# Patient Record
Sex: Female | Born: 1935
Health system: Southern US, Community
[De-identification: ages and names within clinical notes are randomized; demographics above are authoritative.]

## PROBLEM LIST (undated history)

## (undated) DIAGNOSIS — M5136 Other intervertebral disc degeneration, lumbar region: Secondary | ICD-10-CM

## (undated) DIAGNOSIS — M5410 Radiculopathy, site unspecified: Secondary | ICD-10-CM

## (undated) DIAGNOSIS — Z950 Presence of cardiac pacemaker: Secondary | ICD-10-CM

## (undated) DIAGNOSIS — M5116 Intervertebral disc disorders with radiculopathy, lumbar region: Secondary | ICD-10-CM

## (undated) DIAGNOSIS — M810 Age-related osteoporosis without current pathological fracture: Secondary | ICD-10-CM

## (undated) DIAGNOSIS — H25019 Cortical age-related cataract, unspecified eye: Secondary | ICD-10-CM

## (undated) DIAGNOSIS — D72819 Decreased white blood cell count, unspecified: Secondary | ICD-10-CM

## (undated) DIAGNOSIS — K219 Gastro-esophageal reflux disease without esophagitis: Secondary | ICD-10-CM

## (undated) DIAGNOSIS — C160 Malignant neoplasm of cardia: Secondary | ICD-10-CM

## (undated) DIAGNOSIS — R079 Chest pain, unspecified: Secondary | ICD-10-CM

## (undated) DIAGNOSIS — M5430 Sciatica, unspecified side: Secondary | ICD-10-CM

## (undated) DIAGNOSIS — K259 Gastric ulcer, unspecified as acute or chronic, without hemorrhage or perforation: Secondary | ICD-10-CM

## (undated) DIAGNOSIS — F419 Anxiety disorder, unspecified: Secondary | ICD-10-CM

## (undated) DIAGNOSIS — N183 Chronic kidney disease, stage 3 unspecified: Secondary | ICD-10-CM

## (undated) DIAGNOSIS — I10 Essential (primary) hypertension: Secondary | ICD-10-CM

## (undated) DIAGNOSIS — I4891 Unspecified atrial fibrillation: Secondary | ICD-10-CM

## (undated) DIAGNOSIS — R112 Nausea with vomiting, unspecified: Secondary | ICD-10-CM

## (undated) DIAGNOSIS — N200 Calculus of kidney: Secondary | ICD-10-CM

## (undated) DIAGNOSIS — I5030 Unspecified diastolic (congestive) heart failure: Secondary | ICD-10-CM

## (undated) DIAGNOSIS — I2510 Atherosclerotic heart disease of native coronary artery without angina pectoris: Secondary | ICD-10-CM

## (undated) DIAGNOSIS — M1990 Unspecified osteoarthritis, unspecified site: Secondary | ICD-10-CM

## (undated) DIAGNOSIS — I6529 Occlusion and stenosis of unspecified carotid artery: Secondary | ICD-10-CM

## (undated) DIAGNOSIS — Z9889 Other specified postprocedural states: Secondary | ICD-10-CM

## (undated) DIAGNOSIS — J45909 Unspecified asthma, uncomplicated: Secondary | ICD-10-CM

## (undated) DIAGNOSIS — E119 Type 2 diabetes mellitus without complications: Secondary | ICD-10-CM

## (undated) DIAGNOSIS — I739 Peripheral vascular disease, unspecified: Secondary | ICD-10-CM

## (undated) DIAGNOSIS — C4490 Unspecified malignant neoplasm of skin, unspecified: Secondary | ICD-10-CM

## (undated) DIAGNOSIS — D509 Iron deficiency anemia, unspecified: Secondary | ICD-10-CM

## (undated) DIAGNOSIS — K635 Polyp of colon: Secondary | ICD-10-CM

## (undated) DIAGNOSIS — I350 Nonrheumatic aortic (valve) stenosis: Secondary | ICD-10-CM

## (undated) DIAGNOSIS — R011 Cardiac murmur, unspecified: Secondary | ICD-10-CM

## (undated) DIAGNOSIS — Z7901 Long term (current) use of anticoagulants: Secondary | ICD-10-CM

## (undated) DIAGNOSIS — C169 Malignant neoplasm of stomach, unspecified: Secondary | ICD-10-CM

## (undated) DIAGNOSIS — D649 Anemia, unspecified: Secondary | ICD-10-CM

## (undated) DIAGNOSIS — I272 Pulmonary hypertension, unspecified: Secondary | ICD-10-CM

## (undated) DIAGNOSIS — C3491 Malignant neoplasm of unspecified part of right bronchus or lung: Secondary | ICD-10-CM

## (undated) DIAGNOSIS — D002 Carcinoma in situ of stomach: Secondary | ICD-10-CM

## (undated) DIAGNOSIS — Z952 Presence of prosthetic heart valve: Secondary | ICD-10-CM

## (undated) DIAGNOSIS — D519 Vitamin B12 deficiency anemia, unspecified: Secondary | ICD-10-CM

## (undated) DIAGNOSIS — Z889 Allergy status to unspecified drugs, medicaments and biological substances status: Secondary | ICD-10-CM

## (undated) DIAGNOSIS — R109 Unspecified abdominal pain: Secondary | ICD-10-CM

## (undated) DIAGNOSIS — Z8719 Personal history of other diseases of the digestive system: Secondary | ICD-10-CM

## (undated) DIAGNOSIS — D3610 Benign neoplasm of peripheral nerves and autonomic nervous system, unspecified: Secondary | ICD-10-CM

## (undated) DIAGNOSIS — Z8673 Personal history of transient ischemic attack (TIA), and cerebral infarction without residual deficits: Secondary | ICD-10-CM

## (undated) DIAGNOSIS — Z87442 Personal history of urinary calculi: Secondary | ICD-10-CM

## (undated) DIAGNOSIS — K2960 Other gastritis without bleeding: Secondary | ICD-10-CM

## (undated) DIAGNOSIS — D3A8 Other benign neuroendocrine tumors: Secondary | ICD-10-CM

## (undated) DIAGNOSIS — N289 Disorder of kidney and ureter, unspecified: Secondary | ICD-10-CM

## (undated) DIAGNOSIS — E785 Hyperlipidemia, unspecified: Secondary | ICD-10-CM

## (undated) DIAGNOSIS — C3431 Malignant neoplasm of lower lobe, right bronchus or lung: Secondary | ICD-10-CM

## (undated) DIAGNOSIS — D696 Thrombocytopenia, unspecified: Secondary | ICD-10-CM

## (undated) DIAGNOSIS — D508 Other iron deficiency anemias: Secondary | ICD-10-CM

## (undated) HISTORY — DX: Other iron deficiency anemias: D50.8

## (undated) HISTORY — DX: Malignant neoplasm of cardia: C16.0

## (undated) HISTORY — DX: Thrombocytopenia, unspecified: D69.6

## (undated) HISTORY — PX: VAGINAL HYSTERECTOMY: SUR661

## (undated) HISTORY — DX: Malignant neoplasm of lower lobe, right bronchus or lung: C34.31

## (undated) HISTORY — DX: Radiculopathy, site unspecified: M54.10

## (undated) HISTORY — DX: Decreased white blood cell count, unspecified: D72.819

## (undated) HISTORY — DX: Gastro-esophageal reflux disease without esophagitis: K21.9

## (undated) HISTORY — DX: Nonrheumatic aortic (valve) stenosis: I35.0

## (undated) HISTORY — PX: SHOULDER ARTHROSCOPY W/ CAPSULAR REPAIR: SHX2398

## (undated) HISTORY — PX: FRACTURE SURGERY: SHX138

## (undated) HISTORY — DX: Intervertebral disc disorders with radiculopathy, lumbar region: M51.16

## (undated) HISTORY — PX: SKIN CANCER EXCISION: SHX779

## (undated) HISTORY — PX: TUBAL LIGATION: SHX77

## (undated) HISTORY — DX: Unspecified malignant neoplasm of skin, unspecified: C44.90

## (undated) HISTORY — DX: Other intervertebral disc degeneration, lumbar region: M51.36

## (undated) HISTORY — PX: CARDIAC VALVE REPLACEMENT: SHX585

## (undated) HISTORY — DX: Atherosclerotic heart disease of native coronary artery without angina pectoris: I25.10

## (undated) HISTORY — PX: CARDIAC CATHETERIZATION: SHX172

## (undated) HISTORY — DX: Other benign neuroendocrine tumors: D3A.8

## (undated) HISTORY — DX: Hyperlipidemia, unspecified: E78.5

## (undated) HISTORY — DX: Occlusion and stenosis of unspecified carotid artery: I65.29

## (undated) HISTORY — DX: Carcinoma in situ of stomach: D00.2

## (undated) HISTORY — DX: Anemia, unspecified: D64.9

## (undated) HISTORY — PX: APPENDECTOMY: SHX54

## (undated) HISTORY — PX: COLONOSCOPY: SHX174

## (undated) HISTORY — PX: WRIST FRACTURE SURGERY: SHX121

## (undated) HISTORY — PX: ESOPHAGOGASTRODUODENOSCOPY: SHX1529

---
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
## 2004-04-18 ENCOUNTER — Emergency Department: Payer: Self-pay | Admitting: Emergency Medicine

## 2004-04-19 ENCOUNTER — Ambulatory Visit: Payer: Self-pay | Admitting: Emergency Medicine

## 2004-06-27 ENCOUNTER — Ambulatory Visit: Payer: Self-pay | Admitting: Internal Medicine

## 2005-02-16 ENCOUNTER — Ambulatory Visit: Payer: Self-pay | Admitting: Internal Medicine

## 2005-03-09 ENCOUNTER — Ambulatory Visit: Payer: Self-pay | Admitting: Internal Medicine

## 2005-04-10 ENCOUNTER — Ambulatory Visit: Payer: Self-pay | Admitting: Internal Medicine

## 2005-05-10 ENCOUNTER — Ambulatory Visit: Payer: Self-pay | Admitting: Internal Medicine

## 2005-07-19 ENCOUNTER — Ambulatory Visit: Payer: Self-pay | Admitting: Internal Medicine

## 2006-08-29 ENCOUNTER — Ambulatory Visit: Payer: Self-pay | Admitting: Internal Medicine

## 2007-09-10 ENCOUNTER — Ambulatory Visit: Payer: Self-pay | Admitting: Internal Medicine

## 2008-01-13 ENCOUNTER — Ambulatory Visit: Payer: Self-pay | Admitting: Gastroenterology

## 2008-05-13 ENCOUNTER — Emergency Department: Payer: Self-pay

## 2008-09-17 ENCOUNTER — Ambulatory Visit: Payer: Self-pay | Admitting: General Practice

## 2008-10-01 ENCOUNTER — Ambulatory Visit: Payer: Self-pay | Admitting: General Practice

## 2009-03-07 ENCOUNTER — Ambulatory Visit: Payer: Self-pay | Admitting: Internal Medicine

## 2009-03-09 ENCOUNTER — Ambulatory Visit: Payer: Self-pay | Admitting: Gynecologic Oncology

## 2009-03-10 ENCOUNTER — Ambulatory Visit: Payer: Self-pay | Admitting: Internal Medicine

## 2009-03-14 ENCOUNTER — Ambulatory Visit: Payer: Self-pay | Admitting: Gynecologic Oncology

## 2009-05-03 ENCOUNTER — Ambulatory Visit: Payer: Self-pay | Admitting: Gynecologic Oncology

## 2009-05-11 ENCOUNTER — Ambulatory Visit: Payer: Self-pay | Admitting: Unknown Physician Specialty

## 2009-05-17 ENCOUNTER — Ambulatory Visit: Payer: Self-pay | Admitting: Unknown Physician Specialty

## 2010-01-25 ENCOUNTER — Ambulatory Visit: Payer: Self-pay | Admitting: Internal Medicine

## 2011-01-30 ENCOUNTER — Ambulatory Visit: Payer: Self-pay | Admitting: Internal Medicine

## 2011-04-30 ENCOUNTER — Other Ambulatory Visit: Payer: Self-pay

## 2011-04-30 LAB — COMPREHENSIVE METABOLIC PANEL
Albumin: 4 g/dL (ref 3.4–5.0)
Alkaline Phosphatase: 72 U/L (ref 50–136)
Anion Gap: 8 (ref 7–16)
BUN: 13 mg/dL (ref 7–18)
Bilirubin,Total: 0.6 mg/dL (ref 0.2–1.0)
Calcium, Total: 9.4 mg/dL (ref 8.5–10.1)
Creatinine: 0.82 mg/dL (ref 0.60–1.30)
EGFR (African American): >60
Glucose: 114 mg/dL — ABNORMAL HIGH (ref 65–99)
Osmolality: 284 (ref 275–301)
Potassium: 4.5 mmol/L (ref 3.5–5.1)
Sodium: 142 mmol/L (ref 136–145)
Total Protein: 7.7 g/dL (ref 6.4–8.2)

## 2011-04-30 LAB — LIPASE, BLOOD: Lipase: 73 U/L (ref 73–393)

## 2011-05-04 ENCOUNTER — Ambulatory Visit: Payer: Self-pay

## 2011-05-04 ENCOUNTER — Emergency Department: Payer: Self-pay | Admitting: Emergency Medicine

## 2011-05-07 ENCOUNTER — Ambulatory Visit: Payer: Self-pay | Admitting: Gastroenterology

## 2011-05-08 ENCOUNTER — Other Ambulatory Visit: Payer: Self-pay | Admitting: Gastroenterology

## 2011-05-11 LAB — CLOSTRIDIUM DIFFICILE BY PCR

## 2011-05-22 ENCOUNTER — Ambulatory Visit: Payer: Self-pay | Admitting: Gastroenterology

## 2011-05-24 LAB — PATHOLOGY REPORT

## 2011-06-15 ENCOUNTER — Ambulatory Visit: Payer: Self-pay | Admitting: Gastroenterology

## 2011-06-19 ENCOUNTER — Ambulatory Visit: Payer: Self-pay | Admitting: Gastroenterology

## 2011-08-28 ENCOUNTER — Ambulatory Visit: Payer: Self-pay | Admitting: Gastroenterology

## 2011-08-29 LAB — PATHOLOGY REPORT

## 2011-10-04 ENCOUNTER — Ambulatory Visit: Payer: Self-pay | Admitting: Internal Medicine

## 2011-10-15 ENCOUNTER — Emergency Department: Payer: Self-pay | Admitting: Emergency Medicine

## 2011-10-15 LAB — URINALYSIS, COMPLETE
Bacteria: NONE SEEN
Blood: NEGATIVE
Ph: 8 (ref 4.5–8.0)
Protein: NEGATIVE
RBC,UR: <1 /HPF (ref 0–5)
Squamous Epithelial: <1

## 2011-10-15 LAB — COMPREHENSIVE METABOLIC PANEL
Alkaline Phosphatase: 103 U/L (ref 50–136)
Anion Gap: 8 (ref 7–16)
Bilirubin,Total: 0.8 mg/dL (ref 0.2–1.0)
Chloride: 104 mmol/L (ref 98–107)
Co2: 27 mmol/L (ref 21–32)
Creatinine: 0.71 mg/dL (ref 0.60–1.30)
EGFR (Non-African Amer.): >60
Potassium: 4.1 mmol/L (ref 3.5–5.1)
SGPT (ALT): 25 U/L

## 2011-10-15 LAB — CBC
MCH: 29.9 pg (ref 26.0–34.0)
MCHC: 34.6 g/dL (ref 32.0–36.0)
Platelet: 139 10*3/uL — ABNORMAL LOW (ref 150–440)
RBC: 4.49 10*6/uL (ref 3.80–5.20)
WBC: 7.4 10*3/uL (ref 3.6–11.0)

## 2011-10-15 LAB — LIPASE, BLOOD: Lipase: 98 U/L (ref 73–393)

## 2011-10-23 ENCOUNTER — Ambulatory Visit: Payer: Self-pay | Admitting: Gastroenterology

## 2011-10-26 ENCOUNTER — Ambulatory Visit: Payer: Self-pay | Admitting: Internal Medicine

## 2011-11-08 ENCOUNTER — Ambulatory Visit: Payer: Self-pay | Admitting: Internal Medicine

## 2011-12-21 ENCOUNTER — Ambulatory Visit: Payer: Self-pay | Admitting: Physical Medicine and Rehabilitation

## 2012-02-25 ENCOUNTER — Ambulatory Visit: Payer: Self-pay | Admitting: Internal Medicine

## 2012-04-09 ENCOUNTER — Ambulatory Visit: Payer: Self-pay | Admitting: Internal Medicine

## 2012-04-23 LAB — CBC CANCER CENTER
Basophil #: 0.1 x10 3/mm (ref 0.0–0.1)
Basophil %: 1 %
Eosinophil %: 2.1 %
HCT: 34.9 % — ABNORMAL LOW (ref 35.0–47.0)
Lymphocyte %: 31.7 %
MCH: 30.6 pg (ref 26.0–34.0)
Monocyte #: 0.3 x10 3/mm (ref 0.2–0.9)
Monocyte %: 6.3 %
Neutrophil %: 58.9 %
Platelet: 108 x10 3/mm — ABNORMAL LOW (ref 150–440)
RBC: 4 10*6/uL (ref 3.80–5.20)
RDW: 12.7 % (ref 11.5–14.5)
WBC: 5.5 x10 3/mm (ref 3.6–11.0)

## 2012-05-10 ENCOUNTER — Ambulatory Visit: Payer: Self-pay | Admitting: Internal Medicine

## 2012-05-29 LAB — CBC CANCER CENTER
Basophil %: 0.6 %
Eosinophil #: 0.1 x10 3/mm (ref 0.0–0.7)
Eosinophil %: 1.2 %
HCT: 32.6 % — ABNORMAL LOW (ref 35.0–47.0)
Lymphocyte %: 25.2 %
Monocyte %: 5.7 %
Neutrophil %: 67.3 %
Platelet: 101 x10 3/mm — ABNORMAL LOW (ref 150–440)
WBC: 5.1 x10 3/mm (ref 3.6–11.0)

## 2012-05-29 LAB — IRON AND TIBC
Iron Bind.Cap.(Total): 351 ug/dL (ref 250–450)
Iron Saturation: 25 %
Unbound Iron-Bind.Cap.: 265 ug/dL

## 2012-05-29 LAB — FERRITIN: Ferritin (ARMC): 28 ng/mL (ref 8–388)

## 2012-06-07 ENCOUNTER — Ambulatory Visit: Payer: Self-pay | Admitting: Internal Medicine

## 2012-06-20 LAB — CBC CANCER CENTER
Basophil #: 0 x10 3/mm (ref 0.0–0.1)
Basophil %: 0.7 %
Eosinophil #: 0.1 x10 3/mm (ref 0.0–0.7)
Eosinophil %: 1.5 %
MCH: 30.3 pg (ref 26.0–34.0)
MCV: 87 fL (ref 80–100)
Monocyte #: 0.2 x10 3/mm (ref 0.2–0.9)
Neutrophil %: 64.8 %
RBC: 3.68 10*6/uL — ABNORMAL LOW (ref 3.80–5.20)

## 2012-06-20 LAB — CREATININE, SERUM
Creatinine: 1.37 mg/dL — ABNORMAL HIGH (ref 0.60–1.30)
EGFR (Non-African Amer.): 38 — ABNORMAL LOW

## 2012-06-20 LAB — RETICULOCYTES: Absolute Retic Count: 0.029 10*6/uL

## 2012-06-23 LAB — PROT IMMUNOELECTROPHORES(ARMC)

## 2012-06-23 LAB — KAPPA/LAMBDA FREE LIGHT CHAINS (ARMC)

## 2012-07-08 ENCOUNTER — Ambulatory Visit: Payer: Self-pay | Admitting: Internal Medicine

## 2012-07-21 LAB — COMPREHENSIVE METABOLIC PANEL
Albumin: 3.9 g/dL (ref 3.4–5.0)
BUN: 16 mg/dL (ref 7–18)
Bilirubin,Total: 0.3 mg/dL (ref 0.2–1.0)
Chloride: 109 mmol/L — ABNORMAL HIGH (ref 98–107)
Creatinine: 0.95 mg/dL (ref 0.60–1.30)
EGFR (Non-African Amer.): 59 — ABNORMAL LOW
Osmolality: 283 (ref 275–301)
Potassium: 3.9 mmol/L (ref 3.5–5.1)
SGOT(AST): 26 U/L (ref 15–37)

## 2012-07-21 LAB — CBC CANCER CENTER
Basophil %: 0.7 %
Eosinophil #: 0.1 x10 3/mm (ref 0.0–0.7)
Eosinophil %: 1.6 %
HGB: 10.7 g/dL — ABNORMAL LOW (ref 12.0–16.0)
Lymphocyte #: 1.4 x10 3/mm (ref 1.0–3.6)
MCHC: 34.6 g/dL (ref 32.0–36.0)
MCV: 87 fL (ref 80–100)
Monocyte #: 0.3 x10 3/mm (ref 0.2–0.9)
Neutrophil #: 3.3 x10 3/mm (ref 1.4–6.5)
Platelet: 106 x10 3/mm — ABNORMAL LOW (ref 150–440)
RBC: 3.56 10*6/uL — ABNORMAL LOW (ref 3.80–5.20)

## 2012-08-07 ENCOUNTER — Ambulatory Visit: Payer: Self-pay | Admitting: Internal Medicine

## 2012-09-03 LAB — CBC CANCER CENTER
Basophil #: 0 x10 3/mm (ref 0.0–0.1)
Basophil %: 0.8 %
Eosinophil #: 0.1 x10 3/mm (ref 0.0–0.7)
Eosinophil %: 1.9 %
HGB: 11 g/dL — ABNORMAL LOW (ref 12.0–16.0)
Lymphocyte #: 1.4 x10 3/mm (ref 1.0–3.6)
Lymphocyte %: 28 %
MCH: 30.2 pg (ref 26.0–34.0)
MCHC: 34.9 g/dL (ref 32.0–36.0)
MCV: 86 fL (ref 80–100)
Monocyte %: 6.8 %
Neutrophil %: 62.5 %
RBC: 3.65 10*6/uL — ABNORMAL LOW (ref 3.80–5.20)
RDW: 13 % (ref 11.5–14.5)
WBC: 4.9 x10 3/mm (ref 3.6–11.0)

## 2012-09-03 LAB — CREATININE, SERUM
EGFR (African American): 57 — ABNORMAL LOW
EGFR (Non-African Amer.): 49 — ABNORMAL LOW

## 2012-09-07 ENCOUNTER — Ambulatory Visit: Payer: Self-pay | Admitting: Internal Medicine

## 2012-10-13 ENCOUNTER — Ambulatory Visit: Payer: Self-pay | Admitting: Gastroenterology

## 2012-10-15 LAB — PATHOLOGY REPORT

## 2012-10-27 ENCOUNTER — Ambulatory Visit: Payer: Self-pay | Admitting: Internal Medicine

## 2012-10-29 LAB — RETICULOCYTES: Absolute Retic Count: 0.073 10*6/uL

## 2012-10-29 LAB — CBC CANCER CENTER
Basophil %: 0.9 %
Eosinophil %: 1.8 %
Lymphocyte #: 1.6 x10 3/mm (ref 1.0–3.6)
Lymphocyte %: 32.4 %
MCHC: 35.9 g/dL (ref 32.0–36.0)
Monocyte #: 0.3 x10 3/mm (ref 0.2–0.9)
Neutrophil %: 58.9 %
RBC: 3.61 10*6/uL — ABNORMAL LOW (ref 3.80–5.20)
RDW: 12.7 % (ref 11.5–14.5)
WBC: 5 x10 3/mm (ref 3.6–11.0)

## 2012-11-07 ENCOUNTER — Ambulatory Visit: Payer: Self-pay | Admitting: Internal Medicine

## 2013-03-18 ENCOUNTER — Ambulatory Visit: Payer: Self-pay | Admitting: Internal Medicine

## 2013-05-29 ENCOUNTER — Ambulatory Visit: Payer: Self-pay | Admitting: Internal Medicine

## 2013-08-24 DIAGNOSIS — E785 Hyperlipidemia, unspecified: Secondary | ICD-10-CM | POA: Insufficient documentation

## 2013-08-24 HISTORY — DX: Hyperlipidemia, unspecified: E78.5

## 2013-08-28 ENCOUNTER — Ambulatory Visit: Payer: Self-pay | Admitting: Gastroenterology

## 2013-09-01 LAB — PATHOLOGY REPORT

## 2013-09-04 DIAGNOSIS — Z8679 Personal history of other diseases of the circulatory system: Secondary | ICD-10-CM

## 2013-09-04 DIAGNOSIS — I350 Nonrheumatic aortic (valve) stenosis: Secondary | ICD-10-CM

## 2013-09-04 HISTORY — DX: Personal history of other diseases of the circulatory system: Z86.79

## 2014-03-11 DIAGNOSIS — M5136 Other intervertebral disc degeneration, lumbar region: Secondary | ICD-10-CM | POA: Insufficient documentation

## 2014-03-11 DIAGNOSIS — M51369 Other intervertebral disc degeneration, lumbar region without mention of lumbar back pain or lower extremity pain: Secondary | ICD-10-CM

## 2014-03-11 DIAGNOSIS — M5410 Radiculopathy, site unspecified: Secondary | ICD-10-CM | POA: Insufficient documentation

## 2014-03-11 HISTORY — DX: Other intervertebral disc degeneration, lumbar region: M51.36

## 2014-03-11 HISTORY — DX: Other intervertebral disc degeneration, lumbar region without mention of lumbar back pain or lower extremity pain: M51.369

## 2014-03-19 ENCOUNTER — Ambulatory Visit: Payer: Self-pay | Admitting: Internal Medicine

## 2014-09-10 DIAGNOSIS — M5116 Intervertebral disc disorders with radiculopathy, lumbar region: Secondary | ICD-10-CM

## 2014-09-10 HISTORY — DX: Intervertebral disc disorders with radiculopathy, lumbar region: M51.16

## 2014-11-10 DIAGNOSIS — D696 Thrombocytopenia, unspecified: Secondary | ICD-10-CM | POA: Insufficient documentation

## 2015-02-14 ENCOUNTER — Other Ambulatory Visit: Payer: Self-pay | Admitting: Physician Assistant

## 2015-02-14 DIAGNOSIS — R911 Solitary pulmonary nodule: Secondary | ICD-10-CM

## 2015-02-16 ENCOUNTER — Encounter: Admission: RE | Payer: Self-pay | Source: Ambulatory Visit

## 2015-02-16 ENCOUNTER — Ambulatory Visit: Admission: RE | Admit: 2015-02-16 | Payer: Self-pay | Source: Ambulatory Visit | Admitting: Ophthalmology

## 2015-02-16 SURGERY — PHACOEMULSIFICATION, CATARACT, WITH IOL INSERTION
Anesthesia: Topical | Laterality: Right

## 2015-02-18 ENCOUNTER — Ambulatory Visit
Admission: RE | Admit: 2015-02-18 | Discharge: 2015-02-18 | Disposition: A | Discharge status: Home / Self Care | Payer: Medicare Other | Source: Ambulatory Visit | Attending: Physician Assistant | Admitting: Physician Assistant

## 2015-02-18 DIAGNOSIS — Z09 Encounter for follow-up examination after completed treatment for conditions other than malignant neoplasm: Secondary | ICD-10-CM | POA: Diagnosis not present

## 2015-02-18 DIAGNOSIS — I700 Atherosclerosis of aorta: Secondary | ICD-10-CM | POA: Insufficient documentation

## 2015-02-18 DIAGNOSIS — I2510 Atherosclerotic heart disease of native coronary artery without angina pectoris: Secondary | ICD-10-CM | POA: Insufficient documentation

## 2015-02-18 DIAGNOSIS — R911 Solitary pulmonary nodule: Secondary | ICD-10-CM | POA: Insufficient documentation

## 2015-02-18 HISTORY — DX: Essential (primary) hypertension: I10

## 2015-02-18 LAB — POCT I-STAT CREATININE: Creatinine, Ser: 1 mg/dL (ref 0.44–1.00)

## 2015-02-18 MED ORDER — IOHEXOL 300 MG/ML  SOLN
75.0000 mL | Freq: Once | INTRAMUSCULAR | Status: AC | PRN
  Administered 2015-02-18: 75 mL via INTRAVENOUS

## 2015-02-21 ENCOUNTER — Other Ambulatory Visit: Payer: Self-pay | Admitting: Internal Medicine

## 2015-02-21 DIAGNOSIS — R911 Solitary pulmonary nodule: Secondary | ICD-10-CM

## 2015-02-25 ENCOUNTER — Ambulatory Visit
Admission: RE | Admit: 2015-02-25 | Discharge: 2015-02-25 | Disposition: A | Discharge status: Home / Self Care | Payer: Medicare Other | Source: Ambulatory Visit | Attending: Internal Medicine | Admitting: Internal Medicine

## 2015-02-25 DIAGNOSIS — Z0189 Encounter for other specified special examinations: Secondary | ICD-10-CM | POA: Diagnosis present

## 2015-02-25 DIAGNOSIS — R911 Solitary pulmonary nodule: Secondary | ICD-10-CM | POA: Insufficient documentation

## 2015-02-25 LAB — GLUCOSE, CAPILLARY: GLUCOSE-CAPILLARY: 103 mg/dL — AB (ref 65–99)

## 2015-02-25 MED ORDER — FLUDEOXYGLUCOSE F - 18 (FDG) INJECTION
11.6500 | Freq: Once | INTRAVENOUS | Status: DC | PRN
  Administered 2015-02-25: 11.65 via INTRAVENOUS
  Filled 2015-02-25: qty 11.65

## 2015-03-24 ENCOUNTER — Inpatient Hospital Stay: Payer: Medicare Other | Attending: Cardiothoracic Surgery | Admitting: Cardiothoracic Surgery

## 2015-03-24 ENCOUNTER — Encounter: Payer: Self-pay | Admitting: Cardiothoracic Surgery

## 2015-03-24 VITALS — BP 174/76 | HR 78 | Temp 97.7°F | Wt 128.3 lb

## 2015-03-24 DIAGNOSIS — K219 Gastro-esophageal reflux disease without esophagitis: Secondary | ICD-10-CM | POA: Insufficient documentation

## 2015-03-24 DIAGNOSIS — E119 Type 2 diabetes mellitus without complications: Secondary | ICD-10-CM | POA: Insufficient documentation

## 2015-03-24 DIAGNOSIS — D649 Anemia, unspecified: Secondary | ICD-10-CM | POA: Insufficient documentation

## 2015-03-24 DIAGNOSIS — I10 Essential (primary) hypertension: Secondary | ICD-10-CM | POA: Diagnosis not present

## 2015-03-24 DIAGNOSIS — Z85828 Personal history of other malignant neoplasm of skin: Secondary | ICD-10-CM | POA: Diagnosis not present

## 2015-03-24 DIAGNOSIS — S2239XS Fracture of one rib, unspecified side, sequela: Secondary | ICD-10-CM | POA: Diagnosis not present

## 2015-03-24 DIAGNOSIS — Z9861 Coronary angioplasty status: Secondary | ICD-10-CM | POA: Insufficient documentation

## 2015-03-24 DIAGNOSIS — I2510 Atherosclerotic heart disease of native coronary artery without angina pectoris: Secondary | ICD-10-CM | POA: Insufficient documentation

## 2015-03-24 DIAGNOSIS — R011 Cardiac murmur, unspecified: Secondary | ICD-10-CM | POA: Insufficient documentation

## 2015-03-24 DIAGNOSIS — E538 Deficiency of other specified B group vitamins: Secondary | ICD-10-CM | POA: Insufficient documentation

## 2015-03-24 DIAGNOSIS — Z79899 Other long term (current) drug therapy: Secondary | ICD-10-CM | POA: Insufficient documentation

## 2015-03-24 DIAGNOSIS — Z7984 Long term (current) use of oral hypoglycemic drugs: Secondary | ICD-10-CM | POA: Insufficient documentation

## 2015-03-24 DIAGNOSIS — F17210 Nicotine dependence, cigarettes, uncomplicated: Secondary | ICD-10-CM | POA: Insufficient documentation

## 2015-03-24 DIAGNOSIS — R55 Syncope and collapse: Secondary | ICD-10-CM | POA: Insufficient documentation

## 2015-03-24 DIAGNOSIS — R918 Other nonspecific abnormal finding of lung field: Secondary | ICD-10-CM | POA: Diagnosis not present

## 2015-03-24 DIAGNOSIS — E1122 Type 2 diabetes mellitus with diabetic chronic kidney disease: Secondary | ICD-10-CM | POA: Insufficient documentation

## 2015-03-24 DIAGNOSIS — D3A8 Other benign neuroendocrine tumors: Secondary | ICD-10-CM

## 2015-03-24 DIAGNOSIS — D72819 Decreased white blood cell count, unspecified: Secondary | ICD-10-CM | POA: Insufficient documentation

## 2015-03-24 HISTORY — DX: Deficiency of other specified B group vitamins: E53.8

## 2015-03-24 HISTORY — DX: Atherosclerotic heart disease of native coronary artery without angina pectoris: I25.10

## 2015-03-24 HISTORY — DX: Other benign neuroendocrine tumors: D3A.8

## 2015-03-24 HISTORY — DX: Gastro-esophageal reflux disease without esophagitis: K21.9

## 2015-03-24 NOTE — Progress Notes (Signed)
Patient ID: Stacy Cook, female   DOB: 10/23/1935, 79 y.o.   MRN: [id number]  No chief complaint on file.   Referred By Kerrin Mo Reason for Referral right lower lobe mass  HPI Location, Quality, Duration, Severity, Timing, Context, Modifying Factors, Associated Signs and Symptoms.  Stacy Cook is a 79 y.o. female.  This delightful 79 year old female was in her usual state of health until she experienced an episode of fainting. She states that she fell and injured the right side of her chest wall. This was several weeks ago and then initial chest x-ray showed a right lower lobe mass. This was confirmed with a CT scan and a subsequent PET scan. All these revealed a 2 cm superior segment spiculated right lower lobe mass that was most concerning for malignancy. Of note is that the patient is a lifelong nonsmoker. She has no pulmonary symptoms whatsoever. She is able to do anything she would like to do. She does have a history of a heart murmur and she's been followed by Dr. Towanda Malkin for that. She does not remember when she had her last echocardiogram or pulmonary function tests. She's had no fevers chills or weight loss. She's had no cough. Her biggest complaint is that she has some musculoskeletal pain secondary to her rib fracture. Of note is that she is also seen an ophthalmologist for cataract surgery but this is been put on hold while she works upper lung mass.   Past Medical History  Diagnosis Date  . Diabetes mellitus without complication ([place])   . Hypertension   . Skin cancer   . Cataract     No past surgical history on file.  No family history on file.  Social History Social History  Substance Use Topics  . Smoking status: Not on file  . Smokeless tobacco: Not on file  . Alcohol Use: Not on file    Allergies  Allergen Reactions  . Statins     Joint pain  . Sulfa Antibiotics Nausea And Vomiting  . Pneumococcal Vaccine Itching    Hives and fever    Current  Outpatient Prescriptions  Medication Sig Dispense Refill  . cholecalciferol (VITAMIN D) 1000 UNITS tablet Take 1,000 Units by mouth daily.    . cyanocobalamin (V-R VITAMIN B-12) 500 MCG tablet Take by mouth.    Derrill Memo ON 06/01/2015] HYDROcodone-acetaminophen (NORCO) 7.5-325 MG tablet 1 po qid prn Earliest Fill Date: 06/01/15    . losartan (COZAAR) 100 MG tablet Take 100 mg by mouth.    . metFORMIN (GLUCOPHAGE) 500 MG tablet Take 500 mg by mouth.    . Omega-3 Fatty Acids (FISH OIL) 1000 MG CAPS Take by mouth.     No current facility-administered medications for this visit.      Review of Systems A complete review of systems was asked and was negative except for the following positive findings sinus drainage, heart murmur, arthritis,  Blood pressure 174/76, pulse 78, temperature 97.7 F (36.5 C), weight 128 lb 4.9 oz (58.2 kg), SpO2 98 %.  Physical Exam CONSTITUTIONAL:  Pleasant, well-developed, well-nourished, and in no acute distress. EYES: Pupils equal and reactive to light, Sclera non-icteric EARS, NOSE, MOUTH AND THROAT:  The oropharynx was clear.  Dentition is good repair.  Oral mucosa pink and moist. LYMPH NODES:  Lymph nodes in the neck and axillae were normal RESPIRATORY:  Lungs were clear.  Normal respiratory effort without pathologic use of accessory muscles of respiration CARDIOVASCULAR: Heart was regular with loud  systolic murmurs.  There were no carotid bruits but transmitted murmur GI: The abdomen was soft, nontender, and nondistended. There were no palpable masses. There was no hepatosplenomegaly. There were normal bowel sounds in all quadrants. GU:  Rectal deferred.   MUSCULOSKELETAL:  Normal muscle strength and tone.  No clubbing or cyanosis.   SKIN:  There were no pathologic skin lesions.  There were no nodules on palpation. NEUROLOGIC:  Sensation is normal.  Cranial nerves are grossly intact. PSYCH:  Oriented to person, place and time.  Mood and affect are  normal.  Data Reviewed CT scan and PET scan  I have personally reviewed the patient's imaging, laboratory findings and medical records.    Assessment    I have independently reviewed the CT scan and PET scan. The lesion in the lung is concerning for malignancy although this certainly could represent a more benign process. However given the results of do believe that surgical resection is indicated. I also discussed with her the option of CT-guided needle biopsy. I do believe that she would be a reasonable candidate for surgery and of recommended that to her.    Plan    She will have a complete set of pulmonary function studies made as well as a follow-up visit with Dr. Towanda Malkin. She'll come back to see me once that's complete.       Nestor Lewandowsky, MD 03/24/2015, 12:05 PM

## 2015-03-31 ENCOUNTER — Ambulatory Visit: Payer: Medicare Other | Attending: Cardiothoracic Surgery

## 2015-03-31 DIAGNOSIS — R0600 Dyspnea, unspecified: Secondary | ICD-10-CM | POA: Diagnosis not present

## 2015-03-31 DIAGNOSIS — R918 Other nonspecific abnormal finding of lung field: Secondary | ICD-10-CM

## 2015-03-31 MED ORDER — ALBUTEROL SULFATE (2.5 MG/3ML) 0.083% IN NEBU
2.5000 mg | INHALATION_SOLUTION | Freq: Once | RESPIRATORY_TRACT | Status: AC
  Administered 2015-03-31: 2.5 mg via RESPIRATORY_TRACT
  Filled 2015-03-31: qty 3

## 2015-04-07 ENCOUNTER — Inpatient Hospital Stay: Payer: Medicare Other | Admitting: Cardiothoracic Surgery

## 2015-04-14 ENCOUNTER — Ambulatory Visit: Payer: Medicare Other | Admitting: Cardiothoracic Surgery

## 2015-04-14 ENCOUNTER — Inpatient Hospital Stay: Payer: Medicare Other | Admitting: Cardiothoracic Surgery

## 2015-04-15 ENCOUNTER — Other Ambulatory Visit: Payer: Self-pay | Admitting: Vascular Surgery

## 2015-04-15 DIAGNOSIS — I6523 Occlusion and stenosis of bilateral carotid arteries: Secondary | ICD-10-CM

## 2015-04-21 ENCOUNTER — Inpatient Hospital Stay: Payer: Medicare Other | Admitting: Cardiothoracic Surgery

## 2015-04-26 ENCOUNTER — Ambulatory Visit
Admission: RE | Admit: 2015-04-26 | Discharge: 2015-04-26 | Disposition: A | Discharge status: Home / Self Care | Payer: Medicare Other | Source: Ambulatory Visit | Attending: Vascular Surgery | Admitting: Vascular Surgery

## 2015-04-26 DIAGNOSIS — I700 Atherosclerosis of aorta: Secondary | ICD-10-CM | POA: Insufficient documentation

## 2015-04-26 DIAGNOSIS — I6523 Occlusion and stenosis of bilateral carotid arteries: Secondary | ICD-10-CM | POA: Diagnosis present

## 2015-04-26 DIAGNOSIS — R911 Solitary pulmonary nodule: Secondary | ICD-10-CM | POA: Diagnosis not present

## 2015-04-26 DIAGNOSIS — I6522 Occlusion and stenosis of left carotid artery: Secondary | ICD-10-CM | POA: Insufficient documentation

## 2015-04-26 MED ORDER — IOHEXOL 350 MG/ML SOLN
80.0000 mL | Freq: Once | INTRAVENOUS | Status: AC | PRN
Start: 2015-04-26 — End: 2015-04-26
  Administered 2015-04-26: 80 mL via INTRAVENOUS

## 2015-04-28 ENCOUNTER — Inpatient Hospital Stay: Payer: Medicare Other | Attending: Cardiothoracic Surgery | Admitting: Cardiothoracic Surgery

## 2015-04-28 ENCOUNTER — Encounter: Payer: Self-pay | Admitting: Cardiothoracic Surgery

## 2015-04-28 ENCOUNTER — Telehealth: Payer: Self-pay | Admitting: *Deleted

## 2015-04-28 VITALS — BP 151/66 | HR 71 | Temp 98.0°F | Ht 61.0 in | Wt 128.9 lb

## 2015-04-28 DIAGNOSIS — R918 Other nonspecific abnormal finding of lung field: Secondary | ICD-10-CM

## 2015-04-28 DIAGNOSIS — I2510 Atherosclerotic heart disease of native coronary artery without angina pectoris: Secondary | ICD-10-CM | POA: Insufficient documentation

## 2015-04-28 NOTE — Telephone Encounter (Signed)
Called patient to notify her of lab appointment tomorrow morning.

## 2015-04-28 NOTE — Progress Notes (Signed)
Stacy Cook Inpatient Post-Op Note  Patient ID: Stacy Cook, female   DOB: 1935/12/09, 80 y.o.   MRN: [id number]  HISTORY: This patient returns today in follow-up. She was seen by Dr. Towanda Malkin and an extensive preoperative evaluation has been undertaken. We will work with our coordinator to obtain those records. Essentially the patient has a history of coronary artery disease as well as aortic stenosis. However she denies any syncope or near syncope. She denied any chest pain. In addition she was found to have significant bilateral carotid bruits right louder than left and is undergone a neck CTA. She is scheduled to see Dr. dew next week. She's not had any stroke or TIA symptoms. Of note is that on the neck CTA the lung mass was identified and was stable in size. The patient does not complain of any significant shortness of breath at this time. She denies any fevers or chills.   Filed Vitals:   04/28/15 0910  BP: 151/66  Pulse: 71  Temp: 98 F (36.7 C)     EXAM: Resp: Lungs are clear bilaterally.  No respiratory distress, normal effort. Heart:  Regular with loud murmur.  There are bilateral carotid bruits. The right is louder than the left. In addition there is a bruit heard throughout the abdomen and particularly posteriorly in the left upper quadrant Abd:  Abdomen is soft, non distended and non tender. No masses are palpable.  There is no rebound and no guarding.  Neurological: Alert and oriented to person, place, and time. Coordination normal.  Skin: Skin is warm and dry. No rash noted. No diaphoretic. No erythema. No pallor.  Psychiatric: Normal mood and affect. Normal behavior. Judgment and thought content normal.    ASSESSMENT: I reviewed the patient's pulmonary function studies. Her FEV1 is 110% of the affected. Her DLCO is 90% predicted. I have reviewed Dr. Telford Nab note and believe that he does not see any contraindications to surgery. In addition the patient scheduled to see  Dr. dew next week.   PLAN:   I have reviewed with the patient the indications and risks of surgical resection. She does not wish to pursue radiation therapy or chemotherapy. She does not wish to pursue a biopsy. Because of this we've discussed the indications and risks of surgery. Risks of bleeding, infection, air leak and death were all reviewed. A 5% operative mortality was quoted. We will obtain some routine blood work today. Preoperative orders were written.    Nestor Lewandowsky, MD

## 2015-04-29 ENCOUNTER — Telehealth: Payer: Self-pay | Admitting: Cardiothoracic Surgery

## 2015-04-29 ENCOUNTER — Inpatient Hospital Stay: Payer: Medicare Other

## 2015-04-29 DIAGNOSIS — R918 Other nonspecific abnormal finding of lung field: Secondary | ICD-10-CM | POA: Diagnosis not present

## 2015-04-29 LAB — CBC WITH DIFFERENTIAL/PLATELET
BASOS PCT: 1 %
Basophils Absolute: 0 10*3/uL (ref 0–0.1)
Eosinophils Absolute: 0.1 10*3/uL (ref 0–0.7)
Eosinophils Relative: 2 %
HEMATOCRIT: 35.7 % (ref 35.0–47.0)
HEMOGLOBIN: 12.1 g/dL (ref 12.0–16.0)
LYMPHS ABS: 1.5 10*3/uL (ref 1.0–3.6)
Lymphocytes Relative: 26 %
MCH: 28.8 pg (ref 26.0–34.0)
MCHC: 33.8 g/dL (ref 32.0–36.0)
MCV: 85.3 fL (ref 80.0–100.0)
MONO ABS: 0.4 10*3/uL (ref 0.2–0.9)
MONOS PCT: 6 %
NEUTROS ABS: 3.8 10*3/uL (ref 1.4–6.5)
NEUTROS PCT: 65 %
Platelets: 114 10*3/uL — ABNORMAL LOW (ref 150–440)
RBC: 4.18 MIL/uL (ref 3.80–5.20)
RDW: 13 % (ref 11.5–14.5)
WBC: 5.8 10*3/uL (ref 3.6–11.0)

## 2015-04-29 LAB — COMPREHENSIVE METABOLIC PANEL
ALBUMIN: 4.1 g/dL (ref 3.5–5.0)
ALK PHOS: 75 U/L (ref 38–126)
ALT: 17 U/L (ref 14–54)
ANION GAP: 6 (ref 5–15)
AST: 21 U/L (ref 15–41)
BUN: 17 mg/dL (ref 6–20)
CALCIUM: 9 mg/dL (ref 8.9–10.3)
CHLORIDE: 106 mmol/L (ref 101–111)
CO2: 27 mmol/L (ref 22–32)
CREATININE: 1.01 mg/dL — AB (ref 0.44–1.00)
GFR calc Af Amer: >60 mL/min (ref >60)
GFR calc non Af Amer: 52 mL/min — ABNORMAL LOW (ref >60)
GLUCOSE: 107 mg/dL — AB (ref 65–99)
Potassium: 4.5 mmol/L (ref 3.5–5.1)
SODIUM: 139 mmol/L (ref 135–145)
Total Bilirubin: 0.7 mg/dL (ref 0.3–1.2)
Total Protein: 7.3 g/dL (ref 6.5–8.1)

## 2015-04-29 LAB — PROTIME-INR
INR: 1
PROTHROMBIN TIME: 13.4 s (ref 11.4–15.0)

## 2015-04-29 LAB — APTT: aPTT: 30 seconds (ref 24–36)

## 2015-04-29 NOTE — Telephone Encounter (Signed)
Pt advised of pre op date/time and sx date. Sx: 05/09/15 with Dr Tedra Senegal op bronchoscopy with right thoracotomy and lung resection.  Pre op: 05/09/15 at 8:15am--office.

## 2015-05-02 ENCOUNTER — Other Ambulatory Visit: Payer: Medicare Other

## 2015-05-03 ENCOUNTER — Encounter
Admission: RE | Admit: 2015-05-03 | Discharge: 2015-05-03 | Disposition: A | Discharge status: Home / Self Care | Payer: Medicare Other | Source: Ambulatory Visit | Attending: Cardiothoracic Surgery | Admitting: Cardiothoracic Surgery

## 2015-05-03 ENCOUNTER — Ambulatory Visit
Admission: RE | Admit: 2015-05-03 | Discharge: 2015-05-03 | Disposition: A | Discharge status: Home / Self Care | Payer: Medicare Other | Source: Ambulatory Visit | Attending: Cardiothoracic Surgery | Admitting: Cardiothoracic Surgery

## 2015-05-03 DIAGNOSIS — R011 Cardiac murmur, unspecified: Secondary | ICD-10-CM | POA: Diagnosis not present

## 2015-05-03 DIAGNOSIS — I10 Essential (primary) hypertension: Secondary | ICD-10-CM | POA: Diagnosis not present

## 2015-05-03 DIAGNOSIS — R918 Other nonspecific abnormal finding of lung field: Secondary | ICD-10-CM | POA: Diagnosis present

## 2015-05-03 HISTORY — DX: Anemia, unspecified: D64.9

## 2015-05-03 HISTORY — DX: Age-related osteoporosis without current pathological fracture: M81.0

## 2015-05-03 HISTORY — DX: Unspecified osteoarthritis, unspecified site: M19.90

## 2015-05-03 LAB — COMPREHENSIVE METABOLIC PANEL
ALBUMIN: 4.3 g/dL (ref 3.5–5.0)
ALT: 14 U/L (ref 14–54)
AST: 21 U/L (ref 15–41)
Alkaline Phosphatase: 71 U/L (ref 38–126)
Anion gap: 7 (ref 5–15)
BUN: 19 mg/dL (ref 6–20)
CHLORIDE: 108 mmol/L (ref 101–111)
CO2: 25 mmol/L (ref 22–32)
CREATININE: 0.85 mg/dL (ref 0.44–1.00)
Calcium: 9.6 mg/dL (ref 8.9–10.3)
GFR calc Af Amer: >60 mL/min (ref >60)
GLUCOSE: 92 mg/dL (ref 65–99)
Potassium: 4.2 mmol/L (ref 3.5–5.1)
Sodium: 140 mmol/L (ref 135–145)
Total Bilirubin: 0.8 mg/dL (ref 0.3–1.2)
Total Protein: 7.5 g/dL (ref 6.5–8.1)

## 2015-05-03 LAB — BLOOD GAS, ARTERIAL
Acid-base deficit: 0.2 mmol/L (ref 0.0–2.0)
Allens test (pass/fail): POSITIVE — AB
Bicarbonate: 24 mEq/L (ref 21.0–28.0)
FIO2: 0.21
O2 SAT: 96.7 %
PCO2 ART: 37 mmHg (ref 32.0–48.0)
PH ART: 7.42 (ref 7.350–7.450)
PO2 ART: 86 mmHg (ref 83.0–108.0)
Patient temperature: 37

## 2015-05-03 LAB — PREPARE RBC (CROSSMATCH)

## 2015-05-03 LAB — CBC
HCT: 36.3 % (ref 35.0–47.0)
Hemoglobin: 12 g/dL (ref 12.0–16.0)
MCH: 28.1 pg (ref 26.0–34.0)
MCHC: 33.1 g/dL (ref 32.0–36.0)
MCV: 85 fL (ref 80.0–100.0)
PLATELETS: 108 10*3/uL — AB (ref 150–440)
RBC: 4.27 MIL/uL (ref 3.80–5.20)
RDW: 12.9 % (ref 11.5–14.5)
WBC: 6.3 10*3/uL (ref 3.6–11.0)

## 2015-05-03 LAB — APTT: aPTT: <24 seconds — ABNORMAL LOW (ref 24–36)

## 2015-05-03 LAB — SURGICAL PCR SCREEN
MRSA, PCR: NEGATIVE
Staphylococcus aureus: NEGATIVE

## 2015-05-03 LAB — ABO/RH: ABO/RH(D): O POS

## 2015-05-03 LAB — PROTIME-INR
INR: 0.99
Prothrombin Time: 13.3 seconds (ref 11.4–15.0)

## 2015-05-03 NOTE — Pre-Procedure Instructions (Signed)
04/15/2015  [place]  Result Impression    Normal myocardial perfusion scan no evidence of  stress-induced myocardial ischemia ejection fraction of 69% conclusion  negative scan   Result Narrative  Procedure: Pharmacologic Myocardial Perfusion Imaging   ONE day procedure  Indication: Pre-op examination - Plan: NM myocardial perfusion SPECT  multiple (stress and rest), ECG stress test only Ordering Physician:   Dr. Lujean Amel   Clinical History: 80 y.o. year old female recent anginal symptoms Vitals: Height: 62 in Weight: 129 lb Cardiac risk factors include:    AS, Hyperlipidemia, Diabetes, HTN and CAD    Procedure:  Pharmacologic stress testing was performed with Regadenoson using a single  use 0.'4mg'$ /63m (0.08 mg/ml) prefilled syringe intravenously infused as a  bolus dose. The stress test was stopped due to Infusion completion.  Blood  pressure response was normal.    Rest HR: 65bpm Rest BP: 148/623mg Max HR: 111bpm Min BP: 150/7019m  Stress Test Administered by: DINOswald HillockMA  ECG Interpretation: Rest ECG:  normal sinus rhythm, Nonspecific ST T wave changes Stress ECG:  normal sinus rhythm, diffuse ST segments depression Recovery ECG:  normal sinus rhythm ECG Interpretation:  positive.   Administrations This Visit    regadenoson (LEXISCAN) 0.4 mg/5 mL inj syringe 0.4 mg    Admin Date Action Dose Route Administered By      04/15/2015 Given 0.4 mg Intravenous BryRennie NatterNMT            technetium Tc99m56mtamibi (CARDIOLITE) injection 13.973.4licurie    Admin Date Action Dose Route Administered By      01/028/76/8115en 13.972.6licurie Intravenous BryaRennie NatterMT            technetium Tc99m 55mamibi (CARDIOLITE) injection 33.6620.355icurie    Admin Date Action Dose Route Administered By      04/15/2015 Given 33.6697.416icurie Intravenous Joshua B Hyler, CNMT              Gated post-stress perfusion  imaging was performed 30 minutes after stress.  Rest images were performed 30 minutes after injection.  Gated LV Analysis:  TID Ratio: 1.29  LVEF= 69 %  FINDINGS: Regional wall motion:  reveals normal myocardial thickening and wall  motion. The overall quality of the study is good.   Artifacts noted: no Left ventricular cavity: normal.  Perfusion Analysis:  SPECT images demonstrate homogeneous tracer  distribution throughout the myocardium.     Status Results Details   Encounter Summary  ECG stress test only1/09/2015  [facility]

## 2015-05-03 NOTE — Patient Instructions (Signed)
  Your procedure is scheduled on: 05/09/15 Mon  Report to Day Surgery. To find out your arrival time please call [phone] between 1PM - 3PM on 05/06/15 Fri Remember: Instructions that are not followed completely may result in serious medical risk, up to and including death, or upon the discretion of your surgeon and anesthesiologist your surgery may need to be rescheduled.    _x_ 1. Do not eat food or drink liquids after midnight. No gum chewing or hard candies.     ____ 2. No Alcohol for 24 hours before or after surgery.   ____ 3. Bring all medications with you on the day of surgery if instructed.    __x__ 4. Notify your doctor if there is any change in your medical condition     (cold, fever, infections).     Do not wear jewelry, make-up, hairpins, clips or nail polish.  Do not wear lotions, powders, or perfumes. You may wear deodorant.  Do not shave 48 hours prior to surgery. Men may shave face and neck.  Do not bring valuables to the hospital.    [facility] is not responsible for any belongings or valuables.               Contacts, dentures or bridgework may not be worn into surgery.  Leave your suitcase in the car. After surgery it may be brought to your room.  For patients admitted to the hospital, discharge time is determined by your                treatment team.   Patients discharged the day of surgery will not be allowed to drive home.   Please read over the following fact sheets that you were given:   MRSA Information   _x___ Take these medicines the morning of surgery with A SIP OF WATER:    1. losartan (COZAAR) 100 MG tablet  2.   3.   4.  5.  6.  ____ Fleet Enema (as directed)   _x___ Use CHG Soap as directed  ____ Use inhalers on the day of surgery  _x__ Stop metformin 2 days prior to surgery    ____ Take 1/2 of usual insulin dose the night before surgery and none on the morning of surgery.   _x___ Stop Coumadin/Plavix/aspirin on Stop Aspirin  today  ____ Stop Anti-inflammatories on    ____ Stop supplements until after surgery.    ____ Bring C-Pap to the hospital.

## 2015-05-03 NOTE — Pre-Procedure Instructions (Signed)
Copy of EKG sent to Dr Clayborn Bigness

## 2015-05-03 NOTE — Pre-Procedure Instructions (Signed)
CLEARED  BY DR CALLWOOD AND NOTE UNDER PAT NOTES

## 2015-05-03 NOTE — Pre-Procedure Instructions (Signed)
Health System  Component Name Value Range  LV Ejection Fraction (%) 55   Aortic Valve Regurgitation Grade mild   Aortic Valve Stenosis Grade moderate   Aortic Valve Max Velocity (m/s) 3.4 m/sec   Aortic Valve Stenosis Mean Gradient (mmHg) 24.7 mmHg   Mitral Valve Regurgitation Grade mild   Mitral Valve Stenosis Grade none   Tricuspid Valve Regurgitation Grade mild   Tricuspid Valve Regurgitation Max Velocity (m/s) 1.8 m/sec   Right Ventricle Systolic Pressure (mmHg) 53.9 mmHg   LV End Diastolic Diameter (cm) 3.6 cm  LV End Systolic Diameter (cm) 2.6 cm  LV Septum Wall Thickness (cm) 1.5 cm  LV Posterior Wall Thickness (cm) 1.5 cm  Left Atrium Diameter (cm) 3.5 cm   Result Narrative                    CARDIOLOGY DEPARTMENT                      [facility]                         [id number]                  [facility]                    Acct #: [id number]        [address], [place], [place] [address]          Date: 04/15/2015 09:15 AM                                                              Adult Female Age: 80 yrs                   ECHOCARDIOGRAM REPORT                      Outpatient         STUDY:CHEST WALL         TAPE:0000:00: 0:00:00       KC::KCWC          ECHO:Yes   DOPPLER:Yes  FILE:[id number]           MD1:  CALLWOOD, DWAYNE DENNIS         COLOR:Yes  CONTRAST:No       MACHINE:[place]     RV BIOPSY:No         3D:No    SOUND QLTY:Moderate        MEDIUM:None ___________________________________________________________________________________________               HISTORY:Murmur, Valvular disease               REASON:Assess, LV function           INDICATION:R01.1 Cardiac murmur, unspecified ___________________________________________________________________________________________  ECHOCARDIOGRAPHIC MEASUREMENTS 2D DIMENSIONS AORTA             Values      Normal Range      MAIN PA          Values  Normal  Range           Annulus:  nm*       [2.1 - 2.5]                PA Main:  nm*       [1.5 - 2.1]         Aorta Sin:  nm*       [2.7 - 3.3]       RIGHT VENTRICLE       ST Junction:  nm*       [2.3 - 2.9]                RV Base:  3.2 cm    [ < 4.2]         Asc.Aorta:  nm*       [2.3 - 3.1]                 RV Mid:  nm*       [ < 3.5]  LEFT VENTRICLE                                        RV Length:  nm*       [ < 8.6]             LVIDd:  3.6 cm    [3.9 - 5.3]       INFERIOR VENA CAVA             LVIDs:  2.6 cm                              Max. IVC:  nm*       [ <= 2.1]                FS:  26.3 %    [> 25]                    Min. IVC:  nm*               SWT:  1.5 cm    [0.5 - 0.9]                   ------------------               PWT:  1.5 cm    [0.5 - 0.9]                   nm* - not measured  LEFT ATRIUM           LA Diam:  3.5 cm    [2.7 - 3.8]       LA A4C Area:  nm*       [ < 20]         LA Volume:  nm*       [22 - 52] ___________________________________________________________________________________________  ECHOCARDIOGRAPHIC DESCRIPTIONS  AORTIC ROOT         Size:Normal   Dissection:INDETERM FOR DISSECTION  AORTIC VALVE     Leaflets:Tricuspid         Morphology:MODERATELY THICKENED     Mobility:PARTIALLY MOBILE  LEFT VENTRICLE         Size:Normal              Anterior:Normal  Contraction:Normal  Lateral:Normal   Closest EF:>55% (Estimated)      Septal:Normal    LV Masses:No Masses             Apical:Normal          KGU:RKYHCWCB LVH        Inferior:Normal                                 Posterior:Normal Dias.FxClass:(Grade 2) relaxation abnormal, pseudonormal  MITRAL VALVE     Leaflets:Normal              Mobility:Fully mobile   Morphology:ANNULAR CALC  LEFT ATRIUM         Size:Normal             LA Masses:No masses    IA Septum:Normal IAS  MAIN PA         Size:Normal  PULMONIC VALVE   Morphology:Normal              Mobility:Fully mobile  RIGHT  VENTRICLE    RV Masses:No Masses               Size:Normal    Free Wall:Normal           Contraction:Normal  TRICUSPID VALVE     Leaflets:Normal              Mobility:Fully mobile   Morphology:Normal  RIGHT ATRIUM         Size:Normal              RA Other:None      RA Mass:No masses  PERICARDIUM        Fluid:No effusion  INFERIOR VENACAVA         Size:Normal Normal respiratory collapse  ____________________________________________________________________  DOPPLER ECHO and OTHER SPECIAL PROCEDURES    Aortic:MILD AR                       MODERATE AS           339.0 cm/sec peak vel         46.0 mmHg peak grad           24.7 mmHg mean grad           0.82 cm^2 by DOPPLER     Mitral:MILD MR                       No MS                                         3.0 cm^2 by DOPPLER           MV Inflow E Vel=87.9 cm/sec   MV Annulus E'Vel=3.9 cm/sec           E/E'Ratio=22.5  Tricuspid:MILD TR                       No TS           179.0 cm/sec peak TR vel      17.8 mmHg peak RV pressure  Pulmonary:TRIVIAL PR                    No PS    ___________________________________________________________________________________________ INTERPRETATION NORMAL LEFT VENTRICULAR SYSTOLIC FUNCTION WITH AN ESTIMATED EF = >55 %  NORMAL RIGHT VENTRICULAR SYSTOLIC FUNCTION MILD TRICUSPID, MITRAL AND AORTIC VALVE INSUFFICIENCY MODERATE AORTIC VALVE STENOSIS WITH A CALCULATED AVA = 0.82 cm^2 BY CONTINUITY EQUATION MODERATE LVH   ___________________________________________________________________________________________ Electronically signed by: Lujean Amel, MD on 04/19/2015 12:18 PM             Performed By: Johnathan Hausen, Bishop, RVT       Ordering Physician: Lujean Amel  ___________________________________________________________________________________________

## 2015-05-06 NOTE — Pre-Procedure Instructions (Signed)
EKG reviewed by Dr Ronelle Nigh, requesting Dr Clayborn Bigness review and advise patient still clear for surgery. Spoke to Shelby at Dr [facility] office and she will show MD the EKG and fax back on Monday as he is out of office at this time.

## 2015-05-08 LAB — PREPARE RBC (CROSSMATCH)

## 2015-05-09 ENCOUNTER — Inpatient Hospital Stay: Payer: Medicare Other

## 2015-05-09 ENCOUNTER — Inpatient Hospital Stay: Payer: Medicare Other | Admitting: Certified Registered Nurse Anesthetist

## 2015-05-09 ENCOUNTER — Encounter: Payer: Self-pay | Admitting: *Deleted

## 2015-05-09 ENCOUNTER — Encounter
Admission: RE | Disposition: A | Discharge status: Home / Self Care | Payer: Self-pay | Source: Ambulatory Visit | Attending: Cardiothoracic Surgery

## 2015-05-09 ENCOUNTER — Inpatient Hospital Stay
Admission: RE | Admit: 2015-05-09 | Discharge: 2015-05-18 | DRG: 164 | Disposition: A | Discharge status: Home / Self Care | Payer: Medicare Other | Source: Ambulatory Visit | Attending: Cardiothoracic Surgery | Admitting: Cardiothoracic Surgery

## 2015-05-09 DIAGNOSIS — K219 Gastro-esophageal reflux disease without esophagitis: Secondary | ICD-10-CM | POA: Diagnosis present

## 2015-05-09 DIAGNOSIS — C3491 Malignant neoplasm of unspecified part of right bronchus or lung: Secondary | ICD-10-CM

## 2015-05-09 DIAGNOSIS — Z9889 Other specified postprocedural states: Secondary | ICD-10-CM

## 2015-05-09 DIAGNOSIS — M810 Age-related osteoporosis without current pathological fracture: Secondary | ICD-10-CM | POA: Diagnosis present

## 2015-05-09 DIAGNOSIS — I2510 Atherosclerotic heart disease of native coronary artery without angina pectoris: Secondary | ICD-10-CM | POA: Diagnosis present

## 2015-05-09 DIAGNOSIS — D62 Acute posthemorrhagic anemia: Secondary | ICD-10-CM | POA: Diagnosis not present

## 2015-05-09 DIAGNOSIS — R918 Other nonspecific abnormal finding of lung field: Secondary | ICD-10-CM | POA: Diagnosis present

## 2015-05-09 DIAGNOSIS — E119 Type 2 diabetes mellitus without complications: Secondary | ICD-10-CM | POA: Diagnosis present

## 2015-05-09 DIAGNOSIS — C3431 Malignant neoplasm of lower lobe, right bronchus or lung: Secondary | ICD-10-CM

## 2015-05-09 DIAGNOSIS — J9382 Other air leak: Secondary | ICD-10-CM | POA: Diagnosis not present

## 2015-05-09 DIAGNOSIS — I10 Essential (primary) hypertension: Secondary | ICD-10-CM | POA: Diagnosis present

## 2015-05-09 DIAGNOSIS — D696 Thrombocytopenia, unspecified: Secondary | ICD-10-CM | POA: Diagnosis not present

## 2015-05-09 DIAGNOSIS — Z09 Encounter for follow-up examination after completed treatment for conditions other than malignant neoplasm: Secondary | ICD-10-CM

## 2015-05-09 DIAGNOSIS — R52 Pain, unspecified: Secondary | ICD-10-CM

## 2015-05-09 DIAGNOSIS — Z938 Other artificial opening status: Secondary | ICD-10-CM

## 2015-05-09 HISTORY — PX: THORACOTOMY: SHX5074

## 2015-05-09 HISTORY — DX: Malignant neoplasm of unspecified part of right bronchus or lung: C34.91

## 2015-05-09 LAB — CBC
HEMATOCRIT: 31.5 % — AB (ref 35.0–47.0)
HEMOGLOBIN: 10.7 g/dL — AB (ref 12.0–16.0)
MCH: 29.3 pg (ref 26.0–34.0)
MCHC: 34 g/dL (ref 32.0–36.0)
MCV: 86.1 fL (ref 80.0–100.0)
Platelets: 90 10*3/uL — ABNORMAL LOW (ref 150–440)
RBC: 3.65 MIL/uL — ABNORMAL LOW (ref 3.80–5.20)
RDW: 13 % (ref 11.5–14.5)
WBC: 9 10*3/uL (ref 3.6–11.0)

## 2015-05-09 LAB — GLUCOSE, CAPILLARY
GLUCOSE-CAPILLARY: 160 mg/dL — AB (ref 65–99)
Glucose-Capillary: 88 mg/dL (ref 65–99)

## 2015-05-09 LAB — BASIC METABOLIC PANEL
ANION GAP: 6 (ref 5–15)
BUN: 14 mg/dL (ref 6–20)
CHLORIDE: 108 mmol/L (ref 101–111)
CO2: 25 mmol/L (ref 22–32)
Calcium: 8.2 mg/dL — ABNORMAL LOW (ref 8.9–10.3)
Creatinine, Ser: 0.84 mg/dL (ref 0.44–1.00)
GFR calc non Af Amer: >60 mL/min (ref >60)
GLUCOSE: 194 mg/dL — AB (ref 65–99)
Potassium: 3.9 mmol/L (ref 3.5–5.1)
Sodium: 139 mmol/L (ref 135–145)

## 2015-05-09 LAB — MRSA PCR SCREENING: MRSA by PCR: NEGATIVE

## 2015-05-09 SURGERY — THORACOTOMY, MAJOR
Anesthesia: General | Laterality: Right | Wound class: Clean Contaminated

## 2015-05-09 MED ORDER — METFORMIN HCL 500 MG PO TABS
500.0000 mg | ORAL_TABLET | Freq: Two times a day (BID) | ORAL | Status: DC
  Administered 2015-05-10 – 2015-05-14 (×10): 500 mg via ORAL
  Filled 2015-05-09 (×12): qty 1

## 2015-05-09 MED ORDER — MORPHINE SULFATE (PF) 2 MG/ML IV SOLN
1.0000 mg | INTRAVENOUS | Status: DC | PRN
  Administered 2015-05-09 – 2015-05-10 (×5): 2 mg via INTRAVENOUS
  Filled 2015-05-09 (×5): qty 1

## 2015-05-09 MED ORDER — ONDANSETRON HCL 4 MG/2ML IJ SOLN
INTRAMUSCULAR | Status: DC | PRN
  Administered 2015-05-09: 4 mg via INTRAVENOUS

## 2015-05-09 MED ORDER — ONDANSETRON HCL 4 MG/2ML IJ SOLN
4.0000 mg | Freq: Four times a day (QID) | INTRAMUSCULAR | Status: DC | PRN
  Administered 2015-05-12 – 2015-05-14 (×3): 4 mg via INTRAVENOUS
  Filled 2015-05-09 (×4): qty 2

## 2015-05-09 MED ORDER — FENTANYL CITRATE (PF) 100 MCG/2ML IJ SOLN
INTRAMUSCULAR | Status: AC
  Filled 2015-05-09: qty 2

## 2015-05-09 MED ORDER — DEXTROSE 5 % IV SOLN
1.5000 g | Freq: Two times a day (BID) | INTRAVENOUS | Status: AC
  Administered 2015-05-09 – 2015-05-10 (×2): 1.5 g via INTRAVENOUS
  Filled 2015-05-09 (×2): qty 1.5

## 2015-05-09 MED ORDER — VITAMIN D 1000 UNITS PO TABS
1000.0000 [IU] | ORAL_TABLET | Freq: Every day | ORAL | Status: DC
  Administered 2015-05-10 – 2015-05-18 (×9): 1000 [IU] via ORAL
  Filled 2015-05-09 (×9): qty 1

## 2015-05-09 MED ORDER — BISACODYL 5 MG PO TBEC
10.0000 mg | DELAYED_RELEASE_TABLET | Freq: Every day | ORAL | Status: DC
  Administered 2015-05-10 – 2015-05-12 (×3): 10 mg via ORAL
  Filled 2015-05-09 (×4): qty 2

## 2015-05-09 MED ORDER — PHENYLEPHRINE HCL 10 MG/ML IJ SOLN: INTRAMUSCULAR | Status: DC | PRN

## 2015-05-09 MED ORDER — PHENYLEPHRINE HCL 10 MG/ML IJ SOLN
INTRAMUSCULAR | Status: DC | PRN
  Administered 2015-05-09 (×2): 50 ug via INTRAVENOUS
  Administered 2015-05-09: 100 ug via INTRAVENOUS
  Administered 2015-05-09: 50 ug via INTRAVENOUS
  Administered 2015-05-09 (×2): 100 ug via INTRAVENOUS

## 2015-05-09 MED ORDER — FAMOTIDINE 20 MG PO TABS: 20.0000 mg | ORAL_TABLET | Freq: Once | ORAL | Status: DC

## 2015-05-09 MED ORDER — HYDROMORPHONE HCL 1 MG/ML IJ SOLN
INTRAMUSCULAR | Status: AC
  Filled 2015-05-09: qty 1

## 2015-05-09 MED ORDER — ONDANSETRON HCL 4 MG/2ML IJ SOLN: 4.0000 mg | Freq: Once | INTRAMUSCULAR | Status: DC | PRN

## 2015-05-09 MED ORDER — HYDROMORPHONE HCL 1 MG/ML IJ SOLN
0.2500 mg | INTRAMUSCULAR | Status: DC | PRN
  Administered 2015-05-09 (×6): 0.25 mg via INTRAVENOUS

## 2015-05-09 MED ORDER — SODIUM CHLORIDE 0.9 % IJ SOLN
INTRAMUSCULAR | Status: AC
  Filled 2015-05-09: qty 50

## 2015-05-09 MED ORDER — DEXTROSE-NACL 5-0.45 % IV SOLN
INTRAVENOUS | Status: DC
  Administered 2015-05-09: 19:00:00 via INTRAVENOUS

## 2015-05-09 MED ORDER — SODIUM CHLORIDE 0.9 % IV SOLN
INTRAVENOUS | Status: DC | PRN
  Administered 2015-05-09: 50 mL

## 2015-05-09 MED ORDER — PROPOFOL 10 MG/ML IV BOLUS
INTRAVENOUS | Status: DC | PRN
  Administered 2015-05-09: 150 mg via INTRAVENOUS
  Administered 2015-05-09: 50 mg via INTRAVENOUS

## 2015-05-09 MED ORDER — FENTANYL CITRATE (PF) 100 MCG/2ML IJ SOLN
25.0000 ug | INTRAMUSCULAR | Status: DC | PRN
  Administered 2015-05-09 (×4): 25 ug via INTRAVENOUS

## 2015-05-09 MED ORDER — MINERAL OIL LIGHT 100 % EX OIL
TOPICAL_OIL | CUTANEOUS | Status: AC
  Filled 2015-05-09: qty 25

## 2015-05-09 MED ORDER — ACETAMINOPHEN 10 MG/ML IV SOLN
INTRAVENOUS | Status: DC | PRN
  Administered 2015-05-09: 1000 mg via INTRAVENOUS

## 2015-05-09 MED ORDER — BUPIVACAINE LIPOSOME 1.3 % IJ SUSP
INTRAMUSCULAR | Status: AC
  Filled 2015-05-09: qty 20

## 2015-05-09 MED ORDER — SODIUM CHLORIDE 0.9 % IV SOLN
INTRAVENOUS | Status: DC
  Administered 2015-05-09: 10:00:00 via INTRAVENOUS

## 2015-05-09 MED ORDER — ROCURONIUM BROMIDE 100 MG/10ML IV SOLN
INTRAVENOUS | Status: DC | PRN
  Administered 2015-05-09 (×3): 10 mg via INTRAVENOUS
  Administered 2015-05-09: 5 mg via INTRAVENOUS
  Administered 2015-05-09: 30 mg via INTRAVENOUS

## 2015-05-09 MED ORDER — SUCCINYLCHOLINE CHLORIDE 20 MG/ML IJ SOLN
INTRAMUSCULAR | Status: DC | PRN
  Administered 2015-05-09: 140 mg via INTRAVENOUS
  Administered 2015-05-09: 60 mg via INTRAVENOUS

## 2015-05-09 MED ORDER — FENTANYL CITRATE (PF) 100 MCG/2ML IJ SOLN
INTRAMUSCULAR | Status: DC | PRN
  Administered 2015-05-09 (×4): 50 ug via INTRAVENOUS
  Administered 2015-05-09: 100 ug via INTRAVENOUS
  Administered 2015-05-09 (×3): 50 ug via INTRAVENOUS

## 2015-05-09 MED ORDER — ALBUTEROL SULFATE (2.5 MG/3ML) 0.083% IN NEBU
2.5000 mg | INHALATION_SOLUTION | RESPIRATORY_TRACT | Status: DC
  Administered 2015-05-09 – 2015-05-10 (×3): 2.5 mg via RESPIRATORY_TRACT
  Filled 2015-05-09 (×5): qty 3

## 2015-05-09 MED ORDER — SUGAMMADEX SODIUM 200 MG/2ML IV SOLN
INTRAVENOUS | Status: DC | PRN
  Administered 2015-05-09: 120 mg via INTRAVENOUS

## 2015-05-09 MED ORDER — SODIUM CHLORIDE 0.9 % IJ SOLN
INTRAMUSCULAR | Status: AC
  Filled 2015-05-09: qty 3

## 2015-05-09 MED ORDER — DEXTROSE 5 % IV SOLN
1.5000 g | INTRAVENOUS | Status: AC
  Administered 2015-05-09: 1.5 g via INTRAVENOUS
  Filled 2015-05-09: qty 1.5

## 2015-05-09 MED ORDER — LIDOCAINE HCL (CARDIAC) 20 MG/ML IV SOLN
INTRAVENOUS | Status: DC | PRN
  Administered 2015-05-09: 60 mg via INTRAVENOUS

## 2015-05-09 MED ORDER — MIDAZOLAM HCL 2 MG/2ML IJ SOLN
INTRAMUSCULAR | Status: DC | PRN
  Administered 2015-05-09: 2 mg via INTRAVENOUS

## 2015-05-09 MED ORDER — LOSARTAN POTASSIUM 50 MG PO TABS
100.0000 mg | ORAL_TABLET | Freq: Every day | ORAL | Status: DC
  Administered 2015-05-10 – 2015-05-18 (×7): 100 mg via ORAL
  Filled 2015-05-09 (×9): qty 2

## 2015-05-09 SURGICAL SUPPLY — 75 items
APPLIER CLIP 13 LRG OPEN (CLIP) ×3
BENZOIN TINCTURE PRP APPL 2/3 (GAUZE/BANDAGES/DRESSINGS) ×3 IMPLANT
BNDG COHESIVE 4X5 TAN STRL (GAUZE/BANDAGES/DRESSINGS) ×3 IMPLANT
BRONCHOSCOPE PED SLIM DISP (MISCELLANEOUS) ×3 IMPLANT
BULB RESERV EVAC DRAIN JP 100C (MISCELLANEOUS) ×3 IMPLANT
CANISTER SUCT 1200ML W/VALVE (MISCELLANEOUS) ×9 IMPLANT
CANISTER SUCT 3000ML (MISCELLANEOUS) ×6 IMPLANT
CATH FOL LEG HOLDER (MISCELLANEOUS) ×3 IMPLANT
CATH THOR RT ANG 28F 9128 SOFT (CATHETERS) ×3 IMPLANT
CATH THOR STR 28F  SOFT WA (CATHETERS) ×1
CATH THOR STR 28F SOFT WA (CATHETERS) ×2 IMPLANT
CATH TRAY 16F METER LATEX (MISCELLANEOUS) ×3 IMPLANT
CATH URET ROBINSON 16FR STRL (CATHETERS) ×3 IMPLANT
CHLORAPREP W/TINT 26ML (MISCELLANEOUS) ×6 IMPLANT
CLIP APPLIE 13 LRG OPEN (CLIP) ×2 IMPLANT
CNTNR SPEC 2.5X3XGRAD LEK (MISCELLANEOUS)
CONT SPEC 4OZ STER OR WHT (MISCELLANEOUS)
CONTAINER SPEC 2.5X3XGRAD LEK (MISCELLANEOUS) IMPLANT
CUTTER ECHEON FLEX ENDO 45 340 (ENDOMECHANICALS) IMPLANT
DEFOGGER SCOPE WARMER CLEARIFY (MISCELLANEOUS) ×3 IMPLANT
DRAIN CHANNEL JP 19F (MISCELLANEOUS) ×3 IMPLANT
DRAIN CHEST DRY SUCT SGL (MISCELLANEOUS) ×3 IMPLANT
DRAIN CONNECTOR BLAKE 3:1 (MISCELLANEOUS) ×3 IMPLANT
DRAPE C-SECTION (MISCELLANEOUS) ×3 IMPLANT
DRAPE MAG INST 16X20 L/F (DRAPES) ×3 IMPLANT
DRSG OPSITE POSTOP 4X6 (GAUZE/BANDAGES/DRESSINGS) IMPLANT
DRSG OPSITE POSTOP 4X8 (GAUZE/BANDAGES/DRESSINGS) ×3 IMPLANT
DRSG TELFA 3X8 NADH (GAUZE/BANDAGES/DRESSINGS) ×3 IMPLANT
ELECT BLADE 6.5 EXT (BLADE) ×3 IMPLANT
ELECT CAUTERY BLADE TIP 2.5 (TIP) ×3
ELECT REM PT RETURN 9FT ADLT (ELECTROSURGICAL) ×3
ELECTRODE CAUTERY BLDE TIP 2.5 (TIP) ×2 IMPLANT
ELECTRODE REM PT RTRN 9FT ADLT (ELECTROSURGICAL) ×2 IMPLANT
GAUZE SPONGE 4X4 12PLY STRL (GAUZE/BANDAGES/DRESSINGS) ×3 IMPLANT
GLOVE EXAM LX STRL 7.5 (GLOVE) ×18 IMPLANT
GOWN STRL REUS W/ TWL LRG LVL3 (GOWN DISPOSABLE) ×10 IMPLANT
GOWN STRL REUS W/TWL LRG LVL3 (GOWN DISPOSABLE) ×5
KIT RM TURNOVER STRD PROC AR (KITS) ×3 IMPLANT
LABEL OR SOLS (LABEL) ×3 IMPLANT
LOOP RED MAXI  1X406MM (MISCELLANEOUS) ×1
LOOP VESSEL MAXI 1X406 RED (MISCELLANEOUS) ×2 IMPLANT
MARKER SKIN DUAL TIP RULER LAB (MISCELLANEOUS) ×3 IMPLANT
PACK BASIN MAJOR ARMC (MISCELLANEOUS) ×3 IMPLANT
REDUCER CONN 3/8X1/4X1/4IN (MISCELLANEOUS) ×3 IMPLANT
RELOAD GOLD ECHELON 45 (STAPLE) ×6 IMPLANT
RELOAD STAPLER LINE PROX 30 GR (STAPLE) ×4 IMPLANT
SEALANT PROGEL (MISCELLANEOUS) ×3 IMPLANT
SPONGE KITTNER 5P (MISCELLANEOUS) ×6 IMPLANT
STAPLE RELOAD 2.5MM WHITE (STAPLE) ×9 IMPLANT
STAPLER RELOAD LINE PROX 30 GR (STAPLE) ×6
STAPLER SKIN PROX 35W (STAPLE) ×3 IMPLANT
STAPLER VASCULAR ECHELON 35 (CUTTER) ×3 IMPLANT
STRIP CLOSURE SKIN 1/2X4 (GAUZE/BANDAGES/DRESSINGS) ×3 IMPLANT
SUT ETHILON 4-0 (SUTURE) ×1
SUT ETHILON 4-0 FS2 18XMFL BLK (SUTURE) ×2
SUT MNCRL 3-0 (SUTURE) ×6 IMPLANT
SUT PROLENE 4 0 SH DA (SUTURE) ×6 IMPLANT
SUT SILK 0 (SUTURE) ×1
SUT SILK 0 30XBRD TIE 6 (SUTURE) ×2 IMPLANT
SUT SILK 1 SH (SUTURE) ×21 IMPLANT
SUT VIC AB 0 CT1 36 (SUTURE) ×3 IMPLANT
SUT VIC AB 2-0 CT1 (SUTURE) ×3 IMPLANT
SUT VIC AB 2-0 CT1 27 (SUTURE) ×2
SUT VIC AB 2-0 CT1 TAPERPNT 27 (SUTURE) ×4 IMPLANT
SUT VICRYL 2 TP 1 (SUTURE) ×12 IMPLANT
SUTURE ETHLN 4-0 FS2 18XMF BLK (SUTURE) ×2 IMPLANT
SYR 10ML SLIP (SYRINGE) ×3 IMPLANT
SYR BULB IRRIG 60ML STRL (SYRINGE) ×3 IMPLANT
TAPE ADH 3 LX (MISCELLANEOUS) ×3 IMPLANT
TAPE TRANSPORE STRL 2 31045 (GAUZE/BANDAGES/DRESSINGS) ×3 IMPLANT
TROCAR FLEXIPATH 20X80 (ENDOMECHANICALS) IMPLANT
TROCAR FLEXIPATH THORACIC 15MM (ENDOMECHANICALS) IMPLANT
TUBING CONNECTING 10 (TUBING) ×3 IMPLANT
WATER STERILE IRR 1000ML POUR (IV SOLUTION) ×18 IMPLANT
YANKAUER SUCT BULB TIP FLEX NO (MISCELLANEOUS) ×3 IMPLANT

## 2015-05-09 NOTE — Anesthesia Preprocedure Evaluation (Addendum)
Anesthesia Evaluation  Patient identified by MRN, date of birth, ID band Patient awake    Reviewed: Allergy & Precautions, H&P , NPO status , Patient's Chart, lab work & pertinent test results, reviewed documented beta blocker date and time   Airway Mallampati: II  TM Distance: >3 FB Neck ROM: full    Dental  (+) Teeth Intact, Missing, Dental Advisory Given, Poor Dentition   Pulmonary neg pulmonary ROS,    Pulmonary exam normal        Cardiovascular Exercise Tolerance: Good hypertension, + CAD  negative cardio ROS Normal cardiovascular exam+ Valvular Problems/Murmurs AI and AS  Rhythm:regular Rate:Normal  Recent EST without ischemia   EF > 50%    Neuro/Psych  Neuromuscular disease negative neurological ROS  negative psych ROS   GI/Hepatic negative GI ROS, Neg liver ROS, GERD  ,  Endo/Other  negative endocrine ROSdiabetes, Well Controlled, Type 2  Renal/GU negative Renal ROS  negative genitourinary   Musculoskeletal   Abdominal   Peds  Hematology negative hematology ROS (+) anemia ,   Anesthesia Other Findings Past Medical History:   Diabetes mellitus without complication ([place])                 Hypertension                                                 Cataract                                                     Basal cell carcinoma of skin                    2016         Anemia                                                       Stomach tumor (benign)                                       Murmur                                                       Arthritis                                                      Comment:back   Osteoporosis  Past Surgical History:   APPENDECTOMY                                                  SHOULDER ARTHROSCOPY W/ CAPSULAR REPAIR         Right              ABDOMINAL HYSTERECTOMY                                         Reproductive/Obstetrics negative OB ROS                           Anesthesia Physical Anesthesia Plan  ASA: III  Anesthesia Plan: General ETT   Post-op Pain Management:    Induction: Intravenous  Airway Management Planned: Double Lumen EBT  Additional Equipment: Arterial line  Intra-op Plan:   Post-operative Plan:   Informed Consent: I have reviewed the patients History and Physical, chart, labs and discussed the procedure including the risks, benefits and alternatives for the proposed anesthesia with the patient or authorized representative who has indicated his/her understanding and acceptance.   Dental Advisory Given and Dental advisory given  Plan Discussed with: CRNA  Anesthesia Plan Comments:         Anesthesia Quick Evaluation

## 2015-05-09 NOTE — OR Nursing (Signed)
On patient's arrival to pacu, she had been given 50 mcg of fentanyl prior to arrival.  Patient obstructed and sats dropped rapidly to 50, jaw thrust corrected the problem.  Afterward the patient's respiratory status remained stable.

## 2015-05-09 NOTE — Anesthesia Procedure Notes (Signed)
Procedure Name: Intubation Date/Time: 05/09/2015 10:56 AM Performed by: Johnna Acosta Pre-anesthesia Checklist: Patient identified, Emergency Drugs available, Suction available, Patient being monitored and Timeout performed Patient Re-evaluated:Patient Re-evaluated prior to inductionOxygen Delivery Method: Circle system utilized Preoxygenation: Pre-oxygenation with 100% oxygen Intubation Type: IV induction Ventilation: Mask ventilation without difficulty Laryngoscope Size: Mac, 4, Miller and 2 Grade View: Grade II Tube type: Oral Endobronchial tube: Left, Double lumen EBT, EBT position confirmed by auscultation and EBT position confirmed by fiberoptic bronchoscope and 35 Fr Number of attempts: 3 Airway Equipment and Method: Fiberoptic brochoscope and Stylet Placement Confirmation: ETT inserted through vocal cords under direct vision,  positive ETCO2 and breath sounds checked- equal and bilateral Secured at: 28 cm Tube secured with: Tape Dental Injury: Teeth and Oropharynx as per pre-operative assessment

## 2015-05-09 NOTE — Brief Op Note (Signed)
  05/09/2015  5:11 PM  PATIENT:  Stacy Cook  80 y.o. female  PRE-OPERATIVE DIAGNOSIS:  Right Lower Lobe Mass `   POST-OPERATIVE DIAGNOSIS:  Same   PROCEDURE:  Bronchoscopy with right thoracotomy and right lower lobectomy  SURGEON:  Surgeon(s) and Role:    * Nestor Lewandowsky, MD - Primary    * Hubbard Robinson, MD - Assisting  ASSISTANTS: Jarred Fisher PAS  ANESTHESIA: General  INDICATIONS FOR PROCEDURE Right Lower Lobe Mass  DICTATION: Dragon   Nestor Lewandowsky, MD

## 2015-05-09 NOTE — Pre-Procedure Instructions (Signed)
Received from Dr Jerrye Beavers note on EKG performed at PAT visit "No Change" from stress test performed on 04/15/15 faxed copy placed in patients chart

## 2015-05-09 NOTE — Op Note (Signed)
05/09/2015  5:52 PM  PATIENT:  Stacy Cook  80 y.o. female  PRE-OPERATIVE DIAGNOSIS:  Right lower lobe mass  POST-OPERATIVE DIAGNOSIS:  Right lower lobe mass  PROCEDURE:  Preoperative bronchoscopy to assess endobronchial anatomy; muscle-sparing right thoracotomy with right lower lobectomy and mediastinal lymphadenectomy  SURGEON:  Surgeon(s) and Role:    * Nestor Lewandowsky, MD - Primary    * Hubbard Robinson, MD - Assisting  ASSISTANTS: Beverlyn Roux physician's assistant student  ANESTHESIA: Gen.  INDICATIONS FOR PROCEDURE right lower lobe mass  DICTATION: This patient is a 80 year old white female with a long-standing smoking history who was recently found to have a suspicious right lower lobe mass present on chest x-ray. CT scanning and PET scanning confirmed the presence of a 2.5 cm right lower lobe mass. After extensive evaluation she was found be a suitable candidate for surgery. Indications and risks were explained patient gave her informed consent.  The patient is brought to the operating suite and placed in supine position. General endotracheal anesthesia was given through a double-lumen tube. Preoperative bronchoscopy was carried out. This was normal to the subsegmental levels bilaterally. Patient was then turned for right thoracotomy. All pressure points were carefully padded. The patient's prepped and draped in usual sterile fashion.  A incision was made extending from the tip of the scapula anteriorly. Subcutaneous flaps were created and the latissimus and serratus muscles were separated. They were retracted medially and laterally and the chest was entered through the fifth interspace. Upon entering the chest we freed up the inferior pulmonary ligament. A good look at the lungs and revealed a mass present within the superior segment of the right lower lobe. This appeared to be an obvious malignancy. There is no intrapleural disease. We elected to perform a right lower  lobectomy as it was not possible to perform a wedge resection on this. The pulmonary artery is identified at the depths of the fissure. The fissure between the upper lobes and lower lobes were completed with a stapler. The pulmonary artery branches were 2 in number. The superior segmental branch and the basilar branches. These were taken with a vascular stapler. The inferior pulmonary vein was identified and encircled and was also taken with the vascular stapler. The fissure between the upper lobe and the lower lobe was incomplete and after dissection we completed the fissure with the endoscopic stapler being certain to get a good margin on the superior segment right lower lobe mass. Once this was complete the only 2 remaining structures were the upper thigh. We elected to take the superior segmental bronchus and the basilar bronchi separately. A TX stapler was used to secure these 2 bronchi. The middle lobe bronchus was easily seen and kept out of harm's way. The middle lobe and the upper lobe were joined for a considerable distance with a lung parenchyma and there are not stapled together. The middle lobe and upper lobes ventilated nicely. We then copiously irrigated the chest. We used pro-gel and the cut surfaces of the lung. There was some bleeding points from the bronchial margin. These were oversewn with a 4-0 Prolene. We then leak checked the bronchus at 30 cm water pressure. There is no air leak from the bronchus.    2 chest tubes were inserted. An angled and a straightener standard positions. They were secured to the skin with silk. The upper and lower lobes again ventilated quite nicely. We took some lymph nodes from multiple stations present. We opened up the  pretracheal space and there are no nodes identified. There is nothing suspicious in any of the lymph node stations. Once ensuring complete hemostasis the chest was then closed. We used some liposomal bupivacaine is paravertebral blocks. The chest  was then closed with #2 Vicryl pericostal sutures along the thoracotomy wound. The muscles of the chest wall were allowed returned to their normal anatomic position. They were secured together with a running 2-0 Vicryl. A 19 Blake drain was positioned in the subcutaneous tissues. Scarpa's fascia was closed over the drain. The skin was closed with staples. Her access incision for our camera and stapler was secured with nylon on the skin and Vicryl and the deeper tissues.  The patient was then rolled in the supine position where she was extubated and taken to the recovery room in stable condition.   Nestor Lewandowsky, MD

## 2015-05-09 NOTE — OR Nursing (Signed)
Patient continues to c/o pain in her right shoulder, history of fracture/dislocation in 2014

## 2015-05-09 NOTE — Anesthesia Postprocedure Evaluation (Signed)
Anesthesia Post Note  Patient: Stacy Cook  Procedure(s) Performed: Procedure(s) (LRB): THORACOTOMY, RIGHT LOWER LOBECTOMY, BRONCHOSCOPY (Right)  Patient location during evaluation: PACU Anesthesia Type: General Level of consciousness: awake and alert Pain management: pain level controlled Vital Signs Assessment: post-procedure vital signs reviewed and stable Respiratory status: spontaneous breathing, nonlabored ventilation, respiratory function stable and patient connected to nasal cannula oxygen Cardiovascular status: blood pressure returned to baseline and stable Postop Assessment: no signs of nausea or vomiting Anesthetic complications: no    Last Vitals:  Filed Vitals:   05/09/15 1721 05/09/15 1736  BP: 156/62 147/74  Pulse: 81 74  Temp:    Resp:      Last Pain:  Filed Vitals:   05/09/15 1738  PainSc: 4                  [name]

## 2015-05-09 NOTE — Progress Notes (Signed)
I have reviewed the history and physical and there are no changes.  The patient understands the proposed procedure and all questions answered.  Nestor Lewandowsky, MD

## 2015-05-09 NOTE — Transfer of Care (Signed)
Immediate Anesthesia Transfer of Care Note  Patient: Stacy Cook  Procedure(s) Performed: Procedure(s): THORACOTOMY, RIGHT LOWER LOBECTOMY, BRONCHOSCOPY (Right)  Patient Location: PACU  Anesthesia Type:General  Level of Consciousness: awake  Airway & Oxygen Therapy: Patient Spontanous Breathing and Patient connected to face mask oxygen  Post-op Assessment: Report given to RN and Post -op Vital signs reviewed and stable  Post vital signs: Reviewed and stable  Last Vitals:  Filed Vitals:   05/09/15 1007 05/09/15 1521  BP: 199/66 138/72  Pulse: 78 66  Temp: 36.7 C 36.4 C  Resp: 16 14    Complications: No apparent anesthesia complications

## 2015-05-10 ENCOUNTER — Encounter: Payer: Self-pay | Admitting: Cardiothoracic Surgery

## 2015-05-10 ENCOUNTER — Inpatient Hospital Stay: Payer: Medicare Other

## 2015-05-10 LAB — TYPE AND SCREEN
ABO/RH(D): O POS
ANTIBODY SCREEN: NEGATIVE
UNIT DIVISION: 0
Unit division: 0

## 2015-05-10 LAB — GLUCOSE, CAPILLARY
Glucose-Capillary: 119 mg/dL — ABNORMAL HIGH (ref 65–99)
Glucose-Capillary: 147 mg/dL — ABNORMAL HIGH (ref 65–99)

## 2015-05-10 MED ORDER — ALBUTEROL SULFATE (2.5 MG/3ML) 0.083% IN NEBU
2.5000 mg | INHALATION_SOLUTION | RESPIRATORY_TRACT | Status: DC
  Administered 2015-05-11 (×3): 2.5 mg via RESPIRATORY_TRACT
  Filled 2015-05-10 (×5): qty 3

## 2015-05-10 MED ORDER — NALOXONE HCL 0.4 MG/ML IJ SOLN: 0.4000 mg | INTRAMUSCULAR | Status: DC | PRN

## 2015-05-10 MED ORDER — DIPHENHYDRAMINE HCL 50 MG/ML IJ SOLN: 12.5000 mg | Freq: Four times a day (QID) | INTRAMUSCULAR | Status: DC | PRN

## 2015-05-10 MED ORDER — HYDROCODONE-ACETAMINOPHEN 5-325 MG PO TABS: 1.0000 | ORAL_TABLET | ORAL | Status: DC | PRN

## 2015-05-10 MED ORDER — OXYCODONE HCL ER 15 MG PO T12A
15.0000 mg | EXTENDED_RELEASE_TABLET | Freq: Two times a day (BID) | ORAL | Status: DC
  Administered 2015-05-10: 15 mg via ORAL
  Filled 2015-05-10: qty 1

## 2015-05-10 MED ORDER — MORPHINE SULFATE 2 MG/ML IV SOLN
INTRAVENOUS | Status: DC
  Administered 2015-05-10 – 2015-05-11 (×2): 2 mg via INTRAVENOUS
  Administered 2015-05-11: 3 mg via INTRAVENOUS
  Administered 2015-05-11: 1 mg via INTRAVENOUS
  Filled 2015-05-10: qty 25

## 2015-05-10 MED ORDER — SODIUM CHLORIDE 0.9% FLUSH: 9.0000 mL | INTRAVENOUS | Status: DC | PRN

## 2015-05-10 MED ORDER — DIPHENHYDRAMINE HCL 12.5 MG/5ML PO ELIX
12.5000 mg | ORAL_SOLUTION | Freq: Four times a day (QID) | ORAL | Status: DC | PRN
  Filled 2015-05-10: qty 5

## 2015-05-10 MED ORDER — INSULIN ASPART 100 UNIT/ML ~~LOC~~ SOLN
0.0000 [IU] | Freq: Three times a day (TID) | SUBCUTANEOUS | Status: DC
  Administered 2015-05-10 – 2015-05-11 (×2): 1 [IU] via SUBCUTANEOUS
  Filled 2015-05-10 (×3): qty 1

## 2015-05-10 MED ORDER — ONDANSETRON HCL 4 MG/2ML IJ SOLN
4.0000 mg | Freq: Four times a day (QID) | INTRAMUSCULAR | Status: DC | PRN
  Administered 2015-05-11: 4 mg via INTRAVENOUS

## 2015-05-10 NOTE — Consult Note (Signed)
[facility] NAME: Stacy Cook    MR#:  [id number]  DATE OF BIRTH:  Apr 10, 1935  DATE OF ADMISSION:  05/09/2015  PRIMARY CARE PHYSICIAN: Adin Hector, MD   REQUESTING/REFERRING PHYSICIAN: oaks  CHIEF COMPLAINT: Medical management.  HISTORY OF PRESENT ILLNESS: Stacy Cook  is a 80 y.o. female with a known history of diabetes, hypertension, recently found right lung lower lobe mass and was taken for lobectomy by thoracic surgery which was done yesterday. Medical consult was called in today for medical management.  Patient has chest tube and Pleurx catheter on the right side chest status post surgery. She is complaining of pain in right side chest and shoulder. Denies any other complaints and being started on oral diet today by surgical team.  PAST MEDICAL HISTORY:   Past Medical History  Diagnosis Date  . Diabetes mellitus without complication ([place])   . Hypertension   . Cataract   . Basal cell carcinoma of skin 2016  . Anemia   . Stomach tumor (benign)   . Murmur   . Arthritis     back  . Osteoporosis     PAST SURGICAL HISTORY:  Past Surgical History  Procedure Laterality Date  . Appendectomy    . Shoulder arthroscopy w/ capsular repair Right   . Abdominal hysterectomy    . Thoracotomy Right 05/09/2015    Procedure: THORACOTOMY, RIGHT LOWER LOBECTOMY, BRONCHOSCOPY;  Surgeon: Nestor Lewandowsky, MD;  Location: [facility] ORS;  Service: Thoracic;  Laterality: Right;    SOCIAL HISTORY:  Social History  Substance Use Topics  . Smoking status: Never Smoker   . Smokeless tobacco: Not on file  . Alcohol Use: No    FAMILY HISTORY:  Family History  Problem Relation Age of Onset  . Diabetes Other     DRUG ALLERGIES:  Allergies  Allergen Reactions  . Statins     Joint pain  . Sulfa Antibiotics Nausea And Vomiting  . Pneumococcal Vaccine Itching    Hives and fever    REVIEW OF SYSTEMS:   CONSTITUTIONAL: No fever,  fatigue or weakness.  EYES: No blurred or double vision.  EARS, NOSE, AND THROAT: No tinnitus or ear pain.  RESPIRATORY: No cough, shortness of breath, wheezing or hemoptysis.  CARDIOVASCULAR: Positive for right side chest pain, orthopnea, edema.  GASTROINTESTINAL: No nausea, vomiting, diarrhea or abdominal pain.  GENITOURINARY: No dysuria, hematuria.  ENDOCRINE: No polyuria, nocturia,  HEMATOLOGY: No anemia, easy bruising or bleeding SKIN: No rash or lesion. MUSCULOSKELETAL: Right shoulder joint pain or arthritis.   NEUROLOGIC: No tingling, numbness, weakness.  PSYCHIATRY: No anxiety or depression.   MEDICATIONS AT HOME:  Prior to Admission medications   Medication Sig Start Date End Date Taking? Authorizing Provider  cholecalciferol (VITAMIN D) 1000 UNITS tablet Take 1,000 Units by mouth daily.   Yes Historical Provider, MD  cyanocobalamin (V-R VITAMIN B-12) 500 MCG tablet Take by mouth.   Yes Historical Provider, MD  HYDROcodone-acetaminophen (NORCO) 7.5-325 MG tablet 1 po qid prn Earliest Fill Date: 06/01/15 06/01/15  Yes Historical Provider, MD  losartan (COZAAR) 100 MG tablet Take 100 mg by mouth. 02/02/14  Yes Historical Provider, MD  Omega-3 Fatty Acids (FISH OIL) 1000 MG CAPS Take by mouth.   Yes Historical Provider, MD  aspirin 81 MG tablet Take 81 mg by mouth daily.    Historical Provider, MD  metFORMIN (GLUCOPHAGE) 500 MG tablet Take 500 mg by mouth. 05/05/14 05/05/15  Historical Provider, MD      PHYSICAL EXAMINATION:   VITAL SIGNS: Blood pressure 126/51, pulse 77, temperature 98.3 F (36.8 C), temperature source Oral, resp. rate 20, SpO2 100 %.  GENERAL:  80 y.o.-year-old patient lying in the bed with no acute distress.  EYES: Pupils equal, round, reactive to light and accommodation. No scleral icterus. Extraocular muscles intact.  HEENT: Head atraumatic, normocephalic. Oropharynx and nasopharynx clear.  NECK:  Supple, no jugular venous distention. No thyroid enlargement,  no tenderness.  LUNGS: Normal breath sounds bilaterally, no wheezing, rales,rhonchi or crepitation. No use of accessory muscles of respiration. Right side chest tube and catheter present. CARDIOVASCULAR: S1, S2 normal. No murmurs, rubs, or gallops.  ABDOMEN: Soft, nontender, nondistended. Bowel sounds present. No organomegaly or mass.  EXTREMITIES: No pedal edema, cyanosis, or clubbing.  NEUROLOGIC: Cranial nerves II through XII are intact. Muscle strength 5/5 in all extremities. Sensation intact. Gait not checked.  PSYCHIATRIC: The patient is alert and oriented x 3.  SKIN: No obvious rash, lesion, or ulcer.   LABORATORY PANEL:   CBC  Recent Labs Lab 05/09/15 1921  WBC 9.0  HGB 10.7*  HCT 31.5*  PLT 90*  MCV 86.1  MCH 29.3  MCHC 34.0  RDW 13.0   ------------------------------------------------------------------------------------------------------------------  Chemistries   Recent Labs Lab 05/09/15 1921  NA 139  K 3.9  CL 108  CO2 25  GLUCOSE 194*  BUN 14  CREATININE 0.84  CALCIUM 8.2*   ------------------------------------------------------------------------------------------------------------------ estimated creatinine clearance is 46.3 mL/min (by C-G formula based on Cr of 0.84). ------------------------------------------------------------------------------------------------------------------ No results for input(s): TSH, T4TOTAL, T3FREE, THYROIDAB in the last 72 hours.  Invalid input(s): FREET3   Coagulation profile No results for input(s): INR, PROTIME in the last 168 hours. ------------------------------------------------------------------------------------------------------------------- No results for input(s): DDIMER in the last 72 hours. -------------------------------------------------------------------------------------------------------------------  Cardiac Enzymes No results for input(s): CKMB, TROPONINI, MYOGLOBIN in the last 168 hours.  Invalid  input(s): CK ------------------------------------------------------------------------------------------------------------------ Invalid input(s): POCBNP  ---------------------------------------------------------------------------------------------------------------  Urinalysis    Component Value Date/Time   COLORURINE Straw 10/15/2011 1143   APPEARANCEUR Clear 10/15/2011 1143   LABSPEC 1.004 10/15/2011 1143   PHURINE 8.0 10/15/2011 1143   GLUCOSEU Negative 10/15/2011 1143   HGBUR Negative 10/15/2011 1143   BILIRUBINUR Negative 10/15/2011 1143   KETONESUR Trace 10/15/2011 1143   PROTEINUR Negative 10/15/2011 1143   NITRITE Negative 10/15/2011 1143   LEUKOCYTESUR Trace 10/15/2011 1143     RADIOLOGY: Dg Chest 1 View  05/09/2015  CLINICAL DATA:  Hypoxia, status post chest tube placement. EXAM: CHEST 1 VIEW COMPARISON:  May 03, 2015. FINDINGS: Interval placement of 2 right-sided chest tubes with no definite pneumothorax seen. Mild subcutaneous emphysema is seen over the right lateral chest wall. No significant pleural effusion is noted. Left lung is clear. Cardiomediastinal silhouette appears normal. IMPRESSION: Interval placement of 2 right-sided chest tubes. No definite pneumothorax is noted. Mild subcutaneous emphysema seen over right lateral chest wall. Electronically Signed   By: Marijo Conception, M.D.   On: 05/09/2015 15:58   Dg Shoulder 1v Right  05/10/2015  CLINICAL DATA:  Right shoulder pain, history of previous surgery EXAM: RIGHT SHOULDER - 1 VIEW COMPARISON:  05/13/2008 FINDINGS: Degenerative changes of the acromioclavicular joint are seen. No acute fracture or dislocation is noted. Right chest tubes are noted. No pneumothorax is seen. IMPRESSION: No acute bony abnormality is noted. Two right chest tubes are noted without pneumothorax. Electronically Signed   By: [name] Catalina M.D.   On: 05/10/2015 12:00  Dg Chest Port 1 View  05/10/2015  CLINICAL DATA:  Right lung surgery.   Chest tubes EXAM: PORTABLE CHEST 1 VIEW COMPARISON:  05/09/2015 FINDINGS: Two right chest tubes and right soft tissue chest wall drain again noted in place, unchanged. No visible pneumothorax. Right base atelectasis. Left lung is clear. Heart is normal size. No visible effusions. IMPRESSION: Postoperative changes on the right with right base atelectasis. No visible pneumothorax. Electronically Signed   By: Rolm Baptise M.D.   On: 05/10/2015 07:31    EKG: Orders placed or performed during the hospital encounter of 05/03/15  . EKG 12-Lead  . EKG 12-Lead    IMPRESSION AND PLAN:  * Right lung mass status post lobectomy by surgical team.  Status post chest tube.  Management per primary team.  She may need follow-up with oncology after having report from the pathology.  * Right side chest and shoulder pain  This could be due to surgery.  She had some old right shoulder surgery in the past.  But today the pain might be radiated pain from her diaphragm.\  I will do a right shoulder x-ray to rule out any pathology.   she is already on IV pain medication, I will add oral as patient is now starting with her diet.  * Some sore throat with this small streak of blood with coughing.  This may be secondary to post intubation and bronchoscopy procedures yesterday during surgery.  Continue nebulizer treatment. Currently does not seem to require any antibiotics.  * Diabetes  Continue metformin and keep on insulin sliding scale coverage.  * Thrombocytopenia  Possibly due to cancer, monitor tomorrow.  AlI will do a chest x-rayl the records are reviewed and case discussed with ED provider. Management plans discussed with the patient, family and they are in agreement.  CODE STATUS:    Code Status Orders        Start     Ordered   05/09/15 1833  Full code   Continuous     05/09/15 1832    Code Status History    Date Active Date Inactive Code Status Order ID Comments User Context   This  patient has a current code status but no historical code status.       TOTAL TIME TAKING CARE OF THIS PATIENT: 50 minutes.    Vaughan Basta M.D on 05/10/2015   Between 7am to 6pm - Pager - [phone]  After 6pm go to [email or web address] - password [email or web address] [facility]  Office  [phone]  CC: Primary care physician; Adin Hector, MD   Note: This dictation was prepared with Dragon dictation along with smaller phrase technology. Any transcriptional errors that result from this process are unintentional.

## 2015-05-10 NOTE — Progress Notes (Signed)
Stacy Cook Inpatient Post-Op Note  Patient ID: Stacy Cook, female   DOB: 12-15-1935, 80 y.o.   MRN: [id number]  HISTORY: Biggest concern is pain in her right shoulder.  She has undergone prior surgery to that area and she states that her chest wound is OK but her shoulder hurts.  Not hungry this morning.   Filed Vitals:   05/10/15 0400 05/10/15 0500  BP: 115/50 124/54  Pulse: 68 67  Temp:    Resp: 13 13     EXAM: Resp: Lungs are clear on the left and diminished on the right with mechanical sounds from chest tube.  No respiratory distress, normal effort. Heart:  Regular with murmur. Skin: Skin is warm and dry. No rash noted. No diaphoretic. No erythema. No pallor.  Psychiatric: Normal mood and affect. Normal behavior. Judgment and thought content normal.   Some bloody drainage on dressings and all were changed this morning.  Wounds are clean dry and intact.  Small amount of old blood at JP site.  CT drainage is serous with more sanguinous JP drainage.  Recharged bulb.  Scant blood in JP.   ASSESSMENT AND PLAN Will add oral pain meds  Repeat CXRay today Wean oxygen Transfer to floor         Nestor Lewandowsky, MD

## 2015-05-10 NOTE — Evaluation (Signed)
Physical Therapy Evaluation Patient Details Name: Stacy Cook MRN: [id number] DOB: December 13, 1935 Today's Date: 05/10/2015   History of Present Illness  admitted for acute hospitalization status post thoracotomy with R lower lobectomy and mediastinal lymphadenectomy (05/09/15)  Clinical Impression  Upon evaluation, patient alert and oriented; follows all commands and demonstrates good insight into health condition.  Bilat UE/LE strength and ROM grossly symmetrical and WFL for basic transfers and mobility; R UE generally limited by shoulder pain and surgical/incision pain (6/10).  Patient able to complete supine LE therex without difficulty, demonstrating good isolated strength of LEs (4 to 4+/5).  OOB attempts and functional mobility not tested secondary to nursing request to remain bed-level for pain control; will continue to assess/progress as medically appropriate and patient able to tolerate. Would benefit from skilled PT to address above deficits and promote optimal return to PLOF; anticipate patient okay to discharge home with HHPT, but will continue to assess/update as able to complete additional activity in subsequent sessions.    Follow Up Recommendations Home health PT (anticipate home with HHPT, will continue to assess and update as patient tolerates more OOB activities)    Equipment Recommendations       Recommendations for Other Services       Precautions / Restrictions Precautions Precautions: Fall Precaution Comments: R chest tube, R chest JP drain, L radial arterial line Restrictions Weight Bearing Restrictions: No      Mobility  Bed Mobility                  Transfers                 General transfer comment: not tested secondary to nursing request to remain bed-level for pain control  Ambulation/Gait             General Gait Details: not tested secondary to nursing request to remain bed-level for pain control  Stairs             Wheelchair Mobility    Modified Rankin (Stroke Patients Only)       Balance                                             Pertinent Vitals/Pain Pain Assessment: 0-10 Pain Score: 6  Pain Location: R shoulder, chest wall Pain Descriptors / Indicators: Aching Pain Intervention(s): Limited activity within patient's tolerance;Monitored during session;Repositioned;Patient requesting pain meds-RN notified (RN preparing for initiation of PCA pump during session)    Home Living Family/patient expects to be discharged to:: Private residence Living Arrangements: Alone Available Help at Discharge: Family Type of Home: House Home Access: Stairs to enter Entrance Stairs-Rails: None Entrance Stairs-Number of Steps: 4 Home Layout: One level Home Equipment: None      Prior Function Level of Independence: Independent         Comments: Indep with all ADLs, household and community activities; denies fall history.  + driving, very active     Hand Dominance        Extremity/Trunk Assessment   Upper Extremity Assessment: Overall WFL for tasks assessed           Lower Extremity Assessment: Overall WFL for tasks assessed (grossly 4/5 throughout; denies sensory deficit)         Communication   Communication: No difficulties  Cognition Arousal/Alertness: Awake/alert Behavior During Therapy: WFL for tasks assessed/performed Overall  Cognitive Status: Within Functional Limits for tasks assessed                      General Comments      Exercises Other Exercises Other Exercises: Supine LE therex, 1x15, AROM for muscular strength/endurance with functional activities: ankle pumps, quad sets, SAQs, hip abduct/adduct, heel sides with resisted extension, SLR.  Good functional LE strength evident      Assessment/Plan    PT Assessment Patient needs continued PT services  PT Diagnosis Difficulty walking;Generalized weakness;Acute pain   PT Problem  List Decreased strength;Decreased range of motion;Decreased activity tolerance;Decreased balance;Decreased mobility;Decreased coordination;Decreased skin integrity;Cardiopulmonary status limiting activity;Pain  PT Treatment Interventions DME instruction;Gait training;Stair training;Functional mobility training;Patient/family education;Therapeutic activities;Therapeutic exercise;Balance training   PT Goals (Current goals can be found in the Care Plan section) Acute Rehab PT Goals Patient Stated Goal: "to get home" PT Goal Formulation: With patient/family Time For Goal Achievement: 05/24/15 Potential to Achieve Goals: Good Additional Goals Additional Goal #1: Assess and establish goals for OOB activities and gait as appropriate    Frequency Min 2X/week   Barriers to discharge Decreased caregiver support      Co-evaluation               End of Session   Activity Tolerance: Patient limited by pain Patient left: in bed;with call bell/phone within reach;with nursing/sitter in room;with family/visitor present           Time: 9794-8016 PT Time Calculation (min) (ACUTE ONLY): 22 min   Charges:   PT Evaluation $PT Eval Moderate Complexity: 1 Procedure PT Treatments $Therapeutic Exercise: 8-22 mins   PT G Codes:        Mayling Aber H. Owens Shark, PT, DPT, NCS 05/10/2015, 4:57 PM [phone]

## 2015-05-10 NOTE — Consult Note (Signed)
Pt seen in room.  Ordered some pain meds, and xray for Right shoulder.  Will continue to follow along with surgery.  Full consult note to follow.

## 2015-05-11 LAB — BASIC METABOLIC PANEL
ANION GAP: 4 — AB (ref 5–15)
BUN: 15 mg/dL (ref 6–20)
CO2: 27 mmol/L (ref 22–32)
Calcium: 7.7 mg/dL — ABNORMAL LOW (ref 8.9–10.3)
Chloride: 109 mmol/L (ref 101–111)
Creatinine, Ser: 0.83 mg/dL (ref 0.44–1.00)
Glucose, Bld: 117 mg/dL — ABNORMAL HIGH (ref 65–99)
POTASSIUM: 3.7 mmol/L (ref 3.5–5.1)
SODIUM: 140 mmol/L (ref 135–145)

## 2015-05-11 LAB — CBC
HCT: 23.7 % — ABNORMAL LOW (ref 35.0–47.0)
HEMOGLOBIN: 8 g/dL — AB (ref 12.0–16.0)
MCH: 29.3 pg (ref 26.0–34.0)
MCHC: 33.8 g/dL (ref 32.0–36.0)
MCV: 86.5 fL (ref 80.0–100.0)
PLATELETS: 85 10*3/uL — AB (ref 150–440)
RBC: 2.74 MIL/uL — AB (ref 3.80–5.20)
RDW: 13.3 % (ref 11.5–14.5)
WBC: 5.7 10*3/uL (ref 3.6–11.0)

## 2015-05-11 LAB — GLUCOSE, CAPILLARY
GLUCOSE-CAPILLARY: 117 mg/dL — AB (ref 65–99)
GLUCOSE-CAPILLARY: 124 mg/dL — AB (ref 65–99)
GLUCOSE-CAPILLARY: 130 mg/dL — AB (ref 65–99)
GLUCOSE-CAPILLARY: 132 mg/dL — AB (ref 65–99)
Glucose-Capillary: 102 mg/dL — ABNORMAL HIGH (ref 65–99)

## 2015-05-11 MED ORDER — HYDROCODONE-ACETAMINOPHEN 5-325 MG PO TABS
1.0000 | ORAL_TABLET | ORAL | Status: DC | PRN
Start: 2015-05-11 — End: 2015-05-12
  Administered 2015-05-11 (×2): 2 via ORAL
  Administered 2015-05-12: 1 via ORAL
  Filled 2015-05-11 (×2): qty 2
  Filled 2015-05-11: qty 1

## 2015-05-11 MED ORDER — TRAMADOL HCL 50 MG PO TABS
50.0000 mg | ORAL_TABLET | Freq: Four times a day (QID) | ORAL | Status: DC
  Administered 2015-05-11 – 2015-05-17 (×15): 50 mg via ORAL
  Filled 2015-05-11 (×17): qty 1

## 2015-05-11 MED ORDER — MORPHINE SULFATE (PF) 2 MG/ML IV SOLN
1.0000 mg | INTRAVENOUS | Status: DC | PRN
  Administered 2015-05-11 – 2015-05-16 (×12): 2 mg via INTRAVENOUS
  Filled 2015-05-11 (×6): qty 1
  Filled 2015-05-11: qty 2
  Filled 2015-05-11 (×3): qty 1
  Filled 2015-05-11: qty 2
  Filled 2015-05-11: qty 1

## 2015-05-11 MED ORDER — ALBUTEROL SULFATE (2.5 MG/3ML) 0.083% IN NEBU
2.5000 mg | INHALATION_SOLUTION | Freq: Three times a day (TID) | RESPIRATORY_TRACT | Status: DC
  Administered 2015-05-11 – 2015-05-14 (×8): 2.5 mg via RESPIRATORY_TRACT
  Filled 2015-05-11 (×8): qty 3

## 2015-05-11 MED ORDER — FERROUS SULFATE 325 (65 FE) MG PO TABS
325.0000 mg | ORAL_TABLET | Freq: Every day | ORAL | Status: DC
  Administered 2015-05-11 – 2015-05-18 (×8): 325 mg via ORAL
  Filled 2015-05-11 (×8): qty 1

## 2015-05-11 NOTE — Progress Notes (Signed)
[name] Post-Op Note  Patient ID: Stacy Cook, female   DOB: 1936-03-26, 80 y.o.   MRN: [id number]  HISTORY: Pain is biggest issue.  Not short of breath.  Walked in halls today with her and she did well.  Removed Foley.    Filed Vitals:   05/11/15 0543 05/11/15 0824  BP: 144/45   Pulse: 73   Temp: 98.4 F (36.9 C)   Resp: 16 16     EXAM: Resp: Lungs are decreased bilaterally but more so on the right.  No respiratory distress, normal effort. Heart:  Regular with murmur unchanged  Wounds redressed.  All clean and dry.  JP drained 60 cc of sanguinous fluid.  Chest tube with small air leak.  Serous drainage.   ASSESSMENT: Doing well pod #2.  Walked in halls.     PLAN:   Will encourage physical therapy.  Will encourage ambulation. Will check on pathology. Will Discontinue Foley.     Nestor Lewandowsky, MD

## 2015-05-11 NOTE — Progress Notes (Signed)
Pt. Scored an 8 on the Rt protocol assessment scale. Her BS are clear on the left and diminished on the right. Nebulizer treatments are changed to TID.

## 2015-05-11 NOTE — Progress Notes (Signed)
Physical Therapy Treatment Patient Details Name: Stacy Cook MRN: [id number] DOB: 1935/11/07 Today's Date: 05/11/2015    History of Present Illness admitted for acute hospitalization status post thoracotomy with R lower lobectomy and mediastinal lymphadenectomy (05/09/15)    PT Comments    Patient with good mobility progression noted this date--able to complete OOB, transfers and gait (100') without assist device, cga/min assist from therapist.  Remains painful and guarded at R chest, but eager for participation as able.  Vitals stable and WFL throughout session. RN present to manage chest tube suction before/after session as needed to allow for patient mobility.   Follow Up Recommendations  Home health PT     Equipment Recommendations       Recommendations for Other Services       Precautions / Restrictions Precautions Precautions: Fall Precaution Comments: R chest tube, R chest JP drain Restrictions Weight Bearing Restrictions: No    Mobility  Bed Mobility Overal bed mobility: Needs Assistance Bed Mobility: Supine to Sit     Supine to sit: Min assist;Mod assist     General bed mobility comments: assist for truncal elevation  Transfers Overall transfer level: Needs assistance   Transfers: Sit to/from Stand Sit to Stand: Min guard            Ambulation/Gait Ambulation/Gait assistance: Min guard;Min assist Ambulation Distance (Feet): 100 Feet Assistive device: 1 person hand held assist       General Gait Details: reciprocal stepping pattern with fair cadence/gait speed; guarded trunk posturing with limited truncal rotation/arm swing.  Additional distance limited by fatigue; vitals remain stable and WFL.   Stairs            Wheelchair Mobility    Modified Rankin (Stroke Patients Only)       Balance Overall balance assessment: Needs assistance Sitting-balance support: No upper extremity supported;Feet supported Sitting balance-Leahy Scale:  Good     Standing balance support: No upper extremity supported Standing balance-Leahy Scale: Fair                      Cognition Arousal/Alertness: Awake/alert Behavior During Therapy: WFL for tasks assessed/performed Overall Cognitive Status: Within Functional Limits for tasks assessed                      Exercises Other Exercises Other Exercises: Standing LE therex, 1x10, AROM for muscular strength/endurance--heel raises, mini squats, forward/backward/lateral stepping.  All performed with R HHA, min assist from therapist.  Intermittent standing/seated rest periods as needed.    General Comments        Pertinent Vitals/Pain Pain Score: 5  Pain Location: R chest Pain Descriptors / Indicators: Aching;Spasm Pain Intervention(s): Limited activity within patient's tolerance;Monitored during session;Repositioned;PCA encouraged    Home Living                      Prior Function            PT Goals (current goals can now be found in the care plan section) Acute Rehab PT Goals Patient Stated Goal: "to get home" PT Goal Formulation: With patient/family Time For Goal Achievement: 05/24/15 Potential to Achieve Goals: Good Progress towards PT goals: Progressing toward goals    Frequency  Min 2X/week    PT Plan Current plan remains appropriate    Co-evaluation             End of Session   Activity Tolerance: Patient limited by pain  Patient left: in bed;with call bell/phone within reach;with family/visitor present;with bed alarm set     Time: 8182-9937 PT Time Calculation (min) (ACUTE ONLY): 27 min  Charges:  $Gait Training: 8-22 mins $Therapeutic Exercise: 8-22 mins                    G Codes:      Obera Stauch H. Owens Shark, PT, DPT, NCS 05/11/2015, 4:44 PM [phone]

## 2015-05-11 NOTE — Progress Notes (Addendum)
[facility] at [place] NAME: Stacy Cook    MR#:  [id number]  DATE OF BIRTH:  [birth year]  SUBJECTIVE:  CHIEF COMPLAINT:  No chief complaint on file.  Pain in chest main concern  REVIEW OF SYSTEMS:    Review of Systems  Constitutional: Negative for fever and chills.  HENT: Negative for sore throat.   Eyes: Negative for blurred vision, double vision and pain.  Respiratory: Positive for cough. Negative for hemoptysis, shortness of breath and wheezing.   Cardiovascular: Positive for chest pain. Negative for palpitations, orthopnea and leg swelling.  Gastrointestinal: Negative for heartburn, nausea, vomiting, abdominal pain, diarrhea and constipation.  Genitourinary: Negative for dysuria and hematuria.  Musculoskeletal: Negative for back pain and joint pain.  Skin: Negative for rash.  Neurological: Negative for sensory change, speech change, focal weakness and headaches.  Endo/Heme/Allergies: Does not bruise/bleed easily.  Psychiatric/Behavioral: Negative for depression. The patient is not nervous/anxious.       DRUG ALLERGIES:   Allergies  Allergen Reactions  . Statins     Joint pain  . Sulfa Antibiotics Nausea And Vomiting  . Pneumococcal Vaccine Itching    Hives and fever    VITALS:  Blood pressure 145/43, pulse 80, temperature 98.4 F (36.9 C), temperature source Oral, resp. rate 16, height '5\' 1"'$  (1.549 m), weight 59.512 kg (131 lb 3.2 oz), SpO2 100 %.  PHYSICAL EXAMINATION:   Physical Exam  GENERAL:  80 y.o.-year-old patient lying in the bed with no acute distress.  EYES: Pupils equal, round, reactive to light and accommodation. No scleral icterus. Extraocular muscles intact.  HEENT: Head atraumatic, normocephalic. Oropharynx and nasopharynx clear.  NECK:  Supple, no jugular venous distention. No thyroid enlargement, no tenderness.  LUNGS: Normal breath sounds bilaterally, no wheezing, rales, rhonchi. No use of  accessory muscles of respiration. Right chest tube CARDIOVASCULAR: S1, S2 normal. No murmurs, rubs, or gallops.  ABDOMEN: Soft, nontender, nondistended. Bowel sounds present. No organomegaly or mass.  EXTREMITIES: No cyanosis, clubbing or edema b/l.    NEUROLOGIC: Cranial nerves II through XII are intact. No focal Motor or sensory deficits b/l.   PSYCHIATRIC: The patient is alert and oriented x 3.  SKIN: No obvious rash, lesion, or ulcer.    LABORATORY PANEL:   CBC  Recent Labs Lab 05/11/15 0542  WBC 5.7  HGB 8.0*  HCT 23.7*  PLT 85*   ------------------------------------------------------------------------------------------------------------------  Chemistries   Recent Labs Lab 05/11/15 0542  NA 140  K 3.7  CL 109  CO2 27  GLUCOSE 117*  BUN 15  CREATININE 0.83  CALCIUM 7.7*   ------------------------------------------------------------------------------------------------------------------  Cardiac Enzymes No results for input(s): TROPONINI in the last 168 hours. ------------------------------------------------------------------------------------------------------------------  RADIOLOGY:  Dg Chest 1 View  05/09/2015  CLINICAL DATA:  Hypoxia, status post chest tube placement. EXAM: CHEST 1 VIEW COMPARISON:  May 03, 2015. FINDINGS: Interval placement of 2 right-sided chest tubes with no definite pneumothorax seen. Mild subcutaneous emphysema is seen over the right lateral chest wall. No significant pleural effusion is noted. Left lung is clear. Cardiomediastinal silhouette appears normal. IMPRESSION: Interval placement of 2 right-sided chest tubes. No definite pneumothorax is noted. Mild subcutaneous emphysema seen over right lateral chest wall. Electronically Signed   By: Marijo Conception, M.D.   On: 05/09/2015 15:58   Dg Shoulder 1v Right  05/10/2015  CLINICAL DATA:  Right shoulder pain, history of previous surgery EXAM: RIGHT SHOULDER - 1 VIEW COMPARISON:   05/13/2008  FINDINGS: Degenerative changes of the acromioclavicular joint are seen. No acute fracture or dislocation is noted. Right chest tubes are noted. No pneumothorax is seen. IMPRESSION: No acute bony abnormality is noted. Two right chest tubes are noted without pneumothorax. Electronically Signed   By: [name] Catalina M.D.   On: 05/10/2015 12:00   Dg Chest Port 1 View  05/10/2015  CLINICAL DATA:  Right lung surgery.  Chest tubes EXAM: PORTABLE CHEST 1 VIEW COMPARISON:  05/09/2015 FINDINGS: Two right chest tubes and right soft tissue chest wall drain again noted in place, unchanged. No visible pneumothorax. Right base atelectasis. Left lung is clear. Heart is normal size. No visible effusions. IMPRESSION: Postoperative changes on the right with right base atelectasis. No visible pneumothorax. Electronically Signed   By: Rolm Baptise M.D.   On: 05/10/2015 07:31     ASSESSMENT AND PLAN:   * Right lung mass status post lobectomy by surgical team. Status post chest tube. Management per primary team. She may need follow-up with oncology after having report from the pathology.  * Right side chest and shoulder pain Due to chest tube She had some old right shoulder surgery in the past. continue pain meds.  * Acute blood loss anemia Monitor Repeat labs in AM  * HTN Well controlled  * Diabetes Continue metformin and keep on insulin sliding scale coverage.  * Mild Thrombocytopenia Possibly due to cancer, monitor tomorrow.   All the records are reviewed and case discussed with Care Management/Social Workerr. Management plans discussed with the patient, family and they are in agreement.  CODE STATUS: FULL  DVT Prophylaxis: SCDs  TOTAL TIME TAKING CARE OF THIS PATIENT: 30 minutes.   POSSIBLE D/C IN 2-3 DAYS, DEPENDING ON CLINICAL CONDITION.   Hillary Bow R M.D on 05/11/2015 at 2:38 PM  Between 7am to 6pm - Pager - [phone]  After 6pm go to [email or web address] - password  [email or web address] [facility]  Office  [phone]  CC: Primary care physician; Adin Hector, MD    Note: This dictation was prepared with Dragon dictation along with smaller phrase technology. Any transcriptional errors that result from this process are unintentional.

## 2015-05-11 NOTE — Care Management Important Message (Signed)
Important Message  Patient Details  Name: Stacy Cook MRN: [id number] Date of Birth: 19-Aug-1935   Medicare Important Message Given:  Yes    Juliann Pulse A Shawndale Kilpatrick 05/11/2015, 10:43 AM

## 2015-05-11 NOTE — Care Management (Signed)
Patient move to [facility] from [facility].  Patient had right thoracotomy with lobectomy right lobe.  She lives alone and was independent in all her alds.  Denies issues accessing medical care or obtaining medications.  She is not on 02 at home.  She does have chest tube.  Physical therapy consult pending

## 2015-05-12 ENCOUNTER — Inpatient Hospital Stay: Payer: Medicare Other

## 2015-05-12 LAB — GLUCOSE, CAPILLARY
GLUCOSE-CAPILLARY: 107 mg/dL — AB (ref 65–99)
Glucose-Capillary: 105 mg/dL — ABNORMAL HIGH (ref 65–99)
Glucose-Capillary: 121 mg/dL — ABNORMAL HIGH (ref 65–99)
Glucose-Capillary: 111 mg/dL — ABNORMAL HIGH (ref 65–99)

## 2015-05-12 LAB — CBC
HEMATOCRIT: 25.3 % — AB (ref 35.0–47.0)
HEMOGLOBIN: 8.6 g/dL — AB (ref 12.0–16.0)
MCH: 29 pg (ref 26.0–34.0)
MCHC: 33.9 g/dL (ref 32.0–36.0)
MCV: 85.5 fL (ref 80.0–100.0)
Platelets: 107 10*3/uL — ABNORMAL LOW (ref 150–440)
RBC: 2.96 MIL/uL — AB (ref 3.80–5.20)
RDW: 13.3 % (ref 11.5–14.5)
WBC: 6.1 10*3/uL (ref 3.6–11.0)

## 2015-05-12 MED ORDER — PROMETHAZINE HCL 25 MG/ML IJ SOLN
12.5000 mg | Freq: Once | INTRAMUSCULAR | Status: AC
  Administered 2015-05-12: 12.5 mg via INTRAVENOUS
  Filled 2015-05-12: qty 1

## 2015-05-12 MED ORDER — POLYETHYLENE GLYCOL 3350 17 G PO PACK
17.0000 g | PACK | Freq: Every day | ORAL | Status: DC | PRN
  Filled 2015-05-12: qty 1

## 2015-05-12 MED ORDER — HYDROCODONE-ACETAMINOPHEN 7.5-325 MG PO TABS
1.0000 | ORAL_TABLET | ORAL | Status: DC | PRN
  Administered 2015-05-12 – 2015-05-14 (×3): 1 via ORAL
  Administered 2015-05-15 – 2015-05-17 (×6): 2 via ORAL
  Administered 2015-05-18 (×2): 1 via ORAL
  Filled 2015-05-12: qty 1
  Filled 2015-05-12: qty 2
  Filled 2015-05-12: qty 1
  Filled 2015-05-12 (×3): qty 2
  Filled 2015-05-12: qty 1
  Filled 2015-05-12 (×3): qty 2
  Filled 2015-05-12: qty 1

## 2015-05-12 NOTE — Progress Notes (Signed)
[place] at [place] NAME: Stacy Cook    MR#:  [id number]  DATE OF BIRTH:  1935-09-11  SUBJECTIVE:  CHIEF COMPLAINT:  No chief complaint on file.  Pain in chest improved. Chest tube in place  REVIEW OF SYSTEMS:    Review of Systems  Constitutional: Negative for fever and chills.  HENT: Negative for sore throat.   Eyes: Negative for blurred vision, double vision and pain.  Respiratory: Positive for cough. Negative for hemoptysis, shortness of breath and wheezing.   Cardiovascular: Positive for chest pain. Negative for palpitations, orthopnea and leg swelling.  Gastrointestinal: Negative for heartburn, nausea, vomiting, abdominal pain, diarrhea and constipation.  Genitourinary: Negative for dysuria and hematuria.  Musculoskeletal: Negative for back pain and joint pain.  Skin: Negative for rash.  Neurological: Negative for sensory change, speech change, focal weakness and headaches.  Endo/Heme/Allergies: Does not bruise/bleed easily.  Psychiatric/Behavioral: Negative for depression. The patient is not nervous/anxious.       DRUG ALLERGIES:   Allergies  Allergen Reactions  . Statins     Joint pain  . Sulfa Antibiotics Nausea And Vomiting  . Pneumococcal Vaccine Itching    Hives and fever    VITALS:  Blood pressure 143/58, pulse 77, temperature 97.5 F (36.4 C), temperature source Oral, resp. rate 18, height '5\' 1"'$  (1.549 m), weight 59.512 kg (131 lb 3.2 oz), SpO2 100 %.  PHYSICAL EXAMINATION:   Physical Exam  GENERAL:  80 y.o.-year-old patient lying in the bed with no acute distress.  EYES: Pupils equal, round, reactive to light and accommodation. No scleral icterus. Extraocular muscles intact.  HEENT: Head atraumatic, normocephalic. Oropharynx and nasopharynx clear.  NECK:  Supple, no jugular venous distention. No thyroid enlargement, no tenderness.  LUNGS: Normal breath sounds bilaterally, no wheezing, rales,  rhonchi. No use of accessory muscles of respiration. Right chest tube CARDIOVASCULAR: S1, S2 normal. No murmurs, rubs, or gallops.  ABDOMEN: Soft, nontender, nondistended. Bowel sounds present. No organomegaly or mass.  EXTREMITIES: No cyanosis, clubbing or edema b/l.    NEUROLOGIC: Cranial nerves II through XII are intact. No focal Motor or sensory deficits b/l.   PSYCHIATRIC: The patient is alert and oriented x 3.  SKIN: No obvious rash, lesion, or ulcer.    LABORATORY PANEL:   CBC  Recent Labs Lab 05/12/15 0635  WBC 6.1  HGB 8.6*  HCT 25.3*  PLT 107*   ------------------------------------------------------------------------------------------------------------------  Chemistries   Recent Labs Lab 05/11/15 0542  NA 140  K 3.7  CL 109  CO2 27  GLUCOSE 117*  BUN 15  CREATININE 0.83  CALCIUM 7.7*   ------------------------------------------------------------------------------------------------------------------  Cardiac Enzymes No results for input(s): TROPONINI in the last 168 hours. ------------------------------------------------------------------------------------------------------------------  RADIOLOGY:  Dg Chest 2 View  05/12/2015  CLINICAL DATA:  Status post lobectomy for right lung mass EXAM: CHEST  2 VIEW COMPARISON:  Portable chest x-ray of May 10, 2015. FINDINGS: The lungs are mildly hypoinflated. On the right there are 2 chest tubes demonstrated. There is no pneumothorax nor significant pleural effusion. There is a small caliber subcutaneous tubular structure on the right conchae compatible with a surgical drain. On the left there is no pleural effusion. The cardiac silhouette is mildly enlarged. The pulmonary vascularity is normal. The mediastinum is normal in width. IMPRESSION: Slightly decreased inflation of both lungs today. On the right the chest tubes are in reasonable position. There is no pneumothorax or pleural effusion. Electronically Signed   By:  David  Martinique M.D.   On: 05/12/2015 07:57     ASSESSMENT AND PLAN:   * Right lung mass status post lobectomy by surgical team. Status post chest tube. Management per primary team.  * Right side chest and shoulder pain Due to chest tube She had some old right shoulder surgery in the past. continue pain meds.  * Acute blood loss anemia Monitor Repeat labs in AM Transfuse if < 7  * HTN Well controlled  * Diabetes Continue metformin and keep on insulin sliding scale coverage.  * Mild Thrombocytopenia Possibly due to cancer, monitor tomorrow. Stable   All the records are reviewed and case discussed with Care Management/Social Workerr. Management plans discussed with the patient, family and they are in agreement.  CODE STATUS: FULL  DVT Prophylaxis: SCDs  TOTAL TIME TAKING CARE OF THIS PATIENT: 30 minutes.   POSSIBLE D/C IN 2-3 DAYS, DEPENDING ON CLINICAL CONDITION.   Hillary Bow R M.D on 05/12/2015 at 1:07 PM  Between 7am to 6pm - Pager - [phone]  After 6pm go to [email or web address] - password [email or web address] [facility]  Office  [phone]  CC: Primary care physician; Adin Hector, MD    Note: This dictation was prepared with Dragon dictation along with smaller phrase technology. Any transcriptional errors that result from this process are unintentional.

## 2015-05-12 NOTE — Progress Notes (Signed)
Pt JP drain became disengaged.  Output leaked onto bed--unable to measure.

## 2015-05-12 NOTE — Progress Notes (Signed)
Stacy Cook Inpatient Post-Op Note  Patient ID: Stacy Cook, female   DOB: [birth year], 80 y.o.   MRN: [id number]  HISTORY: Pain remains the biggest issue.  Ate reasonably well yesterday.  Walked several times.  JP and chest tube drainage is serous   Filed Vitals:   05/11/15 2109 05/12/15 0506  BP: 151/57 141/50  Pulse: 82 76  Temp: 97.7 F (36.5 C) 97.6 F (36.4 C)  Resp:       EXAM: Resp: Lungs are clear on left and decreased on right.  No respiratory distress, normal effort. Heart:  Regular with murmur Abd:  Abdomen is soft, non distended and non tender. No masses are palpable.  There is no rebound and no guarding.  Neurological: Alert and oriented to person, place, and time. Coordination normal.  Skin: Skin is warm and dry. No rash noted. No diaphoretic. No erythema. No pallor.  Psychiatric: Normal mood and affect. Normal behavior. Judgment and thought content normal.    ASSESSMENT: Chest xray looks good.  H and H is better today   PLAN:   Ambulation.  Reviewed path with patient.  Follow H and H    Nestor Lewandowsky, MD

## 2015-05-13 LAB — CBC WITH DIFFERENTIAL/PLATELET
BASOS PCT: 0 %
Basophils Absolute: 0 10*3/uL (ref 0–0.1)
EOS ABS: 0.1 10*3/uL (ref 0–0.7)
EOS PCT: 2 %
HCT: 26.7 % — ABNORMAL LOW (ref 35.0–47.0)
Hemoglobin: 9.2 g/dL — ABNORMAL LOW (ref 12.0–16.0)
Lymphocytes Relative: 18 %
Lymphs Abs: 1 10*3/uL (ref 1.0–3.6)
MCH: 30.1 pg (ref 26.0–34.0)
MCHC: 34.4 g/dL (ref 32.0–36.0)
MCV: 87.4 fL (ref 80.0–100.0)
MONO ABS: 0.4 10*3/uL (ref 0.2–0.9)
MONOS PCT: 7 %
Neutro Abs: 4.2 10*3/uL (ref 1.4–6.5)
Neutrophils Relative %: 73 %
Platelets: 121 10*3/uL — ABNORMAL LOW (ref 150–440)
RBC: 3.06 MIL/uL — ABNORMAL LOW (ref 3.80–5.20)
RDW: 13.7 % (ref 11.5–14.5)
WBC: 5.8 10*3/uL (ref 3.6–11.0)

## 2015-05-13 LAB — GLUCOSE, CAPILLARY
GLUCOSE-CAPILLARY: 95 mg/dL (ref 65–99)
Glucose-Capillary: 95 mg/dL (ref 65–99)
Glucose-Capillary: 106 mg/dL — ABNORMAL HIGH (ref 65–99)
Glucose-Capillary: 110 mg/dL — ABNORMAL HIGH (ref 65–99)

## 2015-05-13 MED ORDER — PROMETHAZINE HCL 25 MG/ML IJ SOLN
12.5000 mg | INTRAMUSCULAR | Status: DC | PRN
  Administered 2015-05-13: 12.5 mg via INTRAVENOUS
  Filled 2015-05-13: qty 1

## 2015-05-13 MED ORDER — BISACODYL 10 MG RE SUPP
10.0000 mg | Freq: Every day | RECTAL | Status: DC | PRN
  Administered 2015-05-13: 10 mg via RECTAL
  Filled 2015-05-13: qty 1

## 2015-05-13 NOTE — Progress Notes (Signed)
[facility] at [place] NAME: Stacy Cook    MR#:  [id number]  DATE OF BIRTH:  06-07-1935  SUBJECTIVE:  CHIEF COMPLAINT:  No chief complaint on file.  Pain in chest improved. Chest tube in place Some SOB/cough.  Patient got her diagnosis of cancer  From Dr. Genevive Bi  REVIEW OF SYSTEMS:    Review of Systems  Constitutional: Negative for fever and chills.  HENT: Negative for sore throat.   Eyes: Negative for blurred vision, double vision and pain.  Respiratory: Positive for cough. Negative for hemoptysis, shortness of breath and wheezing.   Cardiovascular: Positive for chest pain. Negative for palpitations, orthopnea and leg swelling.  Gastrointestinal: Negative for heartburn, nausea, vomiting, abdominal pain, diarrhea and constipation.  Genitourinary: Negative for dysuria and hematuria.  Musculoskeletal: Negative for back pain and joint pain.  Skin: Negative for rash.  Neurological: Negative for sensory change, speech change, focal weakness and headaches.  Endo/Heme/Allergies: Does not bruise/bleed easily.  Psychiatric/Behavioral: Negative for depression. The patient is not nervous/anxious.       DRUG ALLERGIES:   Allergies  Allergen Reactions  . Statins     Joint pain  . Sulfa Antibiotics Nausea And Vomiting  . Pneumococcal Vaccine Itching    Hives and fever    VITALS:  Blood pressure 145/53, pulse 83, temperature 98.3 F (36.8 C), temperature source Oral, resp. rate 18, height '5\' 1"'$  (1.549 m), weight 59.512 kg (131 lb 3.2 oz), SpO2 92 %.  PHYSICAL EXAMINATION:   Physical Exam  GENERAL:  80 y.o.-year-old patient lying in the bed with no acute distress.  EYES: Pupils equal, round, reactive to light and accommodation. No scleral icterus. Extraocular muscles intact.  HEENT: Head atraumatic, normocephalic. Oropharynx and nasopharynx clear.  NECK:  Supple, no jugular venous distention. No thyroid enlargement, no  tenderness.  LUNGS: Normal breath sounds bilaterally, no wheezing, rales, rhonchi. No use of accessory muscles of respiration. Right chest tube CARDIOVASCULAR: S1, S2 normal. No murmurs, rubs, or gallops.  ABDOMEN: Soft, nontender, nondistended. Bowel sounds present. No organomegaly or mass.  EXTREMITIES: No cyanosis, clubbing or edema b/l.    NEUROLOGIC: Cranial nerves II through XII are intact. No focal Motor or sensory deficits b/l.   PSYCHIATRIC: The patient is alert and oriented x 3.  SKIN: No obvious rash, lesion, or ulcer.    LABORATORY PANEL:   CBC  Recent Labs Lab 05/13/15 0459  WBC 5.8  HGB 9.2*  HCT 26.7*  PLT 121*   ------------------------------------------------------------------------------------------------------------------  Chemistries   Recent Labs Lab 05/11/15 0542  NA 140  K 3.7  CL 109  CO2 27  GLUCOSE 117*  BUN 15  CREATININE 0.83  CALCIUM 7.7*   ------------------------------------------------------------------------------------------------------------------  Cardiac Enzymes No results for input(s): TROPONINI in the last 168 hours. ------------------------------------------------------------------------------------------------------------------  RADIOLOGY:  Dg Chest 2 View  05/12/2015  CLINICAL DATA:  Status post lobectomy for right lung mass EXAM: CHEST  2 VIEW COMPARISON:  Portable chest x-ray of May 10, 2015. FINDINGS: The lungs are mildly hypoinflated. On the right there are 2 chest tubes demonstrated. There is no pneumothorax nor significant pleural effusion. There is a small caliber subcutaneous tubular structure on the right conchae compatible with a surgical drain. On the left there is no pleural effusion. The cardiac silhouette is mildly enlarged. The pulmonary vascularity is normal. The mediastinum is normal in width. IMPRESSION: Slightly decreased inflation of both lungs today. On the right the chest tubes are in reasonable  position.  There is no pneumothorax or pleural effusion. Electronically Signed   By: David  Martinique M.D.   On: 05/12/2015 07:57     ASSESSMENT AND PLAN:   * Right lung mass status post lobectomy by surgical team. Status post chest tube. Management per primary team.  * Right side chest and shoulder pain Due to chest tube She had some old right shoulder surgery in the past. continue pain meds.  * Acute blood loss anemia - stable Monitor Transfuse if < 7  * HTN Well controlled  * Diabetes Continue metformin and keep on insulin sliding scale coverage.  * Mild Thrombocytopenia Possibly due to cancer Stable   All the records are reviewed and case discussed with Care Management/Social Workerr. Management plans discussed with the patient, family and they are in agreement.  CODE STATUS: FULL  DVT Prophylaxis: SCDs  TOTAL TIME TAKING CARE OF THIS PATIENT: 30 minutes.    DM and HTN well controlled. No change in meds Willl sign off. Please call if any questions on On- Call pager at [phone]   Neita Carp M.D on 05/13/2015 at 11:08 AM  Between 7am to 6pm - Pager - [phone]  After 6pm go to [email or web address] - password [email or web address] [facility]  Office  [phone]  CC: Primary care physician; Adin Hector, MD    Note: This dictation was prepared with Dragon dictation along with smaller phrase technology. Any transcriptional errors that result from this process are unintentional.

## 2015-05-13 NOTE — Care Management (Signed)
PT has recommended home health PT.  Agency list left with patient. Will follow up with patient to see who she would like to use at time of discharge.

## 2015-05-13 NOTE — Addendum Note (Signed)
Addendum  created 05/13/15 1520 by Johnna Acosta, CRNA   Modules edited: Anesthesia Medication Administration

## 2015-05-13 NOTE — Progress Notes (Signed)
Patient ambulated around nurse's desk twice today. I have encouraged her to ambulate a third time but has refused due to wanting to spend time with family in the room. She states she will be agreeable to ambulating before bed.

## 2015-05-13 NOTE — Care Management Important Message (Signed)
Important Message  Patient Details  Name: Stacy Cook MRN: [id number] Date of Birth: September 25, 1935   Medicare Important Message Given:  Yes    Juliann Pulse A [name] 05/13/2015, 1:38 PM

## 2015-05-13 NOTE — Care Management (Signed)
Followup with with patient she would like to talk to additional family members to determine if she would like to discharge with home health

## 2015-05-14 ENCOUNTER — Inpatient Hospital Stay: Payer: Medicare Other

## 2015-05-14 LAB — GLUCOSE, CAPILLARY
GLUCOSE-CAPILLARY: 107 mg/dL — AB (ref 65–99)
GLUCOSE-CAPILLARY: 98 mg/dL (ref 65–99)
Glucose-Capillary: 81 mg/dL (ref 65–99)
Glucose-Capillary: 89 mg/dL (ref 65–99)

## 2015-05-14 MED ORDER — ALBUTEROL SULFATE (2.5 MG/3ML) 0.083% IN NEBU: 2.5000 mg | INHALATION_SOLUTION | Freq: Four times a day (QID) | RESPIRATORY_TRACT | Status: DC | PRN

## 2015-05-14 NOTE — Progress Notes (Signed)
80 yr old female POD#5 from Right lower lobectomy for adenocarcinoma.  Patient doing well, states pain improved now.  She was up and walking around yesterday.  She is eating well.  She states occasional nausea as she walking around but transient, denies any dizziness or lightheadedness.   Filed Vitals:   05/13/15 2210 05/14/15 0620  BP: 121/44 128/46  Pulse: 82 79  Temp: 98.2 F (36.8 C) 98.3 F (36.8 C)  Resp: 16 12   I/O last 3 completed shifts: In: 370 [P.O.:360; Other:10] Out: 15 [Urine:850; Drains:30; Chest Tube:140] Total I/O In: 240 [P.O.:240] Out: 35 [Drains:5; Chest Tube:30]  PE:  Gen: NAD Res: CTAB/L, Right thoracotomy site, incision c/d/i, CT in place with 30 cc serous drainage, no airleak; JP with sanginous fluid on 5cc out but some clots in tubing Ext: 2+ pulses  CBC Latest Ref Rng 05/13/2015 05/12/2015 05/11/2015  WBC 3.6 - 11.0 K/uL 5.8 6.1 5.7  Hemoglobin 12.0 - 16.0 g/dL 9.2(L) 8.6(L) 8.0(L)  Hematocrit 35.0 - 47.0 % 26.7(L) 25.3(L) 23.7(L)  Platelets 150 - 440 K/uL 121(L) 107(L) 85(L)   CMP Latest Ref Rng 05/11/2015 05/09/2015 05/03/2015  Glucose 65 - 99 mg/dL 117(H) 194(H) 92  BUN 6 - 20 mg/dL '15 14 19  '$ Creatinine 0.44 - 1.00 mg/dL 0.83 0.84 0.85  Sodium 135 - 145 mmol/L 140 139 140  Potassium 3.5 - 5.1 mmol/L 3.7 3.9 4.2  Chloride 101 - 111 mmol/L 109 108 108  CO2 22 - 32 mmol/L '27 25 25  '$ Calcium 8.9 - 10.3 mg/dL 7.7(L) 8.2(L) 9.6  Total Protein 6.5 - 8.1 g/dL - - 7.5  Total Bilirubin 0.3 - 1.2 mg/dL - - 0.8  Alkaline Phos 38 - 126 U/L - - 71  AST 15 - 41 U/L - - 21  ALT 14 - 54 U/L - - 14    CXR: small apical ptx today  A/P:  Patient doing well today, continue pain regimen Will have her continue pulm toilet with IS, will check Xray tomorrow AM, if lung fully inflated can d/c chest tube tomorrow, continue JP drain Encourage amubulation

## 2015-05-15 ENCOUNTER — Inpatient Hospital Stay: Payer: Medicare Other

## 2015-05-15 LAB — GLUCOSE, CAPILLARY
GLUCOSE-CAPILLARY: 75 mg/dL (ref 65–99)
GLUCOSE-CAPILLARY: 87 mg/dL (ref 65–99)
GLUCOSE-CAPILLARY: 104 mg/dL — AB (ref 65–99)
Glucose-Capillary: 91 mg/dL (ref 65–99)

## 2015-05-15 LAB — CBC
HCT: 26 % — ABNORMAL LOW (ref 35.0–47.0)
Hemoglobin: 8.8 g/dL — ABNORMAL LOW (ref 12.0–16.0)
MCH: 29.7 pg (ref 26.0–34.0)
MCHC: 33.9 g/dL (ref 32.0–36.0)
MCV: 87.6 fL (ref 80.0–100.0)
PLATELETS: 132 10*3/uL — AB (ref 150–440)
RBC: 2.97 MIL/uL — AB (ref 3.80–5.20)
RDW: 13.2 % (ref 11.5–14.5)
WBC: 4.9 10*3/uL (ref 3.6–11.0)

## 2015-05-15 MED ORDER — BISACODYL 10 MG RE SUPP
10.0000 mg | Freq: Once | RECTAL | Status: DC
  Filled 2015-05-15: qty 1

## 2015-05-15 NOTE — Progress Notes (Signed)
80 yr old female POD#6 from Right lower lobectomy for adenocarcinoma.  Patient doing well, states passing gas well now.  She is still getting nauseated as she get up and moves around.    Filed Vitals:   05/14/15 2007 05/15/15 0527  BP: 151/44 123/45  Pulse: 82 76  Temp: 98.3 F (36.8 C) 98.2 F (36.8 C)  Resp: 16 18   I/O last 3 completed shifts: In: 43 [P.O.:580] Out: 1985 [Urine:1750; Drains:35; Chest Tube:200] Total I/O In: 120 [P.O.:120] Out: 600 [Urine:600]  PE:  Gen: NAD Res: CTAB/L, Right thoracotomy site, incision c/d/i, CT in place with 200 cc serous drainage, airleak with coughing; JP with sanginous fluids and clots 35cc Ext: 2+ pulses  CBC Latest Ref Rng 05/15/2015 05/13/2015 05/12/2015  WBC 3.6 - 11.0 K/uL 4.9 5.8 6.1  Hemoglobin 12.0 - 16.0 g/dL 8.8(L) 9.2(L) 8.6(L)  Hematocrit 35.0 - 47.0 % 26.0(L) 26.7(L) 25.3(L)  Platelets 150 - 440 K/uL 132(L) 121(L) 107(L)   CMP Latest Ref Rng 05/11/2015 05/09/2015 05/03/2015  Glucose 65 - 99 mg/dL 117(H) 194(H) 92  BUN 6 - 20 mg/dL '15 14 19  '$ Creatinine 0.44 - 1.00 mg/dL 0.83 0.84 0.85  Sodium 135 - 145 mmol/L 140 139 140  Potassium 3.5 - 5.1 mmol/L 3.7 3.9 4.2  Chloride 101 - 111 mmol/L 109 108 108  CO2 22 - 32 mmol/L '27 25 25  '$ Calcium 8.9 - 10.3 mg/dL 7.7(L) 8.2(L) 9.6  Total Protein 6.5 - 8.1 g/dL - - 7.5  Total Bilirubin 0.3 - 1.2 mg/dL - - 0.8  Alkaline Phos 38 - 126 U/L - - 71  AST 15 - 41 U/L - - 21  ALT 14 - 54 U/L - - 14    CXR: apical ptx from 2.5 to 57m today  A/P:  Patient doing well today, continue pain regimen Placed chest tube to suction today, can take off for her to walk around, will get another CXR in AM

## 2015-05-15 NOTE — Progress Notes (Addendum)
Small amount of serosanguinous drainage from Chest Tube site.  Reinforced with gauze and tape.  Will continue to monitor.

## 2015-05-16 ENCOUNTER — Inpatient Hospital Stay: Payer: Medicare Other

## 2015-05-16 LAB — CBC
HCT: 27.8 % — ABNORMAL LOW (ref 35.0–47.0)
Hemoglobin: 9.4 g/dL — ABNORMAL LOW (ref 12.0–16.0)
MCH: 29.4 pg (ref 26.0–34.0)
MCHC: 33.7 g/dL (ref 32.0–36.0)
MCV: 87.2 fL (ref 80.0–100.0)
PLATELETS: 156 10*3/uL (ref 150–440)
RBC: 3.19 MIL/uL — ABNORMAL LOW (ref 3.80–5.20)
RDW: 13.8 % (ref 11.5–14.5)
WBC: 5.4 10*3/uL (ref 3.6–11.0)

## 2015-05-16 LAB — GLUCOSE, CAPILLARY
GLUCOSE-CAPILLARY: 91 mg/dL (ref 65–99)
GLUCOSE-CAPILLARY: 124 mg/dL — AB (ref 65–99)
Glucose-Capillary: 88 mg/dL (ref 65–99)
Glucose-Capillary: 112 mg/dL — ABNORMAL HIGH (ref 65–99)

## 2015-05-16 NOTE — Care Management (Signed)
Follow up with patient to see if she determined which home health agency she would like to use at time of discharge.  Patient would like to wait until day of discharge to decide for certain if she is going to move forward with initiating services

## 2015-05-16 NOTE — Progress Notes (Signed)
Did well yesterday.  Pain much better.  Eating better.  JP drained 30 cc last 24 hours CT drained about 150 cc last 24 hours No air leak seen  CXRay from today - no change from yesterday  CBC OK  Wounds all clean and dry.  Redressed.  Will check CXRay early AM and plan to clamp chest tube tomorrow and repeat CXRay later.    [employer].

## 2015-05-16 NOTE — Progress Notes (Signed)
Initial Nutrition Assessment     INTERVENTION:  Meals and snacks: cater to pt preferences Medical Nutrition Supplement Therapy: if unable to meet nutritional needs will add supplement   NUTRITION DIAGNOSIS:   Inadequate oral intake related to altered GI function as evidenced by  (nausea).    GOAL:   Patient will meet greater than or equal to 90% of their needs    MONITOR:    (Energy intake, Digestive system)  REASON FOR ASSESSMENT:   LOS    ASSESSMENT:      Pt s/p right lower lobectomy for adenocarcinoma. Chest tube in place.  Past Medical History  Diagnosis Date  . Diabetes mellitus without complication ([place])   . Hypertension   . Cataract   . Basal cell carcinoma of skin 2016  . Anemia   . Stomach tumor (benign)   . Murmur   . Arthritis     back  . Osteoporosis     Current Nutrition: ate 100% of lunch today, tolerating hamburgers well per pt.    Food/Nutrition-Related History: 6 days during admission decreased intake secondary to nausea, otherwise normal intake   Scheduled Medications:  . bisacodyl  10 mg Rectal Once  . cholecalciferol  1,000 Units Oral Daily  . ferrous sulfate  325 mg Oral Q breakfast  . insulin aspart  0-9 Units Subcutaneous TID WC  . losartan  100 mg Oral Daily  . metFORMIN  500 mg Oral BID WC  . traMADol  50 mg Oral 4 times per day       Electrolyte/Renal Profile and Glucose Profile:   Recent Labs Lab 05/09/15 1921 05/11/15 0542  NA 139 140  K 3.9 3.7  CL 108 109  CO2 25 27  BUN 14 15  CREATININE 0.84 0.83  CALCIUM 8.2* 7.7*  GLUCOSE 194* 117*    Gastrointestinal Profile: Last BM:2/5    Weight Change: stable wt    Diet Order:  Diet Carb Modified Fluid consistency:: Thin; Room service appropriate?: Yes  Skin:   reviewed   Height:   Ht Readings from Last 1 Encounters:  05/11/15 '5\' 1"'$  (1.549 m)    Weight:   Wt Readings from Last 1 Encounters:  05/11/15 131 lb 3.2 oz (59.512 kg)    Ideal  Body Weight:     BMI:  Body mass index is 24.8 kg/(m^2).   EDUCATION NEEDS:   No education needs identified at this time  LOW Care Level  Afton Mikelson B. Zenia Resides, [place], [place] (pager) Weekend/On-Call pager [phone])

## 2015-05-16 NOTE — Progress Notes (Signed)
PT Cancellation Note  Patient Details Name: Stacy Cook MRN: [id number] DOB: 03/25/1936   Cancelled Treatment:    Reason Eval/Treat Not Completed: Pain limiting ability to participate (Treatment attempted; per discussion with primary RN, recommends hold at this time due to high levels of pain.  Does report patient regularly ambulating with nursing.  Will continue efforts at later time/date)   Reyes Ivan. Owens Shark, PT, DPT, NCS 05/16/2015, 4:14 PM [phone]

## 2015-05-16 NOTE — Care Management Important Message (Signed)
Important Message  Patient Details  Name: Stacy Cook MRN: [id number] Date of Birth: 08/20/1935   Medicare Important Message Given:  Yes    Juliann Pulse A Manon Banbury 05/16/2015, 2:29 PM

## 2015-05-16 NOTE — Progress Notes (Signed)
Pt states she feels as though her blood sugars have been well controlled during her hospital stay.  She has been refusing her Metformin and would like to discuss with her PCP the possibility of no longer taking this medicine.  I spoke with the Diabetes Education Coordinator regarding the consequences of pt continuing to refuse the Metformin and based on the review/history of the patients blood glucose monitoring, the diabetes Education Coordinator feels as though the pt has a valid reason to not take at this time.  Will continue to monitor.

## 2015-05-17 ENCOUNTER — Inpatient Hospital Stay: Payer: Medicare Other

## 2015-05-17 LAB — GLUCOSE, CAPILLARY
GLUCOSE-CAPILLARY: 93 mg/dL (ref 65–99)
GLUCOSE-CAPILLARY: 93 mg/dL (ref 65–99)
Glucose-Capillary: 102 mg/dL — ABNORMAL HIGH (ref 65–99)
Glucose-Capillary: 101 mg/dL — ABNORMAL HIGH (ref 65–99)

## 2015-05-17 NOTE — Progress Notes (Signed)
Physical Therapy Treatment Patient Details Name: Stacy Cook MRN: [id number] DOB: 1936-03-13 Today's Date: 05/17/2015    History of Present Illness admitted for acute hospitalization status post thoracotomy with R lower lobectomy and mediastinal lymphadenectomy (05/09/15)    PT Comments    Patient with marked improvement in pain control, functional performance and overall activity tolerance.  Maintains sats >96% on RA (with chest tube clamped) throughout session.  Able to increase gait distance (440') and initiate stair training without difficulty. Per patient, anticipate possible chest tube removal next date.   Follow Up Recommendations  Home health PT     Equipment Recommendations       Recommendations for Other Services       Precautions / Restrictions Precautions Precautions: Fall Precaution Comments: R chest tube (clamped per Dr Genevive Bi 2/7), R chest JP drain Restrictions Weight Bearing Restrictions: No    Mobility  Bed Mobility Overal bed mobility: Modified Independent Bed Mobility: Supine to Sit;Sit to Supine     Supine to sit: Modified independent (Device/Increase time) Sit to supine: Modified independent (Device/Increase time)   General bed mobility comments: use of bedrails to assist with movement transition  Transfers Overall transfer level: Needs assistance   Transfers: Sit to/from Stand Sit to Stand: Supervision            Ambulation/Gait Ambulation/Gait assistance: Supervision Ambulation Distance (Feet): 440 Feet Assistive device: None       General Gait Details: reciprocal stepping pattern with slow, but steady, cadence and overall gait speed.  Less guarded, more fluid than noted during previous sessions.  Tolerating distance without LOB, safety concerns; maintaining vitals >96% on RA throughout.   Stairs Stairs: Yes Stairs assistance: Min guard Stair Management: One rail Right;No rails Number of Stairs: 6 General stair comments: patient  self-selecting step to gait pattern without buckling or LOB; good LE strength and control noted.  Educated regarding hand placement and guarding/assist technique for negotiation over stairs at home (no rails, son will assist); patient voiced understanding.  Wheelchair Mobility    Modified Rankin (Stroke Patients Only)       Balance                                    Cognition Arousal/Alertness: Awake/alert Behavior During Therapy: WFL for tasks assessed/performed Overall Cognitive Status: Within Functional Limits for tasks assessed                      Exercises      General Comments        Pertinent Vitals/Pain Pain Assessment: Faces Faces Pain Scale: Hurts a little bit Pain Descriptors / Indicators: Aching Pain Intervention(s): Limited activity within patient's tolerance;Monitored during session;Repositioned;Premedicated before session    Home Living                      Prior Function            PT Goals (current goals can now be found in the care plan section) Acute Rehab PT Goals Patient Stated Goal: "to get home" PT Goal Formulation: With patient/family Time For Goal Achievement: 05/24/15 Potential to Achieve Goals: Good Progress towards PT goals: Progressing toward goals    Frequency  Min 2X/week    PT Plan Current plan remains appropriate (may progress to no PT needs)    Co-evaluation  End of Session   Activity Tolerance: Patient tolerated treatment well Patient left: in bed;with call bell/phone within reach;with family/visitor present;with bed alarm set     Time: 1740-8144 PT Time Calculation (min) (ACUTE ONLY): 18 min  Charges:  $Gait Training: 8-22 mins                    G Codes:      Smokey Melott H. Owens Shark, PT, DPT, NCS 05/17/2015, 4:50 PM [phone]

## 2015-05-18 ENCOUNTER — Inpatient Hospital Stay: Payer: Medicare Other

## 2015-05-18 LAB — GLUCOSE, CAPILLARY: GLUCOSE-CAPILLARY: 107 mg/dL — AB (ref 65–99)

## 2015-05-18 MED ORDER — HYDROCODONE-ACETAMINOPHEN 7.5-325 MG PO TABS: 1.0000 | ORAL_TABLET | ORAL | Status: DC | PRN

## 2015-05-18 MED ORDER — FERROUS SULFATE 325 (65 FE) MG PO TABS: 325.0000 mg | ORAL_TABLET | Freq: Every day | ORAL | Status: DC

## 2015-05-18 MED ORDER — ALBUTEROL SULFATE (2.5 MG/3ML) 0.083% IN NEBU: 2.5000 mg | INHALATION_SOLUTION | Freq: Four times a day (QID) | RESPIRATORY_TRACT | Status: DC | PRN

## 2015-05-18 NOTE — Discharge Summary (Signed)
Physician Discharge Summary  Patient ID: Stacy Cook MRN: [id number] DOB/AGE: 78/31/1937 78 y.o.  Admit date: 05/09/2015 Discharge date: 05/18/2015   Discharge Diagnoses:  Active Problems:   Lung mass   Procedures:right thoracotomy and right lower lobectomy  Hospital Course: Did well postop.  Chest tubes removed sequentially.  JP drain removed as well.  No postop complications.  Disposition: home  Discharge Instructions    Diet - low sodium heart healthy    Complete by:  As directed      Discharge wound care:    Complete by:  As directed   Leave dressing on for 48 hours then remove.  Wash over wounds with soap and water.     Increase activity slowly    Complete by:  As directed             Medication List    STOP taking these medications        metFORMIN 500 MG tablet  Commonly known as:  GLUCOPHAGE      TAKE these medications        albuterol (2.5 MG/3ML) 0.083% nebulizer solution  Commonly known as:  PROVENTIL  Take 3 mLs (2.5 mg total) by nebulization every 6 (six) hours as needed for wheezing or shortness of breath.     aspirin 81 MG tablet  Take 81 mg by mouth daily.     cholecalciferol 1000 units tablet  Commonly known as:  VITAMIN D  Take 1,000 Units by mouth daily.     ferrous sulfate 325 (65 FE) MG tablet  Take 1 tablet (325 mg total) by mouth daily with breakfast.     Fish Oil 1000 MG Caps  Take by mouth.     HYDROcodone-acetaminophen 7.5-325 MG tablet  Commonly known as:  NORCO  Take 1-2 tablets by mouth every 4 (four) hours as needed for moderate pain or severe pain.     losartan 100 MG tablet  Commonly known as:  COZAAR  Take 100 mg by mouth.     V-R VITAMIN B-12 500 MCG tablet  Generic drug:  cyanocobalamin  Take by mouth.         Nestor Lewandowsky, MD

## 2015-05-18 NOTE — Plan of Care (Signed)
Problem: Bowel/Gastric: Goal: Will not experience complications related to bowel motility Outcome: Not Progressing Pt denies passing flatulence or having a BM.

## 2015-05-18 NOTE — Care Management (Signed)
Patient to discharge today.  Order for home health PT.  Verified with patient that she does not have a preference on agency.  Referral made with [facility].  St[facility] Cook with [facility] notified of pending discharge.

## 2015-05-18 NOTE — Care Management Important Message (Signed)
Important Message  Patient Details  Name: Stacy Cook MRN: [id number] Date of Birth: 11-25-1935   Medicare Important Message Given:  Yes    Juliann Pulse A Quintasha Gren 05/18/2015, 9:45 AM

## 2015-05-18 NOTE — Progress Notes (Signed)
[facility] Post-[facility] Note  Patient ID: BEVAN VU, female   DOB: [birth year], 80 y.o.   MRN: [id number]  HISTORY: Did well overnight.  No fever.  Pain under good control.   Filed Vitals:   05/17/15 2310 05/18/15 0649  BP: 122/48 148/50  Pulse:  80  Temp:  97.9 F (36.6 C)  Resp:       EXAM: Resp: Lungs are clear bilaterally.  No respiratory distress, normal effort. Heart:  Regular with murmurs Abd:  Abdomen is soft, non distended and non tender. No masses are palpable.  There is no rebound and no guarding.  Neurological: Alert and oriented to person, place, and time. Coordination normal.  Skin: Skin is warm and dry. No rash noted. No diaphoretic. No erythema. No pallor.  Psychiatric: Normal mood and affect. Normal behavior. Judgment and thought content normal.   JP drained less than 15 cc over last 24 hours  Independent review of CXRay from this morning shows no change with the chest tube clamped for almost 24 hours.  ASSESSMENT: Stable CXRay with tube clamped.`   PLAN:   Chest tube removed.  JP removed.  Discharge instructions given.      Nestor Lewandowsky, MD

## 2015-05-18 NOTE — Progress Notes (Signed)
05/18/2015 11:47 AM  BP 148/50 mmHg  Pulse 80  Temp(Src) 97.9 F (36.6 C) (Oral)  Resp 17  Ht '5\' 1"'$  (1.549 m)  Wt 59.512 kg (131 lb 3.2 oz)  BMI 24.80 kg/m2  SpO2 99%. Patient discharged per MD orders. Dressing dry and intact. Discharge instructions reviewed with patient and patient verbalized understanding. IV's removed per policy. Prescriptions discussed and given to patient. Discharged via wheelchair escorted by volunteers.  Almedia Balls, RN

## 2015-05-19 ENCOUNTER — Inpatient Hospital Stay: Payer: Medicare Other | Attending: Cardiothoracic Surgery | Admitting: Cardiothoracic Surgery

## 2015-05-19 DIAGNOSIS — Z7982 Long term (current) use of aspirin: Secondary | ICD-10-CM | POA: Insufficient documentation

## 2015-05-19 DIAGNOSIS — I10 Essential (primary) hypertension: Secondary | ICD-10-CM | POA: Insufficient documentation

## 2015-05-19 DIAGNOSIS — C3431 Malignant neoplasm of lower lobe, right bronchus or lung: Secondary | ICD-10-CM | POA: Insufficient documentation

## 2015-05-19 DIAGNOSIS — Z7984 Long term (current) use of oral hypoglycemic drugs: Secondary | ICD-10-CM | POA: Insufficient documentation

## 2015-05-19 DIAGNOSIS — Z862 Personal history of diseases of the blood and blood-forming organs and certain disorders involving the immune mechanism: Secondary | ICD-10-CM | POA: Insufficient documentation

## 2015-05-19 DIAGNOSIS — Z85828 Personal history of other malignant neoplasm of skin: Secondary | ICD-10-CM | POA: Insufficient documentation

## 2015-05-19 DIAGNOSIS — Z8719 Personal history of other diseases of the digestive system: Secondary | ICD-10-CM | POA: Insufficient documentation

## 2015-05-19 DIAGNOSIS — M818 Other osteoporosis without current pathological fracture: Secondary | ICD-10-CM | POA: Insufficient documentation

## 2015-05-19 DIAGNOSIS — M25511 Pain in right shoulder: Secondary | ICD-10-CM | POA: Insufficient documentation

## 2015-05-19 DIAGNOSIS — M129 Arthropathy, unspecified: Secondary | ICD-10-CM | POA: Insufficient documentation

## 2015-05-19 DIAGNOSIS — R011 Cardiac murmur, unspecified: Secondary | ICD-10-CM | POA: Insufficient documentation

## 2015-05-19 DIAGNOSIS — Z79899 Other long term (current) drug therapy: Secondary | ICD-10-CM | POA: Insufficient documentation

## 2015-05-19 DIAGNOSIS — R0902 Hypoxemia: Secondary | ICD-10-CM | POA: Insufficient documentation

## 2015-05-19 DIAGNOSIS — J90 Pleural effusion, not elsewhere classified: Secondary | ICD-10-CM | POA: Insufficient documentation

## 2015-05-19 DIAGNOSIS — E119 Type 2 diabetes mellitus without complications: Secondary | ICD-10-CM | POA: Insufficient documentation

## 2015-05-26 ENCOUNTER — Other Ambulatory Visit: Payer: Self-pay | Admitting: *Deleted

## 2015-05-26 ENCOUNTER — Ambulatory Visit
Admission: RE | Admit: 2015-05-26 | Discharge: 2015-05-26 | Disposition: A | Discharge status: Home / Self Care | Payer: Medicare Other | Source: Ambulatory Visit | Attending: Cardiothoracic Surgery | Admitting: Cardiothoracic Surgery

## 2015-05-26 ENCOUNTER — Inpatient Hospital Stay: Payer: Medicare Other

## 2015-05-26 ENCOUNTER — Inpatient Hospital Stay: Payer: Medicare Other | Attending: Cardiothoracic Surgery | Admitting: Cardiothoracic Surgery

## 2015-05-26 VITALS — BP 126/54 | HR 76 | Temp 97.0°F | Wt 120.2 lb

## 2015-05-26 DIAGNOSIS — Z7982 Long term (current) use of aspirin: Secondary | ICD-10-CM | POA: Diagnosis not present

## 2015-05-26 DIAGNOSIS — Z9889 Other specified postprocedural states: Secondary | ICD-10-CM | POA: Insufficient documentation

## 2015-05-26 DIAGNOSIS — Z8719 Personal history of other diseases of the digestive system: Secondary | ICD-10-CM | POA: Diagnosis not present

## 2015-05-26 DIAGNOSIS — J90 Pleural effusion, not elsewhere classified: Secondary | ICD-10-CM | POA: Insufficient documentation

## 2015-05-26 DIAGNOSIS — M818 Other osteoporosis without current pathological fracture: Secondary | ICD-10-CM | POA: Diagnosis not present

## 2015-05-26 DIAGNOSIS — Z862 Personal history of diseases of the blood and blood-forming organs and certain disorders involving the immune mechanism: Secondary | ICD-10-CM | POA: Diagnosis not present

## 2015-05-26 DIAGNOSIS — C3431 Malignant neoplasm of lower lobe, right bronchus or lung: Secondary | ICD-10-CM | POA: Diagnosis present

## 2015-05-26 DIAGNOSIS — I10 Essential (primary) hypertension: Secondary | ICD-10-CM | POA: Diagnosis not present

## 2015-05-26 DIAGNOSIS — M129 Arthropathy, unspecified: Secondary | ICD-10-CM | POA: Diagnosis not present

## 2015-05-26 DIAGNOSIS — M25511 Pain in right shoulder: Secondary | ICD-10-CM | POA: Diagnosis not present

## 2015-05-26 DIAGNOSIS — R0902 Hypoxemia: Secondary | ICD-10-CM | POA: Diagnosis not present

## 2015-05-26 DIAGNOSIS — E119 Type 2 diabetes mellitus without complications: Secondary | ICD-10-CM | POA: Diagnosis not present

## 2015-05-26 DIAGNOSIS — R918 Other nonspecific abnormal finding of lung field: Secondary | ICD-10-CM | POA: Diagnosis present

## 2015-05-26 DIAGNOSIS — Z7984 Long term (current) use of oral hypoglycemic drugs: Secondary | ICD-10-CM | POA: Diagnosis not present

## 2015-05-26 DIAGNOSIS — R011 Cardiac murmur, unspecified: Secondary | ICD-10-CM | POA: Diagnosis not present

## 2015-05-26 DIAGNOSIS — Z85828 Personal history of other malignant neoplasm of skin: Secondary | ICD-10-CM | POA: Diagnosis not present

## 2015-05-26 DIAGNOSIS — Z79899 Other long term (current) drug therapy: Secondary | ICD-10-CM | POA: Diagnosis not present

## 2015-05-26 LAB — CBC WITH DIFFERENTIAL/PLATELET
BASOS PCT: 1 %
Basophils Absolute: 0.1 10*3/uL (ref 0–0.1)
EOS ABS: 0.1 10*3/uL (ref 0–0.7)
Eosinophils Relative: 2 %
HCT: 33.4 % — ABNORMAL LOW (ref 35.0–47.0)
HEMOGLOBIN: 11.4 g/dL — AB (ref 12.0–16.0)
LYMPHS ABS: 1.1 10*3/uL (ref 1.0–3.6)
Lymphocytes Relative: 16 %
MCH: 29.7 pg (ref 26.0–34.0)
MCHC: 34.2 g/dL (ref 32.0–36.0)
MCV: 86.8 fL (ref 80.0–100.0)
Monocytes Absolute: 0.5 10*3/uL (ref 0.2–0.9)
Monocytes Relative: 6 %
NEUTROS PCT: 75 %
Neutro Abs: 5.5 10*3/uL (ref 1.4–6.5)
PLATELETS: 188 10*3/uL (ref 150–440)
RBC: 3.85 MIL/uL (ref 3.80–5.20)
RDW: 14.7 % — AB (ref 11.5–14.5)
WBC: 7.3 10*3/uL (ref 3.6–11.0)

## 2015-05-26 NOTE — Progress Notes (Signed)
RN removed sutures and placed steri strips. Patient instructed to leave strips in place until they fall off.

## 2015-05-27 ENCOUNTER — Telehealth: Payer: Self-pay | Admitting: *Deleted

## 2015-05-27 NOTE — Progress Notes (Signed)
[facility] Post-Op Note  Patient ID: Stacy Cook, female   DOB: 10-15-1935, 80 y.o.   MRN: [id number]  HISTORY: She's done well since hospital discharge. She has had no fevers or chills. She has noticed some swelling underneath her incision. Her pain is under good control she has a pain management specialist that she sees provides her with her narcotics. She would like to continue her follow-up with him.   Filed Vitals:   05/26/15 1516  BP: 126/54  Pulse: 76  Temp: 97 F (36.1 C)     EXAM: Resp: Lungs are clear bilaterally but decreased on the right..  No respiratory distress, normal effort. Heart:  Regular with murmur of AS Abd:  Abdomen is soft, non distended and non tender. No masses are palpable.  There is no rebound and no guarding.  Neurological: Alert and oriented to person, place, and time. Coordination normal.  Skin: Skin is warm and dry. No rash noted. No diaphoretic. No erythema. No pallor.  sutures were removed today. There is a firm area under her incision. I suspect this is likely old blood. It is not fluctuant. There is no erythema.  Psychiatric: Normal mood and affect. Normal behavior. Judgment and thought content normal.    ASSESSMENT: She did have a chest x-ray which have independently reviewed. That looks okay. She'll come back next week to see our oncologist as well as myself for further wound check.   PLAN:   Return to clinic in one week    Nestor Lewandowsky, MD

## 2015-05-27 NOTE — Telephone Encounter (Signed)
Asking for a return call regarding her results yesterday

## 2015-06-03 ENCOUNTER — Inpatient Hospital Stay (HOSPITAL_BASED_OUTPATIENT_CLINIC_OR_DEPARTMENT_OTHER): Payer: Medicare Other | Admitting: Internal Medicine

## 2015-06-03 ENCOUNTER — Inpatient Hospital Stay (HOSPITAL_BASED_OUTPATIENT_CLINIC_OR_DEPARTMENT_OTHER): Payer: Medicare Other | Admitting: Cardiothoracic Surgery

## 2015-06-03 VITALS — BP 132/71 | HR 97 | Resp 16 | Ht 62.21 in | Wt 120.8 lb

## 2015-06-03 DIAGNOSIS — M129 Arthropathy, unspecified: Secondary | ICD-10-CM

## 2015-06-03 DIAGNOSIS — Z862 Personal history of diseases of the blood and blood-forming organs and certain disorders involving the immune mechanism: Secondary | ICD-10-CM

## 2015-06-03 DIAGNOSIS — C3431 Malignant neoplasm of lower lobe, right bronchus or lung: Secondary | ICD-10-CM

## 2015-06-03 DIAGNOSIS — Z79899 Other long term (current) drug therapy: Secondary | ICD-10-CM

## 2015-06-03 DIAGNOSIS — M25511 Pain in right shoulder: Secondary | ICD-10-CM

## 2015-06-03 DIAGNOSIS — Z85828 Personal history of other malignant neoplasm of skin: Secondary | ICD-10-CM

## 2015-06-03 DIAGNOSIS — E119 Type 2 diabetes mellitus without complications: Secondary | ICD-10-CM

## 2015-06-03 DIAGNOSIS — I10 Essential (primary) hypertension: Secondary | ICD-10-CM

## 2015-06-03 DIAGNOSIS — M818 Other osteoporosis without current pathological fracture: Secondary | ICD-10-CM

## 2015-06-03 DIAGNOSIS — R011 Cardiac murmur, unspecified: Secondary | ICD-10-CM

## 2015-06-03 DIAGNOSIS — Z7984 Long term (current) use of oral hypoglycemic drugs: Secondary | ICD-10-CM

## 2015-06-03 DIAGNOSIS — Z7982 Long term (current) use of aspirin: Secondary | ICD-10-CM

## 2015-06-03 DIAGNOSIS — C3411 Malignant neoplasm of upper lobe, right bronchus or lung: Secondary | ICD-10-CM

## 2015-06-03 DIAGNOSIS — R0902 Hypoxemia: Secondary | ICD-10-CM

## 2015-06-03 DIAGNOSIS — Z8719 Personal history of other diseases of the digestive system: Secondary | ICD-10-CM

## 2015-06-03 DIAGNOSIS — J90 Pleural effusion, not elsewhere classified: Secondary | ICD-10-CM

## 2015-06-03 NOTE — Progress Notes (Signed)
[facility] Post-[facility]  Patient ID: DAY GREB, female   DOB: 1935-09-09, 80 y.o.   MRN: [id number]  HISTORY: She returns today in follow-up. She states that she had some Valium tablets left over from a prior procedure and thought that that significantly improved the spasm she was having on her right chest. She asked for refill on those and I gave that to her. She's currently taking 5 mg every 8 hours of the Valium.   Filed Vitals:   06/03/15 0917  BP: 132/71  Pulse: 97  Resp: 16     EXAM:  Her wounds are clean dry and intact. The subcutaneous fluid collection is much improved. There is no erythema or drainage.  ASSESSMENT: Overall I think she is doing very well.   PLAN:   She met with our oncologist who did not recommend any additional therapy. I will see her back again in 2 months. At that point I will turn her care over to our oncologist.

## 2015-06-03 NOTE — Progress Notes (Signed)
Patient had right lower lobectomy by Dr. Genevive Bi on 05/09/15.  Her initial diagnosis was an incidental finding during a work up after a fall.  Since the surgery she has noticed a tickle in throat that causes a cough.

## 2015-06-03 NOTE — Progress Notes (Signed)
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  female patient without any history of smoking- incidentally noted to have right lower lobe lung nodule in November 2016 after she fell and hurt her ribs. This led to further workup including a PET scan- and finally ended up having a right lower lobectomy in January 2017. Patient is here to discuss further treatment options.  Patient is recuperating well from surgery. She denies any fevers or chills. No shortness of breath or cough. Appetite is improving. She is up and about by herself. She is not in pain.  ROS: A complete 10 point review of system is done which is negative except mentioned above in history of present illness  MEDICAL HISTORY:  Past Medical History  Diagnosis Date  . Diabetes mellitus without complication ([name])   . Hypertension   . Cataract   . Basal cell carcinoma of skin 2016  . Anemia   . Stomach tumor (benign)   . Murmur   . Arthritis     back  . Osteoporosis     SURGICAL HISTORY: Past Surgical History  Procedure Laterality Date  . Appendectomy    . Shoulder arthroscopy w/ capsular repair Right   . Abdominal hysterectomy    . Thoracotomy Right 05/09/2015    Procedure: THORACOTOMY, RIGHT LOWER LOBECTOMY, BRONCHOSCOPY;  Surgeon: Nestor Lewandowsky, MD;  Location: [facility] ORS;  Service: Thoracic;  Laterality: Right;    SOCIAL HISTORY: Social History   Social History  . Marital Status: Widowed    Spouse Name: N/A  . Number of Children: N/A  . Years of Education: N/A   Occupational History  . Not on file.   Social History Main Topics  . Smoking status: Never Smoker   . Smokeless tobacco: Not on file  . Alcohol  Use: No  . Drug Use: No  . Sexual Activity: Not on file   Other Topics Concern  . Not on file   Social History Narrative    FAMILY HISTORY: Family History  Problem Relation Age of Onset  . Diabetes Other     ALLERGIES:  is allergic to statins; sulfa antibiotics; and pneumococcal vaccine.  MEDICATIONS:  Current Outpatient Prescriptions  Medication Sig Dispense Refill  . albuterol (PROVENTIL) (2.5 MG/3ML) 0.083% nebulizer solution Take 3 mLs (2.5 mg total) by nebulization every 6 (six) hours as needed for wheezing or shortness of breath. 75 mL 12  . aspirin 81 MG tablet Take 81 mg by mouth daily. Reported on 06/03/2015    . cholecalciferol (VITAMIN D) 1000 UNITS tablet Take 1,000 Units by mouth daily.    . cyanocobalamin (V-R VITAMIN B-12) 500 MCG tablet Take by mouth.    . ferrous sulfate 325 (65 FE) MG tablet Take 1 tablet (325 mg total) by mouth daily with breakfast. 60 tablet 3  . HYDROcodone-acetaminophen (NORCO) 7.5-325 MG tablet Take 1-2 tablets by mouth every 4 (four) hours as needed for moderate pain or severe pain. 60 tablet 0  . losartan (COZAAR) 100 MG tablet Take 100 mg by mouth.    . metFORMIN (GLUCOPHAGE) 500 MG tablet Take 500 mg by mouth 2 (two) times daily with a meal.    . Omega-3 Fatty Acids (FISH OIL)  1000 MG CAPS Take by mouth.     No current facility-administered medications for this visit.      Marland Kitchen  PHYSICAL EXAMINATION: ECOG PERFORMANCE STATUS: 0 - Asymptomatic  There were no vitals filed for this visit. There were no vitals filed for this visit.  GENERAL: Well-nourished well-developed; Alert, no distress and comfortable.   Alone EYES: no pallor or icterus OROPHARYNX: no thrush or ulceration; good dentition  NECK: supple, no masses felt LYMPH:  no palpable lymphadenopathy in the cervical, axillary or inguinal regions LUNGS: clear to auscultation and  No wheeze or crackles HEART/CVS: regular rate & rhythm and no murmurs; No lower extremity  edema ABDOMEN: abdomen soft, non-tender and normal bowel sounds Musculoskeletal:no cyanosis of digits and no clubbing  PSYCH: alert & oriented x 3 with fluent speech NEURO: no focal motor/sensory deficits SKIN:  no rashes or significant lesions  LABORATORY DATA:  I have reviewed the data as listed Lab Results  Component Value Date   WBC 7.3 05/26/2015   HGB 11.4* 05/26/2015   HCT 33.4* 05/26/2015   MCV 86.8 05/26/2015   PLT 188 05/26/2015    Recent Labs  04/29/15 0910 05/03/15 0851 05/09/15 1921 05/11/15 0542  NA 139 140 139 140  K 4.5 4.2 3.9 3.7  CL 106 108 108 109  CO2 _0 GLUCOSE 107* 92 194* 117*  BUN _1 CREATININE 1.01* 0.85 0.84 0.83  CALCIUM 9.0 9.6 8.2* 7.7*  GFRNONAA 52* >60 >60 >60  GFRAA >60 >60 >60 >60  PROT 7.3 7.5  --   --   ALBUMIN 4.1 4.3  --   --   AST 21 21  --   --   ALT 17 14  --   --   ALKPHOS 75 71  --   --   BILITOT 0.7 0.8  --   --     RADIOGRAPHIC STUDIES: I have personally reviewed the radiological images as listed and agreed with the findings in the report. Dg Chest 1 View  05/09/2015  CLINICAL DATA:  Hypoxia, status post chest tube placement. EXAM: CHEST 1 VIEW COMPARISON:  May 03, 2015. FINDINGS: Interval placement of 2 right-sided chest tubes with no definite pneumothorax seen. Mild subcutaneous emphysema is seen over the right lateral chest wall. No significant pleural effusion is noted. Left lung is clear. Cardiomediastinal silhouette appears normal. IMPRESSION: Interval placement of 2 right-sided chest tubes. No definite pneumothorax is noted. Mild subcutaneous emphysema seen over right lateral chest wall. Electronically Signed   By: Marijo Conception, M.D.   On: 05/09/2015 15:58   Dg Chest 2 View  05/26/2015  CLINICAL DATA:  Followup for lung mass post biopsy, RIGHT lower lung and cancer mass removed 05/09/2015 EXAM: CHEST  2 VIEW COMPARISON:  05/18/2015 FINDINGS: Interval removal of surgical drain lateral RIGHT  chest wall. Upper normal heart size. Atherosclerotic calcification aorta. Mediastinal contours and pulmonary vascularity normal. Residual pleural effusion atelectasis at RIGHT lung base. Remaining lungs clear. No pneumothorax. Diffuse osseous demineralization. IMPRESSION: Postsurgical changes at RIGHT lung base with persistent atelectasis and small pleural effusion. No new abnormalities. Electronically Signed   By: Lavonia Dana M.D.   On: 05/26/2015 15:18   Dg Chest 2 View  05/18/2015  CLINICAL DATA:  80 year old female status post right lower lobectomy. Followup study to assess for chest tube placement. EXAM: CHEST  2 VIEW COMPARISON:  Chest x-ray 05/17/2015. FINDINGS: Lung volumes are slightly low. Linear opacities throughout  the lung bases bilaterally, compatible with some mild postoperative subsegmental atelectasis. Postoperative changes of right lower lobectomy are noted, with compensatory hyperexpansion of the right upper and middle lobes. Right-sided chest tube remains in position with tip in the superior aspect of the right hemithorax and side port within the right hemithorax. There continues to be a small right apical pneumothorax, approximately 5% of the volume of the right hemithorax, unchanged compared to the prior examination. Small to moderate right-sided pleural effusion, also unchanged. No evidence of pulmonary edema. Heart size is normal. Upper mediastinal contours are within normal limits. Atherosclerosis in the thoracic aorta. Skin staples and soft tissue drain in the right lateral chest wall, with small amount of adjacent soft tissue gas. IMPRESSION: 1. Postoperative changes and support apparatus redemonstrated, as above. No significant change in the position of the right-sided chest tube. Right-sided hydropneumothorax (small pneumothorax component and small to moderate effusion) is unchanged. Electronically Signed   By: Vinnie Langton M.D.   On: 05/18/2015 08:19   Dg Chest 2  View  05/17/2015  CLINICAL DATA:  Postop right lobectomy followup pneumothorax EXAM: CHEST  2 VIEW COMPARISON:  05/16/2015 FINDINGS: Stable right diaphragm elevation. Postsurgical drain lateral right thorax stable. Right chest tube again identified with tiny right apical pneumothorax stable. Subsegmental right base atelectasis stable. Mild discoid atelectasis left base unchanged. IMPRESSION: Stable tiny right apical pneumothorax Electronically Signed   By: Skipper Cliche M.D.   On: 05/17/2015 08:27   Dg Chest 2 View  05/16/2015  CLINICAL DATA:  Postop thoracotomy EXAM: CHEST  2 VIEW COMPARISON:  05/15/2015 FINDINGS: Right chest tube remains in place. Right apical small pneumothorax unchanged. Hydro pneumothorax in the right lung base unchanged. Mild right lower lobe atelectasis unchanged Mild left lower lobe atelectasis shows interval improvement. No heart failure. IMPRESSION: Small right apical pneumothorax unchanged. Hydro pneumothorax in the right lung base unchanged. Right chest tube remains in place. Electronically Signed   By: Franchot Gallo M.D.   On: 05/16/2015 08:13   Dg Chest 2 View  05/15/2015  CLINICAL DATA:  RIGHT-sided thoracotomy EXAM: CHEST  2 VIEW COMPARISON:  05/14/2015 FINDINGS: Normal cardiac silhouette. RIGHT-sided chest tube in place with small RIGHT apical pneumothorax remaining. The apical thorax measures 5 mm from the chest wall compared to 2.5 mm on prior. Mild RIGHT basilar atelectasis. LEFT lung is clear. IMPRESSION: Persistent small RIGHT apical pneumothorax with chest tube in place. Electronically Signed   By: Suzy Bouchard M.D.   On: 05/15/2015 08:48   Dg Chest 2 View  05/14/2015  CLINICAL DATA:  Right thoracotomy X 5 days. Chest tube on right. EXAM: CHEST  2 VIEW COMPARISON:  05/12/2015 FINDINGS: Normal cardiac silhouette. A chest tube remains at the RIGHT lung apex. Small apical pneumothorax persists with the pleural edge 3 mm from the apical lateral chest wall. Inferior  RIGHT chest tube removed. LEFT lung clear. IMPRESSION: Small RIGHT apical pneumothorax with chest tube in place. Electronically Signed   By: Suzy Bouchard M.D.   On: 05/14/2015 10:19   Dg Chest 2 View  05/12/2015  CLINICAL DATA:  Status post lobectomy for right lung mass EXAM: CHEST  2 VIEW COMPARISON:  Portable chest x-ray of May 10, 2015. FINDINGS: The lungs are mildly hypoinflated. On the right there are 2 chest tubes demonstrated. There is no pneumothorax nor significant pleural effusion. There is a small caliber subcutaneous tubular structure on the right conchae compatible with a surgical drain. On the left there is no  pleural effusion. The cardiac silhouette is mildly enlarged. The pulmonary vascularity is normal. The mediastinum is normal in width. IMPRESSION: Slightly decreased inflation of both lungs today. On the right the chest tubes are in reasonable position. There is no pneumothorax or pleural effusion. Electronically Signed   By: David  Martinique M.D.   On: 05/12/2015 07:57   Dg Shoulder 1v Right  05/10/2015  CLINICAL DATA:  Right shoulder pain, history of previous surgery EXAM: RIGHT SHOULDER - 1 VIEW COMPARISON:  05/13/2008 FINDINGS: Degenerative changes of the acromioclavicular joint are seen. No acute fracture or dislocation is noted. Right chest tubes are noted. No pneumothorax is seen. IMPRESSION: No acute bony abnormality is noted. Two right chest tubes are noted without pneumothorax. Electronically Signed   By: [name] Catalina M.D.   On: 05/10/2015 12:00   Dg Chest Port 1 View  05/10/2015  CLINICAL DATA:  Right lung surgery.  Chest tubes EXAM: PORTABLE CHEST 1 VIEW COMPARISON:  05/09/2015 FINDINGS: Two right chest tubes and right soft tissue chest wall drain again noted in place, unchanged. No visible pneumothorax. Right base atelectasis. Left lung is clear. Heart is normal size. No visible effusions. IMPRESSION: Postoperative changes on the right with right base atelectasis. No  visible pneumothorax. Electronically Signed   By: Rolm Baptise M.D.   On: 05/10/2015 07:31    ASSESSMENT & PLAN:  #  Right lung adenocarcinoma stage IB [ positive for  Visceral pleural invasion/ size 2.3 cm]  Status post lobectomy.  Reviewed the staging and pathology with the patient in detail.   #  Discussed that the risk of recurrence for stage I lung cancer is around 20%;  Visceral pleural invasion is a high-risk  Feature;  But overall the size of the tumor is still less than 4 cm;  Given her age-  I will recommend  Against any adjuvant chemotherapy.    # I recommend  Checking EGFR ALK/ Ros  Mutation;  As patient is a nonsmoker.   This information would be of benefit-  If patient does have  Any metastatic disease in the future.  For now I recommend checking  A chest CT in 6 months.   #  The above plan of care was discussed with the patient in detail.  I also spoke to Dr. Faith Rogue.  Thank you Dr. Faith Rogue for allowing me to participate in the care of your pleasant patient. Please do not hesitate to contact me with questions or concerns in the interim.     Cammie Sickle, MD 06/03/2015 12:47 PM

## 2015-06-06 DIAGNOSIS — Z85118 Personal history of other malignant neoplasm of bronchus and lung: Secondary | ICD-10-CM | POA: Insufficient documentation

## 2015-06-06 DIAGNOSIS — Z8529 Personal history of malignant neoplasm of other respiratory and intrathoracic organs: Secondary | ICD-10-CM | POA: Insufficient documentation

## 2015-06-06 HISTORY — DX: Personal history of other malignant neoplasm of bronchus and lung: Z85.118

## 2015-06-22 LAB — SURGICAL PATHOLOGY

## 2015-06-23 ENCOUNTER — Encounter: Payer: Self-pay | Admitting: Anatomic Pathology & Clinical Pathology

## 2015-06-30 ENCOUNTER — Telehealth: Payer: Self-pay | Admitting: *Deleted

## 2015-06-30 DIAGNOSIS — C3491 Malignant neoplasm of unspecified part of right bronchus or lung: Secondary | ICD-10-CM

## 2015-06-30 NOTE — Telephone Encounter (Signed)
Pt called and states she got nebulizer medicine from Dr. Genevive Bi and never got nebulizer machine.  She needs it to give herself the medicine.  She has called around and [facility] in [place] has nebulizer.  I spoke to Dr. Genevive Bi and he was agreeable for a machine and it was printed signed and entered dx for pt. Patient was contacted and she will come by and pick up the form today.

## 2015-06-30 NOTE — Telephone Encounter (Signed)
Patient has question about a prescription.  Please return call.

## 2015-06-30 NOTE — Addendum Note (Signed)
Addended by: Luella Cook on: 06/30/2015 02:27 PM   Modules accepted: Orders

## 2015-08-04 ENCOUNTER — Inpatient Hospital Stay: Payer: Medicare Other | Attending: Cardiothoracic Surgery | Admitting: Cardiothoracic Surgery

## 2015-08-04 ENCOUNTER — Ambulatory Visit
Admission: RE | Admit: 2015-08-04 | Discharge: 2015-08-04 | Disposition: A | Discharge status: Home / Self Care | Payer: Medicare Other | Source: Ambulatory Visit | Attending: Cardiothoracic Surgery | Admitting: Cardiothoracic Surgery

## 2015-08-04 ENCOUNTER — Ambulatory Visit
Admission: RE | Admit: 2015-08-04 | Discharge: 2015-08-04 | Disposition: A | Discharge status: Home / Self Care | Payer: Medicare Other | Source: Ambulatory Visit | Attending: Internal Medicine | Admitting: Internal Medicine

## 2015-08-04 VITALS — BP 162/71 | HR 72 | Temp 97.0°F | Wt 124.3 lb

## 2015-08-04 DIAGNOSIS — Z9889 Other specified postprocedural states: Secondary | ICD-10-CM | POA: Diagnosis not present

## 2015-08-04 DIAGNOSIS — R918 Other nonspecific abnormal finding of lung field: Secondary | ICD-10-CM | POA: Diagnosis not present

## 2015-08-04 DIAGNOSIS — C3411 Malignant neoplasm of upper lobe, right bronchus or lung: Secondary | ICD-10-CM | POA: Insufficient documentation

## 2015-08-04 DIAGNOSIS — C3490 Malignant neoplasm of unspecified part of unspecified bronchus or lung: Secondary | ICD-10-CM

## 2015-08-04 NOTE — Progress Notes (Signed)
[facility] Cook Inpatient Post-Op Note  Patient ID: Stacy Cook, female   DOB: 07/12/1935, 80 y.o.   MRN: [id number]  HISTORY: This patient returns today in follow-up. She has done well after right thoracotomy and lung resection. Only problem is that she complains of an occasional dry cough. She's used her inhalers but this doesn't seem to help her cough all that much. She scheduled to follow-up with her primary care doctor and I did discuss with her the possibility that her losartan may be causing her cough. Although not an ACE inhibitor it can occasionally have that side effect. She is otherwise getting along well. She has occasional postoperative pain but this has been mild. She is scheduled to follow-up with Dr. Ephraim Hamburger in 3 months.   Filed Vitals:   08/04/15 1052  BP: 162/71  Pulse: 72  Temp: 97 F (36.1 C)     EXAM: Resp: Lungs are clear bilaterally.  No respiratory distress, normal effort. Heart:  Regular with very loud systolic murmurs Abd:  Abdomen is soft, non distended and non tender. No masses are palpable.  There is no rebound and no guarding.  Neurological: Alert and oriented to person, place, and time. Coordination normal.  Skin: Skin is warm and dry. No rash noted. No diaphoretic. No erythema. No pallor.  Psychiatric: Normal mood and affect. Normal behavior. Judgment and thought content normal.    ASSESSMENT: She did have a chest x-ray made which shows some chronic postoperative changes but no acute findings overall she seems be progressing quite well.   PLAN:   She will continue her follow-up with our oncologist in 3 months.    Nestor Lewandowsky, MD

## 2015-08-04 NOTE — Progress Notes (Signed)
Patient here for follow up has persistent cough and still experiencing tenderness under breast and rt side.

## 2015-09-27 DIAGNOSIS — E1151 Type 2 diabetes mellitus with diabetic peripheral angiopathy without gangrene: Secondary | ICD-10-CM | POA: Insufficient documentation

## 2015-09-27 DIAGNOSIS — I739 Peripheral vascular disease, unspecified: Secondary | ICD-10-CM | POA: Insufficient documentation

## 2015-11-30 ENCOUNTER — Ambulatory Visit
Admission: RE | Admit: 2015-11-30 | Discharge: 2015-11-30 | Disposition: A | Discharge status: Home / Self Care | Payer: Medicare Other | Source: Ambulatory Visit | Attending: Internal Medicine | Admitting: Internal Medicine

## 2015-11-30 DIAGNOSIS — C3411 Malignant neoplasm of upper lobe, right bronchus or lung: Secondary | ICD-10-CM | POA: Diagnosis not present

## 2015-11-30 DIAGNOSIS — Z01812 Encounter for preprocedural laboratory examination: Secondary | ICD-10-CM | POA: Insufficient documentation

## 2015-11-30 DIAGNOSIS — I700 Atherosclerosis of aorta: Secondary | ICD-10-CM | POA: Diagnosis not present

## 2015-11-30 HISTORY — DX: Malignant neoplasm of unspecified part of right bronchus or lung: C34.91

## 2015-11-30 LAB — POCT I-STAT CREATININE: Creatinine, Ser: 1.1 mg/dL — ABNORMAL HIGH (ref 0.44–1.00)

## 2015-11-30 MED ORDER — IOPAMIDOL (ISOVUE-300) INJECTION 61%
75.0000 mL | Freq: Once | INTRAVENOUS | Status: AC | PRN
  Administered 2015-11-30: 75 mL via INTRAVENOUS

## 2015-12-02 ENCOUNTER — Encounter (INDEPENDENT_AMBULATORY_CARE_PROVIDER_SITE_OTHER): Payer: Self-pay

## 2015-12-02 ENCOUNTER — Inpatient Hospital Stay (HOSPITAL_BASED_OUTPATIENT_CLINIC_OR_DEPARTMENT_OTHER): Payer: Medicare Other | Admitting: Internal Medicine

## 2015-12-02 ENCOUNTER — Inpatient Hospital Stay: Payer: Medicare Other | Attending: Internal Medicine

## 2015-12-02 ENCOUNTER — Encounter: Payer: Self-pay | Admitting: Internal Medicine

## 2015-12-02 DIAGNOSIS — R011 Cardiac murmur, unspecified: Secondary | ICD-10-CM | POA: Insufficient documentation

## 2015-12-02 DIAGNOSIS — Z85118 Personal history of other malignant neoplasm of bronchus and lung: Secondary | ICD-10-CM | POA: Diagnosis not present

## 2015-12-02 DIAGNOSIS — D131 Benign neoplasm of stomach: Secondary | ICD-10-CM

## 2015-12-02 DIAGNOSIS — Z902 Acquired absence of lung [part of]: Secondary | ICD-10-CM

## 2015-12-02 DIAGNOSIS — E119 Type 2 diabetes mellitus without complications: Secondary | ICD-10-CM | POA: Insufficient documentation

## 2015-12-02 DIAGNOSIS — M129 Arthropathy, unspecified: Secondary | ICD-10-CM | POA: Insufficient documentation

## 2015-12-02 DIAGNOSIS — D649 Anemia, unspecified: Secondary | ICD-10-CM

## 2015-12-02 DIAGNOSIS — Z7984 Long term (current) use of oral hypoglycemic drugs: Secondary | ICD-10-CM | POA: Insufficient documentation

## 2015-12-02 DIAGNOSIS — Z79899 Other long term (current) drug therapy: Secondary | ICD-10-CM | POA: Diagnosis not present

## 2015-12-02 DIAGNOSIS — C3431 Malignant neoplasm of lower lobe, right bronchus or lung: Secondary | ICD-10-CM

## 2015-12-02 DIAGNOSIS — E042 Nontoxic multinodular goiter: Secondary | ICD-10-CM | POA: Diagnosis not present

## 2015-12-02 DIAGNOSIS — I10 Essential (primary) hypertension: Secondary | ICD-10-CM

## 2015-12-02 DIAGNOSIS — Z85828 Personal history of other malignant neoplasm of skin: Secondary | ICD-10-CM | POA: Insufficient documentation

## 2015-12-02 DIAGNOSIS — Z7982 Long term (current) use of aspirin: Secondary | ICD-10-CM | POA: Diagnosis not present

## 2015-12-02 DIAGNOSIS — M818 Other osteoporosis without current pathological fracture: Secondary | ICD-10-CM

## 2015-12-02 DIAGNOSIS — D696 Thrombocytopenia, unspecified: Secondary | ICD-10-CM

## 2015-12-02 DIAGNOSIS — C3411 Malignant neoplasm of upper lobe, right bronchus or lung: Secondary | ICD-10-CM

## 2015-12-02 DIAGNOSIS — I700 Atherosclerosis of aorta: Secondary | ICD-10-CM | POA: Diagnosis not present

## 2015-12-02 HISTORY — DX: Malignant neoplasm of lower lobe, right bronchus or lung: C34.31

## 2015-12-02 LAB — CBC WITH DIFFERENTIAL/PLATELET
Basophils Absolute: 0 10*3/uL (ref 0–0.1)
EOS ABS: 0.1 10*3/uL (ref 0–0.7)
HEMATOCRIT: 32.3 % — AB (ref 35.0–47.0)
Hemoglobin: 11.5 g/dL — ABNORMAL LOW (ref 12.0–16.0)
Lymphocytes Relative: 32 %
Lymphs Abs: 1.6 10*3/uL (ref 1.0–3.6)
MCH: 30.3 pg (ref 26.0–34.0)
MCHC: 35.5 g/dL (ref 32.0–36.0)
MCV: 85.4 fL (ref 80.0–100.0)
MONO ABS: 0.3 10*3/uL (ref 0.2–0.9)
Neutro Abs: 2.8 10*3/uL (ref 1.4–6.5)
Neutrophils Relative %: 57 %
PLATELETS: 95 10*3/uL — AB (ref 150–440)
RBC: 3.78 MIL/uL — ABNORMAL LOW (ref 3.80–5.20)
RDW: 13.8 % (ref 11.5–14.5)
WBC: 4.9 10*3/uL (ref 3.6–11.0)

## 2015-12-02 LAB — COMPREHENSIVE METABOLIC PANEL
ALBUMIN: 4.2 g/dL (ref 3.5–5.0)
ALT: 21 U/L (ref 14–54)
AST: 24 U/L (ref 15–41)
Alkaline Phosphatase: 64 U/L (ref 38–126)
Anion gap: 5 (ref 5–15)
BUN: 24 mg/dL — AB (ref 6–20)
CHLORIDE: 106 mmol/L (ref 101–111)
CO2: 28 mmol/L (ref 22–32)
CREATININE: 0.99 mg/dL (ref 0.44–1.00)
Calcium: 8.8 mg/dL — ABNORMAL LOW (ref 8.9–10.3)
GFR calc Af Amer: >60 mL/min (ref >60)
GFR, EST NON AFRICAN AMERICAN: 54 mL/min — AB (ref >60)
GLUCOSE: 101 mg/dL — AB (ref 65–99)
POTASSIUM: 4.8 mmol/L (ref 3.5–5.1)
Sodium: 139 mmol/L (ref 135–145)
Total Bilirubin: 0.6 mg/dL (ref 0.3–1.2)
Total Protein: 7 g/dL (ref 6.5–8.1)

## 2015-12-02 NOTE — Progress Notes (Signed)
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  female patient Stage I lung cancer adenocarcinoma is here for follow-up to review the results of her restaging CAT scan.   She denies any fevers or chills. Appetite is improving. No cough or shortness of breath or chest pain.  ROS: A complete 10 point review of system is done which is negative except mentioned above in history of present illness  MEDICAL HISTORY:  Past Medical History:  Diagnosis Date  . Anemia   . Arthritis    back  . Basal cell carcinoma of skin 2016  . Cancer of right lung ([place]) 05/09/2015   Dr. Genevive Bi performed Right lower lobe lobectomy.   . Cataract   . Diabetes mellitus without complication ([place])   . Hypertension   . Murmur   . Osteoporosis   . Stomach tumor (benign)     SURGICAL HISTORY: Past Surgical History:  Procedure Laterality Date  . ABDOMINAL HYSTERECTOMY    . APPENDECTOMY    . SHOULDER ARTHROSCOPY W/ CAPSULAR REPAIR Right   . THORACOTOMY Right 05/09/2015   Procedure: THORACOTOMY, RIGHT LOWER LOBECTOMY, BRONCHOSCOPY;  Surgeon: Nestor Lewandowsky, MD;  Location: [facility] ORS;  Service: Thoracic;  Laterality: Right;    SOCIAL HISTORY: Social History   Social History  . Marital status: Widowed    Spouse name: N/A  . Number of children: N/A  . Years of education: N/A   Occupational History  . Not on file.   Social History Main Topics  . Smoking status: Never Smoker  . Smokeless tobacco: Never Used   . Alcohol use No  . Drug use: No  . Sexual activity: Not on file   Other Topics Concern  . Not on file   Social History Narrative  . No narrative on file    FAMILY HISTORY: Family History  Problem Relation Age of Onset  . Diabetes Other     ALLERGIES:  is allergic to statins; sulfa antibiotics; and pneumococcal vaccine.  MEDICATIONS:  Current Outpatient Prescriptions  Medication Sig Dispense Refill  . albuterol (PROVENTIL) (2.5 MG/3ML) 0.083% nebulizer solution Take 3 mLs (2.5 mg total) by nebulization every 6 (six) hours as needed for wheezing or shortness of breath. 75 mL 12  . aspirin 81 MG tablet Take 81 mg by mouth daily. Reported on 06/03/2015    . cholecalciferol (VITAMIN D) 1000 UNITS tablet Take 1,000 Units by mouth daily.    . cyanocobalamin (V-R VITAMIN B-12) 500 MCG tablet Take by mouth.    Marland Kitchen HYDROcodone-acetaminophen (NORCO) 7.5-325 MG tablet Take 1-2 tablets by mouth every 4 (four) hours as needed for moderate pain or severe pain. 60 tablet 0  . losartan (COZAAR) 100 MG tablet Take 100 mg by mouth.    . metFORMIN (GLUCOPHAGE) 500 MG tablet Take 500 mg by mouth 2 (two) times daily with a meal.    . metoprolol succinate (TOPROL-XL) 25 MG 24 hr tablet Take by mouth.    Marland Kitchen  Omega-3 Fatty Acids (FISH OIL) 1000 MG CAPS Take by mouth.     No current facility-administered medications for this visit.       Marland Kitchen  PHYSICAL EXAMINATION: ECOG PERFORMANCE STATUS: 0 - Asymptomatic  Vitals:   12/02/15 1118  BP: (!) 148/70  Pulse: (!) 56  Resp: 17  Temp: 97.1 F (36.2 C)   Filed Weights   12/02/15 1118  Weight: 126 lb 3.2 oz (57.2 kg)    GENERAL: Well-nourished well-developed; Alert, no distress and comfortable.   Alone EYES: no pallor or icterus OROPHARYNX: no thrush or ulceration; good dentition  NECK: supple, no masses felt LYMPH:  no palpable lymphadenopathy in the cervical, axillary or inguinal regions LUNGS: clear to auscultation and  No wheeze or  crackles HEART/CVS: regular rate & rhythm and no murmurs; No lower extremity edema ABDOMEN: abdomen soft, non-tender and normal bowel sounds Musculoskeletal:no cyanosis of digits and no clubbing  PSYCH: alert & oriented x 3 with fluent speech NEURO: no focal motor/sensory deficits SKIN:  no rashes or significant lesions  LABORATORY DATA:  I have reviewed the data as listed Lab Results  Component Value Date   WBC 4.9 12/02/2015   HGB 11.5 (L) 12/02/2015   HCT 32.3 (L) 12/02/2015   MCV 85.4 12/02/2015   PLT 95 (L) 12/02/2015    Recent Labs  04/29/15 0910 05/03/15 0851 05/09/15 1921 05/11/15 0542 11/30/15 0955 12/02/15 1001  NA 139 140 139 140  --  139  K 4.5 4.2 3.9 3.7  --  4.8  CL 106 108 108 109  --  106  CO2 '27 25 25 27  '$ --  28  GLUCOSE 107* 92 194* 117*  --  101*  BUN '17 19 14 15  '$ --  24*  CREATININE 1.01* 0.85 0.84 0.83 1.10* 0.99  CALCIUM 9.0 9.6 8.2* 7.7*  --  8.8*  GFRNONAA 52* >60 >60 >60  --  54*  GFRAA >60 >60 >60 >60  --  >60  PROT 7.3 7.5  --   --   --  7.0  ALBUMIN 4.1 4.3  --   --   --  4.2  AST 21 21  --   --   --  24  ALT 17 14  --   --   --  21  ALKPHOS 75 71  --   --   --  64  BILITOT 0.7 0.8  --   --   --  0.6    RADIOGRAPHIC STUDIES: I have personally reviewed the radiological images as listed and agreed with the findings in the report. Ct Chest W Contrast  Result Date: 11/30/2015 CLINICAL DATA:  Restaging lung cancer. History of right thoracotomy and right lower lobe lobectomy. EXAM: CT CHEST WITH CONTRAST TECHNIQUE: Multidetector CT imaging of the chest was performed during intravenous contrast administration. CONTRAST:  92m ISOVUE-300 IOPAMIDOL (ISOVUE-300) INJECTION 61% COMPARISON:  Chest CT 02/18/2015 FINDINGS: Chest wall: No breast masses, supraclavicular or axillary lymphadenopathy. Multinodular thyroid goiter is again demonstrated. Cardiovascular: The heart is normal in size. No pericardial effusion. Advanced atherosclerotic calcifications  involving the aorta and branch vessels including the coronary arteries. No aneurysm or dissection. Mediastinum/Nodes: No mediastinal or hilar mass or lymphadenopathy. Small scattered lymph nodes are noted bilaterally. These are stable. New line the esophagus is grossly normal. Lungs/Pleura: Surgical changes from a right lower lobe lobectomy. No findings suspicious for residual or recurrent tumor. No findings suggest pulmonary metastatic disease. Right middle lobe scarring changes are  noted along with calcification. No acute overlying pulmonary process. No pleural effusion. Minimal loculated pleural fluid is noted in the right middle lobe and right lower lobe area. Upper Abdomen: No significant upper abdominal findings. No evidence of hepatic metastatic disease or adrenal gland metastatic disease. Advanced atherosclerotic calcifications involving the aorta and branch vessels. Musculoskeletal: No significant bony findings. IMPRESSION: 1. Status post right lower lobe lobectomy. No findings for residual/recurrent tumor or metastatic disease. 2. No mediastinal or hilar mass or adenopathy. 3. Stable advanced atherosclerotic calcifications involving the aorta and branch vessels. 4. No findings for upper abdominal metastatic disease. Electronically Signed   By: Marijo Sanes M.D.   On: 11/30/2015 11:50    ASSESSMENT & PLAN:  Primary malignant neoplasm of right lower lobe of lung ([place]) #  Right lung adenocarcinoma stage IB [ positive for  Visceral pleural invasion/ size 2.3 cm]  Status post lobectomy.  CT- NED.   # thrombocytopenia- intermittent-130s asymptomatic follow up.   # f/ in 6 months; cT prior/labs    Cammie Sickle, MD 12/02/2015 5:43 PM

## 2015-12-02 NOTE — Assessment & Plan Note (Addendum)
#    Right lung adenocarcinoma stage IB [ positive for  Visceral pleural invasion/ size 2.3 cm]  Status post lobectomy.  CT- NED.   # thrombocytopenia- intermittent-130s asymptomatic follow up.   # f/ in 6 months; cT prior/labs

## 2015-12-02 NOTE — Progress Notes (Signed)
CT last Wednesday

## 2016-02-06 ENCOUNTER — Other Ambulatory Visit (INDEPENDENT_AMBULATORY_CARE_PROVIDER_SITE_OTHER): Payer: Self-pay | Admitting: Vascular Surgery

## 2016-02-06 DIAGNOSIS — I6523 Occlusion and stenosis of bilateral carotid arteries: Secondary | ICD-10-CM

## 2016-02-07 ENCOUNTER — Encounter (INDEPENDENT_AMBULATORY_CARE_PROVIDER_SITE_OTHER): Payer: Self-pay | Admitting: Vascular Surgery

## 2016-02-07 ENCOUNTER — Ambulatory Visit (INDEPENDENT_AMBULATORY_CARE_PROVIDER_SITE_OTHER): Payer: Medicare Other

## 2016-02-07 ENCOUNTER — Encounter (INDEPENDENT_AMBULATORY_CARE_PROVIDER_SITE_OTHER): Payer: Self-pay

## 2016-02-07 ENCOUNTER — Ambulatory Visit (INDEPENDENT_AMBULATORY_CARE_PROVIDER_SITE_OTHER): Payer: Medicare Other | Admitting: Vascular Surgery

## 2016-02-07 VITALS — BP 171/60 | HR 65 | Resp 17 | Ht 61.5 in | Wt 128.0 lb

## 2016-02-07 DIAGNOSIS — I6523 Occlusion and stenosis of bilateral carotid arteries: Secondary | ICD-10-CM

## 2016-02-07 DIAGNOSIS — I10 Essential (primary) hypertension: Secondary | ICD-10-CM

## 2016-02-07 DIAGNOSIS — I6529 Occlusion and stenosis of unspecified carotid artery: Secondary | ICD-10-CM | POA: Insufficient documentation

## 2016-02-07 DIAGNOSIS — E119 Type 2 diabetes mellitus without complications: Secondary | ICD-10-CM | POA: Diagnosis not present

## 2016-02-07 DIAGNOSIS — E785 Hyperlipidemia, unspecified: Secondary | ICD-10-CM | POA: Diagnosis not present

## 2016-02-07 DIAGNOSIS — R918 Other nonspecific abnormal finding of lung field: Secondary | ICD-10-CM | POA: Diagnosis not present

## 2016-02-07 DIAGNOSIS — C3431 Malignant neoplasm of lower lobe, right bronchus or lung: Secondary | ICD-10-CM | POA: Diagnosis not present

## 2016-02-07 HISTORY — DX: Occlusion and stenosis of unspecified carotid artery: I65.29

## 2016-02-07 NOTE — Assessment & Plan Note (Signed)
lipid control important in reducing the progression of atherosclerotic disease. Continue statin therapy  

## 2016-02-07 NOTE — Progress Notes (Signed)
MRN : [id number]  Stacy Cook is a 80 y.o. (Nov 16, 1935) female who presents with chief complaint of  Chief Complaint  Patient presents with  . Follow-up  .  History of Present Illness: Patient follows up and return today for her carotid disease. She is doing well without specific complaints. She denies focal neurologic symptoms. Specifically, the patient denies amaurosis fugax, speech or swallowing difficulties, or arm or leg weakness or numbness Her carotid duplex today reveals stable 1-39% right ICA stenosis and stable 40-59% left ICA stenosis. This has not changed from her previous duplex study or her previous CT angiogram.  Current Outpatient Prescriptions  Medication Sig Dispense Refill  . albuterol (PROVENTIL) (2.5 MG/3ML) 0.083% nebulizer solution Take 3 mLs (2.5 mg total) by nebulization every 6 (six) hours as needed for wheezing or shortness of breath. 75 mL 12  . aspirin 81 MG tablet Take 81 mg by mouth daily. Reported on 06/03/2015    . cholecalciferol (VITAMIN D) 1000 UNITS tablet Take 1,000 Units by mouth daily.    . cyanocobalamin (V-R VITAMIN B-12) 500 MCG tablet Take by mouth.    Marland Kitchen HYDROcodone-acetaminophen (NORCO) 7.5-325 MG tablet Take 1-2 tablets by mouth every 4 (four) hours as needed for moderate pain or severe pain. 60 tablet 0  . losartan (COZAAR) 100 MG tablet Take 100 mg by mouth.    . metFORMIN (GLUCOPHAGE) 500 MG tablet Take 500 mg by mouth 2 (two) times daily with a meal.    . metoprolol succinate (TOPROL-XL) 25 MG 24 hr tablet Take by mouth.    . Omega-3 Fatty Acids (FISH OIL) 1000 MG CAPS Take by mouth.     No current facility-administered medications for this visit.     Past Medical History:  Diagnosis Date  . Anemia   . Arthritis    back  . Basal cell carcinoma of skin 2016  . Cancer of right lung ([place]) 05/09/2015   Dr. Genevive Bi performed Right lower lobe lobectomy.   . Cataract   . Diabetes mellitus without complication ([place])   . Hypertension   .  Murmur   . Osteoporosis   . Stomach tumor (benign)     Past Surgical History:  Procedure Laterality Date  . ABDOMINAL HYSTERECTOMY    . APPENDECTOMY    . SHOULDER ARTHROSCOPY W/ CAPSULAR REPAIR Right   . THORACOTOMY Right 05/09/2015   Procedure: THORACOTOMY, RIGHT LOWER LOBECTOMY, BRONCHOSCOPY;  Surgeon: Nestor Lewandowsky, MD;  Location: [facility] ORS;  Service: Thoracic;  Laterality: Right;    Social History Previous tobacco use No ETOH No IVDU   Family History Family History  Problem Relation Age of Onset  . Diabetes Other      Allergies  Allergen Reactions  . Statins     Joint pain  . Sulfa Antibiotics Nausea And Vomiting  . Pneumococcal Vaccine Itching    Hives and fever     REVIEW OF SYSTEMS (Negative unless checked)  Constitutional: '[]'$ Weight loss  '[]'$ Fever  '[]'$ Chills Cardiac: '[]'$ Chest pain   '[]'$ Chest pressure   '[]'$ Palpitations   '[]'$ Shortness of breath when laying flat   '[]'$ Shortness of breath at rest   '[]'$ Shortness of breath with exertion. Vascular:  '[]'$ Pain in legs with walking   '[]'$ Pain in legs at rest   '[]'$ Pain in legs when laying flat   '[]'$ Claudication   '[]'$ Pain in feet when walking  '[]'$ Pain in feet at rest  '[]'$ Pain in feet when laying flat   '[]'$ History of DVT   '[]'$ Phlebitis   '[]'$   Swelling in legs   '[]'$ Varicose veins   '[]'$ Non-healing ulcers Pulmonary:   '[]'$ Uses home oxygen   '[x]'$ Productive cough   '[]'$ Hemoptysis   '[]'$ Wheeze  '[]'$ COPD   '[]'$ Asthma Neurologic:  '[]'$ Dizziness  '[]'$ Blackouts   '[]'$ Seizures   '[]'$ History of stroke   '[]'$ History of TIA  '[]'$ Aphasia   '[]'$ Temporary blindness   '[]'$ Dysphagia   '[]'$ Weakness or numbness in arms   '[]'$ Weakness or numbness in legs Musculoskeletal:  '[]'$ Arthritis   '[]'$ Joint swelling   '[]'$ Joint pain   '[]'$ Low back pain Hematologic:  '[]'$ Easy bruising  '[]'$ Easy bleeding   '[]'$ Hypercoagulable state   '[]'$ Anemic  '[]'$ Hepatitis Gastrointestinal:  '[]'$ Blood in stool   '[]'$ Vomiting blood  '[]'$ Gastroesophageal reflux/heartburn   '[]'$ Difficulty swallowing. Genitourinary:  '[]'$ Chronic kidney disease   '[]'$ Difficult  urination  '[]'$ Frequent urination  '[]'$ Burning with urination   '[]'$ Blood in urine Skin:  '[]'$ Rashes   '[]'$ Ulcers   '[]'$ Wounds Psychological:  '[]'$ History of anxiety   '[]'$  History of major depression.  Physical Examination  Vitals:   02/07/16 1142 02/07/16 1143  BP: (!) 158/65 (!) 171/60  Pulse: 65   Resp: 17   Weight: 128 lb (58.1 kg)   Height: 5' 1.5" (1.562 m)    Body mass index is 23.79 kg/m. Gen:  WD/WN, NAD Head: [place]/AT, No temporalis wasting. Ear/Nose/Throat: Hearing grossly intact, nares w/o erythema or drainage, trachea midline Eyes: Conjunctiva clear. Sclera non-icteric Neck: Supple.  No bruit or JVD.  Pulmonary:  Good air movement, equal and clear to auscultation bilaterally.  Cardiac: RRR, normal S1, S2, loud systolic murmur. Vascular:  Vessel Right Left  Radial Palpable Palpable  Ulnar Palpable Palpable  Brachial Palpable Palpable  Carotid Palpable, with bruit Palpable, with bruit  Aorta Not palpable N/A  Femoral Palpable Palpable  Popliteal Palpable Palpable  PT Palpable Palpable  DP Palpable Palpable   Gastrointestinal: soft, non-tender/non-distended. No guarding/reflex.  Musculoskeletal: M/S 5/5 throughout.  No deformity or atrophy. Trace LE edema. Neurologic: CN 2-12 intact. Sensation grossly intact in extremities.  Symmetrical.  Speech is fluent. Motor exam as listed above. Psychiatric: Judgment intact, Mood & affect appropriate for pt's clinical situation. Dermatologic: No rashes or ulcers noted.  No cellulitis or open wounds. Lymph : No Cervical, Axillary, or Inguinal lymphadenopathy.     CBC Lab Results  Component Value Date   WBC 4.9 12/02/2015   HGB 11.5 (L) 12/02/2015   HCT 32.3 (L) 12/02/2015   MCV 85.4 12/02/2015   PLT 95 (L) 12/02/2015    BMET    Component Value Date/Time   NA 139 12/02/2015 1001   NA 141 07/21/2012 1529   K 4.8 12/02/2015 1001   K 3.9 07/21/2012 1529   CL 106 12/02/2015 1001   CL 109 (H) 07/21/2012 1529   CO2 28 12/02/2015  1001   CO2 27 07/21/2012 1529   GLUCOSE 101 (H) 12/02/2015 1001   GLUCOSE 104 (H) 07/21/2012 1529   BUN 24 (H) 12/02/2015 1001   BUN 16 07/21/2012 1529   CREATININE 0.99 12/02/2015 1001   CREATININE 1.11 09/03/2012 1535   CALCIUM 8.8 (L) 12/02/2015 1001   CALCIUM 8.9 07/21/2012 1529   GFRNONAA 54 (L) 12/02/2015 1001   GFRNONAA 49 (L) 09/03/2012 1535   GFRAA >60 12/02/2015 1001   GFRAA 57 (L) 09/03/2012 1535   CrCl cannot be calculated (Patient's most recent lab result is older than the maximum 21 days allowed.).  COAG Lab Results  Component Value Date   INR 0.99 05/03/2015   INR 1.00 04/29/2015    Radiology  No results found.    Assessment/Plan Lung mass Follows with thoracic surgery  Type 2 diabetes mellitus (HCC) blood glucose control important in reducing the progression of atherosclerotic disease. Also, involved in wound healing. On appropriate medications.   HLD (hyperlipidemia) lipid control important in reducing the progression of atherosclerotic disease. Continue statin therapy   Lung cancer ([place]) Follows with thoracic surgery  BP (high blood pressure) blood pressure control important in reducing the progression of atherosclerotic disease. On appropriate oral medications.   Carotid stenosis Her carotid duplex today reveals stable 1-39% right ICA stenosis and stable 40-59% left ICA stenosis. This has not changed from her previous duplex study or her previous CT angiogram. She has no focal symptoms. She is on aspirin therapy. We will plan to follow her on an annual basis with duplex.    Leotis Pain, MD  02/07/2016 2:48 PM    This note was created with Dragon medical transcription system.  Any errors from dictation are purely unintentional

## 2016-02-07 NOTE — Assessment & Plan Note (Signed)
blood pressure control important in reducing the progression of atherosclerotic disease. On appropriate oral medications.  

## 2016-02-07 NOTE — Assessment & Plan Note (Signed)
Her carotid duplex today reveals stable 1-39% right ICA stenosis and stable 40-59% left ICA stenosis. This has not changed from her previous duplex study or her previous CT angiogram. She has no focal symptoms. She is on aspirin therapy. We will plan to follow her on an annual basis with duplex.

## 2016-02-07 NOTE — Assessment & Plan Note (Signed)
blood glucose control important in reducing the progression of atherosclerotic disease. Also, involved in wound healing. On appropriate medications.  

## 2016-02-07 NOTE — Assessment & Plan Note (Signed)
Follows with thoracic surgery

## 2016-03-27 ENCOUNTER — Encounter
Admission: RE | Disposition: A | Discharge status: Home / Self Care | Payer: Self-pay | Source: Ambulatory Visit | Attending: Gastroenterology

## 2016-03-27 ENCOUNTER — Ambulatory Visit
Admission: RE | Admit: 2016-03-27 | Discharge: 2016-03-27 | Disposition: A | Discharge status: Home / Self Care | Payer: Medicare Other | Source: Ambulatory Visit | Attending: Gastroenterology | Admitting: Gastroenterology

## 2016-03-27 ENCOUNTER — Ambulatory Visit: Payer: Medicare Other | Admitting: Anesthesiology

## 2016-03-27 ENCOUNTER — Encounter: Payer: Self-pay | Admitting: *Deleted

## 2016-03-27 DIAGNOSIS — K259 Gastric ulcer, unspecified as acute or chronic, without hemorrhage or perforation: Secondary | ICD-10-CM | POA: Insufficient documentation

## 2016-03-27 DIAGNOSIS — I2510 Atherosclerotic heart disease of native coronary artery without angina pectoris: Secondary | ICD-10-CM | POA: Insufficient documentation

## 2016-03-27 DIAGNOSIS — M1990 Unspecified osteoarthritis, unspecified site: Secondary | ICD-10-CM | POA: Diagnosis not present

## 2016-03-27 DIAGNOSIS — K219 Gastro-esophageal reflux disease without esophagitis: Secondary | ICD-10-CM | POA: Insufficient documentation

## 2016-03-27 DIAGNOSIS — Z85118 Personal history of other malignant neoplasm of bronchus and lung: Secondary | ICD-10-CM | POA: Insufficient documentation

## 2016-03-27 DIAGNOSIS — I10 Essential (primary) hypertension: Secondary | ICD-10-CM | POA: Diagnosis not present

## 2016-03-27 DIAGNOSIS — Z79899 Other long term (current) drug therapy: Secondary | ICD-10-CM | POA: Diagnosis not present

## 2016-03-27 DIAGNOSIS — E119 Type 2 diabetes mellitus without complications: Secondary | ICD-10-CM | POA: Diagnosis not present

## 2016-03-27 DIAGNOSIS — K228 Other specified diseases of esophagus: Secondary | ICD-10-CM | POA: Insufficient documentation

## 2016-03-27 DIAGNOSIS — Z7982 Long term (current) use of aspirin: Secondary | ICD-10-CM | POA: Insufficient documentation

## 2016-03-27 DIAGNOSIS — K2960 Other gastritis without bleeding: Secondary | ICD-10-CM | POA: Diagnosis not present

## 2016-03-27 DIAGNOSIS — Z7984 Long term (current) use of oral hypoglycemic drugs: Secondary | ICD-10-CM | POA: Diagnosis not present

## 2016-03-27 DIAGNOSIS — Z85828 Personal history of other malignant neoplasm of skin: Secondary | ICD-10-CM | POA: Diagnosis not present

## 2016-03-27 HISTORY — PX: ESOPHAGOGASTRODUODENOSCOPY (EGD) WITH PROPOFOL: SHX5813

## 2016-03-27 LAB — GLUCOSE, CAPILLARY: Glucose-Capillary: 78 mg/dL (ref 65–99)

## 2016-03-27 SURGERY — ESOPHAGOGASTRODUODENOSCOPY (EGD) WITH PROPOFOL
Anesthesia: General

## 2016-03-27 MED ORDER — SODIUM CHLORIDE 0.9 % IV SOLN
INTRAVENOUS | Status: DC
  Administered 2016-03-27 (×2): via INTRAVENOUS

## 2016-03-27 MED ORDER — MIDAZOLAM HCL 5 MG/5ML IJ SOLN
INTRAMUSCULAR | Status: DC | PRN
  Administered 2016-03-27: 1 mg via INTRAVENOUS

## 2016-03-27 MED ORDER — SODIUM CHLORIDE 0.9 % IV SOLN: INTRAVENOUS | Status: DC

## 2016-03-27 MED ORDER — LIDOCAINE 2% (20 MG/ML) 5 ML SYRINGE
INTRAMUSCULAR | Status: DC | PRN
Start: 2016-03-27 — End: 2016-03-27
  Administered 2016-03-27: 40 mg via INTRAVENOUS

## 2016-03-27 MED ORDER — PROPOFOL 10 MG/ML IV BOLUS
INTRAVENOUS | Status: DC | PRN
  Administered 2016-03-27: 100 mg via INTRAVENOUS

## 2016-03-27 MED ORDER — PROPOFOL 500 MG/50ML IV EMUL
INTRAVENOUS | Status: DC | PRN
  Administered 2016-03-27: 140 ug/kg/min via INTRAVENOUS

## 2016-03-27 MED ORDER — FENTANYL CITRATE (PF) 100 MCG/2ML IJ SOLN
INTRAMUSCULAR | Status: DC | PRN
  Administered 2016-03-27: 50 ug via INTRAVENOUS

## 2016-03-27 NOTE — Transfer of Care (Signed)
Immediate Anesthesia Transfer of Care Note  Patient: Stacy Cook  Procedure(s) Performed: Procedure(s): ESOPHAGOGASTRODUODENOSCOPY (EGD) WITH PROPOFOL (N/A)  Patient Location: PACU and Endoscopy Unit  Anesthesia Type:General  Level of Consciousness: sedated  Airway & Oxygen Therapy: Patient Spontanous Breathing and Patient connected to nasal cannula oxygen  Post-op Assessment: Report given to RN and Post -op Vital signs reviewed and stable  Post vital signs: Reviewed and stable  Last Vitals:  Vitals:   03/27/16 0811  BP: (!) 172/51  Pulse: 63  Resp: 17  Temp: (!) 36 C    Last Pain:  Vitals:   03/27/16 0811  TempSrc: Tympanic         Complications: No apparent anesthesia complications

## 2016-03-27 NOTE — Anesthesia Postprocedure Evaluation (Signed)
Anesthesia Post Note  Patient: Stacy Cook  Procedure(s) Performed: Procedure(s) (LRB): ESOPHAGOGASTRODUODENOSCOPY (EGD) WITH PROPOFOL (N/A)  Patient location during evaluation: Endoscopy Anesthesia Type: General Level of consciousness: awake and alert Pain management: pain level controlled Vital Signs Assessment: post-procedure vital signs reviewed and stable Respiratory status: spontaneous breathing and respiratory function stable Cardiovascular status: stable Anesthetic complications: no     Last Vitals:  Vitals:   03/27/16 0940 03/27/16 0950  BP: (!) 138/54 (!) 147/59  Pulse: (!) 59 62  Resp: 12 13  Temp:      Last Pain:  Vitals:   03/27/16 0936  TempSrc: Tympanic                 KEPHART,WILLIAM K

## 2016-03-27 NOTE — Op Note (Signed)
[facility] Patient Name: Stacy Cook Procedure Date: 03/27/2016 9:00 AM MRN: [id number] Account #: [id number] Date of Birth: 07/12/1935 Admit Type: Outpatient Age: 80 Room: [facility] ENDO ROOM 3 Gender: Female Note Status: Finalized Procedure:            Upper GI endoscopy Indications:          Surveillance procedure, history of gastric mucosal                        carcinoid/NET Providers:            Lollie Sails, MD Referring MD:         Ramonita Lab, MD (Referring MD) Medicines:            Monitored Anesthesia Care Complications:        No immediate complications. Procedure:            Pre-Anesthesia Assessment:                       - ASA Grade Assessment: III - A patient with severe                        systemic disease.                       After obtaining informed consent, the endoscope was                        passed under direct vision. Throughout the procedure,                        the patient's blood pressure, pulse, and oxygen                        saturations were monitored continuously. The Endoscope                        was introduced through the mouth, and advanced to the                        third part of duodenum. The upper GI endoscopy was                        accomplished without difficulty. The patient tolerated                        the procedure well. Findings:      The Z-line was regular.      A single 2 mm mucosal nodule was found at the gastroesophageal junction.       Biopsies were taken with a cold forceps for histology.      The exam of the esophagus was otherwise normal.      Diffuse mild inflammation characterized by congestion (edema) and       erythema was found in the entire examined stomach. Biopsies were taken       with a cold forceps for histology.      One non-bleeding superficial gastric ulcer was found at the incisura.       The lesion was 2 mm in largest dimension. Biopsies were taken  with a       cold forceps for histology.  The cardia and gastric fundus were normal on retroflexion.      Diffuse mild mucosal variance characterized by altered texture was found       in the entire duodenum. Biopsies were taken with a cold forceps for       histology.      two tiny, about 1 mm mucosal nodules are noted, these removed and placed       in a separate jar      There is no evidence of submucosal lesions Impression:           - Z-line regular.                       - Mucosal nodule found in the esophagus. Biopsied.                       - Bile gastritis. Biopsied.                       - Non-bleeding gastric ulcer. Biopsied.                       - Mucosal variant in the duodenum. Biopsied. Recommendation:       - Discharge patient to home.                       - Use Aciphex (rabeprazole) 20 mg PO BID for 4 weeks.                       - Use Aciphex (rabeprazole) 20 mg PO daily daily.                       - Return to GI clinic in 4 weeks. Procedure Code(s):    --- Professional ---                       [phone], Esophagogastroduodenoscopy, flexible, transoral;                        with biopsy, single or multiple Diagnosis Code(s):    --- Professional ---                       K22.8, Other specified diseases of esophagus                       K29.60, Other gastritis without bleeding                       K25.9, Gastric ulcer, unspecified as acute or chronic,                        without hemorrhage or perforation                       K31.89, Other diseases of stomach and duodenum CPT copyright 2016 American Medical Association. All rights reserved. The codes documented in this report are preliminary and upon coder review may  be revised to meet current compliance requirements. Lollie Sails, MD 03/27/2016 9:34:58 AM This report has been signed electronically. Number of Addenda: 0 Note Initiated On: 03/27/2016 9:00 AM      [facility]

## 2016-03-27 NOTE — Anesthesia Preprocedure Evaluation (Signed)
Anesthesia Evaluation  Patient identified by MRN, date of birth, ID band Patient awake    Reviewed: Allergy & Precautions, NPO status , Patient's Chart, lab work & pertinent test results  History of Anesthesia Complications Negative for: history of anesthetic complications  Airway Mallampati: III       Dental   Pulmonary  S/p RLL resection          Cardiovascular hypertension, Pt. on medications and Pt. on home beta blockers + CAD  + Valvular Problems/Murmurs (murmur, no tx)      Neuro/Psych negative neurological ROS     GI/Hepatic Neg liver ROS, GERD  ,  Endo/Other  diabetes, Type 2, Oral Hypoglycemic Agents  Renal/GU negative Renal ROS     Musculoskeletal  (+) Arthritis , Osteoarthritis,    Abdominal   Peds  Hematology  (+) anemia ,   Anesthesia Other Findings   Reproductive/Obstetrics                             Anesthesia Physical Anesthesia Plan  ASA: III  Anesthesia Plan: General   Post-op Pain Management:    Induction: Intravenous  Airway Management Planned: Nasal Cannula  Additional Equipment:   Intra-op Plan:   Post-operative Plan:   Informed Consent: I have reviewed the patients History and Physical, chart, labs and discussed the procedure including the risks, benefits and alternatives for the proposed anesthesia with the patient or authorized representative who has indicated his/her understanding and acceptance.     Plan Discussed with:   Anesthesia Plan Comments:         Anesthesia Quick Evaluation

## 2016-03-27 NOTE — H&P (Signed)
Outpatient short stay form Pre-procedure 03/27/2016 9:00 AM Lollie Sails MD  Primary Physician: Dr. Ramonita Lab  Reason for visit:  EGD  History of present illness:  Patient is a 80 year old female presenting today as above. She has a personal history of gastric neuroendocrine lesion. Multiple of these mucosal level lesions had been removed previously on endoscopy and on her last procedures multiple biopsies were negative. She is presenting today for surveillance procedure. She does take 81 mg aspirin but has held that for about a week. She takes no other aspirin products or blood thinning agents.    Current Facility-Administered Medications:  .  0.9 %  sodium chloride infusion, , Intravenous, Continuous, Lollie Sails, MD, Last Rate: 20 mL/hr at 03/27/16 0827 .  0.9 %  sodium chloride infusion, , Intravenous, Continuous, Lollie Sails, MD  Prescriptions Prior to Admission  Medication Sig Dispense Refill Last Dose  . aspirin 81 MG tablet Take 81 mg by mouth daily. Reported on 06/03/2015   Past Week at Unknown time  . cholecalciferol (VITAMIN D) 1000 UNITS tablet Take 1,000 Units by mouth daily.   Past Week at Unknown time  . cyanocobalamin (V-R VITAMIN B-12) 500 MCG tablet Take by mouth.   Past Week at Unknown time  . HYDROcodone-acetaminophen (NORCO) 7.5-325 MG tablet Take 1-2 tablets by mouth every 4 (four) hours as needed for moderate pain or severe pain. 60 tablet 0 Past Month at Unknown time  . losartan (COZAAR) 100 MG tablet Take 100 mg by mouth.   03/26/2016 at 0800  . metFORMIN (GLUCOPHAGE) 500 MG tablet Take 500 mg by mouth 2 (two) times daily with a meal.   03/26/2016 at 0800  . metoprolol succinate (TOPROL-XL) 25 MG 24 hr tablet Take by mouth.   03/26/2016 at 1800  . Omega-3 Fatty Acids (FISH OIL) 1000 MG CAPS Take by mouth.   Past Week at Unknown time  . albuterol (PROVENTIL) (2.5 MG/3ML) 0.083% nebulizer solution Take 3 mLs (2.5 mg total) by nebulization every 6  (six) hours as needed for wheezing or shortness of breath. 75 mL 12 Taking     Allergies  Allergen Reactions  . Statins     Joint pain  . Sulfa Antibiotics Nausea And Vomiting  . Pneumococcal Vaccine Itching    Hives and fever     Past Medical History:  Diagnosis Date  . Anemia   . Arthritis    back  . Basal cell carcinoma of skin 2016  . Cancer of right lung ([place]) 05/09/2015   Dr. Genevive Bi performed Right lower lobe lobectomy.   . Cataract   . Diabetes mellitus without complication ([place])   . Hypertension   . Murmur   . Osteoporosis   . Stomach tumor (benign)     Review of systems:      Physical Exam    Heart and lungs: Regular rate and rhythm without rub or gallop lungs are bilaterally clear.    HEENT: Normocephalic atraumatic eyes are anicteric    Other:     Pertinant exam for procedure: Soft nontender nondistended bowel sounds positive normoactive    Planned proceedures: EGD and indicated procedures. I have discussed the risks benefits and complications of procedures to include not limited to bleeding, infection, perforation and the risk of sedation and the patient wishes to proceed.  She also stated that she has been having some abdominal discomfort in the lower half of the abdomen. Been no bowel habit change no black stools no  diarrhea no blood in the stool. It does not seem to be relieved with a bowel movement. I suggested to her after discussion that she trial about a month's worth of [facility] colon health. She is then to follow up with [place] as well in the outpatient clinic.      Lollie Sails, MD Gastroenterology 03/27/2016  9:00 AM

## 2016-03-28 ENCOUNTER — Encounter: Payer: Self-pay | Admitting: Gastroenterology

## 2016-03-28 ENCOUNTER — Other Ambulatory Visit: Payer: Self-pay | Admitting: Pathology

## 2016-03-30 ENCOUNTER — Telehealth: Payer: Self-pay

## 2016-03-30 NOTE — Telephone Encounter (Signed)
  Oncology Nurse Navigator Documentation Received referral from Dr. Gustavo Lah for EUS for gastric adenocarcinoma. No EUS availability at [facility] until end of January. After receiving permission for Stacy Cook, I have sent information to [facility] for scheduling. She will follow up with Dr. Rogue Bussing following. Navigator Location: [facility] (03/30/16 1200)   )Navigator Encounter Type: Telephone (03/30/16 1200) Telephone: Outgoing Call (03/30/16 1200)                       Barriers/Navigation Needs: Coordination of Care (03/30/16 1200)   Interventions: Coordination of Care (03/30/16 1200)   Coordination of Care: EUS (03/30/16 1200)        Acuity: Level 2 (03/30/16 1200)         Time Spent with Patient: 30 (03/30/16 1200)

## 2016-04-03 ENCOUNTER — Telehealth: Payer: Self-pay | Admitting: *Deleted

## 2016-04-03 NOTE — Telephone Encounter (Signed)
Received notice from sch. Team that the pt has a new onset of gastric cancer. The pt has a appt scheduled for 2\26\18, but needs to be seen sooner.  I asked scheduling team to have pt see Dr. Jacinto Reap next Tuesday in [place] at 1:30 pm.

## 2016-04-03 NOTE — Telephone Encounter (Signed)
rcvd msg from Dr. Marton Redwood nurse -Suanne Marker. Dr. Gwenlyn Perking would like to Dr. Rogue Bussing to contact him regarding this patient, when Dr. Rogue Bussing comes back from his vacation.

## 2016-04-05 NOTE — Progress Notes (Signed)
  Oncology Nurse Navigator Documentation EUS has been scheduled at [place] with Dr. Mont Dutton 12/29. Follow up for results and treatment plan with Dr. Rogue Bussing 04/10/16 as already scheduled. Navigator Location: [facility] (04/05/16 1100)   )Navigator Encounter Type: Other (04/05/16 1100) Telephone: Appt Confirmation/Clarification (04/05/16 1100)                                                  Time Spent with Patient: 15 (04/05/16 1100)

## 2016-04-10 ENCOUNTER — Inpatient Hospital Stay: Payer: Medicare Other | Attending: Internal Medicine | Admitting: Internal Medicine

## 2016-04-10 ENCOUNTER — Other Ambulatory Visit: Payer: Self-pay

## 2016-04-10 ENCOUNTER — Inpatient Hospital Stay: Payer: Medicare Other

## 2016-04-10 VITALS — BP 188/68 | HR 54 | Temp 97.7°F | Wt 127.1 lb

## 2016-04-10 DIAGNOSIS — Z902 Acquired absence of lung [part of]: Secondary | ICD-10-CM | POA: Insufficient documentation

## 2016-04-10 DIAGNOSIS — Z1231 Encounter for screening mammogram for malignant neoplasm of breast: Secondary | ICD-10-CM

## 2016-04-10 DIAGNOSIS — I119 Hypertensive heart disease without heart failure: Secondary | ICD-10-CM | POA: Insufficient documentation

## 2016-04-10 DIAGNOSIS — Z7982 Long term (current) use of aspirin: Secondary | ICD-10-CM

## 2016-04-10 DIAGNOSIS — E119 Type 2 diabetes mellitus without complications: Secondary | ICD-10-CM | POA: Insufficient documentation

## 2016-04-10 DIAGNOSIS — Z85828 Personal history of other malignant neoplasm of skin: Secondary | ICD-10-CM | POA: Diagnosis not present

## 2016-04-10 DIAGNOSIS — C160 Malignant neoplasm of cardia: Secondary | ICD-10-CM | POA: Insufficient documentation

## 2016-04-10 DIAGNOSIS — D649 Anemia, unspecified: Secondary | ICD-10-CM | POA: Insufficient documentation

## 2016-04-10 DIAGNOSIS — R011 Cardiac murmur, unspecified: Secondary | ICD-10-CM | POA: Diagnosis not present

## 2016-04-10 DIAGNOSIS — C3431 Malignant neoplasm of lower lobe, right bronchus or lung: Secondary | ICD-10-CM | POA: Insufficient documentation

## 2016-04-10 DIAGNOSIS — D696 Thrombocytopenia, unspecified: Secondary | ICD-10-CM

## 2016-04-10 DIAGNOSIS — N200 Calculus of kidney: Secondary | ICD-10-CM | POA: Diagnosis not present

## 2016-04-10 DIAGNOSIS — I517 Cardiomegaly: Secondary | ICD-10-CM | POA: Insufficient documentation

## 2016-04-10 DIAGNOSIS — M818 Other osteoporosis without current pathological fracture: Secondary | ICD-10-CM | POA: Diagnosis not present

## 2016-04-10 DIAGNOSIS — M129 Arthropathy, unspecified: Secondary | ICD-10-CM

## 2016-04-10 DIAGNOSIS — I10 Essential (primary) hypertension: Secondary | ICD-10-CM | POA: Diagnosis not present

## 2016-04-10 DIAGNOSIS — E042 Nontoxic multinodular goiter: Secondary | ICD-10-CM | POA: Insufficient documentation

## 2016-04-10 DIAGNOSIS — Z7984 Long term (current) use of oral hypoglycemic drugs: Secondary | ICD-10-CM | POA: Diagnosis not present

## 2016-04-10 DIAGNOSIS — Z79899 Other long term (current) drug therapy: Secondary | ICD-10-CM | POA: Diagnosis not present

## 2016-04-10 DIAGNOSIS — J90 Pleural effusion, not elsewhere classified: Secondary | ICD-10-CM | POA: Diagnosis not present

## 2016-04-10 HISTORY — DX: Malignant neoplasm of cardia: C16.0

## 2016-04-10 LAB — COMPREHENSIVE METABOLIC PANEL
ALT: 11 U/L — AB (ref 14–54)
ANION GAP: 8 (ref 5–15)
AST: 21 U/L (ref 15–41)
Albumin: 4.3 g/dL (ref 3.5–5.0)
Alkaline Phosphatase: 68 U/L (ref 38–126)
BUN: 16 mg/dL (ref 6–20)
CALCIUM: 9.2 mg/dL (ref 8.9–10.3)
CO2: 29 mmol/L (ref 22–32)
Chloride: 104 mmol/L (ref 101–111)
Creatinine, Ser: 1.06 mg/dL — ABNORMAL HIGH (ref 0.44–1.00)
GFR, EST AFRICAN AMERICAN: 57 mL/min — AB (ref >60)
GFR, EST NON AFRICAN AMERICAN: 49 mL/min — AB (ref >60)
Glucose, Bld: 88 mg/dL (ref 65–99)
Potassium: 4.3 mmol/L (ref 3.5–5.1)
Sodium: 141 mmol/L (ref 135–145)
TOTAL PROTEIN: 7.2 g/dL (ref 6.5–8.1)
Total Bilirubin: 0.8 mg/dL (ref 0.3–1.2)

## 2016-04-10 LAB — CBC WITH DIFFERENTIAL/PLATELET
BASOS ABS: 0 10*3/uL (ref 0–0.1)
BASOS PCT: 1 %
Eosinophils Absolute: 0.1 10*3/uL (ref 0–0.7)
Eosinophils Relative: 2 %
HEMATOCRIT: 33.7 % — AB (ref 35.0–47.0)
HEMOGLOBIN: 11.3 g/dL — AB (ref 12.0–16.0)
Lymphocytes Relative: 28 %
Lymphs Abs: 1.5 10*3/uL (ref 1.0–3.6)
MCH: 29.4 pg (ref 26.0–34.0)
MCHC: 33.6 g/dL (ref 32.0–36.0)
MCV: 87.5 fL (ref 80.0–100.0)
MONO ABS: 0.3 10*3/uL (ref 0.2–0.9)
Monocytes Relative: 6 %
NEUTROS PCT: 63 %
Neutro Abs: 3.4 10*3/uL (ref 1.4–6.5)
Platelets: 106 10*3/uL — ABNORMAL LOW (ref 150–440)
RBC: 3.85 MIL/uL (ref 3.80–5.20)
RDW: 12.6 % (ref 11.5–14.5)
WBC: 5.3 10*3/uL (ref 3.6–11.0)

## 2016-04-10 NOTE — Progress Notes (Signed)
  Oncology Nurse Navigator Documentation Spoke with Dr. Rogue Bussing regarding EUS pathology. Biopsy results from 12/29 gastric antrum and gastric antrum nodule are still in process at [facility]. Ms. Dermody was added to tumor board for 1/4 per Dr. Rogue Bussing. Navigator Location: [facility] (04/10/16 1300)   )Navigator Encounter Type: Diagnostic Results (04/10/16 1300)                                                    Time Spent with Patient: 15 (04/10/16 1300)

## 2016-04-10 NOTE — Assessment & Plan Note (Addendum)
#   Signet ring adenocarcinoma- gastric incisura; IHC stains pending. Interestingly EUS- does not show any significant abnormality. Awaiting on the pathology. Recommend mammogram; also recommend a PET scan for further staging. Discussed with Dr.Skulskie; and Dr.Burnbridge.   #  Right lung adenocarcinoma stage IB [ positive for  Visceral pleural invasion/ size 2.3 cm]  Status post lobectomy.   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 90-130s. Asymptomatic follow up. Check CBC today.   # Check CBC CMP CEA today. Follow-up with me few Days after the PET scan/mammogram. We'll also discuss at the tumor conference this week.

## 2016-04-10 NOTE — Progress Notes (Signed)
Patient here today for follow up.  Patient states no new concerns today  

## 2016-04-10 NOTE — Progress Notes (Signed)
[place] NOTE  Patient Care Team: Adin Hector, MD as PCP - General (Internal Medicine)  CHIEF COMPLAINTS/PURPOSE OF CONSULTATION:   Oncology History   # FEB 2017- ADENOCARCINOMA with Lepidic 80%-20% acinar pattern; pT2a [Stage IB;T-2.3cm; visceral pleural invasion present; pN=0 ]; AUG 2017- CT NED  # DEC 2017- [place] ca; signet ring [1.5 x103mgastric incisura; Dr.Skulskie]; EUS [Dr.Burnbridge]; no significant abnormality noted;  path pending.      Primary malignant neoplasm of right lower lobe of lung ([place]   12/02/2015 Initial Diagnosis    Primary malignant neoplasm of right lower lobe of lung (HCC)      Adenocarcinoma of gastric cardia ([place]   04/10/2016 Initial Diagnosis    Adenocarcinoma of gastric cardia ([facility]       HISTORY OF PRESENTING ILLNESS:  Stacy Cook 81y.o.  female patient Stage I lung cancer adenocarcinoma; Prior history of stomach carcinoid- is here to follow-up regarding a new diagnosis of "gastric cancer".   Patient underwent routine surveillance endoscopy that showed 1.5-3 mm ulcer at the gastric incisura which was biopsied- "adenocarcinoma signet ring type". Interestingly EUS did not reveal any significant morphologic abnormalities; biopsies are pending.   She denies any fevers or chills. Appetite is improving. No cough or shortness of breath or chest pain.  ROS: A complete 10 point review of system is done which is negative except mentioned above in history of present illness  MEDICAL HISTORY:  Past Medical History:  Diagnosis Date  . Anemia   . Arthritis    back  . Basal cell carcinoma of skin 2016  . Cancer of right lung ([place] 05/09/2015   Dr. OGenevive Biperformed Right lower lobe lobectomy.   . Cataract   . Diabetes mellitus without complication ([place]   . Hypertension   . Murmur   . Osteoporosis   . Stomach tumor (benign)     SURGICAL HISTORY: Past Surgical History:  Procedure Laterality Date  . ABDOMINAL  HYSTERECTOMY    . APPENDECTOMY    . ESOPHAGOGASTRODUODENOSCOPY (EGD) WITH PROPOFOL N/A 03/27/2016   Procedure: ESOPHAGOGASTRODUODENOSCOPY (EGD) WITH PROPOFOL;  Surgeon: MLollie Sails MD;  [facility];  Service: Endoscopy;  Laterality: N/A;  . SHOULDER ARTHROSCOPY W/ CAPSULAR REPAIR Right   . THORACOTOMY Right 05/09/2015   Procedure: THORACOTOMY, RIGHT LOWER LOBECTOMY, BRONCHOSCOPY;  Surgeon: TNestor Lewandowsky MD;  Location: [facility] ORS;  Service: Thoracic;  Laterality: Right;    SOCIAL HISTORY: Social History   Social History  . Marital status: Widowed    Spouse name: N/A  . Number of children: N/A  . Years of education: N/A   Occupational History  . Not on file.   Social History Main Topics  . Smoking status: Never Smoker  . Smokeless tobacco: Never Used  . Alcohol use No  . Drug use: No  . Sexual activity: Not on file   Other Topics Concern  . Not on file   Social History Narrative  . No narrative on file    FAMILY HISTORY: Family History  Problem Relation Age of Onset  . Diabetes Other     ALLERGIES:  is allergic to statins; sulfa antibiotics; and pneumococcal vaccine.  MEDICATIONS:  Current Outpatient Prescriptions  Medication Sig Dispense Refill  . albuterol (PROVENTIL) (2.5 MG/3ML) 0.083% nebulizer solution Take 3 mLs (2.5 mg total) by nebulization every 6 (six) hours as needed for wheezing or shortness of breath. 75 mL 12  . aspirin 81 MG tablet Take 81 mg  by mouth daily. Reported on 06/03/2015    . cholecalciferol (VITAMIN D) 1000 UNITS tablet Take 1,000 Units by mouth daily.    . cyanocobalamin (V-R VITAMIN B-12) 500 MCG tablet Take by mouth.    Marland Kitchen HYDROcodone-acetaminophen (NORCO) 7.5-325 MG tablet Take 1-2 tablets by mouth every 4 (four) hours as needed for moderate pain or severe pain. 60 tablet 0  . losartan (COZAAR) 100 MG tablet Take 100 mg by mouth.    . metFORMIN (GLUCOPHAGE) 500 MG tablet Take 500 mg by mouth 2 (two) times daily with a  meal.    . metoprolol succinate (TOPROL-XL) 25 MG 24 hr tablet Take 25 mg by mouth daily.     . Omega-3 Fatty Acids (FISH OIL) 1000 MG CAPS Take by mouth.    . Probiotic Product (PROBIOTIC DAILY PO) Take by mouth.    . RABEprazole (ACIPHEX) 20 MG tablet      No current facility-administered medications for this visit.       Marland Kitchen  PHYSICAL EXAMINATION: ECOG PERFORMANCE STATUS: 0 - Asymptomatic  Vitals:   04/10/16 1327  BP: (!) 188/68  Pulse: (!) 54  Temp: 97.7 F (36.5 C)   Filed Weights   04/10/16 1327  Weight: 127 lb 1.5 oz (57.6 kg)    GENERAL: Well-nourished well-developed; Alert, no distress and comfortable.   Alone EYES: no pallor or icterus OROPHARYNX: no thrush or ulceration; good dentition  NECK: supple, no masses felt LYMPH:  no palpable lymphadenopathy in the cervical, axillary or inguinal regions LUNGS: clear to auscultation and  No wheeze or crackles HEART/CVS: regular rate & rhythm and no murmurs; No lower extremity edema ABDOMEN: abdomen soft, non-tender and normal bowel sounds Musculoskeletal:no cyanosis of digits and no clubbing  PSYCH: alert & oriented x 3 with fluent speech NEURO: no focal motor/sensory deficits SKIN:  no rashes or significant lesions; Right and left BREAST exam [in the presence of nurse]- no unusual skin changes or dominant masses felt.    LABORATORY DATA:  I have reviewed the data as listed Lab Results  Component Value Date   WBC 5.3 04/10/2016   HGB 11.3 (L) 04/10/2016   HCT 33.7 (L) 04/10/2016   MCV 87.5 04/10/2016   PLT 106 (L) 04/10/2016    Recent Labs  05/03/15 0851  05/11/15 0542 11/30/15 0955 12/02/15 1001 04/10/16 1411  NA 140  < > 140  --  139 141  K 4.2  < > 3.7  --  4.8 4.3  CL 108  < > 109  --  106 104  CO2 25  < > 27  --  28 29  GLUCOSE 92  < > 117*  --  101* 88  BUN 19  < > 15  --  24* 16  CREATININE 0.85  < > 0.83 1.10* 0.99 1.06*  CALCIUM 9.6  < > 7.7*  --  8.8* 9.2  GFRNONAA >60  < > >60  --  54* 49*   GFRAA >60  < > >60  --  >60 57*  PROT 7.5  --   --   --  7.0 7.2  ALBUMIN 4.3  --   --   --  4.2 4.3  AST 21  --   --   --  24 21  ALT 14  --   --   --  21 11*  ALKPHOS 71  --   --   --  64 68  BILITOT 0.8  --   --   --  0.6 0.8  < > = values in this interval not displayed.  RADIOGRAPHIC STUDIES: I have personally reviewed the radiological images as listed and agreed with the findings in the report. No results found.  ASSESSMENT & PLAN:  Adenocarcinoma of gastric cardia ([place]) # Signet ring adenocarcinoma- gastric incisura; IHC stains pending. Interestingly EUS- does not show any significant abnormality. Awaiting on the pathology. Recommend mammogram; also recommend a PET scan for further staging. Discussed with Dr.Skulskie; and Dr.Burnbridge.   #  Right lung adenocarcinoma stage IB [ positive for  Visceral pleural invasion/ size 2.3 cm]  Status post lobectomy.   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 90-130s. Asymptomatic follow up. Check CBC today.   # Check CBC CMP CEA today. Follow-up with me few Days after the PET scan/mammogram. We'll also discuss at the tumor conference this week.     Cammie Sickle, MD 04/10/2016 5:21 PM

## 2016-04-11 DIAGNOSIS — Z85028 Personal history of other malignant neoplasm of stomach: Secondary | ICD-10-CM | POA: Insufficient documentation

## 2016-04-11 HISTORY — DX: Personal history of other malignant neoplasm of stomach: Z85.028

## 2016-04-11 LAB — CEA: CEA: 2.2 ng/mL (ref 0.0–4.7)

## 2016-04-12 ENCOUNTER — Inpatient Hospital Stay: Payer: Medicare Other

## 2016-04-13 LAB — SURGICAL PATHOLOGY

## 2016-04-17 ENCOUNTER — Ambulatory Visit
Admission: RE | Admit: 2016-04-17 | Discharge: 2016-04-17 | Disposition: A | Discharge status: Home / Self Care | Payer: Medicare Other | Source: Ambulatory Visit | Attending: Internal Medicine | Admitting: Internal Medicine

## 2016-04-17 DIAGNOSIS — N200 Calculus of kidney: Secondary | ICD-10-CM | POA: Diagnosis not present

## 2016-04-17 DIAGNOSIS — J90 Pleural effusion, not elsewhere classified: Secondary | ICD-10-CM | POA: Diagnosis not present

## 2016-04-17 DIAGNOSIS — I517 Cardiomegaly: Secondary | ICD-10-CM | POA: Insufficient documentation

## 2016-04-17 DIAGNOSIS — Z902 Acquired absence of lung [part of]: Secondary | ICD-10-CM | POA: Diagnosis not present

## 2016-04-17 DIAGNOSIS — I7090 Unspecified atherosclerosis: Secondary | ICD-10-CM | POA: Insufficient documentation

## 2016-04-17 DIAGNOSIS — C3431 Malignant neoplasm of lower lobe, right bronchus or lung: Secondary | ICD-10-CM | POA: Insufficient documentation

## 2016-04-17 DIAGNOSIS — C160 Malignant neoplasm of cardia: Secondary | ICD-10-CM | POA: Insufficient documentation

## 2016-04-17 DIAGNOSIS — Z1231 Encounter for screening mammogram for malignant neoplasm of breast: Secondary | ICD-10-CM

## 2016-04-17 LAB — GLUCOSE, CAPILLARY: Glucose-Capillary: 105 mg/dL — ABNORMAL HIGH (ref 65–99)

## 2016-04-17 MED ORDER — FLUDEOXYGLUCOSE F - 18 (FDG) INJECTION
12.0000 | Freq: Once | INTRAVENOUS | Status: AC | PRN
  Administered 2016-04-17: 13.08 via INTRAVENOUS

## 2016-04-18 ENCOUNTER — Ambulatory Visit
Admission: RE | Admit: 2016-04-18 | Discharge: 2016-04-18 | Disposition: A | Discharge status: Home / Self Care | Payer: Medicare Other | Source: Ambulatory Visit | Attending: Internal Medicine | Admitting: Internal Medicine

## 2016-04-18 ENCOUNTER — Other Ambulatory Visit: Payer: Self-pay | Admitting: Internal Medicine

## 2016-04-18 DIAGNOSIS — C160 Malignant neoplasm of cardia: Secondary | ICD-10-CM

## 2016-04-18 DIAGNOSIS — Z1231 Encounter for screening mammogram for malignant neoplasm of breast: Secondary | ICD-10-CM | POA: Insufficient documentation

## 2016-04-18 DIAGNOSIS — C3431 Malignant neoplasm of lower lobe, right bronchus or lung: Secondary | ICD-10-CM

## 2016-04-19 ENCOUNTER — Other Ambulatory Visit: Payer: Self-pay | Admitting: Internal Medicine

## 2016-04-19 DIAGNOSIS — R928 Other abnormal and inconclusive findings on diagnostic imaging of breast: Secondary | ICD-10-CM

## 2016-04-20 ENCOUNTER — Inpatient Hospital Stay (HOSPITAL_BASED_OUTPATIENT_CLINIC_OR_DEPARTMENT_OTHER): Payer: Medicare Other | Admitting: Internal Medicine

## 2016-04-20 ENCOUNTER — Encounter: Payer: Self-pay | Admitting: Internal Medicine

## 2016-04-20 VITALS — BP 167/61 | HR 56 | Temp 96.6°F | Wt 125.0 lb

## 2016-04-20 DIAGNOSIS — J90 Pleural effusion, not elsewhere classified: Secondary | ICD-10-CM

## 2016-04-20 DIAGNOSIS — I119 Hypertensive heart disease without heart failure: Secondary | ICD-10-CM

## 2016-04-20 DIAGNOSIS — E042 Nontoxic multinodular goiter: Secondary | ICD-10-CM | POA: Diagnosis not present

## 2016-04-20 DIAGNOSIS — Z7982 Long term (current) use of aspirin: Secondary | ICD-10-CM

## 2016-04-20 DIAGNOSIS — I10 Essential (primary) hypertension: Secondary | ICD-10-CM

## 2016-04-20 DIAGNOSIS — D696 Thrombocytopenia, unspecified: Secondary | ICD-10-CM | POA: Diagnosis not present

## 2016-04-20 DIAGNOSIS — I517 Cardiomegaly: Secondary | ICD-10-CM

## 2016-04-20 DIAGNOSIS — C160 Malignant neoplasm of cardia: Secondary | ICD-10-CM

## 2016-04-20 DIAGNOSIS — M129 Arthropathy, unspecified: Secondary | ICD-10-CM

## 2016-04-20 DIAGNOSIS — E119 Type 2 diabetes mellitus without complications: Secondary | ICD-10-CM

## 2016-04-20 DIAGNOSIS — N200 Calculus of kidney: Secondary | ICD-10-CM

## 2016-04-20 DIAGNOSIS — Z85828 Personal history of other malignant neoplasm of skin: Secondary | ICD-10-CM

## 2016-04-20 DIAGNOSIS — D649 Anemia, unspecified: Secondary | ICD-10-CM

## 2016-04-20 DIAGNOSIS — M818 Other osteoporosis without current pathological fracture: Secondary | ICD-10-CM

## 2016-04-20 DIAGNOSIS — C3431 Malignant neoplasm of lower lobe, right bronchus or lung: Secondary | ICD-10-CM

## 2016-04-20 DIAGNOSIS — Z79899 Other long term (current) drug therapy: Secondary | ICD-10-CM

## 2016-04-20 DIAGNOSIS — Z7984 Long term (current) use of oral hypoglycemic drugs: Secondary | ICD-10-CM

## 2016-04-20 DIAGNOSIS — R011 Cardiac murmur, unspecified: Secondary | ICD-10-CM

## 2016-04-20 DIAGNOSIS — Z902 Acquired absence of lung [part of]: Secondary | ICD-10-CM

## 2016-04-20 NOTE — Progress Notes (Signed)
[place] NOTE  Patient Care Team: Adin Hector, MD as PCP - General (Internal Medicine)  CHIEF COMPLAINTS/PURPOSE OF CONSULTATION:   Oncology History   # FEB 2017- ADENOCARCINOMA with Lepidic 80%-20% acinar pattern; pT2a [Stage IB;T-2.3cm; visceral pleural invasion present; pN=0 ]; AUG 2017- CT NED  # DEC 2017- [place] ca; signet ring [1.5 x3 mm gastric incisura; Dr.Skulskie]; EUS [Dr.Burnbridge]; no significant abnormality noted;  Reviewed at [facility] also. JAN 2018- PET NED.      Primary malignant neoplasm of right lower lobe of lung ([place])   12/02/2015 Initial Diagnosis    Primary malignant neoplasm of right lower lobe of lung (HCC)      Adenocarcinoma of gastric cardia ([place])   04/10/2016 Initial Diagnosis    Adenocarcinoma of gastric cardia (HCC)       HISTORY OF PRESENTING ILLNESS:  Stacy Cook 81 y.o.  female patient Stage I lung cancer adenocarcinoma; Prior history of stomach carcinoid- is here to follow-up regarding a new diagnosis of "gastric cancer"; and also to review the results of the PET scan.  Denies any difficulty swallowing. Denies any nausea vomiting. No weight loss. She denies any fevers or chills. Appetite is improving. No cough or shortness of breath or chest pain.  ROS: A complete 10 point review of system is done which is negative except mentioned above in history of present illness  MEDICAL HISTORY:  Past Medical History:  Diagnosis Date  . Anemia   . Arthritis    back  . Basal cell carcinoma of skin 2016  . Cancer of right lung ([place]) 05/09/2015   Dr. Genevive Bi performed Right lower lobe lobectomy.   . Cataract   . Diabetes mellitus without complication ([place])   . Hypertension   . Murmur   . Osteoporosis   . Stomach tumor (benign)     SURGICAL HISTORY: Past Surgical History:  Procedure Laterality Date  . ABDOMINAL HYSTERECTOMY    . APPENDECTOMY    . ESOPHAGOGASTRODUODENOSCOPY (EGD) WITH PROPOFOL N/A 03/27/2016   Procedure:  ESOPHAGOGASTRODUODENOSCOPY (EGD) WITH PROPOFOL;  Surgeon: Lollie Sails, MD;  Location: [facility] ENDOSCOPY;  Service: Endoscopy;  Laterality: N/A;  . SHOULDER ARTHROSCOPY W/ CAPSULAR REPAIR Right   . THORACOTOMY Right 05/09/2015   Procedure: THORACOTOMY, RIGHT LOWER LOBECTOMY, BRONCHOSCOPY;  Surgeon: Nestor Lewandowsky, MD;  Location: [facility] ORS;  Service: Thoracic;  Laterality: Right;    SOCIAL HISTORY: Social History   Social History  . Marital status: Widowed    Spouse name: N/A  . Number of children: N/A  . Years of education: N/A   Occupational History  . Not on file.   Social History Main Topics  . Smoking status: Never Smoker  . Smokeless tobacco: Never Used  . Alcohol use No  . Drug use: No  . Sexual activity: Not on file   Other Topics Concern  . Not on file   Social History Narrative  . No narrative on file    FAMILY HISTORY: Family History  Problem Relation Age of Onset  . Diabetes Other   . Breast cancer Neg Hx     ALLERGIES:  is allergic to statins; sulfa antibiotics; and pneumococcal vaccine.  MEDICATIONS:  Current Outpatient Prescriptions  Medication Sig Dispense Refill  . aspirin 81 MG tablet Take 81 mg by mouth daily. Reported on 06/03/2015    . cholecalciferol (VITAMIN D) 1000 UNITS tablet Take 1,000 Units by mouth daily.    . cyanocobalamin (V-R VITAMIN B-12) 500 MCG  tablet Take by mouth.    Marland Kitchen HYDROcodone-acetaminophen (NORCO) 7.5-325 MG tablet Take 1-2 tablets by mouth every 4 (four) hours as needed for moderate pain or severe pain. 60 tablet 0  . losartan (COZAAR) 100 MG tablet Take 100 mg by mouth.    . metFORMIN (GLUCOPHAGE) 500 MG tablet Take 500 mg by mouth 2 (two) times daily with a meal.    . metoprolol succinate (TOPROL-XL) 25 MG 24 hr tablet Take 25 mg by mouth daily.     . Omega-3 Fatty Acids (FISH OIL) 1000 MG CAPS Take by mouth.    . Probiotic Product (PROBIOTIC DAILY PO) Take by mouth.    . RABEprazole (ACIPHEX) 20 MG tablet Take 20 mg by  mouth 2 (two) times daily.     Marland Kitchen albuterol (PROVENTIL) (2.5 MG/3ML) 0.083% nebulizer solution Take 3 mLs (2.5 mg total) by nebulization every 6 (six) hours as needed for wheezing or shortness of breath. (Patient not taking: Reported on 04/20/2016) 75 mL 12   No current facility-administered medications for this visit.       Marland Kitchen  PHYSICAL EXAMINATION: ECOG PERFORMANCE STATUS: 0 - Asymptomatic  Vitals:   04/20/16 0830  BP: (!) 167/61  Pulse: (!) 56  Temp: (!) 96.6 F (35.9 C)   Filed Weights   04/20/16 0830  Weight: 125 lb (56.7 kg)    GENERAL: Well-nourished well-developed; Alert, no distress and comfortable.   Alone EYES: no pallor or icterus OROPHARYNX: no thrush or ulceration; good dentition  NECK: supple, no masses felt LYMPH:  no palpable lymphadenopathy in the cervical, axillary or inguinal regions LUNGS: clear to auscultation and  No wheeze or crackles HEART/CVS: regular rate & rhythm and no murmurs; No lower extremity edema ABDOMEN: abdomen soft, non-tender and normal bowel sounds Musculoskeletal:no cyanosis of digits and no clubbing  PSYCH: alert & oriented x 3 with fluent speech NEURO: no focal motor/sensory deficits     LABORATORY DATA:  I have reviewed the data as listed Lab Results  Component Value Date   WBC 5.3 04/10/2016   HGB 11.3 (L) 04/10/2016   HCT 33.7 (L) 04/10/2016   MCV 87.5 04/10/2016   PLT 106 (L) 04/10/2016    Recent Labs  05/03/15 0851  05/11/15 0542 11/30/15 0955 12/02/15 1001 04/10/16 1411  NA 140  < > 140  --  139 141  K 4.2  < > 3.7  --  4.8 4.3  CL 108  < > 109  --  106 104  CO2 25  < > 27  --  28 29  GLUCOSE 92  < > 117*  --  101* 88  BUN 19  < > 15  --  24* 16  CREATININE 0.85  < > 0.83 1.10* 0.99 1.06*  CALCIUM 9.6  < > 7.7*  --  8.8* 9.2  GFRNONAA >60  < > >60  --  54* 49*  GFRAA >60  < > >60  --  >60 57*  PROT 7.5  --   --   --  7.0 7.2  ALBUMIN 4.3  --   --   --  4.2 4.3  AST 21  --   --   --  24 21  ALT 14  --    --   --  21 11*  ALKPHOS 71  --   --   --  64 68  BILITOT 0.8  --   --   --  0.6 0.8  < > =  values in this interval not displayed.  RADIOGRAPHIC STUDIES: I have personally reviewed the radiological images as listed and agreed with the findings in the report. Nm Pet Image Initial (pi) Skull Base To Thigh  Result Date: 04/17/2016 CLINICAL DATA:  Initial treatment strategy for adenocarcinoma of the gastric cardia. Primary malignant neoplasm of the right lower lobe. EXAM: NUCLEAR MEDICINE PET SKULL BASE TO THIGH TECHNIQUE: 13.0 mCi F-18 FDG was injected intravenously. Full-ring PET imaging was performed from the skull base to thigh after the radiotracer. CT data was obtained and used for attenuation correction and anatomic localization. FASTING BLOOD GLUCOSE:  Value: 105 mg/dl COMPARISON:  11/30/2015 CT scan and 11/25/2014 PET-CT FINDINGS: NECK No hypermetabolic lymph nodes in the neck. Carotid atherosclerotic calcification noted. Several small hypodense thyroid nodules are not hypermetabolic. CHEST No hypermetabolic mediastinal or hilar nodes. No suspicious pulmonary nodules on the CT scan. Coronary, aortic arch, and branch vessel atherosclerotic vascular disease. Aortic and mitral valve calcification with mild cardiomegaly. Small right pleural effusion. Prior right lower lobectomy. ABDOMEN/PELVIS No significant abnormal hypermetabolic activity in the gastric cardia or elsewhere in the stomach, although there is some low-grade diffuse physiologic gastric activity. No abnormal activity in the liver, spleen, pancreas, adrenal glands, or kidneys. Left kidney lower pole nephrolithiasis. Aortoiliac atherosclerotic vascular disease. Physiologic activity in the bowel. SKELETON No focal hypermetabolic activity to suggest skeletal metastasis. Sclerotic lesion in the left inferior pubic ramus is unchanged from 2013 and likely a bone island. Similar sclerotic lesion in the left iliac bone above the acetabulum. Similar  sclerotic lesion in the left sacrum. All are stable from 2013. IMPRESSION: 1. No specific hypermetabolic activity to correlate with the known gastric cardia mass. 2. Prior right lower lobectomy, with no findings of recurrent malignancy in the chest. Small right pleural effusion. 3. Extensive atherosclerosis.  Mild cardiomegaly. 4. Left kidney lower pole nephrolithiasis. 5. Bone islands in the pelvis. Electronically Signed   By: Van Clines M.D.   On: 04/17/2016 10:26   Mm Screening Breast Tomo Bilateral  Result Date: 04/18/2016 CLINICAL DATA:  Screening. EXAM: 2D DIGITAL SCREENING BILATERAL MAMMOGRAM WITH CAD AND ADJUNCT TOMO COMPARISON:  Previous exam(s). ACR Breast Density Category b: There are scattered areas of fibroglandular density. FINDINGS: In the left breast, possible distortion warrants further evaluation. In the right breast, no findings suspicious for malignancy. Images were processed with CAD. IMPRESSION: Further evaluation is suggested for possible distortion in the left breast. RECOMMENDATION: Diagnostic mammogram and possibly ultrasound of the left breast. (Code:[id number]) The patient will be contacted regarding the findings, and additional imaging will be scheduled. BI-RADS CATEGORY  0: Incomplete. Need additional imaging evaluation and/or prior mammograms for comparison. Electronically Signed   By: Ammie Ferrier M.D.   On: 04/18/2016 16:02    ASSESSMENT & PLAN:  Adenocarcinoma of gastric cardia ([place]) # Signet ring adenocarcinoma- gastric incisura; STAGE I;PET-NED.  Discussed with Dr.Burnbridge- not seen on EUS. Recommend surveillance endoscopy every 3-6 months/ at discretion of Dr.Skulskie.  #  Right lung adenocarcinoma stage IB [ positive for  Visceral pleural invasion/ size 2.3 cm]  Status post lobectomy. No adjuvant chemo. NED.    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 90-130s. Asymptomatic follow up.   # follow up in 6 months/labs; scan prior.   # # I reviewed  the blood work- with the patient in detail; also reviewed the imaging independently [as summarized above]; and with the patient in detail.    Cammie Sickle, MD 04/20/2016 3:02 PM

## 2016-04-20 NOTE — Assessment & Plan Note (Addendum)
#   Signet ring adenocarcinoma- gastric incisura; STAGE I;PET-NED.  Discussed with Dr.Burnbridge- not seen on EUS. Recommend surveillance endoscopy every 3-6 months/ at discretion of Dr.Skulskie.  #  Right lung adenocarcinoma stage IB [ positive for  Visceral pleural invasion/ size 2.3 cm]  Status post lobectomy. No adjuvant chemo. NED.    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 90-130s. Asymptomatic follow up.   # follow up in 6 months/labs; scan prior.   # # I reviewed the blood work- with the patient in detail; also reviewed the imaging independently [as summarized above]; and with the patient in detail.

## 2016-04-20 NOTE — Progress Notes (Signed)
Patient here today for follow up.   

## 2016-04-30 ENCOUNTER — Ambulatory Visit
Admission: RE | Admit: 2016-04-30 | Discharge: 2016-04-30 | Disposition: A | Discharge status: Home / Self Care | Payer: Medicare Other | Source: Ambulatory Visit | Attending: Internal Medicine | Admitting: Internal Medicine

## 2016-04-30 ENCOUNTER — Other Ambulatory Visit: Payer: Self-pay | Admitting: Internal Medicine

## 2016-04-30 DIAGNOSIS — R928 Other abnormal and inconclusive findings on diagnostic imaging of breast: Secondary | ICD-10-CM

## 2016-05-09 ENCOUNTER — Ambulatory Visit
Admission: RE | Admit: 2016-05-09 | Discharge: 2016-05-09 | Disposition: A | Discharge status: Home / Self Care | Payer: Medicare Other | Source: Ambulatory Visit | Attending: Internal Medicine | Admitting: Internal Medicine

## 2016-05-09 DIAGNOSIS — R928 Other abnormal and inconclusive findings on diagnostic imaging of breast: Secondary | ICD-10-CM

## 2016-05-25 ENCOUNTER — Encounter: Payer: Self-pay | Admitting: *Deleted

## 2016-05-28 ENCOUNTER — Ambulatory Visit
Admission: RE | Admit: 2016-05-28 | Discharge: 2016-05-28 | Disposition: A | Discharge status: Home / Self Care | Payer: Medicare Other | Source: Ambulatory Visit | Attending: Gastroenterology | Admitting: Gastroenterology

## 2016-05-28 ENCOUNTER — Encounter
Admission: RE | Disposition: A | Discharge status: Home / Self Care | Payer: Self-pay | Source: Ambulatory Visit | Attending: Gastroenterology

## 2016-05-28 ENCOUNTER — Ambulatory Visit: Payer: Medicare Other | Admitting: Anesthesiology

## 2016-05-28 ENCOUNTER — Encounter: Payer: Self-pay | Admitting: *Deleted

## 2016-05-28 DIAGNOSIS — K449 Diaphragmatic hernia without obstruction or gangrene: Secondary | ICD-10-CM | POA: Insufficient documentation

## 2016-05-28 DIAGNOSIS — Z881 Allergy status to other antibiotic agents status: Secondary | ICD-10-CM | POA: Insufficient documentation

## 2016-05-28 DIAGNOSIS — K2970 Gastritis, unspecified, without bleeding: Secondary | ICD-10-CM | POA: Diagnosis present

## 2016-05-28 DIAGNOSIS — Z7982 Long term (current) use of aspirin: Secondary | ICD-10-CM | POA: Insufficient documentation

## 2016-05-28 DIAGNOSIS — M810 Age-related osteoporosis without current pathological fracture: Secondary | ICD-10-CM | POA: Diagnosis not present

## 2016-05-28 DIAGNOSIS — E119 Type 2 diabetes mellitus without complications: Secondary | ICD-10-CM | POA: Diagnosis not present

## 2016-05-28 DIAGNOSIS — Z887 Allergy status to serum and vaccine status: Secondary | ICD-10-CM | POA: Diagnosis not present

## 2016-05-28 DIAGNOSIS — Z85828 Personal history of other malignant neoplasm of skin: Secondary | ICD-10-CM | POA: Insufficient documentation

## 2016-05-28 DIAGNOSIS — Z7951 Long term (current) use of inhaled steroids: Secondary | ICD-10-CM | POA: Diagnosis not present

## 2016-05-28 DIAGNOSIS — K219 Gastro-esophageal reflux disease without esophagitis: Secondary | ICD-10-CM | POA: Insufficient documentation

## 2016-05-28 DIAGNOSIS — I10 Essential (primary) hypertension: Secondary | ICD-10-CM | POA: Insufficient documentation

## 2016-05-28 DIAGNOSIS — K2960 Other gastritis without bleeding: Secondary | ICD-10-CM | POA: Insufficient documentation

## 2016-05-28 DIAGNOSIS — Z79899 Other long term (current) drug therapy: Secondary | ICD-10-CM | POA: Insufficient documentation

## 2016-05-28 DIAGNOSIS — Z85118 Personal history of other malignant neoplasm of bronchus and lung: Secondary | ICD-10-CM | POA: Diagnosis not present

## 2016-05-28 DIAGNOSIS — Z7984 Long term (current) use of oral hypoglycemic drugs: Secondary | ICD-10-CM | POA: Diagnosis not present

## 2016-05-28 HISTORY — PX: ESOPHAGOGASTRODUODENOSCOPY (EGD) WITH PROPOFOL: SHX5813

## 2016-05-28 LAB — GLUCOSE, CAPILLARY: Glucose-Capillary: 89 mg/dL (ref 65–99)

## 2016-05-28 SURGERY — ESOPHAGOGASTRODUODENOSCOPY (EGD) WITH PROPOFOL
Anesthesia: General

## 2016-05-28 MED ORDER — SODIUM CHLORIDE 0.9 % IV SOLN: INTRAVENOUS | Status: DC

## 2016-05-28 MED ORDER — SODIUM CHLORIDE 0.9 % IV SOLN
INTRAVENOUS | Status: DC
  Administered 2016-05-28: 1000 mL via INTRAVENOUS

## 2016-05-28 MED ORDER — LIDOCAINE HCL (PF) 2 % IJ SOLN
INTRAMUSCULAR | Status: DC | PRN
  Administered 2016-05-28: 50 mg via INTRADERMAL

## 2016-05-28 MED ORDER — SPOT INK MARKER SYRINGE KIT
PACK | SUBMUCOSAL | Status: DC | PRN
  Administered 2016-05-28: 1 mL via SUBMUCOSAL

## 2016-05-28 MED ORDER — PROPOFOL 500 MG/50ML IV EMUL
INTRAVENOUS | Status: DC | PRN
  Administered 2016-05-28: 150 ug/kg/min via INTRAVENOUS

## 2016-05-28 MED ORDER — PROPOFOL 10 MG/ML IV BOLUS
INTRAVENOUS | Status: DC | PRN
  Administered 2016-05-28 (×2): 20 mg via INTRAVENOUS
  Administered 2016-05-28: 60 mg via INTRAVENOUS

## 2016-05-28 MED ORDER — SODIUM CHLORIDE 0.9 % IV SOLN
INTRAVENOUS | Status: DC | PRN
  Administered 2016-05-28: 11:00:00 via INTRAVENOUS

## 2016-05-28 NOTE — Op Note (Signed)
[facility] Gastroenterology Patient Name: Stacy Cook Procedure Date: 05/28/2016 11:29 AM MRN: [id number] Account #: [id number] Date of Birth: 05-26-1935 Admit Type: Outpatient Age: 81 Room: [facility] ENDO ROOM 3 Gender: Female Note Status: Finalized Procedure:            Upper GI endoscopy Indications:          Gastric tumor of uncertain behavior, Follow-up of                        gastric tumor of uncertain behavior Providers:            Lollie Sails, MD Referring MD:         Ramonita Lab, MD (Referring MD) Medicines:            Monitored Anesthesia Care Complications:        No immediate complications. Procedure:            Pre-Anesthesia Assessment:                       - ASA Grade Assessment: III - A patient with severe                        systemic disease.                       After obtaining informed consent, the endoscope was                        passed under direct vision. Throughout the procedure,                        the patient's blood pressure, pulse, and oxygen                        saturations were monitored continuously. The Endoscope                        was introduced through the mouth, and advanced to the                        third part of duodenum. The patient tolerated the                        procedure well. Findings:      The examined esophagus was normal.      A small hiatal hernia was present.      Patchy minimal inflammation characterized by erythema was found in the       gastric body and in the gastric antrum. The antrum has the patchy       appearance of possible intestinal metaplasia or atrophic mucosa. On the       anterior end of the incisura, a small mucosal irregularity is noted,       adjacent to a small clot. This site was biopsied/ removed with several       passes of a biopsy forcep. Hemostasis was good. A small amount,       approximately a cc of tatoo was placed about 8-10 mm more anterior from       the  lesion for marking purposes.      Biopsies were taken with a cold forceps on the  anterior wall of the       gastric body, on the greater curvature of the gastric body and on the       lesser curvature of the gastric antrum for histology.      There were no other lesions noted.      The cardia and gastric fundus were normal on retroflexion.      The examined duodenum was normal. Impression:           - Normal esophagus.                       - Small hiatal hernia.                       - Gastritis.                       - Normal examined duodenum.                       - Biopsies were taken with a cold forceps for histology                        on the anterior wall of the gastric body, on the                        greater curvature of the gastric body and on the lesser                        curvature of the gastric antrum. Recommendation:       - Discharge patient to home.                       - Continue present medications.                       - Await pathology results. Procedure Code(s):    --- Professional ---                       [phone], Esophagogastroduodenoscopy, flexible, transoral;                        with biopsy, single or multiple Diagnosis Code(s):    --- Professional ---                       K44.9, Diaphragmatic hernia without obstruction or                        gangrene                       K29.70, Gastritis, unspecified, without bleeding                       D37.1, Neoplasm of uncertain behavior of stomach CPT copyright 2016 American Medical Association. All rights reserved. The codes documented in this report are preliminary and upon coder review may  be revised to meet current compliance requirements. Lollie Sails, MD 05/28/2016 12:19:50 PM This report has been signed electronically. Number of Addenda: 0 Note Initiated On: 05/28/2016 11:29 AM      [facility]

## 2016-05-28 NOTE — Anesthesia Postprocedure Evaluation (Signed)
Anesthesia Post Note  Patient: Stacy Cook  Procedure(s) Performed: Procedure(s) (LRB): ESOPHAGOGASTRODUODENOSCOPY (EGD) WITH PROPOFOL (N/A)  Patient location during evaluation: Endoscopy Anesthesia Type: General Level of consciousness: awake and alert and oriented Pain management: pain level controlled Vital Signs Assessment: post-procedure vital signs reviewed and stable Respiratory status: spontaneous breathing, nonlabored ventilation and respiratory function stable Cardiovascular status: blood pressure returned to baseline and stable Postop Assessment: no signs of nausea or vomiting Anesthetic complications: no     Last Vitals:  Vitals:   05/28/16 1210 05/28/16 1220  BP: (!) 112/40 (!) 133/45  Pulse: 63 (!) 56  Resp: 16 14  Temp: (!) 35.6 C     Last Pain:  Vitals:   05/28/16 1210  TempSrc: Tympanic                 Seraphim Trow

## 2016-05-28 NOTE — Anesthesia Preprocedure Evaluation (Signed)
Anesthesia Evaluation  Patient identified by MRN, date of birth, ID band Patient awake    Reviewed: Allergy & Precautions, NPO status , Patient's Chart, lab work & pertinent test results  History of Anesthesia Complications Negative for: history of anesthetic complications  Airway Mallampati: III  TM Distance: >3 FB Neck ROM: Full    Dental no notable dental hx.    Pulmonary neg sleep apnea, neg COPD,    breath sounds clear to auscultation- rhonchi (-) wheezing      Cardiovascular hypertension, Pt. on medications + CAD (nonocclusive)  (-) Past MI and (-) Cardiac Stents  Rhythm:Regular Rate:Normal - Systolic murmurs and - Diastolic murmurs NM stress test 04/15/15: No evidence of ischemia  Echo 04/15/15: NORMAL LEFT VENTRICULAR SYSTOLIC FUNCTION WITH AN ESTIMATED EF = >55 % NORMAL RIGHT VENTRICULAR SYSTOLIC FUNCTION MILD TRICUSPID, MITRAL AND AORTIC VALVE INSUFFICIENCY MODERATE AORTIC VALVE STENOSIS WITH A CALCULATED AVA = 0.82 cm^2 BY CONTINUITY EQUATION MODERATE LVH   Neuro/Psych negative neurological ROS  negative psych ROS   GI/Hepatic Neg liver ROS, GERD  ,  Endo/Other  diabetes, Oral Hypoglycemic Agents  Renal/GU negative Renal ROS     Musculoskeletal  (+) Arthritis ,   Abdominal (+) - obese,   Peds  Hematology  (+) anemia ,   Anesthesia Other Findings Past Medical History: No date: Anemia No date: Arthritis     Comment: back 2016: Basal cell carcinoma of skin 05/09/2015: Cancer of right lung (HCC)     Comment: Dr. Genevive Bi performed Right lower lobe lobectomy. No date: Cataract No date: Diabetes mellitus without complication (HCC) No date: Hypertension No date: Murmur No date: Osteoporosis No date: Stomach tumor (benign)   Reproductive/Obstetrics                            Anesthesia Physical Anesthesia Plan  ASA: III  Anesthesia Plan: General   Post-op Pain Management:     Induction: Intravenous  Airway Management Planned: Natural Airway  Additional Equipment:   Intra-op Plan:   Post-operative Plan:   Informed Consent: I have reviewed the patients History and Physical, chart, labs and discussed the procedure including the risks, benefits and alternatives for the proposed anesthesia with the patient or authorized representative who has indicated his/her understanding and acceptance.   Dental advisory given  Plan Discussed with: CRNA and Anesthesiologist  Anesthesia Plan Comments:         Anesthesia Quick Evaluation

## 2016-05-28 NOTE — Transfer of Care (Signed)
Immediate Anesthesia Transfer of Care Note  Patient: Stacy Cook  Procedure(s) Performed: Procedure(s): ESOPHAGOGASTRODUODENOSCOPY (EGD) WITH PROPOFOL (N/A)  Patient Location: Endoscopy Unit  Anesthesia Type:General  Level of Consciousness: sedated  Airway & Oxygen Therapy: Patient Spontanous Breathing and Patient connected to nasal cannula oxygen  Post-op Assessment: Report given to RN and Post -op Vital signs reviewed and stable  Post vital signs: Reviewed and stable  Last Vitals:  Vitals:   05/28/16 1025  BP: (!) 185/59  Pulse: (!) 58  Resp: 19  Temp: (!) 35.9 C    Last Pain:  Vitals:   05/28/16 1025  TempSrc: Tympanic         Complications: No apparent anesthesia complications

## 2016-05-28 NOTE — H&P (Signed)
Outpatient short stay form Pre-procedure 05/28/2016 11:30 AM Stacy Cook  Primary Physician: Dr. Ramonita Lab  Reason for visit:  EGD  History of present illness:  Patient is a 81 year old female presenting today as above. She has a history of gastric mucosal carcinoid and was undergoing a EGD on 03/27/2016. 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 Multiple evaluations in terms of endoscopic as well as CT scanning have not shown any other lesion in this regard. Also biopsies on 03/27/2016 showed no further evidence of gastric carcinoid. She is returning today for a repeat/surveillance EGD. She takes no blood thinning agents or aspirin products.    Current Facility-Administered Medications:  .  0.9 %  sodium chloride infusion, , Intravenous, Continuous, Stacy Sails, Cook, Last Rate: 20 mL/hr at 05/28/16 1101, 1,000 mL at 05/28/16 1101 .  0.9 %  sodium chloride infusion, , Intravenous, Continuous, Stacy Sails, Cook  Facility-Administered Medications Ordered in Other Encounters:  .  0.9 %  sodium chloride infusion, , Intravenous, Continuous PRN, Amy [place]ward[place], Cook  Prescriptions Prior to Admission  Medication Sig Dispense Refill Last Dose  . aspirin 81 MG tablet Take 81 mg by mouth daily. Reported on 06/03/2015   Past Week at Unknown time  . cholecalciferol (VITAMIN D) 1000 UNITS tablet Take 1,000 Units by mouth daily.   Past Week at Unknown time  . cyanocobalamin (V-R VITAMIN B-12) 500 MCG tablet Take by mouth.   Past Week at Unknown time  . HYDROcodone-acetaminophen (NORCO) 7.5-325 MG tablet Take 1-2 tablets by mouth every 4 (four) hours as needed for moderate pain or severe pain. 60 tablet 0 Past Week at Unknown time  . losartan (COZAAR) 100 MG tablet Take 100 mg by mouth.   05/27/2016 at Unknown time  . metFORMIN (GLUCOPHAGE) 500 MG tablet Take 500 mg by  mouth 2 (two) times daily with a meal.   Past Week at Unknown time  . metoprolol succinate (TOPROL-XL) 25 MG 24 hr tablet Take 25 mg by mouth daily.    05/27/2016 at 1830  . Omega-3 Fatty Acids (FISH OIL) 1000 MG CAPS Take by mouth.   Past Week at Unknown time  . albuterol (PROVENTIL) (2.5 MG/3ML) 0.083% nebulizer solution Take 3 mLs (2.5 mg total) by nebulization every 6 (six) hours as needed for wheezing or shortness of breath. (Patient not taking: Reported on 04/20/2016) 75 mL 12 Not Taking at Unknown time  . Probiotic Product (PROBIOTIC DAILY PO) Take by mouth.   Not Taking at Unknown time  . RABEprazole (ACIPHEX) 20 MG tablet Take 20 mg by mouth 2 (two) times daily.    Not Taking at Unknown time     Allergies  Allergen Reactions  . Statins     Joint pain  . Sulfa Antibiotics Nausea And Vomiting  . Pneumococcal Vaccine Itching    Hives and fever     Past Medical History:  Diagnosis Date  . Anemia   . Arthritis    back  . Basal cell carcinoma of skin 2016  . Cancer of right lung ([place]) 05/09/2015   Dr. Genevive Bi performed Right lower lobe lobectomy.   . Cataract   . Diabetes mellitus without complication ([place])   . Hypertension   . Murmur   . Osteoporosis   . Stomach tumor (benign)     Review of systems:  Physical Exam    Heart and lungs: Regular rate and rhythm without rub or gallop, lungs are bilaterally clear.    HEENT: Normocephalic atraumatic eyes are anicteric    Other:     Pertinant exam for procedure: Soft nontender nondistended bowel sounds positive normoactive.    Planned proceedures: EGD and indicated procedures. I have discussed the risks benefits and complications of procedures to include not limited to bleeding, infection, perforation and the risk of sedation and the patient wishes to proceed.    Stacy Sails, [facility] 05/28/2016  11:30 AM

## 2016-05-28 NOTE — Anesthesia Post-op Follow-up Note (Signed)
Anesthesia QCDR form completed.        

## 2016-05-29 ENCOUNTER — Encounter: Payer: Self-pay | Admitting: Gastroenterology

## 2016-05-30 LAB — SURGICAL PATHOLOGY

## 2016-06-01 ENCOUNTER — Ambulatory Visit
Admission: RE | Admit: 2016-06-01 | Discharge: 2016-06-01 | Disposition: A | Discharge status: Home / Self Care | Payer: Medicare Other | Source: Ambulatory Visit | Attending: Internal Medicine | Admitting: Internal Medicine

## 2016-06-01 DIAGNOSIS — R918 Other nonspecific abnormal finding of lung field: Secondary | ICD-10-CM | POA: Insufficient documentation

## 2016-06-01 DIAGNOSIS — Z902 Acquired absence of lung [part of]: Secondary | ICD-10-CM | POA: Diagnosis not present

## 2016-06-01 DIAGNOSIS — C3431 Malignant neoplasm of lower lobe, right bronchus or lung: Secondary | ICD-10-CM | POA: Insufficient documentation

## 2016-06-01 MED ORDER — IOPAMIDOL (ISOVUE-300) INJECTION 61%
75.0000 mL | Freq: Once | INTRAVENOUS | Status: AC | PRN
  Administered 2016-06-01: 75 mL via INTRAVENOUS

## 2016-06-04 ENCOUNTER — Ambulatory Visit: Payer: Medicare Other | Admitting: Internal Medicine

## 2016-06-04 ENCOUNTER — Other Ambulatory Visit: Payer: Medicare Other

## 2016-06-08 ENCOUNTER — Inpatient Hospital Stay (HOSPITAL_BASED_OUTPATIENT_CLINIC_OR_DEPARTMENT_OTHER): Payer: Medicare Other | Admitting: Internal Medicine

## 2016-06-08 ENCOUNTER — Inpatient Hospital Stay: Payer: Medicare Other | Attending: Internal Medicine

## 2016-06-08 VITALS — BP 189/70 | HR 64 | Temp 97.8°F | Resp 18 | Ht 62.0 in | Wt 127.2 lb

## 2016-06-08 DIAGNOSIS — D649 Anemia, unspecified: Secondary | ICD-10-CM | POA: Diagnosis not present

## 2016-06-08 DIAGNOSIS — Z79899 Other long term (current) drug therapy: Secondary | ICD-10-CM | POA: Insufficient documentation

## 2016-06-08 DIAGNOSIS — Z7982 Long term (current) use of aspirin: Secondary | ICD-10-CM | POA: Diagnosis not present

## 2016-06-08 DIAGNOSIS — Z85828 Personal history of other malignant neoplasm of skin: Secondary | ICD-10-CM | POA: Diagnosis not present

## 2016-06-08 DIAGNOSIS — M818 Other osteoporosis without current pathological fracture: Secondary | ICD-10-CM | POA: Insufficient documentation

## 2016-06-08 DIAGNOSIS — I700 Atherosclerosis of aorta: Secondary | ICD-10-CM | POA: Insufficient documentation

## 2016-06-08 DIAGNOSIS — E042 Nontoxic multinodular goiter: Secondary | ICD-10-CM

## 2016-06-08 DIAGNOSIS — D696 Thrombocytopenia, unspecified: Secondary | ICD-10-CM | POA: Insufficient documentation

## 2016-06-08 DIAGNOSIS — Z85118 Personal history of other malignant neoplasm of bronchus and lung: Secondary | ICD-10-CM | POA: Diagnosis not present

## 2016-06-08 DIAGNOSIS — C160 Malignant neoplasm of cardia: Secondary | ICD-10-CM

## 2016-06-08 DIAGNOSIS — I517 Cardiomegaly: Secondary | ICD-10-CM | POA: Diagnosis not present

## 2016-06-08 DIAGNOSIS — M129 Arthropathy, unspecified: Secondary | ICD-10-CM | POA: Diagnosis not present

## 2016-06-08 DIAGNOSIS — Z7984 Long term (current) use of oral hypoglycemic drugs: Secondary | ICD-10-CM | POA: Insufficient documentation

## 2016-06-08 DIAGNOSIS — Z85028 Personal history of other malignant neoplasm of stomach: Secondary | ICD-10-CM | POA: Diagnosis not present

## 2016-06-08 DIAGNOSIS — Z9221 Personal history of antineoplastic chemotherapy: Secondary | ICD-10-CM

## 2016-06-08 DIAGNOSIS — E119 Type 2 diabetes mellitus without complications: Secondary | ICD-10-CM | POA: Diagnosis not present

## 2016-06-08 DIAGNOSIS — N200 Calculus of kidney: Secondary | ICD-10-CM | POA: Insufficient documentation

## 2016-06-08 DIAGNOSIS — I10 Essential (primary) hypertension: Secondary | ICD-10-CM

## 2016-06-08 DIAGNOSIS — R011 Cardiac murmur, unspecified: Secondary | ICD-10-CM | POA: Diagnosis not present

## 2016-06-08 DIAGNOSIS — C169 Malignant neoplasm of stomach, unspecified: Secondary | ICD-10-CM | POA: Diagnosis not present

## 2016-06-08 LAB — CBC WITH DIFFERENTIAL/PLATELET
BASOS ABS: 0 10*3/uL (ref 0–0.1)
Basophils Relative: 1 %
EOS ABS: 0.1 10*3/uL (ref 0–0.7)
EOS PCT: 1 %
HCT: 33.8 % — ABNORMAL LOW (ref 35.0–47.0)
Hemoglobin: 11.8 g/dL — ABNORMAL LOW (ref 12.0–16.0)
LYMPHS PCT: 20 %
Lymphs Abs: 1.1 10*3/uL (ref 1.0–3.6)
MCH: 30 pg (ref 26.0–34.0)
MCHC: 35 g/dL (ref 32.0–36.0)
MCV: 85.7 fL (ref 80.0–100.0)
MONO ABS: 0.3 10*3/uL (ref 0.2–0.9)
Monocytes Relative: 6 %
Neutro Abs: 4.1 10*3/uL (ref 1.4–6.5)
Neutrophils Relative %: 72 %
PLATELETS: 107 10*3/uL — AB (ref 150–440)
RBC: 3.95 MIL/uL (ref 3.80–5.20)
RDW: 13.3 % (ref 11.5–14.5)
WBC: 5.7 10*3/uL (ref 3.6–11.0)

## 2016-06-08 LAB — BASIC METABOLIC PANEL
ANION GAP: 8 (ref 5–15)
BUN: 14 mg/dL (ref 6–20)
CALCIUM: 9.1 mg/dL (ref 8.9–10.3)
CO2: 27 mmol/L (ref 22–32)
Chloride: 104 mmol/L (ref 101–111)
Creatinine, Ser: 0.9 mg/dL (ref 0.44–1.00)
GFR calc Af Amer: >60 mL/min (ref >60)
GFR, EST NON AFRICAN AMERICAN: 60 mL/min — AB (ref >60)
GLUCOSE: 108 mg/dL — AB (ref 65–99)
Potassium: 3.9 mmol/L (ref 3.5–5.1)
SODIUM: 139 mmol/L (ref 135–145)

## 2016-06-08 NOTE — Progress Notes (Signed)
[place] NOTE  Patient Care Team: Adin Hector, MD as PCP - General (Internal Medicine)  CHIEF COMPLAINTS/PURPOSE OF CONSULTATION:   Oncology History   # FEB 2017- ADENOCARCINOMA with Lepidic 80%-20% acinar pattern; pT2a [Stage IB;T-2.3cm; visceral pleural invasion present; pN=0 ]; AUG 2017- CT NED  # DEC 2017- [place] ca [GATA; her 2 Neu-NEG]; signet ring [1.5 x3 mm gastric incisura; Dr.Skulskie]; EUS [Dr.Burnbridge]; no significant abnormality noted;  Reviewed at [facility] also. JAN 2018- PET NED.      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  female patient Stage I lung cancer adenocarcinoma; Prior history of stomach carcinoid- is here to follow-up regarding a new diagnosis of "gastric cancer"; and also to review the results of the CT chest scan.  In the interim patient is underwent repeat surveillance endoscopy for her stage I stomach cancer and a repeat biopsy of the stomach incisura showed showed signet cell carcinoma. Denies any bleeding.  Denies any difficulty swallowing. Denies any nausea vomiting. No weight loss. She denies any fevers or chills. Appetite is improving. No cough or shortness of breath or chest pain.  ROS: A complete 10 point review of system is done which is negative except mentioned above in history of present illness  MEDICAL HISTORY:  Past Medical History:  Diagnosis Date  . Anemia   . Arthritis    back  . Basal cell carcinoma of skin 2016  . Cancer of right lung ([place]) 05/09/2015   Dr. Genevive Bi performed Right lower lobe lobectomy.   . Cataract   . Diabetes mellitus without complication ([place])   . Hypertension   . Murmur   . Osteoporosis    . Stomach tumor (benign)     SURGICAL HISTORY: Past Surgical History:  Procedure Laterality Date  . ABDOMINAL HYSTERECTOMY    . APPENDECTOMY    . BREAST BIOPSY Left 05/09/2016   path pending  . ESOPHAGOGASTRODUODENOSCOPY (EGD) WITH PROPOFOL N/A 03/27/2016   Procedure: ESOPHAGOGASTRODUODENOSCOPY (EGD) WITH PROPOFOL;  Surgeon: Lollie Sails, MD;  Location: [facility] ENDOSCOPY;  Service: Endoscopy;  Laterality: N/A;  . ESOPHAGOGASTRODUODENOSCOPY (EGD) WITH PROPOFOL N/A 05/28/2016   Procedure: ESOPHAGOGASTRODUODENOSCOPY (EGD) WITH PROPOFOL;  Surgeon: Lollie Sails, MD;  Location: [facility] ENDOSCOPY;  Service: Endoscopy;  Laterality: N/A;  . SHOULDER ARTHROSCOPY W/ CAPSULAR REPAIR Right   . THORACOTOMY Right 05/09/2015   Procedure: THORACOTOMY, RIGHT LOWER LOBECTOMY, BRONCHOSCOPY;  Surgeon: Nestor Lewandowsky, MD;  Location: [facility] ORS;  Service: Thoracic;  Laterality: Right;    SOCIAL HISTORY: Social History   Social History  . Marital status: Widowed    Spouse name: N/A  . Number of children: N/A  . Years of education: N/A   Occupational History  . Not on file.   Social History Main Topics  . Smoking status: Never Smoker  . Smokeless tobacco: Never Used  . Alcohol use No  . Drug use: No  . Sexual activity: Not on file   Other Topics Concern  . Not on file   Social History Narrative  . No narrative on file    FAMILY HISTORY: Family History  Problem Relation Age of Onset  .  Diabetes Other   . Breast cancer Neg Hx     ALLERGIES:  is allergic to statins; sulfa antibiotics; and pneumococcal vaccine.  MEDICATIONS:  Current Outpatient Prescriptions  Medication Sig Dispense Refill  . aspirin 81 MG tablet Take 81 mg by mouth daily. Reported on 06/03/2015    . cholecalciferol (VITAMIN D) 1000 UNITS tablet Take 1,000 Units by mouth daily.    . cyanocobalamin (V-R VITAMIN B-12) 500 MCG tablet Take by mouth.    Marland Kitchen HYDROcodone-acetaminophen (NORCO) 7.5-325 MG tablet Take 1-2 tablets  by mouth every 4 (four) hours as needed for moderate pain or severe pain. 60 tablet 0  . losartan (COZAAR) 100 MG tablet Take 100 mg by mouth.    . metFORMIN (GLUCOPHAGE) 500 MG tablet Take 500 mg by mouth 2 (two) times daily with a meal.    . metoprolol succinate (TOPROL-XL) 25 MG 24 hr tablet Take 25 mg by mouth daily.     . Omega-3 Fatty Acids (FISH OIL) 1000 MG CAPS Take by mouth.    . Probiotic Product (PROBIOTIC DAILY PO) Take by mouth.    . RABEprazole (ACIPHEX) 20 MG tablet Take 20 mg by mouth 2 (two) times daily.      No current facility-administered medications for this visit.       Marland Kitchen  PHYSICAL EXAMINATION: ECOG PERFORMANCE STATUS: 0 - Asymptomatic  Vitals:   06/08/16 1003  BP: (!) 189/70  Pulse: 64  Resp: 18  Temp: 97.8 F (36.6 C)   Filed Weights   06/08/16 1003  Weight: 127 lb 3.2 oz (57.7 kg)    GENERAL: Well-nourished well-developed; Alert, no distress and comfortable.   Alone EYES: no pallor or icterus OROPHARYNX: no thrush or ulceration; good dentition  NECK: supple, no masses felt LYMPH:  no palpable lymphadenopathy in the cervical, axillary or inguinal regions LUNGS: clear to auscultation and  No wheeze or crackles HEART/CVS: regular rate & rhythm; positive for murmur. No lower extremity edema ABDOMEN: abdomen soft, non-tender and normal bowel sounds Musculoskeletal:no cyanosis of digits and no clubbing  PSYCH: alert & oriented x 3 with fluent speech NEURO: no focal motor/sensory deficits     LABORATORY DATA:  I have reviewed the data as listed Lab Results  Component Value Date   WBC 5.7 06/08/2016   HGB 11.8 (L) 06/08/2016   HCT 33.8 (L) 06/08/2016   MCV 85.7 06/08/2016   PLT 107 (L) 06/08/2016    Recent Labs  12/02/15 1001 04/10/16 1411 06/08/16 0920  NA 139 141 139  K 4.8 4.3 3.9  CL 106 104 104  CO2 '28 29 27  '$ GLUCOSE 101* 88 108*  BUN 24* 16 14  CREATININE 0.99 1.06* 0.90  CALCIUM 8.8* 9.2 9.1  GFRNONAA 54* 49* 60*  GFRAA >60  57* >60  PROT 7.0 7.2  --   ALBUMIN 4.2 4.3  --   AST 24 21  --   ALT 21 11*  --   ALKPHOS 64 68  --   BILITOT 0.6 0.8  --     RADIOGRAPHIC STUDIES: I have personally reviewed the radiological images as listed and agreed with the findings in the report. Ct Chest W Contrast  Result Date: 06/01/2016 CLINICAL DATA:  Follow-up lung cancer status post right lower lobectomy in January 2017. EXAM: CT CHEST WITH CONTRAST TECHNIQUE: Multidetector CT imaging of the chest was performed during intravenous contrast administration. CONTRAST:  15m ISOVUE-300 IOPAMIDOL (ISOVUE-300) INJECTION 61% COMPARISON:  PET-CT dated 04/17/2016 FINDINGS: Cardiovascular: Cardiomegaly.  No pericardial effusion. Three vessel coronary atherosclerosis. Atherosclerotic calcifications of the aortic arch. No evidence of thoracic aortic aneurysm. Mediastinum/Nodes: Small mediastinal lymph nodes which do not meet pathologic CT size criteria. Bilateral thyroid nodules measuring up to 8 mm on the right (series 2/ image 12). Lungs/Pleura: Status post right lower lobectomy. No suspicious pulmonary nodules. Faint tree-in-bud nodularity medially in the right middle lobe (series 3/image 98), new, favored to be infectious/inflammatory. Mild biapical pleural-parenchymal scarring. No focal consolidation. Trace right pleural fluid.  No pneumothorax. Upper Abdomen: Visualized upper abdomen is notable for a tiny hiatal hernia. Musculoskeletal: Mild degenerative changes of the visualized thoracolumbar spine. Superior endplate Schmorl's node at T12. IMPRESSION: Status post right lower lobectomy. No findings specific for recurrent or metastatic disease. Faint tree-in-bud nodularity medially in the right middle lobe, new, favored to be infectious/inflammatory. Attention on follow-up is suggested. Electronically Signed   By: Julian Hy M.D.   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 Unfortunately, repeat surveillance endoscopy/ Bx- recurrent signet ring adenocarcinoma. Discussed with Dr.skulskie/ Dr. Howie Ill.   #  Right lung adenocarcinoma stage IB [ positive for  Visceral pleural invasion/ size 2.3 cm] Status post lobectomy. No adjuvant chemo. CT Chest NED.    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  Discussed re: steroids vs IVIG; Will plan based on surgical plans.   # follow up in 1 month/ cbc. I also discussed with Dr. Jamal Collin; will refer pt to Dr.Sankar.   # # I reviewed the blood work- with the patient in detail; also reviewed the imaging independently [as summarized above]; and with the patient in detail.    Cammie Sickle, MD 06/08/2016 1:08 PM

## 2016-06-08 NOTE — Assessment & Plan Note (Addendum)
#   Signet ring adenocarcinoma- gastric incisura; STAGE I;PET-NED. Unfortunately, repeat surveillance endoscopy/ Bx- recurrent signet ring adenocarcinoma. Discussed with Dr.skulskie/ Dr. Howie Ill.   #  Right lung adenocarcinoma stage IB [ positive for  Visceral pleural invasion/ size 2.3 cm] Status post lobectomy. No adjuvant chemo. CT Chest NED.    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  Discussed re: steroids vs IVIG; Will plan based on surgical plans.   # follow up in 1 month/ cbc. I also discussed with Dr. Jamal Collin; will refer pt to Dr.Sankar.   # # I reviewed the blood work- with the patient in detail; also reviewed the imaging independently [as summarized above]; and with the patient in detail.

## 2016-06-11 ENCOUNTER — Encounter: Payer: Self-pay | Admitting: General Surgery

## 2016-06-11 ENCOUNTER — Ambulatory Visit (INDEPENDENT_AMBULATORY_CARE_PROVIDER_SITE_OTHER): Payer: Medicare Other | Admitting: General Surgery

## 2016-06-11 VITALS — BP 152/78 | HR 74 | Resp 12 | Ht 64.0 in | Wt 133.0 lb

## 2016-06-11 DIAGNOSIS — C160 Malignant neoplasm of cardia: Secondary | ICD-10-CM | POA: Diagnosis not present

## 2016-06-11 DIAGNOSIS — C169 Malignant neoplasm of stomach, unspecified: Secondary | ICD-10-CM

## 2016-06-11 NOTE — Progress Notes (Signed)
Patient ID: Stacy Cook, female   DOB: Jul 13, 1935, 81 y.o.   MRN: [id number]  Chief Complaint  Patient presents with  . Other    HPI Stacy Cook is a 81 y.o. female here for evaluation for gastric cancer. She had a CT of the chest completed on 06/01/16, and an Upper endoscopy done on 05/28/16. She has a history of right, lower lobe adenocarcinoma that was treated with a right lower lobe lobectomy on 05/09/2015 by Dr. Genevive Bi. Her recent chest CT shows no malignancy. She has a history of superficial gastric neuroendocrine tumors. On a surveillance endoscopy on December, 2017 she was found to have a tiny ulcer. Biopsy showed adenocarcinoma. Subsequent EUS showed no mass/lymph nodes. Repeat biopsy done recently in the same spot and again showed adenocarcinoma. Given the small primary she was referred for consideration of surgical resection. I have reviewed the history of present illness with the patient.  HPI  Past Medical History:  Diagnosis Date  . Anemia   . Arthritis    back  . Basal cell carcinoma of skin 2016  . Cancer of right lung ([place]) 05/09/2015   Dr. Genevive Bi performed Right lower lobe lobectomy.   . Cataract   . Diabetes mellitus without complication ([place])   . Hypertension   . Murmur   . Osteoporosis   . Stomach tumor (benign)     Past Surgical History:  Procedure Laterality Date  . ABDOMINAL HYSTERECTOMY    . APPENDECTOMY    . BREAST BIOPSY Left 05/09/2016   path pending  . ESOPHAGOGASTRODUODENOSCOPY (EGD) WITH PROPOFOL N/A 03/27/2016   Procedure: ESOPHAGOGASTRODUODENOSCOPY (EGD) WITH PROPOFOL;  Surgeon: Lollie Sails, MD;  Location: [facility] ENDOSCOPY;  Service: Endoscopy;  Laterality: N/A;  . ESOPHAGOGASTRODUODENOSCOPY (EGD) WITH PROPOFOL N/A 05/28/2016   Procedure: ESOPHAGOGASTRODUODENOSCOPY (EGD) WITH PROPOFOL;  Surgeon: Lollie Sails, MD;  Location: [facility] ENDOSCOPY;  Service: Endoscopy;  Laterality: N/A;  . SHOULDER ARTHROSCOPY W/ CAPSULAR REPAIR Right   . THORACOTOMY  Right 05/09/2015   Procedure: THORACOTOMY, RIGHT LOWER LOBECTOMY, BRONCHOSCOPY;  Surgeon: Nestor Lewandowsky, MD;  Location: [facility] ORS;  Service: Thoracic;  Laterality: Right;    Family History  Problem Relation Age of Onset  . Diabetes Other   . Breast cancer Neg Hx     Social History Social History  Substance Use Topics  . Smoking status: Never Smoker  . Smokeless tobacco: Never Used  . Alcohol use No    Allergies  Allergen Reactions  . Statins     Joint pain  . Sulfa Antibiotics Nausea And Vomiting  . Pneumococcal Vaccine Itching    Hives and fever    Current Outpatient Prescriptions  Medication Sig Dispense Refill  . aspirin 81 MG tablet Take 81 mg by mouth daily. Reported on 06/03/2015    . cholecalciferol (VITAMIN D) 1000 UNITS tablet Take 1,000 Units by mouth daily.    . cyanocobalamin (V-R VITAMIN B-12) 500 MCG tablet Take by mouth.    Marland Kitchen HYDROcodone-acetaminophen (NORCO) 7.5-325 MG tablet Take 1-2 tablets by mouth every 4 (four) hours as needed for moderate pain or severe pain. 60 tablet 0  . losartan (COZAAR) 100 MG tablet Take 100 mg by mouth.    . metFORMIN (GLUCOPHAGE) 500 MG tablet Take 500 mg by mouth 2 (two) times daily with a meal.    . metoprolol succinate (TOPROL-XL) 25 MG 24 hr tablet Take 25 mg by mouth daily.     . Omega-3 Fatty Acids (FISH OIL) 1000 MG CAPS  Take by mouth.    . Probiotic Product (PROBIOTIC DAILY PO) Take by mouth.    . RABEprazole (ACIPHEX) 20 MG tablet Take 20 mg by mouth 2 (two) times daily.      No current facility-administered medications for this visit.     Review of Systems Review of Systems  Constitutional: Negative.   Respiratory: Negative.   Cardiovascular: Negative.     Blood pressure (!) 152/78, pulse 74, resp. rate 12, height _0  (1.626 m), weight 133 lb (60.3 kg).  Physical Exam Physical Exam  Constitutional: She is oriented to person, place, and time. She appears well-developed and well-nourished.  Eyes: Conjunctivae  are normal. No scleral icterus.  Neck: Neck supple.  Cardiovascular: Normal rate and regular rhythm.   Murmur heard.  Systolic murmur is present with a grade of 4/6  Pulmonary/Chest: Effort normal and breath sounds normal.  Abdominal: Soft. Normal appearance and bowel sounds are normal. She exhibits no distension and no mass. No hernia.  Lymphadenopathy:    She has no cervical adenopathy.    She has no axillary adenopathy.  Neurological: She is alert and oriented to person, place, and time.  Skin: Skin is warm and dry.    Data Reviewed  Notes, labs, path reports, endoscopies, CTs  Assessment    Gastric carcinoma, extremely small and localized. Located near the incisura. This would be amenable to a partial gastrectomy. The area apparently was inked by Dr. Gustavo Lah during the last endoscopy.  Heart murmur which has been evaluated in the past by cardiology. No workup required.  Plan    Will plan for a CT abdomen with contrast. If there is no evidence of lymph node involvement we will plan for a partial gastrectomy. Procedure, risks, benefits were all explained to the patient.   Patient has been scheduled for a CT abdomen with contrast at [place] for 06-19-16 at 9 am (arrive 8:45 am). Prep: no solids 4 hours prior but patient may have clear liquids up until exam time and pick up prep kit. Patient verbalizes understanding.      This has been scribed by Lesly Rubenstein LPN    Christene Lye 06/11/2016, 2:34 PM

## 2016-06-11 NOTE — Patient Instructions (Signed)
Partial Gastrectomy Partial gastrectomy is surgery to remove part of the stomach. This procedure may be done through one incision, or through several tiny incisions using a thin tube with a light and camera on the end (laparoscope). There are three types of partial gastrectomy. You may have a procedure to remove:  The lower third of the stomach (antrectomy).  About half of the stomach (partial gastrectomy).  Most of the stomach (subtotal gastrectomy). You may need this surgery if you have:  Stomach cancer.  A stomach tumor.  A condition that causes stomach bleeding or a hole in the stomach (stomach ulcer).  Scar tissue in the stomach that blocks the digestive tract. Tell a health care provider about:  Any allergies you have.  All medicines you are taking, including vitamins, herbs, eye drops, creams, and over-the-counter medicines.  Any problems you or family members have had with anesthetic medicines.  Any blood disorders you have.  Any surgeries you have had.  Any medical conditions you have.  Whether you are pregnant or may be pregnant. What are the risks? Generally, this is a safe procedure. However, problems may occur, including:  Infection.  Bleeding.  Allergic reactions to medicines.  Damage to other structures or organs.  Leaking of stomach juices.  Scar tissue that causes an obstruction.  A condition that causes abdominal cramps, nausea, diarrhea, heart palpitations, and dizziness after eating (dumping syndrome).  An area of weakness in the stomach muscle that may allow organs or tissues to bulge through (hernia).  A blood clot that forms in a leg and travels to the lungs (pulmonary embolism). What happens before the procedure?  Ask your health care provider about:  Changing or stopping your regular medicines. This is especially important if you are taking diabetes medicines or blood thinners.  Taking medicines such as aspirin and ibuprofen. These  medicines can thin your blood. Do not take these medicines before your procedure if your health care provider instructs you not to.  Follow instructions from your health care provider about eating or drinking restrictions.  Ask your health care provider how your surgical site will be marked or identified.  You may be given antibiotic medicine to help prevent infection.  Plan to have someone take you home after the procedure.  If you will be going home right after the procedure, plan to have someone with you for 24 hours. What happens during the procedure?  To reduce your risk of infection:  Your health care team will wash or sanitize their hands.  Your skin will be washed with soap.  An IV tube will be inserted into one of your veins.  You will be given one or more of the following:  A medicine to help you relax (sedative).  A medicine to make you fall asleep (general anesthetic).  A tube may be placed into your bladder to drain urine (catheter).  A tube will be inserted through your nose and down into your stomach to drain stomach fluids (nasogastric tube).  If your procedure will be done through one incision:  A long incision will be made from the top of your abdomen to your belly button area.  The muscles of your abdomen and the lining that contains your abdominal organs will be opened.  Your stomach attachment and some blood vessels may be divided and tied off.  Part of your stomach will be removed.  The remaining part of your stomach will be connected to your small intestine using stitches (sutures) or  staples.  If your procedure is being done through several smaller incisions:  Several small incisions will be made in different parts of your abdomen.  Carbon dioxide gas will be pumped into your abdomen through one of the incisions. The gas will gently inflate your abdomen, making it easier for your surgeon to see and work inside your abdomen.  The laparoscope  will be inserted through an incision. The laparoscope camera will send images to a TV monitor in the operating room. Your surgeon will use these images as a guide during the procedure.  Surgical instruments will be placed though another incision. These instruments will be used to remove part of your stomach and connect the remaining part of your stomach to your small intestine.  Your incision or incisions will be closed with sutures or staples. The procedure may vary among health care providers and hospitals. What happens after the procedure?  Your blood pressure, heart rate, breathing rate, and blood oxygen level will be monitored often until the medicines you were given have worn off.  You will be given pain medicine as needed. You may receive pain medicine through your IV tube until you can take it by mouth.  The nasogastric tube will be removed after you start to pass gas. After it has been removed, you will be given clear fluids.  You will receive fluids and nutrition through your IV tube until you can eat and drink on your own.  After you can drink fluids well, your IV tube will be removed and you will be able to start eating soft foods.  Your catheter will be removed when you are able to pass urine.  Do not drive for 24 hours if you received a sedative. This information is not intended to replace advice given to you by your health care provider. Make sure you discuss any questions you have with your health care provider. Document Released: 06/20/2009 Document Revised: 09/01/2015 Document Reviewed: 12/28/2014 Elsevier Interactive Patient Education  2017 [employer].

## 2016-06-19 ENCOUNTER — Ambulatory Visit
Admission: RE | Admit: 2016-06-19 | Discharge: 2016-06-19 | Disposition: A | Discharge status: Home / Self Care | Payer: Medicare Other | Source: Ambulatory Visit | Attending: General Surgery | Admitting: General Surgery

## 2016-06-19 DIAGNOSIS — C160 Malignant neoplasm of cardia: Secondary | ICD-10-CM

## 2016-06-19 DIAGNOSIS — I700 Atherosclerosis of aorta: Secondary | ICD-10-CM | POA: Insufficient documentation

## 2016-06-19 MED ORDER — IOPAMIDOL (ISOVUE-300) INJECTION 61%
100.0000 mL | Freq: Once | INTRAVENOUS | Status: AC | PRN
  Administered 2016-06-19: 100 mL via INTRAVENOUS

## 2016-06-22 ENCOUNTER — Telehealth: Payer: Self-pay | Admitting: General Surgery

## 2016-06-22 NOTE — Telephone Encounter (Signed)
Pt's CT of abdomen was reviewed. No lymphadenopathy or evidence of gastric mass. Pt was advised on this.  She is asked tome to office week after next for surgery scheduling.

## 2016-07-05 ENCOUNTER — Encounter: Payer: Self-pay | Admitting: General Surgery

## 2016-07-05 ENCOUNTER — Ambulatory Visit (INDEPENDENT_AMBULATORY_CARE_PROVIDER_SITE_OTHER): Payer: Medicare Other | Admitting: General Surgery

## 2016-07-05 VITALS — BP 140/72 | HR 66 | Resp 13 | Ht 64.0 in | Wt 125.0 lb

## 2016-07-05 DIAGNOSIS — C160 Malignant neoplasm of cardia: Secondary | ICD-10-CM

## 2016-07-05 NOTE — Progress Notes (Signed)
Patient ID: Stacy Cook, female   DOB: 1935-09-17, 81 y.o.   MRN: [id number]  Chief Complaint  Patient presents with  . Other    Discuss surgery    HPI Stacy Cook is a 81 y.o. female here today to discuss surgery options.Patient states she is doing well. Moves her bowels daily. She was diagnosed with a very small adenocarcinoma in stomach. HPI  Past Medical History:  Diagnosis Date  . Anemia   . Arthritis    back  . Basal cell carcinoma of skin 2016  . Cancer of right lung ([place]) 05/09/2015   Dr. Genevive Bi performed Right lower lobe lobectomy.   . Cataract   . Diabetes mellitus without complication ([place])   . Hypertension   . Murmur   . Osteoporosis   . Stomach tumor (benign)     Past Surgical History:  Procedure Laterality Date  . ABDOMINAL HYSTERECTOMY    . APPENDECTOMY    . BREAST BIOPSY Left 05/09/2016   path pending  . ESOPHAGOGASTRODUODENOSCOPY (EGD) WITH PROPOFOL N/A 03/27/2016   Procedure: ESOPHAGOGASTRODUODENOSCOPY (EGD) WITH PROPOFOL;  Surgeon: Lollie Sails, MD;  Location: [facility] ENDOSCOPY;  Service: Endoscopy;  Laterality: N/A;  . ESOPHAGOGASTRODUODENOSCOPY (EGD) WITH PROPOFOL N/A 05/28/2016   Procedure: ESOPHAGOGASTRODUODENOSCOPY (EGD) WITH PROPOFOL;  Surgeon: Lollie Sails, MD;  Location: [facility] ENDOSCOPY;  Service: Endoscopy;  Laterality: N/A;  . SHOULDER ARTHROSCOPY W/ CAPSULAR REPAIR Right   . THORACOTOMY Right 05/09/2015   Procedure: THORACOTOMY, RIGHT LOWER LOBECTOMY, BRONCHOSCOPY;  Surgeon: Nestor Lewandowsky, MD;  Location: [facility] ORS;  Service: Thoracic;  Laterality: Right;    Family History  Problem Relation Age of Onset  . Diabetes Other   . Breast cancer Neg Hx     Social History Social History  Substance Use Topics  . Smoking status: Never Smoker  . Smokeless tobacco: Never Used  . Alcohol use No    Allergies  Allergen Reactions  . Statins     Joint pain  . Sulfa Antibiotics Nausea And Vomiting  . Pneumococcal Vaccine Itching    Hives and  fever    Current Outpatient Prescriptions  Medication Sig Dispense Refill  . aspirin 81 MG tablet Take 81 mg by mouth daily. Reported on 06/03/2015    . cholecalciferol (VITAMIN D) 1000 UNITS tablet Take 1,000 Units by mouth daily.    . cyanocobalamin (V-R VITAMIN B-12) 500 MCG tablet Take by mouth.    Marland Kitchen HYDROcodone-acetaminophen (NORCO) 7.5-325 MG tablet Take 1-2 tablets by mouth every 4 (four) hours as needed for moderate pain or severe pain. 60 tablet 0  . losartan (COZAAR) 100 MG tablet Take 100 mg by mouth.    . metFORMIN (GLUCOPHAGE) 500 MG tablet Take 500 mg by mouth 2 (two) times daily with a meal.    . metoprolol succinate (TOPROL-XL) 25 MG 24 hr tablet Take 25 mg by mouth daily.     . Omega-3 Fatty Acids (FISH OIL) 1000 MG CAPS Take by mouth.    . Probiotic Product (PROBIOTIC DAILY PO) Take by mouth.    . RABEprazole (ACIPHEX) 20 MG tablet Take 20 mg by mouth 2 (two) times daily.     . predniSONE (DELTASONE) 20 MG tablet Take 2 pills once a day with food x AM; do not stop until directed. 60 tablet 0   No current facility-administered medications for this visit.     Review of Systems Review of Systems  Constitutional: Negative.   Respiratory: Negative.   Cardiovascular: Negative.  Gastrointestinal: Negative.     Blood pressure 140/72, pulse 66, resp. rate 13, height '5\' 4"'$  (1.626 m), weight 125 lb (56.7 kg).  Physical Exam Physical Exam  Constitutional: She is oriented to person, place, and time. She appears well-developed and well-nourished.  Eyes: Conjunctivae are normal. No scleral icterus.  Neck: Neck supple.  Cardiovascular: Normal rate and regular rhythm.   Murmur heard.  Systolic murmur is present with a grade of 4/6  Pulmonary/Chest: Effort normal and breath sounds normal.  Abdominal: Soft. Bowel sounds are normal. There is no hepatomegaly. There is no tenderness. No hernia.  Lymphadenopathy:    She has no cervical adenopathy.    She has no axillary  adenopathy.  Neurological: She is alert and oriented to person, place, and time.  Skin: Skin is warm and dry.    Data Reviewed Prior notes and labs reviewed   Assessment    Carcinoma stomach- focal at incisura. Path showed signet ring feature. Imaging with no apparent mass/nodes.  She is a candidate for partial gastrectomy. Procedure, risks and benefits explained to pt and she is agreeable.  Plan    Patient to be scheduled for a partial gastrectomy with upper endoscopy (to be completed in the O.R at time of surgery).    Patient's surgery has been scheduled for 08-03-16 at [facility]. It is okay for patient to continue 81 mg aspirin once daily.   This information has been scribed by Gaspar Cola CMA.   Klayton Monie G 07/06/2016, 2:43 PM

## 2016-07-06 ENCOUNTER — Inpatient Hospital Stay: Payer: Medicare Other

## 2016-07-06 ENCOUNTER — Inpatient Hospital Stay: Payer: Medicare Other | Attending: Internal Medicine

## 2016-07-06 ENCOUNTER — Inpatient Hospital Stay (HOSPITAL_BASED_OUTPATIENT_CLINIC_OR_DEPARTMENT_OTHER): Payer: Medicare Other | Admitting: Internal Medicine

## 2016-07-06 VITALS — BP 142/55 | HR 58 | Temp 96.6°F | Resp 18 | Wt 125.1 lb

## 2016-07-06 DIAGNOSIS — C169 Malignant neoplasm of stomach, unspecified: Secondary | ICD-10-CM

## 2016-07-06 DIAGNOSIS — I10 Essential (primary) hypertension: Secondary | ICD-10-CM

## 2016-07-06 DIAGNOSIS — Z85118 Personal history of other malignant neoplasm of bronchus and lung: Secondary | ICD-10-CM

## 2016-07-06 DIAGNOSIS — Z85828 Personal history of other malignant neoplasm of skin: Secondary | ICD-10-CM

## 2016-07-06 DIAGNOSIS — M818 Other osteoporosis without current pathological fracture: Secondary | ICD-10-CM

## 2016-07-06 DIAGNOSIS — R011 Cardiac murmur, unspecified: Secondary | ICD-10-CM

## 2016-07-06 DIAGNOSIS — I700 Atherosclerosis of aorta: Secondary | ICD-10-CM

## 2016-07-06 DIAGNOSIS — N200 Calculus of kidney: Secondary | ICD-10-CM

## 2016-07-06 DIAGNOSIS — Z9221 Personal history of antineoplastic chemotherapy: Secondary | ICD-10-CM | POA: Diagnosis not present

## 2016-07-06 DIAGNOSIS — E042 Nontoxic multinodular goiter: Secondary | ICD-10-CM | POA: Diagnosis not present

## 2016-07-06 DIAGNOSIS — C160 Malignant neoplasm of cardia: Secondary | ICD-10-CM

## 2016-07-06 DIAGNOSIS — M129 Arthropathy, unspecified: Secondary | ICD-10-CM

## 2016-07-06 DIAGNOSIS — D696 Thrombocytopenia, unspecified: Secondary | ICD-10-CM

## 2016-07-06 DIAGNOSIS — Z7984 Long term (current) use of oral hypoglycemic drugs: Secondary | ICD-10-CM

## 2016-07-06 DIAGNOSIS — Z79899 Other long term (current) drug therapy: Secondary | ICD-10-CM

## 2016-07-06 DIAGNOSIS — E119 Type 2 diabetes mellitus without complications: Secondary | ICD-10-CM

## 2016-07-06 DIAGNOSIS — I517 Cardiomegaly: Secondary | ICD-10-CM

## 2016-07-06 DIAGNOSIS — Z7982 Long term (current) use of aspirin: Secondary | ICD-10-CM

## 2016-07-06 DIAGNOSIS — D649 Anemia, unspecified: Secondary | ICD-10-CM

## 2016-07-06 LAB — COMPREHENSIVE METABOLIC PANEL
ALT: 12 U/L — ABNORMAL LOW (ref 14–54)
ANION GAP: 5 (ref 5–15)
AST: 20 U/L (ref 15–41)
Albumin: 4 g/dL (ref 3.5–5.0)
Alkaline Phosphatase: 70 U/L (ref 38–126)
BILIRUBIN TOTAL: 0.7 mg/dL (ref 0.3–1.2)
BUN: 21 mg/dL — ABNORMAL HIGH (ref 6–20)
CO2: 29 mmol/L (ref 22–32)
Calcium: 9.4 mg/dL (ref 8.9–10.3)
Chloride: 105 mmol/L (ref 101–111)
Creatinine, Ser: 1.11 mg/dL — ABNORMAL HIGH (ref 0.44–1.00)
GFR, EST AFRICAN AMERICAN: 54 mL/min — AB (ref >60)
GFR, EST NON AFRICAN AMERICAN: 46 mL/min — AB (ref >60)
Glucose, Bld: 101 mg/dL — ABNORMAL HIGH (ref 65–99)
POTASSIUM: 4.2 mmol/L (ref 3.5–5.1)
Sodium: 139 mmol/L (ref 135–145)
TOTAL PROTEIN: 7.1 g/dL (ref 6.5–8.1)

## 2016-07-06 LAB — CBC WITH DIFFERENTIAL/PLATELET
BASOS ABS: 0 10*3/uL (ref 0–0.1)
BASOS PCT: 1 %
EOS PCT: 2 %
Eosinophils Absolute: 0.1 10*3/uL (ref 0–0.7)
HEMATOCRIT: 34.4 % — AB (ref 35.0–47.0)
Hemoglobin: 11.8 g/dL — ABNORMAL LOW (ref 12.0–16.0)
LYMPHS PCT: 32 %
Lymphs Abs: 1.5 10*3/uL (ref 1.0–3.6)
MCH: 29.6 pg (ref 26.0–34.0)
MCHC: 34.3 g/dL (ref 32.0–36.0)
MCV: 86.4 fL (ref 80.0–100.0)
MONO ABS: 0.3 10*3/uL (ref 0.2–0.9)
MONOS PCT: 6 %
NEUTROS ABS: 2.7 10*3/uL (ref 1.4–6.5)
Neutrophils Relative %: 59 %
Platelets: 101 10*3/uL — ABNORMAL LOW (ref 150–440)
RBC: 3.99 MIL/uL (ref 3.80–5.20)
RDW: 13.5 % (ref 11.5–14.5)
WBC: 4.5 10*3/uL (ref 3.6–11.0)

## 2016-07-06 MED ORDER — PREDNISONE 20 MG PO TABS: ORAL_TABLET | ORAL | 0 refills | Status: DC

## 2016-07-06 NOTE — Assessment & Plan Note (Addendum)
#   Signet ring adenocarcinoma- gastric incisura; STAGE I;PET-NED. Unfortunately, repeat surveillance endoscopy/ Bx- recurrent signet ring adenocarcinoma. Awaiting partial gastrectomy with Dr. Jamal Collin on April 27.    #  Right lung adenocarcinoma stage IB [ positive for  Visceral pleural invasion/ size 2.3 cm] Status post lobectomy. No adjuvant chemo. CT Chest NED.    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 Plan steroids prednsione 40 mg/day- in anticipation of gastrectomy. Discussed the potential side effects- including elevated blood sugars/insomnia.   # DM- recommend taking metformin 500 BID; check blood sugars closely.   # weekly cbc/ follow up 3 weeks/labs/MD.

## 2016-07-06 NOTE — Progress Notes (Signed)
[name] NOTE  Patient Care Team: Adin Hector, MD as PCP - General (Internal Medicine) Cammie Sickle, MD as Consulting Physician (Internal Medicine) Christene Lye, MD (General Surgery)  CHIEF COMPLAINTS/PURPOSE OF CONSULTATION:   Oncology History   # FEB 2017- ADENOCARCINOMA with Lepidic 80%-20% acinar pattern; pT2a [Stage IB;T-2.3cm; visceral pleural invasion present; pN=0 ]; AUG 2017- CT NED  # DEC 2017- [place] ca [GATA; her 2 Neu-NEG]; signet ring [1.5 x3 mm gastric incisura; Dr.Skulskie]; EUS [Dr.Burnbridge]; no significant abnormality noted;  Reviewed at [facility] also. JAN 2018- PET NED.      Primary malignant neoplasm of right lower lobe of lung ([place])   12/02/2015 Initial Diagnosis    Primary malignant neoplasm of right lower lobe of lung ([facility])      Adenocarcinoma of gastric cardia ([place])   04/10/2016 Initial Diagnosis    Adenocarcinoma of gastric cardia ([place])       HISTORY OF PRESENTING ILLNESS:  Stacy Cook 81 y.o.  female patient Stage I lung cancer adenocarcinoma; Prior history of stomach carcinoid- is here to follow-up regarding a new diagnosis of "gastric cancer".   Patient has been evaluated by Dr. Jamal Collin from surgery; and given the "recurrent" gastric cancer stage I; surgery planned on April 27.  Denies any easy bruising. Denies any difficulty swallowing. Denies any nausea vomiting. No weight loss. She denies any fevers or chills. Appetite is improving. No cough or shortness of breath or chest pain. No bleeding. States her blood sugars are under good control.  ROS: A complete 10 point review of system is done which is negative except mentioned above in history of present illness  MEDICAL HISTORY:  Past Medical History:  Diagnosis Date  . Anemia   . Arthritis    back  . Basal cell carcinoma of skin 2016  . Cancer of right lung ([place]) 05/09/2015   Dr. Genevive Bi performed Right lower lobe lobectomy.   . Cataract   . Diabetes  mellitus without complication ([place])   . Hypertension   . Murmur   . Osteoporosis   . Stomach tumor (benign)     SURGICAL HISTORY: Past Surgical History:  Procedure Laterality Date  . ABDOMINAL HYSTERECTOMY    . APPENDECTOMY    . BREAST BIOPSY Left 05/09/2016   path pending  . ESOPHAGOGASTRODUODENOSCOPY (EGD) WITH PROPOFOL N/A 03/27/2016   Procedure: ESOPHAGOGASTRODUODENOSCOPY (EGD) WITH PROPOFOL;  Surgeon: Lollie Sails, MD;  Location: [facility] ENDOSCOPY;  Service: Endoscopy;  Laterality: N/A;  . ESOPHAGOGASTRODUODENOSCOPY (EGD) WITH PROPOFOL N/A 05/28/2016   Procedure: ESOPHAGOGASTRODUODENOSCOPY (EGD) WITH PROPOFOL;  Surgeon: Lollie Sails, MD;  Location: [facility] ENDOSCOPY;  Service: Endoscopy;  Laterality: N/A;  . SHOULDER ARTHROSCOPY W/ CAPSULAR REPAIR Right   . THORACOTOMY Right 05/09/2015   Procedure: THORACOTOMY, RIGHT LOWER LOBECTOMY, BRONCHOSCOPY;  Surgeon: Nestor Lewandowsky, MD;  Location: [facility] ORS;  Service: Thoracic;  Laterality: Right;    SOCIAL HISTORY: Social History   Social History  . Marital status: Widowed    Spouse name: N/A  . Number of children: N/A  . Years of education: N/A   Occupational History  . Not on file.   Social History Main Topics  . Smoking status: Never Smoker  . Smokeless tobacco: Never Used  . Alcohol use No  . Drug use: No  . Sexual activity: Not on file   Other Topics Concern  . Not on file   Social History Narrative  . No narrative on file  FAMILY HISTORY: Family History  Problem Relation Age of Onset  . Diabetes Other   . Breast cancer Neg Hx     ALLERGIES:  is allergic to statins; sulfa antibiotics; and pneumococcal vaccine.  MEDICATIONS:  Current Outpatient Prescriptions  Medication Sig Dispense Refill  . aspirin 81 MG tablet Take 81 mg by mouth daily. Reported on 06/03/2015    . cholecalciferol (VITAMIN D) 1000 UNITS tablet Take 1,000 Units by mouth daily.    . cyanocobalamin (V-R VITAMIN B-12) 500 MCG tablet Take  by mouth.    Marland Kitchen HYDROcodone-acetaminophen (NORCO) 7.5-325 MG tablet Take 1-2 tablets by mouth every 4 (four) hours as needed for moderate pain or severe pain. 60 tablet 0  . losartan (COZAAR) 100 MG tablet Take 100 mg by mouth.    . metFORMIN (GLUCOPHAGE) 500 MG tablet Take 500 mg by mouth 2 (two) times daily with a meal.    . metoprolol succinate (TOPROL-XL) 25 MG 24 hr tablet Take 25 mg by mouth daily.     . Omega-3 Fatty Acids (FISH OIL) 1000 MG CAPS Take by mouth.    . Probiotic Product (PROBIOTIC DAILY PO) Take by mouth.    . RABEprazole (ACIPHEX) 20 MG tablet Take 20 mg by mouth 2 (two) times daily.     . predniSONE (DELTASONE) 20 MG tablet Take 2 pills once a day with food x AM; do not stop until directed. 60 tablet 0   No current facility-administered medications for this visit.       Marland Kitchen  PHYSICAL EXAMINATION: ECOG PERFORMANCE STATUS: 0 - Asymptomatic  Vitals:   07/06/16 1115  BP: (!) 142/55  Pulse: (!) 58  Resp: 18  Temp: (!) 96.6 F (35.9 C)   Filed Weights   07/06/16 1115  Weight: 125 lb 2 oz (56.8 kg)    GENERAL: Well-nourished well-developed; Alert, no distress and comfortable.   Alone EYES: no pallor or icterus OROPHARYNX: no thrush or ulceration; good dentition  NECK: supple, no masses felt LYMPH:  no palpable lymphadenopathy in the cervical, axillary or inguinal regions LUNGS: clear to auscultation and  No wheeze or crackles HEART/CVS: regular rate & rhythm; positive for murmur. No lower extremity edema ABDOMEN: abdomen soft, non-tender and normal bowel sounds Musculoskeletal:no cyanosis of digits and no clubbing  PSYCH: alert & oriented x 3 with fluent speech NEURO: no focal motor/sensory deficits     LABORATORY DATA:  I have reviewed the data as listed Lab Results  Component Value Date   WBC 4.5 07/06/2016   HGB 11.8 (L) 07/06/2016   HCT 34.4 (L) 07/06/2016   MCV 86.4 07/06/2016   PLT 101 (L) 07/06/2016    Recent Labs  12/02/15 1001  04/10/16 1411 06/08/16 0920 07/06/16 1015  NA 139 141 139 139  K 4.8 4.3 3.9 4.2  CL 106 104 104 105  CO2 '28 29 27 29  '$ GLUCOSE 101* 88 108* 101*  BUN 24* 16 14 21*  CREATININE 0.99 1.06* 0.90 1.11*  CALCIUM 8.8* 9.2 9.1 9.4  GFRNONAA 54* 49* 60* 46*  GFRAA >60 57* >60 54*  PROT 7.0 7.2  --  7.1  ALBUMIN 4.2 4.3  --  4.0  AST 24 21  --  20  ALT 21 11*  --  12*  ALKPHOS 64 68  --  70  BILITOT 0.6 0.8  --  0.7    RADIOGRAPHIC STUDIES: I have personally reviewed the radiological images as listed and agreed with the findings in the  report. Ct Abdomen W Contrast  Result Date: 06/19/2016 CLINICAL DATA:  Followup gastric cancer EXAM: CT ABDOMEN WITH CONTRAST TECHNIQUE: Multidetector CT imaging of the abdomen was performed using the standard protocol following bolus administration of intravenous contrast. CONTRAST:  165m ISOVUE-300 IOPAMIDOL (ISOVUE-300) INJECTION 61% COMPARISON:  PET-CT from 04/17/2014 FINDINGS: Lower chest: Postsurgical changes involving the right lower lung. Small amount of right pleural fluid is again noted, unchanged the image number 10 of series 2. Hepatobiliary: No focal liver abnormality is seen. No gallstones, gallbladder wall thickening, or biliary dilatation. Pancreas: Unremarkable. No pancreatic ductal dilatation or surrounding inflammatory changes. Spleen: Normal in size without focal abnormality. Adrenals/Urinary Tract: Normal appearance of the adrenal glands. The right kidney appears normal. Large stone in the inferior pole the left kidney is again noted. No mass or hydronephrosis. Stomach/Bowel: No gastric mass identified. The visualized upper abdominal bowel loops are unremarkable. Vascular/Lymphatic: Aortic atherosclerosis. No aneurysm. No upper abdominal adenopathy identified. Other: There is no ascites or focal fluid collections within the abdomen or pelvis. Musculoskeletal: Degenerative disc disease noted within the lumbar spine. No aggressive lytic or  sclerotic bone lesions. IMPRESSION: 1. No acute findings and no evidence for residual/recurrent tumor within the abdomen. 2. Similar appearance of postsurgical changes in the right lung base with small pleural. 3. Aortic atherosclerosis. Electronically Signed   By: TKerby MoorsM.D.   On: 06/19/2016 09:42    ASSESSMENT & PLAN:  Adenocarcinoma of gastric cardia ([place] # Signet ring adenocarcinoma- gastric incisura; STAGE I;PET-NED. Unfortunately, repeat surveillance endoscopy/ Bx- recurrent signet ring adenocarcinoma. Awaiting partial gastrectomy with Dr. SJamal Collinon April 27.    #  Right lung adenocarcinoma stage IB [ positive for  Visceral pleural invasion/ size 2.3 cm] Status post lobectomy. No adjuvant chemo. CT Chest NED.    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 Plan steroids prednsione 40 mg/day- in anticipation of gastrectomy. Discussed the potential side effects- including elevated blood sugars/insomnia.   # DM- recommend taking metformin 500 BID; check blood sugars closely.   # weekly cbc/ follow up 3 weeks/labs/MD.     GCammie Sickle MD 07/06/2016 12:36 PM

## 2016-07-06 NOTE — Progress Notes (Signed)
Patient offers no complaints today.  Patient saw her surgeon yesterday and is scheduled for surgery on 08-03-16.

## 2016-07-08 DIAGNOSIS — C169 Malignant neoplasm of stomach, unspecified: Secondary | ICD-10-CM

## 2016-07-08 HISTORY — DX: Malignant neoplasm of stomach, unspecified: C16.9

## 2016-07-13 ENCOUNTER — Inpatient Hospital Stay: Payer: Medicare Other | Attending: Internal Medicine

## 2016-07-13 DIAGNOSIS — R011 Cardiac murmur, unspecified: Secondary | ICD-10-CM | POA: Insufficient documentation

## 2016-07-13 DIAGNOSIS — D696 Thrombocytopenia, unspecified: Secondary | ICD-10-CM | POA: Insufficient documentation

## 2016-07-13 DIAGNOSIS — C160 Malignant neoplasm of cardia: Secondary | ICD-10-CM | POA: Diagnosis present

## 2016-07-13 DIAGNOSIS — Z7982 Long term (current) use of aspirin: Secondary | ICD-10-CM | POA: Diagnosis not present

## 2016-07-13 DIAGNOSIS — Z85118 Personal history of other malignant neoplasm of bronchus and lung: Secondary | ICD-10-CM | POA: Diagnosis not present

## 2016-07-13 DIAGNOSIS — M818 Other osteoporosis without current pathological fracture: Secondary | ICD-10-CM | POA: Diagnosis not present

## 2016-07-13 DIAGNOSIS — K219 Gastro-esophageal reflux disease without esophagitis: Secondary | ICD-10-CM | POA: Diagnosis not present

## 2016-07-13 DIAGNOSIS — I10 Essential (primary) hypertension: Secondary | ICD-10-CM | POA: Diagnosis not present

## 2016-07-13 DIAGNOSIS — R0602 Shortness of breath: Secondary | ICD-10-CM | POA: Insufficient documentation

## 2016-07-13 DIAGNOSIS — E119 Type 2 diabetes mellitus without complications: Secondary | ICD-10-CM | POA: Insufficient documentation

## 2016-07-13 DIAGNOSIS — Z7984 Long term (current) use of oral hypoglycemic drugs: Secondary | ICD-10-CM | POA: Diagnosis not present

## 2016-07-13 DIAGNOSIS — Z85828 Personal history of other malignant neoplasm of skin: Secondary | ICD-10-CM | POA: Diagnosis not present

## 2016-07-13 DIAGNOSIS — Z79899 Other long term (current) drug therapy: Secondary | ICD-10-CM | POA: Insufficient documentation

## 2016-07-13 LAB — CBC WITH DIFFERENTIAL/PLATELET
Basophils Absolute: 0 10*3/uL (ref 0–0.1)
Basophils Relative: 0 %
EOS PCT: 0 %
Eosinophils Absolute: 0 10*3/uL (ref 0–0.7)
HCT: 36.1 % (ref 35.0–47.0)
HEMOGLOBIN: 12.4 g/dL (ref 12.0–16.0)
LYMPHS ABS: 2.2 10*3/uL (ref 1.0–3.6)
Lymphocytes Relative: 27 %
MCH: 30 pg (ref 26.0–34.0)
MCHC: 34.4 g/dL (ref 32.0–36.0)
MCV: 87.2 fL (ref 80.0–100.0)
MONOS PCT: 7 %
Monocytes Absolute: 0.6 10*3/uL (ref 0.2–0.9)
Neutro Abs: 5.3 10*3/uL (ref 1.4–6.5)
Neutrophils Relative %: 66 %
PLATELETS: 112 10*3/uL — AB (ref 150–440)
RBC: 4.14 MIL/uL (ref 3.80–5.20)
RDW: 14 % (ref 11.5–14.5)
WBC: 8.1 10*3/uL (ref 3.6–11.0)

## 2016-07-16 ENCOUNTER — Telehealth: Payer: Self-pay | Admitting: *Deleted

## 2016-07-16 NOTE — Telephone Encounter (Signed)
Attempted to leave a vm. My chart msg sent to patient.

## 2016-07-16 NOTE — Telephone Encounter (Signed)
-----   Message from Cammie Sickle, MD sent at 07/13/2016  6:13 PM EDT ----- Please inform patient-platelets improved;and to continue the current dose of steroids 40 mg a day until next Friday; 4/13; we will continue directions regarding steroids.

## 2016-07-20 ENCOUNTER — Inpatient Hospital Stay: Payer: Medicare Other

## 2016-07-20 DIAGNOSIS — C160 Malignant neoplasm of cardia: Secondary | ICD-10-CM

## 2016-07-20 LAB — CBC WITH DIFFERENTIAL/PLATELET
BASOS PCT: 1 %
Basophils Absolute: 0.1 10*3/uL (ref 0–0.1)
EOS ABS: 0 10*3/uL (ref 0–0.7)
Eosinophils Relative: 1 %
HCT: 36.2 % (ref 35.0–47.0)
HEMOGLOBIN: 12.4 g/dL (ref 12.0–16.0)
Lymphocytes Relative: 26 %
Lymphs Abs: 2.3 10*3/uL (ref 1.0–3.6)
MCH: 30.1 pg (ref 26.0–34.0)
MCHC: 34.3 g/dL (ref 32.0–36.0)
MCV: 87.8 fL (ref 80.0–100.0)
MONO ABS: 0.5 10*3/uL (ref 0.2–0.9)
MONOS PCT: 6 %
NEUTROS PCT: 66 %
Neutro Abs: 6 10*3/uL (ref 1.4–6.5)
Platelets: 120 10*3/uL — ABNORMAL LOW (ref 150–440)
RBC: 4.13 MIL/uL (ref 3.80–5.20)
RDW: 13.6 % (ref 11.5–14.5)
WBC: 9 10*3/uL (ref 3.6–11.0)

## 2016-07-23 ENCOUNTER — Telehealth: Payer: Self-pay | Admitting: *Deleted

## 2016-07-23 NOTE — Telephone Encounter (Signed)
Pt informed to take Prednisone 20 mg daily. Keep f/u apts as planned. Teach back process performed with patient.

## 2016-07-23 NOTE — Telephone Encounter (Signed)
-----   Message from Cammie Sickle, MD sent at 07/23/2016  2:16 PM EDT ----- Please inform pt- Taper prednisone to '20mg'$  once a day; follow up/labs as planned. Dr.B

## 2016-07-26 ENCOUNTER — Inpatient Hospital Stay (HOSPITAL_BASED_OUTPATIENT_CLINIC_OR_DEPARTMENT_OTHER): Payer: Medicare Other | Admitting: Internal Medicine

## 2016-07-26 ENCOUNTER — Inpatient Hospital Stay: Payer: Medicare Other

## 2016-07-26 VITALS — BP 139/56 | HR 66 | Temp 97.6°F | Resp 18 | Ht 64.0 in | Wt 132.0 lb

## 2016-07-26 DIAGNOSIS — Z7982 Long term (current) use of aspirin: Secondary | ICD-10-CM

## 2016-07-26 DIAGNOSIS — D696 Thrombocytopenia, unspecified: Secondary | ICD-10-CM | POA: Diagnosis not present

## 2016-07-26 DIAGNOSIS — R0602 Shortness of breath: Secondary | ICD-10-CM

## 2016-07-26 DIAGNOSIS — Z85828 Personal history of other malignant neoplasm of skin: Secondary | ICD-10-CM | POA: Diagnosis not present

## 2016-07-26 DIAGNOSIS — Z79899 Other long term (current) drug therapy: Secondary | ICD-10-CM

## 2016-07-26 DIAGNOSIS — K219 Gastro-esophageal reflux disease without esophagitis: Secondary | ICD-10-CM

## 2016-07-26 DIAGNOSIS — I10 Essential (primary) hypertension: Secondary | ICD-10-CM | POA: Diagnosis not present

## 2016-07-26 DIAGNOSIS — E119 Type 2 diabetes mellitus without complications: Secondary | ICD-10-CM

## 2016-07-26 DIAGNOSIS — R011 Cardiac murmur, unspecified: Secondary | ICD-10-CM | POA: Diagnosis not present

## 2016-07-26 DIAGNOSIS — C160 Malignant neoplasm of cardia: Secondary | ICD-10-CM

## 2016-07-26 DIAGNOSIS — Z7984 Long term (current) use of oral hypoglycemic drugs: Secondary | ICD-10-CM | POA: Diagnosis not present

## 2016-07-26 DIAGNOSIS — Z85118 Personal history of other malignant neoplasm of bronchus and lung: Secondary | ICD-10-CM

## 2016-07-26 DIAGNOSIS — M818 Other osteoporosis without current pathological fracture: Secondary | ICD-10-CM

## 2016-07-26 LAB — CBC WITH DIFFERENTIAL/PLATELET
BASOS PCT: 1 %
Basophils Absolute: 0 10*3/uL (ref 0–0.1)
Eosinophils Absolute: 0.1 10*3/uL (ref 0–0.7)
Eosinophils Relative: 1 %
HCT: 32.7 % — ABNORMAL LOW (ref 35.0–47.0)
Hemoglobin: 11.2 g/dL — ABNORMAL LOW (ref 12.0–16.0)
LYMPHS ABS: 2.1 10*3/uL (ref 1.0–3.6)
Lymphocytes Relative: 24 %
MCH: 30.3 pg (ref 26.0–34.0)
MCHC: 34.1 g/dL (ref 32.0–36.0)
MCV: 88.6 fL (ref 80.0–100.0)
MONO ABS: 0.5 10*3/uL (ref 0.2–0.9)
MONOS PCT: 6 %
NEUTROS ABS: 6 10*3/uL (ref 1.4–6.5)
Neutrophils Relative %: 68 %
Platelets: 100 10*3/uL — ABNORMAL LOW (ref 150–440)
RBC: 3.69 MIL/uL — ABNORMAL LOW (ref 3.80–5.20)
RDW: 13.8 % (ref 11.5–14.5)
WBC: 8.7 10*3/uL (ref 3.6–11.0)

## 2016-07-26 NOTE — Progress Notes (Signed)
Patient here for gastric cancer. She has no medical complaints today.

## 2016-07-26 NOTE — Assessment & Plan Note (Addendum)
#   Signet ring adenocarcinoma- gastric incisura; STAGE I;PET-NED. Unfortunately, repeat surveillance endoscopy/ Bx- recurrent signet ring adenocarcinoma. Awaiting partial gastrectomy with Dr. Jamal Collin on April 27th. Discussed with Dr.Sankar.   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 Status post prednsione 40 mg/day- in anticipation of gastrectomy. Platelets still at 100.  Pt intolerant; no significant improvement noted; discussed re: IVIG if Dr.Sankar needs platelets > 100. Stop prednisone 20 mgday x3 more days; and then STOP.   #  Right lung adenocarcinoma stage IB [ positive for  Visceral pleural invasion/ size 2.3 cm] Status post lobectomy. No adjuvant chemo. CT Chest NED.    # follow up 4-6 weeks post surgery/cbc.   Addendum discussed with Dr. Jamal Collin; he plans to repeat a CBC on 4/23- if platelets less than 100 recommend IVIG. He will inform us.

## 2016-07-26 NOTE — Progress Notes (Signed)
[place] NOTE  Patient Care Team: Adin Hector, MD as PCP - General (Internal Medicine) Cammie Sickle, MD as Consulting Physician (Internal Medicine) Christene Lye, MD (General Surgery)  CHIEF COMPLAINTS/PURPOSE OF CONSULTATION:   Oncology History   # FEB 2017- ADENOCARCINOMA with Lepidic 80%-20% acinar pattern; pT2a [Stage IB;T-2.3cm; visceral pleural invasion present; pN=0 ]; AUG 2017- CT NED  # DEC 2017- [place] ca [GATA; her 2 Neu-NEG]; signet ring [1.5 x3 mm gastric incisura; Dr.Skulskie]; EUS [Dr.Burnbridge]; no significant abnormality noted;  Reviewed at [facility] also. JAN 2018- PET NED.      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  female patient Stage I lung cancer adenocarcinoma; Prior history of stomach carcinoid- is here to follow-up regarding a new diagnosis of "gastric cancer".  In the interim patient was started on prednisone approximately 3 weeks ago- 4 platelets of around 100 for possible ITP.  Patient denies any bleeding. Patient denies any rash. However she complains of palpitations/shortness of breath/abdominal discomfort since starting taking the steroids. She wants to come off the prednisone.  Patient has been evaluated by Dr. Jamal Collin from surgery; and given the "recurrent" gastric cancer stage I; surgery planned on April 27.  ROS: A complete 10 point review of system is done which is negative except mentioned above in history of present illness  MEDICAL HISTORY:  Past Medical History:  Diagnosis Date  . Anemia   . Arthritis    back  . Basal cell carcinoma of skin 2016  . Cancer of right lung ([place]) 05/09/2015   Dr. Genevive Bi  performed Right lower lobe lobectomy.   . Cataract   . Diabetes mellitus without complication ([place])   . GERD (gastroesophageal reflux disease)   . Hypertension   . Murmur   . Osteoporosis   . Stomach tumor (benign)     SURGICAL HISTORY: Past Surgical History:  Procedure Laterality Date  . ABDOMINAL HYSTERECTOMY    . APPENDECTOMY    . ESOPHAGOGASTRODUODENOSCOPY (EGD) WITH PROPOFOL N/A 03/27/2016   Procedure: ESOPHAGOGASTRODUODENOSCOPY (EGD) WITH PROPOFOL;  Surgeon: Lollie Sails, MD;  Location: [facility] ENDOSCOPY;  Service: Endoscopy;  Laterality: N/A;  . ESOPHAGOGASTRODUODENOSCOPY (EGD) WITH PROPOFOL N/A 05/28/2016   Procedure: ESOPHAGOGASTRODUODENOSCOPY (EGD) WITH PROPOFOL;  Surgeon: Lollie Sails, MD;  Location: [facility] ENDOSCOPY;  Service: Endoscopy;  Laterality: N/A;  . SHOULDER ARTHROSCOPY W/ CAPSULAR REPAIR Right   . THORACOTOMY Right 05/09/2015   Procedure: THORACOTOMY, RIGHT LOWER LOBECTOMY, BRONCHOSCOPY;  Surgeon: Nestor Lewandowsky, MD;  Location: [facility] ORS;  Service: Thoracic;  Laterality: Right;    SOCIAL HISTORY: Social History   Social History  . Marital status: Widowed    Spouse name: N/A  . Number of children: N/A  . Years of education: N/A   Occupational History  . Not on file.   Social History Main Topics  . Smoking status: Never Smoker  . Smokeless tobacco: Never Used  . Alcohol use No  . Drug use: No  . Sexual activity: Not on file   Other Topics Concern  . Not on file   Social History Narrative  . No narrative on file  FAMILY HISTORY: Family History  Problem Relation Age of Onset  . Diabetes Other   . Breast cancer Neg Hx     ALLERGIES:  is allergic to statins; sulfa antibiotics; and pneumococcal vaccine.  MEDICATIONS:  Current Outpatient Prescriptions  Medication Sig Dispense Refill  . aspirin 81 MG tablet Take 81 mg by mouth daily. Reported on 06/03/2015    . calcium carbonate (TUMS - DOSED IN MG ELEMENTAL CALCIUM) 500 MG chewable tablet  Chew 2 tablets by mouth 2 (two) times daily as needed for indigestion or heartburn.    . cholecalciferol (VITAMIN D) 1000 UNITS tablet Take 1,000 Units by mouth daily.    . cyanocobalamin (V-R VITAMIN B-12) 500 MCG tablet Take 500 mcg by mouth daily.     Marland Kitchen losartan (COZAAR) 100 MG tablet Take 100 mg by mouth daily.     . metFORMIN (GLUCOPHAGE) 500 MG tablet Take 500 mg by mouth daily with breakfast.     . metoprolol succinate (TOPROL-XL) 25 MG 24 hr tablet Take 25 mg by mouth every evening.     . Omega-3 Fatty Acids (FISH OIL) 1000 MG CAPS Take 1,000 mg by mouth 2 (two) times daily.     . predniSONE (DELTASONE) 20 MG tablet Take 2 pills once a day with food x AM; do not stop until directed. (Patient taking differently: Take 20 mg by mouth daily with breakfast. ) 60 tablet 0  . RABEprazole (ACIPHEX) 20 MG tablet Take 20 mg by mouth 2 (two) times daily. Has stopped prior to procedure    . tizanidine (ZANAFLEX) 2 MG capsule Take 2 mg by mouth 2 (two) times daily.    . Probiotic Product (PROBIOTIC DAILY PO) Take 1 tablet by mouth daily.      No current facility-administered medications for this visit.       Marland Kitchen  PHYSICAL EXAMINATION: ECOG PERFORMANCE STATUS: 0 - Asymptomatic  Vitals:   07/26/16 1025  BP: (!) 139/56  Pulse: 66  Resp: 18  Temp: 97.6 F (36.4 C)   Filed Weights   07/26/16 1023  Weight: 132 lb (59.9 kg)    GENERAL: Well-nourished well-developed; Alert, no distress and comfortable.   Alone EYES: no pallor or icterus OROPHARYNX: no thrush or ulceration; good dentition  NECK: supple, no masses felt LYMPH:  no palpable lymphadenopathy in the cervical, axillary or inguinal regions LUNGS: clear to auscultation and  No wheeze or crackles HEART/CVS: regular rate & rhythm; positive for murmur. No lower extremity edema ABDOMEN: abdomen soft, non-tender and normal bowel sounds Musculoskeletal:no cyanosis of digits and no clubbing  PSYCH: alert & oriented x 3 with fluent  speech NEURO: no focal motor/sensory deficits     LABORATORY DATA:  I have reviewed the data as listed Lab Results  Component Value Date   WBC 8.7 07/26/2016   HGB 11.2 (L) 07/26/2016   HCT 32.7 (L) 07/26/2016   MCV 88.6 07/26/2016   PLT 100 (L) 07/26/2016    Recent Labs  12/02/15 1001 04/10/16 1411 06/08/16 0920 07/06/16 1015  NA 139 141 139 139  K 4.8 4.3 3.9 4.2  CL 106 104 104 105  CO2 '28 29 27 29  '$ GLUCOSE 101* 88 108* 101*  BUN 24* 16 14 21*  CREATININE 0.99 1.06* 0.90 1.11*  CALCIUM 8.8* 9.2 9.1 9.4  GFRNONAA 54* 49* 60* 46*  GFRAA >60 57* >60 54*  PROT 7.0 7.2  --  7.1  ALBUMIN 4.2 4.3  --  4.0  AST 24  21  --  20  ALT 21 11*  --  12*  ALKPHOS 64 68  --  70  BILITOT 0.6 0.8  --  0.7    RADIOGRAPHIC STUDIES: I have personally reviewed the radiological images as listed and agreed with the findings in the report. No results found.  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 Unfortunately, repeat surveillance endoscopy/ Bx- recurrent signet ring adenocarcinoma. Awaiting partial gastrectomy with Dr. Jamal Collin on April 27th. Discussed with Dr.Sankar.   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 Status post prednsione 40 mg/day- in anticipation of gastrectomy. Platelets still at 100.  Pt intolerant; no significant improvement noted; discussed re: IVIG if Dr.Sankar needs platelets > 100. Stop prednisone 20 mgday x3 more days; and then STOP.   #  Right lung adenocarcinoma stage IB [ positive for  Visceral pleural invasion/ size 2.3 cm] Status post lobectomy. No adjuvant chemo. CT Chest NED.    # follow up 4-6 weeks post surgery/cbc.   Addendum discussed with Dr. Jamal Collin; he plans to repeat a CBC on 4/23- if platelets less than 100 recommend IVIG. He will inform us.     Cammie Sickle, MD 07/29/2016 7:37 PM

## 2016-07-27 ENCOUNTER — Other Ambulatory Visit: Payer: Self-pay

## 2016-07-27 ENCOUNTER — Telehealth: Payer: Self-pay

## 2016-07-27 ENCOUNTER — Encounter
Admission: RE | Admit: 2016-07-27 | Discharge: 2016-07-27 | Disposition: A | Discharge status: Home / Self Care | Payer: Medicare Other | Source: Ambulatory Visit | Attending: General Surgery | Admitting: General Surgery

## 2016-07-27 DIAGNOSIS — C169 Malignant neoplasm of stomach, unspecified: Secondary | ICD-10-CM | POA: Diagnosis not present

## 2016-07-27 DIAGNOSIS — Z01812 Encounter for preprocedural laboratory examination: Secondary | ICD-10-CM | POA: Insufficient documentation

## 2016-07-27 DIAGNOSIS — D693 Immune thrombocytopenic purpura: Secondary | ICD-10-CM

## 2016-07-27 HISTORY — DX: Gastro-esophageal reflux disease without esophagitis: K21.9

## 2016-07-27 LAB — SURGICAL PCR SCREEN
MRSA, PCR: NEGATIVE
Staphylococcus aureus: NEGATIVE

## 2016-07-27 NOTE — Patient Instructions (Signed)
Your procedure is scheduled on: Friday 08/03/16 Report to [facility]. 2ND FLOOR MEDICAL MALL ENTRANCE. To find out your arrival time please call [phone] between 1PM - 3PM on Thursday 08/02/16.  Remember: Instructions that are not followed completely may result in serious medical risk, up to and including death, or upon the discretion of your surgeon and anesthesiologist your surgery may need to be rescheduled.    __X__ 1. Do not eat food or drink liquids after midnight. No gum chewing or hard candies.     __X__ 2. No Alcohol for 24 hours before or after surgery.   ____ 3. Bring all medications with you on the day of surgery if instructed.    __X__ 4. Notify your doctor if there is any change in your medical condition     (cold, fever, infections).             ___X__5. No smoking within 24 hours of your surgery.     Do not wear jewelry, make-up, hairpins, clips or nail polish.  Do not wear lotions, powders, or perfumes.   Do not shave 48 hours prior to surgery. Men may shave face and neck.  Do not bring valuables to the hospital.    [facility] is not responsible for any belongings or valuables.               Contacts, dentures or bridgework may not be worn into surgery.  Leave your suitcase in the car. After surgery it may be brought to your room.  For patients admitted to the hospital, discharge time is determined by your                treatment team.   Patients discharged the day of surgery will not be allowed to drive home.   Please read over the following fact sheets that you were given:   Pain Booklet and MRSA Information   __X__ Take these medicines the morning of surgery with A SIP OF WATER:    1. LOSARTAN  2. TIZANIDINE  3.   4.  5.  6.  ____ Fleet Enema (as directed)   __X__ Use CHG Soap as directed  ____ Use inhalers on the day of surgery  __X__ Stop metformin 2 days prior to surgery NONE WED OR THURS   ____ Take 1/2 of usual insulin dose the night  before surgery and none on the morning of surgery.   __X__ Stop Coumadin/Plavix/aspirin on TODAY  __X__ Stop Anti-inflammatories such as Advil, Aleve, Ibuprofen, Motrin, Naproxen, Naprosyn, Goodies,powder, or aspirin products.  OK to take Tylenol.   __X__ Stop supplements until after surgery.  B12 AND FISH OIL  ____ Bring C-Pap to the hospital.

## 2016-07-27 NOTE — Telephone Encounter (Signed)
Patient notified to have labs drawn at [facility] on 07/30/16.

## 2016-07-30 ENCOUNTER — Other Ambulatory Visit
Admission: RE | Admit: 2016-07-30 | Discharge: 2016-07-30 | Disposition: A | Discharge status: Home / Self Care | Payer: Medicare Other | Source: Ambulatory Visit | Attending: Internal Medicine | Admitting: Internal Medicine

## 2016-07-30 DIAGNOSIS — C160 Malignant neoplasm of cardia: Secondary | ICD-10-CM | POA: Diagnosis present

## 2016-07-30 DIAGNOSIS — D696 Thrombocytopenia, unspecified: Secondary | ICD-10-CM | POA: Insufficient documentation

## 2016-07-30 LAB — CBC WITH DIFFERENTIAL/PLATELET
BASOS ABS: 0.1 10*3/uL (ref 0–0.1)
BASOS PCT: 1 %
EOS PCT: 1 %
Eosinophils Absolute: 0 10*3/uL (ref 0–0.7)
HEMATOCRIT: 35.5 % (ref 35.0–47.0)
Hemoglobin: 11.9 g/dL — ABNORMAL LOW (ref 12.0–16.0)
Lymphocytes Relative: 21 %
Lymphs Abs: 1.9 10*3/uL (ref 1.0–3.6)
MCH: 29.6 pg (ref 26.0–34.0)
MCHC: 33.5 g/dL (ref 32.0–36.0)
MCV: 88.2 fL (ref 80.0–100.0)
MONO ABS: 0.5 10*3/uL (ref 0.2–0.9)
MONOS PCT: 6 %
NEUTROS ABS: 6.5 10*3/uL (ref 1.4–6.5)
Neutrophils Relative %: 71 %
PLATELETS: 106 10*3/uL — AB (ref 150–440)
RBC: 4.02 MIL/uL (ref 3.80–5.20)
RDW: 13.9 % (ref 11.5–14.5)
WBC: 9 10*3/uL (ref 3.6–11.0)

## 2016-08-01 ENCOUNTER — Telehealth: Payer: Self-pay | Admitting: General Surgery

## 2016-08-01 NOTE — Telephone Encounter (Signed)
PATIENT CALLED TO LET DR Jamal Collin KNOW SHE HAS BEEN EXPOSED TO SHINGLES. SHE DOES NOT HAVE ANY SYMPTOMS & A COUPLE OF YEARS AGE SHE A SHOT FOR THE SHINGLES.SHE IS CURRENTLY SCHEDULED FOR SURGERY ON Friday 08-01-16.PLEASE ADVISE.

## 2016-08-01 NOTE — Telephone Encounter (Signed)
Patient called back and was notified that being exposed to the shingles should not affect her surgery scheduled for the end of the week per Dr. Jamal Collin.   This patient verbalizes understanding.

## 2016-08-02 MED ORDER — CEFAZOLIN SODIUM-DEXTROSE 2-4 GM/100ML-% IV SOLN
2.0000 g | INTRAVENOUS | Status: AC
  Administered 2016-08-03: 2 g via INTRAVENOUS

## 2016-08-03 ENCOUNTER — Encounter: Payer: Self-pay | Admitting: Anesthesiology

## 2016-08-03 ENCOUNTER — Inpatient Hospital Stay
Admission: RE | Admit: 2016-08-03 | Discharge: 2016-08-08 | DRG: 328 | Disposition: A | Discharge status: Home / Self Care | Payer: Medicare Other | Source: Ambulatory Visit | Attending: General Surgery | Admitting: General Surgery

## 2016-08-03 ENCOUNTER — Inpatient Hospital Stay: Payer: Medicare Other | Admitting: Anesthesiology

## 2016-08-03 ENCOUNTER — Encounter
Admission: RE | Disposition: A | Discharge status: Home / Self Care | Payer: Self-pay | Source: Ambulatory Visit | Attending: General Surgery

## 2016-08-03 DIAGNOSIS — C162 Malignant neoplasm of body of stomach: Principal | ICD-10-CM | POA: Diagnosis present

## 2016-08-03 DIAGNOSIS — C160 Malignant neoplasm of cardia: Secondary | ICD-10-CM

## 2016-08-03 DIAGNOSIS — E1151 Type 2 diabetes mellitus with diabetic peripheral angiopathy without gangrene: Secondary | ICD-10-CM | POA: Diagnosis present

## 2016-08-03 DIAGNOSIS — K219 Gastro-esophageal reflux disease without esophagitis: Secondary | ICD-10-CM | POA: Diagnosis present

## 2016-08-03 DIAGNOSIS — I2510 Atherosclerotic heart disease of native coronary artery without angina pectoris: Secondary | ICD-10-CM | POA: Diagnosis present

## 2016-08-03 DIAGNOSIS — Z7984 Long term (current) use of oral hypoglycemic drugs: Secondary | ICD-10-CM | POA: Diagnosis not present

## 2016-08-03 DIAGNOSIS — D002 Carcinoma in situ of stomach: Secondary | ICD-10-CM | POA: Diagnosis present

## 2016-08-03 DIAGNOSIS — Z79899 Other long term (current) drug therapy: Secondary | ICD-10-CM

## 2016-08-03 DIAGNOSIS — D696 Thrombocytopenia, unspecified: Secondary | ICD-10-CM | POA: Diagnosis present

## 2016-08-03 DIAGNOSIS — Z85828 Personal history of other malignant neoplasm of skin: Secondary | ICD-10-CM

## 2016-08-03 DIAGNOSIS — I10 Essential (primary) hypertension: Secondary | ICD-10-CM | POA: Diagnosis present

## 2016-08-03 DIAGNOSIS — Z7982 Long term (current) use of aspirin: Secondary | ICD-10-CM

## 2016-08-03 DIAGNOSIS — Z85118 Personal history of other malignant neoplasm of bronchus and lung: Secondary | ICD-10-CM

## 2016-08-03 DIAGNOSIS — M810 Age-related osteoporosis without current pathological fracture: Secondary | ICD-10-CM | POA: Diagnosis present

## 2016-08-03 HISTORY — PX: PARTIAL GASTRECTOMY: SHX6003

## 2016-08-03 HISTORY — DX: Carcinoma in situ of stomach: D00.2

## 2016-08-03 HISTORY — PX: UPPER GI ENDOSCOPY: SHX6162

## 2016-08-03 LAB — CBC
HEMATOCRIT: 31.5 % — AB (ref 35.0–47.0)
HEMOGLOBIN: 11.1 g/dL — AB (ref 12.0–16.0)
MCH: 31 pg (ref 26.0–34.0)
MCHC: 35.3 g/dL (ref 32.0–36.0)
MCV: 87.7 fL (ref 80.0–100.0)
Platelets: 84 10*3/uL — ABNORMAL LOW (ref 150–440)
RBC: 3.59 MIL/uL — ABNORMAL LOW (ref 3.80–5.20)
RDW: 13.9 % (ref 11.5–14.5)
WBC: 8.3 10*3/uL (ref 3.6–11.0)

## 2016-08-03 LAB — GLUCOSE, CAPILLARY
GLUCOSE-CAPILLARY: 103 mg/dL — AB (ref 65–99)
Glucose-Capillary: 161 mg/dL — ABNORMAL HIGH (ref 65–99)

## 2016-08-03 LAB — CREATININE, SERUM
CREATININE: 1.3 mg/dL — AB (ref 0.44–1.00)
GFR calc Af Amer: 44 mL/min — ABNORMAL LOW (ref >60)
GFR calc non Af Amer: 38 mL/min — ABNORMAL LOW (ref >60)

## 2016-08-03 SURGERY — GASTRECTOMY, PARTIAL
Anesthesia: General | Wound class: Clean Contaminated

## 2016-08-03 MED ORDER — ACETAMINOPHEN 10 MG/ML IV SOLN
1000.0000 mg | Freq: Four times a day (QID) | INTRAVENOUS | Status: AC
Start: 2016-08-03 — End: 2016-08-04
  Administered 2016-08-03 – 2016-08-04 (×4): 1000 mg via INTRAVENOUS
  Filled 2016-08-03 (×4): qty 100

## 2016-08-03 MED ORDER — SUGAMMADEX SODIUM 200 MG/2ML IV SOLN
INTRAVENOUS | Status: DC | PRN
  Administered 2016-08-03: 120 mg via INTRAVENOUS

## 2016-08-03 MED ORDER — ROCURONIUM BROMIDE 100 MG/10ML IV SOLN
INTRAVENOUS | Status: DC | PRN
  Administered 2016-08-03 (×2): 10 mg via INTRAVENOUS
  Administered 2016-08-03: 5 mg via INTRAVENOUS
  Administered 2016-08-03: 45 mg via INTRAVENOUS
  Administered 2016-08-03: 10 mg via INTRAVENOUS

## 2016-08-03 MED ORDER — ACETAMINOPHEN 10 MG/ML IV SOLN
INTRAVENOUS | Status: AC
  Filled 2016-08-03: qty 100

## 2016-08-03 MED ORDER — ACETAMINOPHEN 10 MG/ML IV SOLN
INTRAVENOUS | Status: DC | PRN
Start: 2016-08-03 — End: 2016-08-03
  Administered 2016-08-03: 1000 mg via INTRAVENOUS

## 2016-08-03 MED ORDER — SUCCINYLCHOLINE CHLORIDE 20 MG/ML IJ SOLN
INTRAMUSCULAR | Status: AC
  Filled 2016-08-03: qty 1

## 2016-08-03 MED ORDER — HYDROMORPHONE HCL 1 MG/ML IJ SOLN
0.2500 mg | INTRAMUSCULAR | Status: DC | PRN
  Administered 2016-08-03: 0.25 mg via INTRAVENOUS
  Administered 2016-08-03 (×2): 0.5 mg via INTRAVENOUS
  Administered 2016-08-03: 0.25 mg via INTRAVENOUS
  Administered 2016-08-03: 0.5 mg via INTRAVENOUS

## 2016-08-03 MED ORDER — SODIUM CHLORIDE 0.45 % IV SOLN
INTRAVENOUS | Status: DC
  Administered 2016-08-03 – 2016-08-06 (×8): via INTRAVENOUS

## 2016-08-03 MED ORDER — CEFAZOLIN SODIUM-DEXTROSE 2-4 GM/100ML-% IV SOLN
INTRAVENOUS | Status: AC
  Filled 2016-08-03: qty 100

## 2016-08-03 MED ORDER — PROPOFOL 10 MG/ML IV BOLUS
INTRAVENOUS | Status: DC | PRN
  Administered 2016-08-03: 110 mg via INTRAVENOUS

## 2016-08-03 MED ORDER — CHLORHEXIDINE GLUCONATE CLOTH 2 % EX PADS: 6.0000 | MEDICATED_PAD | Freq: Once | CUTANEOUS | Status: DC

## 2016-08-03 MED ORDER — PANTOPRAZOLE SODIUM 40 MG IV SOLR
40.0000 mg | Freq: Every day | INTRAVENOUS | Status: DC
  Administered 2016-08-03 – 2016-08-07 (×5): 40 mg via INTRAVENOUS
  Filled 2016-08-03 (×5): qty 40

## 2016-08-03 MED ORDER — ONDANSETRON HCL 4 MG/2ML IJ SOLN: 4.0000 mg | Freq: Once | INTRAMUSCULAR | Status: DC | PRN

## 2016-08-03 MED ORDER — ROCURONIUM BROMIDE 100 MG/10ML IV SOLN
INTRAVENOUS | Status: AC
  Filled 2016-08-03: qty 1

## 2016-08-03 MED ORDER — SODIUM CHLORIDE 0.9 % IJ SOLN
INTRAMUSCULAR | Status: AC
  Filled 2016-08-03: qty 50

## 2016-08-03 MED ORDER — HYDROMORPHONE HCL 1 MG/ML IJ SOLN
INTRAMUSCULAR | Status: AC
  Administered 2016-08-03: 0.5 mg via INTRAVENOUS
  Filled 2016-08-03: qty 1

## 2016-08-03 MED ORDER — FENTANYL CITRATE (PF) 100 MCG/2ML IJ SOLN
INTRAMUSCULAR | Status: AC
  Filled 2016-08-03: qty 2

## 2016-08-03 MED ORDER — ENOXAPARIN SODIUM 30 MG/0.3ML ~~LOC~~ SOLN
30.0000 mg | SUBCUTANEOUS | Status: DC
  Administered 2016-08-04 – 2016-08-07 (×4): 30 mg via SUBCUTANEOUS
  Filled 2016-08-03 (×4): qty 0.3

## 2016-08-03 MED ORDER — FENTANYL CITRATE (PF) 250 MCG/5ML IJ SOLN
INTRAMUSCULAR | Status: AC
  Filled 2016-08-03: qty 5

## 2016-08-03 MED ORDER — PROPOFOL 10 MG/ML IV BOLUS
INTRAVENOUS | Status: AC
  Filled 2016-08-03: qty 20

## 2016-08-03 MED ORDER — FENTANYL CITRATE (PF) 100 MCG/2ML IJ SOLN
INTRAMUSCULAR | Status: DC | PRN
  Administered 2016-08-03: 50 ug via INTRAVENOUS
  Administered 2016-08-03: 150 ug via INTRAVENOUS
  Administered 2016-08-03 (×2): 50 ug via INTRAVENOUS

## 2016-08-03 MED ORDER — ALVIMOPAN 12 MG PO CAPS
ORAL_CAPSULE | ORAL | Status: AC
  Filled 2016-08-03: qty 1

## 2016-08-03 MED ORDER — SODIUM CHLORIDE 0.9 % IV SOLN
INTRAVENOUS | Status: DC
  Administered 2016-08-03: 08:00:00 via INTRAVENOUS

## 2016-08-03 MED ORDER — FENTANYL CITRATE (PF) 100 MCG/2ML IJ SOLN
25.0000 ug | INTRAMUSCULAR | Status: AC | PRN
  Administered 2016-08-03 (×6): 25 ug via INTRAVENOUS

## 2016-08-03 MED ORDER — HYDROMORPHONE HCL 1 MG/ML IJ SOLN
INTRAMUSCULAR | Status: AC
  Administered 2016-08-03: 0.25 mg via INTRAVENOUS
  Filled 2016-08-03: qty 1

## 2016-08-03 MED ORDER — PHENYLEPHRINE HCL 10 MG/ML IJ SOLN
INTRAMUSCULAR | Status: AC
  Filled 2016-08-03: qty 1

## 2016-08-03 MED ORDER — ONDANSETRON HCL 4 MG/2ML IJ SOLN
INTRAMUSCULAR | Status: DC | PRN
Start: 2016-08-03 — End: 2016-08-03
  Administered 2016-08-03: 4 mg via INTRAVENOUS

## 2016-08-03 MED ORDER — ONDANSETRON 4 MG PO TBDP: 4.0000 mg | ORAL_TABLET | Freq: Four times a day (QID) | ORAL | Status: DC | PRN

## 2016-08-03 MED ORDER — SUCCINYLCHOLINE CHLORIDE 20 MG/ML IJ SOLN
INTRAMUSCULAR | Status: DC | PRN
  Administered 2016-08-03: 80 mg via INTRAVENOUS

## 2016-08-03 MED ORDER — ALVIMOPAN 12 MG PO CAPS
12.0000 mg | ORAL_CAPSULE | Freq: Once | ORAL | Status: AC
  Administered 2016-08-03: 12 mg via ORAL

## 2016-08-03 MED ORDER — HYDRALAZINE HCL 20 MG/ML IJ SOLN: 10.0000 mg | INTRAMUSCULAR | Status: DC | PRN

## 2016-08-03 MED ORDER — ONDANSETRON HCL 4 MG/2ML IJ SOLN
INTRAMUSCULAR | Status: AC
  Filled 2016-08-03: qty 2

## 2016-08-03 MED ORDER — FENTANYL CITRATE (PF) 100 MCG/2ML IJ SOLN
INTRAMUSCULAR | Status: AC
  Administered 2016-08-03: 25 ug via INTRAVENOUS
  Filled 2016-08-03: qty 2

## 2016-08-03 MED ORDER — MORPHINE SULFATE (PF) 2 MG/ML IV SOLN
2.0000 mg | INTRAVENOUS | Status: DC | PRN
  Administered 2016-08-03 – 2016-08-06 (×10): 2 mg via INTRAVENOUS
  Filled 2016-08-03 (×11): qty 1

## 2016-08-03 MED ORDER — ONDANSETRON HCL 4 MG/2ML IJ SOLN
4.0000 mg | Freq: Four times a day (QID) | INTRAMUSCULAR | Status: DC | PRN
  Administered 2016-08-04: 4 mg via INTRAVENOUS
  Filled 2016-08-03: qty 2

## 2016-08-03 MED ORDER — SUGAMMADEX SODIUM 200 MG/2ML IV SOLN
INTRAVENOUS | Status: AC
  Filled 2016-08-03: qty 2

## 2016-08-03 MED ORDER — METHYLENE BLUE 0.5 % INJ SOLN
INTRAVENOUS | Status: AC
  Filled 2016-08-03: qty 10

## 2016-08-03 SURGICAL SUPPLY — 32 items
BLADE SURG 10 STRL SS SAFETY (BLADE) ×3 IMPLANT
CANISTER SUCT 1200ML W/VALVE (MISCELLANEOUS) ×3 IMPLANT
CATH TRAY 16F METER LATEX (MISCELLANEOUS) ×3 IMPLANT
CHLORAPREP W/TINT 26ML (MISCELLANEOUS) ×3 IMPLANT
DRAPE LAPAROTOMY 100X77 ABD (DRAPES) ×3 IMPLANT
DRSG OPSITE POSTOP 4X8 (GAUZE/BANDAGES/DRESSINGS) ×3 IMPLANT
ELECT REM PT RETURN 9FT ADLT (ELECTROSURGICAL) ×3
ELECTRODE REM PT RTRN 9FT ADLT (ELECTROSURGICAL) ×1 IMPLANT
GLOVE BIO SURGEON STRL SZ7 (GLOVE) ×24 IMPLANT
GOWN STRL REUS W/ TWL LRG LVL3 (GOWN DISPOSABLE) ×4 IMPLANT
GOWN STRL REUS W/TWL LRG LVL3 (GOWN DISPOSABLE) ×8
KIT RM TURNOVER STRD PROC AR (KITS) ×3 IMPLANT
LABEL OR SOLS (LABEL) ×3 IMPLANT
NS IRRIG 1000ML POUR BTL (IV SOLUTION) ×3 IMPLANT
PACK BASIN MAJOR ARMC (MISCELLANEOUS) ×3 IMPLANT
RELOAD STAPLER LINE PROX 60 GR (STAPLE) ×1 IMPLANT
SHEARS HARMONIC 23CM COAG (MISCELLANEOUS) ×3 IMPLANT
STAPLER PROX 25M (MISCELLANEOUS) ×3 IMPLANT
STAPLER PROXIMATE (STAPLE) ×3 IMPLANT
STAPLER RELOAD LINE PROX 60 GR (STAPLE) ×3
STAPLER SKIN PROX 35W (STAPLE) ×3 IMPLANT
STAPLER SYS INTERNAL RELOAD SS (MISCELLANEOUS) ×3 IMPLANT
SUT PROLENE 0 CT 1 30 (SUTURE) ×9 IMPLANT
SUT SILK 3-0 (SUTURE) ×12 IMPLANT
SUT VIC AB 2-0 BRD 54 (SUTURE) IMPLANT
SUT VIC AB 2-0 CT1 27 (SUTURE) ×2
SUT VIC AB 2-0 CT1 TAPERPNT 27 (SUTURE) ×1 IMPLANT
SUT VIC AB 3-0 54X BRD REEL (SUTURE) ×1 IMPLANT
SUT VIC AB 3-0 BRD 54 (SUTURE) ×2
SUT VIC AB 3-0 SH 27 (SUTURE)
SUT VIC AB 3-0 SH 27X BRD (SUTURE) IMPLANT
SYR BULB IRRIG 60ML STRL (SYRINGE) ×3 IMPLANT

## 2016-08-03 NOTE — Anesthesia Post-op Follow-up Note (Cosign Needed)
Anesthesia QCDR form completed.        

## 2016-08-03 NOTE — Interval H&P Note (Signed)
History and Physical Interval Note:  08/03/2016 7:46 AM  Stacy Cook  has presented today for surgery, with the diagnosis of cancer stomach  The various methods of treatment have been discussed with the patient and family. After consideration of risks, benefits and other options for treatment, the patient has consented to  Procedure(s): PARTIAL GASTRECTOMY (N/A) UPPER  ENDOSCOPY (N/A) as a surgical intervention .  The patient's history has been reviewed, patient examined, no change in status, stable for surgery.  I have reviewed the patient's chart and labs.  Questions were answered to the patient's satisfaction.     [name]

## 2016-08-03 NOTE — Transfer of Care (Signed)
Immediate Anesthesia Transfer of Care Note  Patient: Stacy Cook  Procedure(s) Performed: Procedure(s): PARTIAL GASTRECTOMY (N/A) UPPER  ENDOSCOPY (N/A)  Patient Location: PACU  Anesthesia Type:General  Level of Consciousness: sedated and responds to stimulation  Airway & Oxygen Therapy: Patient Spontanous Breathing and Patient connected to face mask oxygen  Post-op Assessment: Report given to RN and Post -op Vital signs reviewed and stable  Post vital signs: Reviewed and stable  Last Vitals:  Vitals:   08/03/16 0641 08/03/16 1019  BP: 137/73 (!) 188/49  Pulse: 78 78  Resp: 16 (!) 9  Temp: 36.6 C 36.8 C    Last Pain:  Vitals:   08/03/16 0641  TempSrc: Oral         Complications: No apparent anesthesia complications

## 2016-08-03 NOTE — H&P (View-Only) (Signed)
Patient ID: Stacy Cook, female   DOB: 1935-09-25, 81 y.o.   MRN: [id number]  Chief Complaint  Patient presents with  . Other    Discuss surgery    HPI Stacy Cook is a 81 y.o. female here today to discuss surgery options.Patient states she is doing well. Moves her bowels daily. She was diagnosed with a very small adenocarcinoma in stomach. HPI  Past Medical History:  Diagnosis Date  . Anemia   . Arthritis    back  . Basal cell carcinoma of skin 2016  . Cancer of right lung ([place]) 05/09/2015   Dr. Genevive Bi performed Right lower lobe lobectomy.   . Cataract   . Diabetes mellitus without complication ([name])   . Hypertension   . Murmur   . Osteoporosis   . Stomach tumor (benign)     Past Surgical History:  Procedure Laterality Date  . ABDOMINAL HYSTERECTOMY    . APPENDECTOMY    . BREAST BIOPSY Left 05/09/2016   path pending  . ESOPHAGOGASTRODUODENOSCOPY (EGD) WITH PROPOFOL N/A 03/27/2016   Procedure: ESOPHAGOGASTRODUODENOSCOPY (EGD) WITH PROPOFOL;  Surgeon: Lollie Sails, MD;  Location[facility] ENDOSCOPY;  Service: Endoscopy;  Laterality: N/A;  . ESOPHAGOGASTRODUODENOSCOPY (EGD) WITH PROPOFOL N/A 05/28/2016   Procedure: ESOPHAGOGASTRODUODENOSCOPY (EGD) WITH PROPOFOL;  Surgeon: Lollie Sails, MD;  Location: [facility] ENDOSCOPY;  Service: Endoscopy;  Laterality: N/A;  . SHOULDER ARTHROSCOPY W/ CAPSULAR REPAIR Right   . THORACOTOMY Right 05/09/2015   Procedure: THORACOTOMY, RIGHT LOWER LOBECTOMY, BRONCHOSCOPY;  Surgeon: Nestor Lewandowsky, MD;  Location: [facility] ORS;  Service: Thoracic;  Laterality: Right;    Family History  Problem Relation Age of Onset  . Diabetes Other   . Breast cancer Neg Hx     Social History Social History  Substance Use Topics  . Smoking status: Never Smoker  . Smokeless tobacco: Never Used  . Alcohol use No    Allergies  Allergen Reactions  . Statins     Joint pain  . Sulfa Antibiotics Nausea And Vomiting  . Pneumococcal Vaccine Itching    Hives and  fever    Current Outpatient Prescriptions  Medication Sig Dispense Refill  . aspirin 81 MG tablet Take 81 mg by mouth daily. Reported on 06/03/2015    . cholecalciferol (VITAMIN D) 1000 UNITS tablet Take 1,000 Units by mouth daily.    . cyanocobalamin (V-R VITAMIN B-12) 500 MCG tablet Take by mouth.    Marland Kitchen HYDROcodone-acetaminophen (NORCO) 7.5-325 MG tablet Take 1-2 tablets by mouth every 4 (four) hours as needed for moderate pain or severe pain. 60 tablet 0  . losartan (COZAAR) 100 MG tablet Take 100 mg by mouth.    . metFORMIN (GLUCOPHAGE) 500 MG tablet Take 500 mg by mouth 2 (two) times daily with a meal.    . metoprolol succinate (TOPROL-XL) 25 MG 24 hr tablet Take 25 mg by mouth daily.     . Omega-3 Fatty Acids (FISH OIL) 1000 MG CAPS Take by mouth.    . Probiotic Product (PROBIOTIC DAILY PO) Take by mouth.    . RABEprazole (ACIPHEX) 20 MG tablet Take 20 mg by mouth 2 (two) times daily.     . predniSONE (DELTASONE) 20 MG tablet Take 2 pills once a day with food x AM; do not stop until directed. 60 tablet 0   No current facility-administered medications for this visit.     Review of Systems Review of Systems  Constitutional: Negative.   Respiratory: Negative.   Cardiovascular: Negative.  Gastrointestinal: Negative.     Blood pressure 140/72, pulse 66, resp. rate 13, height '5\' 4"'$  (1.626 m), weight 125 lb (56.7 kg).  Physical Exam Physical Exam  Constitutional: She is oriented to person, place, and time. She appears well-developed and well-nourished.  Eyes: Conjunctivae are normal. No scleral icterus.  Neck: Neck supple.  Cardiovascular: Normal rate and regular rhythm.   Murmur heard.  Systolic murmur is present with a grade of 4/6  Pulmonary/Chest: Effort normal and breath sounds normal.  Abdominal: Soft. Bowel sounds are normal. There is no hepatomegaly. There is no tenderness. No hernia.  Lymphadenopathy:    She has no cervical adenopathy.    She has no axillary  adenopathy.  Neurological: She is alert and oriented to person, place, and time.  Skin: Skin is warm and dry.    Data Reviewed Prior notes and labs reviewed   Assessment    Carcinoma stomach- focal at incisura. Path showed signet ring feature. Imaging with no apparent mass/nodes.  She is a candidate for partial gastrectomy. Procedure, risks and benefits explained to pt and she is agreeable.  Plan    Patient to be scheduled for a partial gastrectomy with upper endoscopy (to be completed in the O.R at time of surgery).    Patient's surgery has been scheduled for 08-03-16 at [facility]. It is okay for patient to continue 81 mg aspirin once daily.   This information has been scribed by Gaspar Cola CMA.   SANKAR,SEEPLAPUTHUR G 07/06/2016, 2:43 PM

## 2016-08-03 NOTE — Anesthesia Preprocedure Evaluation (Addendum)
Anesthesia Evaluation  Patient identified by MRN, date of birth, ID band Patient awake    Reviewed: Allergy & Precautions, NPO status , Patient's Chart, lab work & pertinent test results, reviewed documented beta blocker date and time   Airway Mallampati: II  TM Distance: >3 FB     Dental  (+) Chipped, Missing   Pulmonary           Cardiovascular hypertension, Pt. on medications and Pt. on home beta blockers + CAD and + Peripheral Vascular Disease  + Valvular Problems/Murmurs      Neuro/Psych  Neuromuscular disease    GI/Hepatic GERD  Controlled,  Endo/Other  diabetes, Type 2  Renal/GU      Musculoskeletal  (+) Arthritis ,   Abdominal   Peds  Hematology  (+) anemia ,   Anesthesia Other Findings Hx lung ca. No change in EKG, no cardiac symptoms.  Reproductive/Obstetrics                           Anesthesia Physical Anesthesia Plan  ASA: III  Anesthesia Plan: General   Post-op Pain Management:    Induction: Intravenous  Airway Management Planned: Oral ETT  Additional Equipment:   Intra-op Plan:   Post-operative Plan:   Informed Consent: I have reviewed the patients History and Physical, chart, labs and discussed the procedure including the risks, benefits and alternatives for the proposed anesthesia with the patient or authorized representative who has indicated his/her understanding and acceptance.     Plan Discussed with: CRNA  Anesthesia Plan Comments:         Anesthesia Quick Evaluation

## 2016-08-03 NOTE — Op Note (Signed)
Preop diagnosis: Carcinoma stomach  Post op diagnosis: Same  Operation: Partial gastrectomy with Billroth I anastomosis, upper endoscopy with instillation of methylene blue  Surgeon: Mckinley Jewel  Assistant: Hervey Ard   Anesthesia: Gen.  Complications: None  EBL: Less than 50 mL  Drains: None  Description: This patient was diagnosed how with the segment draining type adenocarcinoma fairly focal and very small located near the incisura of the stomach there was no evidence of any metastatic disease and after the initial biopsy she was observed for 2-3 months. Repeat endoscopy revealed a same minimal finding and given this was felt that a reasonable to consider a surgical resection. A CT done preoperative rate did not reveal any evidence of metastatic disease and such enlarged lymph nodes or distant metastases. Patient was first put to sleep with an endotracheal tube. Upper endoscopy was then completed and a tiny focus where the Inc. was placed was noted along the anterior surface of the stomach near the incisura with what appeared to be a very tiny mucosal abnormality just a little distal to this. To allow for possibly identifying a sentinel node 8 mL of diluted methylene blue was injected in the submucosal region close to the area of previous inking. Endoscopy was then completed. Foley catheter was inserted and the abdomen was prepped and draped as sterile field. Timeout was. Vertical upper midline incision from the xiphoid the down towards the umbilicus was made and deepened through the layers the abdominal wall. Bleeding was controlled cautery. The peritoneal cavity was entered into and retractors were placed to expose the stomach satisfactorily. First the greater curvature was freed with the use of the harmonic device up to the level well past the inked area and the recent methylene blue injected site in the distal stomach. Following this a portion of the lesser omentum was included in  the lateral in the tissue and the stomach to be resected. And this was taken down also with the harmonic device the proximal 3 branches supplying the stomach were left intact. Following this the proximal stomach was then transected after application of a TA 90 stapling device. With the stomach distal stomach mouth and retracted the towards the right side the posterior aspect of the duodenal region was dissected free. A pursestring device was then applied in the duodenum and this was then amputated just past the pylorus. An end to end anastomosis was planned. The staple line at the lower end of the proximal stomach was reopened after sizing the 2 lm a 25 EEA was selected. The end of this was then placed in the duodenum and tied down the pursestring. The proximal stapling device was placed through the opening in the stomach through the posterior wall of the stomach and then approximated in anastomosis completed. 2 complete rings were obtained. The opening the stomach was then closed with a TA 60 stapler. Along the staple line of the stomach multiple sutures of 3-0 silk were used to reinforce the staple line. 3 3-0 silk stitches were placed at the anastomosis of the stomach to the duodenum to reinforce the area also. Area was irrigated with saline and suctioned out. No overtly identified lymph nodes or metastatic disease was noted in the abdomen. The peritoneum was closed with running 2-0 Vicryl. Fascia was closed with interrupted figure-of-eight stitches of 0 proline. Skin was approximated with staples. A honeycomb dressing was placed. Patient subsequently extubated and returned recovery room stable condition

## 2016-08-03 NOTE — Anesthesia Procedure Notes (Signed)
Procedure Name: Intubation Performed by: Lance Muss Pre-anesthesia Checklist: Patient identified, Patient being monitored, Timeout performed, Emergency Drugs available and Suction available Patient Re-evaluated:Patient Re-evaluated prior to inductionOxygen Delivery Method: Circle system utilized Preoxygenation: Pre-oxygenation with 100% oxygen Intubation Type: IV induction Ventilation: Mask ventilation without difficulty Laryngoscope Size: McGraph and 4 Grade View: Grade II Tube type: Oral Tube size: 7.0 mm Number of attempts: 1 Airway Equipment and Method: Stylet and Video-laryngoscopy Placement Confirmation: ETT inserted through vocal cords under direct vision,  positive ETCO2 and breath sounds checked- equal and bilateral Secured at: 21 cm Tube secured with: Tape Dental Injury: Teeth and Oropharynx as per pre-operative assessment  Difficulty Due To: Difficult Airway- due to anterior larynx Future Recommendations: Recommend- induction with short-acting agent, and alternative techniques readily available

## 2016-08-03 NOTE — OR Nursing (Signed)
Will start abx in OR per Beryle Quant, RN

## 2016-08-03 NOTE — Anesthesia Postprocedure Evaluation (Signed)
Anesthesia Post Note  Patient: Stacy Cook  Procedure(s) Performed: Procedure(s) (LRB): PARTIAL GASTRECTOMY (N/A) UPPER  ENDOSCOPY (N/A)  Patient location during evaluation: PACU Anesthesia Type: General Level of consciousness: awake and alert Pain management: pain level controlled Vital Signs Assessment: post-procedure vital signs reviewed and stable Respiratory status: spontaneous breathing, nonlabored ventilation, respiratory function stable and patient connected to nasal cannula oxygen Cardiovascular status: blood pressure returned to baseline and stable Postop Assessment: no signs of nausea or vomiting Anesthetic complications: no     Last Vitals:  Vitals:   08/03/16 1201 08/03/16 1444  BP: (!) 150/61 (!) 148/44  Pulse: 80 72  Resp: 20 20  Temp: 36.5 C 36.6 C    Last Pain:  Vitals:   08/03/16 1444  TempSrc: Oral  PainSc:                  Elenna Spratling S

## 2016-08-04 LAB — CBC
HEMATOCRIT: 30.1 % — AB (ref 35.0–47.0)
HEMOGLOBIN: 10.4 g/dL — AB (ref 12.0–16.0)
MCH: 30.4 pg (ref 26.0–34.0)
MCHC: 34.5 g/dL (ref 32.0–36.0)
MCV: 87.9 fL (ref 80.0–100.0)
Platelets: 79 10*3/uL — ABNORMAL LOW (ref 150–440)
RBC: 3.43 MIL/uL — ABNORMAL LOW (ref 3.80–5.20)
RDW: 13.9 % (ref 11.5–14.5)
WBC: 6.1 10*3/uL (ref 3.6–11.0)

## 2016-08-04 LAB — BASIC METABOLIC PANEL
ANION GAP: 4 — AB (ref 5–15)
BUN: 20 mg/dL (ref 6–20)
CALCIUM: 7.9 mg/dL — AB (ref 8.9–10.3)
CO2: 26 mmol/L (ref 22–32)
Chloride: 103 mmol/L (ref 101–111)
Creatinine, Ser: 1.09 mg/dL — ABNORMAL HIGH (ref 0.44–1.00)
GFR calc Af Amer: 55 mL/min — ABNORMAL LOW (ref >60)
GFR calc non Af Amer: 47 mL/min — ABNORMAL LOW (ref >60)
GLUCOSE: 103 mg/dL — AB (ref 65–99)
POTASSIUM: 4.3 mmol/L (ref 3.5–5.1)
Sodium: 133 mmol/L — ABNORMAL LOW (ref 135–145)

## 2016-08-04 NOTE — Progress Notes (Signed)
Patient ID: Stacy Cook, female   DOB: 1935/08/09, 81 y.o.   MRN: [id number] No complaints on for some incisional discomfort. NG tube intact and drained approximately 200 mL since surgery. Urine output is adequate Labs-platelets are down to 77,000, rest are stable Abdomen is soft with good bowel sounds and incision is clean. Lungs are clear Heart in sinus rhythm. BP and heart rate are stable Overall doing well. Does patient to sit up and ambulating more today. We'll monitor platelets daily.

## 2016-08-04 NOTE — Plan of Care (Signed)
Problem: Activity: Goal: Risk for activity intolerance will decrease Outcome: Progressing Pt ambulated 160 ft. In the hall today and expressed desire to do so again before bed.  Dola Argyle, RN

## 2016-08-05 LAB — CBC WITH DIFFERENTIAL/PLATELET
BASOS PCT: 1 %
Basophils Absolute: 0 10*3/uL (ref 0–0.1)
EOS ABS: 0.1 10*3/uL (ref 0–0.7)
Eosinophils Relative: 2 %
HCT: 29.9 % — ABNORMAL LOW (ref 35.0–47.0)
HEMOGLOBIN: 10.5 g/dL — AB (ref 12.0–16.0)
Lymphocytes Relative: 14 %
Lymphs Abs: 0.8 10*3/uL — ABNORMAL LOW (ref 1.0–3.6)
MCH: 30.7 pg (ref 26.0–34.0)
MCHC: 35 g/dL (ref 32.0–36.0)
MCV: 87.7 fL (ref 80.0–100.0)
MONO ABS: 0.3 10*3/uL (ref 0.2–0.9)
MONOS PCT: 6 %
Neutro Abs: 4.5 10*3/uL (ref 1.4–6.5)
Neutrophils Relative %: 77 %
PLATELETS: 80 10*3/uL — AB (ref 150–440)
RBC: 3.41 MIL/uL — AB (ref 3.80–5.20)
RDW: 13.6 % (ref 11.5–14.5)
WBC: 5.8 10*3/uL (ref 3.6–11.0)

## 2016-08-05 LAB — BASIC METABOLIC PANEL
ANION GAP: 6 (ref 5–15)
BUN: 17 mg/dL (ref 6–20)
CALCIUM: 7.9 mg/dL — AB (ref 8.9–10.3)
CO2: 24 mmol/L (ref 22–32)
Chloride: 100 mmol/L — ABNORMAL LOW (ref 101–111)
Creatinine, Ser: 0.93 mg/dL (ref 0.44–1.00)
GFR, EST NON AFRICAN AMERICAN: 57 mL/min — AB (ref >60)
Glucose, Bld: 87 mg/dL (ref 65–99)
Potassium: 4.2 mmol/L (ref 3.5–5.1)
SODIUM: 130 mmol/L — AB (ref 135–145)

## 2016-08-05 NOTE — Progress Notes (Signed)
Patient ID: Stacy Cook, female   DOB: 1936-02-20, 81 y.o.   MRN: [id number] No complaints. She is afebrile and vital signs are stable NG tube with about 200 mL last 24 hours Good urine output Platelets stable at 80,000 Abdomen is soft, incisions clean,bowel sounds are active Lungs are clear We'll start with clear liquids in small quantities today and it well-tolerated. Remove NG tube tomorrow. DC Foley today

## 2016-08-06 MED ORDER — TRAMADOL HCL 50 MG PO TABS
50.0000 mg | ORAL_TABLET | Freq: Four times a day (QID) | ORAL | Status: DC
  Administered 2016-08-06 – 2016-08-07 (×5): 50 mg via ORAL
  Filled 2016-08-06 (×5): qty 1

## 2016-08-06 MED ORDER — CYANOCOBALAMIN 500 MCG PO TABS
500.0000 ug | ORAL_TABLET | Freq: Every day | ORAL | Status: DC
  Administered 2016-08-06 – 2016-08-08 (×3): 500 ug via ORAL
  Filled 2016-08-06 (×3): qty 1

## 2016-08-06 MED ORDER — METOPROLOL SUCCINATE ER 25 MG PO TB24
25.0000 mg | ORAL_TABLET | Freq: Every day | ORAL | Status: DC
  Administered 2016-08-06 – 2016-08-08 (×3): 25 mg via ORAL
  Filled 2016-08-06 (×3): qty 1

## 2016-08-06 NOTE — Progress Notes (Signed)
Patient ID: Stacy Cook, female   DOB: [birth year], 81 y.o.   MRN: [id number] Patient is doing well with clear liquids with no nausea or vomiting or any abdominal discomfort. Abdomen is soft with good bowel sounds in incision is clean Vital signs are stable and she is afebrile. We'll remove her NG tube today and leave on clear liquid diet  Oral pain management and resume her blood pressure medicine

## 2016-08-07 LAB — CBC WITH DIFFERENTIAL/PLATELET
Basophils Absolute: 0 10*3/uL (ref 0–0.1)
Basophils Relative: 0 %
EOS ABS: 0.1 10*3/uL (ref 0–0.7)
Eosinophils Relative: 4 %
HCT: 31.5 % — ABNORMAL LOW (ref 35.0–47.0)
HEMOGLOBIN: 10.6 g/dL — AB (ref 12.0–16.0)
LYMPHS ABS: 0.9 10*3/uL — AB (ref 1.0–3.6)
Lymphocytes Relative: 24 %
MCH: 29.4 pg (ref 26.0–34.0)
MCHC: 33.6 g/dL (ref 32.0–36.0)
MCV: 87.4 fL (ref 80.0–100.0)
Monocytes Absolute: 0.4 10*3/uL (ref 0.2–0.9)
Monocytes Relative: 11 %
NEUTROS PCT: 61 %
Neutro Abs: 2.4 10*3/uL (ref 1.4–6.5)
Platelets: 103 10*3/uL — ABNORMAL LOW (ref 150–440)
RBC: 3.6 MIL/uL — ABNORMAL LOW (ref 3.80–5.20)
RDW: 13.9 % (ref 11.5–14.5)
WBC: 3.9 10*3/uL (ref 3.6–11.0)

## 2016-08-07 MED ORDER — ENOXAPARIN SODIUM 40 MG/0.4ML ~~LOC~~ SOLN: 40.0000 mg | SUBCUTANEOUS | Status: DC

## 2016-08-07 MED ORDER — BISACODYL 10 MG RE SUPP
10.0000 mg | Freq: Once | RECTAL | Status: AC
  Administered 2016-08-07: 10 mg via RECTAL
  Filled 2016-08-07: qty 1

## 2016-08-07 MED ORDER — ACETAMINOPHEN 325 MG PO TABS: 650.0000 mg | ORAL_TABLET | Freq: Four times a day (QID) | ORAL | Status: DC | PRN

## 2016-08-07 MED ORDER — OXYCODONE HCL 5 MG PO TABS: 5.0000 mg | ORAL_TABLET | ORAL | Status: DC | PRN

## 2016-08-07 NOTE — Plan of Care (Signed)
Problem: Altered GI Function ([place]-1.4) Goal: Nutrition education Formal process to instruct or train a patient/client in a skill or to impart knowledge to help patients/clients voluntarily manage or modify food choices and eating behavior to maintain or improve health.  Outcome: Completed/Met Date Met: 08/07/16 Nutrition Education Note  RD consulted for nutrition education regarding a diet status partial gastrectomy.  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  Spoke with patient at bedside. She reports she has some occasional nausea and abdominal pain, but overall is feeling well. She reports she is tolerating her full liquid diet well. Patient reports prior to admission she was eating well, 100% of 2-3 meals daily. Denies any weight loss PTA or even since her diagnosis of lung cancer over one year ago.   RD provided "Gastric Surgery Nutrition Therapy" handout from the [employer]. Encouraged patient to eat 4-6 small, frequent meals and snacks throughout the day. Discouraged eating foods or beverages that have a lot of sugar or sugar alcohols in them. Encouraged patient to limit fluid with meals to 4 oz or  cup and wait 30 to 60 minutes after eating to have beverages. Encouraged intake of a protein food at each meal and snack. Encouraged soft, well-cooked foods that are low in fiber.  RD discussed why it is important to adhere to list of recommended foods, and foods to avoid. Encouraged slow progression of diet after surgery by trying one or two foods per meal.    Expect good compliance  Body mass index is 23.96 kg/m. Pt meets criteria for normal weight based on current BMI.   Current diet order is full liquids, patient is consuming approximately 100% of meals at this time. Labs and  medications reviewed. No further nutrition interventions warranted at this time. RD contact information provided. If additional nutrition issues arise, please re-consult RD.  Willey Blade, MS, RD, LDN Pager: [phone] After Hours Pager: [phone]

## 2016-08-07 NOTE — Progress Notes (Signed)
Patient ID: Stacy Cook, female   DOB: Jul 08, 1935, 81 y.o.   MRN: [id number] Only c/o feeling a little bloated. No n/v. No BM yet, very little flatus. AVSS Abdomen is soft, mildly distended, good bowel sounds. Trial of dulcolax suppository. D/C IV. Full liq diet- smaller meals, dietary to consult Pathology pending

## 2016-08-07 NOTE — Progress Notes (Signed)
Anticoagulation monitoring(Lovenox):  80yo  F ordered Lovenox 30 mg Q24h  Filed Weights   08/03/16 0641  Weight: 131 lb (59.4 kg)   BMI 24   Lab Results  Component Value Date   CREATININE 0.93 08/05/2016   CREATININE 1.09 (H) 08/04/2016   CREATININE 1.30 (H) 08/03/2016   Estimated Creatinine Clearance: 39.4 mL/min (by C-G formula based on SCr of 0.93 mg/dL). Hemoglobin & Hematocrit     Component Value Date/Time   HGB 10.6 (L) 08/07/2016 0549   HGB 11.2 (L) 10/29/2012 1536   HCT 31.5 (L) 08/07/2016 0549   HCT 31.2 (L) 10/29/2012 1536   Renal fxn improved:  Per Protocol for Patient with estCrcl > 30 ml/min and BMI < 40, will transition to Lovenox 40 mg Q24h      Chinita Greenland PharmD Clinical Pharmacist 08/07/2016 11:24 AM

## 2016-08-07 NOTE — Progress Notes (Addendum)
Per Dr. Jamal Collin he wants dietician to come see pt will place order. Also spoke with MD about the fact that pt is a diabetic and we have not been doing fingerstick. Per he does not want to place an order for fingerstick at this time.

## 2016-08-08 ENCOUNTER — Ambulatory Visit: Payer: Medicare Other | Admitting: Internal Medicine

## 2016-08-08 ENCOUNTER — Other Ambulatory Visit: Payer: Self-pay | Admitting: Pathology

## 2016-08-08 ENCOUNTER — Other Ambulatory Visit: Payer: Medicare Other

## 2016-08-08 LAB — SURGICAL PATHOLOGY

## 2016-08-08 MED ORDER — OXYCODONE HCL 5 MG PO TABS: 5.0000 mg | ORAL_TABLET | ORAL | 0 refills | Status: DC | PRN

## 2016-08-08 NOTE — Discharge Summary (Signed)
Physician Discharge Summary  Patient ID: Stacy Cook MRN: [id number] DOB/AGE: 07/30/1935 81 y.o.  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 Subsequent evaluation including PET scan revealed no evidence of metastatic disease or any lymphadenopathy in the region. Repeat endoscopy to 3 months later again revealed the same extremely small mucosal abnormality which on biopsy again revealed same adenocarcinoma. She has already had endoscopic ultrasound which failed to reveal any finding in the mucosa or in the perigastric region. The half ago the patient underwent the right lung lobectomy for an adenocarcinoma stage I-done well from that. Given the isolated finding an extremely small nature and location in the distal stomach a partial gastrectomy was recommended. Patient was agreeable and she was brought in for the same. On 427 she underwent partial gastrectomy with gastroduodenostomy. Her postoperative course was essentially essentially uncomplicated. NG tube was removed on the third postoperative day and she was started on a liquid diet and currently on a full liquid diet. She is tolerating this well and her bowels are started to function. She is now being discharged to be followed as an outpatient. Final pathology report is still pending but preliminary report says that she had a very small mucosal adenocarcinoma, T1 N0. Patient is also known to have ITP with a platelet count covering the area of the 100,000 mark. Post surgery the platelets have dropped to about 80,000 but came back up to 100,000 day prior to discharge. Consults: none  Significant Diagnostic  Studies: None  Treatments: surgery: Partial distal gastrectomy with Billroth I anastomosis  Discharge Exam: Blood pressure (!) 157/43, pulse 69, temperature 98 F (36.7 C), temperature source Oral, resp. rate 17, height '5\' 2"'$  (1.575 m), weight 131 lb (59.4 kg), SpO2 100 %. GI: soft, non-tender; bowel sounds normal; no masses,  no organomegaly Incision is clean and intact. Lungs are clear Disposition: 01-Home or Self Care  Discharge Instructions    Call MD for:  persistant nausea and vomiting    Complete by:  As directed    Call MD for:  redness, tenderness, or signs of infection (pain, swelling, redness, odor or green/yellow discharge around incision site)    Complete by:  As directed    Call MD for:  severe uncontrolled pain    Complete by:  As directed    Call MD for:  temperature >100.4    Complete by:  As directed    Diet - low sodium heart healthy    Complete by:  As directed    Discharge instructions    Complete by:  As directed    No exertional activity May shower     Allergies as of 08/08/2016      Reactions   Statins    Joint pain   Sulfa Antibiotics Nausea And Vomiting   Pneumococcal Vaccine Itching   Hives and fever      Medication List    STOP taking these medications   predniSONE 20 MG tablet Commonly known as:  DELTASONE     TAKE these medications   aspirin 81 MG tablet Take 81 mg by mouth daily. Reported on 06/03/2015   calcium carbonate 500 MG chewable tablet Commonly known as:  TUMS - dosed in  mg elemental calcium Chew 2 tablets by mouth 2 (two) times daily as needed for indigestion or heartburn.   cholecalciferol 1000 units tablet Commonly known as:  VITAMIN D Take 1,000 Units by mouth daily.   Fish Oil 1000 MG Caps Take 1,000 mg by mouth 2 (two) times daily.   losartan 100 MG tablet Commonly known as:  COZAAR Take 100 mg by mouth daily.   metFORMIN 500 MG tablet Commonly known as:  GLUCOPHAGE Take 500 mg by mouth daily with breakfast.    metoprolol succinate 25 MG 24 hr tablet Commonly known as:  TOPROL-XL Take 25 mg by mouth every evening.   oxyCODONE 5 MG immediate release tablet Commonly known as:  Oxy IR/ROXICODONE Take 1 tablet (5 mg total) by mouth every 4 (four) hours as needed for moderate pain.   PROBIOTIC DAILY PO Take 1 tablet by mouth daily.   RABEprazole 20 MG tablet Commonly known as:  ACIPHEX Take 20 mg by mouth 2 (two) times daily. Has stopped prior to procedure   tizanidine 2 MG capsule Commonly known as:  ZANAFLEX Take 2 mg by mouth 2 (two) times daily.   V-R VITAMIN B-12 500 MCG tablet Generic drug:  cyanocobalamin Take 500 mcg by mouth daily.      Follow-up Information    Christene Lye, MD Follow up in 1 week(s).   Specialties:  General Surgery, Radiology Why:  Also come to office this Friday am, 5/4, for staple removal Contact information: [address] [place] [address] [phone]           Signed: Christene Lye 08/08/2016, 8:13 AM

## 2016-08-08 NOTE — Progress Notes (Signed)
Pt to be discharged per Md order. IV removed. Scripts given to pt and all follow ups made. Instructions reviewed with pt and all questions answered. Will discharge in wheelchair when pt ride is available.

## 2016-08-08 NOTE — Care Management Important Message (Signed)
Important Message  Patient Details  Name: Stacy Cook MRN: [id number] Date of Birth: Dec 08, 1935   Medicare Important Message Given:  Yes    Beverly Sessions, RN 08/08/2016, 10:27 AM

## 2016-08-10 ENCOUNTER — Ambulatory Visit (INDEPENDENT_AMBULATORY_CARE_PROVIDER_SITE_OTHER): Payer: Medicare Other | Admitting: *Deleted

## 2016-08-10 DIAGNOSIS — C160 Malignant neoplasm of cardia: Secondary | ICD-10-CM

## 2016-08-10 NOTE — Patient Instructions (Signed)
Return as scheduled 

## 2016-08-10 NOTE — Progress Notes (Signed)
Patient came in today for a wound check.  Staples was removed.The wound is clean, with no signs of infection noted. Follow up as scheduled.

## 2016-08-14 ENCOUNTER — Ambulatory Visit (INDEPENDENT_AMBULATORY_CARE_PROVIDER_SITE_OTHER): Payer: Medicare Other | Admitting: General Surgery

## 2016-08-14 ENCOUNTER — Encounter: Payer: Self-pay | Admitting: General Surgery

## 2016-08-14 VITALS — BP 130/54 | HR 60 | Resp 12 | Ht 62.0 in | Wt 121.0 lb

## 2016-08-14 DIAGNOSIS — C169 Malignant neoplasm of stomach, unspecified: Secondary | ICD-10-CM

## 2016-08-14 NOTE — Patient Instructions (Addendum)
May drive, but no exertional activity. Don't lift more than 10-15 lbs. Continue diet of small frequent meals.   Follow up in 1 month. Patient instructed to call if experiencing pain or bloating.

## 2016-08-14 NOTE — Progress Notes (Signed)
Patient ID: Stacy Cook, female   DOB: 1935-09-07, 81 y.o.   MRN: [id number]  Chief Complaint  Patient presents with  . Routine Post Op    HPI Stacy Cook is a 81 y.o. female.  Here today for postoperative visit, partial gastrectomy 08-03-16,  she states she is doing well. Denies any gastrointestinal issues, bowels are moving regular. Mild nausea this morning, no vomiting. She is eating smaller meals.  HPI  Past Medical History:  Diagnosis Date  . Anemia   . Arthritis    back  . Basal cell carcinoma of skin 2016  . Cancer of right lung ([place]) 05/09/2015   Dr. Genevive Bi performed Right lower lobe lobectomy.   . Cataract   . Diabetes mellitus without complication ([place])   . GERD (gastroesophageal reflux disease)   . Hypertension   . Murmur   . Osteoporosis   . Stomach tumor (benign)     Past Surgical History:  Procedure Laterality Date  . ABDOMINAL HYSTERECTOMY    . APPENDECTOMY    . ESOPHAGOGASTRODUODENOSCOPY (EGD) WITH PROPOFOL N/A 03/27/2016   Procedure: ESOPHAGOGASTRODUODENOSCOPY (EGD) WITH PROPOFOL;  Surgeon: Lollie Sails, MD;  Location: [facility] ENDOSCOPY;  Service: Endoscopy;  Laterality: N/A;  . ESOPHAGOGASTRODUODENOSCOPY (EGD) WITH PROPOFOL N/A 05/28/2016   Procedure: ESOPHAGOGASTRODUODENOSCOPY (EGD) WITH PROPOFOL;  Surgeon: Lollie Sails, MD;  Location[facility] ENDOSCOPY;  Service: Endoscopy;  Laterality: N/A;  . PARTIAL GASTRECTOMY N/A 08/03/2016   Procedure: PARTIAL GASTRECTOMY;  Surgeon: Christene Lye, MD;  Location: [facility] ORS;  Service: General;  Laterality: N/A;  . SHOULDER ARTHROSCOPY W/ CAPSULAR REPAIR Right   . THORACOTOMY Right 05/09/2015   Procedure: THORACOTOMY, RIGHT LOWER LOBECTOMY, BRONCHOSCOPY;  Surgeon: Nestor Lewandowsky, MD;  Location: [facility] ORS;  Service: Thoracic;  Laterality: Right;  . UPPER GI ENDOSCOPY N/A 08/03/2016   Procedure: UPPER  ENDOSCOPY;  Surgeon: Christene Lye, MD;  Location: [facility] ORS;  Service: General;  Laterality: N/A;     Family History  Problem Relation Age of Onset  . Diabetes Other   . Breast cancer Neg Hx     Social History Social History  Substance Use Topics  . Smoking status: Never Smoker  . Smokeless tobacco: Never Used  . Alcohol use No    Allergies  Allergen Reactions  . Statins     Joint pain  . Sulfa Antibiotics Nausea And Vomiting  . Pneumococcal Vaccine Itching    Hives and fever    Current Outpatient Prescriptions  Medication Sig Dispense Refill  . aspirin 81 MG tablet Take 81 mg by mouth daily. Reported on 06/03/2015    . calcium carbonate (TUMS - DOSED IN MG ELEMENTAL CALCIUM) 500 MG chewable tablet Chew 2 tablets by mouth 2 (two) times daily as needed for indigestion or heartburn.    . cholecalciferol (VITAMIN D) 1000 UNITS tablet Take 1,000 Units by mouth daily.    . cyanocobalamin (V-R VITAMIN B-12) 500 MCG tablet Take 500 mcg by mouth daily.     Marland Kitchen losartan (COZAAR) 100 MG tablet Take 100 mg by mouth daily.     . metFORMIN (GLUCOPHAGE) 500 MG tablet Take 500 mg by mouth daily with breakfast.     . metoprolol succinate (TOPROL-XL) 25 MG 24 hr tablet Take 25 mg by mouth every evening.     . Omega-3 Fatty Acids (FISH OIL) 1000 MG CAPS Take 1,000 mg by mouth 2 (two) times daily.     Marland Kitchen oxyCODONE (OXY IR/ROXICODONE) 5 MG immediate release tablet  Take 1 tablet (5 mg total) by mouth every 4 (four) hours as needed for moderate pain. 30 tablet 0  . Probiotic Product (PROBIOTIC DAILY PO) Take 1 tablet by mouth daily.     . RABEprazole (ACIPHEX) 20 MG tablet Take 20 mg by mouth 2 (two) times daily. Has stopped prior to procedure    . tizanidine (ZANAFLEX) 2 MG capsule Take 2 mg by mouth 2 (two) times daily.     No current facility-administered medications for this visit.     Review of Systems Review of Systems  Constitutional: Negative.   Respiratory: Negative.   Cardiovascular: Negative.   Gastrointestinal: Positive for nausea. Negative for constipation and diarrhea.     Blood pressure (!) 130/54, pulse 60, resp. rate 12, height '5\' 2"'$  (1.575 m), weight 121 lb (54.9 kg).  Physical Exam Physical Exam  Constitutional: She is oriented to person, place, and time. She appears well-developed and well-nourished.  Cardiovascular: Normal rate and regular rhythm.   Murmur heard. Pulmonary/Chest: Effort normal and breath sounds normal.  Abdominal: Soft. Bowel sounds are normal.    Neurological: She is alert and oriented to person, place, and time.  Skin: Skin is warm and dry.  Psychiatric: Her behavior is normal.    Data Reviewed Prior notes reviewed  Pathology reviewed  Assessment    Signet ring adenocarcinoma of distal stomach, s/p partial gastrectomy. Doing well.     Plan    May drive, but no exertional activity. Don't lift more than 10-15 lbs. Continue diet of small frequent meals.   Follow up in 1 month      HPI, Physical Exam, Assessment and Plan have been scribed under the direction and in the presence of Mckinley Jewel, MD  Karie Fetch, RN  I have completed the exam and reviewed the above documentation for accuracy and completeness.  I agree with the above.  Haematologist has been used and any errors in dictation or transcription are unintentional.  Amedeo Detweiler G. Jamal Collin, M.D., F.A.C.S.  Junie Panning G 08/15/2016, 8:45 AM

## 2016-08-24 ENCOUNTER — Inpatient Hospital Stay: Payer: Medicare Other | Attending: Internal Medicine

## 2016-08-24 ENCOUNTER — Inpatient Hospital Stay (HOSPITAL_BASED_OUTPATIENT_CLINIC_OR_DEPARTMENT_OTHER): Payer: Medicare Other | Admitting: Internal Medicine

## 2016-08-24 ENCOUNTER — Other Ambulatory Visit: Payer: Self-pay

## 2016-08-24 VITALS — BP 137/51 | HR 61 | Temp 97.5°F | Wt 121.1 lb

## 2016-08-24 DIAGNOSIS — E119 Type 2 diabetes mellitus without complications: Secondary | ICD-10-CM | POA: Insufficient documentation

## 2016-08-24 DIAGNOSIS — Z8509 Personal history of malignant neoplasm of other digestive organs: Secondary | ICD-10-CM | POA: Insufficient documentation

## 2016-08-24 DIAGNOSIS — K219 Gastro-esophageal reflux disease without esophagitis: Secondary | ICD-10-CM

## 2016-08-24 DIAGNOSIS — M810 Age-related osteoporosis without current pathological fracture: Secondary | ICD-10-CM | POA: Diagnosis not present

## 2016-08-24 DIAGNOSIS — I10 Essential (primary) hypertension: Secondary | ICD-10-CM | POA: Insufficient documentation

## 2016-08-24 DIAGNOSIS — K2940 Chronic atrophic gastritis without bleeding: Secondary | ICD-10-CM

## 2016-08-24 DIAGNOSIS — Z7982 Long term (current) use of aspirin: Secondary | ICD-10-CM | POA: Diagnosis not present

## 2016-08-24 DIAGNOSIS — M129 Arthropathy, unspecified: Secondary | ICD-10-CM | POA: Diagnosis not present

## 2016-08-24 DIAGNOSIS — Z85118 Personal history of other malignant neoplasm of bronchus and lung: Secondary | ICD-10-CM

## 2016-08-24 DIAGNOSIS — Z85828 Personal history of other malignant neoplasm of skin: Secondary | ICD-10-CM

## 2016-08-24 DIAGNOSIS — Z9221 Personal history of antineoplastic chemotherapy: Secondary | ICD-10-CM | POA: Insufficient documentation

## 2016-08-24 DIAGNOSIS — D649 Anemia, unspecified: Secondary | ICD-10-CM

## 2016-08-24 DIAGNOSIS — C160 Malignant neoplasm of cardia: Secondary | ICD-10-CM

## 2016-08-24 DIAGNOSIS — C3431 Malignant neoplasm of lower lobe, right bronchus or lung: Secondary | ICD-10-CM

## 2016-08-24 DIAGNOSIS — Z7984 Long term (current) use of oral hypoglycemic drugs: Secondary | ICD-10-CM | POA: Diagnosis not present

## 2016-08-24 DIAGNOSIS — R011 Cardiac murmur, unspecified: Secondary | ICD-10-CM

## 2016-08-24 DIAGNOSIS — Z79899 Other long term (current) drug therapy: Secondary | ICD-10-CM

## 2016-08-24 LAB — CBC WITH DIFFERENTIAL/PLATELET
BASOS ABS: 0.1 10*3/uL (ref 0–0.1)
Basophils Relative: 1 %
EOS PCT: 3 %
Eosinophils Absolute: 0.1 10*3/uL (ref 0–0.7)
HCT: 33.3 % — ABNORMAL LOW (ref 35.0–47.0)
Hemoglobin: 11.7 g/dL — ABNORMAL LOW (ref 12.0–16.0)
LYMPHS PCT: 32 %
Lymphs Abs: 1.6 10*3/uL (ref 1.0–3.6)
MCH: 30.6 pg (ref 26.0–34.0)
MCHC: 35.1 g/dL (ref 32.0–36.0)
MCV: 87 fL (ref 80.0–100.0)
Monocytes Absolute: 0.3 10*3/uL (ref 0.2–0.9)
Monocytes Relative: 7 %
NEUTROS ABS: 2.9 10*3/uL (ref 1.4–6.5)
NEUTROS PCT: 57 %
PLATELETS: 131 10*3/uL — AB (ref 150–440)
RBC: 3.82 MIL/uL (ref 3.80–5.20)
RDW: 13.5 % (ref 11.5–14.5)
WBC: 5 10*3/uL (ref 3.6–11.0)

## 2016-08-24 MED ORDER — AMOXICILL-CLARITHRO-LANSOPRAZ PO MISC: Freq: Two times a day (BID) | ORAL | 0 refills | Status: DC

## 2016-08-24 MED ORDER — ONDANSETRON HCL 8 MG PO TABS: ORAL_TABLET | ORAL | 1 refills | Status: DC

## 2016-08-24 NOTE — Assessment & Plan Note (Deleted)
#   Signet ring adenocarcinoma- gastric incisura; STAGE I;PET-NED. Unfortunately, repeat surveillance endoscopy/ Bx- recurrent signet ring adenocarcinoma. Awaiting partial gastrectomy with Dr. Jamal Collin on April 27th. Discussed with Dr.Sankar.   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 Status post prednsione 40 mg/day- in anticipation of gastrectomy. Platelets still at 100.  Pt intolerant; no significant improvement noted; discussed re: IVIG if Dr.Sankar needs platelets > 100. Stop prednisone 20 mgday x3 more days; and then STOP.   #  Right lung adenocarcinoma stage IB [ positive for  Visceral pleural invasion/ size 2.3 cm] Status post lobectomy. No adjuvant chemo. CT Chest NED.    # follow up 4-6 weeks post surgery/cbc.   Addendum discussed with Dr. Jamal Collin; he plans to repeat a CBC on 4/23- if platelets less than 100 recommend IVIG. He will inform us.

## 2016-08-24 NOTE — Progress Notes (Signed)
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 JAN 2018- PET NED. April 2018- S/p partial gastrectomy [Dr.Sankar]- STAGE I ADENO CA; NO adjuvant therapy.  # STAGE I CARCINOID s/p partial gastrectomy   # May 2018- Chronic Atrophic gastritis- Prevpac   # FEB 2017- ADENOCARCINOMA with Lepidic 80%-20% acinar pattern; pT2a [Stage IB;T-2.3cm; visceral pleural invasion present; pN=0 ]; AUG 2017- CT NED       Primary malignant neoplasm of right lower lobe of lung ([place])   12/02/2015 Initial Diagnosis    Primary malignant neoplasm of right lower lobe of lung (HCC)      Adenocarcinoma of gastric cardia (HCC)     HISTORY OF PRESENTING ILLNESS:  Stacy Cook 81 y.o.  female patient Stage I lung cancer adenocarcinoma; Prior history of stomach carcinoid; is here status post surgery for her gastric adenocarcinoma. She had a surgery done approximately 3 weeks ago.  Postoperative course was uneventful. She is currently home; she was recently evaluated by Dr. Jamal Collin outpatient.  She admits to mild abdominal discomfort; lower abdomen. Denies any aggravating or relieving factors. Denies any constipation. No blood in stools black stools. No fevers or chills. Incision is well healing.  ROS: A complete 10 point review of system is done which is negative except mentioned above in history of present illness  MEDICAL HISTORY:  Past Medical History:  Diagnosis Date  . Anemia   . Arthritis    back  . Basal cell carcinoma of skin 2016  . Cancer of right lung ([place]) 05/09/2015    Dr. Genevive Bi performed Right lower lobe lobectomy.   . Cataract   . Diabetes mellitus without complication ([place])   . GERD (gastroesophageal reflux disease)   . Hypertension   . Murmur   . Osteoporosis   . Stomach tumor (benign)     SURGICAL HISTORY: Past Surgical History:  Procedure Laterality Date  . ABDOMINAL HYSTERECTOMY    . APPENDECTOMY    . ESOPHAGOGASTRODUODENOSCOPY (EGD) WITH PROPOFOL N/A 03/27/2016   Procedure: ESOPHAGOGASTRODUODENOSCOPY (EGD) WITH PROPOFOL;  Surgeon: Lollie Sails, MD;  Location: [facility] ENDOSCOPY;  Service: Endoscopy;  Laterality: N/A;  . ESOPHAGOGASTRODUODENOSCOPY (EGD) WITH PROPOFOL N/A 05/28/2016   Procedure: ESOPHAGOGASTRODUODENOSCOPY (EGD) WITH PROPOFOL;  Surgeon: Lollie Sails, MD;  Location: [facility] ENDOSCOPY;  Service: Endoscopy;  Laterality: N/A;  . PARTIAL GASTRECTOMY N/A 08/03/2016   Procedure: PARTIAL GASTRECTOMY;  Surgeon: Christene Lye, MD;  Location: [facility] ORS;  Service: General;  Laterality: N/A;  . SHOULDER ARTHROSCOPY W/ CAPSULAR REPAIR Right   . THORACOTOMY Right 05/09/2015   Procedure: THORACOTOMY, RIGHT LOWER LOBECTOMY, BRONCHOSCOPY;  Surgeon: Nestor Lewandowsky, MD;  Location: [facility] ORS;  Service: Thoracic;  Laterality: Right;  . UPPER GI ENDOSCOPY N/A 08/03/2016   Procedure: UPPER  ENDOSCOPY;  Surgeon: Christene Lye, MD;  Location: [facility] ORS;  Service: General;  Laterality: N/A;    SOCIAL HISTORY: Social History   Social History  . Marital status: Widowed    Spouse name: N/A  . Number of children: N/A  . Years of education: N/A   Occupational History  . Not on file.  Social History Main Topics  . Smoking status: Never Smoker  . Smokeless tobacco: Never Used  . Alcohol use No  . Drug use: No  . Sexual activity: Not on file   Other Topics Concern  . Not on file   Social History Narrative  . No narrative on file    FAMILY HISTORY: Family History  Problem Relation Age of Onset  . Diabetes Other   . Breast  cancer Neg Hx     ALLERGIES:  is allergic to statins; sulfa antibiotics; and pneumococcal vaccine.  MEDICATIONS:  Current Outpatient Prescriptions  Medication Sig Dispense Refill  . aspirin 81 MG tablet Take 81 mg by mouth daily. Reported on 06/03/2015    . calcium carbonate (TUMS - DOSED IN MG ELEMENTAL CALCIUM) 500 MG chewable tablet Chew 2 tablets by mouth 2 (two) times daily as needed for indigestion or heartburn.    . cholecalciferol (VITAMIN D) 1000 UNITS tablet Take 1,000 Units by mouth daily.    . cyanocobalamin (V-R VITAMIN B-12) 500 MCG tablet Take 500 mcg by mouth daily.     Marland Kitchen losartan (COZAAR) 100 MG tablet Take 100 mg by mouth daily.     . metFORMIN (GLUCOPHAGE) 500 MG tablet Take 500 mg by mouth daily with breakfast.     . metoprolol succinate (TOPROL-XL) 25 MG 24 hr tablet Take 25 mg by mouth every evening.     . Omega-3 Fatty Acids (FISH OIL) 1000 MG CAPS Take 1,000 mg by mouth 2 (two) times daily.     Marland Kitchen oxyCODONE (OXY IR/ROXICODONE) 5 MG immediate release tablet Take 1 tablet (5 mg total) by mouth every 4 (four) hours as needed for moderate pain. 30 tablet 0  . Probiotic Product (PROBIOTIC DAILY PO) Take 1 tablet by mouth daily.     . tizanidine (ZANAFLEX) 2 MG capsule Take 2 mg by mouth 2 (two) times daily.    Marland Kitchen amoxicillin-clarithromycin-lansoprazole (PREVPAC) combo pack Take by mouth 2 (two) times daily. Follow package directions. 1 kit 0  . ondansetron (ZOFRAN) 8 MG tablet One every 8 hours as needed for nausea. 40 tablet 1   No current facility-administered medications for this visit.       Marland Kitchen  PHYSICAL EXAMINATION: ECOG PERFORMANCE STATUS: 0 - Asymptomatic  Vitals:   08/24/16 1426  BP: (!) 137/51  Pulse: 61  Temp: 97.5 F (36.4 C)   Filed Weights   08/24/16 1426  Weight: 121 lb 2 oz (54.9 kg)    GENERAL: Well-nourished well-developed; Alert, no distress and comfortable.   Alone EYES: no pallor or icterus OROPHARYNX: no thrush or ulceration; good  dentition  NECK: supple, no masses felt LYMPH:  no palpable lymphadenopathy in the cervical, axillary or inguinal regions LUNGS: clear to auscultation and  No wheeze or crackles HEART/CVS: regular rate & rhythm; positive for murmur. No lower extremity edema ABDOMEN: abdomen soft, non-tender and normal bowel sounds; incision well healing. Musculoskeletal:no cyanosis of digits and no clubbing  PSYCH: alert & oriented x 3 with fluent speech NEURO: no focal motor/sensory deficits     LABORATORY DATA:  I have reviewed the data as listed Lab Results  Component Value Date   WBC 5.0 08/24/2016   HGB 11.7 (L) 08/24/2016   HCT 33.3 (L) 08/24/2016   MCV 87.0 08/24/2016   PLT 131 (L) 08/24/2016    Recent Labs  12/02/15 1001 04/10/16 1411  07/06/16 1015 08/03/16 1354 08/04/16 0350 08/05/16 0259  NA 139 141  < > 139  --  133* 130*  K 4.8 4.3  < > 4.2  --  4.3 4.2  CL 106 104  < > 105  --  103 100*  CO2 28 29  < > 29  --  26 24  GLUCOSE 101* 88  < > 101*  --  103* 87  BUN 24* 16  < > 21*  --  20 17  CREATININE 0.99 1.06*  < > 1.11* 1.30* 1.09* 0.93  CALCIUM 8.8* 9.2  < > 9.4  --  7.9* 7.9*  GFRNONAA 54* 49*  < > 46* 38* 47* 57*  GFRAA >60 57*  < > 54* 44* 55* >60  PROT 7.0 7.2  --  7.1  --   --   --   ALBUMIN 4.2 4.3  --  4.0  --   --   --   AST 24 21  --  20  --   --   --   ALT 21 11*  --  12*  --   --   --   ALKPHOS 64 68  --  70  --   --   --   BILITOT 0.6 0.8  --  0.7  --   --   --   < > = values in this interval not displayed.  RADIOGRAPHIC STUDIES: I have personally reviewed the radiological images as listed and agreed with the findings in the report. No results found.  ASSESSMENT & PLAN:  Adenocarcinoma of gastric cardia ([place]) # Signet ring adenocarcinoma- gastric incisura; STAGE I; status post partial gastrectomy. No role for any adjuvant therapy.  # chronic atrophic gastritis changes- surgery/pathology. Recommend Prevpac. prescribed  # chronic ITP- platelet today  130s. Asymptomatic. Continue surveillance.  #  Right lung adenocarcinoma stage IB [ positive for  Visceral pleural invasion/ size 2.3 cm] Status post lobectomy. No adjuvant chemo.    # follow up  In 4 months/labs; no scan.      Cammie Sickle, MD 08/24/2016 5:27 PM

## 2016-08-24 NOTE — Assessment & Plan Note (Addendum)
#   Signet ring adenocarcinoma- gastric incisura; STAGE I; status post partial gastrectomy. No role for any adjuvant therapy.  # chronic atrophic gastritis changes- surgery/pathology. Recommend Prevpac. prescribed  # chronic ITP- platelet today 130s. Asymptomatic. Continue surveillance.  #  Right lung adenocarcinoma stage IB [ positive for  Visceral pleural invasion/ size 2.3 cm] Status post lobectomy. No adjuvant chemo.    # follow up  In 4 months/labs; no scan.

## 2016-09-12 ENCOUNTER — Encounter: Payer: Self-pay | Admitting: General Surgery

## 2016-09-12 ENCOUNTER — Ambulatory Visit (INDEPENDENT_AMBULATORY_CARE_PROVIDER_SITE_OTHER): Payer: Medicare Other | Admitting: General Surgery

## 2016-09-12 VITALS — BP 146/58 | HR 56 | Resp 12 | Ht 62.0 in | Wt 120.0 lb

## 2016-09-12 DIAGNOSIS — C169 Malignant neoplasm of stomach, unspecified: Secondary | ICD-10-CM

## 2016-09-12 NOTE — Progress Notes (Signed)
Patient ID: Stacy Cook, female   DOB: 01/27/1936, 81 y.o.   MRN: [id number]  Chief Complaint  Patient presents with  . Routine Post Op    HPI Stacy Cook is a 81 y.o. female.  Here today for postoperative visit, she states she is doing well. She was having nausea and the nausea tablet was not helping so she stopped all her medications. Blood sugars are average 100-110. She is not nauseated, only weak. Denies any gastrointestinal issues, bowels are moving once a week.  HPI  Past Medical History:  Diagnosis Date  . Anemia   . Arthritis    back  . Basal cell carcinoma of skin 2016  . Cancer of right lung (HCC) 05/09/2015   Dr. Oaks performed Right lower lobe lobectomy.   . Cataract   . Diabetes mellitus without complication (HCC)   . GERD (gastroesophageal reflux disease)   . Hypertension   . Murmur   . Osteoporosis   . Stomach tumor (benign)     Past Surgical History:  Procedure Laterality Date  . ABDOMINAL HYSTERECTOMY    . APPENDECTOMY    . ESOPHAGOGASTRODUODENOSCOPY (EGD) WITH PROPOFOL N/A 03/27/2016   Procedure: ESOPHAGOGASTRODUODENOSCOPY (EGD) WITH PROPOFOL;  Surgeon: Martin U Skulskie, MD;  Location: [facility] ENDOSCOPY;  Service: Endoscopy;  Laterality: N/A;  . ESOPHAGOGASTRODUODENOSCOPY (EGD) WITH PROPOFOL N/A 05/28/2016   Procedure: ESOPHAGOGASTRODUODENOSCOPY (EGD) WITH PROPOFOL;  Surgeon: Martin U Skulskie, MD;  Location: [facility] ENDOSCOPY;  Service: Endoscopy;  Laterality: N/A;  . PARTIAL GASTRECTOMY N/A 08/03/2016   Procedure: PARTIAL GASTRECTOMY;  Surgeon: Mailani Degroote G Ruweyda Macknight, MD;  Location: [facility] ORS;  Service: General;  Laterality: N/A;  . SHOULDER ARTHROSCOPY W/ CAPSULAR REPAIR Right   . THORACOTOMY Right 05/09/2015   Procedure: THORACOTOMY, RIGHT LOWER LOBECTOMY, BRONCHOSCOPY;  Surgeon: Timothy Oaks, MD;  Location: [facility] ORS;  Service: Thoracic;  Laterality: Right;  . UPPER GI ENDOSCOPY N/A 08/03/2016   Procedure: UPPER  ENDOSCOPY;  Surgeon: Lometa Riggin G  Gladyse Corvin, MD;  Location: [facility] ORS;  Service: General;  Laterality: N/A;    Family History  Problem Relation Age of Onset  . Diabetes Other   . Breast cancer Neg Hx     Social History Social History  Substance Use Topics  . Smoking status: Never Smoker  . Smokeless tobacco: Never Used  . Alcohol use No    Allergies  Allergen Reactions  . Statins     Joint pain  . Sulfa Antibiotics Nausea And Vomiting  . Pneumococcal Vaccine Itching    Hives and fever    Current Outpatient Prescriptions  Medication Sig Dispense Refill  . amoxicillin-clarithromycin-lansoprazole (PREVPAC) combo pack Take by mouth 2 (two) times daily. Follow package directions. (Patient not taking: Reported on 09/12/2016) 1 kit 0  . aspirin 81 MG tablet Take 81 mg by mouth daily. Reported on 06/03/2015    . calcium carbonate (TUMS - DOSED IN MG ELEMENTAL CALCIUM) 500 MG chewable tablet Chew 2 tablets by mouth 2 (two) times daily as needed for indigestion or heartburn.    . cholecalciferol (VITAMIN D) 1000 UNITS tablet Take 1,000 Units by mouth daily.    . cyanocobalamin (V-R VITAMIN B-12) 500 MCG tablet Take 500 mcg by mouth daily.     . losartan (COZAAR) 100 MG tablet Take 100 mg by mouth daily.     . metFORMIN (GLUCOPHAGE) 500 MG tablet Take 500 mg by mouth daily with breakfast.     . metoprolol succinate (TOPROL-XL) 25 MG 24 hr tablet Take 25   mg by mouth every evening.     . Omega-3 Fatty Acids (FISH OIL) 1000 MG CAPS Take 1,000 mg by mouth 2 (two) times daily.     . ondansetron (ZOFRAN) 8 MG tablet One every 8 hours as needed for nausea. (Patient not taking: Reported on 09/12/2016) 40 tablet 1  . oxyCODONE (OXY IR/ROXICODONE) 5 MG immediate release tablet Take 1 tablet (5 mg total) by mouth every 4 (four) hours as needed for moderate pain. (Patient not taking: Reported on 09/12/2016) 30 tablet 0  . Probiotic Product (PROBIOTIC DAILY PO) Take 1 tablet by mouth daily.     . tizanidine (ZANAFLEX) 2 MG capsule Take 2 mg by  mouth 2 (two) times daily.     No current facility-administered medications for this visit.     Review of Systems Review of Systems  Respiratory: Negative.   Cardiovascular: Negative.   Gastrointestinal: Positive for constipation.  Neurological: Positive for weakness.    Blood pressure (!) 146/58, pulse (!) 56, resp. rate 12, height 5' 2" (1.575 m), weight 120 lb (54.4 kg), SpO2 98 %.  Physical Exam Physical Exam  Constitutional: She is oriented to person, place, and time. She appears well-developed and well-nourished.  Eyes: Conjunctivae are normal. No scleral icterus.  Neck: Neck supple.  Cardiovascular: Normal rate and regular rhythm.   Murmur heard.  Systolic murmur is present  Aortic Stenosis  Pulmonary/Chest: Breath sounds normal.  Abdominal: Soft. Bowel sounds are normal. There is no hepatomegaly.    Lymphadenopathy:    She has no cervical adenopathy.  Neurological: She is alert and oriented to person, place, and time.  Skin: Skin is warm and dry.  Psychiatric: She has a normal mood and affect. Her behavior is normal.    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 Endoscopy recommended for follow up at patient's convenience.  Stable exam    Plan    Schedule endoscopy. Follow up in office in 6 weeks.    HPI, Physical Exam, Assessment and Plan have been scribed under the direction and in the presence of S. G. Orean Giarratano, MD  Marsha Hatch, RN  The patient is scheduled for an Upper Endoscopy at [facility] on 09/25/16. They are aware to call the day before to get their arrival time. She will hold her Metformin the day of. The patient is aware of date and instructions.  Documented by Caryl-Lyn M Kennedy LPN  I have completed the exam and reviewed the above documentation for accuracy and completeness.  I agree with the above.  [employer] has been used and any  errors in dictation or transcription are unintentional.  Joss Friedel G. Flara Storti, M.D., F.A.C.S.   Mayu Ronk G 09/12/2016, 3:51 PM   

## 2016-09-12 NOTE — Patient Instructions (Addendum)
Schedule endoscopy.  The patient is scheduled for an Upper Endoscopy at [facility] on 09/25/16. They are aware to call the day before to get their arrival time. She will hold her Metformin the day of. The patient is aware of date and instructions.

## 2016-09-19 ENCOUNTER — Ambulatory Visit: Payer: Medicare Other

## 2016-09-24 ENCOUNTER — Ambulatory Visit: Payer: Medicare Other | Admitting: Internal Medicine

## 2016-09-24 ENCOUNTER — Encounter: Payer: Self-pay | Admitting: *Deleted

## 2016-09-24 ENCOUNTER — Other Ambulatory Visit: Payer: Medicare Other

## 2016-09-25 ENCOUNTER — Ambulatory Visit: Payer: Medicare Other | Admitting: Anesthesiology

## 2016-09-25 ENCOUNTER — Telehealth: Payer: Self-pay | Admitting: *Deleted

## 2016-09-25 ENCOUNTER — Encounter
Admission: RE | Disposition: A | Discharge status: Home / Self Care | Payer: Self-pay | Source: Ambulatory Visit | Attending: General Surgery

## 2016-09-25 ENCOUNTER — Ambulatory Visit
Admission: RE | Admit: 2016-09-25 | Discharge: 2016-09-25 | Disposition: A | Discharge status: Home / Self Care | Payer: Medicare Other | Source: Ambulatory Visit | Attending: General Surgery | Admitting: General Surgery

## 2016-09-25 ENCOUNTER — Encounter: Payer: Self-pay | Admitting: *Deleted

## 2016-09-25 DIAGNOSIS — Z85118 Personal history of other malignant neoplasm of bronchus and lung: Secondary | ICD-10-CM | POA: Diagnosis not present

## 2016-09-25 DIAGNOSIS — E119 Type 2 diabetes mellitus without complications: Secondary | ICD-10-CM | POA: Diagnosis not present

## 2016-09-25 DIAGNOSIS — Z7982 Long term (current) use of aspirin: Secondary | ICD-10-CM | POA: Insufficient documentation

## 2016-09-25 DIAGNOSIS — K219 Gastro-esophageal reflux disease without esophagitis: Secondary | ICD-10-CM | POA: Diagnosis not present

## 2016-09-25 DIAGNOSIS — Z7984 Long term (current) use of oral hypoglycemic drugs: Secondary | ICD-10-CM | POA: Insufficient documentation

## 2016-09-25 DIAGNOSIS — I10 Essential (primary) hypertension: Secondary | ICD-10-CM | POA: Diagnosis not present

## 2016-09-25 DIAGNOSIS — Z85828 Personal history of other malignant neoplasm of skin: Secondary | ICD-10-CM | POA: Insufficient documentation

## 2016-09-25 DIAGNOSIS — Z903 Acquired absence of stomach [part of]: Secondary | ICD-10-CM | POA: Diagnosis not present

## 2016-09-25 DIAGNOSIS — K2970 Gastritis, unspecified, without bleeding: Secondary | ICD-10-CM | POA: Diagnosis not present

## 2016-09-25 DIAGNOSIS — K2940 Chronic atrophic gastritis without bleeding: Secondary | ICD-10-CM | POA: Diagnosis present

## 2016-09-25 HISTORY — PX: ESOPHAGOGASTRODUODENOSCOPY (EGD) WITH PROPOFOL: SHX5813

## 2016-09-25 LAB — GLUCOSE, CAPILLARY: Glucose-Capillary: 82 mg/dL (ref 65–99)

## 2016-09-25 SURGERY — ESOPHAGOGASTRODUODENOSCOPY (EGD) WITH PROPOFOL
Anesthesia: General

## 2016-09-25 MED ORDER — PROPOFOL 500 MG/50ML IV EMUL
INTRAVENOUS | Status: DC | PRN
  Administered 2016-09-25: 200 ug/kg/min via INTRAVENOUS

## 2016-09-25 MED ORDER — PROPOFOL 500 MG/50ML IV EMUL
INTRAVENOUS | Status: AC
  Filled 2016-09-25: qty 50

## 2016-09-25 MED ORDER — PROPOFOL 10 MG/ML IV BOLUS
INTRAVENOUS | Status: DC | PRN
  Administered 2016-09-25: 40 mg via INTRAVENOUS
  Administered 2016-09-25: 60 mg via INTRAVENOUS

## 2016-09-25 MED ORDER — SODIUM CHLORIDE 0.9 % IV SOLN
INTRAVENOUS | Status: DC
  Administered 2016-09-25: 1000 mL via INTRAVENOUS

## 2016-09-25 MED ORDER — GLYCOPYRROLATE 0.2 MG/ML IJ SOLN
INTRAMUSCULAR | Status: DC | PRN
  Administered 2016-09-25: 0.2 mg via INTRAVENOUS

## 2016-09-25 MED ORDER — LIDOCAINE 2% (20 MG/ML) 5 ML SYRINGE
INTRAMUSCULAR | Status: DC | PRN
  Administered 2016-09-25: 100 mg via INTRAVENOUS

## 2016-09-25 NOTE — Anesthesia Postprocedure Evaluation (Signed)
Anesthesia Post Note  Patient: Stacy Cook  Procedure(s) Performed: Procedure(s) (LRB): ESOPHAGOGASTRODUODENOSCOPY (EGD) WITH PROPOFOL (N/A)  Patient location during evaluation: PACU Anesthesia Type: General Level of consciousness: awake and alert and oriented Pain management: pain level controlled Vital Signs Assessment: post-procedure vital signs reviewed and stable Respiratory status: spontaneous breathing Cardiovascular status: blood pressure returned to baseline Anesthetic complications: no     Last Vitals:  Vitals:   09/25/16 1401 09/25/16 1421  BP: (!) 161/58 (!) 177/65  Pulse: 67 66  Resp: 20 12  Temp:      Last Pain:  Vitals:   09/25/16 1341  TempSrc: Tympanic                 Samik Balkcom

## 2016-09-25 NOTE — Anesthesia Post-op Follow-up Note (Cosign Needed)
Anesthesia QCDR form completed.        

## 2016-09-25 NOTE — Interval H&P Note (Signed)
History and Physical Interval Note:  09/25/2016 12:50 PM  Stacy Cook  has presented today for surgery, with the diagnosis of GASTRIC CA POST GASTR  The various methods of treatment have been discussed with the patient and family. After consideration of risks, benefits and other options for treatment, the patient has consented to  Procedure(s): ESOPHAGOGASTRODUODENOSCOPY (EGD) WITH PROPOFOL (N/A) as a surgical intervention .  The patient's history has been reviewed, patient examined, no change in status, stable for surgery.  I have reviewed the patient's chart and labs.  Questions were answered to the patient's satisfaction.     SANKAR,SEEPLAPUTHUR G

## 2016-09-25 NOTE — Op Note (Signed)
[facility] Gastroenterology Patient Name: Stacy Cook Procedure Date: 09/25/2016 1:02 PM MRN: [id number] Account #: [id number] Date of Birth: [birth year] Admit Type: Outpatient Age: 81 Room: [facility] ENDO ROOM 1 Gender: Female Note Status: Finalized Procedure:            Upper GI endoscopy Indications:          Assessment following Billroth I Providers:            Seeplaputhur G. Jamal Collin, MD Referring MD:         Ramonita Lab, MD (Referring MD) Medicines:            General Anesthesia Complications:        No immediate complications. Procedure:            Pre-Anesthesia Assessment:                       - General anesthesia under the supervision of an                        anesthesiologist was determined to be medically                        necessary for this procedure based on review of the                        patient's medical history, medications, and prior                        anesthesia history.                       After obtaining informed consent, the endoscope was                        passed under direct vision. Throughout the procedure,                        the patient's blood pressure, pulse, and oxygen                        saturations were monitored continuously. The Endoscope                        was introduced through the mouth, and advanced to the                        gastric stoma. The upper GI endoscopy was accomplished                        without difficulty. The patient tolerated the procedure                        well. Findings:      The esophagus was normal.      Scattered moderate inflammation characterized by erythema was found in       the stomach. Biopsies were taken with a cold forceps for histology.      Evidence of a Billroth I anastomosis was found in the anastomosis. This       was characterized by friable mucosa.      The exam was otherwise without abnormality. Impression:           -  Normal esophagus.           - Gastritis. Biopsied.                       - A Billroth I anastomosis was found, characterized by                        friable mucosa.                       - The examination was otherwise normal. Recommendation:       - Await pathology results.                       - Recommend a cytoprotection medication. Procedure Code(s):    --- Professional ---                       [phone], 1, Esophagogastroduodenoscopy, flexible,                        transoral; with biopsy, single or multiple Diagnosis Code(s):    --- Professional ---                       K29.70, Gastritis, unspecified, without bleeding                       Z09, Encounter for follow-up examination after                        completed treatment for conditions other than malignant                        neoplasm CPT copyright 2016 American Medical Association. All rights reserved. The codes documented in this report are preliminary and upon coder review may  be revised to meet current compliance requirements. Christene Lye, MD 09/25/2016 1:46:10 PM This report has been signed electronically. Number of Addenda: 0 Note Initiated On: 09/25/2016 1:02 PM      [facility]

## 2016-09-25 NOTE — Anesthesia Preprocedure Evaluation (Signed)
Anesthesia Evaluation  Patient identified by MRN, date of birth, ID band Patient awake    Reviewed: Allergy & Precautions, NPO status , Patient's Chart, lab work & pertinent test results  History of Anesthesia Complications Negative for: history of anesthetic complications  Airway Mallampati: III  TM Distance: >3 FB Neck ROM: Full    Dental   Pulmonary neg shortness of breath, asthma (childhood asthma) , neg sleep apnea, neg COPD, neg recent URI,  S/p lobectomy on the right   breath sounds clear to auscultation- rhonchi (-) wheezing      Cardiovascular Exercise Tolerance: Good hypertension, Pt. on medications (-) angina+ CAD (nonocclusive)  (-) Past MI, (-) Cardiac Stents and (-) CABG (-) dysrhythmias + Valvular Problems/Murmurs  Rhythm:Regular Rate:Normal - Systolic murmurs and - Diastolic murmurs NM stress test 04/15/15: No evidence of ischemia  Echo 04/15/15: NORMAL LEFT VENTRICULAR SYSTOLIC FUNCTION WITH AN ESTIMATED EF = >55 % NORMAL RIGHT VENTRICULAR SYSTOLIC FUNCTION MILD TRICUSPID, MITRAL AND AORTIC VALVE INSUFFICIENCY MODERATE AORTIC VALVE STENOSIS WITH A CALCULATED AVA = 0.82 cm^2 BY CONTINUITY EQUATION MODERATE LVH   Neuro/Psych negative neurological ROS  negative psych ROS   GI/Hepatic Neg liver ROS, GERD  ,  Endo/Other  diabetes, Oral Hypoglycemic Agents  Renal/GU negative Renal ROS     Musculoskeletal  (+) Arthritis ,   Abdominal (+) - obese,   Peds  Hematology  (+) anemia ,   Anesthesia Other Findings Past Medical History: No date: Anemia No date: Arthritis     Comment: back 2016: Basal cell carcinoma of skin 05/09/2015: Cancer of right lung (HCC)     Comment: Dr. Genevive Bi performed Right lower lobe lobectomy. No date: Cataract No date: Diabetes mellitus without complication (HCC) No date: Hypertension No date: Murmur No date: Osteoporosis No date: Stomach tumor (benign)   Reproductive/Obstetrics negative OB ROS                             Anesthesia Physical  Anesthesia Plan  ASA: III  Anesthesia Plan: General   Post-op Pain Management:    Induction: Intravenous  PONV Risk Score and Plan: 3 and Propofol  Airway Management Planned: Natural Airway and Nasal Cannula  Additional Equipment:   Intra-op Plan:   Post-operative Plan:   Informed Consent: I have reviewed the patients History and Physical, chart, labs and discussed the procedure including the risks, benefits and alternatives for the proposed anesthesia with the patient or authorized representative who has indicated his/her understanding and acceptance.   Dental advisory given  Plan Discussed with: CRNA and Anesthesiologist  Anesthesia Plan Comments:         Anesthesia Quick Evaluation

## 2016-09-25 NOTE — H&P (View-Only) (Signed)
Patient ID: Stacy Cook, female   DOB: 07-12-1935, 81 y.o.   MRN: [id number]  Chief Complaint  Patient presents with  . Routine Post Op    HPI Stacy Cook is a 81 y.o. female.  Here today for postoperative visit, she states she is doing well. She was having nausea and the nausea tablet was not helping so she stopped all her medications. Blood sugars are average 100-110. She is not nauseated, only weak. Denies any gastrointestinal issues, bowels are moving once a week.  HPI  Past Medical History:  Diagnosis Date  . Anemia   . Arthritis    back  . Basal cell carcinoma of skin 2016  . Cancer of right lung ([place]) 05/09/2015   Dr. Genevive Bi performed Right lower lobe lobectomy.   . Cataract   . Diabetes mellitus without complication ([place])   . GERD (gastroesophageal reflux disease)   . Hypertension   . Murmur   . Osteoporosis   . Stomach tumor (benign)     Past Surgical History:  Procedure Laterality Date  . ABDOMINAL HYSTERECTOMY    . APPENDECTOMY    . ESOPHAGOGASTRODUODENOSCOPY (EGD) WITH PROPOFOL N/A 03/27/2016   Procedure: ESOPHAGOGASTRODUODENOSCOPY (EGD) WITH PROPOFOL;  Surgeon: Lollie Sails, MD;  Location: [facility] ENDOSCOPY;  Service: Endoscopy;  Laterality: N/A;  . ESOPHAGOGASTRODUODENOSCOPY (EGD) WITH PROPOFOL N/A 05/28/2016   Procedure: ESOPHAGOGASTRODUODENOSCOPY (EGD) WITH PROPOFOL;  Surgeon: Lollie Sails, MD;  Location: [facility] ENDOSCOPY;  Service: Endoscopy;  Laterality: N/A;  . PARTIAL GASTRECTOMY N/A 08/03/2016   Procedure: PARTIAL GASTRECTOMY;  Surgeon: Christene Lye, MD;  Location: [facility] ORS;  Service: General;  Laterality: N/A;  . SHOULDER ARTHROSCOPY W/ CAPSULAR REPAIR Right   . THORACOTOMY Right 05/09/2015   Procedure: THORACOTOMY, RIGHT LOWER LOBECTOMY, BRONCHOSCOPY;  Surgeon: Nestor Lewandowsky, MD;  Location: [facility] ORS;  Service: Thoracic;  Laterality: Right;  . UPPER GI ENDOSCOPY N/A 08/03/2016   Procedure: UPPER  ENDOSCOPY;  Surgeon: Christene Lye, MD;  Location: [facility] ORS;  Service: General;  Laterality: N/A;    Family History  Problem Relation Age of Onset  . Diabetes Other   . Breast cancer Neg Hx     Social History Social History  Substance Use Topics  . Smoking status: Never Smoker  . Smokeless tobacco: Never Used  . Alcohol use No    Allergies  Allergen Reactions  . Statins     Joint pain  . Sulfa Antibiotics Nausea And Vomiting  . Pneumococcal Vaccine Itching    Hives and fever    Current Outpatient Prescriptions  Medication Sig Dispense Refill  . amoxicillin-clarithromycin-lansoprazole (PREVPAC) combo pack Take by mouth 2 (two) times daily. Follow package directions. (Patient not taking: Reported on 09/12/2016) 1 kit 0  . aspirin 81 MG tablet Take 81 mg by mouth daily. Reported on 06/03/2015    . calcium carbonate (TUMS - DOSED IN MG ELEMENTAL CALCIUM) 500 MG chewable tablet Chew 2 tablets by mouth 2 (two) times daily as needed for indigestion or heartburn.    . cholecalciferol (VITAMIN D) 1000 UNITS tablet Take 1,000 Units by mouth daily.    . cyanocobalamin (V-R VITAMIN B-12) 500 MCG tablet Take 500 mcg by mouth daily.     Marland Kitchen losartan (COZAAR) 100 MG tablet Take 100 mg by mouth daily.     . metFORMIN (GLUCOPHAGE) 500 MG tablet Take 500 mg by mouth daily with breakfast.     . metoprolol succinate (TOPROL-XL) 25 MG 24 hr tablet Take 25  mg by mouth every evening.     . Omega-3 Fatty Acids (FISH OIL) 1000 MG CAPS Take 1,000 mg by mouth 2 (two) times daily.     . ondansetron (ZOFRAN) 8 MG tablet One every 8 hours as needed for nausea. (Patient not taking: Reported on 09/12/2016) 40 tablet 1  . oxyCODONE (OXY IR/ROXICODONE) 5 MG immediate release tablet Take 1 tablet (5 mg total) by mouth every 4 (four) hours as needed for moderate pain. (Patient not taking: Reported on 09/12/2016) 30 tablet 0  . Probiotic Product (PROBIOTIC DAILY PO) Take 1 tablet by mouth daily.     . tizanidine (ZANAFLEX) 2 MG capsule Take 2 mg by  mouth 2 (two) times daily.     No current facility-administered medications for this visit.     Review of Systems Review of Systems  Respiratory: Negative.   Cardiovascular: Negative.   Gastrointestinal: Positive for constipation.  Neurological: Positive for weakness.    Blood pressure (!) 146/58, pulse (!) 56, resp. rate 12, height _0  (1.575 m), weight 120 lb (54.4 kg), SpO2 98 %.  Physical Exam Physical Exam  Constitutional: She is oriented to person, place, and time. She appears well-developed and well-nourished.  Eyes: Conjunctivae are normal. No scleral icterus.  Neck: Neck supple.  Cardiovascular: Normal rate and regular rhythm.   Murmur heard.  Systolic murmur is present  Aortic Stenosis  Pulmonary/Chest: Breath sounds normal.  Abdominal: Soft. Bowel sounds are normal. There is no hepatomegaly.    Lymphadenopathy:    She has no cervical adenopathy.  Neurological: She is alert and oriented to person, place, and time.  Skin: Skin is warm and dry.  Psychiatric: She has a normal mood and affect. Her behavior is normal.    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 Endoscopy recommended for follow up at patient's convenience.  Stable exam    Plan    Schedule endoscopy. Follow up in office in 6 weeks.    HPI, Physical Exam, Assessment and Plan have been scribed under the direction and in the presence of Mckinley Jewel, MD  Karie Fetch, RN  The patient is scheduled for an Upper Endoscopy at [facility] on 09/25/16. They are aware to call the day before to get their arrival time. She will hold her Metformin the day of. The patient is aware of date and instructions.  Documented by Lesly Rubenstein LPN  I have completed the exam and reviewed the above documentation for accuracy and completeness.  I agree with the above.  Haematologist has been used and any  errors in dictation or transcription are unintentional.  Allessandra Bernardi G. Jamal Collin, M.D., F.A.C.S.   Junie Panning G 09/12/2016, 3:51 PM

## 2016-09-25 NOTE — Transfer of Care (Signed)
Immediate Anesthesia Transfer of Care Note  Patient: Stacy Cook  Procedure(s) Performed: Procedure(s): ESOPHAGOGASTRODUODENOSCOPY (EGD) WITH PROPOFOL (N/A)  Patient Location: Endoscopy Unit  Anesthesia Type:General  Level of Consciousness: sedated  Airway & Oxygen Therapy: Patient connected to nasal cannula oxygen  Post-op Assessment: Post -op Vital signs reviewed and stable  Post vital signs: stable  Last Vitals:  Vitals:   09/25/16 1231 09/25/16 1341  BP: (!) 167/45 (!) 120/52  Pulse: 62 71  Resp: 18 16  Temp: 36.8 C (!) 35.9 C    Last Pain:  Vitals:   09/25/16 1341  TempSrc: Tympanic         Complications: No apparent anesthesia complications

## 2016-09-25 NOTE — Telephone Encounter (Signed)
Nurse called from [facility] regarding a medication that was supposed to be prescribed to patient. She states it was a cytoprotection medication. Please advise. Thank you

## 2016-09-26 ENCOUNTER — Encounter: Payer: Self-pay | Admitting: General Surgery

## 2016-09-27 LAB — SURGICAL PATHOLOGY

## 2016-10-01 ENCOUNTER — Encounter: Payer: Self-pay | Admitting: General Surgery

## 2016-10-01 ENCOUNTER — Ambulatory Visit (INDEPENDENT_AMBULATORY_CARE_PROVIDER_SITE_OTHER): Payer: Medicare Other | Admitting: General Surgery

## 2016-10-01 VITALS — BP 116/68 | HR 72 | Resp 12 | Ht 62.0 in | Wt 119.0 lb

## 2016-10-01 DIAGNOSIS — K2990 Gastroduodenitis, unspecified, without bleeding: Secondary | ICD-10-CM

## 2016-10-01 DIAGNOSIS — C160 Malignant neoplasm of cardia: Secondary | ICD-10-CM

## 2016-10-01 DIAGNOSIS — K2970 Gastritis, unspecified, without bleeding: Secondary | ICD-10-CM

## 2016-10-01 MED ORDER — SUCRALFATE 1 G PO TABS: 1.0000 g | ORAL_TABLET | Freq: Three times a day (TID) | ORAL | 3 refills | Status: DC

## 2016-10-01 NOTE — Progress Notes (Addendum)
Patient ID: Stacy Cook, female   DOB: 1935/08/04, 81 y.o.   MRN: [id number]  Chief Complaint  Patient presents with  . Other    HPI Stacy Cook is a 81 y.o. female here today for her post op endoscopy done on 09/25/2016. Patient states she is doing well.   HPI  Past Medical History:  Diagnosis Date  . Anemia   . Arthritis    back  . Basal cell carcinoma of skin 2016  . Cancer of right lung ([place]) 05/09/2015   Dr. Genevive Bi performed Right lower lobe lobectomy.   . Cataract   . Diabetes mellitus without complication ([place])   . GERD (gastroesophageal reflux disease)   . Hypertension   . Murmur   . Osteoporosis   . Stomach tumor (benign)     Past Surgical History:  Procedure Laterality Date  . ABDOMINAL HYSTERECTOMY    . APPENDECTOMY    . ESOPHAGOGASTRODUODENOSCOPY (EGD) WITH PROPOFOL N/A 03/27/2016   Procedure: ESOPHAGOGASTRODUODENOSCOPY (EGD) WITH PROPOFOL;  Surgeon: Lollie Sails, MD;  Location: [facility] ENDOSCOPY;  Service: Endoscopy;  Laterality: N/A;  . ESOPHAGOGASTRODUODENOSCOPY (EGD) WITH PROPOFOL N/A 05/28/2016   Procedure: ESOPHAGOGASTRODUODENOSCOPY (EGD) WITH PROPOFOL;  Surgeon: Lollie Sails, MD;  Location: [facility] ENDOSCOPY;  Service: Endoscopy;  Laterality: N/A;  . ESOPHAGOGASTRODUODENOSCOPY (EGD) WITH PROPOFOL N/A 09/25/2016   Procedure: ESOPHAGOGASTRODUODENOSCOPY (EGD) WITH PROPOFOL;  Surgeon: Christene Lye, MD;  Location: [facility] ENDOSCOPY;  Service: Endoscopy;  Laterality: N/A;  . PARTIAL GASTRECTOMY N/A 08/03/2016   Procedure: PARTIAL GASTRECTOMY;  Surgeon: Christene Lye, MD;  Location: [facility] ORS;  Service: General;  Laterality: N/A;  . SHOULDER ARTHROSCOPY W/ CAPSULAR REPAIR Right   . THORACOTOMY Right 05/09/2015   Procedure: THORACOTOMY, RIGHT LOWER LOBECTOMY, BRONCHOSCOPY;  Surgeon: Nestor Lewandowsky, MD;  Location: [facility] ORS;  Service: Thoracic;  Laterality: Right;  . UPPER GI ENDOSCOPY N/A 08/03/2016   Procedure: UPPER  ENDOSCOPY;  Surgeon:  Christene Lye, MD;  Location: [facility] ORS;  Service: General;  Laterality: N/A;    Family History  Problem Relation Age of Onset  . Diabetes Other   . Breast cancer Neg Hx     Social History Social History  Substance Use Topics  . Smoking status: Never Smoker  . Smokeless tobacco: Never Used  . Alcohol use No    Allergies  Allergen Reactions  . Statins     Joint pain  . Sulfa Antibiotics Nausea And Vomiting  . Pneumococcal Vaccine Itching    Hives and fever    Current Outpatient Prescriptions  Medication Sig Dispense Refill  . amoxicillin-clarithromycin-lansoprazole (PREVPAC) combo pack Take by mouth 2 (two) times daily. Follow package directions. (Patient not taking: Reported on 09/12/2016) 1 kit 0  . aspirin 81 MG tablet Take 81 mg by mouth daily. Reported on 06/03/2015    . calcium carbonate (TUMS - DOSED IN MG ELEMENTAL CALCIUM) 500 MG chewable tablet Chew 2 tablets by mouth 2 (two) times daily as needed for indigestion or heartburn.    . cholecalciferol (VITAMIN D) 1000 UNITS tablet Take 1,000 Units by mouth daily.    . cyanocobalamin (V-R VITAMIN B-12) 500 MCG tablet Take 500 mcg by mouth daily.     Marland Kitchen losartan (COZAAR) 100 MG tablet Take 100 mg by mouth daily.     . metFORMIN (GLUCOPHAGE) 500 MG tablet Take 500 mg by mouth daily with breakfast.     . metoprolol succinate (TOPROL-XL) 25 MG 24 hr tablet Take 25 mg by mouth every  evening.     . Omega-3 Fatty Acids (FISH OIL) 1000 MG CAPS Take 1,000 mg by mouth 2 (two) times daily.     . ondansetron (ZOFRAN) 8 MG tablet One every 8 hours as needed for nausea. (Patient not taking: Reported on 09/12/2016) 40 tablet 1  . oxyCODONE (OXY IR/ROXICODONE) 5 MG immediate release tablet Take 1 tablet (5 mg total) by mouth every 4 (four) hours as needed for moderate pain. (Patient not taking: Reported on 09/12/2016) 30 tablet 0  . Probiotic Product (PROBIOTIC DAILY PO) Take 1 tablet by mouth daily.     . sucralfate (CARAFATE) 1 g tablet  Take 1 tablet (1 g total) by mouth 4 (four) times daily -  with meals and at bedtime. 90 tablet 3  . tizanidine (ZANAFLEX) 2 MG capsule Take 2 mg by mouth 2 (two) times daily.     No current facility-administered medications for this visit.     Review of Systems Review of Systems  Constitutional: Negative.   Respiratory: Negative.   Cardiovascular: Negative.     Blood pressure 116/68, pulse 72, resp. rate 12, height 5' 2"  (1.575 m), weight 119 lb (54 kg).  Physical Exam Physical Exam  Constitutional: She is oriented to person, place, and time. She appears well-developed and well-nourished.  Pulmonary/Chest: Effort normal.  Neurological: She is alert and oriented to person, place, and time.  Skin: Skin is warm and dry.  Psychiatric: She has a normal mood and affect. Her behavior is normal.    Data Reviewed Prior notes and endoscopy/pathology results reviewed. Endoscopy showed areas of inflammation suggesting unspecified gastritis.  Assessment    History of signet ring adenocarcinoma of distal stomach- T1N0, pt is s/p partial gastrectomy. Doing well overall Gastritis likely due to bile reflux.  Gastric outlet is also narrowed but pt does not show signs of obstruction. Outlet friable, would prefer to wait till repeat endoscopy to determine if pt needs dilation.  All of this discussed fully with pt.    Plan   Treat with Carafate 1 mg TID, return in 2-3 mos for repeat endoscopy. Patient to stick with soft diet till follow up. Advised to call office with questions or concerns.     HPI, Physical Exam, Assessment and Plan have been scribed under the direction and in the presence of Mckinley Jewel, MD  Gaspar Cola, CMA  I have completed the exam and reviewed the above documentation for accuracy and completeness.  I agree with the above.  Haematologist has been used and any errors in dictation or transcription are unintentional.  Shelita Steptoe G. Jamal Collin, M.D.,  F.A.C.S.   Junie Panning Darnell Level 10/01/2016, 1:22 PM

## 2016-10-01 NOTE — Patient Instructions (Signed)
Treat with Carafate 1 mg TID, return in 2-3 mos for repeat endoscopy. Advised to call office with questions or concerns.

## 2016-10-24 ENCOUNTER — Ambulatory Visit: Payer: Medicare Other | Admitting: General Surgery

## 2016-10-31 ENCOUNTER — Other Ambulatory Visit
Admission: RE | Admit: 2016-10-31 | Discharge: 2016-10-31 | Disposition: A | Discharge status: Home / Self Care | Payer: Medicare Other | Source: Ambulatory Visit | Attending: Internal Medicine | Admitting: Internal Medicine

## 2016-10-31 DIAGNOSIS — C3431 Malignant neoplasm of lower lobe, right bronchus or lung: Secondary | ICD-10-CM | POA: Diagnosis present

## 2016-10-31 LAB — CBC WITH DIFFERENTIAL/PLATELET
BASOS ABS: 0 10*3/uL (ref 0–0.1)
Basophils Relative: 1 %
Eosinophils Absolute: 0.4 10*3/uL (ref 0–0.7)
Eosinophils Relative: 8 %
HEMATOCRIT: 31.1 % — AB (ref 35.0–47.0)
HEMOGLOBIN: 10.8 g/dL — AB (ref 12.0–16.0)
LYMPHS ABS: 1.1 10*3/uL (ref 1.0–3.6)
LYMPHS PCT: 24 %
MCH: 30.5 pg (ref 26.0–34.0)
MCHC: 34.9 g/dL (ref 32.0–36.0)
MCV: 87.3 fL (ref 80.0–100.0)
Monocytes Absolute: 0.2 10*3/uL (ref 0.2–0.9)
Monocytes Relative: 5 %
NEUTROS ABS: 2.9 10*3/uL (ref 1.4–6.5)
NEUTROS PCT: 62 %
PLATELETS: 101 10*3/uL — AB (ref 150–440)
RBC: 3.56 MIL/uL — AB (ref 3.80–5.20)
RDW: 12.2 % (ref 11.5–14.5)
WBC: 4.7 10*3/uL (ref 3.6–11.0)

## 2016-10-31 LAB — FIBRIN DERIVATIVES D-DIMER (ARMC ONLY): Fibrin derivatives D-dimer (ARMC): 495.98 (ref 0.00–499.00)

## 2016-10-31 LAB — BASIC METABOLIC PANEL
ANION GAP: 8 (ref 5–15)
BUN: 19 mg/dL (ref 6–20)
CHLORIDE: 107 mmol/L (ref 101–111)
CO2: 28 mmol/L (ref 22–32)
Calcium: 9.5 mg/dL (ref 8.9–10.3)
Creatinine, Ser: 1.22 mg/dL — ABNORMAL HIGH (ref 0.44–1.00)
GFR calc Af Amer: 48 mL/min — ABNORMAL LOW (ref >60)
GFR, EST NON AFRICAN AMERICAN: 41 mL/min — AB (ref >60)
Glucose, Bld: 91 mg/dL (ref 65–99)
POTASSIUM: 4.2 mmol/L (ref 3.5–5.1)
SODIUM: 143 mmol/L (ref 135–145)

## 2016-11-01 ENCOUNTER — Other Ambulatory Visit: Payer: Self-pay | Admitting: *Deleted

## 2016-11-01 ENCOUNTER — Encounter: Payer: Self-pay | Admitting: *Deleted

## 2016-11-01 DIAGNOSIS — D002 Carcinoma in situ of stomach: Secondary | ICD-10-CM

## 2016-12-04 ENCOUNTER — Ambulatory Visit (INDEPENDENT_AMBULATORY_CARE_PROVIDER_SITE_OTHER): Payer: Medicare Other | Admitting: General Surgery

## 2016-12-04 ENCOUNTER — Encounter: Payer: Self-pay | Admitting: General Surgery

## 2016-12-04 VITALS — BP 158/66 | HR 54 | Resp 12 | Ht 61.0 in | Wt 113.0 lb

## 2016-12-04 DIAGNOSIS — C3431 Malignant neoplasm of lower lobe, right bronchus or lung: Secondary | ICD-10-CM

## 2016-12-04 DIAGNOSIS — C160 Malignant neoplasm of cardia: Secondary | ICD-10-CM

## 2016-12-04 NOTE — Patient Instructions (Addendum)
The patient is aware to call back for any questions or concerns.  Upper Endoscopy Upper endoscopy is a procedure to look inside the upper GI (gastrointestinal) tract. The upper GI tract is made up of:  The tube that carries food and liquid from your throat to your stomach (esophagus).  The stomach.  The first part of your small intestine (duodenum).  This procedure is also called esophagogastroduodenoscopy (EGD) or gastroscopy. In this procedure, your health care provider passes a thin, flexible tube (endoscope) through your mouth and down your esophagus into your stomach. A small camera is attached to the end of the tube. Images from the camera appear on a monitor in the exam room. During this procedure, your health care provider may also remove a small piece of tissue to be sent to a lab and examined under a microscope (biopsy). Your health care provider may do an upper endoscopy to diagnose cancers of the upper GI tract. You may also have this procedure to find the cause of other conditions, such as:  Stomach pain.  Heartburn.  Pain or problems when swallowing.  Nausea and vomiting.  Stomach bleeding.  Stomach ulcers.  Tell a health care provider about:  Any allergies you have.  All medicines you are taking, including vitamins, herbs, eye drops, creams, and over-the-counter medicines.  Any problems you or family members have had with anesthetic medicines.  Any blood disorders you have.  Any surgeries you have had.  Any medical conditions you have.  Whether you are pregnant or may be pregnant. What are the risks? Generally, this is a safe procedure. However, problems may occur, including:  Infection.  Bleeding.  Allergic reactions to medicines.  A tear or hole (perforation) in the esophagus, stomach, or duodenum.  What happens before the procedure?  Follow instructions from your health care provider about eating or drinking restrictions.  Ask your health care  provider about changing or stopping your regular medicines. This is especially important if you are taking diabetes medicines or blood thinners.  Plan to have someone take you home after the procedure.  If you go home right after the procedure, plan to have someone with you for 24 hours. What happens during the procedure?  An IV tube will be inserted into one of your veins.  Your throat may be sprayed with medicine that numbs the area (local anesthetic).  You may be given a medicine to help you relax (sedative).  You will lie on your left side.  Your health care provider will pass the endoscope through your mouth and down your esophagus.  Your provider will use the scope to check the inside of your esophagus, stomach, and duodenum. Biopsies may be taken. The procedure may vary among health care providers and hospitals. What happens after the procedure?  Do not drive for 24 hours if you received a sedative.  Your blood pressure, heart rate, breathing rate, and blood oxygen level will be monitored often until the medicines you were given have worn off.  When your throat is no longer numb, you may be given some fluids to drink.  It is your responsibility to get the results of your procedure. Ask your health care provider or the department performing the procedure when your results will be ready. This information is not intended to replace advice given to you by your health care provider. Make sure you discuss any questions you have with your health care provider. Document Released: 03/23/2000 Document Revised: 09/06/2015 Document Reviewed: 01/06/2015 Elsevier  Interactive Patient Education  2017 [employer].  The patient is scheduled for an Upper Endoscopy at [facility] on 12/18/16. They are aware to call the day before to get their arrival time. She will only take her Losartan the morning of by 8:00 am. The patient is aware of date and instructions.

## 2016-12-04 NOTE — Progress Notes (Signed)
Patient ID: Stacy Cook, female   DOB: 1935-06-17, 81 y.o.   MRN: [id number]  Chief Complaint  Patient presents with  . Follow-up    HPI Stacy Cook is a 81 y.o. female.  Here for follow up gastric cancer. She states that her stomach starts hurting about 30 minutes after she eats and last for about an hour only with lunch and dinner. Bowels move every 3-4 days. Endoscopy was done on 09/25/2016. Weight is down 6 pounds from June.  HPI  Past Medical History:  Diagnosis Date  . Anemia   . Arthritis    back  . Basal cell carcinoma of skin 2016  . Cancer of right lung ([place]) 05/09/2015   Dr. Genevive Bi performed Right lower lobe lobectomy.   . Cataract   . Diabetes mellitus without complication ([place])   . GERD (gastroesophageal reflux disease)   . Hypertension   . Murmur   . Osteoporosis   . Stomach tumor (benign)     Past Surgical History:  Procedure Laterality Date  . ABDOMINAL HYSTERECTOMY    . APPENDECTOMY    . ESOPHAGOGASTRODUODENOSCOPY (EGD) WITH PROPOFOL N/A 03/27/2016   Procedure: ESOPHAGOGASTRODUODENOSCOPY (EGD) WITH PROPOFOL;  Surgeon: Lollie Sails, MD;  Location: [facility] ENDOSCOPY;  Service: Endoscopy;  Laterality: N/A;  . ESOPHAGOGASTRODUODENOSCOPY (EGD) WITH PROPOFOL N/A 05/28/2016   Procedure: ESOPHAGOGASTRODUODENOSCOPY (EGD) WITH PROPOFOL;  Surgeon: Lollie Sails, MD;  Location: [facility] ENDOSCOPY;  Service: Endoscopy;  Laterality: N/A;  . ESOPHAGOGASTRODUODENOSCOPY (EGD) WITH PROPOFOL N/A 09/25/2016   Procedure: ESOPHAGOGASTRODUODENOSCOPY (EGD) WITH PROPOFOL;  Surgeon: Christene Lye, MD;  Location: [facility] ENDOSCOPY;  Service: Endoscopy;  Laterality: N/A;  . PARTIAL GASTRECTOMY N/A 08/03/2016   Procedure: PARTIAL GASTRECTOMY;  Surgeon: Christene Lye, MD;  Location: [facility] ORS;  Service: General;  Laterality: N/A;  . SHOULDER ARTHROSCOPY W/ CAPSULAR REPAIR Right   . THORACOTOMY Right 05/09/2015   Procedure: THORACOTOMY, RIGHT LOWER LOBECTOMY,  BRONCHOSCOPY;  Surgeon: Nestor Lewandowsky, MD;  Location: [facility] ORS;  Service: Thoracic;  Laterality: Right;  . UPPER GI ENDOSCOPY N/A 08/03/2016   Procedure: UPPER  ENDOSCOPY;  Surgeon: Christene Lye, MD;  Location: [facility] ORS;  Service: General;  Laterality: N/A;    Family History  Problem Relation Age of Onset  . Diabetes Other   . Breast cancer Neg Hx     Social History Social History  Substance Use Topics  . Smoking status: Never Smoker  . Smokeless tobacco: Never Used  . Alcohol use No    Allergies  Allergen Reactions  . Statins     Joint pain  . Sulfa Antibiotics Nausea And Vomiting  . Pneumococcal Vaccine Itching    Hives and fever    Current Outpatient Prescriptions  Medication Sig Dispense Refill  . aspirin 81 MG tablet Take 81 mg by mouth daily. Reported on 06/03/2015    . calcium carbonate (TUMS - DOSED IN MG ELEMENTAL CALCIUM) 500 MG chewable tablet Chew 2 tablets by mouth 2 (two) times daily as needed for indigestion or heartburn.    . cholecalciferol (VITAMIN D) 1000 UNITS tablet Take 1,000 Units by mouth daily.    . cyanocobalamin (V-R VITAMIN B-12) 500 MCG tablet Take 500 mcg by mouth daily.     Marland Kitchen losartan (COZAAR) 100 MG tablet Take 100 mg by mouth daily.     . Omega-3 Fatty Acids (FISH OIL) 1000 MG CAPS Take 1,000 mg by mouth 2 (two) times daily.     . Probiotic Product (PROBIOTIC DAILY  PO) Take 1 tablet by mouth daily.     . sucralfate (CARAFATE) 1 g tablet Take 1 tablet (1 g total) by mouth 4 (four) times daily -  with meals and at bedtime. 90 tablet 3  . tizanidine (ZANAFLEX) 2 MG capsule Take 2 mg by mouth 2 (two) times daily.    . metFORMIN (GLUCOPHAGE) 500 MG tablet Take 500 mg by mouth daily with breakfast.     . metoprolol succinate (TOPROL-XL) 25 MG 24 hr tablet Take 25 mg by mouth every evening.      No current facility-administered medications for this visit.     Review of Systems Review of Systems  Constitutional: Negative.   Respiratory:  Negative.   Cardiovascular: Negative.   Gastrointestinal: Negative for constipation and diarrhea.    Blood pressure (!) 158/66, pulse (!) 54, resp. rate 12, height 5\' 1"  (1.549 m), weight 113 lb (51.3 kg).  Physical Exam Physical Exam  Constitutional: She is oriented to person, place, and time. She appears well-developed and well-nourished.  HENT:  Mouth/Throat: Oropharynx is clear and moist.  Eyes: Conjunctivae are normal. No scleral icterus.  Neck: Neck supple.  Cardiovascular: Normal rate and regular rhythm.   Murmur heard.  Systolic murmur is present with a grade of 4/6  Pulmonary/Chest: Effort normal and breath sounds normal.  Abdominal: Soft. Bowel sounds are normal. There is no tenderness.  Lymphadenopathy:    She has no cervical adenopathy.  Neurological: She is alert and oriented to person, place, and time.  Skin: Skin is warm and dry.  Psychiatric: Her behavior is normal.    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  Doing well overall, tolerating carafate. Endoscopy 2 mos ago showed outlet was small and findings of moderate bile gastritis. No retained food . Pt has been on Carafate since then.     Plan    Schedule upper endoscopy to assess both the gastritis and the outlet Discussed with pt and she is agreeable.    HPI, Physical Exam, Assessment and Plan have been scribed under the direction and in the presence of Mckinley Jewel, MD Karie Fetch, RN  The patient is scheduled for an Upper Endoscopy at [facility] on 12/18/16. They are aware to call the day before to get their arrival time. She will only take her Losartan the morning of by 8:00 am. The patient is aware of date and instructions.  Documented by Lesly Rubenstein LPN  I have completed the exam and reviewed the above documentation for accuracy and completeness.  I agree with the above.   Haematologist has been used and any errors in dictation or transcription are unintentional.  Seeplaputhur G. Jamal Collin, M.D., F.A.C.S.   Junie Panning G 12/04/2016, 9:02 AM

## 2016-12-18 ENCOUNTER — Encounter
Admission: RE | Disposition: A | Discharge status: Home / Self Care | Payer: Self-pay | Source: Ambulatory Visit | Attending: General Surgery

## 2016-12-18 ENCOUNTER — Encounter: Payer: Self-pay | Admitting: Anesthesiology

## 2016-12-18 ENCOUNTER — Ambulatory Visit: Payer: Medicare Other | Admitting: Anesthesiology

## 2016-12-18 ENCOUNTER — Ambulatory Visit
Admission: RE | Admit: 2016-12-18 | Discharge: 2016-12-18 | Disposition: A | Discharge status: Home / Self Care | Payer: Medicare Other | Source: Ambulatory Visit | Attending: General Surgery | Admitting: General Surgery

## 2016-12-18 DIAGNOSIS — E119 Type 2 diabetes mellitus without complications: Secondary | ICD-10-CM | POA: Insufficient documentation

## 2016-12-18 DIAGNOSIS — Z85118 Personal history of other malignant neoplasm of bronchus and lung: Secondary | ICD-10-CM | POA: Diagnosis not present

## 2016-12-18 DIAGNOSIS — T182XXA Foreign body in stomach, initial encounter: Secondary | ICD-10-CM | POA: Insufficient documentation

## 2016-12-18 DIAGNOSIS — Z7984 Long term (current) use of oral hypoglycemic drugs: Secondary | ICD-10-CM | POA: Insufficient documentation

## 2016-12-18 DIAGNOSIS — X58XXXA Exposure to other specified factors, initial encounter: Secondary | ICD-10-CM | POA: Insufficient documentation

## 2016-12-18 DIAGNOSIS — K219 Gastro-esophageal reflux disease without esophagitis: Secondary | ICD-10-CM | POA: Diagnosis not present

## 2016-12-18 DIAGNOSIS — Y929 Unspecified place or not applicable: Secondary | ICD-10-CM | POA: Diagnosis not present

## 2016-12-18 DIAGNOSIS — Z79899 Other long term (current) drug therapy: Secondary | ICD-10-CM | POA: Diagnosis not present

## 2016-12-18 DIAGNOSIS — Z7982 Long term (current) use of aspirin: Secondary | ICD-10-CM | POA: Insufficient documentation

## 2016-12-18 DIAGNOSIS — K2970 Gastritis, unspecified, without bleeding: Secondary | ICD-10-CM | POA: Diagnosis not present

## 2016-12-18 DIAGNOSIS — K9189 Other postprocedural complications and disorders of digestive system: Secondary | ICD-10-CM | POA: Diagnosis not present

## 2016-12-18 DIAGNOSIS — I10 Essential (primary) hypertension: Secondary | ICD-10-CM | POA: Diagnosis not present

## 2016-12-18 DIAGNOSIS — K3189 Other diseases of stomach and duodenum: Secondary | ICD-10-CM | POA: Insufficient documentation

## 2016-12-18 DIAGNOSIS — Z85828 Personal history of other malignant neoplasm of skin: Secondary | ICD-10-CM | POA: Diagnosis not present

## 2016-12-18 DIAGNOSIS — C160 Malignant neoplasm of cardia: Secondary | ICD-10-CM

## 2016-12-18 HISTORY — PX: ESOPHAGOGASTRODUODENOSCOPY (EGD) WITH PROPOFOL: SHX5813

## 2016-12-18 SURGERY — ESOPHAGOGASTRODUODENOSCOPY (EGD) WITH PROPOFOL
Anesthesia: General

## 2016-12-18 MED ORDER — LIDOCAINE HCL (PF) 2 % IJ SOLN
INTRAMUSCULAR | Status: AC
  Filled 2016-12-18: qty 2

## 2016-12-18 MED ORDER — GLYCOPYRROLATE 0.2 MG/ML IJ SOLN
INTRAMUSCULAR | Status: AC
  Filled 2016-12-18: qty 1

## 2016-12-18 MED ORDER — PROPOFOL 10 MG/ML IV BOLUS
INTRAVENOUS | Status: AC
  Filled 2016-12-18: qty 20

## 2016-12-18 MED ORDER — SODIUM CHLORIDE 0.9 % IV SOLN
INTRAVENOUS | Status: DC
  Administered 2016-12-18: 13:00:00 via INTRAVENOUS

## 2016-12-18 MED ORDER — PROPOFOL 500 MG/50ML IV EMUL
INTRAVENOUS | Status: DC | PRN
  Administered 2016-12-18: 120 ug/kg/min via INTRAVENOUS

## 2016-12-18 MED ORDER — GLYCOPYRROLATE 0.2 MG/ML IJ SOLN
INTRAMUSCULAR | Status: DC | PRN
  Administered 2016-12-18: 0.2 mg via INTRAVENOUS

## 2016-12-18 MED ORDER — PROPOFOL 10 MG/ML IV BOLUS
INTRAVENOUS | Status: DC | PRN
  Administered 2016-12-18: 50 mg via INTRAVENOUS

## 2016-12-18 MED ORDER — LIDOCAINE HCL (CARDIAC) 20 MG/ML IV SOLN
INTRAVENOUS | Status: DC | PRN
  Administered 2016-12-18: 40 mg via INTRAVENOUS

## 2016-12-18 NOTE — Anesthesia Post-op Follow-up Note (Signed)
Anesthesia QCDR form completed.        

## 2016-12-18 NOTE — Transfer of Care (Signed)
Immediate Anesthesia Transfer of Care Note  Patient: Stacy Cook  Procedure(s) Performed: Procedure(s): ESOPHAGOGASTRODUODENOSCOPY (EGD) WITH PROPOFOL (N/A)  Patient Location: Endoscopy Unit  Anesthesia Type:General  Level of Consciousness: drowsy and patient cooperative  Airway & Oxygen Therapy: Patient Spontanous Breathing and Patient connected to nasal cannula oxygen  Post-op Assessment: Report given to RN and Post -op Vital signs reviewed and stable  Post vital signs: Reviewed and stable  Last Vitals:  Vitals:   12/18/16 1220 12/18/16 1324  BP: (!) 164/80 (!) 130/50  Pulse: 64   Resp: 18   Temp: 37 C (!) 35.6 C  SpO2: 100%     Last Pain:  Vitals:   12/18/16 1324  TempSrc: Tympanic         Complications: No apparent anesthesia complications

## 2016-12-18 NOTE — Anesthesia Preprocedure Evaluation (Signed)
Anesthesia Evaluation  Patient identified by MRN, date of birth, ID band Patient awake    Reviewed: Allergy & Precautions, NPO status , Patient's Chart, lab work & pertinent test results, reviewed documented beta blocker date and time   Airway Mallampati: II  TM Distance: >3 FB     Dental  (+) Chipped   Pulmonary           Cardiovascular hypertension, Pt. on medications and Pt. on home beta blockers + CAD and + Peripheral Vascular Disease       Neuro/Psych  Neuromuscular disease    GI/Hepatic   Endo/Other  diabetes, Type 2  Renal/GU      Musculoskeletal  (+) Arthritis ,   Abdominal   Peds  Hematology  (+) anemia ,   Anesthesia Other Findings Lung ca.  Reproductive/Obstetrics                             Anesthesia Physical Anesthesia Plan  ASA: III  Anesthesia Plan: General   Post-op Pain Management:    Induction: Intravenous  PONV Risk Score and Plan:   Airway Management Planned:   Additional Equipment:   Intra-op Plan:   Post-operative Plan:   Informed Consent: I have reviewed the patients History and Physical, chart, labs and discussed the procedure including the risks, benefits and alternatives for the proposed anesthesia with the patient or authorized representative who has indicated his/her understanding and acceptance.     Plan Discussed with: CRNA  Anesthesia Plan Comments:         Anesthesia Quick Evaluation

## 2016-12-18 NOTE — Op Note (Signed)
[facility] Gastroenterology Patient Name: Stacy Cook Procedure Date: 12/18/2016 12:49 PM MRN: [id number] Account #: [id number] Date of Birth: Jul 23, 1935 Admit Type: Outpatient Age: 81 Room: [facility] ENDO ROOM 1 Gender: Female Note Status: Finalized Procedure:            Upper GI endoscopy Indications:          Post-surgical anastomotic stenosis Providers:            Seeplaputhur G. Jamal Collin, MD Medicines:            Total IV Anesthesia (TIVA) Complications:        No immediate complications. Procedure:            Pre-Anesthesia Assessment:                       - Using IV propofol under the supervision of an                        anesthesiologist was determined to be medically                        necessary for this procedure based on review of the                        patient's medical history, medications, and prior                        anesthesia history.                       After obtaining informed consent, the endoscope was                        passed under direct vision. Throughout the procedure,                        the patient's blood pressure, pulse, and oxygen                        saturations were monitored continuously. The Endoscope                        was introduced through the mouth, and advanced to the                        second part of duodenum. The upper GI endoscopy was                        accomplished without difficulty. The patient tolerated                        the procedure well. Findings:      The esophagus was normal.      The examined duodenum was normal.      A medium amount of a phytobezoar was found in the gastric body.      Scattered moderate inflammation characterized by erythema and friability       was found in the stomach.      A benign-appearing, intrinsic moderate stenosis was found at the       anastomosis. This was traversed. A guide wire was placed, then the scope  was withdrawn. Using the wire as  a guide, dilation with a 14 mm pyloric       balloon dilator was performed. The dilation site was examined and showed       moderate improvement in luminal narrowing.      The exam was otherwise without abnormality. Impression:           - Normal esophagus.                       - Normal examined duodenum.                       - A medium amount of a phytobezoar in the stomach.                       - Gastritis.                       - Gastric stenosis was found at the anastomosis.                        Dilated.                       - The examination was otherwise normal.                       - No specimens collected. Recommendation:       - Discharge patient to home.                       - Resume previous diet.                       - Continue present medications. Procedure Code(s):    --- Professional ---                       [phone], Esophagogastroduodenoscopy, flexible, transoral;                        with dilation of gastric/duodenal stricture(s) (eg,                        balloon, bougie) Diagnosis Code(s):    --- Professional ---                       S06.3KZS, Foreign body in stomach, initial encounter                       K29.70, Gastritis, unspecified, without bleeding                       K31.89, Other diseases of stomach and duodenum                       K91.89, Other postprocedural complications and                        disorders of digestive system CPT copyright 2016 American Medical Association. All rights reserved. The codes documented in this report are preliminary and upon coder review may  be revised to meet current compliance requirements. Christene Lye, MD 12/18/2016 1:27:21 PM This report has been signed electronically.  Number of Addenda: 0 Note Initiated On: 12/18/2016 12:49 PM      [facility]

## 2016-12-18 NOTE — Brief Op Note (Signed)
12-23mm balloon dilator, filled to 14.32mm, used to dilate pyloris. Heme present. Retained food in stomach.

## 2016-12-18 NOTE — H&P (View-Only) (Signed)
Patient ID: Stacy Cook, female   DOB: January 26, 1936, 81 y.o.   MRN: [id number]  Chief Complaint  Patient presents with  . Follow-up    HPI Stacy Cook is a 81 y.o. female.  Here for follow up gastric cancer. She states that her stomach starts hurting about 30 minutes after she eats and last for about an hour only with lunch and dinner. Bowels move every 3-4 days. Endoscopy was done on 09/25/2016. Weight is down 6 pounds from June.  HPI  Past Medical History:  Diagnosis Date  . Anemia   . Arthritis    back  . Basal cell carcinoma of skin 2016  . Cancer of right lung ([place]) 05/09/2015   Dr. Genevive Bi performed Right lower lobe lobectomy.   . Cataract   . Diabetes mellitus without complication ([place])   . GERD (gastroesophageal reflux disease)   . Hypertension   . Murmur   . Osteoporosis   . Stomach tumor (benign)     Past Surgical History:  Procedure Laterality Date  . ABDOMINAL HYSTERECTOMY    . APPENDECTOMY    . ESOPHAGOGASTRODUODENOSCOPY (EGD) WITH PROPOFOL N/A 03/27/2016   Procedure: ESOPHAGOGASTRODUODENOSCOPY (EGD) WITH PROPOFOL;  Surgeon: Lollie Sails, MD;  Location: [facility] ENDOSCOPY;  Service: Endoscopy;  Laterality: N/A;  . ESOPHAGOGASTRODUODENOSCOPY (EGD) WITH PROPOFOL N/A 05/28/2016   Procedure: ESOPHAGOGASTRODUODENOSCOPY (EGD) WITH PROPOFOL;  Surgeon: Lollie Sails, MD;  Location: [facility] ENDOSCOPY;  Service: Endoscopy;  Laterality: N/A;  . ESOPHAGOGASTRODUODENOSCOPY (EGD) WITH PROPOFOL N/A 09/25/2016   Procedure: ESOPHAGOGASTRODUODENOSCOPY (EGD) WITH PROPOFOL;  Surgeon: Christene Lye, MD;  Location: [facility] ENDOSCOPY;  Service: Endoscopy;  Laterality: N/A;  . PARTIAL GASTRECTOMY N/A 08/03/2016   Procedure: PARTIAL GASTRECTOMY;  Surgeon: Christene Lye, MD;  Location: [facility] ORS;  Service: General;  Laterality: N/A;  . SHOULDER ARTHROSCOPY W/ CAPSULAR REPAIR Right   . THORACOTOMY Right 05/09/2015   Procedure: THORACOTOMY, RIGHT LOWER LOBECTOMY,  BRONCHOSCOPY;  Surgeon: Nestor Lewandowsky, MD;  Location: [facility] ORS;  Service: Thoracic;  Laterality: Right;  . UPPER GI ENDOSCOPY N/A 08/03/2016   Procedure: UPPER  ENDOSCOPY;  Surgeon: Christene Lye, MD;  Location: [facility] ORS;  Service: General;  Laterality: N/A;    Family History  Problem Relation Age of Onset  . Diabetes Other   . Breast cancer Neg Hx     Social History Social History  Substance Use Topics  . Smoking status: Never Smoker  . Smokeless tobacco: Never Used  . Alcohol use No    Allergies  Allergen Reactions  . Statins     Joint pain  . Sulfa Antibiotics Nausea And Vomiting  . Pneumococcal Vaccine Itching    Hives and fever    Current Outpatient Prescriptions  Medication Sig Dispense Refill  . aspirin 81 MG tablet Take 81 mg by mouth daily. Reported on 06/03/2015    . calcium carbonate (TUMS - DOSED IN MG ELEMENTAL CALCIUM) 500 MG chewable tablet Chew 2 tablets by mouth 2 (two) times daily as needed for indigestion or heartburn.    . cholecalciferol (VITAMIN D) 1000 UNITS tablet Take 1,000 Units by mouth daily.    . cyanocobalamin (V-R VITAMIN B-12) 500 MCG tablet Take 500 mcg by mouth daily.     Marland Kitchen losartan (COZAAR) 100 MG tablet Take 100 mg by mouth daily.     . Omega-3 Fatty Acids (FISH OIL) 1000 MG CAPS Take 1,000 mg by mouth 2 (two) times daily.     . Probiotic Product (PROBIOTIC DAILY  PO) Take 1 tablet by mouth daily.     . sucralfate (CARAFATE) 1 g tablet Take 1 tablet (1 g total) by mouth 4 (four) times daily -  with meals and at bedtime. 90 tablet 3  . tizanidine (ZANAFLEX) 2 MG capsule Take 2 mg by mouth 2 (two) times daily.    . metFORMIN (GLUCOPHAGE) 500 MG tablet Take 500 mg by mouth daily with breakfast.     . metoprolol succinate (TOPROL-XL) 25 MG 24 hr tablet Take 25 mg by mouth every evening.      No current facility-administered medications for this visit.     Review of Systems Review of Systems  Constitutional: Negative.   Respiratory:  Negative.   Cardiovascular: Negative.   Gastrointestinal: Negative for constipation and diarrhea.    Blood pressure (!) 158/66, pulse (!) 54, resp. rate 12, height 5\' 1"  (1.549 m), weight 113 lb (51.3 kg).  Physical Exam Physical Exam  Constitutional: She is oriented to person, place, and time. She appears well-developed and well-nourished.  HENT:  Mouth/Throat: Oropharynx is clear and moist.  Eyes: Conjunctivae are normal. No scleral icterus.  Neck: Neck supple.  Cardiovascular: Normal rate and regular rhythm.   Murmur heard.  Systolic murmur is present with a grade of 4/6  Pulmonary/Chest: Effort normal and breath sounds normal.  Abdominal: Soft. Bowel sounds are normal. There is no tenderness.  Lymphadenopathy:    She has no cervical adenopathy.  Neurological: She is alert and oriented to person, place, and time.  Skin: Skin is warm and dry.  Psychiatric: Her behavior is normal.    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  Doing well overall, tolerating carafate. Endoscopy 2 mos ago showed outlet was small and findings of moderate bile gastritis. No retained food . Pt has been on Carafate since then.     Plan    Schedule upper endoscopy to assess both the gastritis and the outlet Discussed with pt and she is agreeable.    HPI, Physical Exam, Assessment and Plan have been scribed under the direction and in the presence of Mckinley Jewel, MD Karie Fetch, RN  The patient is scheduled for an Upper Endoscopy at [facility] on 12/18/16. They are aware to call the day before to get their arrival time. She will only take her Losartan the morning of by 8:00 am. The patient is aware of date and instructions.  Documented by Lesly Rubenstein LPN  I have completed the exam and reviewed the above documentation for accuracy and completeness.  I agree with the above.   Haematologist has been used and any errors in dictation or transcription are unintentional.  Seeplaputhur G. Jamal Collin, M.D., F.A.C.S.   Junie Panning G 12/04/2016, 9:02 AM

## 2016-12-18 NOTE — Interval H&P Note (Signed)
History and Physical Interval Note:  12/18/2016 12:48 PM  Stacy Cook  has presented today for surgery, with the diagnosis of CA STOMACH  The various methods of treatment have been discussed with the patient and family. After consideration of risks, benefits and other options for treatment, the patient has consented to  Procedure(s): ESOPHAGOGASTRODUODENOSCOPY (EGD) WITH PROPOFOL (N/A) as a surgical intervention .  The patient's history has been reviewed, patient examined, no change in status, stable for surgery.  I have reviewed the patient's chart and labs.  Questions were answered to the patient's satisfaction.     Freeda Spivey G

## 2016-12-19 ENCOUNTER — Encounter: Payer: Self-pay | Admitting: General Surgery

## 2016-12-19 NOTE — Anesthesia Postprocedure Evaluation (Signed)
Anesthesia Post Note  Patient: Jalayne Ganesh Oborn  Procedure(s) Performed: Procedure(s) (LRB): ESOPHAGOGASTRODUODENOSCOPY (EGD) WITH PROPOFOL (N/A)  Patient location during evaluation: Endoscopy Anesthesia Type: General Level of consciousness: awake and alert Pain management: pain level controlled Vital Signs Assessment: post-procedure vital signs reviewed and stable Respiratory status: spontaneous breathing, nonlabored ventilation, respiratory function stable and patient connected to nasal cannula oxygen Cardiovascular status: blood pressure returned to baseline and stable Postop Assessment: no signs of nausea or vomiting Anesthetic complications: no     Last Vitals:  Vitals:   12/18/16 1324 12/18/16 1357  BP: (!) 130/50 (!) 175/67  Pulse:    Resp:    Temp: (!) 35.6 C   SpO2:      Last Pain:  Vitals:   12/18/16 1324  TempSrc: Tympanic                 Cassara Nida S

## 2016-12-21 ENCOUNTER — Inpatient Hospital Stay (HOSPITAL_BASED_OUTPATIENT_CLINIC_OR_DEPARTMENT_OTHER): Payer: Medicare Other | Admitting: Internal Medicine

## 2016-12-21 ENCOUNTER — Inpatient Hospital Stay: Payer: Medicare Other | Attending: Internal Medicine

## 2016-12-21 VITALS — BP 123/57 | HR 43 | Temp 98.1°F | Resp 16 | Wt 117.2 lb

## 2016-12-21 DIAGNOSIS — D693 Immune thrombocytopenic purpura: Secondary | ICD-10-CM | POA: Insufficient documentation

## 2016-12-21 DIAGNOSIS — D649 Anemia, unspecified: Secondary | ICD-10-CM | POA: Insufficient documentation

## 2016-12-21 DIAGNOSIS — Z79899 Other long term (current) drug therapy: Secondary | ICD-10-CM | POA: Insufficient documentation

## 2016-12-21 DIAGNOSIS — Z85828 Personal history of other malignant neoplasm of skin: Secondary | ICD-10-CM | POA: Diagnosis not present

## 2016-12-21 DIAGNOSIS — E119 Type 2 diabetes mellitus without complications: Secondary | ICD-10-CM | POA: Diagnosis not present

## 2016-12-21 DIAGNOSIS — M810 Age-related osteoporosis without current pathological fracture: Secondary | ICD-10-CM

## 2016-12-21 DIAGNOSIS — Z9049 Acquired absence of other specified parts of digestive tract: Secondary | ICD-10-CM | POA: Diagnosis not present

## 2016-12-21 DIAGNOSIS — M129 Arthropathy, unspecified: Secondary | ICD-10-CM

## 2016-12-21 DIAGNOSIS — I10 Essential (primary) hypertension: Secondary | ICD-10-CM | POA: Insufficient documentation

## 2016-12-21 DIAGNOSIS — Z7982 Long term (current) use of aspirin: Secondary | ICD-10-CM | POA: Insufficient documentation

## 2016-12-21 DIAGNOSIS — Z7984 Long term (current) use of oral hypoglycemic drugs: Secondary | ICD-10-CM

## 2016-12-21 DIAGNOSIS — K219 Gastro-esophageal reflux disease without esophagitis: Secondary | ICD-10-CM | POA: Insufficient documentation

## 2016-12-21 DIAGNOSIS — Z85118 Personal history of other malignant neoplasm of bronchus and lung: Secondary | ICD-10-CM | POA: Diagnosis not present

## 2016-12-21 DIAGNOSIS — R928 Other abnormal and inconclusive findings on diagnostic imaging of breast: Secondary | ICD-10-CM

## 2016-12-21 DIAGNOSIS — R011 Cardiac murmur, unspecified: Secondary | ICD-10-CM

## 2016-12-21 DIAGNOSIS — C3431 Malignant neoplasm of lower lobe, right bronchus or lung: Secondary | ICD-10-CM

## 2016-12-21 DIAGNOSIS — Z85028 Personal history of other malignant neoplasm of stomach: Secondary | ICD-10-CM

## 2016-12-21 DIAGNOSIS — D002 Carcinoma in situ of stomach: Secondary | ICD-10-CM

## 2016-12-21 LAB — CBC WITH DIFFERENTIAL/PLATELET
BASOS PCT: 1 %
Basophils Absolute: 0 10*3/uL (ref 0–0.1)
EOS ABS: 0.1 10*3/uL (ref 0–0.7)
Eosinophils Relative: 3 %
HEMATOCRIT: 27.8 % — AB (ref 35.0–47.0)
HEMOGLOBIN: 9.9 g/dL — AB (ref 12.0–16.0)
Lymphocytes Relative: 38 %
Lymphs Abs: 1.4 10*3/uL (ref 1.0–3.6)
MCH: 29.9 pg (ref 26.0–34.0)
MCHC: 35.5 g/dL (ref 32.0–36.0)
MCV: 84.3 fL (ref 80.0–100.0)
Monocytes Absolute: 0.2 10*3/uL (ref 0.2–0.9)
Monocytes Relative: 6 %
NEUTROS ABS: 1.8 10*3/uL (ref 1.4–6.5)
NEUTROS PCT: 52 %
Platelets: 89 10*3/uL — ABNORMAL LOW (ref 150–440)
RBC: 3.3 MIL/uL — AB (ref 3.80–5.20)
RDW: 12.8 % (ref 11.5–14.5)
WBC: 3.6 10*3/uL (ref 3.6–11.0)

## 2016-12-21 LAB — BASIC METABOLIC PANEL
ANION GAP: 8 (ref 5–15)
BUN: 18 mg/dL (ref 6–20)
CO2: 26 mmol/L (ref 22–32)
Calcium: 8.6 mg/dL — ABNORMAL LOW (ref 8.9–10.3)
Chloride: 105 mmol/L (ref 101–111)
Creatinine, Ser: 1.19 mg/dL — ABNORMAL HIGH (ref 0.44–1.00)
GFR calc non Af Amer: 43 mL/min — ABNORMAL LOW (ref >60)
GFR, EST AFRICAN AMERICAN: 49 mL/min — AB (ref >60)
Glucose, Bld: 97 mg/dL (ref 65–99)
POTASSIUM: 3.2 mmol/L — AB (ref 3.5–5.1)
Sodium: 139 mmol/L (ref 135–145)

## 2016-12-21 LAB — LACTATE DEHYDROGENASE: LDH: 123 U/L (ref 98–192)

## 2016-12-21 LAB — VITAMIN B12: VITAMIN B 12: 1020 pg/mL — AB (ref 180–914)

## 2016-12-21 NOTE — Progress Notes (Signed)
[place] NOTE  Patient Care Team: Adin Hector, MD as PCP - General (Internal Medicine) Cammie Sickle, MD as Consulting Physician (Internal Medicine) Christene Lye, MD (General Surgery)  CHIEF COMPLAINTS/PURPOSE OF CONSULTATION:   Oncology History   # DEC 2017- Adeno ca [GATA; her 2 Neu-NEG]; signet ring [1.5 x3 mm gastric incisura; Dr.Skulskie]; EUS [Dr.Burnbridge]; no significant abnormality noted;  Reviewed at [facility] also. JAN 2018- PET NED. April 2018- S/p partial gastrectomy [Dr.Sankar]- STAGE I ADENO CA; NO adjuvant therapy.  # STAGE I CARCINOID s/p partial gastrectomy   # May 2018- Chronic Atrophic gastritis- Prevpac   # FEB 2017- ADENOCARCINOMA with Lepidic 80%-20% acinar pattern; pT2a [Stage IB;T-2.3cm; visceral pleural invasion present; pN=0 ]; AUG 2017- CT NED       Primary malignant neoplasm of right lower lobe of lung ([place])   12/02/2015 Initial Diagnosis    Primary malignant neoplasm of right lower lobe of lung ([facility])      Adenocarcinoma of gastric cardia ([facility])     HISTORY OF PRESENTING ILLNESS:  Stacy Cook 81 y.o.  female patient Stage I lung cancer adenocarcinoma; And history of gastric adenocarcinoma status post partial gastrectomy is here for follow-up  Patient recently had EGD that did not show any recurrent disease; however showed continued chronic inflammation in the stomach. Continues to complain of mild easy bruising otherwise not any worse.  No blood in stools black stools. No fevers or chills.  ROS: A complete 10 point review of system is done which is negative except mentioned above in history of present illness  MEDICAL HISTORY:  Past Medical History:  Diagnosis Date  . Anemia   . Arthritis    back  . Basal cell carcinoma of skin 2016  . Cancer of right lung ([place]) 05/09/2015   Dr. Genevive Bi performed Right lower lobe lobectomy.   . Cataract   . Diabetes mellitus without complication ([place])   . GERD  (gastroesophageal reflux disease)   . Hypertension   . Murmur   . Osteoporosis   . Stomach tumor (benign)     SURGICAL HISTORY: Past Surgical History:  Procedure Laterality Date  . ABDOMINAL HYSTERECTOMY    . APPENDECTOMY    . ESOPHAGOGASTRODUODENOSCOPY (EGD) WITH PROPOFOL N/A 03/27/2016   Procedure: ESOPHAGOGASTRODUODENOSCOPY (EGD) WITH PROPOFOL;  Surgeon: Lollie Sails, MD;  Location: [facility] ENDOSCOPY;  Service: Endoscopy;  Laterality: N/A;  . ESOPHAGOGASTRODUODENOSCOPY (EGD) WITH PROPOFOL N/A 05/28/2016   Procedure: ESOPHAGOGASTRODUODENOSCOPY (EGD) WITH PROPOFOL;  Surgeon: Lollie Sails, MD;  Location: [facility] ENDOSCOPY;  Service: Endoscopy;  Laterality: N/A;  . ESOPHAGOGASTRODUODENOSCOPY (EGD) WITH PROPOFOL N/A 09/25/2016   Procedure: ESOPHAGOGASTRODUODENOSCOPY (EGD) WITH PROPOFOL;  Surgeon: Christene Lye, MD;  Location: [facility] ENDOSCOPY;  Service: Endoscopy;  Laterality: N/A;  . ESOPHAGOGASTRODUODENOSCOPY (EGD) WITH PROPOFOL N/A 12/18/2016   Procedure: ESOPHAGOGASTRODUODENOSCOPY (EGD) WITH PROPOFOL;  Surgeon: Christene Lye, MD;  Location: [facility] ENDOSCOPY;  Service: Endoscopy;  Laterality: N/A;  . PARTIAL GASTRECTOMY N/A 08/03/2016   Procedure: PARTIAL GASTRECTOMY;  Surgeon: Christene Lye, MD;  Location: [facility] ORS;  Service: General;  Laterality: N/A;  . SHOULDER ARTHROSCOPY W/ CAPSULAR REPAIR Right   . THORACOTOMY Right 05/09/2015   Procedure: THORACOTOMY, RIGHT LOWER LOBECTOMY, BRONCHOSCOPY;  Surgeon: Nestor Lewandowsky, MD;  Location: [facility] ORS;  Service: Thoracic;  Laterality: Right;  . UPPER GI ENDOSCOPY N/A 08/03/2016   Procedure: UPPER  ENDOSCOPY;  Surgeon: Christene Lye, MD;  Location: [facility] ORS;  Service: General;  Laterality:  N/A;    SOCIAL HISTORY: Social History   Social History  . Marital status: Widowed    Spouse name: N/A  . Number of children: N/A  . Years of education: N/A   Occupational History  . Not on file.   Social History Main  Topics  . Smoking status: Never Smoker  . Smokeless tobacco: Never Used  . Alcohol use No  . Drug use: No  . Sexual activity: Not on file   Other Topics Concern  . Not on file   Social History Narrative  . No narrative on file    FAMILY HISTORY: Family History  Problem Relation Age of Onset  . Diabetes Other   . Breast cancer Neg Hx     ALLERGIES:  is allergic to statins; sulfa antibiotics; and pneumococcal vaccine.  MEDICATIONS:  Current Outpatient Prescriptions  Medication Sig Dispense Refill  . cholecalciferol (VITAMIN D) 1000 UNITS tablet Take 1,000 Units by mouth daily.    . cyanocobalamin (V-R VITAMIN B-12) 500 MCG tablet Take 500 mcg by mouth daily.     Marland Kitchen losartan (COZAAR) 100 MG tablet Take 100 mg by mouth daily.     . metoprolol succinate (TOPROL-XL) 25 MG 24 hr tablet Take 25 mg by mouth every evening.     . Probiotic Product (PROBIOTIC DAILY PO) Take 1 tablet by mouth daily.     . sucralfate (CARAFATE) 1 g tablet Take 1 tablet (1 g total) by mouth 4 (four) times daily -  with meals and at bedtime. 90 tablet 3  . tizanidine (ZANAFLEX) 2 MG capsule Take 2 mg by mouth 2 (two) times daily.    Marland Kitchen aspirin 81 MG tablet Take 81 mg by mouth daily. Reported on 06/03/2015    . calcium carbonate (TUMS - DOSED IN MG ELEMENTAL CALCIUM) 500 MG chewable tablet Chew 2 tablets by mouth 2 (two) times daily as needed for indigestion or heartburn.    . metFORMIN (GLUCOPHAGE) 500 MG tablet Take 500 mg by mouth daily with breakfast.     . Omega-3 Fatty Acids (FISH OIL) 1000 MG CAPS Take 1,000 mg by mouth 2 (two) times daily.      No current facility-administered medications for this visit.       Marland Kitchen  PHYSICAL EXAMINATION: ECOG PERFORMANCE STATUS: 0 - Asymptomatic  Vitals:   12/21/16 0914  BP: (!) 123/57  Pulse: (!) 43  Resp: 16  Temp: 98.1 F (36.7 C)   Filed Weights   12/21/16 0914  Weight: 117 lb 3.2 oz (53.2 kg)    GENERAL: Well-nourished well-developed; Alert, no  distress and comfortable.   Alone EYES: no pallor or icterus OROPHARYNX: no thrush or ulceration; good dentition  NECK: supple, no masses felt LYMPH:  no palpable lymphadenopathy in the cervical, axillary or inguinal regions LUNGS: clear to auscultation and  No wheeze or crackles HEART/CVS: regular rate & rhythm; positive for murmur. No lower extremity edema ABDOMEN: abdomen soft, non-tender and normal bowel sounds; incision well healing. Musculoskeletal:no cyanosis of digits and no clubbing  PSYCH: alert & oriented x 3 with fluent speech NEURO: no focal motor/sensory deficits     LABORATORY DATA:  I have reviewed the data as listed Lab Results  Component Value Date   WBC 3.6 12/21/2016   HGB 9.9 (L) 12/21/2016   HCT 27.8 (L) 12/21/2016   MCV 84.3 12/21/2016   PLT 89 (L) 12/21/2016    Recent Labs  04/10/16 1411  07/06/16 1015  08/05/16 0259 10/31/16  1033 12/21/16 0846  NA 141  < > 139  < > 130* 143 139  K 4.3  < > 4.2  < > 4.2 4.2 3.2*  CL 104  < > 105  < > 100* 107 105  CO2 29  < > 29  < > 24 28 26   GLUCOSE 88  < > 101*  < > 87 91 97  BUN 16  < > 21*  < > 17 19 18   CREATININE 1.06*  < > 1.11*  < > 0.93 1.22* 1.19*  CALCIUM 9.2  < > 9.4  < > 7.9* 9.5 8.6*  GFRNONAA 49*  < > 46*  < > 57* 41* 43*  GFRAA 57*  < > 54*  < > >60 48* 49*  PROT 7.2  --  7.1  --   --   --   --   ALBUMIN 4.3  --  4.0  --   --   --   --   AST 21  --  20  --   --   --   --   ALT 11*  --  12*  --   --   --   --   ALKPHOS 68  --  70  --   --   --   --   BILITOT 0.8  --  0.7  --   --   --   --   < > = values in this interval not displayed.  RADIOGRAPHIC STUDIES: I have personally reviewed the radiological images as listed and agreed with the findings in the report. No results found.  ASSESSMENT & PLAN:  Primary malignant neoplasm of right lower lobe of lung ([place]) # Signet ring adenocarcinoma- gastric incisura; STAGE I; status post partial gastrectomy. No adjuvant therapy. Recent  endoscopy-September 2018 negative for any recurrence; positive for inflammation.  # Mild anemia- hemoglobin 9.9- asymptomatic. Monitor for now question malabsorption. We will check iron studies at next visit.  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 Asymptomatic. Continue surveillance.  #  Right lung adenocarcinoma stage IB [ positive for  Visceral pleural invasion/ size 2.3 cm] Status post lobectomy. No adjuvant chemo.    # Breast abnormality is noted on mammogram in January 2018- recommend mammogram. Ordered today.  # follow upin 6 months- CT scan prior/labs.     Cammie Sickle, MD 12/21/2016 8:12 PM

## 2016-12-21 NOTE — Progress Notes (Signed)
Patient is here today for a follow up. Patient states no new concerns today.  

## 2016-12-21 NOTE — Assessment & Plan Note (Addendum)
#   Signet ring adenocarcinoma- gastric incisura; STAGE I; status post partial gastrectomy. No adjuvant therapy. Recent endoscopy-September 2018 negative for any recurrence; positive for inflammation.  # Mild anemia- hemoglobin 9.9- asymptomatic. Monitor for now question malabsorption. We will check iron studies at next visit.  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 Asymptomatic. Continue surveillance.  #  Right lung adenocarcinoma stage IB [ positive for  Visceral pleural invasion/ size 2.3 cm] Status post lobectomy. No adjuvant chemo.    # Breast abnormality is noted on mammogram in January 2018- recommend mammogram. Ordered today.  # follow upin 6 months- CT scan prior/labs.

## 2016-12-25 ENCOUNTER — Other Ambulatory Visit: Payer: Self-pay | Admitting: *Deleted

## 2016-12-25 DIAGNOSIS — R928 Other abnormal and inconclusive findings on diagnostic imaging of breast: Secondary | ICD-10-CM

## 2016-12-27 ENCOUNTER — Ambulatory Visit (INDEPENDENT_AMBULATORY_CARE_PROVIDER_SITE_OTHER): Payer: Medicare Other | Admitting: General Surgery

## 2016-12-27 ENCOUNTER — Encounter: Payer: Self-pay | Admitting: General Surgery

## 2016-12-27 VITALS — BP 118/68 | HR 66 | Resp 12 | Ht 61.0 in | Wt 119.0 lb

## 2016-12-27 DIAGNOSIS — C160 Malignant neoplasm of cardia: Secondary | ICD-10-CM

## 2016-12-27 DIAGNOSIS — K311 Adult hypertrophic pyloric stenosis: Secondary | ICD-10-CM

## 2016-12-27 MED ORDER — PANCRELIPASE (LIP-PROT-AMYL) 12000-38000 UNITS PO CPEP: 12000.0000 [IU] | ORAL_CAPSULE | Freq: Three times a day (TID) | ORAL | 0 refills | Status: DC

## 2016-12-27 NOTE — Patient Instructions (Signed)
Patient will be scheduled for another endoscopy in 6 weeks to undergo further dilation. Pancrease 12,000 units was prescribed, continue carafate.

## 2016-12-27 NOTE — Progress Notes (Signed)
Patient ID: Stacy Cook, female   DOB: 21-Feb-1936, 81 y.o.   MRN: [id number]  Chief Complaint  Patient presents with  . Follow-up    HPI Stacy Cook is a 81 y.o. female here today to discuss her endoscopy done on 12/18/2016. She has a history of gastritis and gastric adenocarcinoma s/p partial gastrectomy.  HPI  Past Medical History:  Diagnosis Date  . Anemia   . Arthritis    back  . Basal cell carcinoma of skin 2016  . Cancer of right lung ([place]) 05/09/2015   Dr. Genevive Bi performed Right lower lobe lobectomy.   . Cataract   . Diabetes mellitus without complication ([place])   . GERD (gastroesophageal reflux disease)   . Hypertension   . Murmur   . Osteoporosis   . Stomach tumor (benign)     Past Surgical History:  Procedure Laterality Date  . ABDOMINAL HYSTERECTOMY    . APPENDECTOMY    . ESOPHAGOGASTRODUODENOSCOPY (EGD) WITH PROPOFOL N/A 03/27/2016   Procedure: ESOPHAGOGASTRODUODENOSCOPY (EGD) WITH PROPOFOL;  Surgeon: Lollie Sails, MD;  Location: [facility] ENDOSCOPY;  Service: Endoscopy;  Laterality: N/A;  . ESOPHAGOGASTRODUODENOSCOPY (EGD) WITH PROPOFOL N/A 05/28/2016   Procedure: ESOPHAGOGASTRODUODENOSCOPY (EGD) WITH PROPOFOL;  Surgeon: Lollie Sails, MD;  Location: [facility] ENDOSCOPY;  Service: Endoscopy;  Laterality: N/A;  . ESOPHAGOGASTRODUODENOSCOPY (EGD) WITH PROPOFOL N/A 09/25/2016   Procedure: ESOPHAGOGASTRODUODENOSCOPY (EGD) WITH PROPOFOL;  Surgeon: Christene Lye, MD;  Location: [facility] ENDOSCOPY;  Service: Endoscopy;  Laterality: N/A;  . ESOPHAGOGASTRODUODENOSCOPY (EGD) WITH PROPOFOL N/A 12/18/2016   Procedure: ESOPHAGOGASTRODUODENOSCOPY (EGD) WITH PROPOFOL;  Surgeon: Christene Lye, MD;  Location: [facility] ENDOSCOPY;  Service: Endoscopy;  Laterality: N/A;  . PARTIAL GASTRECTOMY N/A 08/03/2016   Procedure: PARTIAL GASTRECTOMY;  Surgeon: Christene Lye, MD;  Location: [facility] ORS;  Service: General;  Laterality: N/A;  . SHOULDER ARTHROSCOPY W/ CAPSULAR  REPAIR Right   . THORACOTOMY Right 05/09/2015   Procedure: THORACOTOMY, RIGHT LOWER LOBECTOMY, BRONCHOSCOPY;  Surgeon: Nestor Lewandowsky, MD;  Location: [facility] ORS;  Service: Thoracic;  Laterality: Right;  . UPPER GI ENDOSCOPY N/A 08/03/2016   Procedure: UPPER  ENDOSCOPY;  Surgeon: Christene Lye, MD;  Location: [facility] ORS;  Service: General;  Laterality: N/A;    Family History  Problem Relation Age of Onset  . Diabetes Other   . Breast cancer Neg Hx     Social History Social History  Substance Use Topics  . Smoking status: Never Smoker  . Smokeless tobacco: Never Used  . Alcohol use No    Allergies  Allergen Reactions  . Statins     Joint pain  . Sulfa Antibiotics Nausea And Vomiting  . Pneumococcal Vaccine Itching    Hives and fever    Current Outpatient Prescriptions  Medication Sig Dispense Refill  . aspirin 81 MG tablet Take 81 mg by mouth daily. Reported on 06/03/2015    . calcium carbonate (TUMS - DOSED IN MG ELEMENTAL CALCIUM) 500 MG chewable tablet Chew 2 tablets by mouth 2 (two) times daily as needed for indigestion or heartburn.    . cholecalciferol (VITAMIN D) 1000 UNITS tablet Take 1,000 Units by mouth daily.    . cyanocobalamin (V-R VITAMIN B-12) 500 MCG tablet Take 500 mcg by mouth daily.     Marland Kitchen losartan (COZAAR) 100 MG tablet Take 100 mg by mouth daily.     . metFORMIN (GLUCOPHAGE) 500 MG tablet Take 500 mg by mouth daily with breakfast.     . Omega-3 Fatty Acids (FISH  OIL) 1000 MG CAPS Take 1,000 mg by mouth 2 (two) times daily.     . Probiotic Product (PROBIOTIC DAILY PO) Take 1 tablet by mouth daily.     . sucralfate (CARAFATE) 1 g tablet Take 1 tablet (1 g total) by mouth 4 (four) times daily -  with meals and at bedtime. 90 tablet 3  . tizanidine (ZANAFLEX) 2 MG capsule Take 2 mg by mouth 2 (two) times daily.    . lipase/protease/amylase (CREON) 12000 units CPEP capsule Take 1 capsule (12,000 Units total) by mouth 3 (three) times daily before meals. 270  capsule 0  . metoprolol succinate (TOPROL-XL) 25 MG 24 hr tablet Take 25 mg by mouth every evening.      No current facility-administered medications for this visit.     Review of Systems Review of Systems  Constitutional: Negative.   Respiratory: Negative.   Cardiovascular: Negative.     Blood pressure 118/68, pulse 66, resp. rate 12, height 5\' 1"  (1.549 m), weight 119 lb (54 kg).  Physical Exam Physical Exam  Constitutional: She is oriented to person, place, and time. She appears well-developed and well-nourished.  Neurological: She is alert and oriented to person, place, and time.    Data Reviewed Prior endoscopy reviewed  Assessment     History of gastric adenocarcinoma s/p partial gastrectomy with billroth I anastamosis, Gastritis with gastric outlet stenosis - Recent endoscopy was reviewed with patient and procedure results were discussed. There was some retained food, outlet was about 1cm.  Patient was informed that the pylorus was dilated from 1 cm to 1.5 cm.     Plan    Patient will be scheduled for another endoscopy in 6 weeks to undergo further dilation to 2 cm. Pancrease 12,000 units TID with meals was prescribed, continue carafate.  HPI, Physical Exam, Assessment and Plan have been scribed under the direction and in the presence of Mckinley Jewel, MD  Gaspar Cola, CMA  I have completed the exam and reviewed the above documentation for accuracy and completeness.  I agree with the above.  Haematologist has been used and any errors in dictation or transcription are unintentional.  Tiffannie Sloss G. Jamal Collin, M.D., F.A.C.S.   Junie Panning G 12/27/2016, 11:32 AM  Patient has been scheduled for an upper endoscopy on 01-23-17 at [facility]. Upper endoscopy instructions have been reviewed with the patient. It is okay for patient to continue an 81 mg aspirin once daily. This patient is aware to call the office if they have further questions.   Dominga Ferry,  CMA

## 2017-01-14 ENCOUNTER — Other Ambulatory Visit: Payer: Self-pay | Admitting: General Surgery

## 2017-01-14 DIAGNOSIS — K311 Adult hypertrophic pyloric stenosis: Secondary | ICD-10-CM

## 2017-01-23 ENCOUNTER — Encounter: Payer: Self-pay | Admitting: *Deleted

## 2017-01-23 ENCOUNTER — Ambulatory Visit: Payer: Medicare Other | Admitting: Anesthesiology

## 2017-01-23 ENCOUNTER — Encounter
Admission: RE | Disposition: A | Discharge status: Home / Self Care | Payer: Self-pay | Source: Ambulatory Visit | Attending: General Surgery

## 2017-01-23 ENCOUNTER — Ambulatory Visit
Admission: RE | Admit: 2017-01-23 | Discharge: 2017-01-23 | Disposition: A | Discharge status: Home / Self Care | Payer: Medicare Other | Source: Ambulatory Visit | Attending: General Surgery | Admitting: General Surgery

## 2017-01-23 DIAGNOSIS — I10 Essential (primary) hypertension: Secondary | ICD-10-CM | POA: Diagnosis not present

## 2017-01-23 DIAGNOSIS — K9189 Other postprocedural complications and disorders of digestive system: Secondary | ICD-10-CM | POA: Insufficient documentation

## 2017-01-23 DIAGNOSIS — Z85118 Personal history of other malignant neoplasm of bronchus and lung: Secondary | ICD-10-CM | POA: Insufficient documentation

## 2017-01-23 DIAGNOSIS — Z7984 Long term (current) use of oral hypoglycemic drugs: Secondary | ICD-10-CM | POA: Insufficient documentation

## 2017-01-23 DIAGNOSIS — K219 Gastro-esophageal reflux disease without esophagitis: Secondary | ICD-10-CM | POA: Diagnosis not present

## 2017-01-23 DIAGNOSIS — R011 Cardiac murmur, unspecified: Secondary | ICD-10-CM | POA: Diagnosis not present

## 2017-01-23 DIAGNOSIS — Z903 Acquired absence of stomach [part of]: Secondary | ICD-10-CM | POA: Diagnosis not present

## 2017-01-23 DIAGNOSIS — E1151 Type 2 diabetes mellitus with diabetic peripheral angiopathy without gangrene: Secondary | ICD-10-CM | POA: Diagnosis not present

## 2017-01-23 DIAGNOSIS — I2510 Atherosclerotic heart disease of native coronary artery without angina pectoris: Secondary | ICD-10-CM | POA: Diagnosis not present

## 2017-01-23 DIAGNOSIS — K311 Adult hypertrophic pyloric stenosis: Secondary | ICD-10-CM

## 2017-01-23 DIAGNOSIS — Z980 Intestinal bypass and anastomosis status: Secondary | ICD-10-CM | POA: Diagnosis not present

## 2017-01-23 HISTORY — PX: ESOPHAGOGASTRODUODENOSCOPY (EGD) WITH PROPOFOL: SHX5813

## 2017-01-23 HISTORY — DX: Malignant neoplasm of stomach, unspecified: C16.9

## 2017-01-23 LAB — GLUCOSE, CAPILLARY: Glucose-Capillary: 86 mg/dL (ref 65–99)

## 2017-01-23 SURGERY — ESOPHAGOGASTRODUODENOSCOPY (EGD) WITH PROPOFOL
Anesthesia: General

## 2017-01-23 MED ORDER — PROPOFOL 10 MG/ML IV BOLUS
INTRAVENOUS | Status: AC
  Filled 2017-01-23: qty 20

## 2017-01-23 MED ORDER — LIDOCAINE HCL (PF) 2 % IJ SOLN
INTRAMUSCULAR | Status: AC
  Filled 2017-01-23: qty 10

## 2017-01-23 MED ORDER — FENTANYL CITRATE (PF) 100 MCG/2ML IJ SOLN
INTRAMUSCULAR | Status: DC | PRN
  Administered 2017-01-23 (×2): 50 ug via INTRAVENOUS

## 2017-01-23 MED ORDER — PROPOFOL 10 MG/ML IV BOLUS
INTRAVENOUS | Status: DC | PRN
  Administered 2017-01-23: 30 mg via INTRAVENOUS

## 2017-01-23 MED ORDER — PROPOFOL 500 MG/50ML IV EMUL
INTRAVENOUS | Status: DC | PRN
  Administered 2017-01-23: 120 ug/kg/min via INTRAVENOUS

## 2017-01-23 MED ORDER — FENTANYL CITRATE (PF) 100 MCG/2ML IJ SOLN
INTRAMUSCULAR | Status: AC
  Filled 2017-01-23: qty 2

## 2017-01-23 MED ORDER — SODIUM CHLORIDE 0.9 % IV SOLN
INTRAVENOUS | Status: DC
  Administered 2017-01-23: 08:00:00 via INTRAVENOUS

## 2017-01-23 NOTE — Interval H&P Note (Signed)
History and Physical Interval Note:  01/23/2017 8:12 AM  Stacy Cook  has presented today for surgery, with the diagnosis of GASTRIC OUTLET NARROWING  The various methods of treatment have been discussed with the patient and family. After consideration of risks, benefits and other options for treatment, the patient has consented to  Procedure(s): ESOPHAGOGASTRODUODENOSCOPY (EGD) WITH PROPOFOL (N/A) as a surgical intervention .  The patient's history has been reviewed, patient examined, no change in status, stable for surgery.  I have reviewed the patient's chart and labs.  Questions were answered to the patient's satisfaction.     Ivann Trimarco G

## 2017-01-23 NOTE — Anesthesia Post-op Follow-up Note (Signed)
Anesthesia QCDR form completed.        

## 2017-01-23 NOTE — H&P (Signed)
  Pt ios s/p partial gastrectomy with Billroth 1 . Has outlet narrowing and was dilated to 56mm 6 weeks ago. Here today for repeat assessment dilatation  No obstructive symptoms.  VSS Neck supple.  Lungs clear. Heart, NSR Abdomen soft, non tender   OK to proceed with endoscopy as planned.  See details of meds and allergies on prior OV note

## 2017-01-23 NOTE — Anesthesia Preprocedure Evaluation (Addendum)
Anesthesia Evaluation  Patient identified by MRN, date of birth, ID band Patient awake    Reviewed: Allergy & Precautions, NPO status , Patient's Chart, lab work & pertinent test results  Airway Mallampati: II       Dental  (+) Teeth Intact   Pulmonary  Hx of lung ca   breath sounds clear to auscultation       Cardiovascular Exercise Tolerance: Good hypertension, Pt. on medications and Pt. on home beta blockers + CAD and + Peripheral Vascular Disease  + Valvular Problems/Murmurs AS  Rhythm:Regular + Diastolic murmurs    Neuro/Psych    GI/Hepatic Neg liver ROS, GERD  Medicated,  Endo/Other  diabetes, Type 2, Oral Hypoglycemic Agents  Renal/GU      Musculoskeletal   Abdominal Normal abdominal exam  (+)   Peds negative pediatric ROS (+)  Hematology  (+) anemia ,   Anesthesia Other Findings   Reproductive/Obstetrics                             Anesthesia Physical Anesthesia Plan  ASA: III  Anesthesia Plan: General   Post-op Pain Management:    Induction: Intravenous  PONV Risk Score and Plan: 0  Airway Management Planned: Natural Airway and Nasal Cannula  Additional Equipment:   Intra-op Plan:   Post-operative Plan:   Informed Consent: I have reviewed the patients History and Physical, chart, labs and discussed the procedure including the risks, benefits and alternatives for the proposed anesthesia with the patient or authorized representative who has indicated his/her understanding and acceptance.     Plan Discussed with: Surgeon  Anesthesia Plan Comments:         Anesthesia Quick Evaluation

## 2017-01-23 NOTE — Anesthesia Postprocedure Evaluation (Signed)
Anesthesia Post Note  Patient: Stacy Cook  Procedure(s) Performed: ESOPHAGOGASTRODUODENOSCOPY (EGD) WITH PROPOFOL (N/A )  Patient location during evaluation: PACU Anesthesia Type: General Level of consciousness: awake Pain management: pain level controlled Vital Signs Assessment: post-procedure vital signs reviewed and stable Cardiovascular status: stable Anesthetic complications: no     Last Vitals:  Vitals:   01/23/17 0918 01/23/17 0928  BP: (!) 175/51 (!) 182/55  Pulse: (!) 57 (!) 58  Resp: 11 12  Temp:    SpO2: 100% 100%    Last Pain:  Vitals:   01/23/17 0848  TempSrc: Tympanic  PainSc: Asleep                 VAN STAVEREN,Delorise Hunkele

## 2017-01-23 NOTE — Transfer of Care (Signed)
Immediate Anesthesia Transfer of Care Note  Patient: Stacy Cook  Procedure(s) Performed: ESOPHAGOGASTRODUODENOSCOPY (EGD) WITH PROPOFOL (N/A )  Patient Location: PACU  Anesthesia Type:General  Level of Consciousness: awake  Airway & Oxygen Therapy: Patient Spontanous Breathing and Patient connected to nasal cannula oxygen  Post-op Assessment: Report given to RN and Post -op Vital signs reviewed and stable  Post vital signs: Reviewed  Last Vitals:  Vitals:   01/23/17 0711  BP: (!) 181/51  Pulse: (!) 58  Resp: 16  Temp: (!) 36.2 C  SpO2: 100%    Last Pain:  Vitals:   01/23/17 0711  TempSrc: Oral         Complications: No apparent anesthesia complications

## 2017-01-23 NOTE — Op Note (Signed)
[facility] Gastroenterology Patient Name: Stacy Cook Procedure Date: 01/23/2017 7:33 AM MRN: [id number] Account #: [id number] Date of Birth: [birth year] Admit Type: Outpatient Age: 81 Room: [facility] ENDO ROOM 1 Gender: Female Note Status: Finalized Procedure:            Upper GI endoscopy Indications:          Post-surgical anastomotic stenosis Providers:            Seeplaputhur G. Jamal Collin, MD Referring MD:         Ramonita Lab, MD (Referring MD) Medicines:            General Anesthesia Complications:        No immediate complications. Procedure:            Pre-Anesthesia Assessment:                       - General anesthesia under the supervision of an                        anesthesiologist was determined to be medically                        necessary for this procedure based on review of the                        patient's medical history, medications, and prior                        anesthesia history.                       After obtaining informed consent, the endoscope was                        passed under direct vision. Throughout the procedure,                        the patient's blood pressure, pulse, and oxygen                        saturations were monitored continuously. The Endoscope                        was introduced through the mouth, and advanced to the                        second part of duodenum. The upper GI endoscopy was                        accomplished without difficulty. The patient tolerated                        the procedure well. Findings:      The esophagus was normal.      The examined duodenum was normal.      Evidence of a stenosed Billroth I gastroduodenostomy was found. A       gastric pouch with a large size was found containing a bezoar. This was       very small compared to last tome. The gastroduodenal anastomosis was       characterized by moderate stenosis. This was  traversed after dilation. A       balloon  dilator was passed through the scope. Dilation with a 20 mm       pyloric balloon dilator was performed. The dilation site was examined       and showed complete resolution of luminal narrowing. Estimated blood       loss was minimal.      The exam was otherwise without abnormality. Impression:           - Normal esophagus.                       - Normal examined duodenum.                       - Stenosed Billroth I gastroduodenostomy was found,                        characterized by moderate stenosis. Dilated.                       - The examination was otherwise normal.                       - No specimens collected. Recommendation:       - Discharge patient to home.                       - Resume previous diet.                       - Continue present medications.                       - Return to my office in 3 months. Procedure Code(s):    --- Professional ---                       [phone], Esophagogastroduodenoscopy, flexible, transoral;                        with dilation of gastric/duodenal stricture(s) (eg,                        balloon, bougie) Diagnosis Code(s):    --- Professional ---                       K91.89, Other postprocedural complications and                        disorders of digestive system                       Z98.0, Intestinal bypass and anastomosis status CPT copyright 2016 American Medical Association. All rights reserved. The codes documented in this report are preliminary and upon coder review may  be revised to meet current compliance requirements. Christene Lye, MD 01/23/2017 8:51:16 AM This report has been signed electronically. Number of Addenda: 0 Note Initiated On: 01/23/2017 7:33 AM      [facility]

## 2017-01-24 ENCOUNTER — Encounter: Payer: Self-pay | Admitting: General Surgery

## 2017-01-30 ENCOUNTER — Encounter: Payer: Self-pay | Admitting: Anesthesiology

## 2017-01-30 ENCOUNTER — Encounter: Payer: Self-pay | Admitting: *Deleted

## 2017-02-06 ENCOUNTER — Other Ambulatory Visit (INDEPENDENT_AMBULATORY_CARE_PROVIDER_SITE_OTHER): Payer: Self-pay | Admitting: Vascular Surgery

## 2017-02-06 DIAGNOSIS — I6523 Occlusion and stenosis of bilateral carotid arteries: Secondary | ICD-10-CM

## 2017-02-08 ENCOUNTER — Ambulatory Visit (INDEPENDENT_AMBULATORY_CARE_PROVIDER_SITE_OTHER): Payer: Medicare Other | Admitting: Vascular Surgery

## 2017-02-08 ENCOUNTER — Ambulatory Visit (INDEPENDENT_AMBULATORY_CARE_PROVIDER_SITE_OTHER): Payer: Medicare Other

## 2017-02-08 ENCOUNTER — Encounter (INDEPENDENT_AMBULATORY_CARE_PROVIDER_SITE_OTHER): Payer: Self-pay | Admitting: Vascular Surgery

## 2017-02-08 VITALS — BP 141/50 | HR 48 | Resp 14 | Ht 61.0 in | Wt 118.0 lb

## 2017-02-08 DIAGNOSIS — E119 Type 2 diabetes mellitus without complications: Secondary | ICD-10-CM | POA: Diagnosis not present

## 2017-02-08 DIAGNOSIS — E785 Hyperlipidemia, unspecified: Secondary | ICD-10-CM

## 2017-02-08 DIAGNOSIS — I6523 Occlusion and stenosis of bilateral carotid arteries: Secondary | ICD-10-CM

## 2017-02-08 NOTE — Progress Notes (Signed)
Subjective:    Patient ID: Stacy Cook, female    DOB: May 23, 1935, 81 y.o.   MRN: [id number] Chief Complaint  Patient presents with  . Carotid    1 year carotid follow up   Patient presents for a yearly carotid stenosis follow-up. The patient presents without complaint. The patient denies any symptoms such as amaurosis fugax or neurological symptoms. He patient underwent a bilateral carotid artery duplex exam which was notable for 1-39% stenosis of the bilateral internal carotid arteries, left greater than right without hemodynamic significance. Compared to the previous exam on 04/26/2015 there has been no significant change. The patient denies any fever, nausea or vomiting.   Review of Systems  Constitutional: Negative.   HENT: Negative.   Eyes: Negative.   Respiratory: Negative.   Cardiovascular: Negative.   Gastrointestinal: Negative.   Endocrine: Negative.   Genitourinary: Negative.   Musculoskeletal: Negative.   Skin: Negative.   Allergic/Immunologic: Negative.   Neurological: Negative.   Hematological: Negative.   Psychiatric/Behavioral: Negative.       Objective:   Physical Exam  Constitutional: She is oriented to person, place, and time. She appears well-developed. No distress.  HENT:  Head: Normocephalic and atraumatic.  Eyes: Pupils are equal, round, and reactive to light. Conjunctivae are normal.  Neck: Normal range of motion.  No carotid bruits noted on exam  Cardiovascular: Normal rate, regular rhythm, normal heart sounds and intact distal pulses.   Pulses:      Radial pulses are 2+ on the right side, and 2+ on the left side.  Pulmonary/Chest: Effort normal.  Musculoskeletal: Normal range of motion. She exhibits no edema.  Neurological: She is alert and oriented to person, place, and time.  Skin: Skin is warm and dry. She is not diaphoretic.  Psychiatric: She has a normal mood and affect. Her behavior is normal. Judgment and thought content normal.    Vitals reviewed.  BP (!) 141/50 (BP Location: Right Arm)   Pulse (!) 48   Resp 14   Ht 5\' 1"  (1.549 m)   Wt 118 lb (53.5 kg)   BMI 22.30 kg/m   Past Medical History:  Diagnosis Date  . Anemia   . Arthritis    back  . Basal cell carcinoma of skin 2016  . Cancer of right lung ([place]) 05/09/2015   Dr. Genevive Bi performed Right lower lobe lobectomy.   . Cataract   . Diabetes mellitus without complication ([place])   . GERD (gastroesophageal reflux disease)   . Hypertension   . Malignant tumor of stomach ([place]) 07/2016  . Murmur   . Osteoporosis    Social History   Social History  . Marital status: Widowed    Spouse name: N/A  . Number of children: N/A  . Years of education: N/A   Occupational History  . Not on file.   Social History Main Topics  . Smoking status: Never Smoker  . Smokeless tobacco: Never Used  . Alcohol use No  . Drug use: No  . Sexual activity: Not on file   Other Topics Concern  . Not on file   Social History Narrative  . No narrative on file   Past Surgical History:  Procedure Laterality Date  . ABDOMINAL HYSTERECTOMY    . APPENDECTOMY    . ESOPHAGOGASTRODUODENOSCOPY (EGD) WITH PROPOFOL N/A 03/27/2016   Procedure: ESOPHAGOGASTRODUODENOSCOPY (EGD) WITH PROPOFOL;  Surgeon: Lollie Sails, MD;  Location: [facility] ENDOSCOPY;  Service: Endoscopy;  Laterality: N/A;  . ESOPHAGOGASTRODUODENOSCOPY (EGD)  WITH PROPOFOL N/A 05/28/2016   Procedure: ESOPHAGOGASTRODUODENOSCOPY (EGD) WITH PROPOFOL;  Surgeon: Lollie Sails, MD;  Location: [facility] ENDOSCOPY;  Service: Endoscopy;  Laterality: N/A;  . ESOPHAGOGASTRODUODENOSCOPY (EGD) WITH PROPOFOL N/A 09/25/2016   Procedure: ESOPHAGOGASTRODUODENOSCOPY (EGD) WITH PROPOFOL;  Surgeon: Christene Lye, MD;  Location: [facility] ENDOSCOPY;  Service: Endoscopy;  Laterality: N/A;  . ESOPHAGOGASTRODUODENOSCOPY (EGD) WITH PROPOFOL N/A 12/18/2016   Procedure: ESOPHAGOGASTRODUODENOSCOPY (EGD) WITH PROPOFOL;  Surgeon: Christene Lye, MD;  Location: [facility] ENDOSCOPY;  Service: Endoscopy;  Laterality: N/A;  . ESOPHAGOGASTRODUODENOSCOPY (EGD) WITH PROPOFOL N/A 01/23/2017   Procedure: ESOPHAGOGASTRODUODENOSCOPY (EGD) WITH PROPOFOL;  Surgeon: Christene Lye, MD;  Location: [facility] ENDOSCOPY;  Service: Endoscopy;  Laterality: N/A;  . PARTIAL GASTRECTOMY N/A 08/03/2016   Procedure: PARTIAL GASTRECTOMY;  Surgeon: Christene Lye, MD;  Location: [facility] ORS;  Service: General;  Laterality: N/A;  . SHOULDER ARTHROSCOPY W/ CAPSULAR REPAIR Right   . THORACOTOMY Right 05/09/2015   Procedure: THORACOTOMY, RIGHT LOWER LOBECTOMY, BRONCHOSCOPY;  Surgeon: Nestor Lewandowsky, MD;  Location: [facility] ORS;  Service: Thoracic;  Laterality: Right;  . UPPER GI ENDOSCOPY N/A 08/03/2016   Procedure: UPPER  ENDOSCOPY;  Surgeon: Christene Lye, MD;  Location: [facility] ORS;  Service: General;  Laterality: N/A;   Family History  Problem Relation Age of Onset  . Diabetes Other   . Breast cancer Neg Hx    Allergies  Allergen Reactions  . Statins     Joint pain  . Sulfa Antibiotics Nausea And Vomiting  . Pneumococcal Vaccine Itching    Hives and fever      Assessment & Plan:  Patient presents for a yearly carotid stenosis follow-up. The patient presents without complaint. The patient denies any symptoms such as amaurosis fugax or neurological symptoms. He patient underwent a bilateral carotid artery duplex exam which was notable for 1-39% stenosis of the bilateral internal carotid arteries, left greater than right without hemodynamic significance. Compared to the previous exam on 04/26/2015 there has been no significant change. The patient denies any fever, nausea or vomiting.  1. Bilateral carotid artery stenosis - Stable Patient presents today without complaint Patient's physical exam is stable and unremarkable Patient with stable carotid duplex There is no indication for intervention at this time Patient to follow up in one year  with a surveillance carotid duplex  - VAS US CAROTID; Future  2. Type 2 diabetes mellitus without complication, unspecified whether long term insulin use (HCC) - Stable Encouraged good control as its slows the progression of atherosclerotic disease  3. Hyperlipidemia, unspecified hyperlipidemia type - Stable Encouraged good control as its slows the progression of atherosclerotic disease  Current Outpatient Prescriptions on File Prior to Visit  Medication Sig Dispense Refill  . cholecalciferol (VITAMIN D) 1000 UNITS tablet Take 1,000 Units by mouth daily.    Marland Kitchen guaiFENesin (MUCINEX) 600 MG 12 hr tablet Take by mouth 2 (two) times daily as needed.    Marland Kitchen HYDROcodone-acetaminophen (NORCO) 7.5-325 MG tablet Take 1 tablet by mouth every 6 (six) hours as needed for moderate pain.    Marland Kitchen lipase/protease/amylase (CREON) 12000 units CPEP capsule Take 1 capsule (12,000 Units total) by mouth 3 (three) times daily before meals. 270 capsule 0  . losartan (COZAAR) 100 MG tablet Take 100 mg by mouth daily.     . metFORMIN (GLUCOPHAGE) 500 MG tablet Take 500 mg by mouth daily with breakfast.     . Probiotic Product (PROBIOTIC DAILY PO) Take 1 tablet by mouth daily.     Marland Kitchen  RABEprazole (ACIPHEX) 20 MG tablet Take 20 mg by mouth daily.    . sucralfate (CARAFATE) 1 g tablet Take 1 tablet (1 g total) by mouth 4 (four) times daily -  with meals and at bedtime. 90 tablet 3  . tizanidine (ZANAFLEX) 2 MG capsule Take 2 mg by mouth 2 (two) times daily.    . metoprolol succinate (TOPROL-XL) 25 MG 24 hr tablet Take 25 mg by mouth every evening.      No current facility-administered medications on file prior to visit.    There are no Patient Instructions on file for this visit. No Follow-up on file.  Alejandra Hunt A Aerie Donica, PA-C

## 2017-02-11 ENCOUNTER — Ambulatory Visit: Admit: 2017-02-11 | Payer: Medicare Other | Admitting: Ophthalmology

## 2017-02-11 SURGERY — PHACOEMULSIFICATION, CATARACT, WITH IOL INSERTION
Anesthesia: Topical | Laterality: Left

## 2017-02-13 ENCOUNTER — Encounter: Payer: Self-pay | Admitting: General Surgery

## 2017-02-13 ENCOUNTER — Ambulatory Visit (INDEPENDENT_AMBULATORY_CARE_PROVIDER_SITE_OTHER): Payer: Medicare Other | Admitting: General Surgery

## 2017-02-13 VITALS — BP 148/60 | HR 56 | Resp 12 | Ht 61.0 in | Wt 116.0 lb

## 2017-02-13 DIAGNOSIS — C169 Malignant neoplasm of stomach, unspecified: Secondary | ICD-10-CM | POA: Diagnosis not present

## 2017-02-13 DIAGNOSIS — C3431 Malignant neoplasm of lower lobe, right bronchus or lung: Secondary | ICD-10-CM | POA: Diagnosis not present

## 2017-02-13 DIAGNOSIS — K311 Adult hypertrophic pyloric stenosis: Secondary | ICD-10-CM | POA: Diagnosis not present

## 2017-02-13 NOTE — Progress Notes (Signed)
Patient ID: Stacy Cook, female   DOB: October 25, 1935, 81 y.o.   MRN: [id number]  Chief Complaint  Patient presents with  . Follow-up    HPI Stacy Cook is a 81 y.o. female.  Here today for follow up endoscopy and dilation done 01-23-17. Denies any nausea, vomiting or diarrhea. She states she is ready to eat. Is taking pancrease and rabeprazole. She has a history of gastritis, gastric adenocarcinoma s/p partial gastrectomy on 12-18-16, and gastric outlet narrowing.    HPI  Past Medical History:  Diagnosis Date  . Anemia   . Arthritis    back  . Basal cell carcinoma of skin 2016  . Cancer of right lung ([place]) 05/09/2015   Dr. Genevive Bi performed Right lower lobe lobectomy.   . Cataract   . Diabetes mellitus without complication ([place])   . GERD (gastroesophageal reflux disease)   . Hypertension   . Malignant tumor of stomach ([place]) 07/2016  . Murmur   . Osteoporosis     Past Surgical History:  Procedure Laterality Date  . ABDOMINAL HYSTERECTOMY    . APPENDECTOMY    . SHOULDER ARTHROSCOPY W/ CAPSULAR REPAIR Right    ESOPHAGOGASTRODUODENOSCOPY (EGD) WITH PROPOFOL N/A 03/27/2016    Procedure: ESOPHAGOGASTRODUODENOSCOPY (EGD) WITH PROPOFOL;  Surgeon: Lollie Sails, MD;  Location: [facility] ENDOSCOPY;  Service: Endoscopy;  Laterality: N/A;  . ESOPHAGOGASTRODUODENOSCOPY (EGD) WITH PROPOFOL N/A 05/28/2016   Procedure: ESOPHAGOGASTRODUODENOSCOPY (EGD) WITH PROPOFOL;  Surgeon: Lollie Sails, MD;  Location: [facility] ENDOSCOPY;  Service: Endoscopy;  Laterality: N/A;  . ESOPHAGOGASTRODUODENOSCOPY (EGD) WITH PROPOFOL N/A 09/25/2016   Procedure: ESOPHAGOGASTRODUODENOSCOPY (EGD) WITH PROPOFOL;  Surgeon: Christene Lye, MD;  Location: [facility] ENDOSCOPY;  Service: Endoscopy;  Laterality: N/A;  . ESOPHAGOGASTRODUODENOSCOPY (EGD) WITH PROPOFOL N/A 12/18/2016   Procedure: ESOPHAGOGASTRODUODENOSCOPY (EGD) WITH PROPOFOL;  Surgeon: Christene Lye, MD;  Location: [facility] ENDOSCOPY;  Service:  Endoscopy;  Laterality: N/A;  . PARTIAL GASTRECTOMY N/A 08/03/2016   Procedure: PARTIAL GASTRECTOMY;  Surgeon: Christene Lye, MD;  Location: [facility] ORS;  Service: General;  Laterality: N/A;  . SHOULDER ARTHROSCOPY W/ CAPSULAR REPAIR Right   . THORACOTOMY Right 05/09/2015   Procedure: THORACOTOMY, RIGHT LOWER LOBECTOMY, BRONCHOSCOPY;  Surgeon: Nestor Lewandowsky, MD;  Location: [facility] ORS;  Service: Thoracic;  Laterality: Right;  . UPPER GI ENDOSCOPY N/A 08/03/2016   Procedure: UPPER  ENDOSCOPY;  Surgeon: Christene Lye, MD;  Location: [facility] ORS;  Service: General;  Laterality: N/A;       Family History  Problem Relation Age of Onset  . Diabetes Other   . Breast cancer Neg Hx     Social History Social History   Tobacco Use  . Smoking status: Never Smoker  . Smokeless tobacco: Never Used  Substance Use Topics  . Alcohol use: No  . Drug use: No    Allergies  Allergen Reactions  . Statins     Joint pain  . Sulfa Antibiotics Nausea And Vomiting  . Pneumococcal Vaccine Itching    Hives and fever    Current Outpatient Medications  Medication Sig Dispense Refill  . cholecalciferol (VITAMIN D) 1000 UNITS tablet Take 1,000 Units by mouth daily.    . cyanocobalamin 500 MCG tablet Take by mouth.    Marland Kitchen guaiFENesin (MUCINEX) 600 MG 12 hr tablet Take by mouth 2 (two) times daily as needed.    . lipase/protease/amylase (CREON) 12000 units CPEP capsule Take 1 capsule (12,000 Units total) by mouth 3 (three) times daily before meals. 270 capsule 0  .  losartan (COZAAR) 100 MG tablet Take 100 mg by mouth daily.     . metFORMIN (GLUCOPHAGE) 500 MG tablet Take 500 mg by mouth daily with breakfast.     . Omega-3 Fatty Acids (FISH OIL PO) Take by mouth.    . Probiotic Product (PROBIOTIC DAILY PO) Take 1 tablet by mouth daily.     . RABEprazole (ACIPHEX) 20 MG tablet Take 20 mg by mouth daily.    . tizanidine (ZANAFLEX) 2 MG capsule Take 2 mg by mouth 2 (two) times daily.    .  metoprolol succinate (TOPROL-XL) 25 MG 24 hr tablet Take 25 mg by mouth every evening.      No current facility-administered medications for this visit.     Review of Systems Review of Systems  Constitutional: Negative.   Respiratory: Negative.   Cardiovascular: Negative.   Gastrointestinal: Negative for constipation and diarrhea.    Blood pressure (!) 148/60, pulse (!) 56, resp. rate 12, height 5\' 1"  (1.549 m), weight 116 lb (52.6 kg).  Physical Exam Physical Exam  Constitutional: She is oriented to person, place, and time. She appears well-developed and well-nourished.  HENT:  Mouth/Throat: Oropharynx is clear and moist.  Eyes: Conjunctivae are normal. No scleral icterus.  Neck: Neck supple.  Cardiovascular: Normal rate and regular rhythm.  Murmur heard.  Systolic murmur is present with a grade of 4/6. Systolic ejection murmur, aortic stenosis  Pulmonary/Chest: Effort normal and breath sounds normal.  Abdominal: Soft. Bowel sounds are normal. There is no tenderness.  Lymphadenopathy:    She has no cervical adenopathy.  Neurological: She is alert and oriented to person, place, and time.  Skin: Skin is warm and dry.  Psychiatric: Her behavior is normal.    Data Reviewed Prior notes  Assessment    History of gastric adenocarcinoma s/p partial gastrectomy with billroth I anastamosis and gastritis with gastric outlet stenosis. S/p endoscopy with dilatation to 86mm 01/23/17. Was previously dilated to 62mm 12/18/16. Reviewed endoscopy findings with patient. Pt can slowly resume normal diet. Will re-schedule endoscopy w/ potential dilation to further evaluate.      Plan    Continue Creon/lipase and rabeprazole. Schedule endoscopy with possible gastric outlet dilation.  Slowly resume normal diet.       HPI, Physical Exam, Assessment and Plan have been scribed under the direction and in the presence of Mckinley Jewel, MD Karie Fetch, RN  I have completed the exam and  reviewed the above documentation for accuracy and completeness.  I agree with the above.  Haematologist has been used and any errors in dictation or transcription are unintentional.  Seeplaputhur G. Jamal Collin, M.D., F.A.C.S.  Junie Panning G 02/13/2017, 12:39 PM  Patient has been scheduled for an upper endoscopy on 03-20-17 at [facility]. Upper endoscopy instructions have been reviewed with the patient. She will only take her blood pressure medication the morning of procedure at 6 am with a small sip of water. Patient reports she is diabetic but not taking any medications for diabetes at this time. This patient is aware to call the office if they have further questions.   Dominga Ferry, CMA

## 2017-02-13 NOTE — Patient Instructions (Addendum)
The patient is aware to call back for any questions or concerns.  Continue Creon/lipase and Aciphex  Schedule endoscopy

## 2017-03-19 ENCOUNTER — Other Ambulatory Visit: Payer: Self-pay | Admitting: General Surgery

## 2017-03-19 DIAGNOSIS — K311 Adult hypertrophic pyloric stenosis: Secondary | ICD-10-CM

## 2017-03-20 ENCOUNTER — Ambulatory Visit: Payer: Medicare Other | Admitting: Anesthesiology

## 2017-03-20 ENCOUNTER — Ambulatory Visit
Admission: RE | Admit: 2017-03-20 | Discharge: 2017-03-20 | Disposition: A | Discharge status: Home / Self Care | Payer: Medicare Other | Source: Ambulatory Visit | Attending: General Surgery | Admitting: General Surgery

## 2017-03-20 ENCOUNTER — Encounter
Admission: RE | Disposition: A | Discharge status: Home / Self Care | Payer: Self-pay | Source: Ambulatory Visit | Attending: General Surgery

## 2017-03-20 ENCOUNTER — Encounter: Payer: Self-pay | Admitting: *Deleted

## 2017-03-20 DIAGNOSIS — Z85828 Personal history of other malignant neoplasm of skin: Secondary | ICD-10-CM | POA: Diagnosis not present

## 2017-03-20 DIAGNOSIS — Z79899 Other long term (current) drug therapy: Secondary | ICD-10-CM | POA: Insufficient documentation

## 2017-03-20 DIAGNOSIS — K219 Gastro-esophageal reflux disease without esophagitis: Secondary | ICD-10-CM | POA: Diagnosis present

## 2017-03-20 DIAGNOSIS — Z85118 Personal history of other malignant neoplasm of bronchus and lung: Secondary | ICD-10-CM | POA: Insufficient documentation

## 2017-03-20 DIAGNOSIS — Z85028 Personal history of other malignant neoplasm of stomach: Secondary | ICD-10-CM | POA: Diagnosis not present

## 2017-03-20 DIAGNOSIS — E119 Type 2 diabetes mellitus without complications: Secondary | ICD-10-CM | POA: Diagnosis not present

## 2017-03-20 DIAGNOSIS — Z7984 Long term (current) use of oral hypoglycemic drugs: Secondary | ICD-10-CM | POA: Diagnosis not present

## 2017-03-20 DIAGNOSIS — I10 Essential (primary) hypertension: Secondary | ICD-10-CM | POA: Insufficient documentation

## 2017-03-20 DIAGNOSIS — K311 Adult hypertrophic pyloric stenosis: Secondary | ICD-10-CM

## 2017-03-20 DIAGNOSIS — K9189 Other postprocedural complications and disorders of digestive system: Secondary | ICD-10-CM

## 2017-03-20 HISTORY — PX: ESOPHAGOGASTRODUODENOSCOPY (EGD) WITH PROPOFOL: SHX5813

## 2017-03-20 LAB — GLUCOSE, CAPILLARY: Glucose-Capillary: 78 mg/dL (ref 65–99)

## 2017-03-20 SURGERY — ESOPHAGOGASTRODUODENOSCOPY (EGD) WITH PROPOFOL
Anesthesia: General

## 2017-03-20 MED ORDER — SODIUM CHLORIDE 0.9 % IV SOLN
INTRAVENOUS | Status: DC
  Administered 2017-03-20: 09:00:00 via INTRAVENOUS

## 2017-03-20 MED ORDER — PROPOFOL 10 MG/ML IV BOLUS
INTRAVENOUS | Status: DC | PRN
  Administered 2017-03-20: 40 mg via INTRAVENOUS
  Administered 2017-03-20: 20 mg via INTRAVENOUS

## 2017-03-20 MED ORDER — SODIUM CHLORIDE 0.9 % IV SOLN
INTRAVENOUS | Status: DC | PRN
  Administered 2017-03-20: 10:00:00 via INTRAVENOUS

## 2017-03-20 MED ORDER — PROPOFOL 500 MG/50ML IV EMUL
INTRAVENOUS | Status: AC
  Filled 2017-03-20: qty 50

## 2017-03-20 MED ORDER — LIDOCAINE HCL (CARDIAC) 20 MG/ML IV SOLN
INTRAVENOUS | Status: DC | PRN
  Administered 2017-03-20: 50 mg via INTRAVENOUS

## 2017-03-20 MED ORDER — PROPOFOL 500 MG/50ML IV EMUL
INTRAVENOUS | Status: DC | PRN
  Administered 2017-03-20: 150 ug/kg/min via INTRAVENOUS

## 2017-03-20 NOTE — Interval H&P Note (Signed)
History and Physical Interval Note:  03/20/2017 9:44 AM  Stacy Cook  has presented today for surgery, with the diagnosis of GASTRIN OUTLET STENOSIS  The various methods of treatment have been discussed with the patient and family. After consideration of risks, benefits and other options for treatment, the patient has consented to  Procedure(s): ESOPHAGOGASTRODUODENOSCOPY (EGD) WITH PROPOFOL (N/A) as a surgical intervention .  The patient's history has been reviewed, patient examined, no change in status, stable for surgery.  I have reviewed the patient's chart and labs.  Questions were answered to the patient's satisfaction.     SANKAR,SEEPLAPUTHUR G

## 2017-03-20 NOTE — Op Note (Signed)
[facility] Patient Name: Stacy Cook Procedure Date: 03/20/2017 9:40 AM MRN: [id number] Account #: [id number] Date of Birth: 1935-10-03 Admit Type: Outpatient Age: 81 Room: [facility] ENDO ROOM 1 Gender: Female Note Status: Finalized Procedure:            Upper GI endoscopy Indications:          Surveillance procedure, Post-surgical anastomotic                        stenosis Providers:            Eagan Shifflett G. Jamal Collin, MD Medicines:            Total IV Anesthesia (TIVA) Complications:        No immediate complications. Procedure:            Pre-Anesthesia Assessment:                       - General anesthesia under the supervision of an                        anesthesiologist was determined to be medically                        necessary for this procedure based on review of the                        patient's medical history, medications, and prior                        anesthesia history.                       After obtaining informed consent, the endoscope was                        passed under direct vision. Throughout the procedure,                        the patient's blood pressure, pulse, and oxygen                        saturations were monitored continuously. The Endoscope                        was introduced through the mouth, and advanced to the                        second part of duodenum. The upper GI endoscopy was                        accomplished without difficulty. The patient tolerated                        the procedure well. Findings:      The esophagus was normal.      The examined duodenum was normal.      The anastomosis was normal. No bezoar noted. Anastamosis is now widely       patent and scope advanced into duodenum easiloy Impression:           - Normal esophagus.                       -  Normal examined duodenum.                       - Normal anastomosis.                       - No specimens  collected. Recommendation:       - Discharge patient to home.                       - Resume previous diet.                       - Continue present medications.                       - Return to my office in 3 months. Procedure Code(s):    --- Professional ---                       [phone], Esophagogastroduodenoscopy, flexible, transoral;                        diagnostic, including collection of specimen(s) by                        brushing or washing, when performed (separate procedure) Diagnosis Code(s):    --- Professional ---                       K91.89, Other postprocedural complications and                        disorders of digestive system CPT copyright 2016 American Medical Association. All rights reserved. The codes documented in this report are preliminary and upon coder review may  be revised to meet current compliance requirements. Christene Lye, MD 03/20/2017 10:03:11 AM This report has been signed electronically. Number of Addenda: 0 Note Initiated On: 03/20/2017 9:40 AM      [facility]

## 2017-03-20 NOTE — Anesthesia Post-op Follow-up Note (Signed)
Anesthesia QCDR form completed.        

## 2017-03-20 NOTE — H&P (Signed)
Stacy Cook is an 81 y.o. female.   Chief Complaint:  Here for upper endoscopy.  HPI: 7 yr olod female s/p partial gastrectomy with B1 anastamosis for CA stomach. Has some stenosis of outlet and has been serially dialted-last done 2 mos ago to 40mm. No obstructive symptoms.  Past Medical History:  Diagnosis Date  . Anemia   . Arthritis    back  . Basal cell carcinoma of skin 2016  . Cancer of right lung ([place]) 05/09/2015   Dr. Genevive Bi performed Right lower lobe lobectomy.   . Cataract   . Diabetes mellitus without complication ([place])   . GERD (gastroesophageal reflux disease)   . Hypertension   . Malignant tumor of stomach ([place]) 07/2016  . Murmur   . Osteoporosis     Past Surgical History:  Procedure Laterality Date  . ABDOMINAL HYSTERECTOMY    . APPENDECTOMY    . ESOPHAGOGASTRODUODENOSCOPY (EGD) WITH PROPOFOL N/A 03/27/2016   Procedure: ESOPHAGOGASTRODUODENOSCOPY (EGD) WITH PROPOFOL;  Surgeon: Lollie Sails, MD;  Location: [facility] ENDOSCOPY;  Service: Endoscopy;  Laterality: N/A;  . ESOPHAGOGASTRODUODENOSCOPY (EGD) WITH PROPOFOL N/A 05/28/2016   Procedure: ESOPHAGOGASTRODUODENOSCOPY (EGD) WITH PROPOFOL;  Surgeon: Lollie Sails, MD;  Location: [facility] ENDOSCOPY;  Service: Endoscopy;  Laterality: N/A;  . ESOPHAGOGASTRODUODENOSCOPY (EGD) WITH PROPOFOL N/A 09/25/2016   Procedure: ESOPHAGOGASTRODUODENOSCOPY (EGD) WITH PROPOFOL;  Surgeon: Christene Lye, MD;  Location: [facility] ENDOSCOPY;  Service: Endoscopy;  Laterality: N/A;  . ESOPHAGOGASTRODUODENOSCOPY (EGD) WITH PROPOFOL N/A 12/18/2016   Procedure: ESOPHAGOGASTRODUODENOSCOPY (EGD) WITH PROPOFOL;  Surgeon: Christene Lye, MD;  Location: [facility] ENDOSCOPY;  Service: Endoscopy;  Laterality: N/A;  . ESOPHAGOGASTRODUODENOSCOPY (EGD) WITH PROPOFOL N/A 01/23/2017   Procedure: ESOPHAGOGASTRODUODENOSCOPY (EGD) WITH PROPOFOL;  Surgeon: Christene Lye, MD;  Location: [facility] ENDOSCOPY;  Service: Endoscopy;  Laterality: N/A;   . PARTIAL GASTRECTOMY N/A 08/03/2016   Procedure: PARTIAL GASTRECTOMY;  Surgeon: Christene Lye, MD;  Location: [facility] ORS;  Service: General;  Laterality: N/A;  . SHOULDER ARTHROSCOPY W/ CAPSULAR REPAIR Right   . THORACOTOMY Right 05/09/2015   Procedure: THORACOTOMY, RIGHT LOWER LOBECTOMY, BRONCHOSCOPY;  Surgeon: Nestor Lewandowsky, MD;  Location: [facility] ORS;  Service: Thoracic;  Laterality: Right;  . UPPER GI ENDOSCOPY N/A 08/03/2016   Procedure: UPPER  ENDOSCOPY;  Surgeon: Christene Lye, MD;  Location: [facility] ORS;  Service: General;  Laterality: N/A;    Family History  Problem Relation Age of Onset  . Diabetes Other   . Breast cancer Neg Hx    Social History:  reports that  has never smoked. she has never used smokeless tobacco. She reports that she does not drink alcohol or use drugs.  Allergies:  Allergies  Allergen Reactions  . Statins     Joint pain  . Sulfa Antibiotics Nausea And Vomiting  . Pneumococcal Vaccine Itching    Hives and fever    Medications Prior to Admission  Medication Sig Dispense Refill  . cholecalciferol (VITAMIN D) 1000 UNITS tablet Take 1,000 Units by mouth daily.    . cyanocobalamin 500 MCG tablet Take by mouth.    . lipase/protease/amylase (CREON) 12000 units CPEP capsule Take 1 capsule (12,000 Units total) by mouth 3 (three) times daily before meals. 270 capsule 0  . losartan (COZAAR) 100 MG tablet Take 100 mg by mouth daily.     . metoprolol succinate (TOPROL-XL) 25 MG 24 hr tablet Take 25 mg by mouth every evening.     . RABEprazole (ACIPHEX) 20 MG tablet Take 20 mg by  mouth daily.    Marland Kitchen guaiFENesin (MUCINEX) 600 MG 12 hr tablet Take by mouth 2 (two) times daily as needed.    . metFORMIN (GLUCOPHAGE) 500 MG tablet Take 500 mg by mouth daily with breakfast.     . Omega-3 Fatty Acids (FISH OIL PO) Take by mouth.    . Probiotic Product (PROBIOTIC DAILY PO) Take 1 tablet by mouth daily.     . tizanidine (ZANAFLEX) 2 MG capsule Take 2 mg by mouth 2  (two) times daily.      Results for orders placed or performed during the hospital encounter of 03/20/17 (from the past 48 hour(s))  Glucose, capillary     Status: None   Collection Time: 03/20/17  8:54 AM  Result Value Ref Range   Glucose-Capillary 78 65 - 99 mg/dL   No results found.  Review of Systems  Constitutional: Negative.   Respiratory: Negative.   Cardiovascular: Negative.   Gastrointestinal: Negative.   Genitourinary: Negative.     Blood pressure (!) 168/50, pulse (!) 51, temperature (!) 96.8 F (36 C), temperature source Tympanic, resp. rate 18, height 5\' 1"  (1.549 m), weight 120 lb (54.4 kg), SpO2 100 %. Physical Exam  Constitutional: She is oriented to person, place, and time. She appears well-developed and well-nourished.  Eyes: Conjunctivae are normal. No scleral icterus.  Neck: Neck supple.  Cardiovascular: Normal rate, regular rhythm and normal heart sounds.  Respiratory: Effort normal.  GI: Soft. Bowel sounds are normal. She exhibits no distension and no mass.  Lymphadenopathy:    She has no cervical adenopathy.  Neurological: She is alert and oriented to person, place, and time.  Skin: Skin is warm and dry.     Assessment/Plan Assessment of gastric outlet, possible dilatation.  Christene Lye, MD 03/20/2017, 9:36 AM

## 2017-03-20 NOTE — Transfer of Care (Signed)
Immediate Anesthesia Transfer of Care Note  Patient: Stacy Cook  Procedure(s) Performed: ESOPHAGOGASTRODUODENOSCOPY (EGD) WITH PROPOFOL (N/A )  Patient Location: Endoscopy Unit  Anesthesia Type:General  Level of Consciousness: sedated  Airway & Oxygen Therapy: Patient Spontanous Breathing and Patient connected to nasal cannula oxygen  Post-op Assessment: Report given to RN and Post -op Vital signs reviewed and stable  Post vital signs: Reviewed and stable  Last Vitals:  Vitals:   03/20/17 0849 03/20/17 1000  BP: (!) 168/50 (!) 98/34  Pulse: (!) 51 (!) 53  Resp: 18 18  Temp: (!) 36 C 36.6 C  SpO2: 100% 100%    Last Pain:  Vitals:   03/20/17 1000  TempSrc: Tympanic         Complications: No apparent anesthesia complications

## 2017-03-20 NOTE — Anesthesia Preprocedure Evaluation (Signed)
Anesthesia Evaluation  Patient identified by MRN, date of birth, ID band Patient awake    Reviewed: Allergy & Precautions, NPO status , Patient's Chart, lab work & pertinent test results  History of Anesthesia Complications Negative for: history of anesthetic complications  Airway Mallampati: III  TM Distance: >3 FB Neck ROM: Full    Dental no notable dental hx.    Pulmonary neg sleep apnea, neg COPD,  Hx of lung cancer s/p resection, does not use any inhalers or home O2    breath sounds clear to auscultation- rhonchi (-) wheezing      Cardiovascular hypertension, Pt. on medications + CAD (nonocclusive)  (-) Past MI, (-) Cardiac Stents and (-) CABG + Valvular Problems/Murmurs (severe AS but asymptomatic, followed by Dr. Clayborn Bigness, currently recommending conservative management) AS  Rhythm:Regular Rate:Normal - Systolic murmurs and - Diastolic murmurs NM stress test 12/03/16: no evidence of ischemia  Echo 11/30/16: NORMAL LEFT VENTRICULAR SYSTOLIC FUNCTION WITH MODERATE LVH NORMAL RIGHT VENTRICULAR SYSTOLIC FUNCTION MILD VALVULAR REGURGITATION SEVERE VALVULAR STENOSIS (severe AS) MILD AR, MR, TR and PR EF >55%   Neuro/Psych negative neurological ROS  negative psych ROS   GI/Hepatic Neg liver ROS, GERD  ,  Endo/Other  diabetes (diet controlled)  Renal/GU negative Renal ROS     Musculoskeletal  (+) Arthritis ,   Abdominal (+) - obese,   Peds  Hematology  (+) anemia ,   Anesthesia Other Findings Past Medical History: No date: Anemia No date: Arthritis     Comment:  back 2016: Basal cell carcinoma of skin 05/09/2015: Cancer of right lung ([facility])     Comment:  Dr. Genevive Bi performed Right lower lobe lobectomy.  No date: Cataract No date: Diabetes mellitus without complication ([facility]) No date: GERD (gastroesophageal reflux disease) No date: Hypertension 07/2016: Malignant tumor of stomach ([place]) No date: Murmur No  date: Osteoporosis   Reproductive/Obstetrics                             Anesthesia Physical Anesthesia Plan  ASA: III  Anesthesia Plan: General   Post-op Pain Management:    Induction: Intravenous  PONV Risk Score and Plan: 2 and Propofol infusion  Airway Management Planned: Natural Airway  Additional Equipment:   Intra-op Plan:   Post-operative Plan:   Informed Consent: I have reviewed the patients History and Physical, chart, labs and discussed the procedure including the risks, benefits and alternatives for the proposed anesthesia with the patient or authorized representative who has indicated his/her understanding and acceptance.   Dental advisory given  Plan Discussed with: CRNA and Anesthesiologist  Anesthesia Plan Comments:         Anesthesia Quick Evaluation

## 2017-03-20 NOTE — Anesthesia Postprocedure Evaluation (Signed)
Anesthesia Post Note  Patient: Stacy Cook  Procedure(s) Performed: ESOPHAGOGASTRODUODENOSCOPY (EGD) WITH PROPOFOL (N/A )  Patient location during evaluation: Endoscopy Anesthesia Type: General Level of consciousness: awake and alert and oriented Pain management: pain level controlled Vital Signs Assessment: post-procedure vital signs reviewed and stable Respiratory status: spontaneous breathing, nonlabored ventilation and respiratory function stable Cardiovascular status: blood pressure returned to baseline and stable Postop Assessment: no signs of nausea or vomiting Anesthetic complications: no     Last Vitals:  Vitals:   03/20/17 1020 03/20/17 1030  BP: (!) 125/37 (!) 140/46  Pulse: (!) 51 (!) 51  Resp: 16 14  Temp:    SpO2: 100% 100%    Last Pain:  Vitals:   03/20/17 1000  TempSrc: Tympanic                 Marieta Markov

## 2017-03-25 ENCOUNTER — Encounter: Payer: Self-pay | Admitting: General Surgery

## 2017-03-29 ENCOUNTER — Encounter: Payer: Self-pay | Admitting: *Deleted

## 2017-04-04 ENCOUNTER — Ambulatory Visit: Payer: Medicare Other | Admitting: Certified Registered Nurse Anesthetist

## 2017-04-04 ENCOUNTER — Encounter: Payer: Self-pay | Admitting: *Deleted

## 2017-04-04 ENCOUNTER — Ambulatory Visit
Admission: RE | Admit: 2017-04-04 | Discharge: 2017-04-04 | Disposition: A | Discharge status: Home / Self Care | Payer: Medicare Other | Source: Ambulatory Visit | Attending: Ophthalmology | Admitting: Ophthalmology

## 2017-04-04 ENCOUNTER — Encounter
Admission: RE | Disposition: A | Discharge status: Home / Self Care | Payer: Self-pay | Source: Ambulatory Visit | Attending: Ophthalmology

## 2017-04-04 DIAGNOSIS — I10 Essential (primary) hypertension: Secondary | ICD-10-CM | POA: Diagnosis not present

## 2017-04-04 DIAGNOSIS — M810 Age-related osteoporosis without current pathological fracture: Secondary | ICD-10-CM | POA: Diagnosis not present

## 2017-04-04 DIAGNOSIS — H5703 Miosis: Secondary | ICD-10-CM | POA: Diagnosis not present

## 2017-04-04 DIAGNOSIS — E119 Type 2 diabetes mellitus without complications: Secondary | ICD-10-CM | POA: Diagnosis not present

## 2017-04-04 DIAGNOSIS — K219 Gastro-esophageal reflux disease without esophagitis: Secondary | ICD-10-CM | POA: Diagnosis not present

## 2017-04-04 DIAGNOSIS — H2512 Age-related nuclear cataract, left eye: Secondary | ICD-10-CM | POA: Diagnosis present

## 2017-04-04 DIAGNOSIS — Z85828 Personal history of other malignant neoplasm of skin: Secondary | ICD-10-CM | POA: Diagnosis not present

## 2017-04-04 HISTORY — PX: CATARACT EXTRACTION W/PHACO: SHX586

## 2017-04-04 LAB — GLUCOSE, CAPILLARY: Glucose-Capillary: 84 mg/dL (ref 65–99)

## 2017-04-04 SURGERY — PHACOEMULSIFICATION, CATARACT, WITH IOL INSERTION
Anesthesia: Monitor Anesthesia Care | Site: Eye | Laterality: Left | Wound class: Clean

## 2017-04-04 MED ORDER — FENTANYL CITRATE (PF) 100 MCG/2ML IJ SOLN
INTRAMUSCULAR | Status: AC
  Filled 2017-04-04: qty 2

## 2017-04-04 MED ORDER — EPINEPHRINE PF 1 MG/ML IJ SOLN
INTRAMUSCULAR | Status: DC | PRN
  Administered 2017-04-04: 200 mL via OPHTHALMIC

## 2017-04-04 MED ORDER — BACITRACIN-NEOMYCIN-POLYMYXIN 400-5-5000 EX OINT
TOPICAL_OINTMENT | CUTANEOUS | Status: AC
  Filled 2017-04-04: qty 1

## 2017-04-04 MED ORDER — SODIUM CHLORIDE 0.9 % IV SOLN
INTRAVENOUS | Status: DC
  Administered 2017-04-04: 08:00:00 via INTRAVENOUS

## 2017-04-04 MED ORDER — POVIDONE-IODINE 5 % OP SOLN
OPHTHALMIC | Status: AC
  Filled 2017-04-04: qty 30

## 2017-04-04 MED ORDER — MOXIFLOXACIN HCL 0.5 % OP SOLN
OPHTHALMIC | Status: AC
  Administered 2017-04-04: 08:00:00
  Filled 2017-04-04: qty 3

## 2017-04-04 MED ORDER — MOXIFLOXACIN HCL 0.5 % OP SOLN
1.0000 [drp] | OPHTHALMIC | Status: AC
  Administered 2017-04-04 (×3): 1 [drp] via OPHTHALMIC

## 2017-04-04 MED ORDER — ARMC OPHTHALMIC DILATING DROPS
1.0000 "application " | OPHTHALMIC | Status: AC
  Administered 2017-04-04 (×2): 1 via OPHTHALMIC

## 2017-04-04 MED ORDER — ARMC OPHTHALMIC DILATING DROPS
OPHTHALMIC | Status: AC
Start: 2017-04-04 — End: 2017-04-04
  Administered 2017-04-04: 09:00:00
  Filled 2017-04-04: qty 0.4

## 2017-04-04 MED ORDER — NA HYALUR & NA CHOND-NA HYALUR 0.4-0.35 ML IO KIT
PACK | INTRAOCULAR | Status: DC | PRN
  Administered 2017-04-04: .35 mL via INTRAOCULAR

## 2017-04-04 MED ORDER — LIDOCAINE HCL (PF) 4 % IJ SOLN
INTRAMUSCULAR | Status: AC
  Filled 2017-04-04: qty 5

## 2017-04-04 MED ORDER — NEOMYCIN-POLYMYXIN-DEXAMETH 0.1 % OP OINT
TOPICAL_OINTMENT | OPHTHALMIC | Status: DC | PRN
  Administered 2017-04-04: 1 via OPHTHALMIC

## 2017-04-04 MED ORDER — CARBACHOL 0.01 % IO SOLN
INTRAOCULAR | Status: DC | PRN
  Administered 2017-04-04: 0.5 mL via INTRAOCULAR

## 2017-04-04 MED ORDER — LIDOCAINE HCL (PF) 4 % IJ SOLN
INTRAMUSCULAR | Status: DC | PRN
  Administered 2017-04-04: 1 mL via OPHTHALMIC

## 2017-04-04 MED ORDER — EPINEPHRINE PF 1 MG/ML IJ SOLN
INTRAMUSCULAR | Status: AC
  Filled 2017-04-04: qty 1

## 2017-04-04 MED ORDER — FENTANYL CITRATE (PF) 100 MCG/2ML IJ SOLN
INTRAMUSCULAR | Status: DC | PRN
  Administered 2017-04-04 (×2): 25 ug via INTRAVENOUS
  Administered 2017-04-04: 50 ug via INTRAVENOUS

## 2017-04-04 MED ORDER — POVIDONE-IODINE 5 % OP SOLN
OPHTHALMIC | Status: DC | PRN
  Administered 2017-04-04: 1 via OPHTHALMIC

## 2017-04-04 SURGICAL SUPPLY — 18 items
GLOVE BIO SURGEON STRL SZ8 (GLOVE) ×3 IMPLANT
GLOVE BIOGEL M 6.5 STRL (GLOVE) ×3 IMPLANT
GLOVE SURG LX 7.5 STRW (GLOVE) ×2
GLOVE SURG LX STRL 7.5 STRW (GLOVE) ×1 IMPLANT
GOWN STRL REUS W/ TWL LRG LVL3 (GOWN DISPOSABLE) ×2 IMPLANT
GOWN STRL REUS W/TWL LRG LVL3 (GOWN DISPOSABLE) ×4
LABEL CATARACT MEDS ST (LABEL) ×3 IMPLANT
LENS IOL TECNIS ITEC 24.5 (Intraocular Lens) ×3 IMPLANT
PACK CATARACT (MISCELLANEOUS) ×3 IMPLANT
PACK CATARACT BRASINGTON LX (MISCELLANEOUS) ×3 IMPLANT
PACK EYE AFTER SURG (MISCELLANEOUS) ×3 IMPLANT
RING MALYGIN (MISCELLANEOUS) ×3 IMPLANT
SOL BSS BAG (MISCELLANEOUS) ×3
SOLUTION BSS BAG (MISCELLANEOUS) ×1 IMPLANT
SYR 3ML LL SCALE MARK (SYRINGE) ×3 IMPLANT
SYR 5ML LL (SYRINGE) ×3 IMPLANT
WATER STERILE IRR 250ML POUR (IV SOLUTION) ×3 IMPLANT
WIPE NON LINTING 3.25X3.25 (MISCELLANEOUS) ×3 IMPLANT

## 2017-04-04 NOTE — H&P (Signed)
The History and Physical notes are on paper, have been signed, and are to be scanned. The patient remains stable and unchanged from the H&P.   Previous H&P reviewed, patient examined, and there are no changes.  Stacy Cook 04/04/2017 8:08 AM

## 2017-04-04 NOTE — Anesthesia Post-op Follow-up Note (Signed)
Anesthesia QCDR form completed.        

## 2017-04-04 NOTE — Anesthesia Postprocedure Evaluation (Signed)
Anesthesia Post Note  Patient: Stacy Cook  Procedure(s) Performed: CATARACT EXTRACTION PHACO AND INTRAOCULAR LENS PLACEMENT ([place]) (Left Eye)  Patient location during evaluation: PACU Anesthesia Type: MAC Level of consciousness: awake and alert and oriented Pain management: pain level controlled Vital Signs Assessment: post-procedure vital signs reviewed and stable Respiratory status: respiratory function stable Cardiovascular status: blood pressure returned to baseline Anesthetic complications: no     Last Vitals:  Vitals:   04/04/17 0720  BP: (!) 175/51  Pulse: (!) 57  Resp: 16  Temp: 36.5 C  SpO2: 100%    Last Pain:  Vitals:   04/04/17 0720  TempSrc: Oral                 Blima Singer

## 2017-04-04 NOTE — Discharge Instructions (Signed)
Eye Surgery Discharge Instructions  Expect mild scratchy sensation or mild soreness. DO NOT RUB YOUR EYE!  The day of surgery:  Minimal physical activity, but bed rest is not required  No reading, computer work, or close hand work  No bending, lifting, or straining.  May watch TV  For 24 hours:  No driving, legal decisions, or alcoholic beverages  Safety precautions  Eat anything you prefer: It is better to start with liquids, then soup then solid foods.  _____ Eye patch should be worn until postoperative exam tomorrow.  ____ Solar shield eyeglasses should be worn for comfort in the sunlight/patch while sleeping  Resume all regular medications including aspirin or Coumadin if these were discontinued prior to surgery. You may shower, bathe, shave, or wash your hair. Tylenol may be taken for mild discomfort.  Call your doctor if you experience significant pain, nausea, or vomiting, fever > 101 or other signs of infection. [phone] or [phone] Specific instructions:  Follow-up Information    Leandrew Koyanagi, MD Follow up.   Specialty:  Ophthalmology Why:  December 28 at 8:25am Contact information: [address]   [place] [address] [phone]

## 2017-04-04 NOTE — Op Note (Signed)
OPERATIVE NOTE  Stacy Cook [id number] 04/04/2017  PREOPERATIVE DIAGNOSIS:   Nuclear sclerotic cataract left eye with miotic pupil      H25.12   POSTOPERATIVE DIAGNOSIS:   Nuclear sclerotic cataract left eye with miotic pupil.     PROCEDURE:  Phacoemulsification with posterior chamber intraocular lens implantation of the left eye which required pupil stretching with the Malyugin pupil expansion device   LENS:   Implant Name Type Inv. Item Serial No. Manufacturer Lot No. LRB No. Used  LENS IOL DIOP 24.5 - [id number] Intraocular Lens LENS IOL DIOP 24.5 [id number]  Left 1        ULTRASOUND TIME: 25 % of 1 minutes, 0 seconds.  CDE 8.5   SURGEON:  Wyonia Hough, MD   ANESTHESIA: Topical with tetracaine drops and 2% Xylocaine jelly, augmented with 1% preservative-free intracameral lidocaine.   COMPLICATIONS:  None.   DESCRIPTION OF PROCEDURE:  The patient was identified in the holding room and transported to the operating room and placed in the supine position under the operating microscope.  The left eye was identified as the operative eye and it was prepped and draped in the usual sterile ophthalmic fashion.   A 1 millimeter clear-corneal paracentesis was made at the 1:30 position.  The anterior chamber was filled with Viscoat viscoelastic.  0.5 ml of preservative-free 1% lidocaine was injected into the anterior chamber.  A 2.4 millimeter keratome was used to make a near-clear corneal incision at the 10:30 position.  A Malyugin pupil expander was then placed through the main incision and into the anterior chamber of the eye.  The edge of the iris was secured on the lip of the pupil expander and it was released, thereby expanding the pupil to approximately 7 millimeters for completion of the cataract surgery.  Additional Viscoat was placed in the anterior chamber.  A cystotome and capsulorrhexis forceps were used to make a curvilinear capsulorrhexis.   Balanced salt solution  was used to hydrodissect and hydrodelineate the lens nucleus.   Phacoemulsification was used in stop and chop fashion to remove the lens, nucleus and epinucleus.  The remaining cortex was aspirated using the irrigation aspiration handpiece.  Additional Provisc was placed into the eye to distend the capsular bag for lens placement.  A lens was then injected into the capsular bag.  The pupil expanding ring was removed using a Kuglen hook and insertion device. The remaining viscoelastic was aspirated from the capsular bag and the anterior chamber.  The anterior chamber was filled with balanced salt solution to inflate to a physiologic pressure.   Wounds were hydrated with balanced salt solution.  The anterior chamber was inflated to a physiologic pressure with balanced salt solution.  No wound leaks were noted. Vigamox 0.2 ml of a 1mg  per ml solution was injected into the anterior chamber for a dose of 0.2 mg of intracameral antibiotic at the completion of the case. Miostat was placed in the eye.  Maxitrol ointment was applied.  The patient was taken to the recovery room in stable condition without complications of anesthesia or surgery.  [name] 04/04/2017, 9:34 AM

## 2017-04-04 NOTE — Anesthesia Procedure Notes (Signed)
Procedure Name: MAC Performed by: Demetrius Charity, CRNA Pre-anesthesia Checklist: Patient identified, Emergency Drugs available, Suction available, Patient being monitored and Timeout performed Oxygen Delivery Method: Nasal cannula

## 2017-04-04 NOTE — Anesthesia Preprocedure Evaluation (Signed)
Anesthesia Evaluation  Patient identified by MRN, date of birth, ID band Patient awake    Reviewed: Allergy & Precautions, H&P , NPO status , Patient's Chart, lab work & pertinent test results, reviewed documented beta blocker date and time   History of Anesthesia Complications Negative for: history of anesthetic complications  Airway Mallampati: III  TM Distance: >3 FB Neck ROM: full    Dental  (+) Dental Advidsory Given, Missing   Pulmonary neg shortness of breath, asthma , neg COPD, neg recent URI,           Cardiovascular Exercise Tolerance: Good hypertension, (-) angina+ CAD and + Peripheral Vascular Disease  (-) Past MI, (-) Cardiac Stents and (-) CABG (-) dysrhythmias + Valvular Problems/Murmurs AS      Neuro/Psych negative neurological ROS  negative psych ROS   GI/Hepatic Neg liver ROS, GERD  ,  Endo/Other  diabetes  Renal/GU negative Renal ROS  negative genitourinary   Musculoskeletal   Abdominal   Peds  Hematology  (+) anemia ,   Anesthesia Other Findings Past Medical History: No date: Anemia No date: Aortic stenosis     Comment:  severe No date: Arthritis     Comment:  back 2016: Basal cell carcinoma of skin 05/09/2015: Cancer of right lung ([place])     Comment:  Dr. Genevive Bi performed Right lower lobe lobectomy.  No date: Cataract No date: Diabetes mellitus without complication (HCC) No date: GERD (gastroesophageal reflux disease)     Comment:  also, history of ulcers No date: Hypertension 07/2016: Malignant tumor of stomach ([place])     Comment:  gastrectomy No date: Murmur No date: Osteoporosis   Reproductive/Obstetrics negative OB ROS                             Anesthesia Physical Anesthesia Plan  ASA: III  Anesthesia Plan: MAC   Post-op Pain Management:    Induction: Intravenous  PONV Risk Score and Plan: Ondansetron  Airway Management Planned: Nasal  Cannula  Additional Equipment:   Intra-op Plan:   Post-operative Plan:   Informed Consent: I have reviewed the patients History and Physical, chart, labs and discussed the procedure including the risks, benefits and alternatives for the proposed anesthesia with the patient or authorized representative who has indicated his/her understanding and acceptance.   Dental Advisory Given  Plan Discussed with: Anesthesiologist, CRNA and Surgeon  Anesthesia Plan Comments:         Anesthesia Quick Evaluation

## 2017-04-04 NOTE — Transfer of Care (Signed)
Immediate Anesthesia Transfer of Care Note  Patient: Stacy Cook  Procedure(s) Performed: CATARACT EXTRACTION PHACO AND INTRAOCULAR LENS PLACEMENT ([place]) (Left Eye)  Patient Location: PACU  Anesthesia Type:MAC  Level of Consciousness: awake, alert  and oriented  Airway & Oxygen Therapy: Patient Spontanous Breathing  Post-op Assessment: Report given to RN and Post -op Vital signs reviewed and stable  Post vital signs: Reviewed and stable  Last Vitals:  Vitals:   04/04/17 0720  BP: (!) 175/51  Pulse: (!) 57  Resp: 16  Temp: 36.5 C  SpO2: 100%    Last Pain:  Vitals:   04/04/17 0720  TempSrc: Oral         Complications: No apparent anesthesia complications

## 2017-05-07 ENCOUNTER — Encounter: Payer: Self-pay | Admitting: *Deleted

## 2017-05-13 ENCOUNTER — Encounter: Payer: Self-pay | Admitting: *Deleted

## 2017-05-15 ENCOUNTER — Ambulatory Visit: Payer: Medicare Other | Admitting: Anesthesiology

## 2017-05-15 ENCOUNTER — Other Ambulatory Visit: Payer: Self-pay

## 2017-05-15 ENCOUNTER — Encounter
Admission: RE | Disposition: A | Discharge status: Home / Self Care | Payer: Self-pay | Source: Ambulatory Visit | Attending: Ophthalmology

## 2017-05-15 ENCOUNTER — Ambulatory Visit
Admission: RE | Admit: 2017-05-15 | Discharge: 2017-05-15 | Disposition: A | Discharge status: Home / Self Care | Payer: Medicare Other | Source: Ambulatory Visit | Attending: Ophthalmology | Admitting: Ophthalmology

## 2017-05-15 DIAGNOSIS — Z888 Allergy status to other drugs, medicaments and biological substances status: Secondary | ICD-10-CM | POA: Diagnosis not present

## 2017-05-15 DIAGNOSIS — Z8614 Personal history of Methicillin resistant Staphylococcus aureus infection: Secondary | ICD-10-CM | POA: Diagnosis not present

## 2017-05-15 DIAGNOSIS — Z903 Acquired absence of stomach [part of]: Secondary | ICD-10-CM | POA: Insufficient documentation

## 2017-05-15 DIAGNOSIS — D649 Anemia, unspecified: Secondary | ICD-10-CM | POA: Insufficient documentation

## 2017-05-15 DIAGNOSIS — Z902 Acquired absence of lung [part of]: Secondary | ICD-10-CM | POA: Insufficient documentation

## 2017-05-15 DIAGNOSIS — I10 Essential (primary) hypertension: Secondary | ICD-10-CM | POA: Insufficient documentation

## 2017-05-15 DIAGNOSIS — I350 Nonrheumatic aortic (valve) stenosis: Secondary | ICD-10-CM | POA: Insufficient documentation

## 2017-05-15 DIAGNOSIS — K219 Gastro-esophageal reflux disease without esophagitis: Secondary | ICD-10-CM | POA: Diagnosis not present

## 2017-05-15 DIAGNOSIS — Z9842 Cataract extraction status, left eye: Secondary | ICD-10-CM | POA: Diagnosis not present

## 2017-05-15 DIAGNOSIS — Z85118 Personal history of other malignant neoplasm of bronchus and lung: Secondary | ICD-10-CM | POA: Diagnosis not present

## 2017-05-15 DIAGNOSIS — R011 Cardiac murmur, unspecified: Secondary | ICD-10-CM | POA: Diagnosis not present

## 2017-05-15 DIAGNOSIS — Z79899 Other long term (current) drug therapy: Secondary | ICD-10-CM | POA: Diagnosis not present

## 2017-05-15 DIAGNOSIS — E1136 Type 2 diabetes mellitus with diabetic cataract: Secondary | ICD-10-CM | POA: Diagnosis not present

## 2017-05-15 DIAGNOSIS — Z8711 Personal history of peptic ulcer disease: Secondary | ICD-10-CM | POA: Diagnosis not present

## 2017-05-15 DIAGNOSIS — Z85028 Personal history of other malignant neoplasm of stomach: Secondary | ICD-10-CM | POA: Insufficient documentation

## 2017-05-15 DIAGNOSIS — Z90710 Acquired absence of both cervix and uterus: Secondary | ICD-10-CM | POA: Diagnosis not present

## 2017-05-15 DIAGNOSIS — Z887 Allergy status to serum and vaccine status: Secondary | ICD-10-CM | POA: Diagnosis not present

## 2017-05-15 DIAGNOSIS — H2511 Age-related nuclear cataract, right eye: Secondary | ICD-10-CM | POA: Diagnosis present

## 2017-05-15 DIAGNOSIS — Z882 Allergy status to sulfonamides status: Secondary | ICD-10-CM | POA: Diagnosis not present

## 2017-05-15 HISTORY — PX: CATARACT EXTRACTION W/PHACO: SHX586

## 2017-05-15 LAB — GLUCOSE, CAPILLARY: Glucose-Capillary: 91 mg/dL (ref 65–99)

## 2017-05-15 SURGERY — PHACOEMULSIFICATION, CATARACT, WITH IOL INSERTION
Anesthesia: Monitor Anesthesia Care | Site: Eye | Laterality: Right | Wound class: Clean

## 2017-05-15 MED ORDER — MOXIFLOXACIN HCL 0.5 % OP SOLN
1.0000 [drp] | OPHTHALMIC | Status: AC
  Administered 2017-05-15 (×3): 1 [drp] via OPHTHALMIC

## 2017-05-15 MED ORDER — EPINEPHRINE PF 1 MG/ML IJ SOLN
INTRAOCULAR | Status: DC | PRN
  Administered 2017-05-15: 1 mL via OPHTHALMIC

## 2017-05-15 MED ORDER — MIDAZOLAM HCL 5 MG/5ML IJ SOLN
INTRAMUSCULAR | Status: DC | PRN
  Administered 2017-05-15: 1 mg via INTRAVENOUS

## 2017-05-15 MED ORDER — CARBACHOL 0.01 % IO SOLN
INTRAOCULAR | Status: DC | PRN
  Administered 2017-05-15: .5 mL via INTRAOCULAR

## 2017-05-15 MED ORDER — NA HYALUR & NA CHOND-NA HYALUR 0.55-0.5 ML IO KIT
PACK | INTRAOCULAR | Status: AC
Start: 2017-05-15 — End: ?
  Filled 2017-05-15: qty 1.05

## 2017-05-15 MED ORDER — ARMC OPHTHALMIC DILATING DROPS
1.0000 "application " | OPHTHALMIC | Status: AC
  Administered 2017-05-15 (×3): 1 via OPHTHALMIC

## 2017-05-15 MED ORDER — LIDOCAINE HCL (PF) 4 % IJ SOLN
INTRAOCULAR | Status: DC | PRN
  Administered 2017-05-15: 2 mL via OPHTHALMIC

## 2017-05-15 MED ORDER — NEOMYCIN-POLYMYXIN-DEXAMETH 3.5-10000-0.1 OP OINT
TOPICAL_OINTMENT | OPHTHALMIC | Status: AC
  Filled 2017-05-15: qty 3.5

## 2017-05-15 MED ORDER — NEOMYCIN-POLYMYXIN-DEXAMETH 0.1 % OP OINT
TOPICAL_OINTMENT | OPHTHALMIC | Status: DC | PRN
  Administered 2017-05-15: 1 via OPHTHALMIC

## 2017-05-15 MED ORDER — SODIUM CHLORIDE 0.9 % IV SOLN
INTRAVENOUS | Status: DC
  Administered 2017-05-15 (×2): via INTRAVENOUS

## 2017-05-15 MED ORDER — EPINEPHRINE PF 1 MG/ML IJ SOLN
INTRAMUSCULAR | Status: AC
  Filled 2017-05-15: qty 1

## 2017-05-15 MED ORDER — ARMC OPHTHALMIC DILATING DROPS
OPHTHALMIC | Status: AC
  Administered 2017-05-15: 1 via OPHTHALMIC
  Filled 2017-05-15: qty 0.4

## 2017-05-15 MED ORDER — POVIDONE-IODINE 5 % OP SOLN
OPHTHALMIC | Status: AC
  Filled 2017-05-15: qty 30

## 2017-05-15 MED ORDER — NA HYALUR & NA CHOND-NA HYALUR 0.4-0.35 ML IO KIT
PACK | INTRAOCULAR | Status: DC | PRN
  Administered 2017-05-15: 1 mL via INTRAOCULAR

## 2017-05-15 MED ORDER — MIDAZOLAM HCL 2 MG/2ML IJ SOLN
INTRAMUSCULAR | Status: AC
  Filled 2017-05-15: qty 2

## 2017-05-15 MED ORDER — FENTANYL CITRATE (PF) 100 MCG/2ML IJ SOLN
INTRAMUSCULAR | Status: AC
  Filled 2017-05-15: qty 2

## 2017-05-15 MED ORDER — LIDOCAINE HCL (PF) 4 % IJ SOLN
INTRAMUSCULAR | Status: AC
  Filled 2017-05-15: qty 5

## 2017-05-15 MED ORDER — MOXIFLOXACIN HCL 0.5 % OP SOLN
OPHTHALMIC | Status: DC | PRN
  Administered 2017-05-15: .2 mL via OPHTHALMIC

## 2017-05-15 MED ORDER — POVIDONE-IODINE 5 % OP SOLN
OPHTHALMIC | Status: DC | PRN
  Administered 2017-05-15: 1 via OPHTHALMIC

## 2017-05-15 MED ORDER — MOXIFLOXACIN HCL 0.5 % OP SOLN
OPHTHALMIC | Status: AC
  Administered 2017-05-15: 1 [drp] via OPHTHALMIC
  Filled 2017-05-15: qty 6

## 2017-05-15 SURGICAL SUPPLY — 16 items
GLOVE BIO SURGEON STRL SZ8 (GLOVE) ×3 IMPLANT
GLOVE BIOGEL M 6.5 STRL (GLOVE) ×3 IMPLANT
GLOVE SURG LX 7.5 STRW (GLOVE) ×2
GLOVE SURG LX STRL 7.5 STRW (GLOVE) ×1 IMPLANT
GOWN STRL REUS W/ TWL LRG LVL3 (GOWN DISPOSABLE) ×2 IMPLANT
GOWN STRL REUS W/TWL LRG LVL3 (GOWN DISPOSABLE) ×4
LABEL CATARACT MEDS ST (LABEL) ×3 IMPLANT
LENS IOL TECNIS ITEC 25.0 (Intraocular Lens) ×3 IMPLANT
PACK CATARACT (MISCELLANEOUS) ×3 IMPLANT
PACK CATARACT BRASINGTON LX (MISCELLANEOUS) ×3 IMPLANT
PACK EYE AFTER SURG (MISCELLANEOUS) ×3 IMPLANT
SOL BSS BAG (MISCELLANEOUS) ×3
SOLUTION BSS BAG (MISCELLANEOUS) ×1 IMPLANT
SYR 5ML LL (SYRINGE) ×3 IMPLANT
WATER STERILE IRR 250ML POUR (IV SOLUTION) ×3 IMPLANT
WIPE NON LINTING 3.25X3.25 (MISCELLANEOUS) ×3 IMPLANT

## 2017-05-15 NOTE — Transfer of Care (Signed)
Immediate Anesthesia Transfer of Care Note  Patient: Stacy Cook  Procedure(s) Performed: CATARACT EXTRACTION PHACO AND INTRAOCULAR LENS PLACEMENT ([facility]) (Right Eye)  Patient Location: PACU  Anesthesia Type:MAC  Level of Consciousness: awake, alert , oriented and patient cooperative  Airway & Oxygen Therapy: Patient Spontanous Breathing  Post-op Assessment: Report given to RN, Post -op Vital signs reviewed and stable and Patient moving all extremities X 4  Post vital signs: Reviewed and stable  Last Vitals:  Vitals:   05/15/17 0905  BP: (!) 157/46  Pulse: 63  Resp: 16  Temp: (!) 35.9 C  SpO2: 100%    Last Pain:  Vitals:   05/15/17 0905  TempSrc: Temporal         Complications: No apparent anesthesia complications

## 2017-05-15 NOTE — Anesthesia Post-op Follow-up Note (Signed)
Anesthesia QCDR form completed.        

## 2017-05-15 NOTE — Op Note (Signed)
LOCATION:  [facility]   PREOPERATIVE DIAGNOSIS:    Nuclear sclerotic cataract right eye. H25.11   POSTOPERATIVE DIAGNOSIS:  Nuclear sclerotic cataract right eye.     PROCEDURE:  Phacoemusification with posterior chamber intraocular lens placement of the right eye   LENS:   Implant Name Type Inv. Item Serial No. Manufacturer Lot No. LRB No. Used  LENS IOL DIOP 25.0 - [id number] Intraocular Lens LENS IOL DIOP 25.0 [id number] AMO  Right 1        ULTRASOUND TIME: 18 % of 1 minutes, 10 seconds.  CDE 12.9   SURGEON:  Wyonia Hough, MD   ANESTHESIA:  Topical with tetracaine drops and 2% Xylocaine jelly, augmented with 1% preservative-free intracameral lidocaine.    COMPLICATIONS:  None.   DESCRIPTION OF PROCEDURE:  The patient was identified in the holding room and transported to the operating room and placed in the supine position under the operating microscope.  The right eye was identified as the operative eye and it was prepped and draped in the usual sterile ophthalmic fashion.   A 1 millimeter clear-corneal paracentesis was made at the 12:00 position.  0.5 ml of preservative-free 1% lidocaine was injected into the anterior chamber. The anterior chamber was filled with Viscoat viscoelastic.  A 2.4 millimeter keratome was used to make a near-clear corneal incision at the 9:00 position.  A curvilinear capsulorrhexis was made with a cystotome and capsulorrhexis forceps.  Balanced salt solution was used to hydrodissect and hydrodelineate the nucleus.   Phacoemulsification was then used in stop and chop fashion to remove the lens nucleus and epinucleus.  The remaining cortex was then removed using the irrigation and aspiration handpiece. Provisc was then placed into the capsular bag to distend it for lens placement.  A lens was then injected into the capsular bag.  The remaining viscoelastic was aspirated.   Wounds were hydrated with balanced salt solution.  The anterior  chamber was inflated to a physiologic pressure with balanced salt solution.  No wound leaks were noted. Vigamox 0.2 ml of a 1mg  per ml solution was injected into the anterior chamber for a dose of 0.2 mg of intracameral antibiotic at the completion of the case.   Timolol and Brimonidine drops were applied to the eye.  The patient was taken to the recovery room in stable condition without complications of anesthesia or surgery.   Bennett Vanscyoc 05/15/2017, 10:54 AM

## 2017-05-15 NOTE — Anesthesia Postprocedure Evaluation (Signed)
Anesthesia Post Note  Patient: Stacy Cook  Procedure(s) Performed: CATARACT EXTRACTION PHACO AND INTRAOCULAR LENS PLACEMENT ([place]) (Right Eye)  Patient location during evaluation: PACU Anesthesia Type: MAC Level of consciousness: awake and alert Pain management: pain level controlled Vital Signs Assessment: post-procedure vital signs reviewed and stable Respiratory status: spontaneous breathing, nonlabored ventilation and respiratory function stable Cardiovascular status: stable and blood pressure returned to baseline Postop Assessment: no apparent nausea or vomiting Anesthetic complications: no     Last Vitals:  Vitals:   05/15/17 0905  BP: (!) 157/46  Pulse: 63  Resp: 16  Temp: (!) 35.9 C  SpO2: 100%    Last Pain:  Vitals:   05/15/17 0905  TempSrc: Temporal                 Silvana Newness A

## 2017-05-15 NOTE — Discharge Instructions (Signed)
FOLLOW DR. Larrie Kass POSTOP EYE DROP SHEET AS REVIEWED.  Eye Surgery Discharge Instructions  Expect mild scratchy sensation or mild soreness. DO NOT RUB YOUR EYE!  The day of surgery:  Minimal physical activity, but bed rest is not required  No reading, computer work, or close hand work  No bending, lifting, or straining.  May watch TV  For 24 hours:  No driving, legal decisions, or alcoholic beverages  Safety precautions  Eat anything you prefer: It is better to start with liquids, then soup then solid foods.  _____ Eye patch should be worn until postoperative exam tomorrow.  ____ Solar shield eyeglasses should be worn for comfort in the sunlight/patch while sleeping  Resume all regular medications including aspirin or Coumadin if these were discontinued prior to surgery. You may shower, bathe, shave, or wash your hair. Tylenol may be taken for mild discomfort.  Call your doctor if you experience significant pain, nausea, or vomiting, fever > 101 or other signs of infection. [phone] or [phone] Specific instructions:  Follow-up Information    Leandrew Koyanagi, MD Follow up.   Specialty:  Ophthalmology Why:  05/16/17 @ 9:00 am  Contact information: [address]   [place] [address] [phone]

## 2017-05-15 NOTE — Anesthesia Preprocedure Evaluation (Signed)
Anesthesia Evaluation  Patient identified by MRN, date of birth, ID band Patient awake    Reviewed: Allergy & Precautions, NPO status , Patient's Chart, lab work & pertinent test results  History of Anesthesia Complications Negative for: history of anesthetic complications  Airway Mallampati: II  TM Distance: >3 FB Neck ROM: Full    Dental no notable dental hx.    Pulmonary neg pulmonary ROS, neg sleep apnea, neg COPD,    breath sounds clear to auscultation- rhonchi (-) wheezing      Cardiovascular hypertension, Pt. on medications + CAD (nonocclusive)  (-) Past MI, (-) Cardiac Stents and (-) CABG + Valvular Problems/Murmurs (severe AS, asymptomatic, followed by cardiology, recommend conservative management) AS  Rhythm:Regular Rate:Normal - Systolic murmurs and - Diastolic murmurs NM stress test 12/03/16: Normal myocardial perfusion scan no evidence of stress-induced  myocardial ischemia ejection fraction of 64% conclusion negative scan  Echo 11/30/16: NORMAL LEFT VENTRICULAR SYSTOLIC FUNCTION WITH MODERATE LVH NORMAL RIGHT VENTRICULAR SYSTOLIC FUNCTION MILD VALVULAR REGURGITATION (mild MR) SEVERE VALVULAR STENOSIS (severe AS, 86.0 mmHg peak grad) MILD AR, MR, TR and PR EF >55%   Neuro/Psych negative neurological ROS  negative psych ROS   GI/Hepatic Neg liver ROS, GERD  ,  Endo/Other  diabetes, Oral Hypoglycemic Agents  Renal/GU negative Renal ROS     Musculoskeletal  (+) Arthritis ,   Abdominal (+) - obese,   Peds  Hematology  (+) anemia ,   Anesthesia Other Findings Past Medical History: No date: Anemia No date: Aortic stenosis     Comment:  severe No date: Arthritis     Comment:  back 2016: Basal cell carcinoma of skin 05/09/2015: Cancer of right lung ([place])     Comment:  Dr. Genevive Bi performed Right lower lobe lobectomy.  No date: Cataract No date: Diabetes mellitus without complication (HCC) No date:  GERD (gastroesophageal reflux disease)     Comment:  also, history of ulcers No date: Hypertension 07/2016: Malignant tumor of stomach ([place])     Comment:  gastrectomy No date: MRSA (methicillin resistant staph aureus) culture positive     Comment:  2008 FROM POSS UTI No date: Murmur No date: Osteoporosis   Reproductive/Obstetrics                             Anesthesia Physical Anesthesia Plan  ASA: III  Anesthesia Plan: MAC   Post-op Pain Management:    Induction: Intravenous  PONV Risk Score and Plan: 2 and Midazolam  Airway Management Planned: Natural Airway  Additional Equipment:   Intra-op Plan:   Post-operative Plan:   Informed Consent: I have reviewed the patients History and Physical, chart, labs and discussed the procedure including the risks, benefits and alternatives for the proposed anesthesia with the patient or authorized representative who has indicated his/her understanding and acceptance.     Plan Discussed with: CRNA and Anesthesiologist  Anesthesia Plan Comments:         Anesthesia Quick Evaluation

## 2017-05-15 NOTE — H&P (Signed)
The History and Physical notes are on paper, have been signed, and are to be scanned. The patient remains stable and unchanged from the H&P.   Previous H&P reviewed, patient examined, and there are no changes.  Stacy Cook 05/15/2017 9:33 AM

## 2017-05-16 ENCOUNTER — Ambulatory Visit
Admission: RE | Admit: 2017-05-16 | Discharge: 2017-05-16 | Disposition: A | Discharge status: Home / Self Care | Payer: Medicare Other | Source: Ambulatory Visit | Attending: Internal Medicine | Admitting: Internal Medicine

## 2017-05-16 DIAGNOSIS — R928 Other abnormal and inconclusive findings on diagnostic imaging of breast: Secondary | ICD-10-CM

## 2017-05-23 ENCOUNTER — Ambulatory Visit
Admission: RE | Admit: 2017-05-23 | Discharge: 2017-05-23 | Disposition: A | Discharge status: Home / Self Care | Payer: Medicare Other | Source: Ambulatory Visit | Attending: Internal Medicine | Admitting: Internal Medicine

## 2017-05-23 ENCOUNTER — Inpatient Hospital Stay: Payer: Medicare Other | Attending: Internal Medicine

## 2017-05-23 ENCOUNTER — Other Ambulatory Visit: Payer: Self-pay | Admitting: *Deleted

## 2017-05-23 DIAGNOSIS — R110 Nausea: Secondary | ICD-10-CM | POA: Diagnosis not present

## 2017-05-23 DIAGNOSIS — N189 Chronic kidney disease, unspecified: Secondary | ICD-10-CM | POA: Insufficient documentation

## 2017-05-23 DIAGNOSIS — K2950 Unspecified chronic gastritis without bleeding: Secondary | ICD-10-CM | POA: Diagnosis not present

## 2017-05-23 DIAGNOSIS — Z8614 Personal history of Methicillin resistant Staphylococcus aureus infection: Secondary | ICD-10-CM | POA: Diagnosis not present

## 2017-05-23 DIAGNOSIS — J470 Bronchiectasis with acute lower respiratory infection: Secondary | ICD-10-CM | POA: Diagnosis not present

## 2017-05-23 DIAGNOSIS — J219 Acute bronchiolitis, unspecified: Secondary | ICD-10-CM | POA: Insufficient documentation

## 2017-05-23 DIAGNOSIS — I129 Hypertensive chronic kidney disease with stage 1 through stage 4 chronic kidney disease, or unspecified chronic kidney disease: Secondary | ICD-10-CM | POA: Insufficient documentation

## 2017-05-23 DIAGNOSIS — C3431 Malignant neoplasm of lower lobe, right bronchus or lung: Secondary | ICD-10-CM | POA: Insufficient documentation

## 2017-05-23 DIAGNOSIS — I700 Atherosclerosis of aorta: Secondary | ICD-10-CM | POA: Insufficient documentation

## 2017-05-23 DIAGNOSIS — Z902 Acquired absence of lung [part of]: Secondary | ICD-10-CM | POA: Diagnosis not present

## 2017-05-23 DIAGNOSIS — J479 Bronchiectasis, uncomplicated: Secondary | ICD-10-CM | POA: Insufficient documentation

## 2017-05-23 DIAGNOSIS — D693 Immune thrombocytopenic purpura: Secondary | ICD-10-CM | POA: Diagnosis not present

## 2017-05-23 DIAGNOSIS — K219 Gastro-esophageal reflux disease without esophagitis: Secondary | ICD-10-CM | POA: Diagnosis not present

## 2017-05-23 DIAGNOSIS — Z8509 Personal history of malignant neoplasm of other digestive organs: Secondary | ICD-10-CM | POA: Diagnosis not present

## 2017-05-23 DIAGNOSIS — D649 Anemia, unspecified: Secondary | ICD-10-CM | POA: Insufficient documentation

## 2017-05-23 DIAGNOSIS — E1122 Type 2 diabetes mellitus with diabetic chronic kidney disease: Secondary | ICD-10-CM | POA: Diagnosis not present

## 2017-05-23 DIAGNOSIS — I350 Nonrheumatic aortic (valve) stenosis: Secondary | ICD-10-CM | POA: Diagnosis not present

## 2017-05-23 DIAGNOSIS — I2510 Atherosclerotic heart disease of native coronary artery without angina pectoris: Secondary | ICD-10-CM | POA: Insufficient documentation

## 2017-05-23 DIAGNOSIS — M810 Age-related osteoporosis without current pathological fracture: Secondary | ICD-10-CM | POA: Insufficient documentation

## 2017-05-23 DIAGNOSIS — Z903 Acquired absence of stomach [part of]: Secondary | ICD-10-CM | POA: Diagnosis not present

## 2017-05-23 DIAGNOSIS — K449 Diaphragmatic hernia without obstruction or gangrene: Secondary | ICD-10-CM | POA: Insufficient documentation

## 2017-05-23 DIAGNOSIS — Z85828 Personal history of other malignant neoplasm of skin: Secondary | ICD-10-CM | POA: Diagnosis not present

## 2017-05-23 LAB — BASIC METABOLIC PANEL
Anion gap: 8 (ref 5–15)
BUN: 33 mg/dL — ABNORMAL HIGH (ref 6–20)
CALCIUM: 9.3 mg/dL (ref 8.9–10.3)
CHLORIDE: 106 mmol/L (ref 101–111)
CO2: 26 mmol/L (ref 22–32)
CREATININE: 1.43 mg/dL — AB (ref 0.44–1.00)
GFR calc non Af Amer: 34 mL/min — ABNORMAL LOW (ref >60)
GFR, EST AFRICAN AMERICAN: 39 mL/min — AB (ref >60)
Glucose, Bld: 101 mg/dL — ABNORMAL HIGH (ref 65–99)
Potassium: 4.7 mmol/L (ref 3.5–5.1)
Sodium: 140 mmol/L (ref 135–145)

## 2017-05-23 LAB — CBC WITH DIFFERENTIAL/PLATELET
BASOS PCT: 1 %
Basophils Absolute: 0 10*3/uL (ref 0–0.1)
EOS ABS: 0.1 10*3/uL (ref 0–0.7)
Eosinophils Relative: 2 %
HEMATOCRIT: 32.5 % — AB (ref 35.0–47.0)
HEMOGLOBIN: 10.8 g/dL — AB (ref 12.0–16.0)
LYMPHS ABS: 1.3 10*3/uL (ref 1.0–3.6)
Lymphocytes Relative: 28 %
MCH: 28.4 pg (ref 26.0–34.0)
MCHC: 33.1 g/dL (ref 32.0–36.0)
MCV: 85.7 fL (ref 80.0–100.0)
MONO ABS: 0.3 10*3/uL (ref 0.2–0.9)
Monocytes Relative: 6 %
NEUTROS PCT: 63 %
Neutro Abs: 3 10*3/uL (ref 1.4–6.5)
Platelets: 107 10*3/uL — ABNORMAL LOW (ref 150–440)
RBC: 3.79 MIL/uL — ABNORMAL LOW (ref 3.80–5.20)
RDW: 14.6 % — AB (ref 11.5–14.5)
WBC: 4.7 10*3/uL (ref 3.6–11.0)

## 2017-05-23 MED ORDER — IOPAMIDOL (ISOVUE-300) INJECTION 61%
60.0000 mL | Freq: Once | INTRAVENOUS | Status: AC | PRN
  Administered 2017-05-23: 60 mL via INTRAVENOUS

## 2017-05-30 ENCOUNTER — Inpatient Hospital Stay (HOSPITAL_BASED_OUTPATIENT_CLINIC_OR_DEPARTMENT_OTHER): Payer: Medicare Other | Admitting: Internal Medicine

## 2017-05-30 ENCOUNTER — Inpatient Hospital Stay: Payer: Medicare Other

## 2017-05-30 VITALS — BP 159/78 | HR 60 | Temp 97.9°F | Resp 16 | Wt 119.0 lb

## 2017-05-30 DIAGNOSIS — K219 Gastro-esophageal reflux disease without esophagitis: Secondary | ICD-10-CM | POA: Diagnosis not present

## 2017-05-30 DIAGNOSIS — C3431 Malignant neoplasm of lower lobe, right bronchus or lung: Secondary | ICD-10-CM

## 2017-05-30 DIAGNOSIS — K449 Diaphragmatic hernia without obstruction or gangrene: Secondary | ICD-10-CM

## 2017-05-30 DIAGNOSIS — J470 Bronchiectasis with acute lower respiratory infection: Secondary | ICD-10-CM

## 2017-05-30 DIAGNOSIS — N189 Chronic kidney disease, unspecified: Secondary | ICD-10-CM | POA: Diagnosis not present

## 2017-05-30 DIAGNOSIS — I2510 Atherosclerotic heart disease of native coronary artery without angina pectoris: Secondary | ICD-10-CM

## 2017-05-30 DIAGNOSIS — D649 Anemia, unspecified: Secondary | ICD-10-CM | POA: Diagnosis not present

## 2017-05-30 DIAGNOSIS — I700 Atherosclerosis of aorta: Secondary | ICD-10-CM | POA: Diagnosis not present

## 2017-05-30 DIAGNOSIS — Z85828 Personal history of other malignant neoplasm of skin: Secondary | ICD-10-CM

## 2017-05-30 DIAGNOSIS — D693 Immune thrombocytopenic purpura: Secondary | ICD-10-CM | POA: Diagnosis not present

## 2017-05-30 DIAGNOSIS — E1122 Type 2 diabetes mellitus with diabetic chronic kidney disease: Secondary | ICD-10-CM

## 2017-05-30 DIAGNOSIS — R110 Nausea: Secondary | ICD-10-CM

## 2017-05-30 DIAGNOSIS — M810 Age-related osteoporosis without current pathological fracture: Secondary | ICD-10-CM

## 2017-05-30 DIAGNOSIS — I129 Hypertensive chronic kidney disease with stage 1 through stage 4 chronic kidney disease, or unspecified chronic kidney disease: Secondary | ICD-10-CM | POA: Diagnosis not present

## 2017-05-30 DIAGNOSIS — I350 Nonrheumatic aortic (valve) stenosis: Secondary | ICD-10-CM

## 2017-05-30 DIAGNOSIS — Z8509 Personal history of malignant neoplasm of other digestive organs: Secondary | ICD-10-CM

## 2017-05-30 DIAGNOSIS — J219 Acute bronchiolitis, unspecified: Secondary | ICD-10-CM

## 2017-05-30 DIAGNOSIS — K2950 Unspecified chronic gastritis without bleeding: Secondary | ICD-10-CM

## 2017-05-30 DIAGNOSIS — Z8614 Personal history of Methicillin resistant Staphylococcus aureus infection: Secondary | ICD-10-CM

## 2017-05-30 LAB — IRON AND TIBC
Iron: 37 ug/dL (ref 28–170)
Saturation Ratios: 9 % — ABNORMAL LOW (ref 10.4–31.8)
TIBC: 413 ug/dL (ref 250–450)
UIBC: 376 ug/dL

## 2017-05-30 LAB — FERRITIN: Ferritin: 19 ng/mL (ref 11–307)

## 2017-05-30 NOTE — Assessment & Plan Note (Addendum)
#    Right lung adenocarcinoma stage IB [ positive for  Visceral pleural invasion/ size 2.3 cm] Status post lobectomy. No adjuvant chemo.  CT scan February 2019-a 5 mm nodule right lower lobe [a year ago approximately 2 mm]-question metastatic disease [lung versus gastric] versus new primary.   # RLL lung nodule- ~38mm Long discussion with the patient that it is difficult to assess given the small size of the nodule.  As recommended would recommend a CT scan in approximately 3 months  # Signet ring adenocarcinoma- gastric incisura; STAGE I; status post partial gastrectomy. No adjuvant therapy. Recent endoscopy-September 2018 negative for any recurrence; positive for inflammation.  # Mild anemia- hemoglobin 10.8 addendum iron studies asymptomatic. Monitor for now question malabsorption. We will check iron studies today.  Recommend p.o. iron.  If significantly low would recommend IV iron.  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 Asymptomatic. Continue surveillance  #CKD creatinine 1.48; recommend increase hydration.  Monitor closely if worse recommend referral to nephrology.  # follow upin 3 months- CT scan prior/labs. I iron studies/ferrtin-today.  Addendum: Iron studies ferritin 17 saturation 9%.  Recommend p.o. iron once a day.  If poor tolerance/no improvement then recommend IV iron

## 2017-05-30 NOTE — Progress Notes (Signed)
[name]  Patient Care Team: Adin Hector, MD as PCP - General (Internal Medicine) Cammie Sickle, MD as Consulting Physician (Internal Medicine) Christene Lye, MD (General Surgery)  CHIEF COMPLAINTS/PURPOSE OF CONSULTATION:   Oncology History   # DEC 2017- Adeno ca [GATA; her 2 Neu-NEG]; signet ring [1.5 x3 mm gastric incisura; Dr.Skulskie]; EUS [Dr.Burnbridge]; no significant abnormality noted;  Reviewed at [facility] also. JAN 2018- PET NED. April 2018- S/p partial gastrectomy [Dr.Sankar]- STAGE I ADENO CA; NO adjuvant therapy.  # STAGE I CARCINOID s/p partial gastrectomy   # May 2018- Chronic Atrophic gastritis- Prevpac   # FEB 2017- ADENOCARCINOMA with Lepidic 80%-20% acinar pattern; pT2a [Stage IB;T-2.3cm; visceral pleural invasion present; pN=0 ]; AUG 2017- CT NED       Primary malignant neoplasm of right lower lobe of lung ([place])   12/02/2015 Initial Diagnosis    Primary malignant neoplasm of right lower lobe of lung (HCC)       Adenocarcinoma of gastric cardia (HCC)     HISTORY OF PRESENTING ILLNESS:  Stacy Cook 82 y.o.  female patient Stage I lung cancer adenocarcinoma; And history of gastric adenocarcinoma status post partial gastrectomy is here for follow-up/CT scans.  Patient denies any unusual shortness of breath or cough.  Denies any abdominal pain nausea vomiting.  Chronic mild bruising otherwise not any worse.  Patient states that she had intermittent chest pain especially after walking; which improves after stopping/taking rest.  She is awaiting to be evaluated by her cardiologist this week.  No blood in stools black stools. No fevers or chills.  ROS: A complete 10 point review of system is done which is negative except mentioned above in history of present illness  MEDICAL HISTORY:  Past Medical History:  Diagnosis Date  . Anemia   . Aortic stenosis    severe  . Arthritis    back  . Basal cell  carcinoma of skin 2016  . Cancer of right lung ([place]) 05/09/2015   Dr. Genevive Bi performed Right lower lobe lobectomy.   . Cataract   . Diabetes mellitus without complication ([place])   . GERD (gastroesophageal reflux disease)    also, history of ulcers  . Hypertension   . Malignant tumor of stomach ([place]) 07/2016   gastrectomy  . MRSA (methicillin resistant staph aureus) culture positive    2008 FROM POSS UTI  . Murmur   . Osteoporosis     SURGICAL HISTORY: Past Surgical History:  Procedure Laterality Date  . ABDOMINAL HYSTERECTOMY    . APPENDECTOMY    . CATARACT EXTRACTION W/PHACO Left 04/04/2017   Procedure: CATARACT EXTRACTION PHACO AND INTRAOCULAR LENS PLACEMENT (IOC);  Surgeon: Leandrew Koyanagi, MD;  Location: [facility] ORS;  Service: Ophthalmology;  Laterality: Left;  Lot # [id number] H [place] 1:00 Ap 25% CDE 8.54  . CATARACT EXTRACTION W/PHACO Right 05/15/2017   Procedure: CATARACT EXTRACTION PHACO AND INTRAOCULAR LENS PLACEMENT (IOC);  Surgeon: Leandrew Koyanagi, MD;  Location: [facility] ORS;  Service: Ophthalmology;  Laterality: Right;  [place] 01:10 AP% 18.3 CDE 12.91 Fluid pack lot # [id number]  . ESOPHAGOGASTRODUODENOSCOPY (EGD) WITH PROPOFOL N/A 03/27/2016   Procedure: ESOPHAGOGASTRODUODENOSCOPY (EGD) WITH PROPOFOL;  Surgeon: Lollie Sails, MD;  Location[facility] ENDOSCOPY;  Service: Endoscopy;  Laterality: N/A;  . ESOPHAGOGASTRODUODENOSCOPY (EGD) WITH PROPOFOL N/A 05/28/2016   Procedure: ESOPHAGOGASTRODUODENOSCOPY (EGD) WITH PROPOFOL;  Surgeon: Lollie Sails, MD;  Location: [facility] ENDOSCOPY;  Service: Endoscopy;  Laterality: N/A;  . ESOPHAGOGASTRODUODENOSCOPY (EGD) WITH PROPOFOL  N/A 09/25/2016   Procedure: ESOPHAGOGASTRODUODENOSCOPY (EGD) WITH PROPOFOL;  Surgeon: Christene Lye, MD;  Location: [facility] ENDOSCOPY;  Service: Endoscopy;  Laterality: N/A;  . ESOPHAGOGASTRODUODENOSCOPY (EGD) WITH PROPOFOL N/A 12/18/2016   Procedure: ESOPHAGOGASTRODUODENOSCOPY (EGD) WITH PROPOFOL;  Surgeon:  Christene Lye, MD;  Location: [facility] ENDOSCOPY;  Service: Endoscopy;  Laterality: N/A;  . ESOPHAGOGASTRODUODENOSCOPY (EGD) WITH PROPOFOL N/A 01/23/2017   Procedure: ESOPHAGOGASTRODUODENOSCOPY (EGD) WITH PROPOFOL;  Surgeon: Christene Lye, MD;  Location: [facility] ENDOSCOPY;  Service: Endoscopy;  Laterality: N/A;  . ESOPHAGOGASTRODUODENOSCOPY (EGD) WITH PROPOFOL N/A 03/20/2017   Procedure: ESOPHAGOGASTRODUODENOSCOPY (EGD) WITH PROPOFOL;  Surgeon: Christene Lye, MD;  Location: [facility] ENDOSCOPY;  Service: Endoscopy;  Laterality: N/A;  . FRACTURE SURGERY    . PARTIAL GASTRECTOMY N/A 08/03/2016   Procedure: PARTIAL GASTRECTOMY;  Surgeon: Christene Lye, MD;  Location: [facility] ORS;  Service: General;  Laterality: N/A;  . SHOULDER ARTHROSCOPY W/ CAPSULAR REPAIR Right   . THORACOTOMY Right 05/09/2015   Procedure: THORACOTOMY, RIGHT LOWER LOBECTOMY, BRONCHOSCOPY;  Surgeon: Nestor Lewandowsky, MD;  Location: [facility] ORS;  Service: Thoracic;  Laterality: Right;  . UPPER GI ENDOSCOPY N/A 08/03/2016   Procedure: UPPER  ENDOSCOPY;  Surgeon: Christene Lye, MD;  Location: [facility] ORS;  Service: General;  Laterality: N/A;    SOCIAL HISTORY: Social History   Socioeconomic History  . Marital status: Widowed    Spouse name: Not on file  . Number of children: Not on file  . Years of education: Not on file  . Highest education level: Not on file  Social Needs  . Financial resource strain: Not on file  . Food insecurity - worry: Not on file  . Food insecurity - inability: Not on file  . Transportation needs - medical: Not on file  . Transportation needs - non-medical: Not on file  Occupational History  . Not on file  Tobacco Use  . Smoking status: Never Smoker  . Smokeless tobacco: Never Used  Substance and Sexual Activity  . Alcohol use: No  . Drug use: No  . Sexual activity: Not on file  Other Topics Concern  . Not on file  Social History Narrative  . Not on file    FAMILY  HISTORY: Family History  Problem Relation Age of Onset  . Diabetes Other   . Breast cancer Neg Hx     ALLERGIES:  is allergic to pneumococcal vaccine; statins; and sulfa antibiotics.  MEDICATIONS:  Current Outpatient Medications  Medication Sig Dispense Refill  . cholecalciferol (VITAMIN D) 1000 UNITS tablet Take 1,000 Units by mouth daily.    . cyanocobalamin 1000 MCG tablet Take 1,000 mcg by mouth daily.     Marland Kitchen HYDROcodone-acetaminophen (NORCO) 7.5-325 MG tablet Take 1 tablet by mouth 4 (four) times daily as needed for moderate pain.     Marland Kitchen losartan (COZAAR) 100 MG tablet Take 100 mg by mouth daily.     . NON FORMULARY Place 1 drop into the left eye 2 (two) times daily. Imprimis Eye Drop - Received from MD office    . RABEprazole (ACIPHEX) 20 MG tablet Take 20 mg by mouth daily.    . tizanidine (ZANAFLEX) 2 MG capsule Take 2-4 mg by mouth 2 (two) times daily as needed for muscle spasms.     . metoprolol succinate (TOPROL-XL) 25 MG 24 hr tablet Take 25 mg by mouth at bedtime.      No current facility-administered medications for this visit.       Marland Kitchen  PHYSICAL  EXAMINATION: ECOG PERFORMANCE STATUS: 0 - Asymptomatic  Vitals:   05/30/17 1022  BP: (!) 159/78  Pulse: 60  Resp: 16  Temp: 97.9 F (36.6 C)   Filed Weights   05/30/17 1022  Weight: 119 lb (54 kg)    GENERAL: Well-nourished well-developed; Alert, no distress and comfortable.   Alone EYES: no pallor or icterus OROPHARYNX: no thrush or ulceration; good dentition  NECK: supple, no masses felt LYMPH:  no palpable lymphadenopathy in the cervical, axillary or inguinal regions LUNGS: clear to auscultation and  No wheeze or crackles HEART/CVS: regular rate & rhythm; positive for murmur. No lower extremity edema ABDOMEN: abdomen soft, non-tender and normal bowel sounds; incision well healing. Musculoskeletal:no cyanosis of digits and no clubbing  PSYCH: alert & oriented x 3 with fluent speech NEURO: no focal  motor/sensory deficits     LABORATORY DATA:  I have reviewed the data as listed Lab Results  Component Value Date   WBC 4.7 05/23/2017   HGB 10.8 (L) 05/23/2017   HCT 32.5 (L) 05/23/2017   MCV 85.7 05/23/2017   PLT 107 (L) 05/23/2017   Recent Labs    07/06/16 1015  10/31/16 1033 12/21/16 0846 05/23/17 1033  NA 139   < > 143 139 140  K 4.2   < > 4.2 3.2* 4.7  CL 105   < > 107 105 106  CO2 29   < > 28 26 26   GLUCOSE 101*   < > 91 97 101*  BUN 21*   < > 19 18 33*  CREATININE 1.11*   < > 1.22* 1.19* 1.43*  CALCIUM 9.4   < > 9.5 8.6* 9.3  GFRNONAA 46*   < > 41* 43* 34*  GFRAA 54*   < > 48* 49* 39*  PROT 7.1  --   --   --   --   ALBUMIN 4.0  --   --   --   --   AST 20  --   --   --   --   ALT 12*  --   --   --   --   ALKPHOS 70  --   --   --   --   BILITOT 0.7  --   --   --   --    < > = values in this interval not displayed.    RADIOGRAPHIC STUDIES: I have personally reviewed the radiological images as listed and agreed with the findings in the report. Ct Chest W Contrast  Result Date: 05/23/2017 CLINICAL DATA:  Status post right lower lobectomy 05/09/2015 for primary bronchogenic adenocarcinoma. Restaging. Additional history of partial gastrectomy for gastric cancer in April 2018. EXAM: CT CHEST WITH CONTRAST TECHNIQUE: Multidetector CT imaging of the chest was performed during intravenous contrast administration. CONTRAST:  64mL ISOVUE-300 IOPAMIDOL (ISOVUE-300) INJECTION 61% COMPARISON:  06/01/2016 chest CT. FINDINGS: Cardiovascular: Normal heart size. No significant pericardial fluid/thickening. Three-vessel coronary atherosclerosis. Atherosclerotic nonaneurysmal thoracic aorta. Normal caliber pulmonary arteries. No central pulmonary emboli. Mediastinum/Nodes: Stable hypodense bilateral subcentimeter thyroid nodules. Unremarkable esophagus. No pathologically enlarged axillary, mediastinal or hilar lymph nodes. Lungs/Pleura: No pneumothorax. No pleural effusion. Status post  right lower lobectomy. No acute consolidative airspace disease or lung masses. Solid 5 mm basilar right upper lobe pulmonary nodule (series 3/image 83) is increased from 2 mm on 06/01/2016. Peripheral basilar left lower lobe 6 mm nodular opacity (series 3/image 120) is stable back to the 11/30/2015 chest CT and considered benign. Scattered mild  bronchiectasis in basilar right middle lobe is not appreciably changed, with decreased associated mild patchy tree-in-bud opacities. No additional significant pulmonary nodules. Upper abdomen: Small hiatal hernia. Postsurgical changes from partial gastrectomy with surgical sutures in distal stomach. Musculoskeletal: No aggressive appearing focal osseous lesions. Moderate thoracic spondylosis. IMPRESSION: 1. Enlarging 5 mm solid basilar right upper lobe pulmonary nodule. Small metastatic recurrence cannot be excluded. This nodule remains below PET resolution and is probably too small for tissue sampling at this time. Recommend attention on follow-up chest CT in 3 months. 2. No thoracic adenopathy or other findings suspicious for metastatic disease in the chest. 3. Stable mild bronchiectasis and decreased associated tree-in-bud opacities in the right middle lobe, compatible with nonspecific chronic mild bronchiolitis. 4. Three-vessel coronary atherosclerosis. 5. Small hiatal hernia. Aortic Atherosclerosis (ICD10-I70.0). Electronically Signed   By: Ilona Sorrel M.D.   On: 05/23/2017 12:33   Mm Diag Breast Tomo Bilateral  Result Date: 05/16/2017 CLINICAL DATA:  82 year old patient presents for delayed follow-up of the left breast and annual examination of the right breast. She was scheduled for a left breast stereotactic biopsy for a possible subtle area of distortion on 05/09/2016. At that time, no area of distortion could be identified/reproduced to perform biopsy and a six-month follow-up diagnostic mammogram of the left breast was recommended. The patient was unable to  return for follow-up in 6 months. She is asymptomatic. EXAM: 2D DIGITAL DIAGNOSTIC BILATERAL MAMMOGRAM WITH CAD AND ADJUNCT TOMO COMPARISON:  Previous exam(s). ACR Breast Density Category b: There are scattered areas of fibroglandular density. FINDINGS: Stable parenchymal pattern bilaterally. No architectural distortion, mass, or suspicious microcalcification is identified in either breast to suggest malignancy. Mammographic images were processed with CAD. IMPRESSION: No evidence of malignancy in either breast. RECOMMENDATION: Screening mammogram in one year.(Code:[id number]) I have discussed the findings and recommendations with the patient. Results were also provided in writing at the conclusion of the visit. If applicable, a reminder letter will be sent to the patient regarding the next appointment. BI-RADS CATEGORY  1: Negative. Electronically Signed   By: Curlene Dolphin M.D.   On: 05/16/2017 10:24   IMPRESSION: 1. Enlarging 5 mm solid basilar right upper lobe pulmonary nodule. Small metastatic recurrence cannot be excluded. This nodule remains below PET resolution and is probably too small for tissue sampling at this time. Recommend attention on follow-up chest CT in 3 months. 2. No thoracic adenopathy or other findings suspicious for metastatic disease in the chest. 3. Stable mild bronchiectasis and decreased associated tree-in-bud opacities in the right middle lobe, compatible with nonspecific chronic mild bronchiolitis. 4. Three-vessel coronary atherosclerosis. 5. Small hiatal hernia.  ASSESSMENT & PLAN:  Primary malignant neoplasm of right lower lobe of lung ([place]) #  Right lung adenocarcinoma stage IB [ positive for  Visceral pleural invasion/ size 2.3 cm] Status post lobectomy. No adjuvant chemo.  CT scan February 2019-a 5 mm nodule right lower lobe [a year ago approximately 2 mm]-question metastatic disease [lung versus gastric] versus new primary.   # RLL lung nodule- ~73mm Long discussion  with the patient that it is difficult to assess given the small size of the nodule.  As recommended would recommend a CT scan in approximately 3 months  # Signet ring adenocarcinoma- gastric incisura; STAGE I; status post partial gastrectomy. No adjuvant therapy. Recent endoscopy-September 2018 negative for any recurrence; positive for inflammation.  # Mild anemia- hemoglobin 9.9- asymptomatic. Monitor for now question malabsorption. We will check iron studies today.  Recommend p.o. iron.  If significantly low would recommend IV iron.  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 Asymptomatic. Continue surveillance  #CKD creatinine 1.48; recommend increase hydration.  Monitor closely if worse recommend referral to nephrology.  # follow upin 3 months- CT scan prior/labs. I iron studies/ferrtin-today.    Cammie Sickle, MD 05/31/2017 7:55 AM

## 2017-05-30 NOTE — Progress Notes (Signed)
Patient is here today for a follow up. Patient states no new concerns today. Patient states she had a fall last month, and fractured her right wrist. Patient has been following up with orthopedics and is wearing an arm brace.

## 2017-06-18 ENCOUNTER — Ambulatory Visit: Payer: Medicare Other | Admitting: General Surgery

## 2017-06-18 ENCOUNTER — Encounter: Payer: Self-pay | Admitting: General Surgery

## 2017-06-18 VITALS — BP 120/70 | HR 75 | Resp 12 | Ht 61.0 in | Wt 126.0 lb

## 2017-06-18 DIAGNOSIS — C169 Malignant neoplasm of stomach, unspecified: Secondary | ICD-10-CM | POA: Diagnosis not present

## 2017-06-18 NOTE — Progress Notes (Addendum)
Patient ID: Stacy Cook, female   DOB: 1936-02-16, 82 y.o.   MRN: [id number]  Chief Complaint  Patient presents with  . Follow-up    HPI Stacy Cook is a 82 y.o. female here today for her follow up gastric outlet stenosis. Patient states she is doing well besides a lot of burping.  No dietary intolerance.  Past Medical History:  Diagnosis Date  . Anemia   . Aortic stenosis    severe  . Arthritis    back  . Basal cell carcinoma of skin 2016  . Cancer of right lung ([place]) 05/09/2015   Dr. Genevive Bi performed Right lower lobe lobectomy.   . Cataract   . Diabetes mellitus without complication ([place])   . GERD (gastroesophageal reflux disease)    also, history of ulcers  . Hypertension   . Malignant tumor of stomach ([place]) 07/2016   Adenocarcinoma, diffuse, poorly differentiated, signet ring, hemigastrectomy  . MRSA (methicillin resistant staph aureus) culture positive    2008 FROM POSS UTI  . Murmur   . Osteoporosis     Past Surgical History:  Procedure Laterality Date  . ABDOMINAL HYSTERECTOMY    . APPENDECTOMY    . CATARACT EXTRACTION W/PHACO Left 04/04/2017   Procedure: CATARACT EXTRACTION PHACO AND INTRAOCULAR LENS PLACEMENT (IOC);  Surgeon: Leandrew Koyanagi, MD;  Location: [facility] ORS;  Service: Ophthalmology;  Laterality: Left;  Lot # [id number] H [place] 1:00 Ap 25% CDE 8.54  . CATARACT EXTRACTION W/PHACO Right 05/15/2017   Procedure: CATARACT EXTRACTION PHACO AND INTRAOCULAR LENS PLACEMENT (IOC);  Surgeon: Leandrew Koyanagi, MD;  Location: [facility] ORS;  Service: Ophthalmology;  Laterality: Right;  [place] 01:10 AP% 18.3 CDE 12.91 Fluid pack lot # [id number]  . ESOPHAGOGASTRODUODENOSCOPY (EGD) WITH PROPOFOL N/A 03/27/2016   Procedure: ESOPHAGOGASTRODUODENOSCOPY (EGD) WITH PROPOFOL;  Surgeon: Lollie Sails, MD;  Location: [facility] ENDOSCOPY;  Service: Endoscopy;  Laterality: N/A;  . ESOPHAGOGASTRODUODENOSCOPY (EGD) WITH PROPOFOL N/A 05/28/2016   Procedure: ESOPHAGOGASTRODUODENOSCOPY  (EGD) WITH PROPOFOL;  Surgeon: Lollie Sails, MD;  Location: [facility] ENDOSCOPY;  Service: Endoscopy;  Laterality: N/A;  . ESOPHAGOGASTRODUODENOSCOPY (EGD) WITH PROPOFOL N/A 09/25/2016   Procedure: ESOPHAGOGASTRODUODENOSCOPY (EGD) WITH PROPOFOL;  Surgeon: Christene Lye, MD;  Location: [facility] ENDOSCOPY;  Service: Endoscopy;  Laterality: N/A;  . ESOPHAGOGASTRODUODENOSCOPY (EGD) WITH PROPOFOL N/A 12/18/2016   Procedure: ESOPHAGOGASTRODUODENOSCOPY (EGD) WITH PROPOFOL;  Surgeon: Christene Lye, MD;  Location: [facility] ENDOSCOPY;  Service: Endoscopy;  Laterality: N/A;  . ESOPHAGOGASTRODUODENOSCOPY (EGD) WITH PROPOFOL N/A 01/23/2017   Procedure: ESOPHAGOGASTRODUODENOSCOPY (EGD) WITH PROPOFOL;  Surgeon: Christene Lye, MD;  Location: [facility] ENDOSCOPY;  Service: Endoscopy;  Laterality: N/A;  . ESOPHAGOGASTRODUODENOSCOPY (EGD) WITH PROPOFOL N/A 03/20/2017   Procedure: ESOPHAGOGASTRODUODENOSCOPY (EGD) WITH PROPOFOL;  Surgeon: Christene Lye, MD;  Location: [facility] ENDOSCOPY;  Service: Endoscopy;  Laterality: N/A;  . FRACTURE SURGERY    . PARTIAL GASTRECTOMY N/A 08/03/2016   Hemigastrectomy, Billroth I reconstruction Surgeon: Christene Lye, MD;  Location: [facility] ORS;  Service: General;  Laterality: N/A;  . SHOULDER ARTHROSCOPY W/ CAPSULAR REPAIR Right   . THORACOTOMY Right 05/09/2015   Procedure: THORACOTOMY, RIGHT LOWER LOBECTOMY, BRONCHOSCOPY;  Surgeon: Nestor Lewandowsky, MD;  Location: [facility] ORS;  Service: Thoracic;  Laterality: Right;  . UPPER GI ENDOSCOPY N/A 08/03/2016   Procedure: UPPER  ENDOSCOPY;  Surgeon: Christene Lye, MD;  Location: [facility] ORS;  Service: General;  Laterality: N/A;    Family History  Problem Relation Age of Onset  . Diabetes Other   . Breast cancer Neg  Hx     Social History Social History   Tobacco Use  . Smoking status: Never Smoker  . Smokeless tobacco: Never Used  Substance Use Topics  . Alcohol use: No  . Drug use: No    Allergies   Allergen Reactions  . Pneumococcal Vaccine Itching and Other (See Comments)    Hives and fever  . Statins Other (See Comments)    Joint pain  . Sulfa Antibiotics Nausea And Vomiting    Current Outpatient Medications  Medication Sig Dispense Refill  . cholecalciferol (VITAMIN D) 1000 UNITS tablet Take 1,000 Units by mouth daily.    . cyanocobalamin 1000 MCG tablet Take 1,000 mcg by mouth daily.     Marland Kitchen HYDROcodone-acetaminophen (NORCO) 7.5-325 MG tablet Take 1 tablet by mouth 4 (four) times daily as needed for moderate pain.     . isosorbide dinitrate (ISORDIL) 30 MG tablet Take 30 mg by mouth 4 (four) times daily.    Marland Kitchen losartan (COZAAR) 100 MG tablet Take 100 mg by mouth daily.     . NON FORMULARY Place 1 drop into the left eye 2 (two) times daily. Imprimis Eye Drop - Received from MD office    . RABEprazole (ACIPHEX) 20 MG tablet Take 20 mg by mouth daily.    . tizanidine (ZANAFLEX) 2 MG capsule Take 2-4 mg by mouth 2 (two) times daily as needed for muscle spasms.     . metoprolol succinate (TOPROL-XL) 25 MG 24 hr tablet Take 25 mg by mouth at bedtime.      No current facility-administered medications for this visit.     Review of Systems Review of Systems  Constitutional: Negative.   Respiratory: Negative.   Cardiovascular: Negative.     Blood pressure 120/70, pulse 75, resp. rate 12, height 5\' 1"  (1.549 m), weight 126 lb (57.2 kg). The patient's weight is up 10 pounds from her February 13, 2017 exam.    Physical Exam Physical Exam  Constitutional: She is oriented to person, place, and time. She appears well-developed and well-nourished.  Cardiovascular: Normal rate and regular rhythm.  Murmur heard.  Systolic murmur is present with a grade of 4/6. Pulmonary/Chest: Effort normal and breath sounds normal.  Neurological: She is alert and oriented to person, place, and time.  Skin: Skin is warm and dry.    Data Reviewed Medical oncology notes of May 30, 2017  reviewed.  CBC dated May 23, 2017 showed a hemoglobin of 10.8, MCV of 85, white blood cell count of 4700.  Platelet count of 107,000.  MCV 85.7.  Normal differential.  Upper endoscopy dated March 20, 2017 showed a widely patent anastomosis.  No evidence of recurrent cancer.  August 03, 2016 hemigastrectomy specimen:  A. STOMACH, DISTAL; PARTIAL GASTRECTOMY:  - INVASIVE ADENOCARCINOMA.  - SEE CANCER STAGING SUMMARY BELOW.  - ATROPHIC GASTRITIS.  - WELL DIFFERENTIATED NEUROENDOCRINE TUMOR (CARCINOID), WHO GRADE 1.  - MUCOSAL CHANGES CONSISTENT WITH RECENT BIOPSY. (0.12 cm, incidental finding).   B. LINES OF SEPARATION; EXCISION:  - NEGATIVE FOR MALIGNANCY.    May 28, 2016 upper endoscopy biopsies: A. STOMACH, DISTAL SUPERIOR ANTRUM; COLD BIOPSY:  - ANTRAL MUCOSA WITH HEALING EROSIVE GASTRITIS.  - INTESTINAL METAPLASIA.  - NEGATIVE FOR H. PYLORI, DYSPLASIA, AND MALIGNANCY.   B. STOMACH, ANTERIOR INCISURA; COLD BIOPSY:  - POORLY DIFFERENTIATED ADENOCARCINOMA, SIGNET RING CELL TYPE.  - SEE COMMENT.   C. STOMACH, ANTRUM; COLD BIOPSY:  - ANTRAL MUCOSA WITH HEALING EROSIVE GASTRITIS.  - INTESTINAL METAPLASIA.  -  NEGATIVE FOR H. PYLORI, DYSPLASIA, AND MALIGNANCY.   D. STOMACH, BODY; COLD BIOPSY:  - ANTRAL MUCOSA WITH MODERATE CHRONIC GASTRITIS.  - FOCAL INTESTINAL METAPLASIA.  - IMMUNOHISTOCHEMICAL STAIN FOR H. PYLORI TO BE RESULTED IN AN ADDENDUM.  - NEGATIVE FOR DYSPLASIA AND MALIGNANCY.   March 27, 2016 upper endoscopy biopsy: B. STOMACH ULCER, INCISURA; COLD BIOPSY:  - POORLY DIFFERENTIATED ADENOCARCINOMA WITH SIGNET RING CELL MORPHOLOGY.  - ANTRAL TYPE MUCOSA WITH HEALING MUCOSAL CHANGE.  Assessment    Good weight gain status post anastomotic dilatation last fall.    Plan  Would tentatively plan for a follow-up screening upper endoscopy in 1 year.    HPI, Physical Exam, Assessment and Plan have been scribed under the direction and in the presence of  Hervey Ard, MD.  Gaspar Cola, CMA  I have completed the exam and reviewed the above documentation for accuracy and completeness.  I agree with the above.  Haematologist has been used and any errors in dictation or transcription are unintentional.  Hervey Ard, M.D., F.A.C.S.  Forest Gleason Mozella Rexrode 06/19/2017, 9:14 PM  Case reviewed with both Dr. Acquanetta Belling and Dr. Rogue Bussing.  Follow-up imaging not felt to be of great benefit.  Discussed advisability of a repeat endoscopy 1 year after her last exam (December 2018).  Will review with Dr. Gustavo Lah whether he would like to complete this or it will be handled through this office.

## 2017-06-18 NOTE — Patient Instructions (Signed)
The patient is aware to call back for any questions or concerns.  

## 2017-06-19 ENCOUNTER — Encounter: Payer: Self-pay | Admitting: General Surgery

## 2017-06-24 ENCOUNTER — Telehealth: Payer: Self-pay | Admitting: *Deleted

## 2017-06-24 NOTE — Telephone Encounter (Signed)
-----   Message from Robert Bellow, MD sent at 06/21/2017  8:30 PM EDT ----- Please notify the patient that we will likely arrange for her to have an upper endoscopy in December 2019.  Am in contact with Dr. Marton Redwood office to determine who will take responsibility.

## 2017-06-24 NOTE — Telephone Encounter (Signed)
Notified patient as instructed, patient pleased. Discussed follow-up appointments, patient agrees  

## 2017-08-27 ENCOUNTER — Ambulatory Visit
Admission: RE | Admit: 2017-08-27 | Discharge: 2017-08-27 | Disposition: A | Discharge status: Home / Self Care | Payer: Medicare Other | Source: Ambulatory Visit | Attending: Internal Medicine | Admitting: Internal Medicine

## 2017-08-27 DIAGNOSIS — R911 Solitary pulmonary nodule: Secondary | ICD-10-CM | POA: Insufficient documentation

## 2017-08-27 DIAGNOSIS — C3431 Malignant neoplasm of lower lobe, right bronchus or lung: Secondary | ICD-10-CM | POA: Insufficient documentation

## 2017-08-27 DIAGNOSIS — J479 Bronchiectasis, uncomplicated: Secondary | ICD-10-CM | POA: Insufficient documentation

## 2017-08-27 DIAGNOSIS — I700 Atherosclerosis of aorta: Secondary | ICD-10-CM | POA: Diagnosis not present

## 2017-08-29 ENCOUNTER — Other Ambulatory Visit: Payer: Self-pay

## 2017-08-29 ENCOUNTER — Inpatient Hospital Stay: Payer: Medicare Other | Attending: Internal Medicine

## 2017-08-29 ENCOUNTER — Inpatient Hospital Stay (HOSPITAL_BASED_OUTPATIENT_CLINIC_OR_DEPARTMENT_OTHER): Payer: Medicare Other | Admitting: Internal Medicine

## 2017-08-29 ENCOUNTER — Encounter: Payer: Self-pay | Admitting: Internal Medicine

## 2017-08-29 VITALS — BP 125/53 | HR 62 | Temp 97.9°F | Resp 18 | Ht 61.0 in | Wt 129.2 lb

## 2017-08-29 DIAGNOSIS — R011 Cardiac murmur, unspecified: Secondary | ICD-10-CM | POA: Diagnosis not present

## 2017-08-29 DIAGNOSIS — R5383 Other fatigue: Secondary | ICD-10-CM | POA: Diagnosis not present

## 2017-08-29 DIAGNOSIS — Z79899 Other long term (current) drug therapy: Secondary | ICD-10-CM | POA: Insufficient documentation

## 2017-08-29 DIAGNOSIS — D693 Immune thrombocytopenic purpura: Secondary | ICD-10-CM | POA: Insufficient documentation

## 2017-08-29 DIAGNOSIS — I350 Nonrheumatic aortic (valve) stenosis: Secondary | ICD-10-CM | POA: Diagnosis not present

## 2017-08-29 DIAGNOSIS — Z85828 Personal history of other malignant neoplasm of skin: Secondary | ICD-10-CM | POA: Diagnosis not present

## 2017-08-29 DIAGNOSIS — K219 Gastro-esophageal reflux disease without esophagitis: Secondary | ICD-10-CM | POA: Diagnosis not present

## 2017-08-29 DIAGNOSIS — E119 Type 2 diabetes mellitus without complications: Secondary | ICD-10-CM | POA: Diagnosis not present

## 2017-08-29 DIAGNOSIS — C3431 Malignant neoplasm of lower lobe, right bronchus or lung: Secondary | ICD-10-CM | POA: Diagnosis not present

## 2017-08-29 DIAGNOSIS — C169 Malignant neoplasm of stomach, unspecified: Secondary | ICD-10-CM | POA: Insufficient documentation

## 2017-08-29 DIAGNOSIS — D696 Thrombocytopenia, unspecified: Secondary | ICD-10-CM

## 2017-08-29 DIAGNOSIS — Z8614 Personal history of Methicillin resistant Staphylococcus aureus infection: Secondary | ICD-10-CM

## 2017-08-29 DIAGNOSIS — I10 Essential (primary) hypertension: Secondary | ICD-10-CM

## 2017-08-29 DIAGNOSIS — M810 Age-related osteoporosis without current pathological fracture: Secondary | ICD-10-CM | POA: Diagnosis not present

## 2017-08-29 LAB — CBC WITH DIFFERENTIAL/PLATELET
BASOS PCT: 1 %
Basophils Absolute: 0 10*3/uL (ref 0–0.1)
EOS ABS: 0 10*3/uL (ref 0–0.7)
EOS PCT: 1 %
HCT: 31.1 % — ABNORMAL LOW (ref 35.0–47.0)
Hemoglobin: 10.7 g/dL — ABNORMAL LOW (ref 12.0–16.0)
LYMPHS ABS: 1.1 10*3/uL (ref 1.0–3.6)
Lymphocytes Relative: 22 %
MCH: 30.5 pg (ref 26.0–34.0)
MCHC: 34.4 g/dL (ref 32.0–36.0)
MCV: 88.6 fL (ref 80.0–100.0)
MONOS PCT: 8 %
Monocytes Absolute: 0.4 10*3/uL (ref 0.2–0.9)
Neutro Abs: 3.3 10*3/uL (ref 1.4–6.5)
Neutrophils Relative %: 68 %
PLATELETS: 134 10*3/uL — AB (ref 150–440)
RBC: 3.5 MIL/uL — ABNORMAL LOW (ref 3.80–5.20)
RDW: 13.2 % (ref 11.5–14.5)
WBC: 4.8 10*3/uL (ref 3.6–11.0)

## 2017-08-29 LAB — BASIC METABOLIC PANEL
Anion gap: 8 (ref 5–15)
BUN: 41 mg/dL — AB (ref 6–20)
CHLORIDE: 109 mmol/L (ref 101–111)
CO2: 21 mmol/L — AB (ref 22–32)
CREATININE: 1.54 mg/dL — AB (ref 0.44–1.00)
Calcium: 9.2 mg/dL (ref 8.9–10.3)
GFR calc Af Amer: 36 mL/min — ABNORMAL LOW (ref >60)
GFR calc non Af Amer: 31 mL/min — ABNORMAL LOW (ref >60)
Glucose, Bld: 82 mg/dL (ref 65–99)
Potassium: 3.8 mmol/L (ref 3.5–5.1)
SODIUM: 138 mmol/L (ref 135–145)

## 2017-08-29 NOTE — Progress Notes (Signed)
[place] OFFICE PROGRESS NOTE  Patient Care Team: Adin Hector, MD as PCP - General (Internal Medicine) Cammie Sickle, MD as Consulting Physician (Internal Medicine) Christene Lye, MD (General Surgery)  Cancer Staging Primary malignant neoplasm of right lower lobe of lung [facility]) Staging form: Lung, AJCC 7th Edition - Clinical: No stage assigned - Unsigned    Oncology History   # DEC 2017- Adeno ca [GATA; her 2 Neu-NEG]; signet ring [1.5 x3 mm gastric incisura; Dr.Skulskie]; EUS [Dr.Burnbridge]; no significant abnormality noted;  Reviewed at [facility] also. JAN 2018- PET NED. April 2018- S/p partial gastrectomy [Dr.Sankar]- STAGE I ADENO CA; NO adjuvant therapy.  # STAGE I CARCINOID s/p partial gastrectomy   # May 2018- Chronic Atrophic gastritis- Prevpac   # FEB 2017- ADENOCARCINOMA with Lepidic 80%-20% acinar pattern; pT2a [Stage IB;T-2.3cm; visceral pleural invasion present; pN=0 ]; AUG 2017- CT NED   DIAGNOSIS: RLL lung adeno ca [Stage I; feb 2017]; Stomach adeno ca [stage I; dec 2017]  GOALS: Curative  CURRENT/MOST RECENT THERAPY - surveillance       Primary malignant neoplasm of right lower lobe of lung ([place])   12/02/2015 Initial Diagnosis    Primary malignant neoplasm of right lower lobe of lung ([facility])       Adenocarcinoma of gastric cardia ([place])      INTERVAL HISTORY:  Stacy Cook 82 y.o.  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  Patient complains of worsening fatigue.  She is not on p.o. iron because of intolerance.  No blood in stools or black or stools.  Review of Systems  Constitutional: Positive for malaise/fatigue. Negative for chills, diaphoresis, fever and weight loss.  HENT: Negative for nosebleeds and sore throat.   Eyes: Negative for double  vision.  Respiratory: Negative for cough, hemoptysis, sputum production, shortness of breath and wheezing.   Cardiovascular: Negative for chest pain, palpitations, orthopnea and leg swelling.  Gastrointestinal: Negative for abdominal pain, blood in stool, constipation, diarrhea, heartburn, melena, nausea and vomiting.  Genitourinary: Negative for dysuria, frequency and urgency.  Musculoskeletal: Negative for back pain and joint pain.  Skin: Negative.  Negative for itching and rash.  Neurological: Negative for dizziness, tingling, focal weakness, weakness and headaches.  Endo/Heme/Allergies: Does not bruise/bleed easily.  Psychiatric/Behavioral: Negative for depression. The patient is not nervous/anxious and does not have insomnia.       PAST MEDICAL HISTORY :  Past Medical History:  Diagnosis Date  . Anemia   . Aortic stenosis    severe  . Arthritis    back  . Basal cell carcinoma of skin 2016  . Cancer of right lung ([place]) 05/09/2015   Dr. Genevive Bi performed Right lower lobe lobectomy.   . Cataract   . Diabetes mellitus without complication ([place])   . GERD (gastroesophageal reflux disease)    also, history of ulcers  . Hypertension   . Malignant tumor of stomach ([place]) 07/2016   Adenocarcinoma, diffuse, poorly differentiated, signet ring, stage I  . MRSA (methicillin resistant staph aureus) culture positive    2008 FROM POSS UTI  . Murmur   . Osteoporosis     PAST SURGICAL HISTORY :   Past Surgical History:  Procedure Laterality Date  . ABDOMINAL HYSTERECTOMY    . APPENDECTOMY    . CATARACT EXTRACTION W/PHACO Left 04/04/2017  Procedure: CATARACT EXTRACTION PHACO AND INTRAOCULAR LENS PLACEMENT (IOC);  Surgeon: Leandrew Koyanagi, MD;  Location: [facility] ORS;  Service: Ophthalmology;  Laterality: Left;  Lot # [id number] H [place] 1:00 Ap 25% CDE 8.54  . CATARACT EXTRACTION W/PHACO Right 05/15/2017   Procedure: CATARACT EXTRACTION PHACO AND INTRAOCULAR LENS PLACEMENT (IOC);  Surgeon:  Leandrew Koyanagi, MD;  Location: [facility] ORS;  Service: Ophthalmology;  Laterality: Right;  [place] 01:10 AP% 18.3 CDE 12.91 Fluid pack lot # [id number]  . ESOPHAGOGASTRODUODENOSCOPY (EGD) WITH PROPOFOL N/A 03/27/2016   Procedure: ESOPHAGOGASTRODUODENOSCOPY (EGD) WITH PROPOFOL;  Surgeon: Lollie Sails, MD;  Location: [facility] ENDOSCOPY;  Service: Endoscopy;  Laterality: N/A;  . ESOPHAGOGASTRODUODENOSCOPY (EGD) WITH PROPOFOL N/A 05/28/2016   Procedure: ESOPHAGOGASTRODUODENOSCOPY (EGD) WITH PROPOFOL;  Surgeon: Lollie Sails, MD;  Location: [facility] ENDOSCOPY;  Service: Endoscopy;  Laterality: N/A;  . ESOPHAGOGASTRODUODENOSCOPY (EGD) WITH PROPOFOL N/A 09/25/2016   Procedure: ESOPHAGOGASTRODUODENOSCOPY (EGD) WITH PROPOFOL;  Surgeon: Christene Lye, MD;  Location: [facility] ENDOSCOPY;  Service: Endoscopy;  Laterality: N/A;  . ESOPHAGOGASTRODUODENOSCOPY (EGD) WITH PROPOFOL N/A 12/18/2016   Procedure: ESOPHAGOGASTRODUODENOSCOPY (EGD) WITH PROPOFOL;  Surgeon: Christene Lye, MD;  Location: [facility] ENDOSCOPY;  Service: Endoscopy;  Laterality: N/A;  . ESOPHAGOGASTRODUODENOSCOPY (EGD) WITH PROPOFOL N/A 01/23/2017   Procedure: ESOPHAGOGASTRODUODENOSCOPY (EGD) WITH PROPOFOL;  Surgeon: Christene Lye, MD;  Location: [facility] ENDOSCOPY;  Service: Endoscopy;  Laterality: N/A;  . ESOPHAGOGASTRODUODENOSCOPY (EGD) WITH PROPOFOL N/A 03/20/2017   Procedure: ESOPHAGOGASTRODUODENOSCOPY (EGD) WITH PROPOFOL;  Surgeon: Christene Lye, MD;  Location: [facility] ENDOSCOPY;  Service: Endoscopy;  Laterality: N/A;  . FRACTURE SURGERY    . PARTIAL GASTRECTOMY N/A 08/03/2016   Hemigastrectomy, [place]th I reconstruction Surgeon: Christene Lye, MD;  Location: [facility] ORS;  Service: General;  Laterality: N/A;  . SHOULDER ARTHROSCOPY W/ CAPSULAR REPAIR Right   . THORACOTOMY Right 05/09/2015   Procedure: THORACOTOMY, RIGHT LOWER LOBECTOMY, BRONCHOSCOPY;  Surgeon: Nestor Lewandowsky, MD;  Location: [facility] ORS;  Service: Thoracic;   Laterality: Right;  . UPPER GI ENDOSCOPY N/A 08/03/2016   Procedure: UPPER  ENDOSCOPY;  Surgeon: Christene Lye, MD;  Location: [facility] ORS;  Service: General;  Laterality: N/A;    FAMILY HISTORY :   Family History  Problem Relation Age of Onset  . Diabetes Other   . Breast cancer Neg Hx     SOCIAL HISTORY:   Social History   Tobacco Use  . Smoking status: Never Smoker  . Smokeless tobacco: Never Used  Substance Use Topics  . Alcohol use: No  . Drug use: No    ALLERGIES:  is allergic to pneumococcal vaccine; statins; and sulfa antibiotics.  MEDICATIONS:  Current Outpatient Medications  Medication Sig Dispense Refill  . cholecalciferol (VITAMIN D) 1000 UNITS tablet Take 1,000 Units by mouth daily.    . cyanocobalamin 1000 MCG tablet Take 1,000 mcg by mouth daily.     Marland Kitchen HYDROcodone-acetaminophen (NORCO) 7.5-325 MG tablet Take 1 tablet by mouth 4 (four) times daily as needed for moderate pain.     . isosorbide dinitrate (ISORDIL) 30 MG tablet Take 30 mg by mouth 4 (four) times daily.    Marland Kitchen losartan (COZAAR) 100 MG tablet Take 100 mg by mouth daily.     . metoprolol succinate (TOPROL-XL) 25 MG 24 hr tablet Take 25 mg by mouth at bedtime.     . NON FORMULARY Place 1 drop into the left eye 2 (two) times daily. Imprimis Eye Drop - Received from MD office    . RABEprazole (ACIPHEX) 20 MG tablet  Take 20 mg by mouth daily.    . tizanidine (ZANAFLEX) 2 MG capsule Take 2-4 mg by mouth 2 (two) times daily as needed for muscle spasms.      No current facility-administered medications for this visit.     PHYSICAL EXAMINATION: ECOG PERFORMANCE STATUS: 0 - Asymptomatic  BP (!) 125/53 (BP Location: Right Arm, Patient Position: Sitting)   Pulse 62   Temp 97.9 F (36.6 C) (Tympanic)   Resp 18   Ht 5\' 1"  (1.549 m)   Wt 129 lb 3.2 oz (58.6 kg)   SpO2 99%   BMI 24.41 kg/m   Filed Weights   08/29/17 1037  Weight: 129 lb 3.2 oz (58.6 kg)    GENERAL: Well-nourished well-developed;  Alert, no distress and comfortable. Alone. Marland Kitchen  EYES: no pallor or icterus OROPHARYNX: no thrush or ulceration; NECK: supple; no lymph nodes felt. LYMPH:  no palpable lymphadenopathy in the axillary or inguinal regions LUNGS: Decreased breath sounds auscultation bilaterally. No wheeze or crackles HEART/CVS: regular rate & rhythm and no murmurs; No lower extremity edema ABDOMEN:abdomen soft, non-tender and normal bowel sounds. No hepatomegaly or splenomegaly.  Musculoskeletal:no cyanosis of digits and no clubbing  PSYCH: alert & oriented x 3 with fluent speech NEURO: no focal motor/sensory deficits SKIN:  no rashes or significant lesions    LABORATORY DATA:  I have reviewed the data as listed    Component Value Date/Time   NA 138 08/29/2017 0953   NA 141 07/21/2012 1529   K 3.8 08/29/2017 0953   K 3.9 07/21/2012 1529   CL 109 08/29/2017 0953   CL 109 (H) 07/21/2012 1529   CO2 21 (L) 08/29/2017 0953   CO2 27 07/21/2012 1529   GLUCOSE 82 08/29/2017 0953   GLUCOSE 104 (H) 07/21/2012 1529   BUN 41 (H) 08/29/2017 0953   BUN 16 07/21/2012 1529   CREATININE 1.54 (H) 08/29/2017 0953   CREATININE 1.11 09/03/2012 1535   CALCIUM 9.2 08/29/2017 0953   CALCIUM 8.9 07/21/2012 1529   PROT 7.1 07/06/2016 1015   PROT 7.1 07/21/2012 1529   ALBUMIN 4.0 07/06/2016 1015   ALBUMIN 3.9 07/21/2012 1529   AST 20 07/06/2016 1015   AST 26 07/21/2012 1529   ALT 12 (L) 07/06/2016 1015   ALT 22 07/21/2012 1529   ALKPHOS 70 07/06/2016 1015   ALKPHOS 76 07/21/2012 1529   BILITOT 0.7 07/06/2016 1015   BILITOT 0.3 07/21/2012 1529   GFRNONAA 31 (L) 08/29/2017 0953   GFRNONAA 49 (L) 09/03/2012 1535   GFRAA 36 (L) 08/29/2017 0953   GFRAA 57 (L) 09/03/2012 1535    No results found for: SPEP, UPEP  Lab Results  Component Value Date   WBC 4.8 08/29/2017   NEUTROABS 3.3 08/29/2017   HGB 10.7 (L) 08/29/2017   HCT 31.1 (L) 08/29/2017   MCV 88.6 08/29/2017   PLT 134 (L) 08/29/2017      Chemistry       Component Value Date/Time   NA 138 08/29/2017 0953   NA 141 07/21/2012 1529   K 3.8 08/29/2017 0953   K 3.9 07/21/2012 1529   CL 109 08/29/2017 0953   CL 109 (H) 07/21/2012 1529   CO2 21 (L) 08/29/2017 0953   CO2 27 07/21/2012 1529   BUN 41 (H) 08/29/2017 0953   BUN 16 07/21/2012 1529   CREATININE 1.54 (H) 08/29/2017 0953   CREATININE 1.11 09/03/2012 1535      Component Value Date/Time   CALCIUM 9.2 08/29/2017  7209   CALCIUM 8.9 07/21/2012 1529   ALKPHOS 70 07/06/2016 1015   ALKPHOS 76 07/21/2012 1529   AST 20 07/06/2016 1015   AST 26 07/21/2012 1529   ALT 12 (L) 07/06/2016 1015   ALT 22 07/21/2012 1529   BILITOT 0.7 07/06/2016 1015   BILITOT 0.3 07/21/2012 1529       RADIOGRAPHIC STUDIES: I have personally reviewed the radiological images as listed and agreed with the findings in the report. No results found.   ASSESSMENT & PLAN:  Primary malignant neoplasm of right lower lobe of lung ([place]) #  Right lung adenocarcinoma stage IB [ positive for  Visceral pleural invasion/ size 2.3 cm] Status post lobectomy.  CT scan-Aug 27, 2017 no evidence of recurrence; see discussion below  #5 mm right middle lobe nodule-stable; CT scan monitor closely.  # Signet ring adenocarcinoma- gastric incisura; STAGE I; status post partial gastrectomy.  Awaiting repeat endoscopy with GI.  #Mild to moderate anemia-worsened; symptomatic with worsening fatigue- hemoglobin 10; saturation 9%-question malabsorption versus others; recommend IV for weekly x4.   # chronic ITP- platelet today 134-stable. Asymptomatic. Continue surveillance  #Creatinine 1.5; stable  # follow up in 4 months/cbc/possible Venofer; venofer weekly x4.    Orders Placed This Encounter  Procedures  . CBC with Differential    Standing Status:   Future    Standing Expiration Date:   08/30/2018  . Basic metabolic panel    Standing Status:   Future    Standing Expiration Date:   08/30/2018   All questions were  answered. The patient knows to call the clinic with any problems, questions or concerns.      Cammie Sickle, MD 09/03/2017 8:25 PM

## 2017-08-29 NOTE — Progress Notes (Signed)
No new changes noted today 

## 2017-08-29 NOTE — Assessment & Plan Note (Addendum)
#    Right lung adenocarcinoma stage IB [ positive for  Visceral pleural invasion/ size 2.3 cm] Status post lobectomy.  CT scan-Aug 27, 2017 no evidence of recurrence; see discussion below  #5 mm right middle lobe nodule-stable; CT scan monitor closely.  # Signet ring adenocarcinoma- gastric incisura; STAGE I; status post partial gastrectomy.  Awaiting repeat endoscopy with GI.  #Mild to moderate anemia-worsened; symptomatic with worsening fatigue- hemoglobin 10; saturation 9%-question malabsorption versus others; recommend IV for weekly x4.   # chronic ITP- platelet today 134-stable. Asymptomatic. Continue surveillance  #Creatinine 1.5; stable  # follow up in 4 months/cbc/possible Venofer; venofer weekly x4.

## 2017-09-09 ENCOUNTER — Inpatient Hospital Stay: Payer: Medicare Other | Attending: Internal Medicine

## 2017-09-09 ENCOUNTER — Other Ambulatory Visit: Payer: Self-pay | Admitting: Internal Medicine

## 2017-09-09 VITALS — BP 147/57 | HR 57 | Temp 98.0°F | Resp 18

## 2017-09-09 DIAGNOSIS — D508 Other iron deficiency anemias: Secondary | ICD-10-CM

## 2017-09-09 DIAGNOSIS — Z79899 Other long term (current) drug therapy: Secondary | ICD-10-CM | POA: Insufficient documentation

## 2017-09-09 DIAGNOSIS — I129 Hypertensive chronic kidney disease with stage 1 through stage 4 chronic kidney disease, or unspecified chronic kidney disease: Secondary | ICD-10-CM | POA: Insufficient documentation

## 2017-09-09 DIAGNOSIS — E1122 Type 2 diabetes mellitus with diabetic chronic kidney disease: Secondary | ICD-10-CM | POA: Insufficient documentation

## 2017-09-09 DIAGNOSIS — C160 Malignant neoplasm of cardia: Secondary | ICD-10-CM

## 2017-09-09 HISTORY — DX: Other iron deficiency anemias: D50.8

## 2017-09-09 MED ORDER — SODIUM CHLORIDE 0.9 % IV SOLN
Freq: Once | INTRAVENOUS | Status: AC
  Administered 2017-09-09: 14:00:00 via INTRAVENOUS
  Filled 2017-09-09: qty 1000

## 2017-09-09 MED ORDER — IRON SUCROSE 20 MG/ML IV SOLN
200.0000 mg | Freq: Once | INTRAVENOUS | Status: AC
  Administered 2017-09-09: 200 mg via INTRAVENOUS
  Filled 2017-09-09: qty 10

## 2017-09-16 ENCOUNTER — Inpatient Hospital Stay: Payer: Medicare Other

## 2017-09-16 VITALS — BP 132/63 | HR 58 | Temp 97.9°F | Resp 20

## 2017-09-16 DIAGNOSIS — D508 Other iron deficiency anemias: Secondary | ICD-10-CM | POA: Diagnosis not present

## 2017-09-16 DIAGNOSIS — C160 Malignant neoplasm of cardia: Secondary | ICD-10-CM

## 2017-09-16 MED ORDER — SODIUM CHLORIDE 0.9 % IV SOLN
Freq: Once | INTRAVENOUS | Status: AC
  Administered 2017-09-16: 14:00:00 via INTRAVENOUS
  Filled 2017-09-16: qty 1000

## 2017-09-16 MED ORDER — IRON SUCROSE 20 MG/ML IV SOLN
200.0000 mg | Freq: Once | INTRAVENOUS | Status: AC
  Administered 2017-09-16: 200 mg via INTRAVENOUS
  Filled 2017-09-16: qty 10

## 2017-09-19 ENCOUNTER — Encounter
Admission: RE | Disposition: A | Discharge status: Home / Self Care | Payer: Self-pay | Source: Ambulatory Visit | Attending: Internal Medicine

## 2017-09-19 ENCOUNTER — Ambulatory Visit
Admission: RE | Admit: 2017-09-19 | Discharge: 2017-09-19 | Disposition: A | Discharge status: Home / Self Care | Payer: Medicare Other | Source: Ambulatory Visit | Attending: Internal Medicine | Admitting: Internal Medicine

## 2017-09-19 DIAGNOSIS — I25118 Atherosclerotic heart disease of native coronary artery with other forms of angina pectoris: Secondary | ICD-10-CM | POA: Diagnosis not present

## 2017-09-19 DIAGNOSIS — Z85028 Personal history of other malignant neoplasm of stomach: Secondary | ICD-10-CM | POA: Diagnosis not present

## 2017-09-19 DIAGNOSIS — I350 Nonrheumatic aortic (valve) stenosis: Secondary | ICD-10-CM | POA: Diagnosis not present

## 2017-09-19 DIAGNOSIS — Z882 Allergy status to sulfonamides status: Secondary | ICD-10-CM | POA: Diagnosis not present

## 2017-09-19 DIAGNOSIS — Z85118 Personal history of other malignant neoplasm of bronchus and lung: Secondary | ICD-10-CM | POA: Insufficient documentation

## 2017-09-19 DIAGNOSIS — Z7982 Long term (current) use of aspirin: Secondary | ICD-10-CM | POA: Diagnosis not present

## 2017-09-19 DIAGNOSIS — E538 Deficiency of other specified B group vitamins: Secondary | ICD-10-CM | POA: Insufficient documentation

## 2017-09-19 DIAGNOSIS — I272 Pulmonary hypertension, unspecified: Secondary | ICD-10-CM | POA: Diagnosis not present

## 2017-09-19 DIAGNOSIS — I10 Essential (primary) hypertension: Secondary | ICD-10-CM | POA: Diagnosis not present

## 2017-09-19 DIAGNOSIS — E1151 Type 2 diabetes mellitus with diabetic peripheral angiopathy without gangrene: Secondary | ICD-10-CM | POA: Diagnosis not present

## 2017-09-19 DIAGNOSIS — D696 Thrombocytopenia, unspecified: Secondary | ICD-10-CM | POA: Diagnosis not present

## 2017-09-19 DIAGNOSIS — Z888 Allergy status to other drugs, medicaments and biological substances status: Secondary | ICD-10-CM | POA: Insufficient documentation

## 2017-09-19 DIAGNOSIS — Z79891 Long term (current) use of opiate analgesic: Secondary | ICD-10-CM | POA: Insufficient documentation

## 2017-09-19 HISTORY — PX: RIGHT/LEFT HEART CATH AND CORONARY ANGIOGRAPHY: CATH118266

## 2017-09-19 LAB — GLUCOSE, CAPILLARY: Glucose-Capillary: 99 mg/dL (ref 65–99)

## 2017-09-19 LAB — CARDIAC CATHETERIZATION: Cath EF Quantitative: 80 %

## 2017-09-19 SURGERY — RIGHT/LEFT HEART CATH AND CORONARY ANGIOGRAPHY
Anesthesia: Moderate Sedation | Laterality: Bilateral

## 2017-09-19 MED ORDER — ASPIRIN 81 MG PO CHEW
81.0000 mg | CHEWABLE_TABLET | ORAL | Status: AC
  Administered 2017-09-19: 81 mg via ORAL

## 2017-09-19 MED ORDER — MIDAZOLAM HCL 2 MG/2ML IJ SOLN
INTRAMUSCULAR | Status: DC | PRN
  Administered 2017-09-19: 1 mg via INTRAVENOUS

## 2017-09-19 MED ORDER — IOPAMIDOL (ISOVUE-300) INJECTION 61%
INTRAVENOUS | Status: DC | PRN
  Administered 2017-09-19: 105 mL via INTRA_ARTERIAL

## 2017-09-19 MED ORDER — SODIUM CHLORIDE 0.9 % WEIGHT BASED INFUSION
3.0000 mL/kg/h | INTRAVENOUS | Status: AC
  Administered 2017-09-19: 3 mL/kg/h via INTRAVENOUS

## 2017-09-19 MED ORDER — LIDOCAINE HCL (PF) 1 % IJ SOLN
INTRAMUSCULAR | Status: AC
  Filled 2017-09-19: qty 30

## 2017-09-19 MED ORDER — HEPARIN (PORCINE) IN NACL 1000-0.9 UT/500ML-% IV SOLN
INTRAVENOUS | Status: AC
  Filled 2017-09-19: qty 1000

## 2017-09-19 MED ORDER — ONDANSETRON HCL 4 MG/2ML IJ SOLN: 4.0000 mg | Freq: Four times a day (QID) | INTRAMUSCULAR | Status: DC | PRN

## 2017-09-19 MED ORDER — SODIUM CHLORIDE 0.9% FLUSH: 3.0000 mL | Freq: Two times a day (BID) | INTRAVENOUS | Status: DC

## 2017-09-19 MED ORDER — SODIUM CHLORIDE 0.9 % IV SOLN: 250.0000 mL | INTRAVENOUS | Status: DC | PRN

## 2017-09-19 MED ORDER — ACETAMINOPHEN 325 MG PO TABS: 650.0000 mg | ORAL_TABLET | ORAL | Status: DC | PRN

## 2017-09-19 MED ORDER — SODIUM CHLORIDE 0.9 % WEIGHT BASED INFUSION: 1.0000 mL/kg/h | INTRAVENOUS | Status: DC

## 2017-09-19 MED ORDER — ASPIRIN 81 MG PO CHEW: 81.0000 mg | CHEWABLE_TABLET | Freq: Every day | ORAL | Status: DC

## 2017-09-19 MED ORDER — SODIUM CHLORIDE 0.9% FLUSH: 3.0000 mL | INTRAVENOUS | Status: DC | PRN

## 2017-09-19 MED ORDER — MIDAZOLAM HCL 2 MG/2ML IJ SOLN
INTRAMUSCULAR | Status: AC
  Filled 2017-09-19: qty 2

## 2017-09-19 MED ORDER — FENTANYL CITRATE (PF) 100 MCG/2ML IJ SOLN
INTRAMUSCULAR | Status: AC
  Filled 2017-09-19: qty 2

## 2017-09-19 MED ORDER — FENTANYL CITRATE (PF) 100 MCG/2ML IJ SOLN
INTRAMUSCULAR | Status: DC | PRN
  Administered 2017-09-19: 25 ug via INTRAVENOUS

## 2017-09-19 MED ORDER — ASPIRIN 81 MG PO CHEW
CHEWABLE_TABLET | ORAL | Status: AC
  Filled 2017-09-19: qty 1

## 2017-09-19 SURGICAL SUPPLY — 14 items
CATH INFINITI 5FR ANG PIGTAIL (CATHETERS) ×3 IMPLANT
CATH INFINITI 5FR JL4 (CATHETERS) ×3 IMPLANT
CATH INFINITI JR4 5F (CATHETERS) ×3 IMPLANT
CATH SWANZ 7F THERMO (CATHETERS) ×3 IMPLANT
DEVICE CLOSURE MYNXGRIP 5F (Vascular Products) ×3 IMPLANT
GUIDEWIRE EMER 3M J .025X150CM (WIRE) ×3 IMPLANT
KIT MANI 3VAL PERCEP (MISCELLANEOUS) ×3 IMPLANT
KIT RIGHT HEART (MISCELLANEOUS) ×3 IMPLANT
NEEDLE PERC 18GX7CM (NEEDLE) ×3 IMPLANT
PACK CARDIAC CATH (CUSTOM PROCEDURE TRAY) ×3 IMPLANT
SHEATH AVANTI 5FR X 11CM (SHEATH) IMPLANT
SHEATH AVANTI 6FR X 11CM (SHEATH) ×3 IMPLANT
SHEATH AVANTI 7FRX11 (SHEATH) ×3 IMPLANT
WIRE GUIDERIGHT .035X150 (WIRE) ×3 IMPLANT

## 2017-09-23 ENCOUNTER — Inpatient Hospital Stay: Payer: Medicare Other

## 2017-09-23 ENCOUNTER — Encounter: Payer: Self-pay | Admitting: Physician Assistant

## 2017-09-23 VITALS — BP 147/62 | HR 60 | Temp 98.0°F | Resp 18

## 2017-09-23 DIAGNOSIS — D508 Other iron deficiency anemias: Secondary | ICD-10-CM

## 2017-09-23 DIAGNOSIS — C160 Malignant neoplasm of cardia: Secondary | ICD-10-CM

## 2017-09-23 MED ORDER — SODIUM CHLORIDE 0.9 % IV SOLN
Freq: Once | INTRAVENOUS | Status: AC
  Administered 2017-09-23: 14:00:00 via INTRAVENOUS
  Filled 2017-09-23: qty 1000

## 2017-09-23 MED ORDER — IRON SUCROSE 20 MG/ML IV SOLN
200.0000 mg | Freq: Once | INTRAVENOUS | Status: AC
Start: 2017-09-23 — End: 2017-09-23
  Administered 2017-09-23: 200 mg via INTRAVENOUS
  Filled 2017-09-23: qty 10

## 2017-09-30 ENCOUNTER — Inpatient Hospital Stay: Payer: Medicare Other

## 2017-09-30 VITALS — BP 126/54 | HR 66 | Resp 20

## 2017-09-30 DIAGNOSIS — D508 Other iron deficiency anemias: Secondary | ICD-10-CM | POA: Diagnosis not present

## 2017-09-30 DIAGNOSIS — C160 Malignant neoplasm of cardia: Secondary | ICD-10-CM

## 2017-09-30 MED ORDER — SODIUM CHLORIDE 0.9 % IV SOLN
Freq: Once | INTRAVENOUS | Status: AC
  Administered 2017-09-30: 14:00:00 via INTRAVENOUS
  Filled 2017-09-30: qty 1000

## 2017-09-30 MED ORDER — IRON SUCROSE 20 MG/ML IV SOLN
200.0000 mg | Freq: Once | INTRAVENOUS | Status: AC
  Administered 2017-09-30: 200 mg via INTRAVENOUS
  Filled 2017-09-30: qty 10

## 2017-10-03 NOTE — Progress Notes (Signed)
Valve Clinic Consult Note  Chief Complaint  Patient presents with  . New Patient (Initial Visit)    Severe aortic stenosis and CAD   History of Present Illness: 82 yo female with history of CAD, anemia, gastric cancer, lung cancer, carotid artery disease, diabetes mellitus, hyperlipidemia, HTN and severe aortic stenosis here today as a new consult for further discussion regarding her aortic valve stenosis. She is referred by Dr. Lujean Amel. She is known to have severe aortic stenosis. Most recent echocardiogram August 2018 with normal LV systolic function, RUEA=54%, moderate LVH. Severe aortic stenosis with mean gradient 43 mmHg, peak gradient 86 mmHg. Mild AI, mild MR. Cardiac cath June 2019 at [facility] per Dr. Clayborn Bigness with severe three vessel CAD. She also has a history of anemia, GERD, bilateral carotid artery disease, diabetes mellitus and arthritis. She has had gastric cancer and is s/p partial gastrectomy in 2018. She has had lung cancer is s/p right lower lobectomy in 2017. She has hyperlipidemia and is statin intolerant.   She tells me today that she has dyspnea with minimal exertion. She is not active. She has had several episodes of chest pressure over the past month. This is not pain. She has occasional dizziness. She had an episode of near syncope in early June. She had chest pressure and felt dizzy then nauseous. She sat down and waited it out. She saw primary care and was then sent back to Dr. Clayborn Bigness. Heart cath was planned. No lower extremity edema. She lives alone but has family nearby. She is a widow. She is retired Immunologist. She sees the dentist regularly and has no active dental issues. She has never smoked.   Primary Care Physician: Adin Hector, MD Primary Cardiologist: Lujean Amel, MD Referring Cardiologist: Lujean Amel, MD  Past Medical History:  Diagnosis Date  . Absolute anemia 03/24/2015  . Acid reflux 03/24/2015  .  Acquired iron deficiency anemia due to decreased absorption 09/09/2017  . Adenocarcinoma of gastric cardia ([place]) 04/10/2016  . Anemia   . Arteriosclerosis of coronary artery 03/24/2015  . B12 deficiency 03/24/2015  . Cancer of right lung ([place]) 05/09/2015   Dr. Genevive Bi performed Right lower lobe lobectomy.   . 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 Carotid stenosis 02/07/2016  . Decreased leukocytes 03/24/2015  . Degeneration of intervertebral disc of lumbar region 03/11/2014  . Diabetes mellitus without complication ([place])   . GERD (gastroesophageal reflux disease)    also, history of ulcers  . HLD (hyperlipidemia) 08/24/2013  . Hypertension   . Malignant tumor of stomach ([place]) 07/2016   Adenocarcinoma, diffuse, poorly differentiated, signet ring, stage I  . Nerve root inflammation 03/11/2014  . Neuritis or radiculitis due to rupture of lumbar intervertebral disc 09/10/2014  . Neuroendocrine tumor 03/24/2015  . Osteoporosis   . Primary malignant neoplasm of right lower lobe of lung ([place]) 12/02/2015  . Severe aortic stenosis   . Thrombocytopenia ([place])   . Type 2 diabetes mellitus ([place]) 03/24/2015    Past Surgical History:  Procedure Laterality Date  . ABDOMINAL HYSTERECTOMY    . APPENDECTOMY    . CATARACT EXTRACTION W/PHACO Left 04/04/2017   Procedure: CATARACT EXTRACTION PHACO AND INTRAOCULAR LENS PLACEMENT (IOC);  Surgeon: Leandrew Koyanagi, MD;  Location: [facility] ORS;  Service: Ophthalmology;  Laterality: Left;  Lot # [id number] H [place] 1:00 Ap 25% CDE 8.54  . CATARACT EXTRACTION W/PHACO Right 05/15/2017   Procedure: CATARACT EXTRACTION PHACO AND INTRAOCULAR LENS  PLACEMENT (IOC);  Surgeon: Leandrew Koyanagi, MD;  Location: [facility] ORS;  Service: Ophthalmology;  Laterality: Right;  [place] 01:10 AP% 18.3 CDE 12.91 Fluid pack lot # [id number]  . ESOPHAGOGASTRODUODENOSCOPY (EGD) WITH PROPOFOL N/A 03/27/2016   Procedure: ESOPHAGOGASTRODUODENOSCOPY (EGD) WITH PROPOFOL;  Surgeon: Lollie Sails, MD;  Location: [facility] ENDOSCOPY;  Service: Endoscopy;  Laterality: N/A;  . ESOPHAGOGASTRODUODENOSCOPY (EGD) WITH PROPOFOL N/A 05/28/2016   Procedure: ESOPHAGOGASTRODUODENOSCOPY (EGD) WITH PROPOFOL;  Surgeon: Lollie Sails, MD;  Location: [facility] ENDOSCOPY;  Service: Endoscopy;  Laterality: N/A;  . ESOPHAGOGASTRODUODENOSCOPY (EGD) WITH PROPOFOL N/A 09/25/2016   Procedure: ESOPHAGOGASTRODUODENOSCOPY (EGD) WITH PROPOFOL;  Surgeon: Christene Lye, MD;  Location: [facility] ENDOSCOPY;  Service: Endoscopy;  Laterality: N/A;  . ESOPHAGOGASTRODUODENOSCOPY (EGD) WITH PROPOFOL N/A 12/18/2016   Procedure: ESOPHAGOGASTRODUODENOSCOPY (EGD) WITH PROPOFOL;  Surgeon: Christene Lye, MD;  Location: [facility] ENDOSCOPY;  Service: Endoscopy;  Laterality: N/A;  . ESOPHAGOGASTRODUODENOSCOPY (EGD) WITH PROPOFOL N/A 01/23/2017   Procedure: ESOPHAGOGASTRODUODENOSCOPY (EGD) WITH PROPOFOL;  Surgeon: Christene Lye, MD;  Location: [facility] ENDOSCOPY;  Service: Endoscopy;  Laterality: N/A;  . ESOPHAGOGASTRODUODENOSCOPY (EGD) WITH PROPOFOL N/A 03/20/2017   Procedure: ESOPHAGOGASTRODUODENOSCOPY (EGD) WITH PROPOFOL;  Surgeon: Christene Lye, MD;  Location: [facility] ENDOSCOPY;  Service: Endoscopy;  Laterality: N/A;  . FRACTURE SURGERY     Wrist  . PARTIAL GASTRECTOMY N/A 08/03/2016   Hemigastrectomy, Billroth I reconstruction Surgeon: Christene Lye, MD;  Location: [facility] ORS;  Service: General;  Laterality: N/A;  . RIGHT/LEFT HEART CATH AND CORONARY ANGIOGRAPHY Bilateral 09/19/2017   Procedure: RIGHT/LEFT HEART CATH AND CORONARY ANGIOGRAPHY;  Surgeon: Yolonda Kida, MD;  Location: [facility];  Service: Cardiovascular;  Laterality: Bilateral;  . SHOULDER ARTHROSCOPY W/ CAPSULAR REPAIR Right   . THORACOTOMY Right 05/09/2015   Procedure: THORACOTOMY, RIGHT LOWER LOBECTOMY, BRONCHOSCOPY;  Surgeon: Nestor Lewandowsky, MD;  Location: [facility] ORS;  Service: Thoracic;  Laterality: Right;  . UPPER GI  ENDOSCOPY N/A 08/03/2016   Procedure: UPPER  ENDOSCOPY;  Surgeon: Christene Lye, MD;  Location: [facility] ORS;  Service: General;  Laterality: N/A;    Current Outpatient Medications  Medication Sig Dispense Refill  . aspirin EC 81 MG tablet Take 81 mg by mouth daily.    . cholecalciferol (VITAMIN D) 1000 UNITS tablet Take 1,000 Units by mouth daily.    . cyanocobalamin 1000 MCG tablet Take 1,000 mcg by mouth daily.     . ferrous sulfate 325 (65 FE) MG tablet Take 325 mg by mouth daily with breakfast.    . HYDROcodone-acetaminophen (NORCO) 7.5-325 MG tablet Take 1 tablet by mouth 4 (four) times daily as needed for moderate pain.     . isosorbide mononitrate (IMDUR) 30 MG 24 hr tablet Take 30 mg by mouth daily.  5  . losartan (COZAAR) 100 MG tablet Take 100 mg by mouth daily.     . nitroGLYCERIN (NITROSTAT) 0.4 MG SL tablet Place 0.4 mg under the tongue every 5 (five) minutes as needed for chest pain.    . RABEprazole (ACIPHEX) 20 MG tablet Take 20 mg by mouth daily.    . tizanidine (ZANAFLEX) 2 MG capsule Take 2-4 mg by mouth 2 (two) times daily as needed for muscle spasms.     . metoprolol succinate (TOPROL-XL) 25 MG 24 hr tablet Take 25 mg by mouth at bedtime.      No current facility-administered medications for this visit.     Allergies  Allergen Reactions  . Pneumococcal Vaccine Itching and Other (See Comments)  Hives and fever  . Statins Other (See Comments)    Joint pain  . Sulfa Antibiotics Nausea And Vomiting    Social History   Socioeconomic History  . Marital status: Widowed    Spouse name: Not on file  . Number of children: 2  . Years of education: Not on file  . Highest education level: Not on file  Occupational History  . Occupation: Arboriculturist   Social Needs  . Financial resource strain: Not on file  . Food insecurity:    Worry: Not on file    Inability: Not on file  . Transportation needs:    Medical: Not on file    Non-medical: Not on  file  Tobacco Use  . Smoking status: Never Smoker  . Smokeless tobacco: Never Used  Substance and Sexual Activity  . Alcohol use: No  . Drug use: No  . Sexual activity: Not on file  Lifestyle  . Physical activity:    Days per week: Not on file    Minutes per session: Not on file  . Stress: Not on file  Relationships  . Social connections:    Talks on phone: Not on file    Gets together: Not on file    Attends religious service: Not on file    Active member of club or organization: Not on file    Attends meetings of clubs or organizations: Not on file    Relationship status: Not on file  . Intimate partner violence:    Fear of current or ex partner: Not on file    Emotionally abused: Not on file    Physically abused: Not on file    Forced sexual activity: Not on file  Other Topics Concern  . Not on file  Social History Narrative  . Not on file    Family History  Problem Relation Age of Onset  . Diabetes Other   . Aortic aneurysm Mother   . Breast cancer Neg Hx     Review of Systems:  As stated in the HPI and otherwise negative.   BP (!) 175/69   Pulse (!) 59   Ht 5\' 1"  (1.549 m)   Wt 131 lb 12.8 oz (59.8 kg)   SpO2 97%   BMI 24.90 kg/m   Physical Examination: General: Well developed, well nourished, NAD  HEENT: OP clear, mucus membranes moist  SKIN: warm, dry. No rashes. Neuro: No focal deficits  Musculoskeletal: Muscle strength 5/5 all ext  Psychiatric: Mood and affect normal  Neck: No JVD, no carotid bruits, no thyromegaly, no lymphadenopathy.  Lungs:Clear bilaterally, no wheezes, rhonci, crackles Cardiovascular: Regular rate and rhythm. Loud, harsh and late peaking systolic murmur.  Abdomen:Soft. Bowel sounds present. Non-tender.  Extremities: No lower extremity edema. Pulses are 2 + in the bilateral DP/PT.  Cardiac cath [facility] 09/19/17:  Dist RCA lesion is 75% stenosed.  Mid RCA lesion is 50% stenosed.  Prox LAD lesion is 75% stenosed.  Ost LAD  lesion is 50% stenosed.  Prox Cx lesion is 90% stenosed.  Ost Cx lesion is 50% stenosed.  There is hyperdynamic left ventricular systolic function.  LV end diastolic pressure is normal.  There is severe aortic valve stenosis. There is trivial (1+) aortic regurgitation.  Hemodynamic findings consistent with moderate pulmonary hypertension.   Conclusion Right heart with moderate pulmonary hypertension PA mean of 36 Left ventriculogram hyperdynamic LV ejection fraction greater than 80% Left heart cath with multivessel coronary disease 75 mid LAD at the  bifurcation Mid circumflex of 90% Mid to distal RCA of 80% Aortic valve with critical left ear of 0.5cm Defer intervention Consider referral for coronary bypass surgery and aortic valve replacement Would consider TAVR and multivessel PCI extent  Coronary Diagrams   Diagnostic Diagram        Echo August 2018  AORTIC ROOT  Size:Normal  Dissection:INDETERM FOR DISSECTION   AORTIC VALVE  Leaflets:Tricuspid Morphology:SEVERELY THICKENED  Mobility:PARTIALLY MOBILE   LEFT VENTRICLE  Size:SMALL Anterior:Normal  Contraction:Normal Lateral:Normal  Closest EF:>55% (Estimated)Septal:Normal   LV Masses:No Masses Apical:Normal   GXQ:JJHERDEY LVHInferior:Normal  Posterior:Normal  Dias.FxClass:N/A   MITRAL VALVE  Leaflets:NormalMobility:Fully mobile  Morphology:THICKENED LEAFLET(S)   LEFT ATRIUM  Size:MILDLY ENLARGEDLA Masses:No masses   IA Septum:Normal IAS   MAIN PA  Size:Normal   PULMONIC VALVE  Morphology:NormalMobility:Fully mobile   RIGHT VENTRICLE   RV Masses:No Masses Size:Normal   Free Wall:Normal Contraction:Normal   TRICUSPID VALVE    Leaflets:NormalMobility:Fully mobile  Morphology:Normal   RIGHT ATRIUM  Size:NormalRA Other:None   RA Mass:No masses   PERICARDIUM   Fluid:No effusion   INFERIOR VENACAVA  Size:Not seen Not Seen   _____________________________________________________________________   DOPPLER ECHO and OTHER SPECIAL PROCEDURES   Aortic:MILD ARSEVERE AS  463.7 cm/sec peak vel86.0 mmHg peak grad  43.0 mmHg mean grad    Mitral:MILD MRNo MS   2.5 cm^2 by DOPPLER  MV Inflow E Vel=126.0 cm/sec MV Annulus E'Vel=4.2 cm/sec  E/E'Ratio=30.0   Tricuspid:MILD TRNo TS  217.2 cm/sec peak TR vel 23.9 mmHg peak RV pressure   Pulmonary:MILD PRNo PS  135.7 cm/sec peak vel7.4 mmHg peak grad     ___________________________________________________________________________________________   INTERPRETATION  NORMAL LEFT VENTRICULAR SYSTOLIC FUNCTION WITH MODERATE LVH  NORMAL RIGHT VENTRICULAR SYSTOLIC FUNCTION  MILD VALVULAR REGURGITATION (See above)  SEVERE VALVULAR STENOSIS (See above)  MILD AR, MR, TR and PR  EF >55%   EKG:  EKG is ordered today. The ekg ordered today demonstrates Sinus brady, rate 59 bpm. Poor R wave progression precordial leads. T wave inversions inferior leads, lateral leads and anterolateral leads.   Recent Labs: 08/29/2017: BUN 41; Creatinine, Ser 1.54; Hemoglobin 10.7; Platelets 134; Potassium 3.8; Sodium 138   Lipid Panel No results found for: CHOL, TRIG, HDL, CHOLHDL, VLDL, LDLCALC, LDLDIRECT   Wt Readings from Last 3 Encounters:  10/04/17 131 lb 12.8 oz (59.8 kg)  09/19/17 129 lb (58.5 kg)  08/29/17 129 lb 3.2 oz (58.6 kg)     Other studies Reviewed: Additional studies/ records  that were reviewed today include: . Review of the above records demonstrates:    Assessment and Plan:   1. Severe aortic valve stenosis: She has severe, stage D aortic valve stenosis. I have not reviewed the echo images that were done in [facility] in 2018.  Her valve is described as being thickened.  I will arrange an echo now. I think she would benefit from AVR. Given advanced age, she is not a good candidate for conventional AVR by surgical approach. I think she may be a good candidate for TAVR. She will need PCI of the LAD/RCA and Circumflex prior to TAVR.   STS Score:  Risk of Mortality: 9.09% Renal Failure: 8.823% Permanent Stroke: 3.480% Prolonged Ventilation: 19.169% DSW Infection: 0.143% Reoperation:6.773% Morbidity or Mortality: 29.384% Short Length of Stay: 13.132% Long Length of Stay: 16.496%    I have reviewed the natural history of aortic stenosis with the patient and her son who is present today. We have discussed the limitations of  medical therapy and the poor prognosis associated with symptomatic aortic stenosis. We have reviewed potential treatment options, including palliative medical therapy, conventional surgical aortic valve replacement, and transcatheter aortic valve replacement. We discussed treatment options in the context of the patient's specific comorbid medical conditions.   She would like to proceed with planning for TAVR. Risks and benefits of the TAVR procedure and the PCI procedure are reviewed with the patient. I will arrange PCI at [facility] on 10/15/17 at noon with pre-cath hydration. Pre-cath labs next week. She will have a cardiac CT, CTA of the chest/abdomen and pelvis, PFTs, carotid dopplers following the PCI and echo and will then be referred to see one of the CT surgeons on our TAVR team.    2. CAD with unstable angina: Will arrange PCI of the LAD, Circumflex and RCA at [facility] on 10/15/17.  I have reviewed the risks, indications, and alternatives to  cardiac catheterization, possible angioplasty, and stenting with the patient. Risks include but are not limited to bleeding, infection, vascular injury, stroke, myocardial infection, arrhythmia, kidney injury, radiation-related injury in the case of prolonged fluoroscopy use, emergency cardiac surgery, and death. The patient understands the risks of serious complication is 1-2 in [id number] with diagnostic cardiac cath and 1-2% or less with angioplasty/stenting. I will load Plavix next week after her case is presented at the valve meeting. Continue ASA. She is statin intolerant. Consider Praluent or Repatha in the future.   Current medicines are reviewed at length with the patient today.  The patient does not have concerns regarding medicines.  The following changes have been made:  no change  Labs/ tests ordered today include:   Orders Placed This Encounter  Procedures  . Basic Metabolic Panel (BMET)  . CBC  . EKG 12-Lead  . ECHOCARDIOGRAM COMPLETE     Disposition:   FU will be with the valve team following her PCI.    Signed, Lauree Chandler, MD 10/04/2017 9:52 AM    [facility] [address], [place], [place]  [address] Phone: [phone]; Fax: [phone]

## 2017-10-03 NOTE — H&P (View-Only) (Signed)
Valve Clinic Consult Note  Chief Complaint  Patient presents with  . New Patient (Initial Visit)    Severe aortic stenosis and CAD   History of Present Illness: 82 yo female with history of CAD, anemia, gastric cancer, lung cancer, carotid artery disease, diabetes mellitus, hyperlipidemia, HTN and severe aortic stenosis here today as a new consult for further discussion regarding her aortic valve stenosis. She is referred by Dr. Lujean Amel. She is known to have severe aortic stenosis. Most recent echocardiogram August 2018 with normal LV systolic function, EXBM=84%, moderate LVH. Severe aortic stenosis with mean gradient 43 mmHg, peak gradient 86 mmHg. Mild AI, mild MR. Cardiac cath June 2019 at [facility] per Dr. Clayborn Bigness with severe three vessel CAD. She also has a history of anemia, GERD, bilateral carotid artery disease, diabetes mellitus and arthritis. She has had gastric cancer and is s/p partial gastrectomy in 2018. She has had lung cancer is s/p right lower lobectomy in 2017. She has hyperlipidemia and is statin intolerant.   She tells me today that she has dyspnea with minimal exertion. She is not active. She has had several episodes of chest pressure over the past month. This is not pain. She has occasional dizziness. She had an episode of near syncope in early June. She had chest pressure and felt dizzy then nauseous. She sat down and waited it out. She saw primary care and was then sent back to Dr. Clayborn Bigness. Heart cath was planned. No lower extremity edema. She lives alone but has family nearby. She is a widow. She is retired Immunologist. She sees the dentist regularly and has no active dental issues. She has never smoked.   Primary Care Physician: Adin Hector, MD Primary Cardiologist: Lujean Amel, MD Referring Cardiologist: Lujean Amel, MD  Past Medical History:  Diagnosis Date  . Absolute anemia 03/24/2015  . Acid reflux 03/24/2015  .  Acquired iron deficiency anemia due to decreased absorption 09/09/2017  . Adenocarcinoma of gastric cardia ([place]) 04/10/2016  . Anemia   . Arteriosclerosis of coronary artery 03/24/2015  . B12 deficiency 03/24/2015  . Cancer of right lung ([place]) 05/09/2015   Dr. Genevive Bi performed Right lower lobe lobectomy.   . 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 Carotid stenosis 02/07/2016  . Decreased leukocytes 03/24/2015  . Degeneration of intervertebral disc of lumbar region 03/11/2014  . Diabetes mellitus without complication ([place])   . GERD (gastroesophageal reflux disease)    also, history of ulcers  . HLD (hyperlipidemia) 08/24/2013  . Hypertension   . Malignant tumor of stomach ([place]) 07/2016   Adenocarcinoma, diffuse, poorly differentiated, signet ring, stage I  . Nerve root inflammation 03/11/2014  . Neuritis or radiculitis due to rupture of lumbar intervertebral disc 09/10/2014  . Neuroendocrine tumor 03/24/2015  . Osteoporosis   . Primary malignant neoplasm of right lower lobe of lung ([place]) 12/02/2015  . Severe aortic stenosis   . Thrombocytopenia ([place])   . Type 2 diabetes mellitus ([place]) 03/24/2015    Past Surgical History:  Procedure Laterality Date  . ABDOMINAL HYSTERECTOMY    . APPENDECTOMY    . CATARACT EXTRACTION W/PHACO Left 04/04/2017   Procedure: CATARACT EXTRACTION PHACO AND INTRAOCULAR LENS PLACEMENT (IOC);  Surgeon: Leandrew Koyanagi, MD;  Location: [facility] ORS;  Service: Ophthalmology;  Laterality: Left;  Lot # [id number] H [place] 1:00 Ap 25% CDE 8.54  . CATARACT EXTRACTION W/PHACO Right 05/15/2017   Procedure: CATARACT EXTRACTION PHACO AND INTRAOCULAR LENS  PLACEMENT (IOC);  Surgeon: Leandrew Koyanagi, MD;  Location: [facility] ORS;  Service: Ophthalmology;  Laterality: Right;  [place] 01:10 AP% 18.3 CDE 12.91 Fluid pack lot # [id number]  . ESOPHAGOGASTRODUODENOSCOPY (EGD) WITH PROPOFOL N/A 03/27/2016   Procedure: ESOPHAGOGASTRODUODENOSCOPY (EGD) WITH PROPOFOL;  Surgeon: Lollie Sails, MD;  Location: [facility] ENDOSCOPY;  Service: Endoscopy;  Laterality: N/A;  . ESOPHAGOGASTRODUODENOSCOPY (EGD) WITH PROPOFOL N/A 05/28/2016   Procedure: ESOPHAGOGASTRODUODENOSCOPY (EGD) WITH PROPOFOL;  Surgeon: Lollie Sails, MD;  Location: [facility] ENDOSCOPY;  Service: Endoscopy;  Laterality: N/A;  . ESOPHAGOGASTRODUODENOSCOPY (EGD) WITH PROPOFOL N/A 09/25/2016   Procedure: ESOPHAGOGASTRODUODENOSCOPY (EGD) WITH PROPOFOL;  Surgeon: Christene Lye, MD;  Location: [facility] ENDOSCOPY;  Service: Endoscopy;  Laterality: N/A;  . ESOPHAGOGASTRODUODENOSCOPY (EGD) WITH PROPOFOL N/A 12/18/2016   Procedure: ESOPHAGOGASTRODUODENOSCOPY (EGD) WITH PROPOFOL;  Surgeon: Christene Lye, MD;  Location: [facility] ENDOSCOPY;  Service: Endoscopy;  Laterality: N/A;  . ESOPHAGOGASTRODUODENOSCOPY (EGD) WITH PROPOFOL N/A 01/23/2017   Procedure: ESOPHAGOGASTRODUODENOSCOPY (EGD) WITH PROPOFOL;  Surgeon: Christene Lye, MD;  Location: [facility] ENDOSCOPY;  Service: Endoscopy;  Laterality: N/A;  . ESOPHAGOGASTRODUODENOSCOPY (EGD) WITH PROPOFOL N/A 03/20/2017   Procedure: ESOPHAGOGASTRODUODENOSCOPY (EGD) WITH PROPOFOL;  Surgeon: Christene Lye, MD;  Location: [facility] ENDOSCOPY;  Service: Endoscopy;  Laterality: N/A;  . FRACTURE SURGERY     Wrist  . PARTIAL GASTRECTOMY N/A 08/03/2016   Hemigastrectomy, Billroth I reconstruction Surgeon: Christene Lye, MD;  Location: [facility] ORS;  Service: General;  Laterality: N/A;  . RIGHT/LEFT HEART CATH AND CORONARY ANGIOGRAPHY Bilateral 09/19/2017   Procedure: RIGHT/LEFT HEART CATH AND CORONARY ANGIOGRAPHY;  Surgeon: Yolonda Kida, MD;  Location: [facility] CV LAB;  Service: Cardiovascular;  Laterality: Bilateral;  . SHOULDER ARTHROSCOPY W/ CAPSULAR REPAIR Right   . THORACOTOMY Right 05/09/2015   Procedure: THORACOTOMY, RIGHT LOWER LOBECTOMY, BRONCHOSCOPY;  Surgeon: Nestor Lewandowsky, MD;  Location: [facility] ORS;  Service: Thoracic;  Laterality: Right;  . UPPER GI  ENDOSCOPY N/A 08/03/2016   Procedure: UPPER  ENDOSCOPY;  Surgeon: Christene Lye, MD;  Location: [facility] ORS;  Service: General;  Laterality: N/A;    Current Outpatient Medications  Medication Sig Dispense Refill  . aspirin EC 81 MG tablet Take 81 mg by mouth daily.    . cholecalciferol (VITAMIN D) 1000 UNITS tablet Take 1,000 Units by mouth daily.    . cyanocobalamin 1000 MCG tablet Take 1,000 mcg by mouth daily.     . ferrous sulfate 325 (65 FE) MG tablet Take 325 mg by mouth daily with breakfast.    . HYDROcodone-acetaminophen (NORCO) 7.5-325 MG tablet Take 1 tablet by mouth 4 (four) times daily as needed for moderate pain.     . isosorbide mononitrate (IMDUR) 30 MG 24 hr tablet Take 30 mg by mouth daily.  5  . losartan (COZAAR) 100 MG tablet Take 100 mg by mouth daily.     . nitroGLYCERIN (NITROSTAT) 0.4 MG SL tablet Place 0.4 mg under the tongue every 5 (five) minutes as needed for chest pain.    . RABEprazole (ACIPHEX) 20 MG tablet Take 20 mg by mouth daily.    . tizanidine (ZANAFLEX) 2 MG capsule Take 2-4 mg by mouth 2 (two) times daily as needed for muscle spasms.     . metoprolol succinate (TOPROL-XL) 25 MG 24 hr tablet Take 25 mg by mouth at bedtime.      No current facility-administered medications for this visit.     Allergies  Allergen Reactions  . Pneumococcal Vaccine Itching and Other (See Comments)  Hives and fever  . Statins Other (See Comments)    Joint pain  . Sulfa Antibiotics Nausea And Vomiting    Social History   Socioeconomic History  . Marital status: Widowed    Spouse name: Not on file  . Number of children: 2  . Years of education: Not on file  . Highest education level: Not on file  Occupational History  . Occupation: Arboriculturist   Social Needs  . Financial resource strain: Not on file  . Food insecurity:    Worry: Not on file    Inability: Not on file  . Transportation needs:    Medical: Not on file    Non-medical: Not on  file  Tobacco Use  . Smoking status: Never Smoker  . Smokeless tobacco: Never Used  Substance and Sexual Activity  . Alcohol use: No  . Drug use: No  . Sexual activity: Not on file  Lifestyle  . Physical activity:    Days per week: Not on file    Minutes per session: Not on file  . Stress: Not on file  Relationships  . Social connections:    Talks on phone: Not on file    Gets together: Not on file    Attends religious service: Not on file    Active member of club or organization: Not on file    Attends meetings of clubs or organizations: Not on file    Relationship status: Not on file  . Intimate partner violence:    Fear of current or ex partner: Not on file    Emotionally abused: Not on file    Physically abused: Not on file    Forced sexual activity: Not on file  Other Topics Concern  . Not on file  Social History Narrative  . Not on file    Family History  Problem Relation Age of Onset  . Diabetes Other   . Aortic aneurysm Mother   . Breast cancer Neg Hx     Review of Systems:  As stated in the HPI and otherwise negative.   BP (!) 175/69   Pulse (!) 59   Ht 5\' 1"  (1.549 m)   Wt 131 lb 12.8 oz (59.8 kg)   SpO2 97%   BMI 24.90 kg/m   Physical Examination: General: Well developed, well nourished, NAD  HEENT: OP clear, mucus membranes moist  SKIN: warm, dry. No rashes. Neuro: No focal deficits  Musculoskeletal: Muscle strength 5/5 all ext  Psychiatric: Mood and affect normal  Neck: No JVD, no carotid bruits, no thyromegaly, no lymphadenopathy.  Lungs:Clear bilaterally, no wheezes, rhonci, crackles Cardiovascular: Regular rate and rhythm. Loud, harsh and late peaking systolic murmur.  Abdomen:Soft. Bowel sounds present. Non-tender.  Extremities: No lower extremity edema. Pulses are 2 + in the bilateral DP/PT.  Cardiac cath [facility] 09/19/17:  Dist RCA lesion is 75% stenosed.  Mid RCA lesion is 50% stenosed.  Prox LAD lesion is 75% stenosed.  Ost LAD  lesion is 50% stenosed.  Prox Cx lesion is 90% stenosed.  Ost Cx lesion is 50% stenosed.  There is hyperdynamic left ventricular systolic function.  LV end diastolic pressure is normal.  There is severe aortic valve stenosis. There is trivial (1+) aortic regurgitation.  Hemodynamic findings consistent with moderate pulmonary hypertension.   Conclusion Right heart with moderate pulmonary hypertension PA mean of 36 Left ventriculogram hyperdynamic LV ejection fraction greater than 80% Left heart cath with multivessel coronary disease 75 mid LAD at the  bifurcation Mid circumflex of 90% Mid to distal RCA of 80% Aortic valve with critical left ear of 0.5cm Defer intervention Consider referral for coronary bypass surgery and aortic valve replacement Would consider TAVR and multivessel PCI extent  Coronary Diagrams   Diagnostic Diagram        Echo August 2018  AORTIC ROOT  Size:Normal  Dissection:INDETERM FOR DISSECTION   AORTIC VALVE  Leaflets:Tricuspid Morphology:SEVERELY THICKENED  Mobility:PARTIALLY MOBILE   LEFT VENTRICLE  Size:SMALL Anterior:Normal  Contraction:Normal Lateral:Normal  Closest EF:>55% (Estimated)Septal:Normal   LV Masses:No Masses Apical:Normal   JTT:SVXBLTJQ LVHInferior:Normal  Posterior:Normal  Dias.FxClass:N/A   MITRAL VALVE  Leaflets:NormalMobility:Fully mobile  Morphology:THICKENED LEAFLET(S)   LEFT ATRIUM  Size:MILDLY ENLARGEDLA Masses:No masses   IA Septum:Normal IAS   MAIN PA  Size:Normal   PULMONIC VALVE  Morphology:NormalMobility:Fully mobile   RIGHT VENTRICLE   RV Masses:No Masses Size:Normal   Free Wall:Normal Contraction:Normal   TRICUSPID VALVE    Leaflets:NormalMobility:Fully mobile  Morphology:Normal   RIGHT ATRIUM  Size:NormalRA Other:None   RA Mass:No masses   PERICARDIUM   Fluid:No effusion   INFERIOR VENACAVA  Size:Not seen Not Seen   _____________________________________________________________________   DOPPLER ECHO and OTHER SPECIAL PROCEDURES   Aortic:MILD ARSEVERE AS  463.7 cm/sec peak vel86.0 mmHg peak grad  43.0 mmHg mean grad    Mitral:MILD MRNo MS   2.5 cm^2 by DOPPLER  MV Inflow E Vel=126.0 cm/sec MV Annulus E'Vel=4.2 cm/sec  E/E'Ratio=30.0   Tricuspid:MILD TRNo TS  217.2 cm/sec peak TR vel 23.9 mmHg peak RV pressure   Pulmonary:MILD PRNo PS  135.7 cm/sec peak vel7.4 mmHg peak grad     ___________________________________________________________________________________________   INTERPRETATION  NORMAL LEFT VENTRICULAR SYSTOLIC FUNCTION WITH MODERATE LVH  NORMAL RIGHT VENTRICULAR SYSTOLIC FUNCTION  MILD VALVULAR REGURGITATION (See above)  SEVERE VALVULAR STENOSIS (See above)  MILD AR, MR, TR and PR  EF >55%   EKG:  EKG is ordered today. The ekg ordered today demonstrates Sinus brady, rate 59 bpm. Poor R wave progression precordial leads. T wave inversions inferior leads, lateral leads and anterolateral leads.   Recent Labs: 08/29/2017: BUN 41; Creatinine, Ser 1.54; Hemoglobin 10.7; Platelets 134; Potassium 3.8; Sodium 138   Lipid Panel No results found for: CHOL, TRIG, HDL, CHOLHDL, VLDL, LDLCALC, LDLDIRECT   Wt Readings from Last 3 Encounters:  10/04/17 131 lb 12.8 oz (59.8 kg)  09/19/17 129 lb (58.5 kg)  08/29/17 129 lb 3.2 oz (58.6 kg)     Other studies Reviewed: Additional studies/ records  that were reviewed today include: . Review of the above records demonstrates:    Assessment and Plan:   1. Severe aortic valve stenosis: She has severe, stage D aortic valve stenosis. I have not reviewed the echo images that were done in [facility] in 2018.  Her valve is described as being thickened.  I will arrange an echo now. I think she would benefit from AVR. Given advanced age, she is not a good candidate for conventional AVR by surgical approach. I think she may be a good candidate for TAVR. She will need PCI of the LAD/RCA and Circumflex prior to TAVR.   STS Score:  Risk of Mortality: 9.09% Renal Failure: 8.823% Permanent Stroke: 3.480% Prolonged Ventilation: 19.169% DSW Infection: 0.143% Reoperation:6.773% Morbidity or Mortality: 29.384% Short Length of Stay: 13.132% Long Length of Stay: 16.496%    I have reviewed the natural history of aortic stenosis with the patient and her son who is present today. We have discussed the limitations of  medical therapy and the poor prognosis associated with symptomatic aortic stenosis. We have reviewed potential treatment options, including palliative medical therapy, conventional surgical aortic valve replacement, and transcatheter aortic valve replacement. We discussed treatment options in the context of the patient's specific comorbid medical conditions.   She would like to proceed with planning for TAVR. Risks and benefits of the TAVR procedure and the PCI procedure are reviewed with the patient. I will arrange PCI at [facility] on 10/15/17 at noon with pre-cath hydration. Pre-cath labs next week. She will have a cardiac CT, CTA of the chest/abdomen and pelvis, PFTs, carotid dopplers following the PCI and echo and will then be referred to see one of the CT surgeons on our TAVR team.    2. CAD with unstable angina: Will arrange PCI of the LAD, Circumflex and RCA at [facility] on 10/15/17.  I have reviewed the risks, indications, and alternatives to  cardiac catheterization, possible angioplasty, and stenting with the patient. Risks include but are not limited to bleeding, infection, vascular injury, stroke, myocardial infection, arrhythmia, kidney injury, radiation-related injury in the case of prolonged fluoroscopy use, emergency cardiac surgery, and death. The patient understands the risks of serious complication is 1-2 in 2671 with diagnostic cardiac cath and 1-2% or less with angioplasty/stenting. I will load Plavix next week after her case is presented at the valve meeting. Continue ASA. She is statin intolerant. Consider Praluent or Repatha in the future.   Current medicines are reviewed at length with the patient today.  The patient does not have concerns regarding medicines.  The following changes have been made:  no change  Labs/ tests ordered today include:   Orders Placed This Encounter  Procedures  . Basic Metabolic Panel (BMET)  . CBC  . EKG 12-Lead  . ECHOCARDIOGRAM COMPLETE     Disposition:   FU will be with the valve team following her PCI.    Signed, Lauree Chandler, MD 10/04/2017 9:52 AM    [facility] [address], [place], [place]  [address] Phone: [phone]; Fax: [phone]

## 2017-10-04 ENCOUNTER — Ambulatory Visit: Payer: Medicare Other | Admitting: Cardiovascular Disease

## 2017-10-04 ENCOUNTER — Encounter: Payer: Self-pay | Admitting: Cardiovascular Disease

## 2017-10-04 VITALS — BP 175/69 | HR 59 | Ht 61.0 in | Wt 131.8 lb

## 2017-10-04 DIAGNOSIS — I25110 Atherosclerotic heart disease of native coronary artery with unstable angina pectoris: Secondary | ICD-10-CM

## 2017-10-04 DIAGNOSIS — I350 Nonrheumatic aortic (valve) stenosis: Secondary | ICD-10-CM | POA: Diagnosis not present

## 2017-10-04 NOTE — Patient Instructions (Signed)
Medication Instructions:  Your physician recommends that you continue on your current medications as directed. Please refer to the Current Medication list given to you today.   Labwork: Lab work to be done in [place] on July 5,2019  Testing/Procedures: Your physician has requested that you have an echocardiogram. Echocardiography is a painless test that uses sound waves to create images of your heart. It provides your doctor with information about the size and shape of your heart and how well your heart's chambers and valves are working. This procedure takes approximately one hour. There are no restrictions for this procedure.  To be done in [place] office on July 5,2019 at 11:00.  Please arrive at 10:30  Your physician has requested that you have a cardiac catheterization. Cardiac catheterization is used to diagnose and/or treat various heart conditions. Doctors may recommend this procedure for a number of different reasons. The most common reason is to evaluate chest pain. Chest pain can be a symptom of coronary artery disease (CAD), and cardiac catheterization can show whether plaque is narrowing or blocking your heart's arteries. This procedure is also used to evaluate the valves, as well as measure the blood flow and oxygen levels in different parts of your heart. For further information please visit [employer]uge[employer]iesta[employer]tn. Please follow instruction sheet, as given.  Scheduled for July 9,2019 at [place]: To be arranged after procedure.  Any Other Special Instructions Will Be Listed Below (If Applicable).    [facility] OFF[facility] [address], [place] [address] [place] [address] Dept: [phone] Loc: [place]  10/04/2017  You are scheduled for a Cardiac Catheterization on Tuesday, July 9 with Dr. Lauree Chandler.  1. Please arrive at the [facility] (Main  Entrance A) at [facility]: [place], [place] [address] at 7:00 AM (5 hours before your procedure to ensure your preparation). Free valet parking service is available.   Special note: Every effort is made to have your procedure done on time. Please understand that emergencies sometimes delay scheduled procedures.  2. Diet: No solid food after midnight prior to procedure. May have clear liquids until 5 AM day of procedure.   3. Labs: You will need to have blood drawn on Friday, July 5 at [facility]) [place]. [address] Open: [place]  Phone: [phone]. You do not need to be fasting.  4. Medication instructions in preparation for your procedure:  Do not take Losartan the day before and day of procedure.   On the morning of your procedure, take your Aspirin and any morning medicines NOT listed above.  You may use sips of water.  5. Plan for one night stay--bring personal belongings. 6. Bring a current list of your medications and current insurance cards. 7. You MUST have a responsible person to drive you home. 8. Someone MUST be with you the first 24 hours after you arrive home or your discharge will be delayed. 9. Please wear clothes that are easy to get on and off and wear slip-on shoes.  Thank you for allowing [place] to care for you!   --  Invasive Cardiovascular services    If you need a refill on your cardiac medications before your next appointment, please call your pharmacy.

## 2017-10-07 ENCOUNTER — Other Ambulatory Visit: Payer: Self-pay | Admitting: Physician Assistant

## 2017-10-07 ENCOUNTER — Encounter: Payer: Self-pay | Admitting: Physician Assistant

## 2017-10-07 DIAGNOSIS — I350 Nonrheumatic aortic (valve) stenosis: Secondary | ICD-10-CM

## 2017-10-08 ENCOUNTER — Telehealth: Payer: Self-pay

## 2017-10-08 MED ORDER — CLOPIDOGREL BISULFATE 75 MG PO TABS: 75.0000 mg | ORAL_TABLET | Freq: Every day | ORAL | 3 refills | Status: DC

## 2017-10-08 NOTE — Telephone Encounter (Signed)
Dr Angelena Form reviewed pt's case with Multidisciplinary team and plans to proceed with PCI on 7/9.  Order given to load pt with Plavix 600mg  times one dose (eight 75mg  tablets) and then decrease to plavix 75mg  take one tablet by mouth daily. I spoke with the pt and made her aware of instructions.  She verbalized understanding of loading dose and Rx sent to the pharmacy (I made her aware that loading dose instructions will not be on her bottle at pharmacy). The pt is scheduled for BMP and CBC on Friday in the [place] office and this will reassess her PLT count.

## 2017-10-11 ENCOUNTER — Other Ambulatory Visit: Payer: Self-pay

## 2017-10-11 ENCOUNTER — Ambulatory Visit (INDEPENDENT_AMBULATORY_CARE_PROVIDER_SITE_OTHER): Payer: Medicare Other

## 2017-10-11 ENCOUNTER — Other Ambulatory Visit: Payer: Medicare Other

## 2017-10-11 ENCOUNTER — Other Ambulatory Visit: Payer: Self-pay | Admitting: Physician Assistant

## 2017-10-11 DIAGNOSIS — I350 Nonrheumatic aortic (valve) stenosis: Secondary | ICD-10-CM

## 2017-10-12 LAB — CBC
HEMATOCRIT: 31.8 % — AB (ref 34.0–46.6)
HEMOGLOBIN: 10.9 g/dL — AB (ref 11.1–15.9)
MCH: 29.9 pg (ref 26.6–33.0)
MCHC: 34.3 g/dL (ref 31.5–35.7)
MCV: 87 fL (ref 79–97)
PLATELETS: 87 10*3/uL — AB (ref 150–450)
RBC: 3.65 x10E6/uL — ABNORMAL LOW (ref 3.77–5.28)
RDW: 14.7 % (ref 12.3–15.4)
WBC: 4.6 10*3/uL (ref 3.4–10.8)

## 2017-10-12 LAB — BASIC METABOLIC PANEL
BUN/Creatinine Ratio: 18 (ref 12–28)
BUN: 24 mg/dL (ref 8–27)
CO2: 21 mmol/L (ref 20–29)
CREATININE: 1.31 mg/dL — AB (ref 0.57–1.00)
Calcium: 9.3 mg/dL (ref 8.7–10.3)
Chloride: 111 mmol/L — ABNORMAL HIGH (ref 96–106)
GFR, EST AFRICAN AMERICAN: 45 mL/min/{1.73_m2} — AB (ref >59)
GFR, EST NON AFRICAN AMERICAN: 39 mL/min/{1.73_m2} — AB (ref >59)
GLUCOSE: 95 mg/dL (ref 65–99)
Potassium: 4.5 mmol/L (ref 3.5–5.2)
Sodium: 143 mmol/L (ref 134–144)

## 2017-10-14 ENCOUNTER — Telehealth: Payer: Self-pay | Admitting: *Deleted

## 2017-10-14 NOTE — Telephone Encounter (Signed)
Pt contacted pre-procedure scheduled at [facility] for: Tuesday October 15, 2017 12 noon Verified arrival time and place: [place] Entrance A at: 7 AM for pre-procedure hydration  No solid food after midnight prior to cath, clear liquids until 5 AM day of procedure. Verified no diabetes medications.  Hold: Losartan 10/14/17 and 10/15/17  AM meds can be  taken pre-cath with sip of water including: ASA 81 mg Clopidogrel 75 mg  Confirmed patient has responsible person to drive home post procedure and for 24 hours after you arrive home: yes  Per Dr Tresea Mall ordered to be done on arrival at hospital tomorrow.

## 2017-10-15 ENCOUNTER — Ambulatory Visit (HOSPITAL_COMMUNITY)
Admission: RE | Admit: 2017-10-15 | Discharge: 2017-10-16 | Disposition: A | Discharge status: Home / Self Care | Payer: Medicare Other | Source: Ambulatory Visit | Attending: Cardiovascular Disease | Admitting: Cardiovascular Disease

## 2017-10-15 ENCOUNTER — Other Ambulatory Visit: Payer: Self-pay

## 2017-10-15 ENCOUNTER — Encounter (HOSPITAL_COMMUNITY): Payer: Self-pay | Admitting: Cardiovascular Disease

## 2017-10-15 ENCOUNTER — Encounter (HOSPITAL_COMMUNITY)
Admission: RE | Disposition: A | Discharge status: Home / Self Care | Payer: Self-pay | Source: Ambulatory Visit | Attending: Cardiovascular Disease

## 2017-10-15 DIAGNOSIS — I25110 Atherosclerotic heart disease of native coronary artery with unstable angina pectoris: Secondary | ICD-10-CM

## 2017-10-15 DIAGNOSIS — Z7982 Long term (current) use of aspirin: Secondary | ICD-10-CM | POA: Diagnosis not present

## 2017-10-15 DIAGNOSIS — K219 Gastro-esophageal reflux disease without esophagitis: Secondary | ICD-10-CM | POA: Insufficient documentation

## 2017-10-15 DIAGNOSIS — E785 Hyperlipidemia, unspecified: Secondary | ICD-10-CM | POA: Diagnosis present

## 2017-10-15 DIAGNOSIS — I200 Unstable angina: Secondary | ICD-10-CM | POA: Diagnosis present

## 2017-10-15 DIAGNOSIS — I2584 Coronary atherosclerosis due to calcified coronary lesion: Secondary | ICD-10-CM | POA: Diagnosis not present

## 2017-10-15 DIAGNOSIS — I350 Nonrheumatic aortic (valve) stenosis: Secondary | ICD-10-CM

## 2017-10-15 DIAGNOSIS — Z882 Allergy status to sulfonamides status: Secondary | ICD-10-CM | POA: Diagnosis not present

## 2017-10-15 DIAGNOSIS — E538 Deficiency of other specified B group vitamins: Secondary | ICD-10-CM | POA: Diagnosis not present

## 2017-10-15 DIAGNOSIS — M810 Age-related osteoporosis without current pathological fracture: Secondary | ICD-10-CM | POA: Insufficient documentation

## 2017-10-15 DIAGNOSIS — I6523 Occlusion and stenosis of bilateral carotid arteries: Secondary | ICD-10-CM | POA: Insufficient documentation

## 2017-10-15 DIAGNOSIS — Z902 Acquired absence of lung [part of]: Secondary | ICD-10-CM | POA: Diagnosis not present

## 2017-10-15 DIAGNOSIS — M1990 Unspecified osteoarthritis, unspecified site: Secondary | ICD-10-CM | POA: Diagnosis not present

## 2017-10-15 DIAGNOSIS — Z903 Acquired absence of stomach [part of]: Secondary | ICD-10-CM | POA: Diagnosis not present

## 2017-10-15 DIAGNOSIS — I10 Essential (primary) hypertension: Secondary | ICD-10-CM | POA: Diagnosis not present

## 2017-10-15 DIAGNOSIS — Z955 Presence of coronary angioplasty implant and graft: Secondary | ICD-10-CM

## 2017-10-15 DIAGNOSIS — E119 Type 2 diabetes mellitus without complications: Secondary | ICD-10-CM | POA: Diagnosis not present

## 2017-10-15 HISTORY — DX: Vitamin B12 deficiency anemia, unspecified: D51.9

## 2017-10-15 HISTORY — DX: Unspecified asthma, uncomplicated: J45.909

## 2017-10-15 HISTORY — DX: Cardiac murmur, unspecified: R01.1

## 2017-10-15 HISTORY — DX: Type 2 diabetes mellitus without complications: E11.9

## 2017-10-15 HISTORY — PX: CORONARY ATHERECTOMY: CATH118238

## 2017-10-15 HISTORY — DX: Personal history of other diseases of the digestive system: Z87.19

## 2017-10-15 HISTORY — PX: CORONARY STENT INTERVENTION: CATH118234

## 2017-10-15 LAB — BASIC METABOLIC PANEL
Anion gap: 3 — ABNORMAL LOW (ref 5–15)
BUN: 21 mg/dL (ref 8–23)
CHLORIDE: 107 mmol/L (ref 98–111)
CO2: 31 mmol/L (ref 22–32)
CREATININE: 1.29 mg/dL — AB (ref 0.44–1.00)
Calcium: 9.1 mg/dL (ref 8.9–10.3)
GFR calc Af Amer: 44 mL/min — ABNORMAL LOW (ref >60)
GFR calc non Af Amer: 38 mL/min — ABNORMAL LOW (ref >60)
Glucose, Bld: 99 mg/dL (ref 70–99)
Potassium: 3.9 mmol/L (ref 3.5–5.1)
SODIUM: 141 mmol/L (ref 135–145)

## 2017-10-15 LAB — CBC WITH DIFFERENTIAL/PLATELET
Abs Immature Granulocytes: 0 10*3/uL (ref 0.0–0.1)
BASOS ABS: 0 10*3/uL (ref 0.0–0.1)
Basophils Relative: 1 %
EOS ABS: 0.1 10*3/uL (ref 0.0–0.7)
Eosinophils Relative: 3 %
HCT: 32 % — ABNORMAL LOW (ref 36.0–46.0)
HEMOGLOBIN: 10.9 g/dL — AB (ref 12.0–15.0)
IMMATURE GRANULOCYTES: 0 %
LYMPHS ABS: 1.2 10*3/uL (ref 0.7–4.0)
LYMPHS PCT: 28 %
MCH: 30.6 pg (ref 26.0–34.0)
MCHC: 34.1 g/dL (ref 30.0–36.0)
MCV: 89.9 fL (ref 78.0–100.0)
Monocytes Absolute: 0.3 10*3/uL (ref 0.1–1.0)
Monocytes Relative: 8 %
NEUTROS PCT: 60 %
Neutro Abs: 2.6 10*3/uL (ref 1.7–7.7)
Platelets: 83 10*3/uL — ABNORMAL LOW (ref 150–400)
RBC: 3.56 MIL/uL — AB (ref 3.87–5.11)
RDW: 13 % (ref 11.5–15.5)
WBC: 4.4 10*3/uL (ref 4.0–10.5)

## 2017-10-15 LAB — GLUCOSE, CAPILLARY
GLUCOSE-CAPILLARY: 107 mg/dL — AB (ref 70–99)
GLUCOSE-CAPILLARY: 94 mg/dL (ref 70–99)
Glucose-Capillary: 84 mg/dL (ref 70–99)

## 2017-10-15 LAB — POCT ACTIVATED CLOTTING TIME
Activated Clotting Time: 439 seconds
Activated Clotting Time: 511 seconds
Activated Clotting Time: 637 seconds

## 2017-10-15 SURGERY — CORONARY STENT INTERVENTION
Anesthesia: LOCAL

## 2017-10-15 MED ORDER — MIDAZOLAM HCL 2 MG/2ML IJ SOLN
INTRAMUSCULAR | Status: AC
  Filled 2017-10-15: qty 2

## 2017-10-15 MED ORDER — VERAPAMIL HCL 2.5 MG/ML IV SOLN
INTRAVENOUS | Status: AC
  Filled 2017-10-15: qty 2

## 2017-10-15 MED ORDER — HEPARIN (PORCINE) IN NACL 1000-0.9 UT/500ML-% IV SOLN
INTRAVENOUS | Status: AC
  Filled 2017-10-15: qty 1000

## 2017-10-15 MED ORDER — SODIUM CHLORIDE 0.9 % IV SOLN
INTRAVENOUS | Status: AC
  Administered 2017-10-15: 15:00:00 via INTRAVENOUS

## 2017-10-15 MED ORDER — ASPIRIN 81 MG PO CHEW: 81.0000 mg | CHEWABLE_TABLET | ORAL | Status: DC

## 2017-10-15 MED ORDER — NITROGLYCERIN 0.4 MG SL SUBL
0.4000 mg | SUBLINGUAL_TABLET | SUBLINGUAL | Status: DC | PRN
Start: 2017-10-15 — End: 2017-10-16

## 2017-10-15 MED ORDER — IOPAMIDOL (ISOVUE-370) INJECTION 76%
INTRAVENOUS | Status: DC | PRN
  Administered 2017-10-15: 125 mL via INTRA_ARTERIAL

## 2017-10-15 MED ORDER — SODIUM CHLORIDE 0.9% FLUSH: 3.0000 mL | Freq: Two times a day (BID) | INTRAVENOUS | Status: DC

## 2017-10-15 MED ORDER — MIDAZOLAM HCL 2 MG/2ML IJ SOLN
INTRAMUSCULAR | Status: DC | PRN
  Administered 2017-10-15: 1 mg via INTRAVENOUS

## 2017-10-15 MED ORDER — ISOSORBIDE MONONITRATE ER 30 MG PO TB24
30.0000 mg | ORAL_TABLET | Freq: Every day | ORAL | Status: DC
  Administered 2017-10-16: 10:00:00 30 mg via ORAL
  Filled 2017-10-15: qty 1

## 2017-10-15 MED ORDER — FENTANYL CITRATE (PF) 100 MCG/2ML IJ SOLN
INTRAMUSCULAR | Status: AC
  Filled 2017-10-15: qty 2

## 2017-10-15 MED ORDER — SODIUM CHLORIDE 0.9 % IV SOLN: 250.0000 mL | INTRAVENOUS | Status: DC | PRN

## 2017-10-15 MED ORDER — NITROGLYCERIN 1 MG/10 ML FOR IR/CATH LAB
INTRA_ARTERIAL | Status: DC | PRN
  Administered 2017-10-15 (×3): 200 ug via INTRACORONARY

## 2017-10-15 MED ORDER — HEPARIN SODIUM (PORCINE) 1000 UNIT/ML IJ SOLN
INTRAMUSCULAR | Status: DC | PRN
  Administered 2017-10-15: 9000 [IU] via INTRAVENOUS

## 2017-10-15 MED ORDER — METOPROLOL SUCCINATE ER 25 MG PO TB24
25.0000 mg | ORAL_TABLET | Freq: Every day | ORAL | Status: DC
  Administered 2017-10-15: 21:00:00 25 mg via ORAL
  Filled 2017-10-15: qty 1

## 2017-10-15 MED ORDER — ONDANSETRON HCL 4 MG/2ML IJ SOLN: 4.0000 mg | Freq: Four times a day (QID) | INTRAMUSCULAR | Status: DC | PRN

## 2017-10-15 MED ORDER — SODIUM CHLORIDE 0.9% FLUSH
3.0000 mL | INTRAVENOUS | Status: DC | PRN
Start: 2017-10-15 — End: 2017-10-16

## 2017-10-15 MED ORDER — HEPARIN (PORCINE) IN NACL 1000-0.9 UT/500ML-% IV SOLN
INTRAVENOUS | Status: DC | PRN
  Administered 2017-10-15: 500 mL

## 2017-10-15 MED ORDER — LOSARTAN POTASSIUM 50 MG PO TABS
100.0000 mg | ORAL_TABLET | Freq: Every day | ORAL | Status: DC
  Administered 2017-10-16: 100 mg via ORAL
  Filled 2017-10-15: qty 2

## 2017-10-15 MED ORDER — NITROGLYCERIN 1 MG/10 ML FOR IR/CATH LAB
INTRA_ARTERIAL | Status: AC
  Filled 2017-10-15: qty 10

## 2017-10-15 MED ORDER — IOHEXOL 350 MG/ML SOLN
INTRAVENOUS | Status: DC | PRN
  Administered 2017-10-15: 45 mL via INTRA_ARTERIAL

## 2017-10-15 MED ORDER — SODIUM BICARBONATE BOLUS VIA INFUSION
INTRAVENOUS | Status: DC
  Filled 2017-10-15: qty 1

## 2017-10-15 MED ORDER — IOPAMIDOL (ISOVUE-370) INJECTION 76%
INTRAVENOUS | Status: AC
  Filled 2017-10-15: qty 125

## 2017-10-15 MED ORDER — ASPIRIN EC 81 MG PO TBEC
81.0000 mg | DELAYED_RELEASE_TABLET | Freq: Every day | ORAL | Status: DC
  Administered 2017-10-16: 81 mg via ORAL
  Filled 2017-10-15: qty 1

## 2017-10-15 MED ORDER — SODIUM CHLORIDE 0.9% FLUSH: 3.0000 mL | INTRAVENOUS | Status: DC | PRN

## 2017-10-15 MED ORDER — HEPARIN SODIUM (PORCINE) 1000 UNIT/ML IJ SOLN
INTRAMUSCULAR | Status: AC
  Filled 2017-10-15: qty 1

## 2017-10-15 MED ORDER — LIDOCAINE HCL (PF) 1 % IJ SOLN
INTRAMUSCULAR | Status: AC
  Filled 2017-10-15: qty 30

## 2017-10-15 MED ORDER — SODIUM BICARBONATE 8.4 % IV SOLN
INTRAVENOUS | Status: DC
  Filled 2017-10-15: qty 500

## 2017-10-15 MED ORDER — CLOPIDOGREL BISULFATE 75 MG PO TABS
75.0000 mg | ORAL_TABLET | Freq: Every day | ORAL | Status: DC
  Administered 2017-10-16: 75 mg via ORAL
  Filled 2017-10-15: qty 1

## 2017-10-15 MED ORDER — ANGIOPLASTY BOOK
Freq: Once | Status: AC
  Administered 2017-10-16: 06:00:00
  Filled 2017-10-15: qty 1

## 2017-10-15 MED ORDER — ACETAMINOPHEN 325 MG PO TABS: 650.0000 mg | ORAL_TABLET | ORAL | Status: DC | PRN

## 2017-10-15 MED ORDER — FENTANYL CITRATE (PF) 100 MCG/2ML IJ SOLN
INTRAMUSCULAR | Status: DC | PRN
  Administered 2017-10-15: 25 ug via INTRAVENOUS

## 2017-10-15 MED ORDER — VERAPAMIL HCL 2.5 MG/ML IV SOLN
INTRAVENOUS | Status: DC | PRN
  Administered 2017-10-15: 13:00:00 via INTRA_ARTERIAL

## 2017-10-15 MED ORDER — HYDRALAZINE HCL 20 MG/ML IJ SOLN: 5.0000 mg | INTRAMUSCULAR | Status: AC | PRN

## 2017-10-15 MED ORDER — LABETALOL HCL 5 MG/ML IV SOLN: 10.0000 mg | INTRAVENOUS | Status: AC | PRN

## 2017-10-15 MED ORDER — LIDOCAINE HCL (PF) 1 % IJ SOLN
INTRAMUSCULAR | Status: DC | PRN
  Administered 2017-10-15: 2 mL

## 2017-10-15 MED ORDER — SODIUM CHLORIDE 0.9 % IV SOLN
INTRAVENOUS | Status: DC
  Administered 2017-10-15: 07:00:00 via INTRAVENOUS

## 2017-10-15 SURGICAL SUPPLY — 24 items
BALLN SAPPHIRE 2.0X10 (BALLOONS) ×2
BALLN SAPPHIRE 2.5X15 (BALLOONS) ×2
BALLN SAPPHIRE ~~LOC~~ 3.0X15 (BALLOONS) ×2 IMPLANT
BALLN SAPPHIRE ~~LOC~~ 3.25X12 (BALLOONS) ×2 IMPLANT
BALLOON SAPPHIRE 2.0X10 (BALLOONS) ×1 IMPLANT
BALLOON SAPPHIRE 2.5X15 (BALLOONS) ×1 IMPLANT
CATH LAUNCHER 6FR AL.75 (CATHETERS) ×2 IMPLANT
CATH LAUNCHER 6FR AR1 (CATHETERS) ×2 IMPLANT
CATH VISTA GUIDE 6FR XB3 (CATHETERS) ×2 IMPLANT
CROWN DIAMONDBACK CLASSIC 1.25 (BURR) ×2 IMPLANT
DEVICE RAD COMP TR BAND LRG (VASCULAR PRODUCTS) ×2 IMPLANT
GLIDESHEATH SLEND SS 6F .021 (SHEATH) ×2 IMPLANT
GUIDEWIRE INQWIRE 1.5J.035X260 (WIRE) ×1 IMPLANT
INQWIRE 1.5J .035X260CM (WIRE) ×2
KIT ENCORE 26 ADVANTAGE (KITS) ×2 IMPLANT
KIT HEART LEFT (KITS) ×2 IMPLANT
LUBRICANT VIPERSLIDE CORONARY (MISCELLANEOUS) ×2 IMPLANT
PACK CARDIAC CATHETERIZATION (CUSTOM PROCEDURE TRAY) ×2 IMPLANT
STENT SYNERGY DES 3X16 (Permanent Stent) ×2 IMPLANT
STENT SYNERGY DES 3X20 (Permanent Stent) ×2 IMPLANT
TRANSDUCER W/STOPCOCK (MISCELLANEOUS) ×2 IMPLANT
TUBING CIL FLEX 10 FLL-RA (TUBING) ×2 IMPLANT
WIRE COUGAR XT STRL 190CM (WIRE) ×2 IMPLANT
WIRE VIPER ADVANCE COR .012TIP (WIRE) ×2 IMPLANT

## 2017-10-15 NOTE — Interval H&P Note (Signed)
History and Physical Interval Note:  10/15/2017 12:17 PM  Stacy Cook  has presented today for cardiac cath/PCI with the diagnosis of severe CAD with unstable angina. The various methods of treatment have been discussed with the patient and family. After consideration of risks, benefits and other options for treatment, the patient has consented to  Procedure(s): CORONARY STENT INTERVENTION (N/A) as a surgical intervention .  The patient's history has been reviewed, patient examined, no change in status, stable for surgery.  I have reviewed the patient's chart and labs.  Questions were answered to the patient's satisfaction.    Cath Lab Visit (complete for each Cath Lab visit)  Clinical Evaluation Leading to the Procedure:   ACS: No.  Non-ACS:    Anginal Classification: CCS III  Anti-ischemic medical therapy: Maximal Therapy (2 or more classes of medications)  Non-Invasive Test Results: No non-invasive testing performed  Prior CABG: No previous CABG        Lauree Chandler

## 2017-10-15 NOTE — Progress Notes (Signed)
TR BAND REMOVAL  LOCATION:    right radial  DEFLATED PER PROTOCOL:    Yes.    TIME BAND OFF / DRESSING APPLIED:    1830   SITE UPON ARRIVAL:    Level 0  SITE AFTER BAND REMOVAL:    Level 0  CIRCULATION SENSATION AND MOVEMENT:    Within Normal Limits   Yes.    COMMENTS:   Rechecked at 1900 with no change in assessment

## 2017-10-16 DIAGNOSIS — I2584 Coronary atherosclerosis due to calcified coronary lesion: Secondary | ICD-10-CM | POA: Diagnosis not present

## 2017-10-16 DIAGNOSIS — I350 Nonrheumatic aortic (valve) stenosis: Secondary | ICD-10-CM

## 2017-10-16 DIAGNOSIS — I25110 Atherosclerotic heart disease of native coronary artery with unstable angina pectoris: Secondary | ICD-10-CM | POA: Diagnosis not present

## 2017-10-16 DIAGNOSIS — E785 Hyperlipidemia, unspecified: Secondary | ICD-10-CM | POA: Diagnosis not present

## 2017-10-16 DIAGNOSIS — I10 Essential (primary) hypertension: Secondary | ICD-10-CM | POA: Diagnosis not present

## 2017-10-16 DIAGNOSIS — I25119 Atherosclerotic heart disease of native coronary artery with unspecified angina pectoris: Secondary | ICD-10-CM | POA: Diagnosis not present

## 2017-10-16 LAB — BASIC METABOLIC PANEL
Anion gap: 7 (ref 5–15)
BUN: 19 mg/dL (ref 8–23)
CO2: 23 mmol/L (ref 22–32)
Calcium: 8.7 mg/dL — ABNORMAL LOW (ref 8.9–10.3)
Chloride: 111 mmol/L (ref 98–111)
Creatinine, Ser: 1.19 mg/dL — ABNORMAL HIGH (ref 0.44–1.00)
GFR calc Af Amer: 49 mL/min — ABNORMAL LOW (ref >60)
GFR, EST NON AFRICAN AMERICAN: 42 mL/min — AB (ref >60)
GLUCOSE: 100 mg/dL — AB (ref 70–99)
POTASSIUM: 3.9 mmol/L (ref 3.5–5.1)
Sodium: 141 mmol/L (ref 135–145)

## 2017-10-16 LAB — GLUCOSE, CAPILLARY: Glucose-Capillary: 78 mg/dL (ref 70–99)

## 2017-10-16 LAB — CBC
HEMATOCRIT: 29.1 % — AB (ref 36.0–46.0)
Hemoglobin: 9.7 g/dL — ABNORMAL LOW (ref 12.0–15.0)
MCH: 30.1 pg (ref 26.0–34.0)
MCHC: 33.3 g/dL (ref 30.0–36.0)
MCV: 90.4 fL (ref 78.0–100.0)
Platelets: 82 10*3/uL — ABNORMAL LOW (ref 150–400)
RBC: 3.22 MIL/uL — ABNORMAL LOW (ref 3.87–5.11)
RDW: 13 % (ref 11.5–15.5)
WBC: 4.4 10*3/uL (ref 4.0–10.5)

## 2017-10-16 MED ORDER — PANTOPRAZOLE SODIUM 40 MG PO TBEC: 40.0000 mg | DELAYED_RELEASE_TABLET | Freq: Every day | ORAL | 1 refills | Status: DC

## 2017-10-16 NOTE — Discharge Summary (Addendum)
Discharge Summary    Patient ID: Stacy Cook,  MRN: [id number], DOB/AGE: [birth year] 82 y.o.  Admit date: 10/15/2017 Discharge date: 10/16/2017  Primary Care Provider: [facility] Primary Cardiologist: Dr. Clayborn Bigness  Discharge Diagnoses    Active Problems:   Unstable angina ([place])   HLD (hyperlipidemia)   AS (aortic stenosis)  Allergies Allergies  Allergen Reactions  . Pneumococcal Vaccine Itching and Other (See Comments)    Hives and fever  . Statins Other (See Comments)    Joint pain  . Sulfa Antibiotics Nausea And Vomiting    Diagnostic Studies/Procedures    Cath: 10/15/17  Conclusion     Prox RCA to Mid RCA lesion is 50% stenosed.  Dist RCA lesion is 90% stenosed.  A drug-eluting stent was successfully placed using a STENT SYNERGY DES 3X16.  Post intervention, there is a 0% residual stenosis.  Prox Cx to Mid Cx lesion is 95% stenosed.  Prox LAD lesion is 70% stenosed.  A drug-eluting stent was successfully placed using a STENT SYNERGY DES 3X20.  Post intervention, there is a 0% residual stenosis.   1. Severe stenosis mid Circumflex. Successful PTCA/orbital atherectomy/DES placement 2. Severe stenosis distal RCA. Successful PTCA/DES x 1 distal RCA  Recommendations: Will continue DAPT with ASA and Plavix for one year. Her aortic stenosis is felt to be moderately severe by echo last week. Peak to peak gradient today by cath 17 mmHg. I reviewed her echo images with the TAVR team. We will hold off on TAVR right now. She will likely need TAVR within the next 6-18 months.   Recommend uninterrupted dual antiplatelet therapy with Aspirin 81mg  daily for a minimum of 12 months (ACS - Class I recommendation). (Plavix in addition to ASA).   _____________   History of Present Illness     82 yo female with history of CAD, anemia, gastric cancer, lung cancer, carotid artery disease, diabetes mellitus, hyperlipidemia, HTN and severe aortic stenosis who  presented as a new consult for Dr. Angelena Form for further discussion regarding her aortic valve stenosis. She was referred by Dr. Lujean Amel. She was known to have severe aortic stenosis. Most recent echocardiogram August 2018 with normal LV systolic function, WIOX=73%, moderate LVH. Severe aortic stenosis with mean gradient 43 mmHg, peak gradient 86 mmHg. Mild AI, mild MR. Cardiac cath June 2019 at [facility] per Dr. Clayborn Bigness with severe three vessel CAD. She also has a history of anemia, GERD, bilateral carotid artery disease, diabetes mellitus and arthritis. She has had gastric cancer and is s/p partial gastrectomy in 2018. She has had lung cancer is s/p right lower lobectomy in 2017. She has hyperlipidemia and is statin intolerant.   She reported at this appt that she has dyspnea with minimal exertion. She is not active. She has had several episodes of chest pressure over the past month. She had occasional dizziness. She had an episode of near syncope in early June. She had chest pressure and felt dizzy then nauseous. She sat down and waited it out. She saw primary care and was then sent back to Dr. Clayborn Bigness. Heart cath was planned. No lower extremity edema. She currently lives alone but has family nearby. She is a widow. She is retired Immunologist. She sees the dentist regularly and has no active dental issues. She has never smoked. Her case was reviewed by Dr. Angelena Form and she was felt to be a candidate for TAVR. Given her known disease was sent  for cath with plans for PCI.   Hospital Course     Underwent cath noted above with PCI/DES to the distal RCA, and orbital atherectomy/DES to the mLCx. Plan to continue on DAPT with ASA/plavix for at least one year. Morning labs were stable post cath. No further chest discomfort while working with cardiac rehab. In regards to her valve, her gradient was 54mmHg by cath and felt to be moderately severe. Images were reviewed with the TAVR  team. Will likely need TAVR in the future maybe within the next 6-18 months, but will hold on further work up for now. Of note she was on rabeprazole prior to admission. Discussed with PharmD and there is concern for interaction with Plavix. Therefore will which her to Protonix at discharge. Has been intolerant to statins in the past, consider addition of PCSK9s as an outpatient. Will defer to her primary cardiologist.   General: Well developed, well nourished, female appearing in no acute distress. Head: Normocephalic, atraumatic.  Neck: Supple without bruits, JVD. Lungs:  Resp regular and unlabored, CTA. Heart: RRR, S1, S2, harsh systolic murmur; no rub. Abdomen: Soft, non-tender, non-distended with normoactive bowel sounds. No hepatomegaly. No rebound/guarding. No obvious abdominal masses. Extremities: No clubbing, cyanosis, edema. Distal pedal pulses are 2+ bilaterally. Right radial cath site stable without bruising or hematoma Neuro: Alert and oriented X 3. Moves all extremities spontaneously. Psych: Normal affect.  Carlisle Cater Mckamey was seen by Dr. Burt Knack and determined stable for discharge home. Follow up in the office has been arranged. Medications are listed below.   _____________  Discharge Vitals Blood pressure (!) 127/40, pulse (!) 58, temperature 98.1 F (36.7 C), resp. rate 12, height 5\' 1"  (1.549 m), weight 129 lb 3 oz (58.6 kg), SpO2 100 %.  Filed Weights   10/15/17 0709 10/16/17 0542  Weight: 131 lb (59.4 kg) 129 lb 3 oz (58.6 kg)    Labs & Radiologic Studies    CBC Recent Labs    10/15/17 0718 10/16/17 0247  WBC 4.4 4.4  NEUTROABS 2.6  --   HGB 10.9* 9.7*  HCT 32.0* 29.1*  MCV 89.9 90.4  PLT 83* 82*   Basic Metabolic Panel Recent Labs    10/15/17 0724 10/16/17 0247  NA 141 141  K 3.9 3.9  CL 107 111  CO2 31 23  GLUCOSE 99 100*  BUN 21 19  CREATININE 1.29* 1.19*  CALCIUM 9.1 8.7*   Liver Function Tests No results for input(s): AST, ALT, ALKPHOS,  BILITOT, PROT, ALBUMIN in the last 72 hours. No results for input(s): LIPASE, AMYLASE in the last 72 hours. Cardiac Enzymes No results for input(s): CKTOTAL, CKMB, CKMBINDEX, TROPONINI in the last 72 hours. BNP Invalid input(s): POCBNP D-Dimer No results for input(s): DDIMER in the last 72 hours. Hemoglobin A1C No results for input(s): HGBA1C in the last 72 hours. Fasting Lipid Panel No results for input(s): CHOL, HDL, LDLCALC, TRIG, CHOLHDL, LDLDIRECT in the last 72 hours. Thyroid Function Tests No results for input(s): TSH, T4TOTAL, T3FREE, THYROIDAB in the last 72 hours.  Invalid input(s): FREET3 _____________  No results found. Disposition   Pt is being discharged home today in good condition.  Follow-up Plans & Appointments    Follow-up Information    Yolonda Kida, MD Follow up.   Specialties:  Cardiology, Internal Medicine Why:  Please make appt to see your cardiologist back within the next 2 weeks.  Contact information: [place] [address] [phone]  Discharge Instructions    Amb Referral to Cardiac Rehabilitation   Complete by:  As directed    Diagnosis:   Coronary Stents PTCA     Diet - low sodium heart healthy   Complete by:  As directed    Discharge instructions   Complete by:  As directed    Radial Site Care Refer to this sheet in the next few weeks. These instructions provide you with information on caring for yourself after your procedure. Your caregiver may also give you more specific instructions. Your treatment has been planned according to current medical practices, but problems sometimes occur. Call your caregiver if you have any problems or questions after your procedure. HOME CARE INSTRUCTIONS You may shower the day after the procedure.Remove the bandage (dressing) and gently wash the site with plain soap and water.Gently pat the site dry.  Do not apply powder or lotion to the site.  Do not submerge  the affected site in water for 3 to 5 days.  Inspect the site at least twice daily.  Do not flex or bend the affected arm for 24 hours.  No lifting over 5 pounds (2.3 kg) for 5 days after your procedure.  Do not drive home if you are discharged the same day of the procedure. Have someone else drive you.  You may drive 24 hours after the procedure unless otherwise instructed by your caregiver.  What to expect: Any bruising will usually fade within 1 to 2 weeks.  Blood that collects in the tissue (hematoma) may be painful to the touch. It should usually decrease in size and tenderness within 1 to 2 weeks.  SEEK IMMEDIATE MEDICAL CARE IF: You have unusual pain at the radial site.  You have redness, warmth, swelling, or pain at the radial site.  You have drainage (other than a small amount of blood on the dressing).  You have chills.  You have a fever or persistent symptoms for more than 72 hours.  You have a fever and your symptoms suddenly get worse.  Your arm becomes pale, cool, tingly, or numb.  You have heavy bleeding from the site. Hold pressure on the site.   PLEASE DO NOT MISS ANY DOSES OF YOUR PLAVIX!!!!! Also keep a log of you blood pressures and bring back to your follow up appt. Please call the office with any questions.   Patients taking blood thinners should generally stay away from medicines like ibuprofen, Advil, Motrin, naproxen, and Aleve due to risk of stomach bleeding. You may take Tylenol as directed or talk to your primary doctor about alternatives.  Some studies suggest Prilosec/Omeprazole/rabeprazole interacts with Plavix. We changed your medication to the equivalent dose of Protonix for less chance of interaction.   Increase activity slowly   Complete by:  As directed        Discharge Medications     Medication List    STOP taking these medications   RABEprazole 20 MG tablet Commonly known as:  ACIPHEX     TAKE these medications   aspirin EC 81 MG  tablet Take 81 mg by mouth daily.   cholecalciferol 1000 units tablet Commonly known as:  VITAMIN D Take 1,000 Units by mouth daily.   clopidogrel 75 MG tablet Commonly known as:  PLAVIX Take 1 tablet (75 mg total) by mouth daily.   cyanocobalamin 1000 MCG tablet Take 1,000 mcg by mouth daily.   ferrous sulfate 325 (65 FE) MG tablet Take 325 mg by mouth daily with  breakfast.   HYDROcodone-acetaminophen 7.5-325 MG tablet Commonly known as:  NORCO Take 1 tablet by mouth 4 (four) times daily as needed for moderate pain.   isosorbide mononitrate 30 MG 24 hr tablet Commonly known as:  IMDUR Take 30 mg by mouth daily.   losartan 100 MG tablet Commonly known as:  COZAAR Take 100 mg by mouth daily.   metoprolol succinate 25 MG 24 hr tablet Commonly known as:  TOPROL-XL Take 25 mg by mouth at bedtime.   nitroGLYCERIN 0.4 MG SL tablet Commonly known as:  NITROSTAT Place 0.4 mg under the tongue every 5 (five) minutes as needed for chest pain.   pantoprazole 40 MG tablet Commonly known as:  PROTONIX Take 1 tablet (40 mg total) by mouth daily.   tizanidine 2 MG capsule Commonly known as:  ZANAFLEX Take 2-4 mg by mouth 2 (two) times daily as needed for muscle spasms.        Acute coronary syndrome (MI, NSTEMI, STEMI, etc) this admission?: No.     Outstanding Labs/Studies   N/a   Duration of Discharge Encounter   Greater than 30 minutes including physician time.  Signed, Reino Bellis NP-C 10/16/2017, 9:31 AM  Patient seen, examined. Available data reviewed. Agree with findings, assessment, and plan as outlined by Reino Bellis, NP-C. On my exam: Vitals:   10/16/17 0542 10/16/17 0741  BP: (!) 118/36 (!) 127/40  Pulse: (!) 53 (!) 58  Resp: 10 12  Temp: 98 F (36.7 C) 98.1 F (36.7 C)  SpO2: 99% 100%   Pt is alert and oriented, NAD HEENT: normal Neck: JVP - normal Lungs: CTA bilaterally CV: RRR with 3/6 mid-peaking harsh systolic murmur at the  RUSB Abd: soft, NT, Positive BS, no hepatomegaly Ext: no edema, right radial site clear Skin: warm/dry no rash  Data reviewed: successful 2 vessel PCI yesterday. Has been taking ASA/clopidogrel. FU Dr Clayborn Bigness and valve clinic with Dr Angelena Form. Otherwise plans as above.   [name], M.D. 10/16/2017 10:34 AM

## 2017-10-16 NOTE — Progress Notes (Signed)
CARDIAC REHAB PHASE I   PRE:  Rate/Rhythm: 64 SR    BP: sitting 143/41    SaO2:   MODE:  Ambulation: 450 ft   POST:  Rate/Rhythm: 79 SR    BP: sitting 155/30     SaO2:   Pt tolerated well, no c/o. Feels good. Ed completed with good reception. Will refer to [place]. Understands the importance of Plavix/ASA.  [place], ACSM 10/16/2017 8:44 AM

## 2017-10-21 ENCOUNTER — Ambulatory Visit (HOSPITAL_COMMUNITY): Payer: Medicare Other

## 2017-10-21 ENCOUNTER — Encounter (HOSPITAL_COMMUNITY): Payer: Medicare Other
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
## 2017-10-24 ENCOUNTER — Encounter: Payer: Self-pay | Admitting: Physician Assistant

## 2017-10-24 ENCOUNTER — Ambulatory Visit: Payer: Medicare Other | Admitting: Physician Assistant

## 2017-10-24 ENCOUNTER — Telehealth: Payer: Self-pay | Admitting: Physician Assistant

## 2017-10-24 VITALS — BP 146/64 | HR 62 | Ht 61.0 in | Wt 131.0 lb

## 2017-10-24 DIAGNOSIS — E785 Hyperlipidemia, unspecified: Secondary | ICD-10-CM | POA: Diagnosis not present

## 2017-10-24 DIAGNOSIS — I350 Nonrheumatic aortic (valve) stenosis: Secondary | ICD-10-CM | POA: Diagnosis not present

## 2017-10-24 DIAGNOSIS — I2510 Atherosclerotic heart disease of native coronary artery without angina pectoris: Secondary | ICD-10-CM

## 2017-10-24 DIAGNOSIS — Z9861 Coronary angioplasty status: Secondary | ICD-10-CM | POA: Diagnosis not present

## 2017-10-24 DIAGNOSIS — I10 Essential (primary) hypertension: Secondary | ICD-10-CM

## 2017-10-24 NOTE — Patient Instructions (Addendum)
Medication Instructions:  Your physician recommends that you continue on your current medications as directed. Please refer to the Current Medication list given to you today.   Labwork: None Ordered   Testing/Procedures: Your physician has requested that you have an echocardiogram. Echocardiography is a painless test that uses sound waves to create images of your heart. It provides your doctor with information about the size and shape of your heart and how well your heart's chambers and valves are working. This procedure takes approximately one hour. There are no restrictions for this procedure.   Follow-Up: Your physician recommends that you schedule a follow-up appointment with Dr. Angelena Form to discuss TAVR Someone from our office will call you to schedule - plan for appointment on Friday October 18   If you need a refill on your cardiac medications before your next appointment, please call your pharmacy.   Thank you for choosing [facility]! Christen Bame, RN [phone]

## 2017-10-24 NOTE — Telephone Encounter (Signed)
New Message    Pt states she is returning call for nurse

## 2017-10-24 NOTE — Telephone Encounter (Signed)
Spoke with patient who was returning a call to our billing department for help with a billing question. I transferred the call to that department.

## 2017-10-30 ENCOUNTER — Ambulatory Visit: Payer: Medicare Other | Admitting: Physical Therapy

## 2017-10-30 ENCOUNTER — Encounter: Payer: Medicare Other | Admitting: Thoracic Surgery (Cardiothoracic Vascular Surgery)

## 2017-12-23 ENCOUNTER — Other Ambulatory Visit: Payer: Self-pay | Admitting: Internal Medicine

## 2017-12-23 DIAGNOSIS — R3129 Other microscopic hematuria: Secondary | ICD-10-CM

## 2017-12-23 DIAGNOSIS — N200 Calculus of kidney: Secondary | ICD-10-CM | POA: Insufficient documentation

## 2017-12-26 ENCOUNTER — Ambulatory Visit
Admission: RE | Admit: 2017-12-26 | Discharge: 2017-12-26 | Disposition: A | Discharge status: Home / Self Care | Payer: Medicare Other | Source: Ambulatory Visit | Attending: Internal Medicine | Admitting: Internal Medicine

## 2017-12-26 DIAGNOSIS — R3129 Other microscopic hematuria: Secondary | ICD-10-CM

## 2017-12-26 DIAGNOSIS — N200 Calculus of kidney: Secondary | ICD-10-CM | POA: Insufficient documentation

## 2017-12-30 ENCOUNTER — Inpatient Hospital Stay: Payer: Medicare Other | Attending: Internal Medicine

## 2017-12-30 ENCOUNTER — Inpatient Hospital Stay (HOSPITAL_BASED_OUTPATIENT_CLINIC_OR_DEPARTMENT_OTHER): Payer: Medicare Other | Admitting: Internal Medicine

## 2017-12-30 ENCOUNTER — Encounter: Payer: Self-pay | Admitting: Internal Medicine

## 2017-12-30 ENCOUNTER — Telehealth: Payer: Self-pay | Admitting: Internal Medicine

## 2017-12-30 ENCOUNTER — Inpatient Hospital Stay: Payer: Medicare Other

## 2017-12-30 ENCOUNTER — Other Ambulatory Visit: Payer: Self-pay

## 2017-12-30 VITALS — BP 138/57 | HR 59 | Temp 96.0°F | Resp 18

## 2017-12-30 VITALS — BP 139/56 | HR 60 | Temp 97.2°F | Resp 20 | Ht 61.0 in | Wt 131.0 lb

## 2017-12-30 DIAGNOSIS — I350 Nonrheumatic aortic (valve) stenosis: Secondary | ICD-10-CM | POA: Diagnosis not present

## 2017-12-30 DIAGNOSIS — Z79899 Other long term (current) drug therapy: Secondary | ICD-10-CM | POA: Insufficient documentation

## 2017-12-30 DIAGNOSIS — N183 Chronic kidney disease, stage 3 (moderate): Secondary | ICD-10-CM

## 2017-12-30 DIAGNOSIS — I129 Hypertensive chronic kidney disease with stage 1 through stage 4 chronic kidney disease, or unspecified chronic kidney disease: Secondary | ICD-10-CM | POA: Diagnosis not present

## 2017-12-30 DIAGNOSIS — Z8711 Personal history of peptic ulcer disease: Secondary | ICD-10-CM

## 2017-12-30 DIAGNOSIS — M129 Arthropathy, unspecified: Secondary | ICD-10-CM | POA: Insufficient documentation

## 2017-12-30 DIAGNOSIS — D693 Immune thrombocytopenic purpura: Secondary | ICD-10-CM

## 2017-12-30 DIAGNOSIS — E119 Type 2 diabetes mellitus without complications: Secondary | ICD-10-CM

## 2017-12-30 DIAGNOSIS — E785 Hyperlipidemia, unspecified: Secondary | ICD-10-CM | POA: Diagnosis not present

## 2017-12-30 DIAGNOSIS — I2510 Atherosclerotic heart disease of native coronary artery without angina pectoris: Secondary | ICD-10-CM | POA: Diagnosis not present

## 2017-12-30 DIAGNOSIS — C3431 Malignant neoplasm of lower lobe, right bronchus or lung: Secondary | ICD-10-CM

## 2017-12-30 DIAGNOSIS — D509 Iron deficiency anemia, unspecified: Secondary | ICD-10-CM

## 2017-12-30 DIAGNOSIS — C160 Malignant neoplasm of cardia: Secondary | ICD-10-CM

## 2017-12-30 DIAGNOSIS — D508 Other iron deficiency anemias: Secondary | ICD-10-CM

## 2017-12-30 DIAGNOSIS — K909 Intestinal malabsorption, unspecified: Secondary | ICD-10-CM | POA: Insufficient documentation

## 2017-12-30 DIAGNOSIS — Z85828 Personal history of other malignant neoplasm of skin: Secondary | ICD-10-CM | POA: Diagnosis not present

## 2017-12-30 LAB — CBC WITH DIFFERENTIAL/PLATELET
Basophils Absolute: 0 10*3/uL (ref 0–0.1)
Basophils Relative: 1 %
Eosinophils Absolute: 0.3 10*3/uL (ref 0–0.7)
Eosinophils Relative: 6 %
HEMATOCRIT: 30.2 % — AB (ref 35.0–47.0)
HEMOGLOBIN: 10.6 g/dL — AB (ref 12.0–16.0)
LYMPHS PCT: 30 %
Lymphs Abs: 1.6 10*3/uL (ref 1.0–3.6)
MCH: 32.1 pg (ref 26.0–34.0)
MCHC: 35.2 g/dL (ref 32.0–36.0)
MCV: 91.1 fL (ref 80.0–100.0)
MONOS PCT: 7 %
Monocytes Absolute: 0.4 10*3/uL (ref 0.2–0.9)
NEUTROS ABS: 3.2 10*3/uL (ref 1.4–6.5)
NEUTROS PCT: 56 %
Platelets: 84 10*3/uL — ABNORMAL LOW (ref 150–440)
RBC: 3.31 MIL/uL — AB (ref 3.80–5.20)
RDW: 12.9 % (ref 11.5–14.5)
WBC: 5.5 10*3/uL (ref 3.6–11.0)

## 2017-12-30 LAB — BASIC METABOLIC PANEL
ANION GAP: 6 (ref 5–15)
BUN: 30 mg/dL — ABNORMAL HIGH (ref 8–23)
CHLORIDE: 112 mmol/L — AB (ref 98–111)
CO2: 23 mmol/L (ref 22–32)
Calcium: 9.3 mg/dL (ref 8.9–10.3)
Creatinine, Ser: 1.58 mg/dL — ABNORMAL HIGH (ref 0.44–1.00)
GFR calc non Af Amer: 30 mL/min — ABNORMAL LOW (ref >60)
GFR, EST AFRICAN AMERICAN: 35 mL/min — AB (ref >60)
Glucose, Bld: 72 mg/dL (ref 70–99)
POTASSIUM: 4.3 mmol/L (ref 3.5–5.1)
SODIUM: 141 mmol/L (ref 135–145)

## 2017-12-30 MED ORDER — IRON SUCROSE 20 MG/ML IV SOLN
200.0000 mg | Freq: Once | INTRAVENOUS | Status: AC
  Administered 2017-12-30: 200 mg via INTRAVENOUS
  Filled 2017-12-30: qty 10

## 2017-12-30 MED ORDER — SODIUM CHLORIDE 0.9 % IV SOLN
Freq: Once | INTRAVENOUS | Status: AC
  Administered 2017-12-30: 15:00:00 via INTRAVENOUS
  Filled 2017-12-30: qty 250

## 2017-12-30 MED ORDER — SODIUM CHLORIDE 0.9 % IV SOLN: 200.0000 mg | Freq: Once | INTRAVENOUS | Status: DC

## 2017-12-30 NOTE — Telephone Encounter (Signed)
Please inform patient that she will need to get a CT scan of the chest in approximately 4 months prior to next visit; please schedule.  Thank you
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
## 2017-12-30 NOTE — Assessment & Plan Note (Addendum)
#    Right lung adenocarcinoma stage IB [ positive for  Visceral pleural invasion/ size 2.3 cm] Status post lobectomy. CT scan-Aug 27, 2017 no evidence of recurrence; clinically stable.   #Right middle lobe nodule 5 mm-continue monitor with imaging.  Will order CT scan prior to next visit.  # Signet ring adenocarcinoma- gastric incisura; STAGE I; status post partial gastrectomy.  Clinically stable.  #Moderate anemia hemoglobin 10-malabsorption/CKD iron deficiency-IV iron improved.  Proceed with IV on today.  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  Gust the multiple treatment options of ITP including but not limited to steroids/infusions like IVIG Rituxan and also thrombopoietin estimating agents like Promacta.  Also discussed at length that this should not hold any of her critical cardiac surgeries antiplatelet therapy if needed.  # CAD s/p stents [[place]] on plavix /aspirin.  # CKD- Stage III-creatinine creatinine 1.5.  Reviewed ultrasound.  No hydronephrosis.  # follow up in 4 months/cbc/possible Venofer.   Addendum: Would recommend a CT scan noncontrast prior to next visit.  Patient will be informed  Cc: Dr.McAlhany

## 2017-12-31 NOTE — Telephone Encounter (Signed)
Scan scheduled

## 2018-01-20 ENCOUNTER — Other Ambulatory Visit: Payer: Self-pay

## 2018-01-20 ENCOUNTER — Ambulatory Visit (HOSPITAL_COMMUNITY): Payer: Medicare Other | Attending: Cardiology

## 2018-01-20 DIAGNOSIS — I350 Nonrheumatic aortic (valve) stenosis: Secondary | ICD-10-CM | POA: Insufficient documentation
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
## 2018-01-24 ENCOUNTER — Encounter: Payer: Self-pay | Admitting: Cardiovascular Disease

## 2018-01-24 ENCOUNTER — Ambulatory Visit: Payer: Medicare Other | Admitting: Cardiovascular Disease

## 2018-01-24 VITALS — BP 130/52 | HR 65 | Ht 61.0 in | Wt 133.8 lb

## 2018-01-24 DIAGNOSIS — I350 Nonrheumatic aortic (valve) stenosis: Secondary | ICD-10-CM | POA: Diagnosis not present

## 2018-01-24 LAB — BASIC METABOLIC PANEL
BUN / CREAT RATIO: 18 (ref 12–28)
BUN: 24 mg/dL (ref 8–27)
CO2: 22 mmol/L (ref 20–29)
CREATININE: 1.37 mg/dL — AB (ref 0.57–1.00)
Calcium: 9.2 mg/dL (ref 8.7–10.3)
Chloride: 107 mmol/L — ABNORMAL HIGH (ref 96–106)
GFR, EST AFRICAN AMERICAN: 42 mL/min/{1.73_m2} — AB (ref >59)
GFR, EST NON AFRICAN AMERICAN: 36 mL/min/{1.73_m2} — AB (ref >59)
Glucose: 95 mg/dL (ref 65–99)
Potassium: 4.1 mmol/L (ref 3.5–5.2)
SODIUM: 144 mmol/L (ref 134–144)

## 2018-01-24 LAB — CBC
Hematocrit: 29.8 % — ABNORMAL LOW (ref 34.0–46.6)
Hemoglobin: 10.7 g/dL — ABNORMAL LOW (ref 11.1–15.9)
MCH: 31.8 pg (ref 26.6–33.0)
MCHC: 35.9 g/dL — ABNORMAL HIGH (ref 31.5–35.7)
MCV: 89 fL (ref 79–97)
Platelets: 121 10*3/uL — ABNORMAL LOW (ref 150–450)
RBC: 3.36 x10E6/uL — ABNORMAL LOW (ref 3.77–5.28)
RDW: 12.9 % (ref 12.3–15.4)
WBC: 4.8 10*3/uL (ref 3.4–10.8)

## 2018-01-24 MED ORDER — PANTOPRAZOLE SODIUM 40 MG PO TBEC: 40.0000 mg | DELAYED_RELEASE_TABLET | Freq: Every day | ORAL | 3 refills | Status: DC

## 2018-01-24 NOTE — Patient Instructions (Signed)
Medication Instructions:  Your physician recommends that you continue on your current medications as directed. Please refer to the Current Medication list given to you today.  If you need a refill on your cardiac medications before your next appointment, please call your pharmacy.   Lab work: Lab work to be done today--BMP and CBC If you have labs (blood work) drawn today and your tests are completely normal, you will receive your results only by: Marland Kitchen MyChart Message (if you have MyChart) OR . A paper copy in the mail If you have any lab test that is abnormal or we need to change your treatment, we will call you to review the results.  Testing/Procedures: none  Follow-Up: Weyman Rodney will contact you to arrange testing

## 2018-01-27 ENCOUNTER — Other Ambulatory Visit: Payer: Self-pay

## 2018-01-27 DIAGNOSIS — I350 Nonrheumatic aortic (valve) stenosis: Secondary | ICD-10-CM

## 2018-01-27 DIAGNOSIS — N289 Disorder of kidney and ureter, unspecified: Secondary | ICD-10-CM

## 2018-01-29 ENCOUNTER — Ambulatory Visit: Payer: Medicare Other | Admitting: Cardiovascular Disease

## 2018-01-29 ENCOUNTER — Other Ambulatory Visit (HOSPITAL_COMMUNITY): Payer: Medicare Other

## 2018-02-10 ENCOUNTER — Ambulatory Visit (HOSPITAL_COMMUNITY)
Admission: RE | Admit: 2018-02-10 | Discharge: 2018-02-10 | Disposition: A | Discharge status: Home / Self Care | Payer: Medicare Other | Source: Ambulatory Visit | Attending: Physician Assistant | Admitting: Physician Assistant

## 2018-02-10 ENCOUNTER — Ambulatory Visit (HOSPITAL_COMMUNITY): Admission: RE | Admit: 2018-02-10 | Payer: Medicare Other | Source: Ambulatory Visit

## 2018-02-10 ENCOUNTER — Encounter (HOSPITAL_COMMUNITY): Payer: Self-pay

## 2018-02-10 DIAGNOSIS — R590 Localized enlarged lymph nodes: Secondary | ICD-10-CM | POA: Diagnosis not present

## 2018-02-10 DIAGNOSIS — R918 Other nonspecific abnormal finding of lung field: Secondary | ICD-10-CM | POA: Diagnosis not present

## 2018-02-10 DIAGNOSIS — Z902 Acquired absence of lung [part of]: Secondary | ICD-10-CM | POA: Insufficient documentation

## 2018-02-10 DIAGNOSIS — I350 Nonrheumatic aortic (valve) stenosis: Secondary | ICD-10-CM

## 2018-02-10 DIAGNOSIS — J984 Other disorders of lung: Secondary | ICD-10-CM | POA: Diagnosis not present

## 2018-02-10 DIAGNOSIS — J90 Pleural effusion, not elsewhere classified: Secondary | ICD-10-CM | POA: Insufficient documentation

## 2018-02-10 DIAGNOSIS — I700 Atherosclerosis of aorta: Secondary | ICD-10-CM | POA: Insufficient documentation

## 2018-02-10 DIAGNOSIS — N289 Disorder of kidney and ureter, unspecified: Secondary | ICD-10-CM

## 2018-02-10 LAB — PULMONARY FUNCTION TEST
DL/VA % pred: 145 %
DL/VA: 6.38 ml/min/mmHg/L
DLCO COR: 20.26 ml/min/mmHg
DLCO UNC % PRED: 46 %
DLCO UNC: 9.49 ml/min/mmHg
DLCO cor % pred: 100 %
FEF 25-75 PRE: 1.1 L/s
FEF 25-75 Post: 1.83 L/sec
FEF2575-%CHANGE-POST: 66 %
FEF2575-%PRED-PRE: 88 %
FEF2575-%Pred-Post: 147 %
FEV1-%Change-Post: 16 %
FEV1-%PRED-POST: 106 %
FEV1-%PRED-PRE: 90 %
FEV1-POST: 1.76 L
FEV1-Pre: 1.51 L
FEV1FVC-%Change-Post: 2 %
FEV1FVC-%Pred-Pre: 97 %
FEV6-%CHANGE-POST: 12 %
FEV6-%PRED-POST: 111 %
FEV6-%Pred-Pre: 99 %
FEV6-POST: 2.37 L
FEV6-PRE: 2.1 L
FEV6FVC-%PRED-POST: 106 %
FEV6FVC-%PRED-PRE: 106 %
FVC-%Change-Post: 13 %
FVC-%Pred-Post: 105 %
FVC-%Pred-Pre: 93 %
FVC-Post: 2.38 L
FVC-Pre: 2.1 L
POST FEV1/FVC RATIO: 74 %
POST FEV6/FVC RATIO: 100 %
PRE FEV6/FVC RATIO: 100 %
Pre FEV1/FVC ratio: 72 %
RV % pred: 151 %
RV: 3.39 L
TLC % PRED: 117 %
TLC: 5.42 L

## 2018-02-10 MED ORDER — ALBUTEROL SULFATE (2.5 MG/3ML) 0.083% IN NEBU
2.5000 mg | INHALATION_SOLUTION | Freq: Once | RESPIRATORY_TRACT | Status: AC
  Administered 2018-02-10: 2.5 mg via RESPIRATORY_TRACT

## 2018-02-10 MED ORDER — IOPAMIDOL (ISOVUE-370) INJECTION 76%
100.0000 mL | Freq: Once | INTRAVENOUS | Status: AC | PRN
  Administered 2018-02-10: 100 mL via INTRAVENOUS

## 2018-02-10 MED ORDER — SODIUM BICARBONATE 8.4 % IV SOLN
INTRAVENOUS | Status: AC
  Administered 2018-02-10: 14:00:00 via INTRAVENOUS
  Filled 2018-02-10: qty 500

## 2018-02-10 MED ORDER — SODIUM BICARBONATE BOLUS VIA INFUSION
INTRAVENOUS | Status: AC
  Administered 2018-02-10: 180 meq via INTRAVENOUS
  Filled 2018-02-10: qty 1

## 2018-02-13 ENCOUNTER — Ambulatory Visit: Payer: Medicare Other | Admitting: Physical Therapy

## 2018-02-13 ENCOUNTER — Encounter: Payer: Medicare Other | Admitting: Surgery

## 2018-02-14 ENCOUNTER — Ambulatory Visit (INDEPENDENT_AMBULATORY_CARE_PROVIDER_SITE_OTHER): Payer: Medicare Other | Admitting: Vascular Surgery

## 2018-02-14 ENCOUNTER — Other Ambulatory Visit: Payer: Self-pay

## 2018-02-14 ENCOUNTER — Institutional Professional Consult (permissible substitution): Payer: Medicare Other | Admitting: Surgery

## 2018-02-14 ENCOUNTER — Encounter (INDEPENDENT_AMBULATORY_CARE_PROVIDER_SITE_OTHER): Payer: Self-pay | Admitting: Vascular Surgery

## 2018-02-14 ENCOUNTER — Ambulatory Visit (INDEPENDENT_AMBULATORY_CARE_PROVIDER_SITE_OTHER): Payer: Medicare Other

## 2018-02-14 ENCOUNTER — Encounter (INDEPENDENT_AMBULATORY_CARE_PROVIDER_SITE_OTHER): Payer: Medicare Other

## 2018-02-14 ENCOUNTER — Encounter: Payer: Self-pay | Admitting: Surgery

## 2018-02-14 VITALS — BP 172/62 | HR 65 | Resp 18 | Ht 61.0 in | Wt 133.0 lb

## 2018-02-14 VITALS — BP 162/64 | HR 60 | Resp 15 | Ht 61.0 in | Wt 133.0 lb

## 2018-02-14 DIAGNOSIS — I350 Nonrheumatic aortic (valve) stenosis: Secondary | ICD-10-CM | POA: Diagnosis not present

## 2018-02-14 DIAGNOSIS — I6523 Occlusion and stenosis of bilateral carotid arteries: Secondary | ICD-10-CM

## 2018-02-14 DIAGNOSIS — E785 Hyperlipidemia, unspecified: Secondary | ICD-10-CM

## 2018-02-14 DIAGNOSIS — I10 Essential (primary) hypertension: Secondary | ICD-10-CM | POA: Diagnosis not present

## 2018-02-14 DIAGNOSIS — C3431 Malignant neoplasm of lower lobe, right bronchus or lung: Secondary | ICD-10-CM | POA: Diagnosis not present

## 2018-02-14 DIAGNOSIS — E119 Type 2 diabetes mellitus without complications: Secondary | ICD-10-CM | POA: Diagnosis not present

## 2018-02-14 NOTE — Progress Notes (Signed)
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 duplex today reveals stable 1 to 39% carotid artery stenosis bilaterally.  No change from previous study.  Continue current medical regimen including aspirin and Plavix.  Recheck in 1 year    Leotis Pain, MD  02/14/2018 1:25 PM    This note was created with Dragon medical transcription system.  Any errors from dictation are purely unintentional

## 2018-02-14 NOTE — Assessment & Plan Note (Signed)
Carotid duplex today reveals stable 1 to 39% carotid artery stenosis bilaterally.  No change from previous study.  Continue current medical regimen including aspirin and Plavix.  Recheck in 1 year

## 2018-02-16 ENCOUNTER — Encounter: Payer: Self-pay | Admitting: Surgery

## 2018-02-16 NOTE — Progress Notes (Signed)
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 week. Peak to peak gradient today by cath 17 mmHg. I reviewed her echo images with the TAVR team. We will hold off on TAVR right now. She will likely need TAVR within the next 6-18 months.   Recommend uninterrupted dual antiplatelet therapy with Aspirin 81mg  daily for a minimum of 12 months (ACS - Class I recommendation). (Plavix in addition to ASA).   Indications   Unstable angina (HCC) [I20.0 (ICD-10-CM)]  Procedural Details/Technique   Technical Details Indication: 82 yo female with history of AS, severe CAD.    Procedure: The risks, benefits, complications, treatment options, and expected outcomes were discussed with the patient. The patient and/or family concurred with the proposed plan, giving informed consent. The patient was brought to the cath lab after IV hydration was given. The patient was further sedated with Versed and Fentanyl. The right wrist was prepped and draped in a sterile fashion. 1% lidocaine was used for local anesthesia. Using the modified Seldinger access technique, a 6 French sheath was placed in the right radial artery. 3 mg Verapamil was given through the sheath. 9000 units IV heparin was given.   PCI Note:   Lesion #1: (mid Circumflex): ACT over 300. I engaged the left main with a XB 3.0 guiding catheter. I then passed a Viper wire down the Circumflex. I made 5 passes with the CSI Diamondback orbital atherectomy device at low speed and then two passes at high speed focusing on the mid Circumflex. I then dilated the lesion with a 2.5 x 15 mm balloon  x 1. A 3.0 x 20 mm Synergy DES was deployed in the mid Circumflex. The stent was post-dilated with a 3.0 x 15 mm [place] balloon x 1. The stenosis was taken from 95% down to 0%.   Lesion #2: (distal RCA): I engaged the RCA with an AR1 guiding catheter. I passed a Cougar IC wire down the RCA. I then dilated the lesion with a 2.0 x 10 mm balloon x 1. I then deployed a 3.0 x 16 mm Synergy DES in the distal RCA. The stent was post-dilated with a 3.25 x 12 mm [place] balloon x 1. The stenosis was taken from 90% down to 0%.   The sheath was removed from the right radial artery and a Terumo hemostasis band was applied at the arteriotomy site on the right wrist.     Estimated blood loss <50 mL.  During this procedure the patient was administered the following to achieve and maintain moderate conscious sedation: Versed 1 mg, Fentanyl 25 mcg, while the patient's heart rate, blood pressure, and oxygen saturation were continuously monitored. The period of  conscious sedation was 92 minutes, of which I was present face-to-face 100% of this time.  Complications   Complications documented before study signed (10/15/2017 2:27 PM EDT)      Log Level Complications   None Documented by Burnell Blanks, MD 10/15/2017 2:23 PM EDT  Time Range: Intraprocedure    Coronary Findings   Diagnostic  Dominance: Right  Left Anterior Descending  Prox LAD lesion 70% stenosed  Prox LAD lesion is 70% stenosed.  Left Circumflex  Vessel is large.  Prox Cx to Mid Cx lesion 95% stenosed  Prox Cx to Mid Cx lesion is 95% stenosed. The lesion is calcified.  First Obtuse Marginal Branch  Vessel is moderate in size.  Right Coronary Artery  Vessel is large.  Prox RCA to Mid RCA lesion 50% stenosed  Prox RCA to Mid RCA lesion is 50% stenosed.  Dist RCA lesion 90% stenosed  Dist RCA lesion is 90% stenosed.  Intervention   Prox Cx to Mid Cx lesion  Stent  Pre-stent angioplasty was performed using a BALLOON SAPPHIRE 2.5X15. A drug-eluting stent was successfully placed using a STENT SYNERGY DES 3X20. Post-stent angioplasty was performed using a BALLOON SAPPHIRE [place] 3.0X15.  Post-Intervention Lesion Assessment  The intervention was successful. Pre-interventional TIMI flow is 3. Post-intervention TIMI flow is 3. No complications occurred at this lesion.  There is a 0% residual stenosis post intervention.  Dist RCA lesion  Stent  Pre-stent angioplasty was performed using a BALLOON SAPPHIRE 2.0X10. A drug-eluting stent was successfully placed using a STENT SYNERGY DES 3X16. Post-stent angioplasty was performed using a BALLOON SAPPHIRE [place] 3.25X12.  Post-Intervention Lesion Assessment  The intervention was successful. Pre-interventional TIMI flow is 3. Post-intervention TIMI flow is 3. No complications occurred at this lesion.  There is a 0% residual stenosis post intervention.  Coronary Diagrams   Diagnostic Diagram       Post-Intervention Diagram        Implants    Permanent Stent  Stent Synergy Des 3x20 - [id number] - Implanted    Inventory item: Melissa Noon DES 3X20 Model/Cat number: [id number]  Manufacturer: [employer] Lot number: [id number]  Device identifier: [id number] Device identifier type: GS1  GUDID Information   Request status Successful    Brand name: SYNERGY Version/Model: [id number]  Company name: [employer] MRI safety info as of 10/15/17: MR Conditional  Contains dry or  latex rubber: No    GMDN P.T. name: Drug-eluting coronary artery stent, bioabsorbable-polymer-coated    As of 10/15/2017   Status: Implanted      Stent Synergy Des 3x16 - [id number] - Implanted    Inventory item: STENT SYNERGY DES 3X16 Model/Cat number: [id number]  Manufacturer: Wilkie Aye Lot number: [id number]  Device identifier: [id number] Device identifier type: GS1  GUDID Information   Request status Successful    Brand name: SYNERGY Version/Model: [id number]  Company name: [employer] MRI safety info as of 10/15/17: MR Conditional  Contains dry or latex rubber: No    GMDN P.T. name: Drug-eluting coronary artery stent, bioabsorbable-polymer-coated    As of 10/15/2017   Status: Implanted      MERGE Images   Show images for CARDIAC CATHETERIZATION   Link to Procedure Log   Procedure Log    Hemo Data    Most Recent Value  Aortic Mean Gradient 23.27 mmHg  Aortic Peak Gradient 17 mmHg  AO Systolic Pressure 102 mmHg  AO Diastolic Pressure 60 mmHg  AO Mean 585 mmHg  LV Systolic Pressure 277 mmHg  LV Diastolic Pressure 3 mmHg  LV EDP 18 mmHg  AOp Systolic Pressure 824 mmHg  AOp Diastolic Pressure 60 mmHg  AOp Mean Pressure 235 mmHg  LVp Systolic Pressure 361 mmHg  LVp Diastolic Pressure 2 mmHg  LVp EDP Pressure 19 mmHg    ADDENDUM REPORT: 02/11/2018 15:25  CLINICAL DATA:  82 year old female with severe aortic stenosis being evaluated for a TAVR  procedure.  EXAM: Cardiac TAVR CT  TECHNIQUE: The patient was scanned on a [employer]. A 120 kV retrospective scan was triggered in the descending thoracic aorta at 111 HU's. Gantry rotation speed was 250 msecs and collimation was .6 mm. No beta blockade or nitro were given. The 3D data set was reconstructed in 5% intervals of the R-R cycle. Systolic and diastolic phases were analyzed on a dedicated work station using MPR, MIP and VRT modes. The patient received 80 cc of contrast.  FINDINGS: Aortic Valve: Trileaflet, severely thickened and calcified aortic valve with severely restricted leaflets opening and no calcifications extending into the LVOT.  Aorta: Normal size with moderate diffuse calcifications and atheroma in the aortic arch and descending thoracic aorta, minimal in the ascending aorta. No dissection.  Sinotubular Junction: 22 x 21 mm  Ascending Thoracic Aorta: 30 x 29 mm  Aortic Arch: 25 x 22 mm  Descending Thoracic Aorta: 19 x 19 mm  Sinus of Valsalva Measurements:  Non-coronary: 25 mm  Right -coronary: 25 mm  Left -coronary: 26 mm  Sinus of Valsalva Height:  Right -coronary: 17 mm  Left -coronary: 19 mm  Coronary Artery Height above Annulus:  Left Main: 14 mm  Right Coronary: 13 mm  Virtual Basal Annulus Measurements:  Maximum/Minimum Diameter: 21.7 x 17.3  mm  Mean Diameter: 19.4 mm  Perimeter: 62.5  Mm  Area: 297 mm2  Optimum Fluoroscopic Angle for Delivery: LAO 2 CAU 1  IMPRESSION: 1. Trileaflet, severely thickened and calcified aortic valve with severely restricted leaflets opening and no calcifications extending into the LVOT. Annular measurements suitable for delivery of a 20 mm Edwards-SAPIEN 3 valve or a 23 mm Medtronic CoreValve Evolut R Valve.  2. Sufficient coronary to annulus distance.  3. Optimum Fluoroscopic Angle for Delivery: LAO 2 CAU 1.  4. No thrombus in the left atrial  appendage.  5.  Posterior mitral annular calcifications.   Electronically Signed   By: Houston Siren  Nelson   On: 02/11/2018 15:25   CLINICAL DATA:  Severe symptomatic aortic stenosis. Pre-TAVR evaluation. History of right lower lobe lung adenocarcinoma status post right lower lobectomy 05/09/2015. History of gastric adenocarcinoma status post partial gastrectomy in April 2018.  EXAM: CT ANGIOGRAPHY CHEST, ABDOMEN AND PELVIS  TECHNIQUE: Multidetector CT imaging through the chest, abdomen and pelvis was performed using the standard protocol during bolus administration of intravenous contrast. Multiplanar reconstructed images and MIPs were obtained and reviewed to evaluate the vascular anatomy.  CONTRAST:  157mL ISOVUE-370 IOPAMIDOL (ISOVUE-370) INJECTION 76%  COMPARISON:  08/27/2017 chest CT.  06/19/2016 CT abdomen/pelvis.  FINDINGS: CTA CHEST FINDINGS  Cardiovascular: Mild cardiomegaly. No significant pericardial effusion/thickening. Severe thickening and calcification of the aortic valve. Three-vessel coronary atherosclerosis. Atherosclerotic nonaneurysmal thoracic aorta. Normal caliber pulmonary arteries. No central pulmonary emboli.  Mediastinum/Nodes: Stable bilateral thyroid lobe nodules, largest 1.1 cm on the right. Unremarkable esophagus. No pathologically enlarged axillary, mediastinal or hilar lymph nodes. Top-normal 0.9 cm right hilar node (series 16/image 35).  Lungs/Pleura: Status post right lower lobectomy. New small dependent right pleural effusion with mild irregular posterior right pleural thickening. Right lateral basilar 1.5 x 0.8 cm pleural nodule (series 16/image 55), increased from 0.8 x 0.4 cm on 08/27/2017 chest CT. No left pleural effusion. No pneumothorax. Irregular mild nodular thickening along the minor fissure is increased with dominant 7 mm nodule (series 17/image 45, previously 5 mm. Basilar left lower lobe 6 mm solid pulmonary  nodule (series 17/image 66) is stable. No acute consolidative airspace disease or additional new significant pulmonary nodules.  Musculoskeletal: No aggressive appearing focal osseous lesions. Mild thoracic spondylosis.  CTA ABDOMEN AND PELVIS FINDINGS  Hepatobiliary: Normal liver with no liver mass. Normal gallbladder with no radiopaque cholelithiasis. No biliary ductal dilatation.  Pancreas: Normal, with no mass or duct dilation.  Spleen: Normal size. No mass.  Adrenals/Urinary Tract: Normal adrenals. Nonobstructing 13 mm lower left renal stone. No hydronephrosis. No contour deforming renal masses. Normal bladder.  Stomach/Bowel: Small hiatal hernia. Status post partial distal gastrectomy with intact appearing distal gastric anastomosis. No discrete mass or definite wall thickening in the nondistended remnant stomach. Normal caliber small bowel with no small bowel wall thickening. Appendectomy. Normal large bowel with no diverticulosis, large bowel wall thickening or pericolonic fat stranding.  Vascular/Lymphatic: Atherosclerotic nonaneurysmal abdominal aorta. Patent portal, splenic and renal veins. No pathologically enlarged lymph nodes in the abdomen or pelvis.  Reproductive: Status post hysterectomy, with no abnormal findings at the vaginal cuff. No adnexal mass.  Other: No pneumoperitoneum, ascites or focal fluid collection. A few subcentimeter soft tissue nodules in the central upper mesentery measuring up to 0.8 cm (series 16/image 95), possibly stable from the prior preoperative CT abdomen study.  Musculoskeletal: No aggressive appearing focal osseous lesions. Stable sclerotic lesions in the left hemipelvis characterized as benign bone islands on 04/17/2016 PET-CT. Marked lumbar spondylosis.  VASCULAR MEASUREMENTS PERTINENT TO TAVR:  AORTA:  Minimal Aortic Diameter-10.6 x 10.6 mm (infrarenal abdominal aorta on series 16/image 107)  Severity of  Aortic Calcification-moderate to severe  RIGHT PELVIS:  Right Common Iliac Artery -  Minimal Diameter-8.5 x 6.8 mm  Tortuosity-mild  Calcification-moderate  Right External Iliac Artery -  Minimal Diameter-6.4 x 5.9 mm  Tortuosity-mild-to-moderate  Calcification-minimal  Right Common Femoral Artery -  Minimal Diameter-6.3 x 6.2 mm  Tortuosity-mild  Calcification-minimal  LEFT PELVIS:  Left Common Iliac Artery -  Minimal Diameter-7.8 x 7.1 mm  Tortuosity-mild  Calcification-moderate  Left External Iliac Artery -  Minimal Diameter-6.8 x 6.7 mm  Tortuosity-moderate  Calcification-none  Left Common Femoral Artery -  Minimal Diameter-6.5 x 6.4 mm  Tortuosity-mild  Calcification-moderate  Review of the MIP images confirms the above findings.  IMPRESSION: 1. Vascular findings and measurements pertinent to potential TAVR procedure, as detailed above. 2. Severe thickening and calcification of the aortic valve, compatible with the provided history of severe symptomatic aortic stenosis. 3. New small dependent right pleural effusion. Increased irregular right pleural/fissural thickening and nodularity with dominant 1.5 cm peripheral basilar right pleural nodule. Findings are suspicious for recurrent metastatic disease in the right pleural space. 4. No definite findings of recurrent gastric tumor or metastatic disease in the abdomen and pelvis. Subcentimeter nodularity in the upper mesentery warrants continued attention on future follow-up CT abdomen/pelvis studies. 5. Aortic Atherosclerosis (ICD10-I70.0). Additional chronic findings as detailed.  STS Risk Calculator  Risk of Mortality: 9.09%  Renal Failure: 8.823%  Permanent Stroke: 3.480%  Prolonged Ventilation: 19.169%  DSW Infection: 0.143%  Reoperation:6.773%  Morbidity or Mortality: 29.384%  Short Length of Stay: 13.132%  Long Length of Stay: 16.496%   Electronically Signed   By: Ilona Sorrel M.D.   On: 02/10/2018 15:29    Impression:  This 82 year old woman has stage D, severe, symptomatic aortic stenosis with New York Heart Association class II symptoms of exertional fatigue and shortness of breath consistent with chronic diastolic congestive heart failure.  She underwent PCI with drug-eluting stents in July 2019 and had significant improvement in her chest discomfort.  She still has occasional episodes of chest pressure with exertion.  I have personally reviewed her 2D echocardiogram, cardiac catheterization, and CTA studies.  Her echocardiogram shows a trileaflet severely thickened and calcified aortic valve with restricted mobility.  The mean gradient is 54 mmHg consistent with severe aortic stenosis.  There is moderate aortic insufficiency.  Her valve has significantly worsened over the past few months.  Left ventricular ejection fraction is normal.  Cardiac catheterization showed severe three-vessel coronary disease and she has undergone successful stenting of the right coronary artery and left circumflex coronary artery.  She remains on aspirin and Plavix.  I agree that transcatheter aortic valve replacement would be the best option for treating her severe aortic stenosis.  Her gated cardiac CTA shows anatomy suitable for transcatheter aortic valve replacement although her aortic annulus is relatively small and only suitable for any 20 mm Sapien 3 valve or a 23 mm Medtronic Evolut R valve.  Her abdominal and pelvic CTA shows anatomy suitable for transfemoral insertion.  Her chest CTA shows a new small dependent right pleural effusion as well as increased irregular right pleural and fissural thickening and nodularity with a dominant 1.5 cm peripheral basilar right pleural nodule suspicious for recurrent metastatic disease in the right pleural space.  These findings were not present on her CT scan of the chest in May 2019.  I think it would be  worthwhile obtaining a PET scan prior to proceeding with transcatheter aortic valve replacement to assess for recurrent metastatic cancer.  Her lung cancer was resected completely with negative nodes but did have visceral pleural invasion.  I reviewed the CT images with her and answered all of her questions.  She is in agreement with proceeding with PET scan prior to TAVR.  After that has been completed we will make a decision about proceeding with TAVR.  The patient was counseled at length regarding treatment alternatives for management of severe symptomatic aortic stenosis. The risks and benefits of  surgical intervention has been discussed in detail. Long-term prognosis with medical therapy was discussed. Alternative approaches such as conventional surgical aortic valve replacement, transcatheter aortic valve replacement, and palliative medical therapy were compared and contrasted at length. This discussion was placed in the context of the patient's own specific clinical presentation and past medical history. All of her questions have been addressed.   A discussion was held regarding what types of management strategies would be attempted intraoperatively in the event of life-threatening complications, including whether or not the patient would be considered a candidate for the use of cardiopulmonary bypass and/or conversion to open sternotomy for attempted surgical intervention. The patient has been advised of a variety of complications that might develop including but not limited to risks of death, stroke, paravalvular leak, aortic dissection or other major vascular complications, aortic annulus rupture, device embolization, cardiac rupture or perforation, mitral regurgitation, acute myocardial infarction, arrhythmia, heart block or bradycardia requiring permanent pacemaker placement, congestive heart failure, respiratory failure, renal failure, pneumonia, infection, other late complications related to  structural valve deterioration or migration, or other complications that might ultimately cause a temporary or permanent loss of functional independence or other long term morbidity. The patient provides full informed consent for the procedure as described and all questions were answered.     Plan:  The patient will be scheduled for a PET scan as soon as possible.  We will review the results of that and then decide about proceeding with transfemoral transcatheter aortic valve replacement.  I spent 60 minutes performing this consultation and > 50% of this time was spent face to face counseling and coordinating the care of this patient's severe symptomatic aortic stenosis.  Gaye Pollack, MD 02/14/2018

## 2018-02-17 ENCOUNTER — Telehealth: Payer: Self-pay | Admitting: Internal Medicine

## 2018-02-17 ENCOUNTER — Other Ambulatory Visit: Payer: Self-pay

## 2018-02-17 ENCOUNTER — Other Ambulatory Visit: Payer: Self-pay | Admitting: Internal Medicine

## 2018-02-17 DIAGNOSIS — Z85118 Personal history of other malignant neoplasm of bronchus and lung: Secondary | ICD-10-CM

## 2018-02-17 DIAGNOSIS — R918 Other nonspecific abnormal finding of lung field: Secondary | ICD-10-CM

## 2018-02-17 DIAGNOSIS — J90 Pleural effusion, not elsewhere classified: Secondary | ICD-10-CM

## 2018-02-17 DIAGNOSIS — C3431 Malignant neoplasm of lower lobe, right bronchus or lung: Secondary | ICD-10-CM

## 2018-02-17 DIAGNOSIS — I350 Nonrheumatic aortic (valve) stenosis: Secondary | ICD-10-CM

## 2018-02-17 NOTE — Telephone Encounter (Signed)
I spoke with Dr Rogue Bussing and made him aware that I have scheduled the pt for PET scan on Thursday, 02/20/18 at 11:30 AM. This will be perfomed at [facility] and the pt will be advised to arrive at 11:00 AM for check-in and NPO 6 hours prior. Dr Rogue Bussing will follow-up with the pt on Friday, 02/21/2018.

## 2018-02-17 NOTE — Telephone Encounter (Signed)
Thanks [name].   Stacy Cook

## 2018-02-17 NOTE — Progress Notes (Signed)
Spoke to collete; keep PETscan as scheduled; follow up with me 1 day later.

## 2018-02-17 NOTE — Telephone Encounter (Signed)
Spoke to patient reviewed the results of the CT scan; evaluation from cardiology.  I would recommend a PET scan for further evaluation-given the abnormal CT scan especially given the upcoming surgery/ TAVR with cardiology.  [name]/ [name]- I will order PET scan ASAP; please have the patient follow-up with me 1 to 2 days post scan/labs CBC BMP.   I will copy this message to Dr.Bartle;  Dr.Bartle- please feel free to reach me at [phone]/ to discuss about the patient when you get a chance.   I also, left message for Stacy Cook to call me back to discuss the above.

## 2018-02-17 NOTE — Telephone Encounter (Signed)
Spoke to cardiology [name]-PET scan has been ordered through cardiology and scheduled for this Thursday; 14.   I will cancel my PET scan ordered/hold off scheduling.  Patient will still follow-up with me on the 15th/labs-CBC BMP.

## 2018-02-20 ENCOUNTER — Inpatient Hospital Stay: Payer: Medicare Other | Attending: Internal Medicine

## 2018-02-20 ENCOUNTER — Encounter
Admission: RE | Admit: 2018-02-20 | Discharge: 2018-02-20 | Disposition: A | Discharge status: Home / Self Care | Payer: Medicare Other | Source: Ambulatory Visit | Attending: Internal Medicine | Admitting: Internal Medicine

## 2018-02-20 ENCOUNTER — Telehealth: Payer: Self-pay | Admitting: Internal Medicine

## 2018-02-20 DIAGNOSIS — Z7982 Long term (current) use of aspirin: Secondary | ICD-10-CM | POA: Insufficient documentation

## 2018-02-20 DIAGNOSIS — E041 Nontoxic single thyroid nodule: Secondary | ICD-10-CM | POA: Insufficient documentation

## 2018-02-20 DIAGNOSIS — N200 Calculus of kidney: Secondary | ICD-10-CM | POA: Insufficient documentation

## 2018-02-20 DIAGNOSIS — Z85118 Personal history of other malignant neoplasm of bronchus and lung: Secondary | ICD-10-CM | POA: Diagnosis present

## 2018-02-20 DIAGNOSIS — J90 Pleural effusion, not elsewhere classified: Secondary | ICD-10-CM | POA: Diagnosis present

## 2018-02-20 DIAGNOSIS — K909 Intestinal malabsorption, unspecified: Secondary | ICD-10-CM | POA: Insufficient documentation

## 2018-02-20 DIAGNOSIS — Z79899 Other long term (current) drug therapy: Secondary | ICD-10-CM | POA: Insufficient documentation

## 2018-02-20 DIAGNOSIS — N183 Chronic kidney disease, stage 3 (moderate): Secondary | ICD-10-CM | POA: Insufficient documentation

## 2018-02-20 DIAGNOSIS — K219 Gastro-esophageal reflux disease without esophagitis: Secondary | ICD-10-CM | POA: Insufficient documentation

## 2018-02-20 DIAGNOSIS — I10 Essential (primary) hypertension: Secondary | ICD-10-CM | POA: Insufficient documentation

## 2018-02-20 DIAGNOSIS — M4316 Spondylolisthesis, lumbar region: Secondary | ICD-10-CM | POA: Insufficient documentation

## 2018-02-20 DIAGNOSIS — Z8711 Personal history of peptic ulcer disease: Secondary | ICD-10-CM | POA: Insufficient documentation

## 2018-02-20 DIAGNOSIS — E785 Hyperlipidemia, unspecified: Secondary | ICD-10-CM | POA: Insufficient documentation

## 2018-02-20 DIAGNOSIS — I2510 Atherosclerotic heart disease of native coronary artery without angina pectoris: Secondary | ICD-10-CM | POA: Insufficient documentation

## 2018-02-20 DIAGNOSIS — Z85028 Personal history of other malignant neoplasm of stomach: Secondary | ICD-10-CM | POA: Insufficient documentation

## 2018-02-20 DIAGNOSIS — I350 Nonrheumatic aortic (valve) stenosis: Secondary | ICD-10-CM | POA: Insufficient documentation

## 2018-02-20 DIAGNOSIS — C3431 Malignant neoplasm of lower lobe, right bronchus or lung: Secondary | ICD-10-CM | POA: Insufficient documentation

## 2018-02-20 DIAGNOSIS — Z85828 Personal history of other malignant neoplasm of skin: Secondary | ICD-10-CM | POA: Insufficient documentation

## 2018-02-20 DIAGNOSIS — D696 Thrombocytopenia, unspecified: Secondary | ICD-10-CM | POA: Insufficient documentation

## 2018-02-20 DIAGNOSIS — D509 Iron deficiency anemia, unspecified: Secondary | ICD-10-CM | POA: Insufficient documentation

## 2018-02-20 DIAGNOSIS — E1122 Type 2 diabetes mellitus with diabetic chronic kidney disease: Secondary | ICD-10-CM | POA: Insufficient documentation

## 2018-02-20 DIAGNOSIS — I129 Hypertensive chronic kidney disease with stage 1 through stage 4 chronic kidney disease, or unspecified chronic kidney disease: Secondary | ICD-10-CM | POA: Insufficient documentation

## 2018-02-20 LAB — GLUCOSE, CAPILLARY: Glucose-Capillary: 75 mg/dL (ref 70–99)

## 2018-02-20 MED ORDER — FLUDEOXYGLUCOSE F - 18 (FDG) INJECTION
7.5400 | Freq: Once | INTRAVENOUS | Status: AC | PRN
  Administered 2018-02-20: 7.54 via INTRAVENOUS

## 2018-02-20 NOTE — Telephone Encounter (Signed)
FYI Discussed at tumor conference; awaiting final PET; recommend thora/ diagnostic. Will speak to pt after PET is available.

## 2018-02-20 NOTE — Progress Notes (Addendum)
Tumor Board Documentation  Stacy Cook was presented by Dr Rogue Bussing at our Tumor Board on 02/20/2018, which included representatives from medical oncology, radiation oncology, internal medicine, navigation, pathology, radiology, surgical, pharmacy, research, palliative care, pulmonology.  Stacy Cook currently presents as a current patient with history of the following treatments: surgical intervention(s).  Additionally, we reviewed previous medical and familial history, history of present illness, and recent lab results along with all available histopathologic and imaging studies. The tumor board considered available treatment options and made the following recommendations:   Await PET report for further determination of treatment  The following procedures/referrals were also placed: No orders of the defined types were placed in this encounter.   Clinical Trial Status: not discussed   Staging used:    National site-specific guidelines   were discussed with respect to the case.  Tumor board is a meeting of clinicians from various specialty areas who evaluate and discuss patients for whom a multidisciplinary approach is being considered. Final determinations in the plan of care are those of the provider(s). The responsibility for follow up of recommendations given during tumor board is that of the provider.   Today's extended care, comprehensive team conference, Stacy Cook was not present for the discussion and was not examined.   Multidisciplinary Tumor Board is a multidisciplinary case peer review process.  Decisions discussed in the Multidisciplinary Tumor Board reflect the opinions of the specialists present at the conference without having examined the patient.  Ultimately, treatment and diagnostic decisions rest with the primary provider(s) and the patient.  Addendum: Recommend thoracentesis for further evaluation.

## 2018-02-21 ENCOUNTER — Inpatient Hospital Stay (HOSPITAL_BASED_OUTPATIENT_CLINIC_OR_DEPARTMENT_OTHER): Payer: Medicare Other | Admitting: Internal Medicine

## 2018-02-21 ENCOUNTER — Inpatient Hospital Stay: Payer: Medicare Other

## 2018-02-21 ENCOUNTER — Telehealth: Payer: Self-pay

## 2018-02-21 ENCOUNTER — Encounter: Payer: Self-pay | Admitting: Internal Medicine

## 2018-02-21 ENCOUNTER — Other Ambulatory Visit: Payer: Self-pay | Admitting: *Deleted

## 2018-02-21 ENCOUNTER — Other Ambulatory Visit: Payer: Self-pay

## 2018-02-21 VITALS — BP 172/63 | HR 60 | Temp 98.0°F | Resp 18 | Ht 61.0 in | Wt 133.5 lb

## 2018-02-21 DIAGNOSIS — J90 Pleural effusion, not elsewhere classified: Secondary | ICD-10-CM

## 2018-02-21 DIAGNOSIS — K909 Intestinal malabsorption, unspecified: Secondary | ICD-10-CM | POA: Diagnosis not present

## 2018-02-21 DIAGNOSIS — D696 Thrombocytopenia, unspecified: Secondary | ICD-10-CM | POA: Diagnosis not present

## 2018-02-21 DIAGNOSIS — E785 Hyperlipidemia, unspecified: Secondary | ICD-10-CM

## 2018-02-21 DIAGNOSIS — I2510 Atherosclerotic heart disease of native coronary artery without angina pectoris: Secondary | ICD-10-CM

## 2018-02-21 DIAGNOSIS — I10 Essential (primary) hypertension: Secondary | ICD-10-CM | POA: Diagnosis not present

## 2018-02-21 DIAGNOSIS — I350 Nonrheumatic aortic (valve) stenosis: Secondary | ICD-10-CM

## 2018-02-21 DIAGNOSIS — E041 Nontoxic single thyroid nodule: Secondary | ICD-10-CM

## 2018-02-21 DIAGNOSIS — D509 Iron deficiency anemia, unspecified: Secondary | ICD-10-CM

## 2018-02-21 DIAGNOSIS — Z85828 Personal history of other malignant neoplasm of skin: Secondary | ICD-10-CM | POA: Diagnosis not present

## 2018-02-21 DIAGNOSIS — C160 Malignant neoplasm of cardia: Secondary | ICD-10-CM

## 2018-02-21 DIAGNOSIS — N183 Chronic kidney disease, stage 3 (moderate): Secondary | ICD-10-CM | POA: Diagnosis not present

## 2018-02-21 DIAGNOSIS — Z85028 Personal history of other malignant neoplasm of stomach: Secondary | ICD-10-CM | POA: Diagnosis not present

## 2018-02-21 DIAGNOSIS — Z79899 Other long term (current) drug therapy: Secondary | ICD-10-CM

## 2018-02-21 DIAGNOSIS — C3431 Malignant neoplasm of lower lobe, right bronchus or lung: Secondary | ICD-10-CM

## 2018-02-21 DIAGNOSIS — M4316 Spondylolisthesis, lumbar region: Secondary | ICD-10-CM | POA: Diagnosis not present

## 2018-02-21 DIAGNOSIS — E1122 Type 2 diabetes mellitus with diabetic chronic kidney disease: Secondary | ICD-10-CM

## 2018-02-21 DIAGNOSIS — K219 Gastro-esophageal reflux disease without esophagitis: Secondary | ICD-10-CM | POA: Diagnosis not present

## 2018-02-21 DIAGNOSIS — N200 Calculus of kidney: Secondary | ICD-10-CM

## 2018-02-21 DIAGNOSIS — Z7982 Long term (current) use of aspirin: Secondary | ICD-10-CM | POA: Diagnosis not present

## 2018-02-21 DIAGNOSIS — I129 Hypertensive chronic kidney disease with stage 1 through stage 4 chronic kidney disease, or unspecified chronic kidney disease: Secondary | ICD-10-CM

## 2018-02-21 DIAGNOSIS — Z8711 Personal history of peptic ulcer disease: Secondary | ICD-10-CM | POA: Diagnosis not present

## 2018-02-21 LAB — CBC WITH DIFFERENTIAL/PLATELET
ABS IMMATURE GRANULOCYTES: 0.01 10*3/uL (ref 0.00–0.07)
BASOS PCT: 0 %
Basophils Absolute: 0 10*3/uL (ref 0.0–0.1)
Eosinophils Absolute: 0.1 10*3/uL (ref 0.0–0.5)
Eosinophils Relative: 3 %
HCT: 30.3 % — ABNORMAL LOW (ref 36.0–46.0)
Hemoglobin: 10 g/dL — ABNORMAL LOW (ref 12.0–15.0)
Immature Granulocytes: 0 %
Lymphocytes Relative: 24 %
Lymphs Abs: 0.9 10*3/uL (ref 0.7–4.0)
MCH: 31.2 pg (ref 26.0–34.0)
MCHC: 33 g/dL (ref 30.0–36.0)
MCV: 94.4 fL (ref 80.0–100.0)
Monocytes Absolute: 0.2 10*3/uL (ref 0.1–1.0)
Monocytes Relative: 6 %
Neutro Abs: 2.4 10*3/uL (ref 1.7–7.7)
Neutrophils Relative %: 67 %
PLATELETS: 88 10*3/uL — AB (ref 150–400)
RBC: 3.21 MIL/uL — AB (ref 3.87–5.11)
RDW: 12.9 % (ref 11.5–15.5)
WBC: 3.6 10*3/uL — AB (ref 4.0–10.5)
nRBC: 0 % (ref 0.0–0.2)

## 2018-02-21 LAB — COMPREHENSIVE METABOLIC PANEL
ALK PHOS: 78 U/L (ref 38–126)
ALT: 10 U/L (ref 0–44)
AST: 18 U/L (ref 15–41)
Albumin: 4.1 g/dL (ref 3.5–5.0)
Anion gap: 7 (ref 5–15)
BUN: 20 mg/dL (ref 8–23)
CHLORIDE: 109 mmol/L (ref 98–111)
CO2: 26 mmol/L (ref 22–32)
Calcium: 9.1 mg/dL (ref 8.9–10.3)
Creatinine, Ser: 1.34 mg/dL — ABNORMAL HIGH (ref 0.44–1.00)
GFR calc non Af Amer: 36 mL/min — ABNORMAL LOW (ref >60)
GFR, EST AFRICAN AMERICAN: 42 mL/min — AB (ref >60)
Glucose, Bld: 99 mg/dL (ref 70–99)
Potassium: 4.4 mmol/L (ref 3.5–5.1)
SODIUM: 142 mmol/L (ref 135–145)
TOTAL PROTEIN: 6.6 g/dL (ref 6.5–8.1)
Total Bilirubin: 0.6 mg/dL (ref 0.3–1.2)

## 2018-02-21 LAB — BRAIN NATRIURETIC PEPTIDE: B NATRIURETIC PEPTIDE 5: 163 pg/mL — AB (ref 0.0–100.0)

## 2018-02-21 NOTE — Discharge Instructions (Signed)
Thoracentesis, Care After Refer to this sheet in the next few weeks. These instructions provide you with information about caring for yourself after your procedure. Your health care provider may also give you more specific instructions. Your treatment has been planned according to current medical practices, but problems sometimes occur. Call your health care provider if you have any problems or questions after your procedure. What can I expect after the procedure? After your procedure, it is common to have pain at the puncture site. Follow these instructions at home:  Take medicines only as directed by your health care provider.  You may return to your normal diet and normal activities as directed by your health care provider.  Drink enough fluid to keep your urine clear or pale yellow.  Do not take baths, swim, or use a hot tub until your health care provider approves.  Follow your health care provider's instructions about: ? Puncture site care. ? Bandage (dressing) changes and removal.  Check your puncture site every day for signs of infection. Watch for: ? Redness, swelling, or pain. ? Fluid, blood, or pus.  Keep all follow-up visits as directed by your health care provider. This is important. Contact a health care provider if:  You have redness, swelling, or pain at your puncture site.  You have fluid, blood, or pus coming from your puncture site.  You have a fever.  You have chills.  You have nausea or vomiting.  You have trouble breathing.  You develop a worsening cough. Get help right away if:  You have extreme shortness of breath.  You develop chest pain.  You faint or feel light-headed. This information is not intended to replace advice given to you by your health care provider. Make sure you discuss any questions you have with your health care provider. Document Released: 04/16/2014 Document Revised: 11/26/2015 Document Reviewed: 01/05/2014 Elsevier  Interactive Patient Education  [employer].

## 2018-02-21 NOTE — Telephone Encounter (Signed)
Called patient to let her know that her thoracentesis was scheduled to be on 02/24/2018 at the [facility] and for her to arrive at 9:30 AM. Patient understood and had no further questions.

## 2018-02-21 NOTE — Assessment & Plan Note (Addendum)
#    Right lung adenocarcinoma stage IB [ positive for  Visceral pleural invasion/ size 2.3 cm] Status post lobectomy.  November 2019 CT scan/PET- ? Recurrence based on effusion/nodularity right lower pleural-based versus scarring from surgery/CHF.  Check BNP; see discussion below.  #Clinically less likely for recurrence however given the upcoming surgery it would be very important to rule out recurrent lung cancer to the best.  #Right-sided pleural effusion-see above/malignant versus benign.  Recommend thoracentesis/fluid studies.  ASAP.  # Signet ring adenocarcinoma- gastric incisura; STAGE I; status post partial gastrectomy.  Stable  #Moderate anemia hemoglobin 10-malabsorption/CKD iron deficiency-IV iron improved.  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 Stable.   # CAD s/p stents [[place]] on plavix /aspirin.  # CKD staghorn calculus- Stage III-creatinine creatinine creatinine 1.4 stable   #Discussed with cardiology nurse Theodosia Quay.  Also reviewed the tumor conference.  # DISPOSITION: # [place] thora ASAP # cancel the CT for jan 2020. # keep appt in Jan as planned- Dr.B
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
## 2018-02-24 ENCOUNTER — Ambulatory Visit
Admission: RE | Admit: 2018-02-24 | Discharge: 2018-02-24 | Disposition: A | Discharge status: Home / Self Care | Payer: Medicare Other | Source: Ambulatory Visit | Attending: Interventional Radiology | Admitting: Interventional Radiology

## 2018-02-24 ENCOUNTER — Ambulatory Visit: Payer: Medicare Other | Admitting: Physical Therapy

## 2018-02-24 ENCOUNTER — Other Ambulatory Visit (HOSPITAL_COMMUNITY): Payer: Medicare Other

## 2018-02-24 ENCOUNTER — Ambulatory Visit
Admission: RE | Admit: 2018-02-24 | Discharge: 2018-02-24 | Disposition: A | Discharge status: Home / Self Care | Payer: Medicare Other | Source: Ambulatory Visit | Attending: Internal Medicine | Admitting: Internal Medicine

## 2018-02-24 DIAGNOSIS — C3431 Malignant neoplasm of lower lobe, right bronchus or lung: Secondary | ICD-10-CM | POA: Diagnosis present

## 2018-02-24 DIAGNOSIS — J90 Pleural effusion, not elsewhere classified: Secondary | ICD-10-CM | POA: Diagnosis not present

## 2018-02-24 DIAGNOSIS — Z9889 Other specified postprocedural states: Secondary | ICD-10-CM

## 2018-02-24 LAB — GLUCOSE, PLEURAL OR PERITONEAL FLUID: GLUCOSE FL: 87 mg/dL

## 2018-02-24 LAB — PROTEIN, PLEURAL OR PERITONEAL FLUID: Total protein, fluid: <3 g/dL

## 2018-02-24 LAB — BODY FLUID CELL COUNT WITH DIFFERENTIAL
EOS FL: 1 %
Lymphs, Fluid: 83 %
MONOCYTE-MACROPHAGE-SEROUS FLUID: 15 %
NEUTROPHIL FLUID: 1 %
Other Cells, Fluid: 0 %
Total Nucleated Cell Count, Fluid: 1155 cu mm

## 2018-02-24 LAB — LACTATE DEHYDROGENASE, PLEURAL OR PERITONEAL FLUID: LD, Fluid: 189 U/L — ABNORMAL HIGH (ref 3–23)

## 2018-02-24 NOTE — Procedures (Signed)
Interventional Radiology Procedure Note  Procedure: US guided right thoracentesis  Complications: None  Estimated Blood Loss: None  Findings: 275 mL amber colored fluid removed from right pleural space; fluid sent for labs.  Venetia Night. Kathlene Cote, M.D Pager:  [phone]

## 2018-02-25 LAB — PROTEIN, BODY FLUID (OTHER): Total Protein, Body Fluid Other: 2.8 g/dL

## 2018-02-25 LAB — PH, BODY FLUID: pH, Body Fluid: 7.8

## 2018-02-25 LAB — TRIGLYCERIDES, BODY FLUIDS: TRIGLYCERIDES FL: 11 mg/dL

## 2018-02-26 LAB — CYTOLOGY - NON PAP

## 2018-02-27 ENCOUNTER — Telehealth: Payer: Self-pay | Admitting: Internal Medicine

## 2018-02-27 NOTE — Telephone Encounter (Signed)
#   I Spoke to patient regarding the results of the lung pleural fluid cytology positive for malignancy.  Recommend bring family to discuss/treatment options prognosis. Recommend follow-up on 11/25 at 9:00. Collete please confirm with pt/ and schedule.   # left message for pathology/ will order omniseq testing on current/old pathology.   # Theodosia Quay- Given above results will likely have to hold the surgery. Pt aware of the possibility.  Please feel free to reach me at [phone] if any questions/concerns.  GB

## 2018-02-27 NOTE — Telephone Encounter (Signed)
Thank you Dr Rogue Bussing.  I will await your conversation with the pt/family on 11/25 and then I can discuss the pt's case further with the TAVR team to determine plan before officially cancelling surgery.

## 2018-02-28 LAB — BODY FLUID CULTURE: Culture: NO GROWTH

## 2018-03-03 ENCOUNTER — Inpatient Hospital Stay: Payer: Medicare Other | Admitting: Internal Medicine

## 2018-03-03 ENCOUNTER — Other Ambulatory Visit: Payer: Self-pay

## 2018-03-03 ENCOUNTER — Encounter: Payer: Self-pay | Admitting: Internal Medicine

## 2018-03-03 VITALS — BP 165/71 | HR 61 | Temp 98.3°F | Resp 18

## 2018-03-03 DIAGNOSIS — D509 Iron deficiency anemia, unspecified: Secondary | ICD-10-CM

## 2018-03-03 DIAGNOSIS — E785 Hyperlipidemia, unspecified: Secondary | ICD-10-CM

## 2018-03-03 DIAGNOSIS — K909 Intestinal malabsorption, unspecified: Secondary | ICD-10-CM

## 2018-03-03 DIAGNOSIS — Z79899 Other long term (current) drug therapy: Secondary | ICD-10-CM

## 2018-03-03 DIAGNOSIS — I129 Hypertensive chronic kidney disease with stage 1 through stage 4 chronic kidney disease, or unspecified chronic kidney disease: Secondary | ICD-10-CM | POA: Diagnosis not present

## 2018-03-03 DIAGNOSIS — E041 Nontoxic single thyroid nodule: Secondary | ICD-10-CM

## 2018-03-03 DIAGNOSIS — Z7982 Long term (current) use of aspirin: Secondary | ICD-10-CM

## 2018-03-03 DIAGNOSIS — M4316 Spondylolisthesis, lumbar region: Secondary | ICD-10-CM

## 2018-03-03 DIAGNOSIS — D696 Thrombocytopenia, unspecified: Secondary | ICD-10-CM | POA: Diagnosis not present

## 2018-03-03 DIAGNOSIS — Z8711 Personal history of peptic ulcer disease: Secondary | ICD-10-CM

## 2018-03-03 DIAGNOSIS — C3431 Malignant neoplasm of lower lobe, right bronchus or lung: Secondary | ICD-10-CM | POA: Diagnosis not present

## 2018-03-03 DIAGNOSIS — N183 Chronic kidney disease, stage 3 (moderate): Secondary | ICD-10-CM | POA: Diagnosis not present

## 2018-03-03 DIAGNOSIS — Z85828 Personal history of other malignant neoplasm of skin: Secondary | ICD-10-CM

## 2018-03-03 DIAGNOSIS — Z85028 Personal history of other malignant neoplasm of stomach: Secondary | ICD-10-CM

## 2018-03-03 DIAGNOSIS — I350 Nonrheumatic aortic (valve) stenosis: Secondary | ICD-10-CM

## 2018-03-03 DIAGNOSIS — E1122 Type 2 diabetes mellitus with diabetic chronic kidney disease: Secondary | ICD-10-CM

## 2018-03-03 DIAGNOSIS — J90 Pleural effusion, not elsewhere classified: Secondary | ICD-10-CM

## 2018-03-03 DIAGNOSIS — K219 Gastro-esophageal reflux disease without esophagitis: Secondary | ICD-10-CM

## 2018-03-03 DIAGNOSIS — I2510 Atherosclerotic heart disease of native coronary artery without angina pectoris: Secondary | ICD-10-CM

## 2018-03-03 DIAGNOSIS — N200 Calculus of kidney: Secondary | ICD-10-CM
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
## 2018-03-03 NOTE — Assessment & Plan Note (Addendum)
#  Stage IV recurrent adenocarcinoma the lung [positive on pleural fluid cytology].   #Discussed with the patient and son unfortunately poor prognosis of stage IV lung cancer; with the median life expectancy of 3 to 52 months.  Understands treatments would be palliative; and not curative.  #However patient individual options/prognosis would depend upon next generation sequencing/presence of any target mutations; response to therapy/tolerance to therapy etc.  Discussed chemotherapy/immunotherapy/targeted therapy options.  Await above NGS testing results.  Also discussed with Dr. Andre/pathologist.  # Signet ring adenocarcinoma- gastric incisura; STAGE I; status post partial gastrectomy.  Stable  #Moderate anemia hemoglobin 10-malabsorption/CKD iron deficiency-IV iron improved.  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  Stable  # CKD staghorn calculus- Stage III-creatinine creatinine creatinine 1.4 .  Stable  # CAD s/p stents [[place]] on plavix /aspirin; critical aortic stenosis-awaiting TAVR procedure-currently on hold given her oncologic issues/see above.   # I had a long discussion regarding patient's complicated situation with incurable lung cancer which gets more complicated with her needing TAVR procedure for her critical aortic stenosis.  My preference would be to proceed with systemic therapy for lung cancer/making sure she has a good response to treatment/her disease is under good control prior to proceeding with TAVR procedure.  Patient is obviously disappointed my assessment; however clearly understands the rational.  # DISPOSITION: # follow up in 2 weeks/MD- Dr.B to discuss treatment options.  # 40 minutes face-to-face with the patient discussing the above plan of care; more than 50% of time spent on prognosis/ natural history; counseling and coordination.

## 2018-03-05 ENCOUNTER — Other Ambulatory Visit: Payer: Self-pay

## 2018-03-05 ENCOUNTER — Telehealth: Payer: Self-pay

## 2018-03-05 NOTE — Telephone Encounter (Signed)
  [facility]   MULTIDISCIPLINARY HEART VALVE TEAM   I spoke with the pt and made her aware that Dr Rogue Bussing followed up with the TAVR team in regards to his evaluation.  Findings were discussed with Dr Angelena Form and Dr Cyndia Bent and the team plans to cancel TAVR on 03/18/18.  The Structural Heart Team will be available if Dr Rogue Bussing feels like TAVR is an option in the future.  Pt is aware of plan and I advised her to feel free to contact me with any additional questions or concerns.

## 2018-03-17 ENCOUNTER — Telehealth: Payer: Self-pay | Admitting: Internal Medicine

## 2018-03-17 ENCOUNTER — Inpatient Hospital Stay: Payer: Medicare Other | Admitting: Internal Medicine

## 2018-03-17 NOTE — Telephone Encounter (Signed)
Patient arrived to clinic. spoke with patient face to face. Patient gave verbal understanding the apts will be cnl today. We will notify her of these test results when available.

## 2018-03-17 NOTE — Telephone Encounter (Signed)
Tried to call pt x 2; unable to leave message. [facility] still not available. Dr.B

## 2018-03-18 ENCOUNTER — Inpatient Hospital Stay (HOSPITAL_COMMUNITY): Admission: RE | Admit: 2018-03-18 | Payer: Medicare Other | Source: Ambulatory Visit | Admitting: Cardiovascular Disease

## 2018-03-18 ENCOUNTER — Encounter (HOSPITAL_COMMUNITY): Admission: RE | Payer: Self-pay | Source: Ambulatory Visit

## 2018-03-18 SURGERY — IMPLANTATION, AORTIC VALVE, TRANSCATHETER, FEMORAL APPROACH
Anesthesia: General | Site: Chest

## 2018-03-20 ENCOUNTER — Encounter: Payer: Self-pay | Admitting: Internal Medicine

## 2018-03-21 ENCOUNTER — Encounter: Payer: Self-pay | Admitting: Internal Medicine

## 2018-03-21 ENCOUNTER — Telehealth: Payer: Self-pay | Admitting: Internal Medicine

## 2018-03-21 MED ORDER — OSIMERTINIB MESYLATE 80 MG PO TABS: 80.0000 mg | ORAL_TABLET | Freq: Every day | ORAL | 4 refills | Status: DC

## 2018-03-21 NOTE — Telephone Encounter (Signed)
FYI-  Call patient informed that she would be a candidate for targeted therapy/pills given EGFR mutation. Plan [place]/script printed.   Patient will keep appointment as planned for 12/16.

## 2018-03-24 ENCOUNTER — Encounter: Payer: Self-pay | Admitting: Internal Medicine

## 2018-03-24 ENCOUNTER — Inpatient Hospital Stay: Payer: Medicare Other | Attending: Internal Medicine | Admitting: Internal Medicine

## 2018-03-24 VITALS — BP 174/66 | HR 63 | Temp 97.2°F | Resp 16 | Wt 135.8 lb

## 2018-03-24 DIAGNOSIS — I2510 Atherosclerotic heart disease of native coronary artery without angina pectoris: Secondary | ICD-10-CM | POA: Diagnosis not present

## 2018-03-24 DIAGNOSIS — E1122 Type 2 diabetes mellitus with diabetic chronic kidney disease: Secondary | ICD-10-CM | POA: Diagnosis not present

## 2018-03-24 DIAGNOSIS — Z85020 Personal history of malignant carcinoid tumor of stomach: Secondary | ICD-10-CM | POA: Insufficient documentation

## 2018-03-24 DIAGNOSIS — K909 Intestinal malabsorption, unspecified: Secondary | ICD-10-CM | POA: Diagnosis not present

## 2018-03-24 DIAGNOSIS — E119 Type 2 diabetes mellitus without complications: Secondary | ICD-10-CM | POA: Diagnosis not present

## 2018-03-24 DIAGNOSIS — I129 Hypertensive chronic kidney disease with stage 1 through stage 4 chronic kidney disease, or unspecified chronic kidney disease: Secondary | ICD-10-CM | POA: Diagnosis not present

## 2018-03-24 DIAGNOSIS — Z8711 Personal history of peptic ulcer disease: Secondary | ICD-10-CM

## 2018-03-24 DIAGNOSIS — C3431 Malignant neoplasm of lower lobe, right bronchus or lung: Secondary | ICD-10-CM

## 2018-03-24 DIAGNOSIS — E538 Deficiency of other specified B group vitamins: Secondary | ICD-10-CM

## 2018-03-24 DIAGNOSIS — Z7982 Long term (current) use of aspirin: Secondary | ICD-10-CM

## 2018-03-24 DIAGNOSIS — Z79899 Other long term (current) drug therapy: Secondary | ICD-10-CM | POA: Insufficient documentation

## 2018-03-24 DIAGNOSIS — M129 Arthropathy, unspecified: Secondary | ICD-10-CM

## 2018-03-24 DIAGNOSIS — K219 Gastro-esophageal reflux disease without esophagitis: Secondary | ICD-10-CM | POA: Insufficient documentation

## 2018-03-24 DIAGNOSIS — N200 Calculus of kidney: Secondary | ICD-10-CM

## 2018-03-24 DIAGNOSIS — Z1509 Genetic susceptibility to other malignant neoplasm: Secondary | ICD-10-CM | POA: Insufficient documentation

## 2018-03-24 DIAGNOSIS — D696 Thrombocytopenia, unspecified: Secondary | ICD-10-CM | POA: Diagnosis not present

## 2018-03-24 DIAGNOSIS — D509 Iron deficiency anemia, unspecified: Secondary | ICD-10-CM

## 2018-03-24 DIAGNOSIS — E785 Hyperlipidemia, unspecified: Secondary | ICD-10-CM | POA: Diagnosis not present

## 2018-03-24 DIAGNOSIS — I350 Nonrheumatic aortic (valve) stenosis: Secondary | ICD-10-CM | POA: Insufficient documentation

## 2018-03-24 DIAGNOSIS — I6529 Occlusion and stenosis of unspecified carotid artery: Secondary | ICD-10-CM | POA: Diagnosis not present

## 2018-03-24 DIAGNOSIS — N183 Chronic kidney disease, stage 3 (moderate): Secondary | ICD-10-CM | POA: Diagnosis not present

## 2018-03-24 MED ORDER — OSIMERTINIB MESYLATE 80 MG PO TABS: 80.0000 mg | ORAL_TABLET | Freq: Every day | ORAL | 4 refills | Status: DC
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
## 2018-03-24 NOTE — Assessment & Plan Note (Addendum)
#  Stage IV recurrent adenocarcinoma the lung; EGFR mutation-present.  #Discussed the role of targeted therapy-with osimertinib 80 mg once a day.  Patient's in general tolerated the treatment well-potential side effects include skin rash diarrhea; EKG changes.  Patient's EKG July 2019 no obvious QT prolongation.  #The recent data from the [facility]-showed median overall survival about 38 months; and PFS about 18 to 20 months.  Response rates are 70%.  # Signet ring adenocarcinoma- gastric incisura; STAGE I; status post partial gastrectomy.  Proceed with EGD as planned/[place]sk[place].  #Moderate anemia hemoglobin 10-malabsorption/CKD iron deficiency-IV iron improved.  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  Stable  # CKD staghorn calculus- Stage III-creatinine creatinine creatinine 1.4 .  Stable  # CAD s/p stents [[place]] on plavix /aspirin; critical aortic stenosis-awaiting TAVR procedure-currently on hold given her oncologic issues/see above.   #With regards to TAVR surgery-I would recommend holding the surgery off at this time.  However if patient has a great response to osimertinib [which patient clinically should have]-I think it would be reasonable proceed with TAVR after restaging scan in about 3 months.   # DISPOSITION: # keep appt in Jan 20th/labs- cbc/cmp-Dr.B

## 2018-03-26 ENCOUNTER — Telehealth: Payer: Self-pay | Admitting: Pharmacist

## 2018-03-26 DIAGNOSIS — C3431 Malignant neoplasm of lower lobe, right bronchus or lung: Secondary | ICD-10-CM

## 2018-03-26 MED ORDER — OSIMERTINIB MESYLATE 80 MG PO TABS: 80.0000 mg | ORAL_TABLET | Freq: Every day | ORAL | 4 refills | Status: DC

## 2018-03-26 NOTE — Telephone Encounter (Signed)
Oral Oncology Pharmacist Encounter   Prior Authorization for [name] has been approved.     PA# [id number] Effective dates: 03/26/18 through 04/09/19  Copay: $100, contacted patient to inform her of copay and offer to sign her up for manufacturer assistance as foundation copay assistance is currently closed. LVM for patient call me back.   Oral Oncology Clinic will continue to follow.   Darl Pikes, PharmD, BCPS. BCOP Hematology/Oncology Clinical Pharmacist [facility] Oral Chem[facility] [phone]  03/26/2018 1:03 PM

## 2018-03-26 NOTE — Telephone Encounter (Signed)
Oral Oncology Pharmacist Encounter  Received new prescription for Tagrisso (osimertinib) for the treatment of Stage IV recurrent adenocarcinoma the lung; EGFR mutation positive (L858R), planned duration until disease progression or unacceptable drug toxicity.  Prescription dose and frequency assessed.   Current medication list in Epic reviewed, no DDIs with Tagrisso identified.  Prescription has been e-scribed to the [facility] for benefits analysis and approval.  Oral Oncology Clinic will continue to follow for insurance authorization, copayment issues, initial counseling and start date.  Darl Pikes, PharmD, BCPS, [facility] Hematology/Oncology Clinical Pharmacist [facility] [place] [facility] [phone]  03/26/2018 11:47 AM

## 2018-03-26 NOTE — Telephone Encounter (Signed)
Oral Oncology Pharmacist Encounter   Received notification from [employer] that prior authorization for Tagrisso is required.   PA submitted on CMM Key [id number] Status is pending   Oral Oncology Clinic will continue to follow.   Darl Pikes, PharmD, BCPS, [facility] Hematology/Oncology Clinical Pharmacist [facility] Oral [place] [facility] [phone]  03/26/2018 12:49 PM

## 2018-03-27 ENCOUNTER — Ambulatory Visit: Payer: Medicare Other | Admitting: General Surgery

## 2018-03-27 ENCOUNTER — Encounter: Payer: Self-pay | Admitting: General Surgery

## 2018-03-27 ENCOUNTER — Other Ambulatory Visit: Payer: Self-pay

## 2018-03-27 VITALS — BP 180/68 | HR 75 | Temp 97.9°F | Resp 16 | Ht 60.0 in | Wt 134.0 lb

## 2018-03-27 DIAGNOSIS — C169 Malignant neoplasm of stomach, unspecified: Secondary | ICD-10-CM | POA: Diagnosis not present
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
## 2018-03-27 NOTE — Patient Instructions (Addendum)
As needed. The patient is aware to call back for any questions or concerns.

## 2018-04-04 ENCOUNTER — Telehealth: Payer: Self-pay | Admitting: Pharmacy Technician

## 2018-04-04 NOTE — Telephone Encounter (Signed)
Oral Oncology Patient Advocate Encounter  03/31/18- Called to check status of application and [employer] had patient application temporarily approved.  They were having medication sent out to patient in 1-3 business days.  I spoke to patient and told her to call when she received medication and to not start it until she spoke with the pharmacist, Alyson.  She understood.

## 2018-04-04 NOTE — Telephone Encounter (Signed)
Received fax from [employer] needing allergy and medication list.  Faxed to [phone].

## 2018-04-04 NOTE — Telephone Encounter (Signed)
Oral Oncology Patient Advocate Encounter  03/27/18- Faxed completed application for [place] [employer] [place] Patient Assistance Program in an effort to reduce the patient's out of pocket expense for Tagrisso to $0.    Application faxed to [phone].   [employer] patient assistance phone number for follow up is [phone].   This encounter will be updated until final determination.

## 2018-04-07 ENCOUNTER — Telehealth: Payer: Self-pay | Admitting: Pharmacist

## 2018-04-07 NOTE — Telephone Encounter (Signed)
Oral Chemotherapy Pharmacist Encounter  Ms. Ahonen received her medication in the mail on 04/07/18. SPoke with Dr. Rogue Bussing and he would like for Ms. Galer to get started on her Tagrisso. Ms. Kindig wants to take her medication in the morning and plans on taking her first dose on 04/08/18.  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    Pt is doing well. Counseled patient on administration, dosing, side effects, monitoring, drug-food interactions, safe handling, storage, and disposal. Patient will take 1 tablet (80 mg total) by mouth daily.  Side effects include but not limited to: diarrhea, rash, decreased wbc/plt/hgb.    Reviewed with patient importance of keeping a medication schedule and plan for any missed doses.  Ms. Kluth voiced understanding and appreciation. All questions answered. Medication handout placed in the mail.  Provided patient with [facility] phone number. Patient knows to call the office with questions or concerns. [facility] will continue to follow.  Darl Pikes, PharmD, BCPS, [facility] Hematology/Oncology Clinical Pharmacist [facility] [phone]  04/07/2018 4:36 PM

## 2018-04-08 NOTE — Telephone Encounter (Signed)
Oral Oncology Patient Advocate Encounter  Patient was approved until 04/08/18 and will automatically be re-enrolled in [employer] patient assistance from 04/09/18-04/09/19.

## 2018-04-23 ENCOUNTER — Ambulatory Visit: Payer: Medicare Other

## 2018-04-25 ENCOUNTER — Other Ambulatory Visit: Payer: Self-pay | Admitting: *Deleted

## 2018-04-25 DIAGNOSIS — C3431 Malignant neoplasm of lower lobe, right bronchus or lung: Secondary | ICD-10-CM

## 2018-04-28 ENCOUNTER — Inpatient Hospital Stay (HOSPITAL_BASED_OUTPATIENT_CLINIC_OR_DEPARTMENT_OTHER): Payer: Medicare Other | Admitting: Internal Medicine

## 2018-04-28 ENCOUNTER — Inpatient Hospital Stay: Payer: Medicare Other

## 2018-04-28 ENCOUNTER — Inpatient Hospital Stay: Payer: Medicare Other | Attending: Internal Medicine

## 2018-04-28 ENCOUNTER — Encounter: Payer: Self-pay | Admitting: Internal Medicine

## 2018-04-28 VITALS — BP 159/74 | HR 54 | Temp 97.8°F | Resp 16 | Wt 129.6 lb

## 2018-04-28 DIAGNOSIS — R0602 Shortness of breath: Secondary | ICD-10-CM

## 2018-04-28 DIAGNOSIS — K909 Intestinal malabsorption, unspecified: Secondary | ICD-10-CM | POA: Diagnosis not present

## 2018-04-28 DIAGNOSIS — I2510 Atherosclerotic heart disease of native coronary artery without angina pectoris: Secondary | ICD-10-CM

## 2018-04-28 DIAGNOSIS — N183 Chronic kidney disease, stage 3 (moderate): Secondary | ICD-10-CM | POA: Diagnosis not present

## 2018-04-28 DIAGNOSIS — E538 Deficiency of other specified B group vitamins: Secondary | ICD-10-CM | POA: Insufficient documentation

## 2018-04-28 DIAGNOSIS — Z79899 Other long term (current) drug therapy: Secondary | ICD-10-CM | POA: Diagnosis not present

## 2018-04-28 DIAGNOSIS — D500 Iron deficiency anemia secondary to blood loss (chronic): Secondary | ICD-10-CM | POA: Insufficient documentation

## 2018-04-28 DIAGNOSIS — E785 Hyperlipidemia, unspecified: Secondary | ICD-10-CM | POA: Diagnosis not present

## 2018-04-28 DIAGNOSIS — C160 Malignant neoplasm of cardia: Secondary | ICD-10-CM | POA: Diagnosis not present

## 2018-04-28 DIAGNOSIS — I129 Hypertensive chronic kidney disease with stage 1 through stage 4 chronic kidney disease, or unspecified chronic kidney disease: Secondary | ICD-10-CM

## 2018-04-28 DIAGNOSIS — R531 Weakness: Secondary | ICD-10-CM | POA: Diagnosis not present

## 2018-04-28 DIAGNOSIS — C3431 Malignant neoplasm of lower lobe, right bronchus or lung: Secondary | ICD-10-CM | POA: Diagnosis not present

## 2018-04-28 DIAGNOSIS — I10 Essential (primary) hypertension: Secondary | ICD-10-CM | POA: Insufficient documentation

## 2018-04-28 DIAGNOSIS — M129 Arthropathy, unspecified: Secondary | ICD-10-CM | POA: Insufficient documentation

## 2018-04-28 DIAGNOSIS — R05 Cough: Secondary | ICD-10-CM | POA: Diagnosis not present

## 2018-04-28 DIAGNOSIS — D696 Thrombocytopenia, unspecified: Secondary | ICD-10-CM

## 2018-04-28 DIAGNOSIS — E1122 Type 2 diabetes mellitus with diabetic chronic kidney disease: Secondary | ICD-10-CM | POA: Insufficient documentation

## 2018-04-28 DIAGNOSIS — K219 Gastro-esophageal reflux disease without esophagitis: Secondary | ICD-10-CM

## 2018-04-28 DIAGNOSIS — Z7982 Long term (current) use of aspirin: Secondary | ICD-10-CM | POA: Diagnosis not present

## 2018-04-28 DIAGNOSIS — I350 Nonrheumatic aortic (valve) stenosis: Secondary | ICD-10-CM | POA: Diagnosis not present

## 2018-04-28 DIAGNOSIS — N200 Calculus of kidney: Secondary | ICD-10-CM | POA: Insufficient documentation

## 2018-04-28 DIAGNOSIS — R5383 Other fatigue: Secondary | ICD-10-CM | POA: Diagnosis not present

## 2018-04-28 LAB — CBC WITH DIFFERENTIAL/PLATELET
Abs Immature Granulocytes: 0.01 10*3/uL (ref 0.00–0.07)
BASOS PCT: 1 %
Basophils Absolute: 0 10*3/uL (ref 0.0–0.1)
EOS ABS: 0.1 10*3/uL (ref 0.0–0.5)
EOS PCT: 2 %
HCT: 33 % — ABNORMAL LOW (ref 36.0–46.0)
Hemoglobin: 10.9 g/dL — ABNORMAL LOW (ref 12.0–15.0)
Immature Granulocytes: 0 %
Lymphocytes Relative: 22 %
Lymphs Abs: 0.9 10*3/uL (ref 0.7–4.0)
MCH: 29.9 pg (ref 26.0–34.0)
MCHC: 33 g/dL (ref 30.0–36.0)
MCV: 90.7 fL (ref 80.0–100.0)
MONO ABS: 0.2 10*3/uL (ref 0.1–1.0)
MONOS PCT: 5 %
NEUTROS ABS: 2.8 10*3/uL (ref 1.7–7.7)
Neutrophils Relative %: 70 %
PLATELETS: 63 10*3/uL — AB (ref 150–400)
RBC: 3.64 MIL/uL — ABNORMAL LOW (ref 3.87–5.11)
RDW: 12.1 % (ref 11.5–15.5)
WBC: 4 10*3/uL (ref 4.0–10.5)
nRBC: 0 % (ref 0.0–0.2)

## 2018-04-28 LAB — COMPREHENSIVE METABOLIC PANEL
ALT: 12 U/L (ref 0–44)
ANION GAP: 9 (ref 5–15)
AST: 17 U/L (ref 15–41)
Albumin: 4.4 g/dL (ref 3.5–5.0)
Alkaline Phosphatase: 71 U/L (ref 38–126)
BUN: 30 mg/dL — AB (ref 8–23)
CALCIUM: 9 mg/dL (ref 8.9–10.3)
CHLORIDE: 108 mmol/L (ref 98–111)
CO2: 24 mmol/L (ref 22–32)
CREATININE: 1.62 mg/dL — AB (ref 0.44–1.00)
GFR, EST AFRICAN AMERICAN: 34 mL/min — AB (ref >60)
GFR, EST NON AFRICAN AMERICAN: 30 mL/min — AB (ref >60)
Glucose, Bld: 92 mg/dL (ref 70–99)
Potassium: 4.3 mmol/L (ref 3.5–5.1)
SODIUM: 141 mmol/L (ref 135–145)
TOTAL PROTEIN: 7.2 g/dL (ref 6.5–8.1)
Total Bilirubin: 0.8 mg/dL (ref 0.3–1.2)
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
## 2018-04-28 NOTE — Assessment & Plan Note (Addendum)
#  Stage IV recurrent adenocarcinoma the lung; EGFR mutation-present. On  osimertinib 80 mg once a day.   #Again discussed the goals of treatment are palliative.  However I would expect to 70% response rates; and the median survival is about 3 years or so.  # Cough- not improving- will call if worse; then order CXR.    # Signet ring adenocarcinoma- gastric incisura; STAGE I; status post partial gastrectomy.stable.   #Moderate anemia hemoglobin 10-malabsorption/CKD iron deficiency-IV iron improved. Stable.   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  Stable  # CKD staghorn calculus- Stage III-creatinine creatinine creatinine 1.6; monitor; STABLE.   # CAD s/p stents [[place]] on plavix /aspirin; critical aortic stenosis-holding off TAVR procedure. Stable.   # DISPOSITION: # NO iron today.  # follow up in 3 weeks/labs-cbc/cmp-Dr.B

## 2018-05-19 ENCOUNTER — Inpatient Hospital Stay (HOSPITAL_BASED_OUTPATIENT_CLINIC_OR_DEPARTMENT_OTHER): Payer: Medicare Other | Admitting: Internal Medicine

## 2018-05-19 ENCOUNTER — Other Ambulatory Visit: Payer: Self-pay

## 2018-05-19 ENCOUNTER — Encounter: Payer: Self-pay | Admitting: Internal Medicine

## 2018-05-19 ENCOUNTER — Inpatient Hospital Stay: Payer: Medicare Other | Attending: Internal Medicine

## 2018-05-19 VITALS — BP 180/81 | HR 61 | Temp 96.9°F | Wt 128.0 lb

## 2018-05-19 DIAGNOSIS — C3431 Malignant neoplasm of lower lobe, right bronchus or lung: Secondary | ICD-10-CM

## 2018-05-19 DIAGNOSIS — Z7982 Long term (current) use of aspirin: Secondary | ICD-10-CM | POA: Diagnosis not present

## 2018-05-19 DIAGNOSIS — I2510 Atherosclerotic heart disease of native coronary artery without angina pectoris: Secondary | ICD-10-CM | POA: Diagnosis not present

## 2018-05-19 DIAGNOSIS — N189 Chronic kidney disease, unspecified: Secondary | ICD-10-CM | POA: Insufficient documentation

## 2018-05-19 DIAGNOSIS — Z955 Presence of coronary angioplasty implant and graft: Secondary | ICD-10-CM | POA: Insufficient documentation

## 2018-05-19 DIAGNOSIS — D693 Immune thrombocytopenic purpura: Secondary | ICD-10-CM | POA: Diagnosis not present

## 2018-05-19 DIAGNOSIS — D509 Iron deficiency anemia, unspecified: Secondary | ICD-10-CM | POA: Insufficient documentation

## 2018-05-19 DIAGNOSIS — Z79899 Other long term (current) drug therapy: Secondary | ICD-10-CM | POA: Insufficient documentation

## 2018-05-19 DIAGNOSIS — N184 Chronic kidney disease, stage 4 (severe): Secondary | ICD-10-CM

## 2018-05-19 DIAGNOSIS — Z7902 Long term (current) use of antithrombotics/antiplatelets: Secondary | ICD-10-CM | POA: Insufficient documentation

## 2018-05-19 DIAGNOSIS — I350 Nonrheumatic aortic (valve) stenosis: Secondary | ICD-10-CM

## 2018-05-19 LAB — CBC WITH DIFFERENTIAL/PLATELET
Abs Immature Granulocytes: 0.01 10*3/uL (ref 0.00–0.07)
BASOS ABS: 0 10*3/uL (ref 0.0–0.1)
BASOS PCT: 0 %
Eosinophils Absolute: 0.1 10*3/uL (ref 0.0–0.5)
Eosinophils Relative: 2 %
HEMATOCRIT: 31.5 % — AB (ref 36.0–46.0)
HEMOGLOBIN: 10.2 g/dL — AB (ref 12.0–15.0)
IMMATURE GRANULOCYTES: 0 %
Lymphocytes Relative: 31 %
Lymphs Abs: 1.1 10*3/uL (ref 0.7–4.0)
MCH: 29.2 pg (ref 26.0–34.0)
MCHC: 32.4 g/dL (ref 30.0–36.0)
MCV: 90.3 fL (ref 80.0–100.0)
Monocytes Absolute: 0.3 10*3/uL (ref 0.1–1.0)
Monocytes Relative: 8 %
NEUTROS ABS: 2.1 10*3/uL (ref 1.7–7.7)
NEUTROS PCT: 59 %
NRBC: 0 % (ref 0.0–0.2)
Platelets: 57 10*3/uL — ABNORMAL LOW (ref 150–400)
RBC: 3.49 MIL/uL — AB (ref 3.87–5.11)
RDW: 12.5 % (ref 11.5–15.5)
WBC: 3.7 10*3/uL — AB (ref 4.0–10.5)

## 2018-05-19 LAB — COMPREHENSIVE METABOLIC PANEL
ALT: 12 U/L (ref 0–44)
ANION GAP: 5 (ref 5–15)
AST: 17 U/L (ref 15–41)
Albumin: 4.3 g/dL (ref 3.5–5.0)
Alkaline Phosphatase: 60 U/L (ref 38–126)
BILIRUBIN TOTAL: 0.7 mg/dL (ref 0.3–1.2)
BUN: 34 mg/dL — ABNORMAL HIGH (ref 8–23)
CO2: 24 mmol/L (ref 22–32)
Calcium: 9 mg/dL (ref 8.9–10.3)
Chloride: 112 mmol/L — ABNORMAL HIGH (ref 98–111)
Creatinine, Ser: 1.92 mg/dL — ABNORMAL HIGH (ref 0.44–1.00)
GFR calc Af Amer: 28 mL/min — ABNORMAL LOW (ref >60)
GFR, EST NON AFRICAN AMERICAN: 24 mL/min — AB (ref >60)
Glucose, Bld: 79 mg/dL (ref 70–99)
POTASSIUM: 4 mmol/L (ref 3.5–5.1)
Sodium: 141 mmol/L (ref 135–145)
TOTAL PROTEIN: 6.9 g/dL (ref 6.5–8.1)

## 2018-05-19 LAB — URINALYSIS, COMPLETE (UACMP) WITH MICROSCOPIC
BILIRUBIN URINE: NEGATIVE
Glucose, UA: NEGATIVE mg/dL
Ketones, ur: NEGATIVE mg/dL
Nitrite: NEGATIVE
Protein, ur: NEGATIVE mg/dL
RBC / HPF: >50 RBC/hpf — ABNORMAL HIGH (ref 0–5)
Specific Gravity, Urine: 1.011 (ref 1.005–1.030)
pH: 5 (ref 5.0–8.0)

## 2018-05-19 NOTE — Assessment & Plan Note (Addendum)
#  Stage IV recurrent adenocarcinoma the lung; EGFR mutation-present. On  osimertinib 80 mg once a day.  Clinically stable.  No evidence of progression.  # Skin rash/itchy-new.  Sec to osimertinib- hydocortsione topical BID.   #  CKD [staghorn calculus]- worseing function; check UA. Referral to Nephrology.  Do not suspect from osimertinib.  Kidney ultrasound September 2019- negative for obstruction.  #Moderate anemia hemoglobin 10-malabsorption/CKD iron deficiency hemoglobin stable around 9.7.  #ITP chronic however today platelets 63; sec to osimertinib monitor closely.    # CAD s/p stents [[place]] on plavix /aspirin; critical aortic stenosis-holding off TAVR procedure.clinically stable.  # DISPOSITION:  # UA today.  # follow up in 3 weeks- MD/labs- cbc/bmp.  # Referral to Nephrology re: CKD-Dr.B
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
## 2018-05-27 ENCOUNTER — Other Ambulatory Visit: Payer: Self-pay | Admitting: *Deleted

## 2018-05-27 ENCOUNTER — Telehealth: Payer: Self-pay | Admitting: *Deleted

## 2018-05-27 DIAGNOSIS — C3431 Malignant neoplasm of lower lobe, right bronchus or lung: Secondary | ICD-10-CM

## 2018-05-27 NOTE — Telephone Encounter (Signed)
Patient called RN and reported the following blood pressure readings (over the last week). These were all sitting down at rest.   05/20/18 (am)- 141/53; 05/20/18.   05/20/18- Patient rechecked at [place] (no dizziness reported)  05/21/18-156/57 sitting down at rest  05/22/18-128/69 sitting down at rest  05/23/18-146/55 (at noon) - sitting at rest  05/24/18- 149/50 sitting down at rest  05/25/18- 150/59 sitting down at rest   05/26/18- 126/50  sitting down at rest

## 2018-06-09 ENCOUNTER — Inpatient Hospital Stay: Payer: Medicare Other | Admitting: Internal Medicine

## 2018-06-09 ENCOUNTER — Inpatient Hospital Stay: Payer: Medicare Other | Attending: Internal Medicine

## 2018-06-09 ENCOUNTER — Encounter: Payer: Self-pay | Admitting: Internal Medicine

## 2018-06-09 ENCOUNTER — Other Ambulatory Visit: Payer: Self-pay | Admitting: *Deleted

## 2018-06-09 DIAGNOSIS — K219 Gastro-esophageal reflux disease without esophagitis: Secondary | ICD-10-CM | POA: Diagnosis not present

## 2018-06-09 DIAGNOSIS — D509 Iron deficiency anemia, unspecified: Secondary | ICD-10-CM | POA: Insufficient documentation

## 2018-06-09 DIAGNOSIS — I700 Atherosclerosis of aorta: Secondary | ICD-10-CM | POA: Diagnosis not present

## 2018-06-09 DIAGNOSIS — R21 Rash and other nonspecific skin eruption: Secondary | ICD-10-CM | POA: Diagnosis not present

## 2018-06-09 DIAGNOSIS — R5383 Other fatigue: Secondary | ICD-10-CM | POA: Insufficient documentation

## 2018-06-09 DIAGNOSIS — I2510 Atherosclerotic heart disease of native coronary artery without angina pectoris: Secondary | ICD-10-CM | POA: Diagnosis not present

## 2018-06-09 DIAGNOSIS — C3431 Malignant neoplasm of lower lobe, right bronchus or lung: Secondary | ICD-10-CM | POA: Insufficient documentation

## 2018-06-09 DIAGNOSIS — E538 Deficiency of other specified B group vitamins: Secondary | ICD-10-CM | POA: Insufficient documentation

## 2018-06-09 DIAGNOSIS — E042 Nontoxic multinodular goiter: Secondary | ICD-10-CM | POA: Diagnosis not present

## 2018-06-09 DIAGNOSIS — E1122 Type 2 diabetes mellitus with diabetic chronic kidney disease: Secondary | ICD-10-CM

## 2018-06-09 DIAGNOSIS — Z7902 Long term (current) use of antithrombotics/antiplatelets: Secondary | ICD-10-CM

## 2018-06-09 DIAGNOSIS — M1990 Unspecified osteoarthritis, unspecified site: Secondary | ICD-10-CM | POA: Diagnosis not present

## 2018-06-09 DIAGNOSIS — J90 Pleural effusion, not elsewhere classified: Secondary | ICD-10-CM | POA: Diagnosis not present

## 2018-06-09 DIAGNOSIS — K2940 Chronic atrophic gastritis without bleeding: Secondary | ICD-10-CM | POA: Diagnosis not present

## 2018-06-09 DIAGNOSIS — D693 Immune thrombocytopenic purpura: Secondary | ICD-10-CM | POA: Insufficient documentation

## 2018-06-09 DIAGNOSIS — N189 Chronic kidney disease, unspecified: Secondary | ICD-10-CM

## 2018-06-09 DIAGNOSIS — N200 Calculus of kidney: Secondary | ICD-10-CM

## 2018-06-09 DIAGNOSIS — Z8711 Personal history of peptic ulcer disease: Secondary | ICD-10-CM | POA: Diagnosis not present

## 2018-06-09 DIAGNOSIS — M5136 Other intervertebral disc degeneration, lumbar region: Secondary | ICD-10-CM | POA: Diagnosis not present

## 2018-06-09 DIAGNOSIS — E785 Hyperlipidemia, unspecified: Secondary | ICD-10-CM

## 2018-06-09 DIAGNOSIS — Z79899 Other long term (current) drug therapy: Secondary | ICD-10-CM | POA: Insufficient documentation

## 2018-06-09 DIAGNOSIS — Z7982 Long term (current) use of aspirin: Secondary | ICD-10-CM

## 2018-06-09 DIAGNOSIS — I6529 Occlusion and stenosis of unspecified carotid artery: Secondary | ICD-10-CM | POA: Diagnosis not present

## 2018-06-09 DIAGNOSIS — I350 Nonrheumatic aortic (valve) stenosis: Secondary | ICD-10-CM

## 2018-06-09 DIAGNOSIS — I129 Hypertensive chronic kidney disease with stage 1 through stage 4 chronic kidney disease, or unspecified chronic kidney disease: Secondary | ICD-10-CM | POA: Insufficient documentation

## 2018-06-09 LAB — BASIC METABOLIC PANEL
Anion gap: 7 (ref 5–15)
BUN: 26 mg/dL — AB (ref 8–23)
CO2: 25 mmol/L (ref 22–32)
Calcium: 8.7 mg/dL — ABNORMAL LOW (ref 8.9–10.3)
Chloride: 110 mmol/L (ref 98–111)
Creatinine, Ser: 1.92 mg/dL — ABNORMAL HIGH (ref 0.44–1.00)
GFR calc Af Amer: 28 mL/min — ABNORMAL LOW (ref >60)
GFR calc non Af Amer: 24 mL/min — ABNORMAL LOW (ref >60)
Glucose, Bld: 90 mg/dL (ref 70–99)
Potassium: 3.5 mmol/L (ref 3.5–5.1)
SODIUM: 142 mmol/L (ref 135–145)

## 2018-06-09 LAB — CBC WITH DIFFERENTIAL/PLATELET
Abs Immature Granulocytes: 0.01 10*3/uL (ref 0.00–0.07)
Basophils Absolute: 0 10*3/uL (ref 0.0–0.1)
Basophils Relative: 0 %
Eosinophils Absolute: 0.1 10*3/uL (ref 0.0–0.5)
Eosinophils Relative: 2 %
HCT: 29.5 % — ABNORMAL LOW (ref 36.0–46.0)
Hemoglobin: 9.8 g/dL — ABNORMAL LOW (ref 12.0–15.0)
Immature Granulocytes: 0 %
Lymphocytes Relative: 28 %
Lymphs Abs: 0.9 10*3/uL (ref 0.7–4.0)
MCH: 30 pg (ref 26.0–34.0)
MCHC: 33.2 g/dL (ref 30.0–36.0)
MCV: 90.2 fL (ref 80.0–100.0)
Monocytes Absolute: 0.2 10*3/uL (ref 0.1–1.0)
Monocytes Relative: 7 %
Neutro Abs: 2.1 10*3/uL (ref 1.7–7.7)
Neutrophils Relative %: 63 %
Platelets: 55 10*3/uL — ABNORMAL LOW (ref 150–400)
RBC: 3.27 MIL/uL — ABNORMAL LOW (ref 3.87–5.11)
RDW: 12.9 % (ref 11.5–15.5)
WBC: 3.4 10*3/uL — AB (ref 4.0–10.5)
nRBC: 0 % (ref 0.0–0.2)

## 2018-06-09 LAB — IRON AND TIBC
Iron: 60 ug/dL (ref 28–170)
Saturation Ratios: 26 % (ref 10.4–31.8)
TIBC: 233 ug/dL — ABNORMAL LOW (ref 250–450)
UIBC: 173 ug/dL

## 2018-06-09 LAB — FERRITIN: Ferritin: 224 ng/mL (ref 11–307)
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
## 2018-06-09 NOTE — Assessment & Plan Note (Addendum)
#  Stage IV recurrent adenocarcinoma the lung; EGFR mutation-present. On  osimertinib 80 mg once a day.  Stable continue the same.  # Skin rash/itchy- Sec to osimertinib- hydrocortisone-improved.  #  CKD [staghorn calculus]- worseing function; check UA. Referral to Nephrology.  Do not suspect from osimertinib.  Kidney ultrasound September 2019- negative for obstruction.  #Moderate anemia hemoglobin 10-malabsorption/CKD iron deficiency hemoglobin stable around 9.7.  #ITP chronic however today platelets 85 sec to osimertinib monitor closely.    # CAD s/p stents [[place]] on plavix /aspirin; critical aortic stenosis-holding off TAVR procedure.  Stable.  # DISPOSITION: add iron studies/ferritin today's labs # follow up in 3 weeks- MD/labs- cbc/bmp; CT chest prior-non-contrast; possible Venofer  # Referral to Nephrology re: CKD-Dr.B

## 2018-06-30 ENCOUNTER — Ambulatory Visit
Admission: RE | Admit: 2018-06-30 | Discharge: 2018-06-30 | Disposition: A | Discharge status: Home / Self Care | Payer: Medicare Other | Source: Ambulatory Visit | Attending: Internal Medicine | Admitting: Internal Medicine

## 2018-06-30 ENCOUNTER — Other Ambulatory Visit: Payer: Self-pay

## 2018-06-30 DIAGNOSIS — C3431 Malignant neoplasm of lower lobe, right bronchus or lung: Secondary | ICD-10-CM

## 2018-07-01 ENCOUNTER — Inpatient Hospital Stay (HOSPITAL_BASED_OUTPATIENT_CLINIC_OR_DEPARTMENT_OTHER): Payer: Medicare Other | Admitting: Internal Medicine

## 2018-07-01 ENCOUNTER — Inpatient Hospital Stay: Payer: Medicare Other

## 2018-07-01 ENCOUNTER — Encounter: Payer: Self-pay | Admitting: Internal Medicine

## 2018-07-01 ENCOUNTER — Other Ambulatory Visit: Payer: Self-pay

## 2018-07-01 VITALS — BP 186/51 | HR 50 | Wt 125.4 lb

## 2018-07-01 DIAGNOSIS — C3431 Malignant neoplasm of lower lobe, right bronchus or lung: Secondary | ICD-10-CM

## 2018-07-01 DIAGNOSIS — Z8711 Personal history of peptic ulcer disease: Secondary | ICD-10-CM

## 2018-07-01 DIAGNOSIS — E785 Hyperlipidemia, unspecified: Secondary | ICD-10-CM

## 2018-07-01 DIAGNOSIS — M5136 Other intervertebral disc degeneration, lumbar region: Secondary | ICD-10-CM

## 2018-07-01 DIAGNOSIS — I6529 Occlusion and stenosis of unspecified carotid artery: Secondary | ICD-10-CM

## 2018-07-01 DIAGNOSIS — E538 Deficiency of other specified B group vitamins: Secondary | ICD-10-CM

## 2018-07-01 DIAGNOSIS — N189 Chronic kidney disease, unspecified: Secondary | ICD-10-CM

## 2018-07-01 DIAGNOSIS — R21 Rash and other nonspecific skin eruption: Secondary | ICD-10-CM | POA: Diagnosis not present

## 2018-07-01 DIAGNOSIS — Z79899 Other long term (current) drug therapy: Secondary | ICD-10-CM

## 2018-07-01 DIAGNOSIS — K2940 Chronic atrophic gastritis without bleeding: Secondary | ICD-10-CM | POA: Diagnosis not present

## 2018-07-01 DIAGNOSIS — D693 Immune thrombocytopenic purpura: Secondary | ICD-10-CM

## 2018-07-01 DIAGNOSIS — N200 Calculus of kidney: Secondary | ICD-10-CM

## 2018-07-01 DIAGNOSIS — I129 Hypertensive chronic kidney disease with stage 1 through stage 4 chronic kidney disease, or unspecified chronic kidney disease: Secondary | ICD-10-CM

## 2018-07-01 DIAGNOSIS — I350 Nonrheumatic aortic (valve) stenosis: Secondary | ICD-10-CM

## 2018-07-01 DIAGNOSIS — Z7902 Long term (current) use of antithrombotics/antiplatelets: Secondary | ICD-10-CM

## 2018-07-01 DIAGNOSIS — Z7982 Long term (current) use of aspirin: Secondary | ICD-10-CM

## 2018-07-01 DIAGNOSIS — K219 Gastro-esophageal reflux disease without esophagitis: Secondary | ICD-10-CM

## 2018-07-01 DIAGNOSIS — E042 Nontoxic multinodular goiter: Secondary | ICD-10-CM

## 2018-07-01 DIAGNOSIS — R5383 Other fatigue: Secondary | ICD-10-CM

## 2018-07-01 DIAGNOSIS — I2510 Atherosclerotic heart disease of native coronary artery without angina pectoris: Secondary | ICD-10-CM

## 2018-07-01 DIAGNOSIS — E1122 Type 2 diabetes mellitus with diabetic chronic kidney disease: Secondary | ICD-10-CM | POA: Diagnosis not present

## 2018-07-01 DIAGNOSIS — J90 Pleural effusion, not elsewhere classified: Secondary | ICD-10-CM

## 2018-07-01 DIAGNOSIS — D509 Iron deficiency anemia, unspecified: Secondary | ICD-10-CM

## 2018-07-01 DIAGNOSIS — M1990 Unspecified osteoarthritis, unspecified site: Secondary | ICD-10-CM

## 2018-07-01 DIAGNOSIS — I700 Atherosclerosis of aorta: Secondary | ICD-10-CM

## 2018-07-01 LAB — COMPREHENSIVE METABOLIC PANEL
ALBUMIN: 4.1 g/dL (ref 3.5–5.0)
ALT: 10 U/L (ref 0–44)
AST: 15 U/L (ref 15–41)
Alkaline Phosphatase: 57 U/L (ref 38–126)
Anion gap: 7 (ref 5–15)
BUN: 20 mg/dL (ref 8–23)
CO2: 26 mmol/L (ref 22–32)
Calcium: 8.9 mg/dL (ref 8.9–10.3)
Chloride: 110 mmol/L (ref 98–111)
Creatinine, Ser: 1.66 mg/dL — ABNORMAL HIGH (ref 0.44–1.00)
GFR calc Af Amer: 33 mL/min — ABNORMAL LOW (ref >60)
GFR calc non Af Amer: 29 mL/min — ABNORMAL LOW (ref >60)
Glucose, Bld: 87 mg/dL (ref 70–99)
Potassium: 3.8 mmol/L (ref 3.5–5.1)
Sodium: 143 mmol/L (ref 135–145)
Total Bilirubin: 0.9 mg/dL (ref 0.3–1.2)
Total Protein: 6.6 g/dL (ref 6.5–8.1)

## 2018-07-01 LAB — CBC WITH DIFFERENTIAL/PLATELET
Abs Immature Granulocytes: 0.01 10*3/uL (ref 0.00–0.07)
BASOS PCT: 1 %
Basophils Absolute: 0 10*3/uL (ref 0.0–0.1)
Eosinophils Absolute: 0.1 10*3/uL (ref 0.0–0.5)
Eosinophils Relative: 2 %
HCT: 31.5 % — ABNORMAL LOW (ref 36.0–46.0)
Hemoglobin: 10.4 g/dL — ABNORMAL LOW (ref 12.0–15.0)
Immature Granulocytes: 0 %
Lymphocytes Relative: 29 %
Lymphs Abs: 0.9 10*3/uL (ref 0.7–4.0)
MCH: 30.7 pg (ref 26.0–34.0)
MCHC: 33 g/dL (ref 30.0–36.0)
MCV: 92.9 fL (ref 80.0–100.0)
MONOS PCT: 7 %
Monocytes Absolute: 0.2 10*3/uL (ref 0.1–1.0)
NEUTROS ABS: 1.8 10*3/uL (ref 1.7–7.7)
Neutrophils Relative %: 61 %
Platelets: 55 10*3/uL — ABNORMAL LOW (ref 150–400)
RBC: 3.39 MIL/uL — ABNORMAL LOW (ref 3.87–5.11)
RDW: 13.1 % (ref 11.5–15.5)
WBC: 2.9 10*3/uL — ABNORMAL LOW (ref 4.0–10.5)
nRBC: 0 % (ref 0.0–0.2)
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
## 2018-07-01 NOTE — Assessment & Plan Note (Addendum)
#  Stage IV recurrent adenocarcinoma the lung; EGFR mutated. On  osimertinib 80 mg [Geb 6th 2020].  March 2020 CT scan-slightly increasing lobulated pleural effusion; slightly increased right anterior soft tissue area [previously FDG non-avid]; stable subcentimeter lung nodules.  Clinically stable  #Had a long discussion the patient given the above findings-discussed possible concerns of progression; but as patient is clinically stable I would recommend continued osimertinib at this time.  Subsequent patient options would include chemotherapy/which I think patient would be high risk for infections especially with the covid pandemic.   #  CKD [staghorn calculus]- worseing function;- awaiting neprho eval in may 2020.   #Moderate anemia hemoglobin 10-malabsorption/CKD.  Stable  # ITP/osimertinib-chronic however today platelets 55 sec to osimertinib monitor closely.    # CAD s/p stents [[place]] on plavix /aspirin; critical aortic stenosis-holding off TAVR procedure.stable.  I would recommend holding off any aggressive interventions at this time given the eqivocal CT scan.   # Educated the patient regarding novel coronavirus-modes of transmission/risks; and measures to avoid infection.   # DISPOSITION:  # follow up in 4 weeks-MD/labs- cbc/cmp-Dr.B  # 40 minutes face-to-face with the patient discussing the above plan of care; more than 50% of time spent on prognosis/ natural history; counseling and coordination.  # I reviewed the blood work- with the patient in detail; also reviewed the imaging independently [as summarized above]; and with the patient in detail.   Cc; PCP/cards.

## 2018-07-29 ENCOUNTER — Other Ambulatory Visit: Payer: Medicare Other

## 2018-07-29 ENCOUNTER — Ambulatory Visit: Payer: Medicare Other | Admitting: Internal Medicine

## 2018-07-31 ENCOUNTER — Telehealth: Payer: Self-pay | Admitting: *Deleted

## 2018-07-31 NOTE — Telephone Encounter (Signed)
Patient contacted per v/o Dr. Rogue Bussing. Discussed the option for virtual visit next Tuesday 4/28. Pt only agreeable to telephone visit via her home phone line.

## 2018-08-04 ENCOUNTER — Other Ambulatory Visit: Payer: Self-pay

## 2018-08-05 ENCOUNTER — Other Ambulatory Visit: Payer: Self-pay

## 2018-08-05 ENCOUNTER — Encounter: Payer: Self-pay | Admitting: Internal Medicine

## 2018-08-05 ENCOUNTER — Other Ambulatory Visit: Payer: Self-pay | Admitting: *Deleted

## 2018-08-05 ENCOUNTER — Inpatient Hospital Stay: Payer: Medicare Other | Attending: Internal Medicine | Admitting: Internal Medicine

## 2018-08-05 ENCOUNTER — Inpatient Hospital Stay: Payer: Medicare Other | Admitting: *Deleted

## 2018-08-05 VITALS — BP 203/63 | HR 67 | Temp 97.2°F | Resp 20 | Ht 60.0 in | Wt 124.4 lb

## 2018-08-05 DIAGNOSIS — K909 Intestinal malabsorption, unspecified: Secondary | ICD-10-CM | POA: Diagnosis not present

## 2018-08-05 DIAGNOSIS — R5383 Other fatigue: Secondary | ICD-10-CM | POA: Insufficient documentation

## 2018-08-05 DIAGNOSIS — Z95828 Presence of other vascular implants and grafts: Secondary | ICD-10-CM

## 2018-08-05 DIAGNOSIS — R221 Localized swelling, mass and lump, neck: Secondary | ICD-10-CM | POA: Insufficient documentation

## 2018-08-05 DIAGNOSIS — K219 Gastro-esophageal reflux disease without esophagitis: Secondary | ICD-10-CM | POA: Diagnosis not present

## 2018-08-05 DIAGNOSIS — D693 Immune thrombocytopenic purpura: Secondary | ICD-10-CM | POA: Insufficient documentation

## 2018-08-05 DIAGNOSIS — C3431 Malignant neoplasm of lower lobe, right bronchus or lung: Secondary | ICD-10-CM

## 2018-08-05 DIAGNOSIS — I350 Nonrheumatic aortic (valve) stenosis: Secondary | ICD-10-CM | POA: Diagnosis not present

## 2018-08-05 DIAGNOSIS — E119 Type 2 diabetes mellitus without complications: Secondary | ICD-10-CM | POA: Diagnosis not present

## 2018-08-05 DIAGNOSIS — Z7982 Long term (current) use of aspirin: Secondary | ICD-10-CM | POA: Diagnosis not present

## 2018-08-05 DIAGNOSIS — Z79899 Other long term (current) drug therapy: Secondary | ICD-10-CM | POA: Diagnosis not present

## 2018-08-05 DIAGNOSIS — E538 Deficiency of other specified B group vitamins: Secondary | ICD-10-CM | POA: Diagnosis not present

## 2018-08-05 DIAGNOSIS — I2510 Atherosclerotic heart disease of native coronary artery without angina pectoris: Secondary | ICD-10-CM | POA: Diagnosis not present

## 2018-08-05 DIAGNOSIS — R531 Weakness: Secondary | ICD-10-CM | POA: Insufficient documentation

## 2018-08-05 DIAGNOSIS — M5136 Other intervertebral disc degeneration, lumbar region: Secondary | ICD-10-CM | POA: Insufficient documentation

## 2018-08-05 DIAGNOSIS — M810 Age-related osteoporosis without current pathological fracture: Secondary | ICD-10-CM | POA: Diagnosis not present

## 2018-08-05 DIAGNOSIS — E785 Hyperlipidemia, unspecified: Secondary | ICD-10-CM | POA: Diagnosis not present

## 2018-08-05 DIAGNOSIS — Z85828 Personal history of other malignant neoplasm of skin: Secondary | ICD-10-CM | POA: Diagnosis not present

## 2018-08-05 DIAGNOSIS — M1990 Unspecified osteoarthritis, unspecified site: Secondary | ICD-10-CM | POA: Insufficient documentation

## 2018-08-05 DIAGNOSIS — D649 Anemia, unspecified: Secondary | ICD-10-CM | POA: Diagnosis not present

## 2018-08-05 DIAGNOSIS — N183 Chronic kidney disease, stage 3 (moderate): Secondary | ICD-10-CM | POA: Insufficient documentation

## 2018-08-05 DIAGNOSIS — I129 Hypertensive chronic kidney disease with stage 1 through stage 4 chronic kidney disease, or unspecified chronic kidney disease: Secondary | ICD-10-CM | POA: Insufficient documentation

## 2018-08-05 LAB — COMPREHENSIVE METABOLIC PANEL
ALT: 12 U/L (ref 0–44)
AST: 16 U/L (ref 15–41)
Albumin: 4 g/dL (ref 3.5–5.0)
Alkaline Phosphatase: 54 U/L (ref 38–126)
Anion gap: 6 (ref 5–15)
BUN: 20 mg/dL (ref 8–23)
CO2: 26 mmol/L (ref 22–32)
Calcium: 8.7 mg/dL — ABNORMAL LOW (ref 8.9–10.3)
Chloride: 111 mmol/L (ref 98–111)
Creatinine, Ser: 1.52 mg/dL — ABNORMAL HIGH (ref 0.44–1.00)
GFR calc Af Amer: 37 mL/min — ABNORMAL LOW (ref >60)
GFR calc non Af Amer: 32 mL/min — ABNORMAL LOW (ref >60)
Glucose, Bld: 92 mg/dL (ref 70–99)
Potassium: 3.5 mmol/L (ref 3.5–5.1)
Sodium: 143 mmol/L (ref 135–145)
Total Bilirubin: 1 mg/dL (ref 0.3–1.2)
Total Protein: 6.3 g/dL — ABNORMAL LOW (ref 6.5–8.1)

## 2018-08-05 LAB — CBC WITH DIFFERENTIAL/PLATELET
Abs Immature Granulocytes: 0 10*3/uL (ref 0.00–0.07)
Basophils Absolute: 0 10*3/uL (ref 0.0–0.1)
Basophils Relative: 0 %
Eosinophils Absolute: 0.1 10*3/uL (ref 0.0–0.5)
Eosinophils Relative: 3 %
HCT: 29.5 % — ABNORMAL LOW (ref 36.0–46.0)
Hemoglobin: 9.7 g/dL — ABNORMAL LOW (ref 12.0–15.0)
Immature Granulocytes: 0 %
Lymphocytes Relative: 26 %
Lymphs Abs: 0.7 10*3/uL (ref 0.7–4.0)
MCH: 30 pg (ref 26.0–34.0)
MCHC: 32.9 g/dL (ref 30.0–36.0)
MCV: 91.3 fL (ref 80.0–100.0)
Monocytes Absolute: 0.2 10*3/uL (ref 0.1–1.0)
Monocytes Relative: 6 %
Neutro Abs: 1.8 10*3/uL (ref 1.7–7.7)
Neutrophils Relative %: 65 %
Platelets: 48 10*3/uL — ABNORMAL LOW (ref 150–400)
RBC: 3.23 MIL/uL — ABNORMAL LOW (ref 3.87–5.11)
RDW: 12.6 % (ref 11.5–15.5)
WBC: 2.8 10*3/uL — ABNORMAL LOW (ref 4.0–10.5)
nRBC: 0 % (ref 0.0–0.2)

## 2018-08-05 MED ORDER — OSIMERTINIB MESYLATE 80 MG PO TABS: 80.0000 mg | ORAL_TABLET | Freq: Every day | ORAL | 4 refills | Status: DC
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
## 2018-08-05 NOTE — Assessment & Plan Note (Addendum)
#  Stage IV recurrent adenocarcinoma the lung; EGFR mutated. On  osimertinib 80 mg [Feb 6th 2020].  March 2020 CT scan-slightly increasing lobulated pleural effusion; slightly increased right anterior soft tissue area [previously FDG non-avid]; stable subcentimeter lung nodules.  Clinically stable; however small lump noted left posterior neck  #Small lump ~2 cm left posterior neck monitor closely.  Will get a PET scan prior to next visit.  Continue refill for Cymbalta.  #  CKD [staghorn calculus]-III-Stable.  Awaiting neprho eval in may 2020.   # Moderate anemia hemoglobin 9-10-malabsorption/CKD.  Stable.   # ITP/osimertinib-chronic however today platelets 48 sec to osimertinib monitor closely.  If worse recommend promacta especially as pt is on plavix.    # CAD s/p stents [[place]] on plavix /aspirin; critical aortic stenosis-holding off TAVR procedure.s stable  #Elevated blood pressure systolic 251G/[id number] hypertension asymptomatic.  Patient states her blood pressures are 130s to 140s at home.  She checks blood pressure at home regular basis.   # DISPOSITION:  # follow up in 5 weeks-MD/labs- cbc/cmp;PET prior Dr.B  Cc; [place].   Cc; PCP/cards.

## 2018-08-18 ENCOUNTER — Other Ambulatory Visit (HOSPITAL_COMMUNITY): Payer: Medicare Other

## 2018-09-04 ENCOUNTER — Encounter
Admission: RE | Admit: 2018-09-04 | Discharge: 2018-09-04 | Disposition: A | Discharge status: Home / Self Care | Payer: Medicare Other | Source: Ambulatory Visit | Attending: Internal Medicine | Admitting: Internal Medicine

## 2018-09-04 ENCOUNTER — Other Ambulatory Visit: Payer: Self-pay

## 2018-09-04 DIAGNOSIS — I2510 Atherosclerotic heart disease of native coronary artery without angina pectoris: Secondary | ICD-10-CM | POA: Diagnosis not present

## 2018-09-04 DIAGNOSIS — J90 Pleural effusion, not elsewhere classified: Secondary | ICD-10-CM | POA: Diagnosis not present

## 2018-09-04 DIAGNOSIS — C3431 Malignant neoplasm of lower lobe, right bronchus or lung: Secondary | ICD-10-CM | POA: Diagnosis not present

## 2018-09-04 DIAGNOSIS — Z903 Acquired absence of stomach [part of]: Secondary | ICD-10-CM | POA: Insufficient documentation

## 2018-09-04 LAB — GLUCOSE, CAPILLARY: Glucose-Capillary: 72 mg/dL (ref 70–99)

## 2018-09-04 MED ORDER — FLUDEOXYGLUCOSE F - 18 (FDG) INJECTION
6.2300 | Freq: Once | INTRAVENOUS | Status: AC | PRN
  Administered 2018-09-04: 6.23 via INTRAVENOUS

## 2018-09-08 ENCOUNTER — Other Ambulatory Visit: Payer: Self-pay

## 2018-09-08 ENCOUNTER — Other Ambulatory Visit: Payer: Self-pay | Admitting: *Deleted

## 2018-09-08 DIAGNOSIS — D508 Other iron deficiency anemias: Secondary | ICD-10-CM

## 2018-09-08 DIAGNOSIS — C160 Malignant neoplasm of cardia: Secondary | ICD-10-CM

## 2018-09-09 ENCOUNTER — Other Ambulatory Visit: Payer: Self-pay | Admitting: *Deleted

## 2018-09-09 ENCOUNTER — Telehealth: Payer: Self-pay | Admitting: *Deleted

## 2018-09-09 ENCOUNTER — Inpatient Hospital Stay (HOSPITAL_BASED_OUTPATIENT_CLINIC_OR_DEPARTMENT_OTHER): Payer: Medicare Other | Admitting: Internal Medicine

## 2018-09-09 ENCOUNTER — Other Ambulatory Visit: Payer: Self-pay

## 2018-09-09 ENCOUNTER — Inpatient Hospital Stay: Payer: Medicare Other | Attending: Internal Medicine

## 2018-09-09 VITALS — BP 195/60 | HR 54 | Temp 97.9°F | Resp 18 | Ht 60.0 in | Wt 118.4 lb

## 2018-09-09 DIAGNOSIS — J90 Pleural effusion, not elsewhere classified: Secondary | ICD-10-CM | POA: Insufficient documentation

## 2018-09-09 DIAGNOSIS — I2510 Atherosclerotic heart disease of native coronary artery without angina pectoris: Secondary | ICD-10-CM | POA: Diagnosis not present

## 2018-09-09 DIAGNOSIS — I129 Hypertensive chronic kidney disease with stage 1 through stage 4 chronic kidney disease, or unspecified chronic kidney disease: Secondary | ICD-10-CM | POA: Insufficient documentation

## 2018-09-09 DIAGNOSIS — R0602 Shortness of breath: Secondary | ICD-10-CM | POA: Diagnosis not present

## 2018-09-09 DIAGNOSIS — I6529 Occlusion and stenosis of unspecified carotid artery: Secondary | ICD-10-CM | POA: Diagnosis not present

## 2018-09-09 DIAGNOSIS — Z79899 Other long term (current) drug therapy: Secondary | ICD-10-CM | POA: Insufficient documentation

## 2018-09-09 DIAGNOSIS — I350 Nonrheumatic aortic (valve) stenosis: Secondary | ICD-10-CM | POA: Diagnosis not present

## 2018-09-09 DIAGNOSIS — N200 Calculus of kidney: Secondary | ICD-10-CM | POA: Insufficient documentation

## 2018-09-09 DIAGNOSIS — Z7982 Long term (current) use of aspirin: Secondary | ICD-10-CM | POA: Insufficient documentation

## 2018-09-09 DIAGNOSIS — Z01812 Encounter for preprocedural laboratory examination: Secondary | ICD-10-CM

## 2018-09-09 DIAGNOSIS — R531 Weakness: Secondary | ICD-10-CM | POA: Insufficient documentation

## 2018-09-09 DIAGNOSIS — D693 Immune thrombocytopenic purpura: Secondary | ICD-10-CM

## 2018-09-09 DIAGNOSIS — E785 Hyperlipidemia, unspecified: Secondary | ICD-10-CM | POA: Insufficient documentation

## 2018-09-09 DIAGNOSIS — M5136 Other intervertebral disc degeneration, lumbar region: Secondary | ICD-10-CM

## 2018-09-09 DIAGNOSIS — E538 Deficiency of other specified B group vitamins: Secondary | ICD-10-CM

## 2018-09-09 DIAGNOSIS — I10 Essential (primary) hypertension: Secondary | ICD-10-CM

## 2018-09-09 DIAGNOSIS — D649 Anemia, unspecified: Secondary | ICD-10-CM | POA: Diagnosis not present

## 2018-09-09 DIAGNOSIS — K219 Gastro-esophageal reflux disease without esophagitis: Secondary | ICD-10-CM

## 2018-09-09 DIAGNOSIS — N183 Chronic kidney disease, stage 3 (moderate): Secondary | ICD-10-CM | POA: Diagnosis not present

## 2018-09-09 DIAGNOSIS — Z85020 Personal history of malignant carcinoid tumor of stomach: Secondary | ICD-10-CM | POA: Insufficient documentation

## 2018-09-09 DIAGNOSIS — D631 Anemia in chronic kidney disease: Secondary | ICD-10-CM | POA: Diagnosis not present

## 2018-09-09 DIAGNOSIS — C160 Malignant neoplasm of cardia: Secondary | ICD-10-CM

## 2018-09-09 DIAGNOSIS — C3431 Malignant neoplasm of lower lobe, right bronchus or lung: Secondary | ICD-10-CM | POA: Diagnosis present

## 2018-09-09 DIAGNOSIS — R5383 Other fatigue: Secondary | ICD-10-CM | POA: Insufficient documentation

## 2018-09-09 DIAGNOSIS — E1122 Type 2 diabetes mellitus with diabetic chronic kidney disease: Secondary | ICD-10-CM | POA: Insufficient documentation

## 2018-09-09 DIAGNOSIS — M129 Arthropathy, unspecified: Secondary | ICD-10-CM

## 2018-09-09 LAB — CBC WITH DIFFERENTIAL/PLATELET
Abs Immature Granulocytes: 0.01 10*3/uL (ref 0.00–0.07)
Basophils Absolute: 0 10*3/uL (ref 0.0–0.1)
Basophils Relative: 0 %
Eosinophils Absolute: 0 10*3/uL (ref 0.0–0.5)
Eosinophils Relative: 2 %
HCT: 29.7 % — ABNORMAL LOW (ref 36.0–46.0)
Hemoglobin: 9.6 g/dL — ABNORMAL LOW (ref 12.0–15.0)
Immature Granulocytes: 0 %
Lymphocytes Relative: 29 %
Lymphs Abs: 0.8 10*3/uL (ref 0.7–4.0)
MCH: 30.5 pg (ref 26.0–34.0)
MCHC: 32.3 g/dL (ref 30.0–36.0)
MCV: 94.3 fL (ref 80.0–100.0)
Monocytes Absolute: 0.2 10*3/uL (ref 0.1–1.0)
Monocytes Relative: 8 %
Neutro Abs: 1.6 10*3/uL — ABNORMAL LOW (ref 1.7–7.7)
Neutrophils Relative %: 61 %
Platelets: 52 10*3/uL — ABNORMAL LOW (ref 150–400)
RBC: 3.15 MIL/uL — ABNORMAL LOW (ref 3.87–5.11)
RDW: 12.7 % (ref 11.5–15.5)
WBC: 2.7 10*3/uL — ABNORMAL LOW (ref 4.0–10.5)
nRBC: 0 % (ref 0.0–0.2)

## 2018-09-09 LAB — COMPREHENSIVE METABOLIC PANEL
ALT: 12 U/L (ref 0–44)
AST: 17 U/L (ref 15–41)
Albumin: 4.2 g/dL (ref 3.5–5.0)
Alkaline Phosphatase: 50 U/L (ref 38–126)
Anion gap: 9 (ref 5–15)
BUN: 22 mg/dL (ref 8–23)
CO2: 24 mmol/L (ref 22–32)
Calcium: 8.8 mg/dL — ABNORMAL LOW (ref 8.9–10.3)
Chloride: 110 mmol/L (ref 98–111)
Creatinine, Ser: 1.72 mg/dL — ABNORMAL HIGH (ref 0.44–1.00)
GFR calc Af Amer: 32 mL/min — ABNORMAL LOW (ref >60)
GFR calc non Af Amer: 28 mL/min — ABNORMAL LOW (ref >60)
Glucose, Bld: 88 mg/dL (ref 70–99)
Potassium: 3.5 mmol/L (ref 3.5–5.1)
Sodium: 143 mmol/L (ref 135–145)
Total Bilirubin: 1 mg/dL (ref 0.3–1.2)
Total Protein: 6.4 g/dL — ABNORMAL LOW (ref 6.5–8.1)

## 2018-09-09 NOTE — Telephone Encounter (Signed)
Contacted preadmit testing to coordinate pt's thoracentesis. Patient will require covid testing prior to the procedure. Spoke with ashely at preadmit. Explained the need for a stat thoracentesis due to shortness of breath/pleural effusion. Per Caryl Pina,  Patient is eligible for rapid covid screen. Hand off of care provided. apts arranged for thoracentesis for Friday 6/5. Arrival time at 2pm for a 2:30 pm apt.   Pt contacted with the above apt information. She gave verbal understanding to report to the [facility] preadmit testing drive through location for covid-19 testing. She understands that she will need to self quarantine until s/p her procedure. Teach back process performed with patient.

## 2018-09-09 NOTE — Assessment & Plan Note (Addendum)
#  Stage IV recurrent adenocarcinoma the lung; EGFR mutated. On  osimertinib 80 mg [Feb 6th 2020].  May 30th 2020 CT scan- worsening/ increasing lobulated pleural effusion; [previously FDG non-avid]; clinically stable.  # continue osimertinib for now.  I am surprised that patients pleural effusion [presumably malignant] continues to worsen while on osimertinib.  #Right-sided pleural effusion-recommend thoracentesis.  Discussed regarding pleurodesis as an option; however this has to be weighed in the context of patient's other comorbidities/hospitalization etc.  Will discuss with Dr. Faith Rogue.  #  CKD [staghorn calculus]-III-Stable.  Awaiting neprho eval in  June 2020.   # Moderate anemia hemoglobin 9-10-malabsorption/CKD.  Stable.   # ITP/osimertinib-chronic however today platelets 58 sec to osimertinib monitor closely. Stable.   # CAD s/p stents [[place]] on plavix /aspirin; critical aortic stenosis-holding off TAVR procedure.s stable  #Elevated blood pressure systolic 166A/[id number] hypertension asymptomatic.    # DISPOSITION:  # right sided thoracentesis asap # follow up in 4 weeks--MD -labs/cbc/cmp-Dr.B  Cc; PCP/cards.
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
## 2018-09-10 ENCOUNTER — Other Ambulatory Visit
Admission: RE | Admit: 2018-09-10 | Discharge: 2018-09-10 | Disposition: A | Discharge status: Home / Self Care | Payer: Medicare Other | Source: Ambulatory Visit | Attending: Internal Medicine | Admitting: Internal Medicine

## 2018-09-10 DIAGNOSIS — Z1159 Encounter for screening for other viral diseases: Secondary | ICD-10-CM | POA: Diagnosis present

## 2018-09-10 LAB — SARS CORONAVIRUS 2 BY RT PCR (HOSPITAL ORDER, PERFORMED IN ~~LOC~~ HOSPITAL LAB): SARS Coronavirus 2: NEGATIVE

## 2018-09-11 ENCOUNTER — Encounter: Payer: Self-pay | Admitting: *Deleted

## 2018-09-11 NOTE — Progress Notes (Signed)
  Oncology Nurse Navigator Documentation  Navigator Location: [facility] (09/11/18 0800)   )Navigator Encounter Type: Telephone (09/11/18 0800) Telephone: Stacy Cook Call (09/11/18 0800)                       Barriers/Navigation Needs: Coordination of Care (09/11/18 0800)   Interventions: Coordination of Care (09/11/18 0800)   Coordination of Care: Appts (09/11/18 0800)         Called pt to notify that she has been scheduled to see Dr. Genevive Bi on Friday 6/5 at 8:30am at [facility] to discuss pleurodesis. All questions answered during phone call. Pt verbalized understanding. Nothing further needed at this time.         Time Spent with Patient: 30 (09/11/18 0800)

## 2018-09-12 ENCOUNTER — Ambulatory Visit
Admission: RE | Admit: 2018-09-12 | Discharge: 2018-09-12 | Disposition: A | Discharge status: Home / Self Care | Payer: Medicare Other | Source: Ambulatory Visit | Attending: Interventional Radiology | Admitting: Interventional Radiology

## 2018-09-12 ENCOUNTER — Ambulatory Visit
Admission: RE | Admit: 2018-09-12 | Discharge: 2018-09-12 | Disposition: A | Discharge status: Home / Self Care | Payer: Medicare Other | Source: Ambulatory Visit | Attending: Internal Medicine | Admitting: Internal Medicine

## 2018-09-12 ENCOUNTER — Ambulatory Visit: Payer: Medicare Other | Admitting: Cardiothoracic Surgery

## 2018-09-12 ENCOUNTER — Other Ambulatory Visit: Payer: Self-pay

## 2018-09-12 ENCOUNTER — Encounter: Payer: Self-pay | Admitting: Cardiothoracic Surgery

## 2018-09-12 VITALS — BP 197/65 | HR 69 | Temp 97.1°F | Ht 60.0 in | Wt 119.0 lb

## 2018-09-12 DIAGNOSIS — J90 Pleural effusion, not elsewhere classified: Secondary | ICD-10-CM | POA: Diagnosis not present

## 2018-09-12 DIAGNOSIS — C3431 Malignant neoplasm of lower lobe, right bronchus or lung: Secondary | ICD-10-CM

## 2018-09-12 DIAGNOSIS — Z9889 Other specified postprocedural states: Secondary | ICD-10-CM | POA: Insufficient documentation

## 2018-09-12 LAB — GLUCOSE, PLEURAL OR PERITONEAL FLUID: Glucose, Fluid: 93 mg/dL

## 2018-09-12 LAB — LACTATE DEHYDROGENASE, PLEURAL OR PERITONEAL FLUID: LD, Fluid: 86 U/L — ABNORMAL HIGH (ref 3–23)

## 2018-09-12 LAB — BODY FLUID CELL COUNT WITH DIFFERENTIAL
Eos, Fluid: 0 %
Lymphs, Fluid: 84 %
Monocyte-Macrophage-Serous Fluid: 12 %
Neutrophil Count, Fluid: 4 %
Total Nucleated Cell Count, Fluid: 170 uL

## 2018-09-12 LAB — PROTEIN, PLEURAL OR PERITONEAL FLUID: Total protein, fluid: <3 g/dL

## 2018-09-12 NOTE — Patient Instructions (Signed)
Patient to call back and report after she gets drained today. Let us know how you feel.

## 2018-09-12 NOTE — Procedures (Signed)
Interventional Radiology Procedure Note  Procedure: US guided right thoracentesis  Complications: None  Estimated Blood Loss: None  Findings: 600 mL of yellow fluid removed from right pleural space. Post CXR pending.  Venetia Night. Kathlene Cote, M.D Pager:  [phone]
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
## 2018-09-14 LAB — PROTEIN, BODY FLUID (OTHER): Total Protein, Body Fluid Other: 2.7 g/dL

## 2018-09-15 ENCOUNTER — Telehealth: Payer: Self-pay | Admitting: *Deleted

## 2018-09-15 LAB — PH, BODY FLUID: pH, Body Fluid: 7.7

## 2018-09-15 NOTE — Telephone Encounter (Signed)
Patient called and wanted let you know that she is not feeling any better, she is still exhausted and her breathing is the same. She wanted me to let you know that you was right!

## 2018-09-16 LAB — BODY FLUID CULTURE: Culture: NO GROWTH

## 2018-09-16 LAB — CYTOLOGY - NON PAP

## 2018-09-23 ENCOUNTER — Other Ambulatory Visit: Payer: Self-pay | Admitting: Cardiovascular Disease

## 2018-09-26 ENCOUNTER — Other Ambulatory Visit: Payer: Self-pay | Admitting: Cardiovascular Disease

## 2018-10-06 ENCOUNTER — Other Ambulatory Visit: Payer: Self-pay

## 2018-10-06 ENCOUNTER — Other Ambulatory Visit: Payer: Self-pay | Admitting: *Deleted

## 2018-10-06 DIAGNOSIS — C160 Malignant neoplasm of cardia: Secondary | ICD-10-CM

## 2018-10-07 ENCOUNTER — Inpatient Hospital Stay: Payer: Medicare Other

## 2018-10-07 ENCOUNTER — Inpatient Hospital Stay (HOSPITAL_BASED_OUTPATIENT_CLINIC_OR_DEPARTMENT_OTHER): Payer: Medicare Other | Admitting: Internal Medicine

## 2018-10-07 ENCOUNTER — Telehealth: Payer: Self-pay | Admitting: Cardiovascular Disease

## 2018-10-07 ENCOUNTER — Other Ambulatory Visit: Payer: Self-pay

## 2018-10-07 DIAGNOSIS — M129 Arthropathy, unspecified: Secondary | ICD-10-CM

## 2018-10-07 DIAGNOSIS — I129 Hypertensive chronic kidney disease with stage 1 through stage 4 chronic kidney disease, or unspecified chronic kidney disease: Secondary | ICD-10-CM | POA: Diagnosis not present

## 2018-10-07 DIAGNOSIS — C3431 Malignant neoplasm of lower lobe, right bronchus or lung: Secondary | ICD-10-CM | POA: Diagnosis not present

## 2018-10-07 DIAGNOSIS — D631 Anemia in chronic kidney disease: Secondary | ICD-10-CM

## 2018-10-07 DIAGNOSIS — N183 Chronic kidney disease, stage 3 (moderate): Secondary | ICD-10-CM | POA: Diagnosis not present

## 2018-10-07 DIAGNOSIS — E1122 Type 2 diabetes mellitus with diabetic chronic kidney disease: Secondary | ICD-10-CM

## 2018-10-07 DIAGNOSIS — Z79899 Other long term (current) drug therapy: Secondary | ICD-10-CM

## 2018-10-07 DIAGNOSIS — R5383 Other fatigue: Secondary | ICD-10-CM

## 2018-10-07 DIAGNOSIS — D693 Immune thrombocytopenic purpura: Secondary | ICD-10-CM

## 2018-10-07 DIAGNOSIS — I2510 Atherosclerotic heart disease of native coronary artery without angina pectoris: Secondary | ICD-10-CM

## 2018-10-07 DIAGNOSIS — C160 Malignant neoplasm of cardia: Secondary | ICD-10-CM

## 2018-10-07 DIAGNOSIS — R0602 Shortness of breath: Secondary | ICD-10-CM

## 2018-10-07 DIAGNOSIS — I6529 Occlusion and stenosis of unspecified carotid artery: Secondary | ICD-10-CM

## 2018-10-07 DIAGNOSIS — I10 Essential (primary) hypertension: Secondary | ICD-10-CM

## 2018-10-07 DIAGNOSIS — M5136 Other intervertebral disc degeneration, lumbar region: Secondary | ICD-10-CM

## 2018-10-07 DIAGNOSIS — Z85020 Personal history of malignant carcinoid tumor of stomach: Secondary | ICD-10-CM

## 2018-10-07 DIAGNOSIS — E785 Hyperlipidemia, unspecified: Secondary | ICD-10-CM

## 2018-10-07 DIAGNOSIS — J90 Pleural effusion, not elsewhere classified: Secondary | ICD-10-CM | POA: Diagnosis not present

## 2018-10-07 DIAGNOSIS — D649 Anemia, unspecified: Secondary | ICD-10-CM

## 2018-10-07 DIAGNOSIS — I350 Nonrheumatic aortic (valve) stenosis: Secondary | ICD-10-CM

## 2018-10-07 DIAGNOSIS — N200 Calculus of kidney: Secondary | ICD-10-CM

## 2018-10-07 DIAGNOSIS — E538 Deficiency of other specified B group vitamins: Secondary | ICD-10-CM

## 2018-10-07 DIAGNOSIS — R531 Weakness: Secondary | ICD-10-CM

## 2018-10-07 DIAGNOSIS — Z7982 Long term (current) use of aspirin: Secondary | ICD-10-CM

## 2018-10-07 DIAGNOSIS — K219 Gastro-esophageal reflux disease without esophagitis: Secondary | ICD-10-CM

## 2018-10-07 LAB — CBC WITH DIFFERENTIAL/PLATELET
Abs Immature Granulocytes: 0.01 10*3/uL (ref 0.00–0.07)
Basophils Absolute: 0 10*3/uL (ref 0.0–0.1)
Basophils Relative: 0 %
Eosinophils Absolute: 0.1 10*3/uL (ref 0.0–0.5)
Eosinophils Relative: 2 %
HCT: 27 % — ABNORMAL LOW (ref 36.0–46.0)
Hemoglobin: 8.7 g/dL — ABNORMAL LOW (ref 12.0–15.0)
Immature Granulocytes: 0 %
Lymphocytes Relative: 29 %
Lymphs Abs: 0.7 10*3/uL (ref 0.7–4.0)
MCH: 29.8 pg (ref 26.0–34.0)
MCHC: 32.2 g/dL (ref 30.0–36.0)
MCV: 92.5 fL (ref 80.0–100.0)
Monocytes Absolute: 0.2 10*3/uL (ref 0.1–1.0)
Monocytes Relative: 7 %
Neutro Abs: 1.5 10*3/uL — ABNORMAL LOW (ref 1.7–7.7)
Neutrophils Relative %: 62 %
Platelets: 45 10*3/uL — ABNORMAL LOW (ref 150–400)
RBC: 2.92 MIL/uL — ABNORMAL LOW (ref 3.87–5.11)
RDW: 12.8 % (ref 11.5–15.5)
WBC: 2.5 10*3/uL — ABNORMAL LOW (ref 4.0–10.5)
nRBC: 0 % (ref 0.0–0.2)

## 2018-10-07 LAB — COMPREHENSIVE METABOLIC PANEL
ALT: 13 U/L (ref 0–44)
AST: 16 U/L (ref 15–41)
Albumin: 4 g/dL (ref 3.5–5.0)
Alkaline Phosphatase: 48 U/L (ref 38–126)
Anion gap: 7 (ref 5–15)
BUN: 19 mg/dL (ref 8–23)
CO2: 25 mmol/L (ref 22–32)
Calcium: 8.5 mg/dL — ABNORMAL LOW (ref 8.9–10.3)
Chloride: 110 mmol/L (ref 98–111)
Creatinine, Ser: 1.39 mg/dL — ABNORMAL HIGH (ref 0.44–1.00)
GFR calc Af Amer: 41 mL/min — ABNORMAL LOW (ref >60)
GFR calc non Af Amer: 36 mL/min — ABNORMAL LOW (ref >60)
Glucose, Bld: 82 mg/dL (ref 70–99)
Potassium: 3.7 mmol/L (ref 3.5–5.1)
Sodium: 142 mmol/L (ref 135–145)
Total Bilirubin: 1.1 mg/dL (ref 0.3–1.2)
Total Protein: 6.3 g/dL — ABNORMAL LOW (ref 6.5–8.1)

## 2018-10-07 MED ORDER — TRIAMCINOLONE ACETONIDE 0.5 % EX OINT: 1.0000 "application " | TOPICAL_OINTMENT | Freq: Two times a day (BID) | CUTANEOUS | 0 refills | Status: DC
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
## 2018-10-07 NOTE — Telephone Encounter (Signed)
New Message              Dr Rogue Bussing office is calling to see if the patient needs to continue taking  Plavix and Imdur, patient states nothing has being called and she wants to know if she should continue taken the  2 Rx's if so pls send to  [facility]

## 2018-10-07 NOTE — Telephone Encounter (Signed)
Dr. Angelena Form the pt went to see Dr. Rogue Bussing today and while going over her med list, she was unsure if she was to continue taking her Plavix and Imdur, and if so, she will require a new refill of each.  I do not see a discontinuation order from you on either one of these meds, but its been awhile since the pt has followed with you. She had PCI on 10/15/17, and was discharged from hospital on Plavix and ASA.  Do you want her to continue these meds, and if so, is it ok to refill? Please advise.

## 2018-10-07 NOTE — Progress Notes (Signed)
Patient reports having episodes of nausea and diarrhea but she is able to manage without medications.  States she is our of her Plavix since Sunday and was assuming Dr. Angelena Form (cardiologist at [facility]) did not want her to continue since they have not sent refill to pharmacy.  Also is running low on Imdur and is assuming not to continue that as well.  I have called Dr. Angelena Form office inforning them that patient is needing refills if she is to continue medications.  Receptionist is going to send a detailed message to MD.

## 2018-10-07 NOTE — Assessment & Plan Note (Addendum)
#  Stage IV recurrent adenocarcinoma the lung; EGFR mutated. On  osimertinib 80 mg [Feb 6th 2020].  May 30th 2020 CT scan- worsening/ increasing lobulated pleural effusion; [previously FDG non-avid].  Clinically stable.  #Given the slow but recurrent malignant effusion-I am concerned about suboptimal response to osimertinib so far.  Had a long discussion the patient that in general would expect about 60 to 70% response rates; responses in her case are disappointing.  Discussed other options during chemotherapy/immunotherapy-again that has to be weighed in the context of her overall comorbidities/age etc.  For now we will proceed with repeat CT scan in a month from now to assess the malignancy.  Continued progression noted-change her therapy was recommended.  See discussion below regarding patient's clearance for TAVR for severe aortic stenosis.  #Paronychia right third finger-secondary to osimertinib-recommend Epson salt baths; and also recommend Kenalog ointment.  Prescription sent.  #Right-sided pleural effusion-status post thoracentesis x2.  Evaluated by Dr. Faith Rogue.   #  CKD [staghorn calculus]-III-stable  # Moderate anemia hemoglobin 9-10-malabsorption/CKD.  Stable  # ITP/osimertinib-chronic however today platelets 45-worsened by osimertinib; if cannot get worse would recommend addition of Promacta.  # CAD s/p stents [[place]] on plavix /aspirin; critical aortic stenosis-holding off TAVR procedure given the poorly controlled lung malignancy.  Patient is unfortunately quite frustrated as she is wanting to have TAVR procedure done.  However, given the suboptimal response to current therapy I am concerned about progressive lung cancer also.  Await repeat imaging in a month.  Previously discussed with Theodosia Quay.    # DISPOSITION:  # follow up in 81monthMD -labs/cbc/cmp; CT chest scan prior-Dr.B  Cc; PCP/cards.

## 2018-10-08 MED ORDER — ISOSORBIDE MONONITRATE ER 30 MG PO TB24: 30.0000 mg | ORAL_TABLET | Freq: Every day | ORAL | 2 refills | Status: DC

## 2018-10-08 MED ORDER — CLOPIDOGREL BISULFATE 75 MG PO TABS: 75.0000 mg | ORAL_TABLET | Freq: Every day | ORAL | 2 refills | Status: DC

## 2018-10-08 NOTE — Telephone Encounter (Signed)
OK to refill. cdm

## 2018-10-08 NOTE — Telephone Encounter (Signed)
Prescriptions sent to pharmacy. I placed call to pt to let her know she should continue these medications.  Left message to call office.

## 2018-10-09 NOTE — Telephone Encounter (Signed)
Pt.notified

## 2018-10-29 ENCOUNTER — Other Ambulatory Visit: Payer: Self-pay

## 2018-10-29 ENCOUNTER — Ambulatory Visit
Admission: RE | Admit: 2018-10-29 | Discharge: 2018-10-29 | Disposition: A | Discharge status: Home / Self Care | Payer: Medicare Other | Source: Ambulatory Visit | Attending: Internal Medicine | Admitting: Internal Medicine

## 2018-10-29 DIAGNOSIS — C3431 Malignant neoplasm of lower lobe, right bronchus or lung: Secondary | ICD-10-CM | POA: Diagnosis present

## 2018-11-03 ENCOUNTER — Other Ambulatory Visit: Payer: Self-pay | Admitting: *Deleted

## 2018-11-03 DIAGNOSIS — C160 Malignant neoplasm of cardia: Secondary | ICD-10-CM

## 2018-11-04 ENCOUNTER — Other Ambulatory Visit: Payer: Self-pay

## 2018-11-04 ENCOUNTER — Inpatient Hospital Stay (HOSPITAL_BASED_OUTPATIENT_CLINIC_OR_DEPARTMENT_OTHER): Payer: Medicare Other | Admitting: Internal Medicine

## 2018-11-04 ENCOUNTER — Inpatient Hospital Stay: Payer: Medicare Other | Attending: Internal Medicine

## 2018-11-04 DIAGNOSIS — K219 Gastro-esophageal reflux disease without esophagitis: Secondary | ICD-10-CM | POA: Diagnosis not present

## 2018-11-04 DIAGNOSIS — E1122 Type 2 diabetes mellitus with diabetic chronic kidney disease: Secondary | ICD-10-CM | POA: Insufficient documentation

## 2018-11-04 DIAGNOSIS — C160 Malignant neoplasm of cardia: Secondary | ICD-10-CM

## 2018-11-04 DIAGNOSIS — E538 Deficiency of other specified B group vitamins: Secondary | ICD-10-CM

## 2018-11-04 DIAGNOSIS — D693 Immune thrombocytopenic purpura: Secondary | ICD-10-CM

## 2018-11-04 DIAGNOSIS — Z79899 Other long term (current) drug therapy: Secondary | ICD-10-CM

## 2018-11-04 DIAGNOSIS — N189 Chronic kidney disease, unspecified: Secondary | ICD-10-CM | POA: Diagnosis not present

## 2018-11-04 DIAGNOSIS — R531 Weakness: Secondary | ICD-10-CM | POA: Diagnosis not present

## 2018-11-04 DIAGNOSIS — I2510 Atherosclerotic heart disease of native coronary artery without angina pectoris: Secondary | ICD-10-CM | POA: Diagnosis not present

## 2018-11-04 DIAGNOSIS — I350 Nonrheumatic aortic (valve) stenosis: Secondary | ICD-10-CM | POA: Insufficient documentation

## 2018-11-04 DIAGNOSIS — R5383 Other fatigue: Secondary | ICD-10-CM | POA: Insufficient documentation

## 2018-11-04 DIAGNOSIS — M129 Arthropathy, unspecified: Secondary | ICD-10-CM | POA: Diagnosis not present

## 2018-11-04 DIAGNOSIS — Z85020 Personal history of malignant carcinoid tumor of stomach: Secondary | ICD-10-CM

## 2018-11-04 DIAGNOSIS — M5136 Other intervertebral disc degeneration, lumbar region: Secondary | ICD-10-CM

## 2018-11-04 DIAGNOSIS — I129 Hypertensive chronic kidney disease with stage 1 through stage 4 chronic kidney disease, or unspecified chronic kidney disease: Secondary | ICD-10-CM | POA: Diagnosis not present

## 2018-11-04 DIAGNOSIS — C3431 Malignant neoplasm of lower lobe, right bronchus or lung: Secondary | ICD-10-CM

## 2018-11-04 DIAGNOSIS — D500 Iron deficiency anemia secondary to blood loss (chronic): Secondary | ICD-10-CM | POA: Diagnosis not present

## 2018-11-04 DIAGNOSIS — R0602 Shortness of breath: Secondary | ICD-10-CM | POA: Diagnosis not present

## 2018-11-04 DIAGNOSIS — E785 Hyperlipidemia, unspecified: Secondary | ICD-10-CM

## 2018-11-04 DIAGNOSIS — K909 Intestinal malabsorption, unspecified: Secondary | ICD-10-CM | POA: Diagnosis not present

## 2018-11-04 LAB — COMPREHENSIVE METABOLIC PANEL
ALT: 12 U/L (ref 0–44)
AST: 14 U/L — ABNORMAL LOW (ref 15–41)
Albumin: 4.2 g/dL (ref 3.5–5.0)
Alkaline Phosphatase: 54 U/L (ref 38–126)
Anion gap: 9 (ref 5–15)
BUN: 19 mg/dL (ref 8–23)
CO2: 24 mmol/L (ref 22–32)
Calcium: 8.9 mg/dL (ref 8.9–10.3)
Chloride: 109 mmol/L (ref 98–111)
Creatinine, Ser: 1.43 mg/dL — ABNORMAL HIGH (ref 0.44–1.00)
GFR calc Af Amer: 40 mL/min — ABNORMAL LOW (ref >60)
GFR calc non Af Amer: 35 mL/min — ABNORMAL LOW (ref >60)
Glucose, Bld: 83 mg/dL (ref 70–99)
Potassium: 4.1 mmol/L (ref 3.5–5.1)
Sodium: 142 mmol/L (ref 135–145)
Total Bilirubin: 1 mg/dL (ref 0.3–1.2)
Total Protein: 6.5 g/dL (ref 6.5–8.1)

## 2018-11-04 LAB — CBC WITH DIFFERENTIAL/PLATELET
Abs Immature Granulocytes: 0.01 10*3/uL (ref 0.00–0.07)
Basophils Absolute: 0 10*3/uL (ref 0.0–0.1)
Basophils Relative: 0 %
Eosinophils Absolute: 0.1 10*3/uL (ref 0.0–0.5)
Eosinophils Relative: 3 %
HCT: 28.5 % — ABNORMAL LOW (ref 36.0–46.0)
Hemoglobin: 9.4 g/dL — ABNORMAL LOW (ref 12.0–15.0)
Immature Granulocytes: 0 %
Lymphocytes Relative: 30 %
Lymphs Abs: 0.9 10*3/uL (ref 0.7–4.0)
MCH: 30.2 pg (ref 26.0–34.0)
MCHC: 33 g/dL (ref 30.0–36.0)
MCV: 91.6 fL (ref 80.0–100.0)
Monocytes Absolute: 0.2 10*3/uL (ref 0.1–1.0)
Monocytes Relative: 7 %
Neutro Abs: 1.8 10*3/uL (ref 1.7–7.7)
Neutrophils Relative %: 60 %
Platelets: 55 10*3/uL — ABNORMAL LOW (ref 150–400)
RBC: 3.11 MIL/uL — ABNORMAL LOW (ref 3.87–5.11)
RDW: 13.2 % (ref 11.5–15.5)
WBC: 3.1 10*3/uL — ABNORMAL LOW (ref 4.0–10.5)
nRBC: 0 % (ref 0.0–0.2)
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
## 2018-11-04 NOTE — Progress Notes (Signed)
Pt reports blood in stool 3 times this week, new for her. She had CT scan done last week of head/hips. No other issues at this time.

## 2018-11-04 NOTE — Assessment & Plan Note (Addendum)
#  Stage IV recurrent adenocarcinoma the lung; EGFR mutated. On  osimertinib 80 mg [Feb 6th 2020].  October 31 2018 CT scan [compared to imaging in March 2020] worsening/ increasing lobulated pleural effusion; [previously FDG non-avid-cytology positive x2].  #I had a long discussion with the patient regarding the disappointing results of CT scan without any significant response being on osimertinib close to 6 months.  Discussed other options including addition of Avastin current therapy/chemoimmunotherapy/immunotherapy-however all at a significant risk for potential side effects given patient's-platelet counts 40s to 50s/CKD etc.[See below].  In terms of life expectancy-in my clinical judgment more than 6 months.  #Severe aortic stenosis-awaiting TAVR.  Discussed with the patient that although cancer has not regressed; at the same time it has not gotten significantly worse-and it would be reasonable if TAVR could be performed to help her symptoms of aortic stenosis at this time.  I will reach out to cardiology team.  #  CKD [staghorn calculus]-III-stable  # Moderate anemia hemoglobin 9-10-malabsorption/CKD.  Stable  # ITP/osimertinib-chronic however today platelets 55-worsened by osimertinib; currently stable.  Continues to get worse would recommend addition of Promacta.   # DISPOSITION:  # follow up to be decided- Dr.B  # 40 minutes face-to-face with the patient discussing the above plan of care; more than 50% of time spent on prognosis/ natural history; counseling and coordination.  # I reviewed the blood work- with the patient in detail; also reviewed the imaging independently [as summarized above]; and with the patient in detail.

## 2018-11-06 ENCOUNTER — Other Ambulatory Visit: Payer: Self-pay

## 2018-11-06 ENCOUNTER — Telehealth: Payer: Self-pay | Admitting: Internal Medicine

## 2018-11-06 DIAGNOSIS — C160 Malignant neoplasm of cardia: Secondary | ICD-10-CM

## 2018-11-06 DIAGNOSIS — I350 Nonrheumatic aortic (valve) stenosis: Secondary | ICD-10-CM

## 2018-11-06 NOTE — Telephone Encounter (Signed)
Spoke to patient regarding my discussion with cardiology; Stacy Cook.  Patient has appointment with cardiology/echo next week.  #Please have the patient follow-up with me in 1 month/CBC CMP.

## 2018-11-06 NOTE — Progress Notes (Signed)
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 intolerant.   Current medicines are reviewed at length with the patient today.  The patient does not have concerns regarding medicines.  The following changes have been made:  no change  Labs/ tests ordered today include:   No orders of the defined types were placed in this encounter.  Disposition:   Follow up with the valve team.   Signed, Lauree Chandler, MD 11/10/2018 8:44 AM    [place] [place], [place], [place]  [address] Phone: [phone]; Fax: [phone]

## 2018-11-07 ENCOUNTER — Telehealth: Payer: Self-pay

## 2018-11-07 NOTE — Addendum Note (Signed)
Addended by: Sabino Gasser on: 11/07/2018 09:53 AM   Modules accepted: Orders

## 2018-11-07 NOTE — Telephone Encounter (Signed)

## 2018-11-10 ENCOUNTER — Encounter: Payer: Self-pay | Admitting: Cardiovascular Disease

## 2018-11-10 ENCOUNTER — Ambulatory Visit (INDEPENDENT_AMBULATORY_CARE_PROVIDER_SITE_OTHER): Payer: Medicare Other | Admitting: Cardiovascular Disease

## 2018-11-10 ENCOUNTER — Other Ambulatory Visit: Payer: Self-pay

## 2018-11-10 ENCOUNTER — Ambulatory Visit (HOSPITAL_COMMUNITY): Payer: Medicare Other | Attending: Cardiology

## 2018-11-10 VITALS — BP 186/60 | HR 61 | Ht 60.0 in | Wt 113.4 lb

## 2018-11-10 DIAGNOSIS — I350 Nonrheumatic aortic (valve) stenosis: Secondary | ICD-10-CM | POA: Diagnosis present

## 2018-11-10 NOTE — Patient Instructions (Signed)
Medication Instructions:  Your physician recommends that you continue on your current medications as directed. Please refer to the Current Medication list given to you today.  If you need a refill on your cardiac medications before your next appointment, please call your pharmacy.   Lab work: None   If you have labs (blood work) drawn today and your tests are completely normal, you will receive your results only by: Marland Kitchen MyChart Message (if you have MyChart) OR . A paper copy in the mail If you have any lab test that is abnormal or we need to change your treatment, we will call you to review the results.  Testing/Procedures: None    Any Other Special Instructions Will Be Listed Below (If Applicable).

## 2018-11-12 ENCOUNTER — Other Ambulatory Visit: Payer: Self-pay

## 2018-11-12 ENCOUNTER — Ambulatory Visit: Payer: Medicare Other | Admitting: Surgery

## 2018-11-12 ENCOUNTER — Encounter: Payer: Self-pay | Admitting: Surgery

## 2018-11-12 VITALS — BP 165/54 | HR 64 | Resp 20 | Ht 60.0 in | Wt 113.0 lb

## 2018-11-12 DIAGNOSIS — I350 Nonrheumatic aortic (valve) stenosis: Secondary | ICD-10-CM

## 2018-11-12 NOTE — Progress Notes (Signed)
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  She initially presented with severe aortic stenosis and June 2019 and underwent cardiac catheterization as part of that work-up showing severe three-vessel coronary disease.  She was seen by Dr. Angelena Form and subsequent underwent PCI with a drug-eluting stent in the distal right coronary artery and orbital atherectomy with a drug-eluting stent in the mid left circumflex.  Her chest pain improved after PCI and she was maintained on aspirin Plavix with plans to consider TAVR after she recovered.  She had a follow-up echocardiogram in October 2019 which showed normal left ventricular ejection fraction of 65 to 70% with severe aortic stenosis and a mean gradient across aortic valve of 54 mmHg.  During her work-up for TAVR she had a CT angios of the chest on 02/10/2018 which showed a new small dependent right pleural effusion with increased irregular pleural thickening and nodularity with a dominant 1.5 cm peripheral basilar right pleural nodule.  A PET scan at that time showed only low-grade activity within the right lower hemithorax with a maximum SUV of 2.6.  There was a borderline enlarged right hilar lymph node with a maximum SUV of 3.2.  There was concern about the possibility of recurrent metastatic lung cancer and she underwent a right thoracentesis which was positive for malignant cells.  She was started on oral chemotherapy by Dr. Rogue Bussing in January 2020.  A follow-up CT scan of the chest on 06/30/2018 showed some increase in the loculated right pleural effusion compared to the  previous exam.  There was a focal area of previously non-hypermetabolic increased soft tissue along the anteromedial right lower lung that was mildly increased compared to the prior exam.  She was continued on her chemotherapy and underwent a follow-up PET scan in May 2020 which showed no evidence of lung cancer recurrence.  There was a persistent increase in the partially loculated right pleural effusion.  There was a new nonmetabolic rounded mass in the right lower lobe is favored to be rounded atelectasis.  She subsequently underwent repeat thoracentesis on 09/12/2018 removing about 600 cc of yellowish fluid which was positive for malignant cells consistent with her adenocarcinoma of the lung.  Her most recent CT scan of the chest from 10/29/2018 shows a stable partially loculated right pleural effusion which does not appear much different than it did back in May.  She saw Dr. Angelena Form recently and had a repeat echocardiogram on 11/10/2018 that continue to show severe aortic stenosis with a mean gradient of 45 mmHg across aortic valve and normal left ventricular systolic function.  She continues to have significant dyspnea on exertion as well as some fatigue.  She denies any chest pain or pressure.  She has had no dizziness or syncope.  She denies lower extremity edema.  She said that she has been eating fairly well but losing some weight.  She recently had some blood in her stool for a few days but that resolved.  She has continued on aspirin and Plavix.  She denies any headaches or visual changes.  She has had no focal neurologic signs.  Despite having all this going on she  has continued to try to be as active as possible.  Current Outpatient Medications  Medication Sig Dispense Refill   aspirin EC 81 MG tablet Take 81 mg by mouth daily.     cholecalciferol (VITAMIN D) 1000 UNITS tablet Take 1,000 Units by mouth daily.     clopidogrel (PLAVIX) 75 MG tablet Take 1 tablet (75 mg total) by mouth daily. 90  tablet 2   cyanocobalamin 1000 MCG tablet Take 1,000 mcg by mouth daily.      ferrous sulfate 325 (65 FE) MG tablet Take 325 mg by mouth daily with breakfast.     isosorbide mononitrate (IMDUR) 30 MG 24 hr tablet Take 1 tablet (30 mg total) by mouth daily. 90 tablet 2   losartan (COZAAR) 100 MG tablet      metoprolol succinate (TOPROL-XL) 50 MG 24 hr tablet      mirtazapine (REMERON) 7.5 MG tablet Take 7.5 mg by mouth at bedtime.     nitroGLYCERIN (NITROSTAT) 0.4 MG SL tablet Place 0.4 mg under the tongue every 5 (five) minutes as needed for chest pain.     osimertinib mesylate (TAGRISSO) 80 MG tablet Take 1 tablet (80 mg total) by mouth daily. 30 tablet 4   pantoprazole (PROTONIX) 40 MG tablet      tizanidine (ZANAFLEX) 2 MG capsule Take 2-4 mg by mouth 2 (two) times daily as needed for muscle spasms.      triamcinolone ointment (KENALOG) 0.5 % Apply 1 application topically 2 (two) times daily. 30 g 0   No current facility-administered medications for this visit.      Physical Exam: BP (!) 165/54    Pulse 64    Resp 20    Ht 5' (1.524 m)    Wt 113 lb (51.3 kg)    SpO2 95% Comment: RA   BMI 22.07 kg/m  She has an elderly woman who is in good spirits in no distress.  She is completely independent and ambulatory.  She is very conversant and articulate. Cardiac exam shows a regular rate and rhythm with a 3/6 systolic murmur along the right sternal border.  There is no diastolic murmur. Lung exam reveals decreased breath sounds that are tubular over the right lower hemithorax. There is no peripheral edema.  Diagnostic Tests:   ECHOCARDIOGRAM REPORT       Patient Name:   Stacy Cook Date of Exam: 11/10/2018 Medical Rec #:  [id number]        Height:       60.0 in Accession #:    [id number]       Weight:       114.6 lb Date of Birth:  07-11-1935        BSA:          1.47 m Patient Age:    50 years         BP:           188/61 mmHg Patient Gender: F                HR:            65 bpm. Exam Location:  [facility]    Procedure: 2D Echo, Cardiac Doppler and Color Doppler  Indications:    I35.0 Aortic Stenosis   History:        Patient has prior history of Echocardiogram examinations, most                 recent 01/20/2018. Risk  Factors: Hypertension, Diabetes and                 Dyslipidemia. Murmur.   Sonographer:    Wilford Sports Rodgers-Jones RDCS Referring Phys: [place]    1. The left ventricle has normal systolic function with an ejection fraction of 60-65%. The cavity size was normal. There is moderately increased left ventricular wall thickness. Left ventricular diastolic Doppler parameters are consistent with  pseudonormalization. Elevated mean left atrial pressure.  2. The right ventricle has normal systolic function. The cavity was normal.  3. Left atrial size was moderately dilated.  4. The mitral valve is abnormal. Mild thickening of the mitral valve leaflet. There is moderate mitral annular calcification present.  5. The tricuspid valve is grossly normal.  6. The aortic valve is tricuspid. Severe calcifcation of the aortic valve. Aortic valve regurgitation is mild by color flow Doppler. Severe stenosis of the aortic valve.  7. The aorta is normal in size and structure.  8. Normal LV systolic function; moderate diastolic dysfunction; moderate LVH; calcified aortic valve with severe AS (mean gradient 45 mmHg) and mild AI; mild MR; moderate LAE.  FINDINGS  Left Ventricle: The left ventricle has normal systolic function, with an ejection fraction of 60-65%. The cavity size was normal. There is moderately increased left ventricular wall thickness. Left ventricular diastolic Doppler parameters are consistent  with pseudonormalization. Elevated mean left atrial pressure  Right Ventricle: The right ventricle has normal systolic function. The cavity was normal.  Left Atrium: Left atrial size was moderately  dilated.  Right Atrium: Right atrial size was normal in size.  Interatrial Septum: No atrial level shunt detected by color flow Doppler.  Pericardium: There is no evidence of pericardial effusion.  Mitral Valve: The mitral valve is abnormal. Mild thickening of the mitral valve leaflet. There is moderate mitral annular calcification present. Mitral valve regurgitation is mild by color flow Doppler.  Tricuspid Valve: The tricuspid valve is grossly normal. Tricuspid valve regurgitation is mild by color flow Doppler.  Aortic Valve: The aortic valve is tricuspid Severe calcifcation of the aortic valve. Aortic valve regurgitation is mild by color flow Doppler. There is Severe stenosis of the aortic valve, with a calculated valve area of 0.67 cm.  Pulmonic Valve: The pulmonic valve was grossly normal. Pulmonic valve regurgitation is trivial by color flow Doppler.  Aorta: The aorta is normal in size and structure.  Venous: The inferior vena cava is normal in size with greater than 50% respiratory variability.  Additional Comments: Normal LV systolic function; moderate diastolic dysfunction; moderate LVH; calcified aortic valve with severe AS (mean gradient 45 mmHg) and mild AI; mild MR; moderate LAE.    +--------------+--------++  LEFT VENTRICLE            +----------------+---------++ +--------------+--------++  Diastology                    PLAX 2D                   +----------------+---------++ +--------------+--------++  LV e' lateral:   4.56 cm/s    LVIDd:         4.20 cm    +----------------+---------++ +--------------+--------++  LV E/e' lateral: 30.0         LVIDs:         2.70 cm    +----------------+---------++ +--------------+--------++  LV e' medial:    4.38 cm/s    LV PW:  1.10 cm    +----------------+---------++ +--------------+--------++  LV E/e' medial:  31.3         LV IVS:        1.10 cm    +----------------+---------++ +--------------+--------++  LVOT  diam:     1.70 cm    +--------------+--------++  LV SV:         52 ml      +--------------+--------++  LV SV Index:   34.61      +--------------+--------++  LVOT Area:     2.27 cm   +--------------+--------++                            +--------------+--------++  +---------------+----------++  RIGHT VENTRICLE              +---------------+----------++  RV Basal diam:  3.70 cm      +---------------+----------++  RV S prime:     14.70 cm/s   +---------------+----------++  TAPSE (M-mode): 2.4 cm       +---------------+----------++  +---------------+-------++-----------++  LEFT ATRIUM              Index         +---------------+-------++-----------++  LA diam:        4.80 cm  3.26 cm/m    +---------------+-------++-----------++  LA Vol (A2C):   69.3 ml  47.04 ml/m   +---------------+-------++-----------++  LA Vol (A4C):   49.9 ml  33.87 ml/m   +---------------+-------++-----------++  LA Biplane Vol: 60.5 ml  41.07 ml/m   +---------------+-------++-----------++ +------------+---------++-----------++  RIGHT ATRIUM            Index         +------------+---------++-----------++  RA Area:     11.30 cm                +------------+---------++-----------++  RA Volume:   25.10 ml   17.04 ml/m   +------------+---------++-----------++  +------------------+------------++  AORTIC VALVE                      +------------------+------------++  AV Area (Vmax):    0.69 cm       +------------------+------------++  AV Area (Vmean):   0.64 cm       +------------------+------------++  AV Area (VTI):     0.67 cm       +------------------+------------++  AV Vmax:           424.60 cm/s    +------------------+------------++  AV Vmean:          308.600 cm/s   +------------------+------------++  AV VTI:            1.200 m        +------------------+------------++  AV Peak Grad:      72.1 mmHg      +------------------+------------++  AV Mean Grad:      43.0 mmHg       +------------------+------------++  LVOT Vmax:         129.50 cm/s    +------------------+------------++  LVOT Vmean:        87.050 cm/s    +------------------+------------++  LVOT VTI:          0.355 m        +------------------+------------++  LVOT/AV VTI ratio: 0.30           +------------------+------------++  AR PHT:            334 msec       +------------------+------------++   +-------------+-------++  AORTA                   +-------------+-------++  Ao Root diam: 2.70 cm   +-------------+-------++  +--------------+----------++  MITRAL VALVE                 +--------------+-------+ +--------------+----------++   SHUNTS                   MV Area (PHT): 2.34 cm      +--------------+-------+ +--------------+----------++   Systemic VTI:  0.36 m    MV PHT:        93.96 msec    +--------------+-------+ +--------------+----------++   Systemic Diam: 1.70 cm   MV Decel Time: 324 msec      +--------------+-------+ +--------------+----------++ +--------------+-----------++  MV E velocity: 137.00 cm/s   +--------------+-----------++  MV A velocity: 81.90 cm/s    +--------------+-----------++  MV E/A ratio:  1.67          +--------------+-----------++    Kirk Ruths MD Electronically signed by Kirk Ruths MD Signature Date/Time: 11/10/2018/11:41:13 AM    CLINICAL DATA:  Right lung cancer.  Partial gastrectomy.  EXAM: CT CHEST WITHOUT CONTRAST  TECHNIQUE: Multidetector CT imaging of the chest was performed following the standard protocol without IV contrast.  COMPARISON:  06/30/2018.  FINDINGS: Cardiovascular: Atherosclerotic calcification of the aorta, aortic valve and coronary arteries. Heart is mildly enlarged. No pericardial effusion.  Mediastinum/Nodes: 11 mm low-attenuation right thyroid nodule, as before. Mediastinal lymph nodes are not enlarged by CT size criteria. Hilar regions are difficult to evaluate without IV contrast. No axillary  adenopathy. Esophagus is grossly unremarkable.  Lungs/Pleura: Biapical pleuroparenchymal scarring. Right lower lobectomy with postoperative scarring and volume loss in the right hemithorax. Parenchymal consolidation and calcifications are seen in the right lower lobe, increased from 06/30/2018. Partially loculated moderate right pleural effusion, increased. There are scattered areas of nodular peribronchovascular ground-glass in the left upper and left lobes, which are largely new from 06/30/2018. No left pleural fluid. Airway is otherwise unremarkable.  Upper Abdomen: Visualized portions of the liver, gallbladder and adrenal glands are unremarkable. Punctate stone in the right kidney. Visualized portions of the kidneys, and pancreas are otherwise unremarkable. Partial gastrectomy.  Musculoskeletal: Degenerative changes in the spine. No worrisome or sclerotic lesions.  IMPRESSION: 1. Partially loculated moderate right pleural effusion and rounded consolidation in the right lower lobe, increased from 06/30/2018. Difficult to exclude disease recurrence. 2. Increasing ground-glass nodularity in the left lung. Recommend continued attention on follow-up exams. 3. Right renal stone. 4. Aortic atherosclerosis (ICD10-170.0). Coronary artery calcification.   Electronically Signed   By: Lorin Picket M.D.   On: 10/29/2018 15:46   Impression:  This 83 year old woman has stage D, severe, symptomatic aortic stenosis and multivessel coronary disease status post successful PCI within the past year which has resolved her chest pain symptoms.  She continues to have New York Heart Association class III symptoms of exertional shortness of breath and fatigue which are likely multifactorial due to both to chronic diastolic congestive heart failure from her aortic stenosis and a moderate sized right pleural effusion with right lung atelectasis.  Her situation is made more complicated due to  her diagnosis of stage IV recurrent adenocarcinoma of the lung with a malignant right pleural effusion which may be contributing to her shortness of breath.  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  Her most recent PET scan in May 2020 only showed disease limited to the right chest and it is possible that she could have a few years of survival in her  present state if her tumor is kept under control.  She does have a persistent partially loculated right pleural effusion that still has malignant cells in it on recent thoracentesis.  She is still fairly active and independent and would like to remain so as long as possible and I suspect that over the next year her quality of life is mainly going to be dictated by her severe symptomatic aortic stenosis.  I think if she were to undergo TAVR she would probably have a better quality of life for the remainder of her survival which could be up to a couple years.  This would also require management of her persistent malignant right pleural effusion with a Pleurx catheter and talc pleurodesis.  I think a palliative care approach is an option although I think her survival would be significantly less with poor quality of life due to the combination of severe aortic stenosis and her lung cancer.  I discussed all this with her and told her that I could not tell her how long she is likely to survive from her lung cancer to enjoy the benefit of TAVR.  She said that she is going to discuss it with her family but is inclined to proceed with TAVR to try to improve her shortness of breath and exertional fatigue.  Plan:  She will discuss this further with her family and if she decides to proceed then we will schedule transcatheter aortic valve replacement using a [employer] valve.  I would then let her recover for a few weeks postoperatively before draining her right pleural effusion with a Pleurx catheter and performing talc  pleurodesis.  I spent 15 minutes performing this established patient evaluation and > 50% of this time was spent face to face counseling and coordinating the care of this patient's severe symptomatic aortic stenosis.    Gaye Pollack, MD [facility] [phone]

## 2018-11-13 ENCOUNTER — Encounter: Payer: Self-pay | Admitting: Surgery

## 2018-11-20 ENCOUNTER — Other Ambulatory Visit: Payer: Self-pay

## 2018-11-20 DIAGNOSIS — I350 Nonrheumatic aortic (valve) stenosis: Secondary | ICD-10-CM

## 2018-11-27 DIAGNOSIS — Z952 Presence of prosthetic heart valve: Secondary | ICD-10-CM

## 2018-11-27 HISTORY — DX: Presence of prosthetic heart valve: Z95.2

## 2018-11-27 NOTE — Progress Notes (Signed)
[facility] [address] [place] [address] Phone: [phone] Fax: [phone]  [place], [place] [address] Phone: [phone] Fax: [phone]      Your procedure is scheduled on Tuesday, December 02, 2018.  Report to [facility] Main Entrance "A" at 1015 A.M., and check in at the Admitting office.  Call this number if you have problems the morning of surgery:  [phone]  Call [phone] if you have any questions prior to your surgery date Monday-Friday 8am-4pm    Remember:  Do not eat or drink after midnight the night before your surgery    Take these medicines the morning of surgery with A SIP OF WATER: None  7 days prior to surgery STOP taking any Aspirin (unless otherwise instructed by your surgeon), Aleve, Naproxen, Ibuprofen, Motrin, Advil, Goody's, BC's, all herbal medications, fish oil, and all vitamins.    The Morning of Surgery  Do not wear jewelry, make-up or nail polish.  Do not wear lotions, powders, or perfumes/colognes, or deodorant  Do not shave 48 hours prior to surgery.  Men may shave face and neck.  Do not bring valuables to the hospital.  [facility] is not responsible for any belongings or valuables.  If you are a smoker, DO NOT Smoke 24 hours prior to surgery IF you wear a CPAP at night please bring your mask, tubing, and machine the morning of surgery   Remember that you must have someone to transport you home after your surgery, and remain with you for 24 hours if you are discharged the same day.   Contacts, eyeglasses, hearing aids, dentures or bridgework may not be worn into surgery.    Leave your suitcase in the car.  After surgery it may be brought to your room.  For patients admitted to the hospital, discharge time will be determined by your treatment team.  Patients discharged the day of  surgery will not be allowed to drive home.    Special instructions:   [place] [facility]- Preparing For Surgery  Before surgery, you can play an important role. Because skin is not sterile, your skin needs to be as free of germs as possible. You can reduce the number of germs on your skin by washing with CHG (chlorahexidine gluconate) Soap before surgery.  CHG is an antiseptic cleaner which kills germs and bonds with the skin to continue killing germs even after washing.    Oral Hygiene is also important to reduce your risk of infection.  Remember - BRUSH YOUR TEETH THE MORNING OF SURGERY WITH YOUR REGULAR TOOTHPASTE  Please do not use if you have an allergy to CHG or antibacterial soaps. If your skin becomes reddened/irritated stop using the CHG.  Do not shave (including legs and underarms) for at least 48 hours prior to first CHG shower. It is OK to shave your face.  Please follow these instructions carefully.   1. Shower the NIGHT BEFORE SURGERY and the MORNING OF SURGERY with CHG Soap.   2. If you chose to wash your hair, wash your hair first as usual with your normal shampoo.  3. After you shampoo, rinse your hair and body thoroughly to remove the shampoo.  4. Use CHG as you would any other liquid soap. You can apply CHG directly to the skin and wash gently with a scrungie or a clean washcloth.   5.  Apply the CHG Soap to your body ONLY FROM THE NECK DOWN.  Do not use on open wounds or open sores. Avoid contact with your eyes, ears, mouth and genitals (private parts). Wash Face and genitals (private parts)  with your normal soap.   6. Wash thoroughly, paying special attention to the area where your surgery will be performed.  7. Thoroughly rinse your body with warm water from the neck down.  8. DO NOT shower/wash with your normal soap after using and rinsing off the CHG Soap.  9. Pat yourself dry with a CLEAN TOWEL.  10. Wear CLEAN PAJAMAS to bed the night before surgery, wear  comfortable clothes the morning of surgery  11. Place CLEAN SHEETS on your bed the night of your first shower and DO NOT SLEEP WITH PETS.    Day of Surgery:  Do not apply any deodorants/lotions. Please shower the morning of surgery with the CHG soap  Please wear clean clothes to the hospital/surgery center.   Remember to brush your teeth WITH YOUR REGULAR TOOTHPASTE.   Please read over the following fact sheets that you were given.

## 2018-11-28 ENCOUNTER — Ambulatory Visit (HOSPITAL_COMMUNITY)
Admission: RE | Admit: 2018-11-28 | Discharge: 2018-11-28 | Disposition: A | Discharge status: Home / Self Care | Payer: Medicare Other | Source: Ambulatory Visit | Attending: Cardiovascular Disease | Admitting: Cardiovascular Disease

## 2018-11-28 ENCOUNTER — Encounter (HOSPITAL_COMMUNITY): Payer: Self-pay

## 2018-11-28 ENCOUNTER — Other Ambulatory Visit (HOSPITAL_COMMUNITY)
Admission: RE | Admit: 2018-11-28 | Discharge: 2018-11-28 | Disposition: A | Discharge status: Home / Self Care | Payer: Medicare Other | Source: Ambulatory Visit | Attending: Cardiovascular Disease | Admitting: Cardiovascular Disease

## 2018-11-28 ENCOUNTER — Encounter: Payer: Self-pay | Admitting: Physical Therapy

## 2018-11-28 ENCOUNTER — Ambulatory Visit: Payer: Medicare Other | Attending: Cardiovascular Disease | Admitting: Physical Therapy

## 2018-11-28 ENCOUNTER — Other Ambulatory Visit: Payer: Self-pay

## 2018-11-28 ENCOUNTER — Other Ambulatory Visit: Payer: Self-pay | Admitting: Physician Assistant

## 2018-11-28 ENCOUNTER — Encounter (HOSPITAL_COMMUNITY)
Admission: RE | Admit: 2018-11-28 | Discharge: 2018-11-28 | Disposition: A | Discharge status: Home / Self Care | Payer: Medicare Other | Source: Ambulatory Visit | Attending: Cardiovascular Disease | Admitting: Cardiovascular Disease

## 2018-11-28 DIAGNOSIS — Z7902 Long term (current) use of antithrombotics/antiplatelets: Secondary | ICD-10-CM | POA: Diagnosis not present

## 2018-11-28 DIAGNOSIS — I350 Nonrheumatic aortic (valve) stenosis: Secondary | ICD-10-CM | POA: Insufficient documentation

## 2018-11-28 DIAGNOSIS — Z79899 Other long term (current) drug therapy: Secondary | ICD-10-CM | POA: Insufficient documentation

## 2018-11-28 DIAGNOSIS — Z85828 Personal history of other malignant neoplasm of skin: Secondary | ICD-10-CM | POA: Diagnosis not present

## 2018-11-28 DIAGNOSIS — Z8711 Personal history of peptic ulcer disease: Secondary | ICD-10-CM | POA: Diagnosis not present

## 2018-11-28 DIAGNOSIS — K219 Gastro-esophageal reflux disease without esophagitis: Secondary | ICD-10-CM | POA: Diagnosis not present

## 2018-11-28 DIAGNOSIS — Z902 Acquired absence of lung [part of]: Secondary | ICD-10-CM | POA: Insufficient documentation

## 2018-11-28 DIAGNOSIS — R001 Bradycardia, unspecified: Secondary | ICD-10-CM | POA: Diagnosis not present

## 2018-11-28 DIAGNOSIS — Z87442 Personal history of urinary calculi: Secondary | ICD-10-CM | POA: Diagnosis not present

## 2018-11-28 DIAGNOSIS — Z85118 Personal history of other malignant neoplasm of bronchus and lung: Secondary | ICD-10-CM | POA: Insufficient documentation

## 2018-11-28 DIAGNOSIS — I2510 Atherosclerotic heart disease of native coronary artery without angina pectoris: Secondary | ICD-10-CM | POA: Diagnosis not present

## 2018-11-28 DIAGNOSIS — I10 Essential (primary) hypertension: Secondary | ICD-10-CM | POA: Insufficient documentation

## 2018-11-28 DIAGNOSIS — R293 Abnormal posture: Secondary | ICD-10-CM | POA: Insufficient documentation

## 2018-11-28 DIAGNOSIS — J90 Pleural effusion, not elsewhere classified: Secondary | ICD-10-CM | POA: Diagnosis not present

## 2018-11-28 DIAGNOSIS — J9811 Atelectasis: Secondary | ICD-10-CM | POA: Diagnosis not present

## 2018-11-28 DIAGNOSIS — Z955 Presence of coronary angioplasty implant and graft: Secondary | ICD-10-CM | POA: Insufficient documentation

## 2018-11-28 DIAGNOSIS — Z01818 Encounter for other preprocedural examination: Secondary | ICD-10-CM | POA: Diagnosis present

## 2018-11-28 DIAGNOSIS — E119 Type 2 diabetes mellitus without complications: Secondary | ICD-10-CM | POA: Diagnosis not present

## 2018-11-28 DIAGNOSIS — Z7982 Long term (current) use of aspirin: Secondary | ICD-10-CM | POA: Diagnosis not present

## 2018-11-28 DIAGNOSIS — Z85028 Personal history of other malignant neoplasm of stomach: Secondary | ICD-10-CM | POA: Insufficient documentation

## 2018-11-28 DIAGNOSIS — Z20828 Contact with and (suspected) exposure to other viral communicable diseases: Secondary | ICD-10-CM | POA: Insufficient documentation

## 2018-11-28 HISTORY — DX: Personal history of urinary calculi: Z87.442

## 2018-11-28 LAB — URINALYSIS, ROUTINE W REFLEX MICROSCOPIC
Bilirubin Urine: NEGATIVE
Glucose, UA: NEGATIVE mg/dL
Ketones, ur: NEGATIVE mg/dL
Nitrite: NEGATIVE
Protein, ur: 30 mg/dL — AB
Specific Gravity, Urine: 1.01 (ref 1.005–1.030)
WBC, UA: >50 WBC/hpf — ABNORMAL HIGH (ref 0–5)
pH: 5 (ref 5.0–8.0)

## 2018-11-28 LAB — COMPREHENSIVE METABOLIC PANEL
ALT: 7 U/L (ref 0–44)
AST: 12 U/L — ABNORMAL LOW (ref 15–41)
Albumin: 3.8 g/dL (ref 3.5–5.0)
Alkaline Phosphatase: 66 U/L (ref 38–126)
Anion gap: 9 (ref 5–15)
BUN: 19 mg/dL (ref 8–23)
CO2: 18 mmol/L — ABNORMAL LOW (ref 22–32)
Calcium: 8.7 mg/dL — ABNORMAL LOW (ref 8.9–10.3)
Chloride: 114 mmol/L — ABNORMAL HIGH (ref 98–111)
Creatinine, Ser: 1.68 mg/dL — ABNORMAL HIGH (ref 0.44–1.00)
GFR calc Af Amer: 33 mL/min — ABNORMAL LOW (ref >60)
GFR calc non Af Amer: 28 mL/min — ABNORMAL LOW (ref >60)
Glucose, Bld: 95 mg/dL (ref 70–99)
Potassium: 4 mmol/L (ref 3.5–5.1)
Sodium: 141 mmol/L (ref 135–145)
Total Bilirubin: 0.7 mg/dL (ref 0.3–1.2)
Total Protein: 6.5 g/dL (ref 6.5–8.1)

## 2018-11-28 LAB — CBC
HCT: 29.5 % — ABNORMAL LOW (ref 36.0–46.0)
Hemoglobin: 9.5 g/dL — ABNORMAL LOW (ref 12.0–15.0)
MCH: 29.9 pg (ref 26.0–34.0)
MCHC: 32.2 g/dL (ref 30.0–36.0)
MCV: 92.8 fL (ref 80.0–100.0)
Platelets: 64 10*3/uL — ABNORMAL LOW (ref 150–400)
RBC: 3.18 MIL/uL — ABNORMAL LOW (ref 3.87–5.11)
RDW: 12.2 % (ref 11.5–15.5)
WBC: 3.2 10*3/uL — ABNORMAL LOW (ref 4.0–10.5)
nRBC: 0 % (ref 0.0–0.2)

## 2018-11-28 LAB — BLOOD GAS, ARTERIAL
Acid-base deficit: 2.4 mmol/L — ABNORMAL HIGH (ref 0.0–2.0)
Bicarbonate: 22 mmol/L (ref 20.0–28.0)
Drawn by: 42180
O2 Saturation: 98 %
Patient temperature: 98.6
pCO2 arterial: 38.7 mmHg (ref 32.0–48.0)
pH, Arterial: 7.374 (ref 7.350–7.450)
pO2, Arterial: 106 mmHg (ref 83.0–108.0)

## 2018-11-28 LAB — PROTIME-INR
INR: 1.2 (ref 0.8–1.2)
Prothrombin Time: 14.9 seconds (ref 11.4–15.2)

## 2018-11-28 LAB — HEMOGLOBIN A1C
Hgb A1c MFr Bld: 5.1 % (ref 4.8–5.6)
Mean Plasma Glucose: 99.67 mg/dL

## 2018-11-28 LAB — BRAIN NATRIURETIC PEPTIDE: B Natriuretic Peptide: 337.1 pg/mL — ABNORMAL HIGH (ref 0.0–100.0)

## 2018-11-28 LAB — SURGICAL PCR SCREEN
MRSA, PCR: NEGATIVE
Staphylococcus aureus: POSITIVE — AB

## 2018-11-28 LAB — ABO/RH: ABO/RH(D): O POS

## 2018-11-28 LAB — APTT: aPTT: 34 seconds (ref 24–36)

## 2018-11-28 LAB — SARS CORONAVIRUS 2 (TAT 6-24 HRS): SARS Coronavirus 2: NEGATIVE

## 2018-11-28 MED ORDER — CIPROFLOXACIN HCL 500 MG PO TABS: 500.0000 mg | ORAL_TABLET | Freq: Two times a day (BID) | ORAL | 0 refills | Status: AC
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
## 2018-11-28 NOTE — Progress Notes (Signed)
PCP - [facility] Cardiologist - [place]  Chest x-ray - 11-28-18 EKG - 11-28-18  ECHO - 11-10-18 Cardiac Cath - 09-19-17 / 10-15-17  Blood Thinner Instructions: follow your surgeon's instructions on when to stop Plavix and Aspirin.   Anesthesia review: yes, heart history  Patient denies shortness of breath, fever, cough and chest pain at PAT appointment   Patient verbalized understanding of instructions that were given to them at the PAT appointment. Patient was also instructed that they will need to review over the PAT instructions again at home before surgery.

## 2018-11-28 NOTE — Progress Notes (Signed)
Dr Angelena Form made aware of patient's abnormal urinalysis results from her Pre-Op appointment with [facility].  IB message sent, office was closed.

## 2018-12-01 MED ORDER — POTASSIUM CHLORIDE 2 MEQ/ML IV SOLN
80.0000 meq | INTRAVENOUS | Status: DC
  Filled 2018-12-01: qty 40

## 2018-12-01 MED ORDER — SODIUM CHLORIDE 0.9 % IV SOLN
1.5000 g | INTRAVENOUS | Status: AC
  Administered 2018-12-02: 1.5 g via INTRAVENOUS
  Filled 2018-12-01: qty 1.5

## 2018-12-01 MED ORDER — NOREPINEPHRINE 4 MG/250ML-% IV SOLN
0.0000 ug/min | INTRAVENOUS | Status: AC
  Administered 2018-12-02: 15:00:00 1 ug/min via INTRAVENOUS
  Filled 2018-12-01: qty 250

## 2018-12-01 MED ORDER — VANCOMYCIN HCL IN DEXTROSE 1-5 GM/200ML-% IV SOLN
1000.0000 mg | INTRAVENOUS | Status: AC
  Administered 2018-12-02: 1000 mg via INTRAVENOUS
  Filled 2018-12-01 (×2): qty 200

## 2018-12-01 MED ORDER — MAGNESIUM SULFATE 50 % IJ SOLN
40.0000 meq | INTRAMUSCULAR | Status: DC
  Filled 2018-12-01: qty 9.85

## 2018-12-01 MED ORDER — SODIUM CHLORIDE 0.9 % IV SOLN
INTRAVENOUS | Status: DC
  Filled 2018-12-01: qty 30

## 2018-12-01 MED ORDER — DEXMEDETOMIDINE HCL IN NACL 400 MCG/100ML IV SOLN
0.1000 ug/kg/h | INTRAVENOUS | Status: AC
  Administered 2018-12-02: 14:00:00 1 ug/kg/h via INTRAVENOUS
  Filled 2018-12-01 (×2): qty 100
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
## 2018-12-01 NOTE — H&P (Signed)
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 saturation were continuously monitored. The period of conscious sedation was 92 minutes, of  which I was present face-to-face 100% of this time.  Complications  Complications documented before study signed (10/15/2017 2:27 PM EDT)     Log Level Complications   None Documented by Kathleene Hazel, MD 10/15/2017 2:23 PM EDT  Time Range: Intraprocedure  Coronary Findings  Diagnostic  Dominance: Right  Left Anterior Descending  Prox LAD lesion 70% stenosed  Prox LAD lesion is 70% stenosed.  Left Circumflex  Vessel is large.  Prox Cx to Mid Cx lesion 95% stenosed  Prox Cx to Mid Cx lesion is 95% stenosed. The lesion is calcified.  First Obtuse Marginal Branch  Vessel is moderate in size.  Right Coronary Artery  Vessel is large.  Prox RCA to Mid RCA lesion 50% stenosed  Prox RCA to Mid RCA lesion is 50% stenosed.  Dist RCA lesion 90% stenosed  Dist RCA lesion is 90% stenosed.  Intervention  Prox Cx to Mid Cx lesion  Stent  Pre-stent angioplasty was performed using a BALLOON SAPPHIRE 2.5X15. A drug-eluting stent was successfully placed using a STENT SYNERGY DES 3X20. Post-stent angioplasty was performed using a BALLOON SAPPHIRE [place] 3.0X15.  Post-Intervention Lesion Assessment  The intervention was successful. Pre-interventional TIMI flow is 3. Post-intervention TIMI flow is 3. No complications occurred at this lesion.  There is a 0% residual stenosis post intervention.  Dist RCA lesion  Stent  Pre-stent angioplasty was performed using a  BALLOON SAPPHIRE 2.0X10. A drug-eluting stent was successfully placed using a STENT SYNERGY DES 3X16. Post-stent angioplasty was performed using a BALLOON SAPPHIRE [place] 3.25X12.  Post-Intervention Lesion Assessment  The intervention was successful. Pre-interventional TIMI flow is 3. Post-intervention TIMI flow is 3. No complications occurred at this lesion.  There is a 0% residual stenosis post intervention.  Coronary Diagrams  Diagnostic Diagram     Post-Intervention Diagram     Implants     Permanent Stent  Stent Synergy Des 3x20 -  [id number] - Implanted    Inventory item: STENT SYNERGY DES 3X20 Model/Cat number: [id number]  Manufacturer: [employer] Lot number: [id number]  Device identifier: [id number] Device identifier type: GS1  GUDID Information   Request status Successful    Brand name: SYNERGYT Version/Model: [id number]  Company name: [employer] MRI safety info as of 10/15/17: MR Conditional  Contains dry or latex rubber: No    GMDN P.T. name: Drug-eluting coronary artery stent, bioabsorbable-polymer-coated    As of 10/15/2017    Status: Implanted     Stent Synergy Des 3x16 - [id number] - Implanted    Inventory item: Marella Bile DES 3X16 Model/Cat number: [id number]  Manufacturer: [employer] Lot number: [id number]  Device identifier: [id number] Device identifier type: GS1  GUDID Information   Request status Successful    Brand name: SYNERGYT Version/Model: [id number]  Company name: [employer] MRI safety info as of 10/15/17: MR Conditional  Contains dry or latex rubber: No    GMDN P.T. name: Drug-eluting coronary artery stent, bioabsorbable-polymer-coated    As of 10/15/2017    Status: Implanted     MERGE Images   Link to Procedure Log    Show images for CARDIAC CATHETERIZATION  Procedure Log   Hemo Data   Most Recent Value  Aortic Mean Gradient 23.27 mmHg  Aortic Peak Gradient 17 mmHg  AO Systolic Pressure 166 mmHg  AO Diastolic Pressure 60 mmHg  AO Mean 100 mmHg  LV Systolic Pressure 200 mmHg  LV Diastolic Pressure 3 mmHg  LV EDP 18 mmHg  AOp Systolic Pressure 181 mmHg  AOp Diastolic Pressure 60 mmHg  AOp Mean Pressure 103 mmHg  LVp Systolic Pressure 198 mmHg  LVp Diastolic Pressure 2 mmHg  LVp EDP Pressure 19 mmHg    ADDENDUM REPORT: 02/11/2018 15:25  CLINICAL DATA: 83 year old female with severe aortic stenosis being  evaluated for a TAVR procedure.  EXAM:  Cardiac TAVR CT  TECHNIQUE:  The patient was scanned on a  [employer]. A 120 kV  retrospective scan was triggered in the descending thoracic aorta at  111 HU's. Gantry rotation speed was 250 msecs and collimation was .6  mm. No beta blockade or nitro were given. The 3D data set was  reconstructed in 5% intervals of the R-R cycle. Systolic and  diastolic phases were analyzed on a dedicated work station using  MPR, MIP and VRT modes. The patient received 80 cc of contrast.  FINDINGS:  Aortic Valve: Trileaflet, severely thickened and calcified aortic  valve with severely restricted leaflets opening and no  calcifications extending into the LVOT.  Aorta: Normal size with moderate diffuse calcifications and atheroma  in the aortic arch and descending thoracic aorta, minimal in the  ascending aorta. No dissection.  Sinotubular Junction: 22 x 21 mm  Ascending Thoracic Aorta: 30 x 29 mm  Aortic Arch: 25 x 22 mm  Descending Thoracic Aorta: 19 x 19 mm  Sinus of Valsalva Measurements:  Non-coronary:  25 mm  Right -coronary: 25 mm  Left -coronary: 26 mm  Sinus of Valsalva Height:  Right -coronary: 17 mm  Left -coronary: 19 mm  Coronary Artery Height above Annulus:  Left Main: 14 mm  Right Coronary: 13 mm  Virtual Basal Annulus Measurements:  Maximum/Minimum Diameter: 21.7 x 17.3 mm  Mean Diameter: 19.4 mm  Perimeter: 62.5 Mm  Area: 297 mm2  Optimum Fluoroscopic Angle for Delivery: LAO 2 CAU 1  IMPRESSION:  1. Trileaflet, severely thickened and calcified aortic valve with  severely restricted leaflets opening and no calcifications extending  into the LVOT. Annular measurements suitable for delivery of a 20 mm  Edwards-SAPIEN 3 valve or a 23 mm Medtronic CoreValve Evolut R  Valve.  2. Sufficient coronary to annulus distance.  3. Optimum Fluoroscopic Angle for Delivery: LAO 2 CAU 1.  4. No thrombus in the left atrial appendage.  5. Posterior mitral annular calcifications.  Electronically Signed  By: Tobias Alexander  On: 02/11/2018  15:25  CLINICAL DATA: Severe symptomatic aortic stenosis. Pre-TAVR  evaluation. History of right lower lobe lung adenocarcinoma status  post right lower lobectomy 05/09/2015. History of gastric  adenocarcinoma status post partial gastrectomy in April 2018.  EXAM:  CT ANGIOGRAPHY CHEST, ABDOMEN AND PELVIS  TECHNIQUE:  Multidetector CT imaging through the chest, abdomen and pelvis was  performed using the standard protocol during bolus administration of  intravenous contrast. Multiplanar reconstructed images and MIPs were  obtained and reviewed to evaluate the vascular anatomy.  CONTRAST: ISOVUE-370 IOPAMIDOL (ISOVUE-370) INJECTION 76%  COMPARISON: 08/27/2017 chest CT. 06/19/2016 CT abdomen/pelvis.  FINDINGS:  CTA CHEST FINDINGS  Cardiovascular: Mild cardiomegaly. No significant pericardial  effusion/thickening. Severe thickening and calcification of the  aortic valve. Three-vessel coronary atherosclerosis. Atherosclerotic  nonaneurysmal thoracic aorta. Normal caliber pulmonary arteries. No  central pulmonary emboli.  Mediastinum/Nodes: Stable bilateral thyroid lobe nodules, largest  1.1 cm on the right. Unremarkable esophagus. No pathologically  enlarged axillary, mediastinal or hilar lymph nodes. Top-normal 0.9  cm right hilar node (series 16/image 35).  Lungs/Pleura: Status post right lower lobectomy. New small dependent  right pleural effusion with mild irregular posterior right pleural  thickening. Right lateral basilar 1.5 x 0.8 cm pleural nodule  (series 16/image 55), increased from 0.8 x 0.4 cm on 08/27/2017  chest CT. No left pleural effusion. No pneumothorax. Irregular mild  nodular thickening along the minor fissure is increased with  dominant 7 mm nodule (series 17/image 45, previously 5 mm. Basilar  left lower lobe 6 mm solid pulmonary nodule (series 17/image 66) is  stable. No acute consolidative airspace disease or additional new  significant pulmonary nodules.   Musculoskeletal: No aggressive appearing focal osseous lesions. Mild  thoracic spondylosis.  CTA ABDOMEN AND PELVIS FINDINGS  Hepatobiliary: Normal liver with no liver mass. Normal gallbladder  with no radiopaque cholelithiasis. No biliary ductal dilatation.  Pancreas: Normal, with no mass or duct dilation.  Spleen: Normal size. No mass.  Adrenals/Urinary Tract: Normal adrenals. Nonobstructing 13 mm lower  left renal stone. No hydronephrosis. No contour deforming renal  masses. Normal bladder.  Stomach/Bowel: Small hiatal hernia. Status post partial distal  gastrectomy with intact appearing distal gastric anastomosis. No  discrete mass or definite wall thickening in the nondistended  remnant stomach. Normal caliber small bowel with no small bowel wall  thickening. Appendectomy. Normal large bowel with no diverticulosis,  large bowel wall thickening or pericolonic fat stranding.  Vascular/Lymphatic: Atherosclerotic nonaneurysmal abdominal aorta.  Patent portal, splenic and renal veins.  No pathologically enlarged  lymph nodes in the abdomen or pelvis.  Reproductive: Status post hysterectomy, with no abnormal findings at  the vaginal cuff. No adnexal mass.  Other: No pneumoperitoneum, ascites or focal fluid collection. A few  subcentimeter soft tissue nodules in the central upper mesentery  measuring up to 0.8 cm (series 16/image 95), possibly stable from  the prior preoperative CT abdomen study.  Musculoskeletal: No aggressive appearing focal osseous lesions.  Stable sclerotic lesions in the left hemipelvis characterized as  benign bone islands on 04/17/2016 PET-CT. Marked lumbar spondylosis.  VASCULAR MEASUREMENTS PERTINENT TO TAVR:  AORTA:  Minimal Aortic Diameter-10.6 x 10.6 mm (infrarenal abdominal aorta  on series 16/image 107)  Severity of Aortic Calcification-moderate to severe  RIGHT PELVIS:  Right Common Iliac Artery -  Minimal Diameter-8.5 x 6.8 mm  Tortuosity-mild   Calcification-moderate  Right External Iliac Artery -  Minimal Diameter-6.4 x 5.9 mm  Tortuosity-mild-to-moderate  Calcification-minimal  Right Common Femoral Artery -  Minimal Diameter-6.3 x 6.2 mm  Tortuosity-mild  Calcification-minimal  LEFT PELVIS:  Left Common Iliac Artery -  Minimal Diameter-7.8 x 7.1 mm  Tortuosity-mild  Calcification-moderate  Left External Iliac Artery -  Minimal Diameter-6.8 x 6.7 mm  Tortuosity-moderate  Calcification-none  Left Common Femoral Artery -  Minimal Diameter-6.5 x 6.4 mm  Tortuosity-mild  Calcification-moderate  Review of the MIP images confirms the above findings.  IMPRESSION:  1. Vascular findings and measurements pertinent to potential TAVR  procedure, as detailed above.  2. Severe thickening and calcification of the aortic valve,  compatible with the provided history of severe symptomatic aortic  stenosis.  3. New small dependent right pleural effusion. Increased irregular  right pleural/fissural thickening and nodularity with dominant 1.5  cm peripheral basilar right pleural nodule. Findings are suspicious  for recurrent metastatic disease in the right pleural space.  4. No definite findings of recurrent gastric tumor or metastatic  disease in the abdomen and pelvis. Subcentimeter nodularity in the  upper mesentery warrants continued attention on future follow-up CT  abdomen/pelvis studies.  5. Aortic Atherosclerosis (ICD10-I70.0). Additional chronic findings  as detailed.    Electronically Signed  By: Delbert Phenix M.D.  On: 02/10/2018 15:29   CLINICAL DATA:  Subsequent treatment strategy for lung carcinoma.  EXAM: NUCLEAR MEDICINE PET SKULL BASE TO THIGH  TECHNIQUE: 6.2 mCi F-18 FDG was injected intravenously. Full-ring PET imaging was performed from the skull base to thigh after the radiotracer. CT data was obtained and used for attenuation correction and anatomic localization.  Fasting blood glucose: 72  mg/dl  COMPARISON:  CT 29/56/2130, PET-CT 02/20/2018  FINDINGS: Mediastinal blood pool activity: SUV max 2.1  Liver activity: SUV max NA  NECK: No hypermetabolic lymph nodes in the neck.  Incidental CT findings: none  CHEST: Continued interval increase in volume of the partially loculated RIGHT pleural effusion. A focus of rounded tissue with central calcifications in the RIGHT lower lobe measures 3.9 by a 3.0 cm (image 110/3. This rounded lesion does not have significant metabolic activity and is most consistent round atelectasis (image 112).  There are no hypermetabolic pulmonary nodules in LEFT or RIGHT lung.  No hypermetabolic mediastinal lymph nodes. No hypermetabolic supraclavicular lymph nodes.  Incidental CT findings: Valvular and coronary artery calcifications.  ABDOMEN/PELVIS: No abnormal hypermetabolic activity within the liver, pancreas, adrenal glands, or spleen. No hypermetabolic lymph nodes in the abdomen or pelvis.  Incidental CT findings: Partial gastrectomy at the antrum. No evidence local recurrence. Large branching calculus lower  pole LEFT kidney.  SKELETON: No focal hypermetabolic activity to suggest skeletal metastasis.  Incidental CT findings: none  IMPRESSION: 1. No evidence lung cancer recurrence. 2. Persistent increase in partially loculated RIGHT pleural effusion. 3. New non metabolic rounded mass in the RIGHT lower lobe is favored rounded atelectasis. 4. No evidence of gastric carcinoma recurrence.  Partial gastrectomy 5. Coronary artery calcification and Aortic Atherosclerosis (ICD10-I70.0).   Electronically Signed   By: Genevive Bi M.D.   On: 09/04/2018 14:04    STS Risk Calculator   Risk of Mortality: 9.09%  Renal Failure: 8.823%  Permanent Stroke: 3.480%  Prolonged Ventilation: 19.169%  DSW Infection: 0.143%  Reoperation:6.773%  Morbidity or Mortality: 29.384%  Short Length of Stay: 13.132%  Long  Length of Stay: 16.496%    Impression:   This 83 year old woman has stage D, severe, symptomatic aortic stenosis and multivessel coronary disease status post successful PCI within the past year which has resolved her chest pain symptoms.  She continues to have New York Heart Association class III symptoms of exertional shortness of breath and fatigue which are likely multifactorial due to both to chronic diastolic congestive heart failure from her aortic stenosis and a moderate sized right pleural effusion with right lung atelectasis.  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  Her most recent PET scan in May 2020 only showed disease limited to the right chest and it is possible that she could have a few years of survival in her present state if her tumor is kept under control.  She does have a persistent partially loculated right pleural effusion that still has malignant cells in it on recent thoracentesis.  She is still fairly active and independent and would like to remain so as long as possible and I suspect that over the next year her quality of life is mainly going to be dictated by her severe symptomatic aortic stenosis.  I think if she were to undergo TAVR she would probably have a better quality of life for the remainder of her survival which could be up to a couple years.  This would also require management of her persistent malignant right pleural effusion with a Pleurx catheter and talc pleurodesis.  I think a palliative care approach is an option although I think her survival would be significantly less with poor quality of life due to the combination of severe aortic stenosis and her lung cancer.  I discussed all this with her and told her that I  could not tell her how long she is likely to survive from her lung cancer to enjoy the benefit of TAVR.    He has discussed all this with her family and decided proceed with TAVR.  The patient was counseled at length regarding treatment alternatives for management of severe symptomatic aortic stenosis. The risks and benefits of surgical intervention has been discussed in detail. Long-term prognosis with medical therapy was discussed. Alternative approaches such as conventional surgical aortic valve replacement, transcatheter aortic valve replacement, and palliative medical therapy were compared and contrasted at length. This discussion was placed in the context of the patient's own specific clinical presentation and past medical history. All of her questions have been addressed.   A discussion was held regarding what types  of management strategies would be attempted intraoperatively in the event of life-threatening complications, including whether or not the patient would be considered a candidate for the use of cardiopulmonary bypass and/or conversion to open sternotomy for attempted surgical intervention.  I do not think she would be a candidate for emergent sternotomy to manage any intraoperative complications given her metastatic lung cancer.  The patient has been advised of a variety of complications that might develop including but not limited to risks of death, stroke, paravalvular leak, aortic dissection or other major vascular complications, aortic annulus rupture, device embolization, cardiac rupture or perforation, mitral regurgitation, acute myocardial infarction, arrhythmia, heart block or bradycardia requiring permanent pacemaker placement, congestive heart failure, respiratory failure, renal failure, pneumonia, infection, other late complications related to structural valve deterioration or migration, or other complications that might ultimately cause a temporary or permanent loss of functional  independence or other long term morbidity. The patient provides full informed consent for the procedure as described and all questions were answered.    Plan:  Transfemoral transcatheter aortic valve replacement on Tuesday, 12/02/2018  Alleen Borne, MD

## 2018-12-02 ENCOUNTER — Inpatient Hospital Stay (HOSPITAL_COMMUNITY): Payer: Medicare Other | Admitting: Vascular Surgery

## 2018-12-02 ENCOUNTER — Inpatient Hospital Stay (HOSPITAL_COMMUNITY): Payer: Medicare Other

## 2018-12-02 ENCOUNTER — Encounter (HOSPITAL_COMMUNITY)
Admission: RE | Disposition: A | Discharge status: Rehabilitation Facility | Payer: Self-pay | Source: Home / Self Care | Attending: Surgery

## 2018-12-02 ENCOUNTER — Ambulatory Visit: Payer: Medicare Other | Admitting: Internal Medicine

## 2018-12-02 ENCOUNTER — Other Ambulatory Visit: Payer: Medicare Other

## 2018-12-02 ENCOUNTER — Other Ambulatory Visit: Payer: Self-pay | Admitting: Physician Assistant

## 2018-12-02 ENCOUNTER — Other Ambulatory Visit: Payer: Self-pay

## 2018-12-02 ENCOUNTER — Inpatient Hospital Stay (HOSPITAL_COMMUNITY)
Admission: RE | Admit: 2018-12-02 | Discharge: 2018-12-05 | DRG: 266 | Disposition: A | Discharge status: Rehabilitation Facility | Payer: Medicare Other | Attending: Surgery | Admitting: Surgery

## 2018-12-02 ENCOUNTER — Encounter (HOSPITAL_COMMUNITY): Payer: Self-pay | Admitting: Certified Registered Nurse Anesthetist

## 2018-12-02 DIAGNOSIS — N184 Chronic kidney disease, stage 4 (severe): Secondary | ICD-10-CM | POA: Diagnosis present

## 2018-12-02 DIAGNOSIS — I97821 Postprocedural cerebrovascular infarction during other surgery: Secondary | ICD-10-CM | POA: Diagnosis not present

## 2018-12-02 DIAGNOSIS — D61818 Other pancytopenia: Secondary | ICD-10-CM | POA: Diagnosis present

## 2018-12-02 DIAGNOSIS — F411 Generalized anxiety disorder: Secondary | ICD-10-CM | POA: Diagnosis not present

## 2018-12-02 DIAGNOSIS — Z902 Acquired absence of lung [part of]: Secondary | ICD-10-CM

## 2018-12-02 DIAGNOSIS — Z903 Acquired absence of stomach [part of]: Secondary | ICD-10-CM | POA: Diagnosis not present

## 2018-12-02 DIAGNOSIS — I10 Essential (primary) hypertension: Secondary | ICD-10-CM | POA: Diagnosis not present

## 2018-12-02 DIAGNOSIS — I69351 Hemiplegia and hemiparesis following cerebral infarction affecting right dominant side: Secondary | ICD-10-CM | POA: Diagnosis not present

## 2018-12-02 DIAGNOSIS — I083 Combined rheumatic disorders of mitral, aortic and tricuspid valves: Principal | ICD-10-CM | POA: Diagnosis present

## 2018-12-02 DIAGNOSIS — G8191 Hemiplegia, unspecified affecting right dominant side: Secondary | ICD-10-CM | POA: Diagnosis present

## 2018-12-02 DIAGNOSIS — I469 Cardiac arrest, cause unspecified: Secondary | ICD-10-CM | POA: Diagnosis not present

## 2018-12-02 DIAGNOSIS — Z952 Presence of prosthetic heart valve: Secondary | ICD-10-CM

## 2018-12-02 DIAGNOSIS — Z954 Presence of other heart-valve replacement: Secondary | ICD-10-CM | POA: Diagnosis not present

## 2018-12-02 DIAGNOSIS — Z7902 Long term (current) use of antithrombotics/antiplatelets: Secondary | ICD-10-CM | POA: Diagnosis not present

## 2018-12-02 DIAGNOSIS — Z888 Allergy status to other drugs, medicaments and biological substances status: Secondary | ICD-10-CM

## 2018-12-02 DIAGNOSIS — I69322 Dysarthria following cerebral infarction: Secondary | ICD-10-CM | POA: Diagnosis not present

## 2018-12-02 DIAGNOSIS — E1122 Type 2 diabetes mellitus with diabetic chronic kidney disease: Secondary | ICD-10-CM | POA: Diagnosis present

## 2018-12-02 DIAGNOSIS — I6340 Cerebral infarction due to embolism of unspecified cerebral artery: Secondary | ICD-10-CM | POA: Diagnosis not present

## 2018-12-02 DIAGNOSIS — M810 Age-related osteoporosis without current pathological fracture: Secondary | ICD-10-CM | POA: Diagnosis present

## 2018-12-02 DIAGNOSIS — E119 Type 2 diabetes mellitus without complications: Secondary | ICD-10-CM

## 2018-12-02 DIAGNOSIS — Z882 Allergy status to sulfonamides status: Secondary | ICD-10-CM | POA: Diagnosis not present

## 2018-12-02 DIAGNOSIS — I350 Nonrheumatic aortic (valve) stenosis: Secondary | ICD-10-CM

## 2018-12-02 DIAGNOSIS — D696 Thrombocytopenia, unspecified: Secondary | ICD-10-CM | POA: Diagnosis not present

## 2018-12-02 DIAGNOSIS — G40901 Epilepsy, unspecified, not intractable, with status epilepticus: Secondary | ICD-10-CM | POA: Diagnosis not present

## 2018-12-02 DIAGNOSIS — Z7982 Long term (current) use of aspirin: Secondary | ICD-10-CM | POA: Diagnosis not present

## 2018-12-02 DIAGNOSIS — I2510 Atherosclerotic heart disease of native coronary artery without angina pectoris: Secondary | ICD-10-CM | POA: Diagnosis present

## 2018-12-02 DIAGNOSIS — K5900 Constipation, unspecified: Secondary | ICD-10-CM | POA: Diagnosis present

## 2018-12-02 DIAGNOSIS — N183 Chronic kidney disease, stage 3 (moderate): Secondary | ICD-10-CM | POA: Diagnosis not present

## 2018-12-02 DIAGNOSIS — Z006 Encounter for examination for normal comparison and control in clinical research program: Secondary | ICD-10-CM | POA: Diagnosis not present

## 2018-12-02 DIAGNOSIS — I6523 Occlusion and stenosis of bilateral carotid arteries: Secondary | ICD-10-CM | POA: Diagnosis not present

## 2018-12-02 DIAGNOSIS — I5033 Acute on chronic diastolic (congestive) heart failure: Secondary | ICD-10-CM

## 2018-12-02 DIAGNOSIS — R29704 NIHSS score 4: Secondary | ICD-10-CM | POA: Diagnosis present

## 2018-12-02 DIAGNOSIS — N179 Acute kidney failure, unspecified: Secondary | ICD-10-CM | POA: Diagnosis not present

## 2018-12-02 DIAGNOSIS — D6959 Other secondary thrombocytopenia: Secondary | ICD-10-CM | POA: Diagnosis present

## 2018-12-02 DIAGNOSIS — D631 Anemia in chronic kidney disease: Secondary | ICD-10-CM | POA: Diagnosis not present

## 2018-12-02 DIAGNOSIS — R55 Syncope and collapse: Secondary | ICD-10-CM | POA: Diagnosis not present

## 2018-12-02 DIAGNOSIS — E876 Hypokalemia: Secondary | ICD-10-CM | POA: Diagnosis not present

## 2018-12-02 DIAGNOSIS — R569 Unspecified convulsions: Secondary | ICD-10-CM | POA: Diagnosis not present

## 2018-12-02 DIAGNOSIS — E785 Hyperlipidemia, unspecified: Secondary | ICD-10-CM | POA: Diagnosis present

## 2018-12-02 DIAGNOSIS — C160 Malignant neoplasm of cardia: Secondary | ICD-10-CM | POA: Diagnosis not present

## 2018-12-02 DIAGNOSIS — I63439 Cerebral infarction due to embolism of unspecified posterior cerebral artery: Secondary | ICD-10-CM | POA: Diagnosis not present

## 2018-12-02 DIAGNOSIS — I639 Cerebral infarction, unspecified: Secondary | ICD-10-CM | POA: Diagnosis not present

## 2018-12-02 DIAGNOSIS — Z9861 Coronary angioplasty status: Secondary | ICD-10-CM | POA: Diagnosis not present

## 2018-12-02 DIAGNOSIS — E1159 Type 2 diabetes mellitus with other circulatory complications: Secondary | ICD-10-CM | POA: Diagnosis not present

## 2018-12-02 DIAGNOSIS — C3431 Malignant neoplasm of lower lobe, right bronchus or lung: Secondary | ICD-10-CM | POA: Diagnosis present

## 2018-12-02 DIAGNOSIS — D649 Anemia, unspecified: Secondary | ICD-10-CM | POA: Diagnosis present

## 2018-12-02 DIAGNOSIS — D6481 Anemia due to antineoplastic chemotherapy: Secondary | ICD-10-CM | POA: Diagnosis present

## 2018-12-02 DIAGNOSIS — Z887 Allergy status to serum and vaccine status: Secondary | ICD-10-CM

## 2018-12-02 DIAGNOSIS — I6529 Occlusion and stenosis of unspecified carotid artery: Secondary | ICD-10-CM | POA: Diagnosis present

## 2018-12-02 DIAGNOSIS — Z953 Presence of xenogenic heart valve: Secondary | ICD-10-CM | POA: Diagnosis not present

## 2018-12-02 DIAGNOSIS — K219 Gastro-esophageal reflux disease without esophagitis: Secondary | ICD-10-CM | POA: Diagnosis present

## 2018-12-02 DIAGNOSIS — Z955 Presence of coronary angioplasty implant and graft: Secondary | ICD-10-CM

## 2018-12-02 DIAGNOSIS — D002 Carcinoma in situ of stomach: Secondary | ICD-10-CM | POA: Diagnosis not present

## 2018-12-02 DIAGNOSIS — Z85828 Personal history of other malignant neoplasm of skin: Secondary | ICD-10-CM

## 2018-12-02 DIAGNOSIS — I5032 Chronic diastolic (congestive) heart failure: Secondary | ICD-10-CM | POA: Diagnosis not present

## 2018-12-02 DIAGNOSIS — N189 Chronic kidney disease, unspecified: Secondary | ICD-10-CM | POA: Diagnosis not present

## 2018-12-02 DIAGNOSIS — I130 Hypertensive heart and chronic kidney disease with heart failure and stage 1 through stage 4 chronic kidney disease, or unspecified chronic kidney disease: Secondary | ICD-10-CM | POA: Diagnosis present

## 2018-12-02 DIAGNOSIS — R471 Dysarthria and anarthria: Secondary | ICD-10-CM | POA: Diagnosis not present

## 2018-12-02 DIAGNOSIS — I749 Embolism and thrombosis of unspecified artery: Secondary | ICD-10-CM | POA: Diagnosis not present

## 2018-12-02 DIAGNOSIS — K5901 Slow transit constipation: Secondary | ICD-10-CM | POA: Diagnosis not present

## 2018-12-02 DIAGNOSIS — I6350 Cerebral infarction due to unspecified occlusion or stenosis of unspecified cerebral artery: Secondary | ICD-10-CM | POA: Diagnosis not present

## 2018-12-02 DIAGNOSIS — T451X5A Adverse effect of antineoplastic and immunosuppressive drugs, initial encounter: Secondary | ICD-10-CM | POA: Diagnosis present

## 2018-12-02 DIAGNOSIS — I672 Cerebral atherosclerosis: Secondary | ICD-10-CM | POA: Diagnosis present

## 2018-12-02 HISTORY — DX: Presence of prosthetic heart valve: Z95.2

## 2018-12-02 HISTORY — PX: TRANSCATHETER AORTIC VALVE REPLACEMENT, TRANSFEMORAL: SHX6400

## 2018-12-02 HISTORY — PX: TEE WITHOUT CARDIOVERSION: SHX5443

## 2018-12-02 LAB — POCT I-STAT 4, (NA,K, GLUC, HGB,HCT)
Glucose, Bld: 84 mg/dL (ref 70–99)
Glucose, Bld: 139 mg/dL — ABNORMAL HIGH (ref 70–99)
HCT: 23 % — ABNORMAL LOW (ref 36.0–46.0)
HCT: 21 % — ABNORMAL LOW (ref 36.0–46.0)
Hemoglobin: 7.8 g/dL — ABNORMAL LOW (ref 12.0–15.0)
Hemoglobin: 7.1 g/dL — ABNORMAL LOW (ref 12.0–15.0)
Potassium: 3.8 mmol/L (ref 3.5–5.1)
Potassium: 3.9 mmol/L (ref 3.5–5.1)
Sodium: 143 mmol/L (ref 135–145)
Sodium: 142 mmol/L (ref 135–145)

## 2018-12-02 LAB — POCT ACTIVATED CLOTTING TIME
Activated Clotting Time: 132 seconds
Activated Clotting Time: 114 seconds
Activated Clotting Time: 325 seconds

## 2018-12-02 LAB — POCT I-STAT, CHEM 8
BUN: 22 mg/dL (ref 8–23)
Calcium, Ion: 1.24 mmol/L (ref 1.15–1.40)
Chloride: 108 mmol/L (ref 98–111)
Creatinine, Ser: 1.5 mg/dL — ABNORMAL HIGH (ref 0.44–1.00)
Glucose, Bld: 127 mg/dL — ABNORMAL HIGH (ref 70–99)
HCT: 22 % — ABNORMAL LOW (ref 36.0–46.0)
Hemoglobin: 7.5 g/dL — ABNORMAL LOW (ref 12.0–15.0)
Potassium: 4.1 mmol/L (ref 3.5–5.1)
Sodium: 141 mmol/L (ref 135–145)
TCO2: 22 mmol/L (ref 22–32)

## 2018-12-02 LAB — GLUCOSE, CAPILLARY: Glucose-Capillary: 94 mg/dL (ref 70–99)

## 2018-12-02 SURGERY — IMPLANTATION, AORTIC VALVE, TRANSCATHETER, FEMORAL APPROACH
Anesthesia: Monitor Anesthesia Care

## 2018-12-02 MED ORDER — NITROGLYCERIN IN D5W 200-5 MCG/ML-% IV SOLN: 0.0000 ug/min | INTRAVENOUS | Status: DC

## 2018-12-02 MED ORDER — PROTAMINE SULFATE 10 MG/ML IV SOLN
INTRAVENOUS | Status: DC | PRN
  Administered 2018-12-02: 60 mg via INTRAVENOUS
  Administered 2018-12-02: 10 mg via INTRAVENOUS

## 2018-12-02 MED ORDER — PHENYLEPHRINE HCL-NACL 20-0.9 MG/250ML-% IV SOLN: 0.0000 ug/min | INTRAVENOUS | Status: DC

## 2018-12-02 MED ORDER — PROPOFOL 500 MG/50ML IV EMUL
INTRAVENOUS | Status: DC | PRN
  Administered 2018-12-02: 10 ug/kg/min via INTRAVENOUS

## 2018-12-02 MED ORDER — CHLORHEXIDINE GLUCONATE CLOTH 2 % EX PADS
6.0000 | MEDICATED_PAD | Freq: Every day | CUTANEOUS | Status: DC
  Administered 2018-12-03 – 2018-12-05 (×2): 6 via TOPICAL

## 2018-12-02 MED ORDER — ACETAMINOPHEN 500 MG PO TABS
1000.0000 mg | ORAL_TABLET | Freq: Once | ORAL | Status: AC
  Administered 2018-12-02: 11:00:00 1000 mg via ORAL

## 2018-12-02 MED ORDER — STROKE: EARLY STAGES OF RECOVERY BOOK
Freq: Once | Status: AC
  Administered 2018-12-03: 07:00:00
  Filled 2018-12-02: qty 1

## 2018-12-02 MED ORDER — SODIUM CHLORIDE 0.9 % IV SOLN: INTRAVENOUS | Status: AC

## 2018-12-02 MED ORDER — OSIMERTINIB MESYLATE 80 MG PO TABS
80.0000 mg | ORAL_TABLET | Freq: Every evening | ORAL | Status: DC
  Administered 2018-12-03 – 2018-12-04 (×2): 80 mg via ORAL

## 2018-12-02 MED ORDER — CHLORHEXIDINE GLUCONATE 4 % EX LIQD: 60.0000 mL | Freq: Once | CUTANEOUS | Status: DC

## 2018-12-02 MED ORDER — PROPOFOL 10 MG/ML IV BOLUS
INTRAVENOUS | Status: DC | PRN
  Administered 2018-12-02: 5 mg via INTRAVENOUS

## 2018-12-02 MED ORDER — IOHEXOL 350 MG/ML SOLN
INTRAVENOUS | Status: DC | PRN
  Administered 2018-12-02: 60 mL via INTRA_ARTERIAL

## 2018-12-02 MED ORDER — OXYCODONE HCL 5 MG PO TABS
5.0000 mg | ORAL_TABLET | ORAL | Status: DC | PRN
  Filled 2018-12-02: qty 1

## 2018-12-02 MED ORDER — HEPARIN (PORCINE) IN NACL 1000-0.9 UT/500ML-% IV SOLN
INTRAVENOUS | Status: AC
  Filled 2018-12-02: qty 1000

## 2018-12-02 MED ORDER — SODIUM CHLORIDE 0.9 % IV SOLN
INTRAVENOUS | Status: DC
  Administered 2018-12-02: 11:00:00 via INTRAVENOUS

## 2018-12-02 MED ORDER — ASPIRIN EC 81 MG PO TBEC
81.0000 mg | DELAYED_RELEASE_TABLET | Freq: Every day | ORAL | Status: DC
  Administered 2018-12-03 – 2018-12-05 (×3): 81 mg via ORAL
  Filled 2018-12-02 (×3): qty 1

## 2018-12-02 MED ORDER — LIDOCAINE HCL (PF) 1 % IJ SOLN
INTRAMUSCULAR | Status: DC | PRN
  Administered 2018-12-02: 15 mL

## 2018-12-02 MED ORDER — SODIUM CHLORIDE 0.9% FLUSH: 3.0000 mL | INTRAVENOUS | Status: DC | PRN

## 2018-12-02 MED ORDER — FERROUS SULFATE 325 (65 FE) MG PO TABS
325.0000 mg | ORAL_TABLET | Freq: Every day | ORAL | Status: DC
  Administered 2018-12-03 – 2018-12-05 (×3): 325 mg via ORAL
  Filled 2018-12-02 (×3): qty 1

## 2018-12-02 MED ORDER — ONDANSETRON HCL 4 MG/2ML IJ SOLN
INTRAMUSCULAR | Status: DC | PRN
  Administered 2018-12-02: 4 mg via INTRAVENOUS

## 2018-12-02 MED ORDER — CHLORHEXIDINE GLUCONATE 0.12 % MT SOLN
OROMUCOSAL | Status: AC
  Administered 2018-12-02: 15 mL via OROMUCOSAL
  Filled 2018-12-02: qty 15

## 2018-12-02 MED ORDER — PANTOPRAZOLE SODIUM 40 MG PO TBEC
40.0000 mg | DELAYED_RELEASE_TABLET | Freq: Every day | ORAL | Status: DC
  Administered 2018-12-02 – 2018-12-05 (×4): 40 mg via ORAL
  Filled 2018-12-02 (×4): qty 1

## 2018-12-02 MED ORDER — SODIUM CHLORIDE 0.9% FLUSH
3.0000 mL | Freq: Two times a day (BID) | INTRAVENOUS | Status: DC
  Administered 2018-12-02 – 2018-12-05 (×6): 3 mL via INTRAVENOUS

## 2018-12-02 MED ORDER — CLOPIDOGREL BISULFATE 75 MG PO TABS
75.0000 mg | ORAL_TABLET | Freq: Every day | ORAL | Status: DC
  Administered 2018-12-03 – 2018-12-05 (×3): 75 mg via ORAL
  Filled 2018-12-02 (×3): qty 1

## 2018-12-02 MED ORDER — HEPARIN (PORCINE) IN NACL 1000-0.9 UT/500ML-% IV SOLN
INTRAVENOUS | Status: DC | PRN
  Administered 2018-12-02 (×3): 500 mL

## 2018-12-02 MED ORDER — FENTANYL CITRATE (PF) 100 MCG/2ML IJ SOLN
INTRAMUSCULAR | Status: DC | PRN
  Administered 2018-12-02: 25 ug via INTRAVENOUS

## 2018-12-02 MED ORDER — SODIUM CHLORIDE 0.9 % IV SOLN
250.0000 mL | INTRAVENOUS | Status: DC | PRN
  Administered 2018-12-04: 250 mL via INTRAVENOUS

## 2018-12-02 MED ORDER — CHLORHEXIDINE GLUCONATE 0.12 % MT SOLN
15.0000 mL | Freq: Once | OROMUCOSAL | Status: AC
  Administered 2018-12-02: 11:00:00 15 mL via OROMUCOSAL

## 2018-12-02 MED ORDER — ACETAMINOPHEN 325 MG PO TABS: 650.0000 mg | ORAL_TABLET | Freq: Four times a day (QID) | ORAL | Status: DC | PRN

## 2018-12-02 MED ORDER — VANCOMYCIN HCL IN DEXTROSE 1-5 GM/200ML-% IV SOLN
1000.0000 mg | Freq: Once | INTRAVENOUS | Status: AC
  Administered 2018-12-02: 1000 mg via INTRAVENOUS
  Filled 2018-12-02: qty 200

## 2018-12-02 MED ORDER — LIDOCAINE HCL (PF) 1 % IJ SOLN
INTRAMUSCULAR | Status: AC
  Filled 2018-12-02: qty 30

## 2018-12-02 MED ORDER — DEXMEDETOMIDINE HCL IN NACL 200 MCG/50ML IV SOLN
INTRAVENOUS | Status: DC | PRN
  Administered 2018-12-02: 24.76 ug via INTRAVENOUS

## 2018-12-02 MED ORDER — HEPARIN SODIUM (PORCINE) 1000 UNIT/ML IJ SOLN
INTRAMUSCULAR | Status: DC | PRN
  Administered 2018-12-02: 7000 [IU] via INTRAVENOUS

## 2018-12-02 MED ORDER — MUPIROCIN 2 % EX OINT
1.0000 "application " | TOPICAL_OINTMENT | Freq: Two times a day (BID) | CUTANEOUS | Status: DC
  Administered 2018-12-02 – 2018-12-05 (×6): 1 via NASAL
  Filled 2018-12-02 (×2): qty 22

## 2018-12-02 MED ORDER — MORPHINE SULFATE (PF) 10 MG/ML IV SOLN: 1.0000 mg | INTRAVENOUS | Status: DC | PRN

## 2018-12-02 MED ORDER — CHLORHEXIDINE GLUCONATE 4 % EX LIQD: 30.0000 mL | CUTANEOUS | Status: DC

## 2018-12-02 MED ORDER — SODIUM CHLORIDE 0.9 % IV SOLN
1.5000 g | Freq: Two times a day (BID) | INTRAVENOUS | Status: DC
  Administered 2018-12-02: 1.5 g via INTRAVENOUS
  Filled 2018-12-02 (×2): qty 1.5

## 2018-12-02 MED ORDER — SODIUM CHLORIDE 0.9 % IV SOLN
1.5000 g | INTRAVENOUS | Status: AC
  Administered 2018-12-03 – 2018-12-04 (×2): 1.5 g via INTRAVENOUS
  Filled 2018-12-02 (×2): qty 1.5

## 2018-12-02 MED ORDER — LACTATED RINGERS IV SOLN
INTRAVENOUS | Status: DC
  Administered 2018-12-02: 11:00:00 via INTRAVENOUS

## 2018-12-02 MED ORDER — TRAMADOL HCL 50 MG PO TABS
50.0000 mg | ORAL_TABLET | ORAL | Status: DC | PRN
  Administered 2018-12-04: 50 mg via ORAL
  Filled 2018-12-02: qty 1

## 2018-12-02 MED ORDER — MORPHINE SULFATE (PF) 2 MG/ML IV SOLN: 1.0000 mg | INTRAVENOUS | Status: DC | PRN

## 2018-12-02 MED ORDER — ACETAMINOPHEN 500 MG PO TABS
ORAL_TABLET | ORAL | Status: AC
  Administered 2018-12-02: 1000 mg via ORAL
  Filled 2018-12-02: qty 2

## 2018-12-02 MED ORDER — ONDANSETRON HCL 4 MG/2ML IJ SOLN: 4.0000 mg | Freq: Four times a day (QID) | INTRAMUSCULAR | Status: DC | PRN

## 2018-12-02 MED ORDER — MIDAZOLAM HCL 2 MG/2ML IJ SOLN
INTRAMUSCULAR | Status: DC | PRN
  Administered 2018-12-02: 1 mg via INTRAVENOUS

## 2018-12-02 MED ORDER — ACETAMINOPHEN 650 MG RE SUPP: 650.0000 mg | Freq: Four times a day (QID) | RECTAL | Status: DC | PRN

## 2018-12-02 SURGICAL SUPPLY — 38 items
BAG SNAP BAND KOVER 36X36 (MISCELLANEOUS) ×6 IMPLANT
BLANKET WARM UNDERBOD FULL ACC (MISCELLANEOUS) ×3 IMPLANT
CABLE ADAPT PACING TEMP 12FT (ADAPTER) ×3 IMPLANT
CATH DIAG 6FR PIGTAIL (CATHETERS) ×3 IMPLANT
CATH DIAG 6FR PIGTAIL ANGLED (CATHETERS) ×3 IMPLANT
CATH INFINITI 6F AL1 (CATHETERS) ×3 IMPLANT
CATH S G BIP PACING (CATHETERS) ×3 IMPLANT
CLOSURE MYNX CONTROL 6F/7F (Vascular Products) ×3 IMPLANT
DERMABOND ADVANCED (GAUZE/BANDAGES/DRESSINGS) ×2
DERMABOND ADVANCED .7 DNX12 (GAUZE/BANDAGES/DRESSINGS) ×1 IMPLANT
DEVICE CLOSURE PERCLS PRGLD 6F (VASCULAR PRODUCTS) ×2 IMPLANT
DRYSEAL FLEXSHEATH 18FR 33CM (SHEATH) ×2
ELECT DEFIB PAD ADLT CADENCE (PAD) ×3 IMPLANT
GUIDEWIRE CNFDA BRKR CVD (WIRE) ×3 IMPLANT
KIT DILATOR VASC 18G NDL (KITS) ×3 IMPLANT
KIT HEART LEFT (KITS) ×3 IMPLANT
KIT MICROPUNCTURE NIT STIFF (SHEATH) ×3 IMPLANT
PACK CARDIAC CATHETERIZATION (CUSTOM PROCEDURE TRAY) ×3 IMPLANT
PERCLOSE PROGLIDE 6F (VASCULAR PRODUCTS) ×6
SHEATH BRITE TIP 7FR 35CM (SHEATH) ×3 IMPLANT
SHEATH DRYSEAL FLEX 18FR 33CM (SHEATH) ×1 IMPLANT
SHEATH PINNACLE 6F 10CM (SHEATH) ×3 IMPLANT
SHEATH PINNACLE 8F 10CM (SHEATH) ×3 IMPLANT
SHEATH PROBE COVER 6X72 (BAG) ×3 IMPLANT
SLEEVE REPOSITIONING LENGTH 30 (MISCELLANEOUS) ×3 IMPLANT
STOPCOCK MORSE 400PSI 3WAY (MISCELLANEOUS) ×6 IMPLANT
SYS DEL EVOLUT PROPLS 23 26 29 (CATHETERS) ×3
SYS LOAD EVOLT PROPLS 23 26 29 (CATHETERS) ×3
SYSTEM DEL EVLT PRPLS 23 26 29 (CATHETERS) ×1 IMPLANT
SYSTEM LOAD EVLT PRPLS23 26 29 (CATHETERS) ×1 IMPLANT
TRANSDUCER W/STOPCOCK (MISCELLANEOUS) ×6 IMPLANT
TUBE CONN 8.8X1320 FR HP M-F (CONNECTOR) ×3 IMPLANT
TUBING ART PRESS 72  MALE/FEM (TUBING) ×2
TUBING ART PRESS 72 MALE/FEM (TUBING) ×1 IMPLANT
VALVE AORTIC EVOLT PROPLUS 23 (Valve) ×3 IMPLANT
WIRE AMPLATZ SS-J .035X180CM (WIRE) ×3 IMPLANT
WIRE EMERALD 3MM-J .035X260CM (WIRE) ×3 IMPLANT
WIRE EMERALD ST .035X260CM (WIRE) ×3 IMPLANT

## 2018-12-02 NOTE — Anesthesia Procedure Notes (Signed)
Procedure Name: MAC Date/Time: 12/02/2018 1:54 PM Performed by: Elayne Snare, CRNA Pre-anesthesia Checklist: Patient identified, Emergency Drugs available, Suction available and Patient being monitored Patient Re-evaluated:Patient Re-evaluated prior to induction Oxygen Delivery Method: Simple face mask

## 2018-12-02 NOTE — Progress Notes (Signed)
@  [phone] Paged Dr. Paticia Stack on-call for attending regarding pt's stroke-like symptoms and to confirm MRI was okay from surgical standpoint (at request of neurologist). Page promptly returned and MRI okayed. Will convey to Neurology and continue to monitor.

## 2018-12-02 NOTE — Op Note (Signed)
[facility]   MULTIDISCIPLINARY HEART VALVE TEAM   TAVR OPERATIVE NOTE    Stacy Cook [id number]   Date of Procedure:  12/02/2018  Preoperative Diagnosis: Severe Aortic Stenosis   Postoperative Diagnosis: Same   Procedure:    Transcatheter Aortic Valve Replacement - Percutaneous Right Transfemoral Approach  Medtronic Evolut Pro + (size 23 mm,  serial # [id number])   Co-Surgeons:  Gaye Pollack, MD and Lauree Chandler, MD   Anesthesiologist:  Perfecto Kingdom, MD  Echocardiographer:  Ena Dawley, MD  Pre-operative Echo Findings:  Severe aortic stenosis  Normal left ventricular systolic function  Post-operative Echo Findings:  Trivial paravalvular leak  Normal left ventricular systolic function   BRIEF CLINICAL NOTE AND INDICATIONS FOR SURGERY  This 83 year old woman has stage D, severe, symptomatic aortic stenosis and multivessel coronary disease status post successful PCI within the past year which has resolved her chest pain symptoms. She continues to have New York Heart Association class III symptoms of exertional shortness of breath and fatigue which are likely multifactorial due to both to chronic diastolic congestive heart failure from her aortic stenosis and a moderate sized right pleural effusion with right lung atelectasis. 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 Her most recent PET scan in May 2020 only showed disease limited to the right chest and it is possible that she could have a few years of survival in her present state if her tumor is kept under control. She does have a persistent partially loculated right pleural effusion that still has malignant cells in it on recent  thoracentesis.She is still fairly active and independent and would like to remain so as long as possible and I suspect that over the next year her quality of life is mainly going to be dictated by her severe symptomatic aortic stenosis. I think if she were to undergo TAVR she would probably have a better quality of life for the remainder of her survival which could be up to a couple years. This would also require management of her persistent malignant right pleural effusion with a Pleurx catheter and talc pleurodesis. I think a palliative care approach is an option although I think her survival would be significantly less with poor quality of life due to the combination of severe aortic stenosis and her lung cancer. I discussed all this with her and told her that I could not tell her how long she is likely to survive from her lung cancer to enjoy the benefit of TAVR.   He has discussed all this with her family and decided proceed with TAVR.  The patient was counseled at length regarding treatment alternatives for management of severe symptomatic aortic stenosis. The risks and benefits of surgical intervention has been discussed in detail. Long-term prognosis with medical therapy was discussed. Alternative approaches such as conventional surgical aortic valve replacement, transcatheter aortic valve replacement, and palliative medical therapy were compared and contrasted at length. This discussion was placed in the context of the patient's own specific clinical presentation and past medical history. All of her questions have been addressed.   A discussion was held regarding what types of management strategies would be attempted intraoperatively in the event of life-threatening complications, including whether or not the patient would be  considered a candidate for the use of cardiopulmonary bypass and/or conversion to open sternotomy for attempted surgical intervention.  I do not think she would be a  candidate for emergent sternotomy to manage any intraoperative complications given her metastatic lung cancer.  The patient has been advised of a variety of complications that might develop including but not limited to risks of death, stroke, paravalvular leak, aortic dissection or other major vascular complications, aortic annulus rupture, device embolization, cardiac rupture or perforation, mitral regurgitation, acute myocardial infarction, arrhythmia, heart block or bradycardia requiring permanent pacemaker placement, congestive heart failure, respiratory failure, renal failure, pneumonia, infection, other late complications related to structural valve deterioration or migration, or other complications that might ultimately cause a temporary or permanent loss of functional independence or other long term morbidity. The patient provides full informed consent for the procedure as described and all questions were answered.     DETAILS OF THE OPERATIVE PROCEDURE  PREPARATION:    The patient is brought to the operating room on the above mentioned date and monitoring was established by the anesthesia team including placement of a radial arterial line. The patient is placed in the supine position on the operating table.  Intravenous antibiotics are administered.  The patient was monitored by anesthesia under conscious sedation.   Baseline transthoracic echocardiogram was performed. The patient's abdomen, both groins are prepared and draped in a sterile manner. A time out procedure is performed.   PERIPHERAL ACCESS:    Using the modified Seldinger technique, femoral arterial and venous access was obtained with placement of 6 Fr sheaths on the left side.  A pigtail diagnostic catheter was passed through the left arterial sheath under fluoroscopic guidance into the aortic root.  A temporary transvenous pacemaker catheter was passed through the left femoral venous sheath under fluoroscopic guidance into the  right ventricle.  The pacemaker was tested to ensure stable lead placement and pacemaker capture.   TRANSFEMORAL ACCESS:   Percutaneous transfemoral access and sheath placement was performed using ultrasound guidance.  The right common femoral artery was cannulated using a micropuncture needle.  A pair of Abbott Perclose percutaneous closure devices were placed and a 8 French sheath replaced into the femoral artery.  The patient was heparinized systemically and ACT verified > 250 seconds.    An 18 Fr Dry-Seal sheath was introduced into the right femoral artery after progressively dilating over an Amplatz superstiff wire. An AL-1 catheter was used to direct a straight-tip exchange length wire across the native aortic valve into the left ventricle. This was exchanged out for a pigtail catheter and position was confirmed in the LV apex. Simultaneous LV and Ao pressures were recorded.  The pigtail catheter was exchanged for a Confida wire in the LV apex.     BALLOON AORTIC VALVULOPLASTY:   Not performed   TRANSCATHETER HEART VALVE DEPLOYMENT:   A Medtronic Evolut Pro + transcatheter heart valve (size 23 mm, serial #[id number]) was prepared and crimped per manufacturer's guidelines, and the proper orientation of the valve is confirmed on the Medtronic Evolut Pro delivery system. The valve was advanced through the introducer sheath using normal technique until in an appropriate position in the abdominal aorta beyond the sheath tip. The valve was then advanced across the aortic arch. The valve was carefully positioned across the aortic valve annulus. The paralax was removed from the delivery system by moving the detector into the LAO postion.  The valve is carefully deployed using the retractor dial so that 80%  of the valve is released. TEE demonstrated trace paravalvular AI and the patient's heart rhythm was stable. The valve was fully released during pacing at 120 bpm. There was felt to be trace paravalvular  leak and no central aortic insufficiency. The patient's hemodynamic recovery following valve deployment is good.  The delivery catheter and guidewire were both removed.    PROCEDURE COMPLETION:   The sheath was removed and femoral artery closure performed using the previously placed Perclose devices. Protamine was administered once femoral arterial repair was complete. The temporary pacemaker, pigtail catheters and femoral sheaths were removed with manual pressure used for hemostasis. A Mynx closure device was used for the left femoral artery.   The patient tolerated the procedure well and is transported to the cath lab recovery area in stable condition. There were no immediate intraoperative complications. All sponge instrument and needle counts are verified correct at completion of the operation.   No blood products were administered during the operation.  The patient received a total of 60 mL of intravenous contrast during the procedure.   Gaye Pollack, MD

## 2018-12-02 NOTE — CV Procedure (Signed)
[facility]  TAVR OPERATIVE NOTE   Date of Procedure:  12/02/2018  Preoperative Diagnosis: Severe Aortic Stenosis   Postoperative Diagnosis: Same   Procedure:    Transcatheter Aortic Valve Replacement - Transfemoral Approach  Medtronic Evolut Pro (size 23 mm, model # [id number], serial # [id number])   Co-Surgeons:  Lauree Chandler, MD and Gaye Pollack, MD   Anesthesiologist:  Roanna Banning  Echocardiographer:  Meda Coffee  Pre-operative Echo Findings:  Severe aortic stenosis  Normal left ventricular systolic function  Post-operative Echo Findings:  Trivial paravalvular leak  Normal left ventricular systolic function  BRIEF CLINICAL NOTE AND INDICATIONS FOR SURGERY  83 yo female with history of CAD, anemia, gastric cancer, lung cancer, carotid artery disease, diabetes mellitus, hyperlipidemia, HTN and severe aortic stenosis. She is known to have severe aortic stenosis. Echocardiogram August 2018 with normal LV systolic function, GYBW=38%, moderate LVH. Severe aortic stenosis with mean gradient 43 mmHg. Cardiac cath June 2019 at [facility] per Dr. Clayborn Bigness with severe three vessel CAD.  At her first visit in my office in June 2019, we discussed her valve disease and planned PCI. She underwent cardiac cath 10/15/17 with drug eluting stent placement in the distal RCA and orbital atherectomy with drug eluting stent placement in the mid Circumflex. She was discharged on ASA and Plavix. Her chest pain improved following her PCI. Plans to consider TAVR after she recovered from her PCI. Echo 01/20/18 with LVEF=65-70%, severe AS with mean gradient of 54 mmHg, peak gradient 91 mmHg, moderate AI. AVA 0.66 cm2. She has had gastric cancer and is s/p partial gastrectomy in 2018. She has had lung cancer is s/p right lower lobectomy in 2017. During workup for TAVR she was found to have recurrence of her lung cancer so her TAVR was delayed. She was treated with oral therapy and has had two  thoracenteses. Most recent echo shows normal LVEF with mean gradient 42 mmHg. The aortic valve leaflets are thickened and calcified with limited mobility. AVA 0.68 cm2. She has recently been seen by her oncologist Dr. Rogue Bussing and while her cancer has not regressed, it has not gotten worse by most recent scans.   During the course of the patient's preoperative work up they have been evaluated comprehensively by a multidisciplinary team of specialists coordinated through the [facility] the [facility].  They have been demonstrated to suffer from symptomatic severe aortic stenosis as noted above. The patient has been counseled extensively as to the relative risks and benefits of all options for the treatment of severe aortic stenosis including long term medical therapy, conventional surgery for aortic valve replacement, and transcatheter aortic valve replacement.  The patient has been independently evaluated by Dr. Cyndia Bent with CT surgery and they are felt to be at high risk for conventional surgical aortic valve replacement. The surgeon indicated the patient would be a poor candidate for conventional surgery. Based upon review of all of the patient's preoperative diagnostic tests they are felt to be candidate for transcatheter aortic valve replacement using the transfemoral approach as an alternative to high risk conventional surgery.    Following the decision to proceed with transcatheter aortic valve replacement, a discussion has been held regarding what types of management strategies would be attempted intraoperatively in the event of life-threatening complications, including whether or not the patient would be considered a candidate for the use of cardiopulmonary bypass and/or conversion to open sternotomy for attempted surgical intervention.  The patient has  been advised of a variety of complications that might develop peculiar to this approach  including but not limited to risks of death, stroke, paravalvular leak, aortic dissection or other major vascular complications, aortic annulus rupture, device embolization, cardiac rupture or perforation, acute myocardial infarction, arrhythmia, heart block or bradycardia requiring permanent pacemaker placement, congestive heart failure, respiratory failure, renal failure, pneumonia, infection, other late complications related to structural valve deterioration or migration, or other complications that might ultimately cause a temporary or permanent loss of functional independence or other long term morbidity.  The patient provides full informed consent for the procedure as described and all questions were answered preoperatively.    DETAILS OF THE OPERATIVE PROCEDURE  PREPARATION:   The patient is brought to the operating room on the above mentioned date and central monitoring was established by the anesthesia team including placement of a radial arterial line. The patient is placed in the supine position on the operating table.  Intravenous antibiotics are administered. Conscious sedation is used.   Baseline transthoracic echocardiogram was performed. The patient's chest, abdomen, both groins, and both lower extremities are prepared and draped in a sterile manner. A time out procedure is performed.   PERIPHERAL ACCESS:   Using the modified Seldinger technique, femoral arterial and venous access were obtained with placement of 6 Fr sheaths on the left side using u/s guidance.  A pigtail diagnostic catheter was passed through the femoral arterial sheath under fluoroscopic guidance into the aortic root.  A temporary transvenous pacemaker catheter was passed through the femoral venous sheath under fluoroscopic guidance into the right ventricle.  The pacemaker was tested to ensure stable lead placement and pacemaker capture. Aortic root angiography was performed in order to determine the optimal  angiographic angle for valve deployment.  TRANSFEMORAL ACCESS:  A micropuncture kit was used to gain access to the right femoral artery. Position confirmed with angiography. Pre-closure with double ProGlide closure devices. The patient was heparinized systemically and ACT verified > 250 seconds.    A 18 Fr Dry Seal Sheath was introduced into the right femoral artery after progressively dilating over an Amplatz superstiff wire. An AL-1 catheter was used to direct a straight-tip exchange length wire across the native aortic valve into the left ventricle. This was exchanged out for a pigtail catheter and position was confirmed in the LV apex. Simultaneous LV and Ao pressures were recorded.  The pigtail catheter was then exchanged for a Confienza wire in the LV apex.   TRANSCATHETER HEART VALVE DEPLOYMENT:  An Medtronic Evolut Pro size 23 mm was prepared per manufacturer's guidelines, and the proper orientation of the valve is confirmed on the delivery system. The valve was advanced through the introducer sheath and into the thoracic aorta. Valve position across the aortic valve confirmed with angiographic assessment. The valve is deployed with moderate pacing. There is felt to be tirvial paravalvular leak and no central aortic insufficiency by TTE. The patient's hemodynamic recovery following valve deployment is good.  The deployment balloon and guidewire are both removed. Echo demostrated acceptable post-procedural gradients, stable mitral valve function, and trivial AI.   PROCEDURE COMPLETION:  The sheath was then removed and closure devices were completed. Protamine was administered once femoral arterial repair was complete. The temporary pacemaker, pigtail catheters and femoral sheaths were removed with Mynx closure devices placed in the artery and manual pressure used for venous hemostasis.    The patient tolerated the procedure well and is transported to the surgical intensive care in stable  condition. There were no immediate intraoperative complications. All sponge instrument and needle counts are verified correct at completion of the operation.   No blood products were administered during the operation.  The patient received a total of 60 mL of intravenous contrast during the procedure.  Lauree Chandler MD 12/02/2018 3:25 PM

## 2018-12-02 NOTE — Code Documentation (Signed)
83 yo female code stroke with new onset RUE,RLE, R face numbness and weakness with dysarthria. Stacy Cook is s/p TAVR today and left recovery at 1700. At approximately 1830, she noticed she was feeling numb in her right arm and leg. She stated it felt "asleep". LSW 1830. NIHSS 4 (RUE 1, RLE 1, Sensory 1, Dysarthria 1). Dr. Rory Percy notified and code stroke initiated. Dr. Rory Percy came to bedside.CTH/CTA/CTP done. Pt is not a TPA candidate and no LVO on CTA/CTP. MRI ordered.  Pt returned to 4E26. Dr. Rory Percy explained findings to Stacy Cook.

## 2018-12-02 NOTE — Anesthesia Postprocedure Evaluation (Signed)
Anesthesia Post Note  Patient: Stacy Cook  Procedure(s) Performed: TRANSCATHETER AORTIC VALVE REPLACEMENT, TRANSFEMORAL (N/A ) TRANSESOPHAGEAL ECHOCARDIOGRAM (TEE) (N/A )     Patient location during evaluation: Cath Lab Anesthesia Type: MAC Level of consciousness: awake Pain management: pain level controlled Vital Signs Assessment: post-procedure vital signs reviewed and stable Respiratory status: spontaneous breathing, nonlabored ventilation, respiratory function stable and patient connected to nasal cannula oxygen Cardiovascular status: stable and blood pressure returned to baseline Postop Assessment: no apparent nausea or vomiting Anesthetic complications: no    Last Vitals:  Vitals:   12/02/18 1800 12/02/18 1815  BP: (!) 120/98 (!) 136/41  Pulse: (!) 49 (!) 53  Resp: 17 12  Temp:    SpO2: 97%     Last Pain:  Vitals:   12/02/18 1815  TempSrc:   PainSc: 0-No pain                 Ryan P Ellender

## 2018-12-02 NOTE — Discharge Instructions (Signed)
ACTIVITY AND EXERCISE °• Daily activity and exercise are an important part of your recovery. People recover at different rates depending on their general health and type of valve procedure. °• Most people recovering from TAVR feel better relatively quickly  °• No lifting, pushing, pulling more than 10 pounds (examples to avoid: groceries, vacuuming, gardening, golfing): °            - For one week with a procedure through the groin. °            - For six weeks for procedures through the chest wall or neck. °NOTE: You will typically see one of our providers 7-14 days after your procedure to discuss WHEN TO RESUME the above activities.  °  °  °DRIVING °• Do not drive until you are seen for follow up and cleared by a provider. Generally, we ask patient to not drive for 1 week after their procedure. °• If you have been told by your doctor in the past that you may not drive, you must talk with him/her before you begin driving again. °  °DRESSING °• Groin site: you may leave the clear dressing over the site for up to one week or until it falls off. °  °HYGIENE °• If you had a femoral (leg) procedure, you may take a shower when you return home. After the shower, pat the site dry. Do NOT use powder, oils or lotions in your groin area until the site has completely healed. °• If you had a chest procedure, you may shower when you return home unless specifically instructed not to by your discharging practitioner. °            - DO NOT scrub incision; pat dry with a towel. °            - DO NOT apply any lotions, oils, powders to the incision. °            - No tub baths / swimming for at least 2 weeks. °• If you notice any fevers, chills, increased pain, swelling, bleeding or pus, please contact your doctor. °  °ADDITIONAL INFORMATION °• If you are going to have an upcoming dental procedure, please contact our office as you will require antibiotics ahead of time to prevent infection on your heart valve.  ° ° °If you have any  questions or concerns you can call the structural heart phone during normal business hours 8am-4pm. If you have an urgent need after hours or weekends please call [phone] to talk to the on call provider for general cardiology. If you have an emergency that requires immediate attention, please call 911.  ° ° °After TAVR Checklist ° °Check  Test Description  ° Follow up appointment in 1-2 weeks  You will see our structural heart physician assistant, Katie Francy Mcilvaine. Your incision sites will be checked and you will be cleared to drive and resume all normal activities if you are doing well.    ° 1 month echo and follow up  You will have an echo to check on your new heart valve and be seen back in the office by Katie Daneka Lantigua. Many times the echo is not read by your appointment time, but Katie will call you later that day or the following day to report your results.  ° Follow up with your primary cardiologist You will need to be seen by your primary cardiologist in the following 3-6 months after your 1 month appointment in the valve   clinic. Often times your Plavix or Aspirin will be discontinued during this time, but this is decided on a case by case basis.   ° 1 year echo and follow up You will have another echo to check on your heart valve after 1 year and be seen back in the office by Katie Jennah Satchell. This your last structural heart visit.  ° Bacterial endocarditis prophylaxis  You will have to take antibiotics for the rest of your life before all dental procedures (even teeth cleanings) to protect your heart valve. Antibiotics are also required before some surgeries. Please check with your cardiologist before scheduling any surgeries. Also, please make sure to tell us if you have a penicillin allergy as you will require an alternative antibiotic.   ° ° °

## 2018-12-02 NOTE — Progress Notes (Signed)
  Echocardiogram 2D Echocardiogram has been performed.  Stacy Cook 12/02/2018, 3:21 PM

## 2018-12-02 NOTE — Progress Notes (Signed)
Pt arrived in the bed to room 126. Pt oriented x 4 alert. C/O 0 pain using numerial pain scale. Pt groin site bilaterally dressing intact, clean dry. V/S taken per order. Pt on monitor. Will continue to monitor.
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
## 2018-12-02 NOTE — Transfer of Care (Signed)
Immediate Anesthesia Transfer of Care Note  Patient: Stacy Cook  Procedure(s) Performed: TRANSCATHETER AORTIC VALVE REPLACEMENT, TRANSFEMORAL (N/A ) TRANSESOPHAGEAL ECHOCARDIOGRAM (TEE) (N/A )  Patient Location: PACU and Cath Lab  Anesthesia Type:MAC  Level of Consciousness: awake, alert  and patient cooperative  Airway & Oxygen Therapy: Patient Spontanous Breathing  Post-op Assessment: Report given to RN and Post -op Vital signs reviewed and stable  Post vital signs: Reviewed and stable  Last Vitals:  Vitals Value Taken Time  BP 129/26 12/02/18 1539  Temp    Pulse 56 12/02/18 1540  Resp 16 12/02/18 1540  SpO2 98 % 12/02/18 1540  Vitals shown include unvalidated device data.  Last Pain:  Vitals:   12/02/18 1043  TempSrc:   PainSc: 0-No pain         Complications: No apparent anesthesia complications

## 2018-12-02 NOTE — Progress Notes (Addendum)
@  approx. 2145 pt assessed during hourly rounds and found to have R sided weakness (drift in both extremities) with accompanying "numbness" per pt. Possible faint R sided facial droop also noted. RR RN paged to bedside. Code Stroke called and STAT Head CT obtained. Neurology aware and has seen pt (see Dr. Johny Chess note) and Cardiology, on-call for TAVR pts, notified. New neuro orders placed. Pt updated on plan of care. This RN offered to call pt's family with update but she endorsed she would update them in the morning. Will continue to monitor and assess per orders.  Addendum- Of Note: pt endorsed RLE "numbness"  earlier in shift during ambulation to toilet. No weakness or other deficits noted upon assessment at that time. Pt endorses first noticing numbness at approx. 1830.

## 2018-12-02 NOTE — Anesthesia Procedure Notes (Signed)
Arterial Line Insertion Start/End8/25/2020 11:00 AM, 12/02/2018 11:10 AM Performed by: Lance Coon, CRNA  Patient location: Pre-op. Preanesthetic checklist: patient identified, IV checked, site marked, risks and benefits discussed, surgical consent, monitors and equipment checked, pre-op evaluation, timeout performed and anesthesia consent Lidocaine 1% used for infiltration Right, radial was placed Catheter size: 20 G Hand hygiene performed , maximum sterile barriers used  and Seldinger technique used  Attempts: 1 Procedure performed without using ultrasound guided technique. Following insertion, dressing applied and Biopatch. Post procedure assessment: normal and unchanged  Patient tolerated the procedure well with no immediate complications.

## 2018-12-03 ENCOUNTER — Encounter (HOSPITAL_COMMUNITY): Payer: Self-pay | Admitting: Cardiovascular Disease

## 2018-12-03 ENCOUNTER — Inpatient Hospital Stay (HOSPITAL_COMMUNITY): Payer: Medicare Other

## 2018-12-03 ENCOUNTER — Other Ambulatory Visit (HOSPITAL_COMMUNITY): Payer: Medicare Other

## 2018-12-03 ENCOUNTER — Encounter: Payer: Self-pay | Admitting: Thoracic Surgery (Cardiothoracic Vascular Surgery)

## 2018-12-03 DIAGNOSIS — D696 Thrombocytopenia, unspecified: Secondary | ICD-10-CM

## 2018-12-03 DIAGNOSIS — I10 Essential (primary) hypertension: Secondary | ICD-10-CM

## 2018-12-03 DIAGNOSIS — I6523 Occlusion and stenosis of bilateral carotid arteries: Secondary | ICD-10-CM

## 2018-12-03 DIAGNOSIS — Z954 Presence of other heart-valve replacement: Secondary | ICD-10-CM

## 2018-12-03 DIAGNOSIS — I350 Nonrheumatic aortic (valve) stenosis: Secondary | ICD-10-CM

## 2018-12-03 DIAGNOSIS — E1159 Type 2 diabetes mellitus with other circulatory complications: Secondary | ICD-10-CM

## 2018-12-03 DIAGNOSIS — I5033 Acute on chronic diastolic (congestive) heart failure: Secondary | ICD-10-CM

## 2018-12-03 DIAGNOSIS — Z952 Presence of prosthetic heart valve: Secondary | ICD-10-CM

## 2018-12-03 DIAGNOSIS — Z9861 Coronary angioplasty status: Secondary | ICD-10-CM

## 2018-12-03 DIAGNOSIS — C160 Malignant neoplasm of cardia: Secondary | ICD-10-CM

## 2018-12-03 DIAGNOSIS — I6340 Cerebral infarction due to embolism of unspecified cerebral artery: Secondary | ICD-10-CM

## 2018-12-03 DIAGNOSIS — I2510 Atherosclerotic heart disease of native coronary artery without angina pectoris: Secondary | ICD-10-CM

## 2018-12-03 DIAGNOSIS — D002 Carcinoma in situ of stomach: Secondary | ICD-10-CM

## 2018-12-03 LAB — BASIC METABOLIC PANEL
Anion gap: 7 (ref 5–15)
BUN: 21 mg/dL (ref 8–23)
CO2: 21 mmol/L — ABNORMAL LOW (ref 22–32)
Calcium: 8.4 mg/dL — ABNORMAL LOW (ref 8.9–10.3)
Chloride: 111 mmol/L (ref 98–111)
Creatinine, Ser: 1.51 mg/dL — ABNORMAL HIGH (ref 0.44–1.00)
GFR calc Af Amer: 37 mL/min — ABNORMAL LOW (ref >60)
GFR calc non Af Amer: 32 mL/min — ABNORMAL LOW (ref >60)
Glucose, Bld: 91 mg/dL (ref 70–99)
Potassium: 3.9 mmol/L (ref 3.5–5.1)
Sodium: 139 mmol/L (ref 135–145)

## 2018-12-03 LAB — ECHOCARDIOGRAM COMPLETE
Height: 61 in
Weight: 1816.59 oz

## 2018-12-03 LAB — LIPID PANEL
Cholesterol: 122 mg/dL (ref 0–200)
HDL: 41 mg/dL (ref >40)
LDL Cholesterol: 69 mg/dL (ref 0–99)
Total CHOL/HDL Ratio: 3 RATIO
Triglycerides: 58 mg/dL (ref <150)
VLDL: 12 mg/dL (ref 0–40)

## 2018-12-03 LAB — HEMOGLOBIN AND HEMATOCRIT, BLOOD
HCT: 31.4 % — ABNORMAL LOW (ref 36.0–46.0)
Hemoglobin: 10.4 g/dL — ABNORMAL LOW (ref 12.0–15.0)

## 2018-12-03 LAB — CBC
HCT: 25.6 % — ABNORMAL LOW (ref 36.0–46.0)
Hemoglobin: 8.4 g/dL — ABNORMAL LOW (ref 12.0–15.0)
MCH: 30 pg (ref 26.0–34.0)
MCHC: 32.8 g/dL (ref 30.0–36.0)
MCV: 91.4 fL (ref 80.0–100.0)
Platelets: 52 10*3/uL — ABNORMAL LOW (ref 150–400)
RBC: 2.8 MIL/uL — ABNORMAL LOW (ref 3.87–5.11)
RDW: 12.3 % (ref 11.5–15.5)
WBC: 3.1 10*3/uL — ABNORMAL LOW (ref 4.0–10.5)
nRBC: 0 % (ref 0.0–0.2)

## 2018-12-03 LAB — MAGNESIUM: Magnesium: 1.7 mg/dL (ref 1.7–2.4)

## 2018-12-03 LAB — PREPARE RBC (CROSSMATCH)

## 2018-12-03 MED ORDER — SODIUM CHLORIDE 0.9 % IV BOLUS
1000.0000 mL | Freq: Once | INTRAVENOUS | Status: AC
  Administered 2018-12-03: 1000 mL via INTRAVENOUS

## 2018-12-03 MED ORDER — HEPARIN SODIUM (PORCINE) 5000 UNIT/ML IJ SOLN
5000.0000 [IU] | Freq: Two times a day (BID) | INTRAMUSCULAR | Status: DC
  Administered 2018-12-03 – 2018-12-05 (×5): 5000 [IU] via SUBCUTANEOUS
  Filled 2018-12-03 (×5): qty 1

## 2018-12-03 MED ORDER — SODIUM CHLORIDE 0.9% IV SOLUTION: Freq: Once | INTRAVENOUS | Status: DC

## 2018-12-03 NOTE — Progress Notes (Signed)
Patient ID: Stacy Cook, female   DOB: [birth year], 83 y.o.   MRN: [id number] TCTS   She is hemodynamically stable in sinus rhythm. Son is with her. MR shows left pontine infarct 10 x 12 mm and 2 small areas of acute infarct in the left frontal lobe. This is most likely embolic from aorta.  She has no active motor function in right lower extremity. She is having some spasms in right lower extremity with spontaneous motor activity. She has minimal movement in right thumb but otherwise right upper extremity is flaccid. Hopefully some of this will recover.

## 2018-12-03 NOTE — Progress Notes (Signed)
1 Day Post-Op Procedure(s) (LRB): TRANSCATHETER AORTIC VALVE REPLACEMENT, TRANSFEMORAL (N/A) TRANSESOPHAGEAL ECHOCARDIOGRAM (TEE) (N/A) Subjective: Patient developed right sided numbness last night about 6:30 pm. Code stroke initiated and stat head CT and CTA of head and neck done. This showed no acute changes and no large vessel occlusion, no perfusion defect. She remained stable overnight but on assessment early this morning had developed weakness in right upper and lower extremity. Neurology has seen and planning state MRA this am and possible intervention if this is a small vessel occlusion. She denies any chest pain or shortness of breath.  Objective: Vital signs in last 24 hours: Temp:  [97.5 F (36.4 C)-98.1 F (36.7 C)] 97.7 F (36.5 C) (08/26 0749) Pulse Rate:  [47-140] 66 (08/26 0800) Cardiac Rhythm: Sinus bradycardia;Heart block (08/26 0749) Resp:  [0-30] 12 (08/26 0800) BP: (106-169)/(24-98) 151/45 (08/26 0800) SpO2:  [0 %-100 %] 95 % (08/26 0800) Weight:  [49.5 kg-51.5 kg] 51.5 kg (08/26 0315)  Hemodynamic parameters for last 24 hours:    Intake/Output from previous day: 08/25 0701 - 08/26 0700 In: 1426.6 [P.O.:240; I.V.:971.1; IV Piggyback:215.5] Out: -  Intake/Output this shift: No intake/output data recorded.  General appearance: alert and cooperative Neurologic: right sided weakness Heart: regular rate and rhythm and 2/6 systolic flow murmur. No diastolic murmur. Lungs: diminished breath sounds RLL Extremities: extremities normal, atraumatic, no cyanosis or edema Wound: groin sites ok.  Lab Results: Recent Labs    12/02/18 1600 12/03/18 0223  WBC  --  3.1*  HGB 7.5* 8.4*  HCT 22.0* 25.6*  PLT  --  52*   BMET:  Recent Labs    12/02/18 1600 12/03/18 0223  NA 141 139  K 4.1 3.9  CL 108 111  CO2  --  21*  GLUCOSE 127* 91  BUN 22 21  CREATININE 1.50* 1.51*  CALCIUM  --  8.4*    PT/INR: No results for input(s): LABPROT, INR in the last 72  hours. ABG    Component Value Date/Time   PHART 7.374 11/28/2018 1402   HCO3 22.0 11/28/2018 1402   TCO2 22 12/02/2018 1600   ACIDBASEDEF 2.4 (H) 11/28/2018 1402   O2SAT 98.0 11/28/2018 1402   CBG (last 3)  Recent Labs    12/02/18 1031  GLUCAP 94   ECG: sinus, new LBBB postop. Observe.  Assessment/Plan: S/P Procedure(s) (LRB): TRANSCATHETER AORTIC VALVE REPLACEMENT, TRANSFEMORAL (N/A) TRANSESOPHAGEAL ECHOCARDIOGRAM (TEE) (N/A)  POD 1  Hemodynamically stable with low diastolic BP which has been a chronic problem for her although seems lower postop. She has trivial paravalvular leak in the OR and no central AI so her low diastolic is not an AI problem. She received some fluid this am by neurology and she is anemic so will give her a unit of PRBC's to get Hgb up higher. She was 8.4 this am and likely lower after IVF.  2D echo pending.  Postop stroke with worsening this am. Neurology has scheduled stat MRA. Continue to monitor closely.  Stage 3-4 CKD. Creat stable at 1.5 this am.  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 Transfusing a unit of PRBC's this am and will follow plt ct.   LOS: 1 day    Gaye Pollack 12/03/2018

## 2018-12-03 NOTE — Plan of Care (Signed)
  Problem: Education: Goal: Knowledge of disease or condition will improve Outcome: Progressing   

## 2018-12-03 NOTE — Progress Notes (Signed)
  Echocardiogram 2D Echocardiogram has been performed.  Stacy Cook 12/03/2018, 2:23 PM

## 2018-12-03 NOTE — Progress Notes (Signed)
During bedside report modified NIH noted to increase from 3 to 8. Assessment completed with Day RN, Sonia Baller, who is in agreement with score. Dr. Cheral Marker of Neurology paged with above information @[phone]   Page promptly returned, bolus ordered and instructions given to page primary for guidance on increasing diastolic in case hypoperfusion is inducing stroke like symptoms.  Dr. Cyndia Bent contacted and made aware of pt's current disposition. He subsequently collaborated with Neurology on pt's plan of care. Day RN updated on plan.  At pt's request, pt's son [name] called and updated. All questions answered and number given to Nurse Desk if he has other questions arise.

## 2018-12-03 NOTE — Progress Notes (Signed)
STROKE TEAM PROGRESS NOTE   INTERVAL HISTORY Pt seen around 8am today. RN and Dr. Cheral Marker at bedside. As per pt, she had some right sided numbness and mild weakness after the TAVR procedure. However, overnight the right sided weakness seems getting worse. Last night when Dr. Rory Percy saw her, her right side still antigravity, but this morning not able to hold right arm or leg in the air. CTA head and neck showed diffuse atherosclerosis. MRI and MRA pending.   Vitals:   12/03/18 0600 12/03/18 0700 12/03/18 0749 12/03/18 0800  BP: (!) 144/26 (!) 156/24 (!) 151/41 (!) 151/45  Pulse: (!) 53 (!) 53 67 66  Resp: 11 17 13 12   Temp:   97.7 F (36.5 C)   TempSrc:   Oral   SpO2: 100% 98% 100% 95%  Weight:      Height:        CBC:  Recent Labs  Lab 11/28/18 1401  12/02/18 1600 12/03/18 0223  WBC 3.2*  --   --  3.1*  HGB 9.5*   < > 7.5* 8.4*  HCT 29.5*   < > 22.0* 25.6*  MCV 92.8  --   --  91.4  PLT 64*  --   --  52*   < > = values in this interval not displayed.    Basic Metabolic Panel:  Recent Labs  Lab 11/28/18 1401  12/02/18 1600 12/03/18 0223  NA 141   < > 141 139  K 4.0   < > 4.1 3.9  CL 114*  --  108 111  CO2 18*  --   --  21*  GLUCOSE 95   < > 127* 91  BUN 19  --  22 21  CREATININE 1.68*  --  1.50* 1.51*  CALCIUM 8.7*  --   --  8.4*  MG  --   --   --  1.7   < > = values in this interval not displayed.   Lipid Panel:     Component Value Date/Time   CHOL 122 12/03/2018 0223   TRIG 58 12/03/2018 0223   HDL 41 12/03/2018 0223   CHOLHDL 3.0 12/03/2018 0223   VLDL 12 12/03/2018 0223   LDLCALC 69 12/03/2018 0223   HgbA1c:  Lab Results  Component Value Date   HGBA1C 5.1 11/28/2018   Urine Drug Screen: No results found for: LABOPIA, COCAINSCRNUR, LABBENZ, AMPHETMU, THCU, LABBARB  Alcohol Level No results found for: ETH  IMAGING Ct Angio Head W Or Wo Contrast  Result Date: 12/02/2018 CLINICAL DATA:  Right-sided weakness and slurred speech EXAM: CT ANGIOGRAPHY  HEAD AND NECK CT PERFUSION BRAIN TECHNIQUE: Multidetector CT imaging of the head and neck was performed using the standard protocol during bolus administration of intravenous contrast. Multiplanar CT image reconstructions and MIPs were obtained to evaluate the vascular anatomy. Carotid stenosis measurements (when applicable) are obtained utilizing NASCET criteria, using the distal internal carotid diameter as the denominator. Multiphase CT imaging of the brain was performed following IV bolus contrast injection. Subsequent parametric perfusion maps were calculated using RAPID software. CONTRAST:  46mL OMNIPAQUE IOHEXOL 350 MG/ML SOLN COMPARISON:  Head CT same day FINDINGS: CTA NECK FINDINGS SKELETON: There is no bony spinal canal stenosis. No lytic or blastic lesion. OTHER NECK: Normal pharynx, larynx and major salivary glands. No cervical lymphadenopathy. Unremarkable thyroid gland. UPPER CHEST: Incompletely visualized right pleural effusion. AORTIC ARCH: There is mild calcific atherosclerosis of the aortic arch. There is no aneurysm, dissection or hemodynamically  significant stenosis of the visualized ascending aorta and aortic arch. Conventional 3 vessel aortic branching pattern. The visualized proximal subclavian arteries are widely patent. RIGHT CAROTID SYSTEM: --Common carotid artery: Widely patent origin without common carotid artery dissection or aneurysm. --Internal carotid artery: No dissection, occlusion or aneurysm. Mild atherosclerotic calcification at the carotid bifurcation without hemodynamically significant stenosis. --External carotid artery: No acute abnormality. LEFT CAROTID SYSTEM: --Common carotid artery: Widely patent origin without common carotid artery dissection or aneurysm. --Internal carotid artery: No dissection, occlusion or aneurysm. There is mixed density atherosclerosis extending into the proximal ICA, resulting in less than 50% stenosis. --External carotid artery: No acute  abnormality. VERTEBRAL ARTERIES: Left dominant configuration. Both origins are clearly patent. No dissection, occlusion or flow-limiting stenosis to the skull base (V1-V3 segments). CTA HEAD FINDINGS POSTERIOR CIRCULATION: --Vertebral arteries: Calcification both V4 segments with mild-to-moderate narrowing bilaterally. --Posterior inferior cerebellar arteries (PICA): The left PICA and AICA share a common origin. Normal origin of right PICA from the ipsilateral vertebral artery. --Anterior inferior cerebellar arteries (AICA): Patent origins from the basilar artery. --Basilar artery: Normal. --Superior cerebellar arteries: Normal. --Posterior cerebral arteries (PCA): Normal. The left PCA is partially supplied by a posterior communicating artery (p-comm). ANTERIOR CIRCULATION: --Intracranial internal carotid arteries: Atherosclerotic calcification of the internal carotid arteries at the skull base without hemodynamically significant stenosis. --Anterior cerebral arteries (ACA): Normal. Both A1 segments are present. Patent anterior communicating artery (a-comm). --Middle cerebral arteries (MCA): Normal. VENOUS SINUSES: As permitted by contrast timing, patent. ANATOMIC VARIANTS: None Review of the MIP images confirms the above findings. CT Brain Perfusion Findings: ASPECTS: 10 CBF (<30%) Volume: 44mL Perfusion (Tmax>6.0s) volume: 44mL Mismatch Volume: 34mL Infarction Location:None IMPRESSION: 1. Normal CT perfusion scan of the brain. 2. No emergent large vessel occlusion or hemodynamically significant stenosis. 3. Bilateral carotid bifurcation atherosclerosis and atherosclerosis of the internal carotid arteries and vertebral arteries at the skull base without high-grade stenosis. 4. Incompletely visualized right pleural effusion. 5.  Aortic atherosclerosis (ICD10-I70.0). Electronically Signed   By: Ulyses Jarred M.D.   On: 12/02/2018 22:42   Ct Angio Neck W Or Wo Contrast  Result Date: 12/02/2018 CLINICAL DATA:   Right-sided weakness and slurred speech EXAM: CT ANGIOGRAPHY HEAD AND NECK CT PERFUSION BRAIN TECHNIQUE: Multidetector CT imaging of the head and neck was performed using the standard protocol during bolus administration of intravenous contrast. Multiplanar CT image reconstructions and MIPs were obtained to evaluate the vascular anatomy. Carotid stenosis measurements (when applicable) are obtained utilizing NASCET criteria, using the distal internal carotid diameter as the denominator. Multiphase CT imaging of the brain was performed following IV bolus contrast injection. Subsequent parametric perfusion maps were calculated using RAPID software. CONTRAST:  58mL OMNIPAQUE IOHEXOL 350 MG/ML SOLN COMPARISON:  Head CT same day FINDINGS: CTA NECK FINDINGS SKELETON: There is no bony spinal canal stenosis. No lytic or blastic lesion. OTHER NECK: Normal pharynx, larynx and major salivary glands. No cervical lymphadenopathy. Unremarkable thyroid gland. UPPER CHEST: Incompletely visualized right pleural effusion. AORTIC ARCH: There is mild calcific atherosclerosis of the aortic arch. There is no aneurysm, dissection or hemodynamically significant stenosis of the visualized ascending aorta and aortic arch. Conventional 3 vessel aortic branching pattern. The visualized proximal subclavian arteries are widely patent. RIGHT CAROTID SYSTEM: --Common carotid artery: Widely patent origin without common carotid artery dissection or aneurysm. --Internal carotid artery: No dissection, occlusion or aneurysm. Mild atherosclerotic calcification at the carotid bifurcation without hemodynamically significant stenosis. --External carotid artery: No acute abnormality. LEFT CAROTID SYSTEM: --Common  carotid artery: Widely patent origin without common carotid artery dissection or aneurysm. --Internal carotid artery: No dissection, occlusion or aneurysm. There is mixed density atherosclerosis extending into the proximal ICA, resulting in less  than 50% stenosis. --External carotid artery: No acute abnormality. VERTEBRAL ARTERIES: Left dominant configuration. Both origins are clearly patent. No dissection, occlusion or flow-limiting stenosis to the skull base (V1-V3 segments). CTA HEAD FINDINGS POSTERIOR CIRCULATION: --Vertebral arteries: Calcification both V4 segments with mild-to-moderate narrowing bilaterally. --Posterior inferior cerebellar arteries (PICA): The left PICA and AICA share a common origin. Normal origin of right PICA from the ipsilateral vertebral artery. --Anterior inferior cerebellar arteries (AICA): Patent origins from the basilar artery. --Basilar artery: Normal. --Superior cerebellar arteries: Normal. --Posterior cerebral arteries (PCA): Normal. The left PCA is partially supplied by a posterior communicating artery (p-comm). ANTERIOR CIRCULATION: --Intracranial internal carotid arteries: Atherosclerotic calcification of the internal carotid arteries at the skull base without hemodynamically significant stenosis. --Anterior cerebral arteries (ACA): Normal. Both A1 segments are present. Patent anterior communicating artery (a-comm). --Middle cerebral arteries (MCA): Normal. VENOUS SINUSES: As permitted by contrast timing, patent. ANATOMIC VARIANTS: None Review of the MIP images confirms the above findings. CT Brain Perfusion Findings: ASPECTS: 10 CBF (<30%) Volume: 38mL Perfusion (Tmax>6.0s) volume: 33mL Mismatch Volume: 80mL Infarction Location:None IMPRESSION: 1. Normal CT perfusion scan of the brain. 2. No emergent large vessel occlusion or hemodynamically significant stenosis. 3. Bilateral carotid bifurcation atherosclerosis and atherosclerosis of the internal carotid arteries and vertebral arteries at the skull base without high-grade stenosis. 4. Incompletely visualized right pleural effusion. 5.  Aortic atherosclerosis (ICD10-I70.0). Electronically Signed   By: Ulyses Jarred M.D.   On: 12/02/2018 22:42   Mr Angio Head Wo  Contrast  Result Date: 12/03/2018 CLINICAL DATA:  Stroke.  Right-sided numbness EXAM: MRI HEAD WITHOUT CONTRAST MRA HEAD WITHOUT CONTRAST TECHNIQUE: Multiplanar, multiecho pulse sequences of the brain and surrounding structures were obtained without intravenous contrast. Angiographic images of the head were obtained using MRA technique without contrast. COMPARISON:  CTA and CT perfusion 12/02/2018 FINDINGS: MRI HEAD FINDINGS Brain: Acute infarct left anterior pons approximately 10 x 12 mm. 2 small areas of acute infarct in the left frontal lobe over the convexity, under 5 mm each. Ventricle size and cerebral volume normal. Mild chronic microvascular ischemic type changes in the white matter. Negative for hemorrhage or mass or fluid collection. Vascular: Normal arterial flow voids Skull and upper cervical spine: Negative Sinuses/Orbits: Mild mucosal edema paranasal sinuses. Bilateral cataract surgery Other: None MRA HEAD FINDINGS Mild atherosclerotic disease distal vertebral artery bilaterally causing mild stenosis. Right PICA patent. Left PICA patent. Prominent AICA bilaterally. Basilar widely patent. Superior cerebellar and posterior cerebral arteries patent bilaterally. Posterior communicating artery patent bilaterally. Atherosclerotic disease in the cavernous carotid bilaterally without significant stenosis. Anterior and middle cerebral arteries are patent bilaterally without occlusion. Mild irregularity and stenosis M2 branches bilaterally. Negative for cerebral aneurysm. IMPRESSION: 1. 10 x 12 mm acute infarct left anterior pons. 2. Punctate areas of acute infarct in the left frontal lobe. 3. Mild chronic microvascular ischemic change in the white matter. 4. Mild intracranial atherosclerotic disease without large vessel occlusion. Mild stenosis distal vertebral artery bilaterally. Basilar appears widely patent. Electronically Signed   By: Franchot Gallo M.D.   On: 12/03/2018 11:07   Mr Brain Wo  Contrast  Result Date: 12/03/2018 CLINICAL DATA:  Stroke.  Right-sided numbness EXAM: MRI HEAD WITHOUT CONTRAST MRA HEAD WITHOUT CONTRAST TECHNIQUE: Multiplanar, multiecho pulse sequences of the brain and surrounding structures were  obtained without intravenous contrast. Angiographic images of the head were obtained using MRA technique without contrast. COMPARISON:  CTA and CT perfusion 12/02/2018 FINDINGS: MRI HEAD FINDINGS Brain: Acute infarct left anterior pons approximately 10 x 12 mm. 2 small areas of acute infarct in the left frontal lobe over the convexity, under 5 mm each. Ventricle size and cerebral volume normal. Mild chronic microvascular ischemic type changes in the white matter. Negative for hemorrhage or mass or fluid collection. Vascular: Normal arterial flow voids Skull and upper cervical spine: Negative Sinuses/Orbits: Mild mucosal edema paranasal sinuses. Bilateral cataract surgery Other: None MRA HEAD FINDINGS Mild atherosclerotic disease distal vertebral artery bilaterally causing mild stenosis. Right PICA patent. Left PICA patent. Prominent AICA bilaterally. Basilar widely patent. Superior cerebellar and posterior cerebral arteries patent bilaterally. Posterior communicating artery patent bilaterally. Atherosclerotic disease in the cavernous carotid bilaterally without significant stenosis. Anterior and middle cerebral arteries are patent bilaterally without occlusion. Mild irregularity and stenosis M2 branches bilaterally. Negative for cerebral aneurysm. IMPRESSION: 1. 10 x 12 mm acute infarct left anterior pons. 2. Punctate areas of acute infarct in the left frontal lobe. 3. Mild chronic microvascular ischemic change in the white matter. 4. Mild intracranial atherosclerotic disease without large vessel occlusion. Mild stenosis distal vertebral artery bilaterally. Basilar appears widely patent. Electronically Signed   By: Franchot Gallo M.D.   On: 12/03/2018 11:07   Ct Cerebral Perfusion W  Contrast  Result Date: 12/02/2018 CLINICAL DATA:  Right-sided weakness and slurred speech EXAM: CT ANGIOGRAPHY HEAD AND NECK CT PERFUSION BRAIN TECHNIQUE: Multidetector CT imaging of the head and neck was performed using the standard protocol during bolus administration of intravenous contrast. Multiplanar CT image reconstructions and MIPs were obtained to evaluate the vascular anatomy. Carotid stenosis measurements (when applicable) are obtained utilizing NASCET criteria, using the distal internal carotid diameter as the denominator. Multiphase CT imaging of the brain was performed following IV bolus contrast injection. Subsequent parametric perfusion maps were calculated using RAPID software. CONTRAST:  37mL OMNIPAQUE IOHEXOL 350 MG/ML SOLN COMPARISON:  Head CT same day FINDINGS: CTA NECK FINDINGS SKELETON: There is no bony spinal canal stenosis. No lytic or blastic lesion. OTHER NECK: Normal pharynx, larynx and major salivary glands. No cervical lymphadenopathy. Unremarkable thyroid gland. UPPER CHEST: Incompletely visualized right pleural effusion. AORTIC ARCH: There is mild calcific atherosclerosis of the aortic arch. There is no aneurysm, dissection or hemodynamically significant stenosis of the visualized ascending aorta and aortic arch. Conventional 3 vessel aortic branching pattern. The visualized proximal subclavian arteries are widely patent. RIGHT CAROTID SYSTEM: --Common carotid artery: Widely patent origin without common carotid artery dissection or aneurysm. --Internal carotid artery: No dissection, occlusion or aneurysm. Mild atherosclerotic calcification at the carotid bifurcation without hemodynamically significant stenosis. --External carotid artery: No acute abnormality. LEFT CAROTID SYSTEM: --Common carotid artery: Widely patent origin without common carotid artery dissection or aneurysm. --Internal carotid artery: No dissection, occlusion or aneurysm. There is mixed density atherosclerosis  extending into the proximal ICA, resulting in less than 50% stenosis. --External carotid artery: No acute abnormality. VERTEBRAL ARTERIES: Left dominant configuration. Both origins are clearly patent. No dissection, occlusion or flow-limiting stenosis to the skull base (V1-V3 segments). CTA HEAD FINDINGS POSTERIOR CIRCULATION: --Vertebral arteries: Calcification both V4 segments with mild-to-moderate narrowing bilaterally. --Posterior inferior cerebellar arteries (PICA): The left PICA and AICA share a common origin. Normal origin of right PICA from the ipsilateral vertebral artery. --Anterior inferior cerebellar arteries (AICA): Patent origins from the basilar artery. --Basilar artery: Normal. --Superior cerebellar  arteries: Normal. --Posterior cerebral arteries (PCA): Normal. The left PCA is partially supplied by a posterior communicating artery (p-comm). ANTERIOR CIRCULATION: --Intracranial internal carotid arteries: Atherosclerotic calcification of the internal carotid arteries at the skull base without hemodynamically significant stenosis. --Anterior cerebral arteries (ACA): Normal. Both A1 segments are present. Patent anterior communicating artery (a-comm). --Middle cerebral arteries (MCA): Normal. VENOUS SINUSES: As permitted by contrast timing, patent. ANATOMIC VARIANTS: None Review of the MIP images confirms the above findings. CT Brain Perfusion Findings: ASPECTS: 10 CBF (<30%) Volume: 52mL Perfusion (Tmax>6.0s) volume: 9mL Mismatch Volume: 19mL Infarction Location:None IMPRESSION: 1. Normal CT perfusion scan of the brain. 2. No emergent large vessel occlusion or hemodynamically significant stenosis. 3. Bilateral carotid bifurcation atherosclerosis and atherosclerosis of the internal carotid arteries and vertebral arteries at the skull base without high-grade stenosis. 4. Incompletely visualized right pleural effusion. 5.  Aortic atherosclerosis (ICD10-I70.0). Electronically Signed   By: Ulyses Jarred M.D.    On: 12/02/2018 22:42   Dg Chest Port 1 View  Result Date: 12/02/2018 CLINICAL DATA:  Status post transcatheter aortic valve replacement. EXAM: PORTABLE CHEST 1 VIEW COMPARISON:  Radiographs of November 28, 2018. FINDINGS: Stable cardiomediastinal silhouette. Aortic valve prosthesis is noted. Atherosclerosis of thoracic aorta is noted. No pneumothorax is noted. Left lung is clear. Small right pleural effusion is noted with associated atelectasis. Bony thorax is unremarkable. IMPRESSION: Stable small right pleural effusion is noted with associated atelectasis. Aortic Atherosclerosis (ICD10-I70.0). Electronically Signed   By: Marijo Conception M.D.   On: 12/02/2018 18:50   Ct Head Code Stroke Wo Contrast  Result Date: 12/02/2018 CLINICAL DATA:  Code stroke.  Right-sided weakness EXAM: CT HEAD WITHOUT CONTRAST TECHNIQUE: Contiguous axial images were obtained from the base of the skull through the vertex without intravenous contrast. COMPARISON:  None. FINDINGS: Brain: There is no mass, hemorrhage or extra-axial collection. The size and configuration of the ventricles and extra-axial CSF spaces are normal. The brain parenchyma is normal, without evidence of acute or chronic infarction. Vascular: Atherosclerotic calcification of the vertebral and internal carotid arteries at the skull base. No abnormal hyperdensity of the major intracranial arteries or dural venous sinuses. Skull: The visualized skull base, calvarium and extracranial soft tissues are normal. Sinuses/Orbits: No fluid levels or advanced mucosal thickening of the visualized paranasal sinuses. No mastoid or middle ear effusion. The orbits are normal. ASPECTS [facility] Stroke Program Early CT Score) - Ganglionic level infarction (caudate, lentiform nuclei, internal capsule, insula, M1-M3 cortex): 7 - Supraganglionic infarction (M4-M6 cortex): 3 Total score (0-10 with 10 being normal): 10 IMPRESSION: 1. No acute abnormality. 2. ASPECTS is 10. These results  were communicated to Dr. Amie Portland at 10:32 pm on 12/02/2018 by text page via the [facility] messaging system. Electronically Signed   By: Ulyses Jarred M.D.   On: 12/02/2018 22:32    PHYSICAL EXAM  Temp:  [97.5 F (36.4 C)-98.1 F (36.7 C)] 97.7 F (36.5 C) (08/26 0749) Pulse Rate:  [47-140] 66 (08/26 0800) Resp:  [0-30] 12 (08/26 0800) BP: (106-169)/(24-98) 151/45 (08/26 0800) SpO2:  [0 %-100 %] 95 % (08/26 0800) Weight:  [51.5 kg] 51.5 kg (08/26 0315)  General - Well nourished, well developed, in no apparent distress.  Ophthalmologic - fundi not visualized due to noncooperation.  Cardiovascular - Regular rate and rhythm.  Mental Status -  Level of arousal and orientation to time, place, and person were intact. Language including expression, naming, repetition, comprehension was assessed and found intact. Mild dysarthria Fund of Knowledge  was assessed and was intact.  Cranial Nerves II - XII - II - Visual field intact OU. III, IV, VI - Extraocular movements intact. V - Facial sensation intact bilaterally. VII - right mild facial droop. VIII - Hearing & vestibular intact bilaterally. X - Palate elevates symmetrically. XI - Chin turning & shoulder shrug intact bilaterally. XII - Tongue protrusion intact.  Motor Strength - The patient's strength was normal in left upper and lower extremities however, right UE 2+/5 and right LE 3-/5.  Bulk was normal and fasciculations were absent.   Motor Tone - Muscle tone was assessed at the neck and appendages and was normal.  Reflexes - The patient's reflexes were symmetrical in all extremities and she had no pathological reflexes.  Sensory - Light touch, temperature/pinprick were assessed and were mildly decreased on the right.    Coordination - The patient had normal movements in the left hands with no ataxia or dysmetria.  Tremor was absent.  Gait and Station - deferred.   ASSESSMENT/PLAN Ms. Stacy Cook is a 83 y.o. female  with history of multiple cancers including that of right lung, stomach, hypertension, hyperlipidemia, severe aortic stenosis status post TAVR 12/02/18 who developed right-sided numbness and possible slurred speech.   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 ASPECTS 10.     CTA head & neck no ELVO.  Extensive atherosclerosis involving aortic arch, b/l ICA bifurcation and siphon and V4.    CT perfusion normal  MRI L anterior pontine infarct.  Punctate acute infarcts L frontal lobe.  MRA mild intracranial atherosclerosis without ELVO.  Mild distal B VA stenosis.  2D Echo EF > 65%  LDL 69  HgbA1c 5.1  Heparin subq for VTE prophylaxis  aspirin 81 mg daily and clopidogrel 75 mg daily prior to admission, now on aspirin 81 mg daily and clopidogrel 75 mg daily. Continue DAPT as per cardiology  Therapy recommendations:  pending   Disposition:  pending   Severe AS s/p TAVR   12/02/18 tolerating well  Repeat TTE pending  Concerning current stroke related to procedure  On DAPT  Hypertension  Stable . From stroke standpoint okay for permissive hypertension (OK if < 180/105) but gradually normalize in 5-7 days . 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 1 unit transfusion. Hb 7.1->8.4   Hospital day # 1  Neurology will sign off. Please call with questions. Pt will follow up with stroke clinic NP at [facility] in about 4 weeks. Thanks for the consult.  Rosalin Hawking, MD PhD Stroke Neurology 12/03/2018 12:15 PM   To contact Stroke Continuity provider, please refer to [email or web address] After hours, contact General Neurology

## 2018-12-03 NOTE — Progress Notes (Signed)
PT Cancellation Note  Patient Details Name: Payson Crumby MRN: [id number] DOB: Jan 30, 1936   Cancelled Treatment:    Reason Eval/Treat Not Completed: Patient not medically ready. RN states patient is receiving blood. Asks that I hold evaluation until tomorrow.     Marly Schuld 12/03/2018, 3:20 PM

## 2018-12-03 NOTE — Progress Notes (Signed)
Pt given swallow test. Passed with no issues.  Jerald Kief, RN

## 2018-12-03 NOTE — Progress Notes (Signed)
SLP Cancellation Note  Patient Details Name: Stacy Cook MRN: [id number] DOB: Jul 22, 1935   Cancelled treatment:       Reason Eval/Treat Not Completed: Patient at procedure or test/unavailable(Pt having echo completed at this time. SLP will follow up. )  Lander Eslick I. Hardin Negus, [place], [place] Office number [phone] Pager [phone]  Horton Marshall 12/03/2018, 1:55 PM

## 2018-12-03 NOTE — Progress Notes (Signed)
OT Cancellation Note  Patient Details Name: Deniece Rankin MRN: [id number] DOB: 19-Oct-1935   Cancelled Treatment:    Reason Eval/Treat Not Completed: Patient at procedure or test/ unavailable- off unit at MRI.  Will follow up as able.   Delight Stare, OT Acute Rehabilitation Services Pager [phone] Office [phone]   Delight Stare 12/03/2018, 9:00 AM

## 2018-12-03 NOTE — Progress Notes (Signed)
SLP Cancellation Note  Patient Details Name: Stacy Cook MRN: [id number] DOB: 03-18-1936   Cancelled treatment:       Reason Eval/Treat Not Completed: Patient at procedure or test/unavailable - going down for MRI per RN. Pt has not yet had her Yale swallow screen but RN plans to do this. Will f/u for speech/language evaluation as able.   Venita Sheffield Ziad Maye 12/03/2018, 8:51 AM  Pollyann Glen, M.A. [place] Acute Environmental education officer [phone] Office [phone]
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
## 2018-12-03 NOTE — Progress Notes (Signed)
  Speech Language Pathology Treatment: Cognitive-Linquistic(Dysarthria)  Patient Details Name: Stacy Cook MRN: [id number] DOB: 10-Sep-1935 Today's Date: 12/03/2018 Time: 0802-2336 SLP Time Calculation (min) (ACUTE ONLY): 17 min  Assessment / Plan / Recommendation Clinical Impression  Pt was seen for dysarthria treatment and was cooperative during the session. Pt's son arrived at the beginning of the session and agreed that the pt's speech is approximately 70% back to baseline and stated that it has improved since yesterday. Both parties were educated regarding the nature of dysarthria, the severity of the pt's deficits, and compensatory strategies to improve speech intelligibility. Pt and her son verbalized understanding regarding all areas of education and all their questions were answered. She used compensatory strategies at the 5-7 word sentence level with 70% accuracy increasing to 100% accuracy with min-mod cues for overarticulation and rate. She required moderate cues during conversation. SLP will continue to follow pt.     HPI HPI: Pt is an 83 y.o. female with past medical history of multiple cancers including that of right lung, stomach, hypertension, hyperlipidemia, severe aortic stenosis status post TAVR on 12/02/18 who started noticing right-sided numbness around 6:30 PM. Code stroke was called and MRI revealed 10 x 12 mm acute infarct left anterior pons and punctate areas of acute infarct in the left frontal lobe.      SLP Plan  Continue with current plan of care  Patient needs continued Speech Lanaguage Pathology Services    Recommendations                   Follow up Recommendations: (Continued SLP services at level of care recommended by PT/OT) SLP Visit Diagnosis: Dysarthria and anarthria (R47.1) Plan: Continue with current plan of care       Stacy Cook, [place], [place] Office number [phone] Pager [place] 12/03/2018, 2:52 PM

## 2018-12-04 LAB — TYPE AND SCREEN
ABO/RH(D): O POS
Antibody Screen: NEGATIVE
Unit division: 0

## 2018-12-04 LAB — CBC
HCT: 29.7 % — ABNORMAL LOW (ref 36.0–46.0)
Hemoglobin: 10.1 g/dL — ABNORMAL LOW (ref 12.0–15.0)
MCH: 30.3 pg (ref 26.0–34.0)
MCHC: 34 g/dL (ref 30.0–36.0)
MCV: 89.2 fL (ref 80.0–100.0)
Platelets: 51 10*3/uL — ABNORMAL LOW (ref 150–400)
RBC: 3.33 MIL/uL — ABNORMAL LOW (ref 3.87–5.11)
RDW: 12.5 % (ref 11.5–15.5)
WBC: 3.3 10*3/uL — ABNORMAL LOW (ref 4.0–10.5)
nRBC: 0 % (ref 0.0–0.2)

## 2018-12-04 LAB — BASIC METABOLIC PANEL
Anion gap: 9 (ref 5–15)
BUN: 18 mg/dL (ref 8–23)
CO2: 19 mmol/L — ABNORMAL LOW (ref 22–32)
Calcium: 8.4 mg/dL — ABNORMAL LOW (ref 8.9–10.3)
Chloride: 113 mmol/L — ABNORMAL HIGH (ref 98–111)
Creatinine, Ser: 1.45 mg/dL — ABNORMAL HIGH (ref 0.44–1.00)
GFR calc Af Amer: 39 mL/min — ABNORMAL LOW (ref >60)
GFR calc non Af Amer: 34 mL/min — ABNORMAL LOW (ref >60)
Glucose, Bld: 92 mg/dL (ref 70–99)
Potassium: 3.6 mmol/L (ref 3.5–5.1)
Sodium: 141 mmol/L (ref 135–145)

## 2018-12-04 LAB — BPAM RBC
Blood Product Expiration Date: 202009182359
ISSUE DATE / TIME: 202008261220
Unit Type and Rh: 5100

## 2018-12-04 NOTE — Progress Notes (Signed)
  Speech Language Pathology Treatment: Cognitive-Linquistic(Dysarthria )  Patient Details Name: Stacy Cook MRN: [id number] DOB: 1936-02-23 Today's Date: 12/04/2018 Time: 3838-1840 SLP Time Calculation (min) (ACUTE ONLY): 20 min  Assessment / Plan / Recommendation Clinical Impression  Pt was seen for dysarthria treatment and was cooperative during the session. Articulatory precision was improved compared to that which was noted yesterday. She was able to independently recall two of the four compensatory strategies for speech intelligibility which were given yesterday and recalled the additional strategies with min cues. She stated that she has been attempting to use the strategies during conversation and that her son told her this morning that her speech was improved compared to yesterday. She used compensatory strategies at the 5-7 word sentence level with 100% accuracy. At the 8-9 word sentence level she demonstrated 70% accuracy increasing to 100% accuracy with min-mod cues for overarticulation and rate. She continues to require moderate cues during conversation but self-correction was intermittently demonstrated. SLP will continue to follow pt.    HPI HPI: Pt is an 83 y.o. female with past medical history of multiple cancers including that of right lung, stomach, hypertension, hyperlipidemia, severe aortic stenosis status post TAVR on 12/02/18 who started noticing right-sided numbness around 6:30 PM. Code stroke was called and MRI revealed 10 x 12 mm acute infarct left anterior pons and punctate areas of acute infarct in the left frontal lobe.      SLP Plan  Continue with current plan of care       Recommendations                   Follow up Recommendations: (Continued SLP services at level of care recommended by PT/OT) SLP Visit Diagnosis: Dysarthria and anarthria (R47.1) Plan: Continue with current plan of care       Stacy Cook I. Hardin Negus, [place], [place] Office number [phone] Pager [place] 12/04/2018, 10:10 AM

## 2018-12-04 NOTE — Progress Notes (Signed)
2 Days Post-Op Procedure(s) (LRB): TRANSCATHETER AORTIC VALVE REPLACEMENT, TRANSFEMORAL (N/A) TRANSESOPHAGEAL ECHOCARDIOGRAM (TEE) (N/A) Subjective: Slept well. Feels ok other than her neuro deficit. Has been able to move right leg some overnight which is an improvement.  Objective: Vital signs in last 24 hours: Temp:  [97.7 F (36.5 C)-98.8 F (37.1 C)] 98 F (36.7 C) (08/27 0538) Pulse Rate:  [54-67] 57 (08/27 0538) Cardiac Rhythm: Junctional rhythm (08/27 0535) Resp:  [12-18] 15 (08/27 0538) BP: (137-189)/(37-82) 161/43 (08/27 0538) SpO2:  [95 %-100 %] 98 % (08/27 0538) Weight:  [50.8 kg] 50.8 kg (08/27 0538)  Hemodynamic parameters for last 24 hours:    Intake/Output from previous day: 08/26 0701 - 08/27 0700 In: 480 [I.V.:50; Blood:330; IV Piggyback:100] Out: 450 [Urine:450] Intake/Output this shift: No intake/output data recorded.  General appearance: alert and cooperative Neurologic: right hemiparesis. can lift right leg a little today. Only movement in right upper extremity is thumb. Heart: regular rate and rhythm, S1, S2 normal, no murmur, click, rub or gallop Lungs: clear to auscultation bilaterally Extremities: extremities normal, atraumatic, no cyanosis or edema Wound: groin sites ok  Lab Results: Recent Labs    12/03/18 0223 12/03/18 1742 12/04/18 0206  WBC 3.1*  --  3.3*  HGB 8.4* 10.4* 10.1*  HCT 25.6* 31.4* 29.7*  PLT 52*  --  51*   BMET:  Recent Labs    12/03/18 0223 12/04/18 0206  NA 139 141  K 3.9 3.6  CL 111 113*  CO2 21* 19*  GLUCOSE 91 92  BUN 21 18  CREATININE 1.51* 1.45*  CALCIUM 8.4* 8.4*    PT/INR: No results for input(s): LABPROT, INR in the last 72 hours. ABG    Component Value Date/Time   PHART 7.374 11/28/2018 1402   HCO3 22.0 11/28/2018 1402   TCO2 22 12/02/2018 1600   ACIDBASEDEF 2.4 (H) 11/28/2018 1402   O2SAT 98.0 11/28/2018 1402   CBG (last 3)  Recent Labs    12/02/18 1031  GLUCAP 94     Assessment/Plan: S/P Procedure(s) (LRB): TRANSCATHETER AORTIC VALVE REPLACEMENT, TRANSFEMORAL (N/A) TRANSESOPHAGEAL ECHOCARDIOGRAM (TEE) (N/A)  POD 2 Hemodynamically stable in sinus rhythm. She had some junctional brady 50's while sleeping but sinus 80's when awake. MAP 76.  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 Continue observation, PT, OT. Slight improvement in right leg overnight.  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 Hgb improved with transfusion yesterday.   Echo reviewed and TAVR valve functioning normally with mean gradient 6 mm Hg, mild paravalvular leak.  Stage 3-4 CKD: creat stable at baseline.   LOS: 2 days    Gaye Pollack 12/04/2018

## 2018-12-04 NOTE — Evaluation (Addendum)
Physical Therapy Evaluation Patient Details Name: Stacy Cook MRN: [id number] DOB: 08/24/1935 Today's Date: 12/04/2018   History of Present Illness  Pt is an 83 y.o. female with past medical history of multiple cancers including that of right lung, stomach, hypertension, hyperlipidemia, severe aortic stenosis status post TAVR on 12/02/18 who started noticing right-sided numbness around 6:30 PM. Code stroke was called and MRI revealed 10 x 12 mm acute infarct left anterior pons and punctate areas of acute infarct in the left frontal lobe.  Clinical Impression  Pt admitted with above diagnosis. Pt was able to stand pivot with +2 mod assist and HHA. Pt performed 2 stand pivot transfers with this assist.  Pt with weakness on right hemibody hindering pt's ability to safely transition without 2 persons.  Feel that REhab would benefit pt and she states her son can take off initiialy when she is discharged.  Will follow acutely.  Pt currently with functional limitations due to the deficits listed below (see PT Problem List). Pt will benefit from skilled PT to increase their independence and safety with mobility to allow discharge to the venue listed below.      Follow Up Recommendations CIR;Supervision/Assistance - 24 hour    Equipment Recommendations  Other (comment)(TBA)    Recommendations for Other Services Rehab consult     Precautions / Restrictions Precautions Precautions: Fall Restrictions Weight Bearing Restrictions: No      Mobility  Bed Mobility Overal bed mobility: Needs Assistance Bed Mobility: Supine to Sit     Supine to sit: Mod assist;+2 for physical assistance;HOB elevated     General bed mobility comments: Pt pulled up on PT and OT assisted at trunk.  Once up on EOB, able to scoot out with min assist but losing balance to left and posterior at times.   Transfers Overall transfer level: Needs assistance Equipment used: None;1 person hand held assist Transfers: Sit  to/from [facility] Sit to Stand: Mod assist;+2 physical assistance;From elevated surface Stand pivot transfers: Mod assist;+2 physical assistance;From elevated surface       General transfer comment: Pt needed mod assist to power up.  Could not move the right LE to pivot to right therefore needed assist to move Right LE for pivot transfer.  Pt on 3N1 at that point and urinated.  Stood to  bec leaned with same assist then pivoted to her left able to move the left LE easier still needing assist to move the right LE by OT to get to recliner.   Ambulation/Gait                Stairs            Wheelchair Mobility    Modified Rankin (Stroke Patients Only) Modified Rankin (Stroke Patients Only) Pre-Morbid Rankin Score: No symptoms Modified Rankin: Severe disability     Balance Overall balance assessment: Needs assistance Sitting-balance support: Bilateral upper extremity supported;Feet supported Sitting balance-Leahy Scale: Poor Sitting balance - Comments: pt leans left and posterior and needs min assist to sit EOB Postural control: Left lateral lean;Posterior lean Standing balance support: Single extremity supported;During functional activity Standing balance-Leahy Scale: Poor Standing balance comment: Pt with 1UE support for balance with min assist to steady statically and mod dynamically.                              Pertinent Vitals/Pain Pain Assessment: No/denies pain    Home Living Family/patient expects  to be discharged to:: Private residence Living Arrangements: Alone Available Help at Discharge: Family;Available PRN/intermittently Type of Home: House Home Access: Stairs to enter Entrance Stairs-Rails: None Entrance Stairs-Number of Steps: 4 Home Layout: One level Home Equipment: Crutches;Wheelchair - manual;Cane - single point;Walker - 2 wheels;Hand held shower head      Prior Function Level of Independence: Needs assistance    Gait / Transfers Assistance Needed: Fully independent PTA  ADL's / Homemaking Assistance Needed: B/D self  Comments: enjoys scrapbooking, driving     Hand Dominance   Dominant Hand: Right    Extremity/Trunk Assessment   Upper Extremity Assessment Upper Extremity Assessment: Defer to OT evaluation    Lower Extremity Assessment Lower Extremity Assessment: RLE deficits/detail RLE Deficits / Details: hip 2-/5, knee 2+/5, ankle DF 0/5, PF 1/5 RLE Sensation: decreased light touch;decreased proprioception RLE Coordination: decreased gross motor    Cervical / Trunk Assessment Cervical / Trunk Assessment: Normal  Communication   Communication: No difficulties  Cognition Arousal/Alertness: Awake/alert Behavior During Therapy: WFL for tasks assessed/performed Overall Cognitive Status: Within Functional Limits for tasks assessed                                        General Comments      Exercises  LAQ x 5 reps, hip flexion 5 reps   Assessment/Plan    PT Assessment Patient needs continued PT services  PT Problem List Decreased mobility;Decreased balance;Decreased coordination;Decreased strength;Decreased range of motion;Decreased activity tolerance;Decreased knowledge of use of DME;Decreased safety awareness;Decreased knowledge of precautions       PT Treatment Interventions DME instruction;Gait training;Stair training;Functional mobility training;Therapeutic activities;Therapeutic exercise;Balance training;Patient/family education    PT Goals (Current goals can be found in the Care Plan section)  Acute Rehab PT Goals Patient Stated Goal: to go home PT Goal Formulation: With patient Time For Goal Achievement: 12/18/18 Potential to Achieve Goals: Good    Frequency Min 4X/week   Barriers to discharge        Co-evaluation PT/OT/SLP Co-Evaluation/Treatment: Yes Reason for Co-Treatment: Complexity of the patient's impairments (multi-system  involvement);For patient/therapist safety PT goals addressed during session: Mobility/safety with mobility         AM-PAC PT "6 Clicks" Mobility  Outcome Measure Help needed turning from your back to your side while in a flat bed without using bedrails?: A Lot Help needed moving from lying on your back to sitting on the side of a flat bed without using bedrails?: A Lot Help needed moving to and from a bed to a chair (including a wheelchair)?: A Lot Help needed standing up from a chair using your arms (e.g., wheelchair or bedside chair)?: A Lot Help needed to walk in hospital room?: Total Help needed climbing 3-5 steps with a railing? : Total 6 Click Score: 10    End of Session Equipment Utilized During Treatment: Gait belt Activity Tolerance: Patient limited by fatigue Patient left: in chair;with call bell/phone within reach Nurse Communication: Mobility status PT Visit Diagnosis: Unsteadiness on feet (R26.81);Muscle weakness (generalized) (M62.81)    Time: 7510-2585 PT Time Calculation (min) (ACUTE ONLY): 24 min   Charges:   PT Evaluation $PT Eval Moderate Complexity: 1 Mod          Valiant Dills,PT Acute Rehabilitation Services Pager:  [phone]  Office:  [phone]    Denice Paradise 12/04/2018, 11:55 AM

## 2018-12-04 NOTE — Progress Notes (Signed)
Inpatient Rehab Admissions:  Inpatient Rehab Consult received.  I met with pt and her son Stacy Cook at the bedside for rehabilitation assessment and to discuss goals and expectations of an inpatient rehab admission.  Pt appears to be a great candidate for CIR. Pt and family addressing caregiver support tonight with other family members to see who can assist her at DC. DC plan must be confirmed prior to admit. Will begin insurance authorization process for possible admit.  Please call if questions.   Jhonnie Garner, OTR/L  Rehab Admissions Coordinator  [phone] 12/04/2018 3:18 PM

## 2018-12-04 NOTE — Evaluation (Signed)
Occupational Therapy Evaluation Patient Details Name: Stacy Cook MRN: [id number] DOB: 16-Jun-1935 Today's Date: 12/04/2018    History of Present Illness Pt is an 83 y.o. female with past medical history of multiple cancers including that of right lung, stomach, hypertension, hyperlipidemia, severe aortic stenosis status post TAVR on 12/02/18 who started noticing right-sided numbness around 6:30 PM. Code stroke was called and MRI revealed 10 x 12 mm acute infarct left anterior pons and punctate areas of acute infarct in the left frontal lobe.   Clinical Impression   Pt was independent prior to admission. Presents with significant R side weakness with impaired R UE sensation. She transferred x 2 with +2 mod assist and requires min to total assist for ADL. Pt is highly motivated to return to independence. Recommending intensive rehab prior to return home. Will follow acutely.    Follow Up Recommendations  CIR    Equipment Recommendations  3 in 1 bedside commode    Recommendations for Other Services       Precautions / Restrictions Precautions Precautions: Fall Restrictions Weight Bearing Restrictions: No      Mobility Bed Mobility Overal bed mobility: Needs Assistance Bed Mobility: Supine to Sit     Supine to sit: Mod assist;+2 for physical assistance;HOB elevated     General bed mobility comments: Pt pulled up on PT and OT assisted at trunk.  Once up on EOB, able to scoot out with min assist but losing balance to left and posterior at times.   Transfers Overall transfer level: Needs assistance Equipment used: None;1 person hand held assist Transfers: Sit to/from [facility] Sit to Stand: Mod assist;+2 physical assistance;From elevated surface Stand pivot transfers: Mod assist;+2 physical assistance;From elevated surface       General transfer comment: Pt needed mod assist to power up.  Could not move the right LE to pivot to right therefore needed  assist to move Right LE for pivot transfer.  Pt on 3N1 at that point and urinated.  Stood to  bec leaned with same assist then pivoted to her left able to move the left LE easier still needing assist to move the right LE by OT to get to recliner.     Balance Overall balance assessment: Needs assistance Sitting-balance support: Bilateral upper extremity supported;Feet supported Sitting balance-Leahy Scale: Poor Sitting balance - Comments: pt leans left and posterior and needs min assist to sit EOB Postural control: Left lateral lean;Posterior lean Standing balance support: Single extremity supported;During functional activity Standing balance-Leahy Scale: Poor Standing balance comment: Pt with 1UE support for balance with min assist to steady statically and mod dynamically.                            ADL either performed or assessed with clinical judgement   ADL Overall ADL's : Needs assistance/impaired Eating/Feeding: Minimal assistance;Sitting   Grooming: Minimal assistance;Sitting   Upper Body Bathing: Moderate assistance;Sitting   Lower Body Bathing: Total assistance;Sit to/from stand;+2 for physical assistance   Upper Body Dressing : Moderate assistance;Sitting   Lower Body Dressing: Total assistance;Sit to/from stand;+2 for physical assistance   Toilet Transfer: +2 for physical assistance;Moderate assistance;Stand-pivot;BSC   Toileting- Clothing Manipulation and Hygiene: +2 for physical assistance;Total assistance;Sit to/from stand               Vision Baseline Vision/History: Wears glasses Patient Visual Report: No change from baseline       Perception  Praxis      Pertinent Vitals/Pain Pain Assessment: No/denies pain     Hand Dominance Right   Extremity/Trunk Assessment Upper Extremity Assessment Upper Extremity Assessment: RUE deficits/detail RUE Deficits / Details: 2/5 shoulder and elbow, no forearm, wrist movement, moves thumb  slightly RUE Sensation: decreased light touch;decreased proprioception RUE Coordination: decreased fine motor;decreased gross motor   Lower Extremity Assessment Lower Extremity Assessment: Defer to PT evaluation RLE Deficits / Details: hip 2-/5, knee 2+/5, ankle DF 0/5, PF 1/5 RLE Sensation: decreased light touch;decreased proprioception RLE Coordination: decreased gross motor   Cervical / Trunk Assessment Cervical / Trunk Assessment: Other exceptions(weakness)   Communication Communication Communication: No difficulties   Cognition Arousal/Alertness: Awake/alert Behavior During Therapy: WFL for tasks assessed/performed Overall Cognitive Status: Within Functional Limits for tasks assessed                                     General Comments       Exercises     Shoulder Instructions      Home Living Family/patient expects to be discharged to:: Private residence Living Arrangements: Alone Available Help at Discharge: Family;Available PRN/intermittently Type of Home: House Home Access: Stairs to enter [employer] of Steps: 4 Entrance Stairs-Rails: None Home Layout: One level     Bathroom Shower/Tub: Teacher, early years/pre: Standard     Home Equipment: Crutches;Wheelchair - manual;Cane - single point;Walker - 2 wheels;Hand held shower head      Lives With: Alone    Prior Functioning/Environment Level of Independence: Needs assistance  Gait / Transfers Assistance Needed: Fully independent PTA ADL's / Homemaking Assistance Needed: B/D self, drives   Comments: enjoys scrapbooking        OT Problem List: Decreased strength;Decreased activity tolerance;Impaired balance (sitting and/or standing);Decreased coordination;Decreased knowledge of use of DME or AE;Impaired sensation;Impaired UE functional use      OT Treatment/Interventions: Self-care/ADL training;DME and/or AE instruction;Balance training;Patient/family  education;Neuromuscular education;Therapeutic activities    OT Goals(Current goals can be found in the care plan section) Acute Rehab OT Goals Patient Stated Goal: to go home OT Goal Formulation: With patient Time For Goal Achievement: 12/18/18 Potential to Achieve Goals: Good ADL Goals Pt Will Perform Grooming: with modified independence;sitting Pt Will Perform Upper Body Dressing: with set-up;with supervision;sitting Pt Will Perform Lower Body Dressing: with min assist;sit to/from stand;sitting/lateral leans Pt Will Transfer to Toilet: with min assist;stand pivot transfer;bedside commode Pt Will Perform Toileting - Clothing Manipulation and hygiene: with min assist;sitting/lateral leans;sit to/from stand Pt/caregiver will Perform Home Exercise Program: Increased strength;Right Upper extremity;With minimal assist Additional ADL Goal #1: Pt will perform bed mobility with supervision in preparation for ADL. Additional ADL Goal #2: Pt will demonstrate good sitting balance when sitting EOB during ADL.  OT Frequency: Min 3X/week   Barriers to D/C:            Co-evaluation PT/OT/SLP Co-Evaluation/Treatment: Yes Reason for Co-Treatment: Complexity of the patient's impairments (multi-system involvement) PT goals addressed during session: Mobility/safety with mobility OT goals addressed during session: ADL's and self-care      AM-PAC OT "6 Clicks" Daily Activity     Outcome Measure Help from another person eating meals?: A Little Help from another person taking care of personal grooming?: A Little Help from another person toileting, which includes using toliet, bedpan, or urinal?: Total Help from another person bathing (including washing, rinsing, drying)?: A Lot Help from  another person to put on and taking off regular upper body clothing?: A Lot Help from another person to put on and taking off regular lower body clothing?: Total 6 Click Score: 12   End of Session Equipment Utilized  During Treatment: Gait belt  Activity Tolerance: Patient tolerated treatment well Patient left: in chair;with call bell/phone within reach  OT Visit Diagnosis: Unsteadiness on feet (R26.81);Hemiplegia and hemiparesis;Muscle weakness (generalized) (M62.81) Hemiplegia - Right/Left: Right Hemiplegia - dominant/non-dominant: Dominant Hemiplegia - caused by: Cerebral infarction                Time: 1110-1133 OT Time Calculation (min): 23 min Charges:  OT General Charges $OT Visit: 1 Visit OT Evaluation $OT Eval Moderate Complexity: 1 Mod  Nestor Lewandowsky, OTR/L Acute Rehabilitation Services Pager: [phone] Office: [phone]  Malka So 12/04/2018, 12:05 PM

## 2018-12-05 ENCOUNTER — Inpatient Hospital Stay (HOSPITAL_COMMUNITY)
Admission: RE | Admit: 2018-12-05 | Discharge: 2018-12-09 | DRG: 057 | Disposition: A | Discharge status: Other Acute Inpatient Hospital | Payer: Medicare Other | Source: Intra-hospital | Attending: Physical Medicine & Rehabilitation | Admitting: Physical Medicine & Rehabilitation

## 2018-12-05 ENCOUNTER — Other Ambulatory Visit: Payer: Self-pay

## 2018-12-05 ENCOUNTER — Encounter (HOSPITAL_COMMUNITY): Payer: Self-pay

## 2018-12-05 DIAGNOSIS — I639 Cerebral infarction, unspecified: Secondary | ICD-10-CM | POA: Diagnosis present

## 2018-12-05 DIAGNOSIS — I69322 Dysarthria following cerebral infarction: Secondary | ICD-10-CM

## 2018-12-05 DIAGNOSIS — M810 Age-related osteoporosis without current pathological fracture: Secondary | ICD-10-CM | POA: Diagnosis present

## 2018-12-05 DIAGNOSIS — I5032 Chronic diastolic (congestive) heart failure: Secondary | ICD-10-CM

## 2018-12-05 DIAGNOSIS — N179 Acute kidney failure, unspecified: Secondary | ICD-10-CM | POA: Diagnosis present

## 2018-12-05 DIAGNOSIS — I350 Nonrheumatic aortic (valve) stenosis: Secondary | ICD-10-CM

## 2018-12-05 DIAGNOSIS — D631 Anemia in chronic kidney disease: Secondary | ICD-10-CM | POA: Diagnosis present

## 2018-12-05 DIAGNOSIS — D638 Anemia in other chronic diseases classified elsewhere: Secondary | ICD-10-CM

## 2018-12-05 DIAGNOSIS — Z833 Family history of diabetes mellitus: Secondary | ICD-10-CM

## 2018-12-05 DIAGNOSIS — I2510 Atherosclerotic heart disease of native coronary artery without angina pectoris: Secondary | ICD-10-CM | POA: Diagnosis present

## 2018-12-05 DIAGNOSIS — Z952 Presence of prosthetic heart valve: Secondary | ICD-10-CM

## 2018-12-05 DIAGNOSIS — F411 Generalized anxiety disorder: Secondary | ICD-10-CM | POA: Diagnosis present

## 2018-12-05 DIAGNOSIS — Z882 Allergy status to sulfonamides status: Secondary | ICD-10-CM

## 2018-12-05 DIAGNOSIS — N189 Chronic kidney disease, unspecified: Secondary | ICD-10-CM

## 2018-12-05 DIAGNOSIS — Z902 Acquired absence of lung [part of]: Secondary | ICD-10-CM | POA: Diagnosis not present

## 2018-12-05 DIAGNOSIS — R55 Syncope and collapse: Secondary | ICD-10-CM | POA: Diagnosis not present

## 2018-12-05 DIAGNOSIS — Z87442 Personal history of urinary calculi: Secondary | ICD-10-CM

## 2018-12-05 DIAGNOSIS — N183 Chronic kidney disease, stage 3 (moderate): Secondary | ICD-10-CM | POA: Diagnosis present

## 2018-12-05 DIAGNOSIS — I6340 Cerebral infarction due to embolism of unspecified cerebral artery: Secondary | ICD-10-CM | POA: Diagnosis not present

## 2018-12-05 DIAGNOSIS — R569 Unspecified convulsions: Secondary | ICD-10-CM | POA: Diagnosis not present

## 2018-12-05 DIAGNOSIS — Z953 Presence of xenogenic heart valve: Secondary | ICD-10-CM | POA: Diagnosis not present

## 2018-12-05 DIAGNOSIS — C3431 Malignant neoplasm of lower lobe, right bronchus or lung: Secondary | ICD-10-CM | POA: Diagnosis present

## 2018-12-05 DIAGNOSIS — Z950 Presence of cardiac pacemaker: Secondary | ICD-10-CM | POA: Diagnosis not present

## 2018-12-05 DIAGNOSIS — I442 Atrioventricular block, complete: Secondary | ICD-10-CM | POA: Diagnosis not present

## 2018-12-05 DIAGNOSIS — Z954 Presence of other heart-valve replacement: Secondary | ICD-10-CM

## 2018-12-05 DIAGNOSIS — S61511A Laceration without foreign body of right wrist, initial encounter: Secondary | ICD-10-CM | POA: Diagnosis not present

## 2018-12-05 DIAGNOSIS — E1122 Type 2 diabetes mellitus with diabetic chronic kidney disease: Secondary | ICD-10-CM | POA: Diagnosis present

## 2018-12-05 DIAGNOSIS — Z90710 Acquired absence of both cervix and uterus: Secondary | ICD-10-CM

## 2018-12-05 DIAGNOSIS — I440 Atrioventricular block, first degree: Secondary | ICD-10-CM | POA: Diagnosis not present

## 2018-12-05 DIAGNOSIS — Z955 Presence of coronary angioplasty implant and graft: Secondary | ICD-10-CM

## 2018-12-05 DIAGNOSIS — I469 Cardiac arrest, cause unspecified: Secondary | ICD-10-CM | POA: Diagnosis not present

## 2018-12-05 DIAGNOSIS — I63439 Cerebral infarction due to embolism of unspecified posterior cerebral artery: Secondary | ICD-10-CM | POA: Diagnosis not present

## 2018-12-05 DIAGNOSIS — Z85828 Personal history of other malignant neoplasm of skin: Secondary | ICD-10-CM

## 2018-12-05 DIAGNOSIS — I69351 Hemiplegia and hemiparesis following cerebral infarction affecting right dominant side: Principal | ICD-10-CM

## 2018-12-05 DIAGNOSIS — Z887 Allergy status to serum and vaccine status: Secondary | ICD-10-CM

## 2018-12-05 DIAGNOSIS — E875 Hyperkalemia: Secondary | ICD-10-CM | POA: Diagnosis present

## 2018-12-05 DIAGNOSIS — K5901 Slow transit constipation: Secondary | ICD-10-CM

## 2018-12-05 DIAGNOSIS — I6350 Cerebral infarction due to unspecified occlusion or stenosis of unspecified cerebral artery: Secondary | ICD-10-CM

## 2018-12-05 DIAGNOSIS — Z903 Acquired absence of stomach [part of]: Secondary | ICD-10-CM

## 2018-12-05 DIAGNOSIS — D696 Thrombocytopenia, unspecified: Secondary | ICD-10-CM | POA: Diagnosis not present

## 2018-12-05 DIAGNOSIS — Z888 Allergy status to other drugs, medicaments and biological substances status: Secondary | ICD-10-CM

## 2018-12-05 DIAGNOSIS — I459 Conduction disorder, unspecified: Secondary | ICD-10-CM

## 2018-12-05 DIAGNOSIS — I447 Left bundle-branch block, unspecified: Secondary | ICD-10-CM | POA: Diagnosis not present

## 2018-12-05 DIAGNOSIS — I130 Hypertensive heart and chronic kidney disease with heart failure and stage 1 through stage 4 chronic kidney disease, or unspecified chronic kidney disease: Secondary | ICD-10-CM | POA: Diagnosis present

## 2018-12-05 DIAGNOSIS — E876 Hypokalemia: Secondary | ICD-10-CM | POA: Diagnosis not present

## 2018-12-05 DIAGNOSIS — K5900 Constipation, unspecified: Secondary | ICD-10-CM | POA: Diagnosis present

## 2018-12-05 DIAGNOSIS — Z7902 Long term (current) use of antithrombotics/antiplatelets: Secondary | ICD-10-CM

## 2018-12-05 DIAGNOSIS — Z79899 Other long term (current) drug therapy: Secondary | ICD-10-CM

## 2018-12-05 DIAGNOSIS — E785 Hyperlipidemia, unspecified: Secondary | ICD-10-CM | POA: Diagnosis present

## 2018-12-05 DIAGNOSIS — K219 Gastro-esophageal reflux disease without esophagitis: Secondary | ICD-10-CM | POA: Diagnosis present

## 2018-12-05 DIAGNOSIS — I749 Embolism and thrombosis of unspecified artery: Secondary | ICD-10-CM | POA: Diagnosis not present

## 2018-12-05 DIAGNOSIS — G40901 Epilepsy, unspecified, not intractable, with status epilepticus: Secondary | ICD-10-CM | POA: Diagnosis not present

## 2018-12-05 DIAGNOSIS — Z7982 Long term (current) use of aspirin: Secondary | ICD-10-CM

## 2018-12-05 DIAGNOSIS — Z006 Encounter for examination for normal comparison and control in clinical research program: Secondary | ICD-10-CM | POA: Diagnosis not present

## 2018-12-05 HISTORY — DX: Cerebral infarction, unspecified: I63.9

## 2018-12-05 LAB — BASIC METABOLIC PANEL
Anion gap: 7 (ref 5–15)
BUN: 17 mg/dL (ref 8–23)
CO2: 24 mmol/L (ref 22–32)
Calcium: 8.3 mg/dL — ABNORMAL LOW (ref 8.9–10.3)
Chloride: 111 mmol/L (ref 98–111)
Creatinine, Ser: 1.4 mg/dL — ABNORMAL HIGH (ref 0.44–1.00)
GFR calc Af Amer: 41 mL/min — ABNORMAL LOW (ref >60)
GFR calc non Af Amer: 35 mL/min — ABNORMAL LOW (ref >60)
Glucose, Bld: 101 mg/dL — ABNORMAL HIGH (ref 70–99)
Potassium: 3.8 mmol/L (ref 3.5–5.1)
Sodium: 142 mmol/L (ref 135–145)

## 2018-12-05 LAB — CBC
HCT: 30.1 % — ABNORMAL LOW (ref 36.0–46.0)
Hemoglobin: 10.2 g/dL — ABNORMAL LOW (ref 12.0–15.0)
MCH: 30.2 pg (ref 26.0–34.0)
MCHC: 33.9 g/dL (ref 30.0–36.0)
MCV: 89.1 fL (ref 80.0–100.0)
Platelets: 44 10*3/uL — ABNORMAL LOW (ref 150–400)
RBC: 3.38 MIL/uL — ABNORMAL LOW (ref 3.87–5.11)
RDW: 12.7 % (ref 11.5–15.5)
WBC: 3.2 10*3/uL — ABNORMAL LOW (ref 4.0–10.5)
nRBC: 0 % (ref 0.0–0.2)

## 2018-12-05 MED ORDER — CLOPIDOGREL BISULFATE 75 MG PO TABS
75.0000 mg | ORAL_TABLET | Freq: Every day | ORAL | Status: DC
  Administered 2018-12-06 – 2018-12-09 (×4): 75 mg via ORAL
  Filled 2018-12-05 (×4): qty 1

## 2018-12-05 MED ORDER — TRAMADOL HCL 50 MG PO TABS
50.0000 mg | ORAL_TABLET | ORAL | Status: DC | PRN
  Filled 2018-12-05: qty 1

## 2018-12-05 MED ORDER — DIPHENHYDRAMINE HCL 12.5 MG/5ML PO ELIX: 12.5000 mg | ORAL_SOLUTION | Freq: Four times a day (QID) | ORAL | Status: DC | PRN

## 2018-12-05 MED ORDER — ACETAMINOPHEN 325 MG PO TABS
325.0000 mg | ORAL_TABLET | ORAL | Status: DC | PRN
  Administered 2018-12-06: 650 mg via ORAL
  Filled 2018-12-05: qty 2

## 2018-12-05 MED ORDER — CHLORHEXIDINE GLUCONATE CLOTH 2 % EX PADS: 6.0000 | MEDICATED_PAD | Freq: Every day | CUTANEOUS | Status: AC

## 2018-12-05 MED ORDER — PROCHLORPERAZINE 25 MG RE SUPP: 12.5000 mg | Freq: Four times a day (QID) | RECTAL | Status: DC | PRN

## 2018-12-05 MED ORDER — TRAZODONE HCL 50 MG PO TABS: 25.0000 mg | ORAL_TABLET | Freq: Every evening | ORAL | Status: DC | PRN

## 2018-12-05 MED ORDER — PROCHLORPERAZINE EDISYLATE 10 MG/2ML IJ SOLN: 5.0000 mg | Freq: Four times a day (QID) | INTRAMUSCULAR | Status: DC | PRN

## 2018-12-05 MED ORDER — PROCHLORPERAZINE MALEATE 5 MG PO TABS
5.0000 mg | ORAL_TABLET | Freq: Four times a day (QID) | ORAL | Status: DC | PRN
  Administered 2018-12-06: 10 mg via ORAL
  Filled 2018-12-05: qty 2

## 2018-12-05 MED ORDER — ALUM & MAG HYDROXIDE-SIMETH 200-200-20 MG/5ML PO SUSP: 30.0000 mL | ORAL | Status: DC | PRN

## 2018-12-05 MED ORDER — MUPIROCIN 2 % EX OINT
1.0000 "application " | TOPICAL_OINTMENT | Freq: Two times a day (BID) | CUTANEOUS | Status: AC
  Administered 2018-12-06 – 2018-12-07 (×3): 1 via NASAL
  Filled 2018-12-05: qty 22

## 2018-12-05 MED ORDER — OXYCODONE HCL 5 MG PO TABS: 5.0000 mg | ORAL_TABLET | ORAL | Status: DC | PRN

## 2018-12-05 MED ORDER — FLEET ENEMA 7-19 GM/118ML RE ENEM: 1.0000 | ENEMA | Freq: Once | RECTAL | Status: DC | PRN

## 2018-12-05 MED ORDER — FERROUS SULFATE 325 (65 FE) MG PO TABS
325.0000 mg | ORAL_TABLET | Freq: Every day | ORAL | Status: DC
  Administered 2018-12-06 – 2018-12-09 (×4): 325 mg via ORAL
  Filled 2018-12-05 (×4): qty 1

## 2018-12-05 MED ORDER — ASPIRIN EC 81 MG PO TBEC
81.0000 mg | DELAYED_RELEASE_TABLET | Freq: Every day | ORAL | Status: DC
  Administered 2018-12-06 – 2018-12-09 (×4): 81 mg via ORAL
  Filled 2018-12-05 (×4): qty 1

## 2018-12-05 MED ORDER — OSIMERTINIB MESYLATE 80 MG PO TABS
80.0000 mg | ORAL_TABLET | Freq: Every evening | ORAL | Status: DC
  Administered 2018-12-05 – 2018-12-08 (×4): 80 mg via ORAL
  Filled 2018-12-05 (×5): qty 1

## 2018-12-05 MED ORDER — BISACODYL 10 MG RE SUPP
10.0000 mg | Freq: Every day | RECTAL | Status: DC | PRN
  Administered 2018-12-06: 10 mg via RECTAL
  Filled 2018-12-05: qty 1

## 2018-12-05 MED ORDER — POLYETHYLENE GLYCOL 3350 17 G PO PACK
17.0000 g | PACK | Freq: Every day | ORAL | Status: DC | PRN
  Administered 2018-12-05 – 2018-12-06 (×2): 17 g via ORAL
  Filled 2018-12-05 (×2): qty 1

## 2018-12-05 MED ORDER — GUAIFENESIN-DM 100-10 MG/5ML PO SYRP: 5.0000 mL | ORAL_SOLUTION | Freq: Four times a day (QID) | ORAL | Status: DC | PRN

## 2018-12-05 MED ORDER — PANTOPRAZOLE SODIUM 40 MG PO TBEC
40.0000 mg | DELAYED_RELEASE_TABLET | Freq: Every day | ORAL | Status: DC
  Administered 2018-12-06 – 2018-12-09 (×4): 40 mg via ORAL
  Filled 2018-12-05 (×4): qty 1

## 2018-12-05 MED FILL — Heparin Sodium (Porcine) Inj 1000 Unit/ML: INTRAMUSCULAR | Qty: 30 | Status: AC

## 2018-12-05 MED FILL — Potassium Chloride Inj 2 mEq/ML: INTRAVENOUS | Qty: 40 | Status: AC

## 2018-12-05 MED FILL — Magnesium Sulfate Inj 50%: INTRAMUSCULAR | Qty: 10 | Status: AC
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
## 2018-12-05 NOTE — Plan of Care (Signed)
  Problem: Education: Goal: Knowledge of disease or condition will improve Outcome: Progressing   Problem: Coping: Goal: Will verbalize positive feelings about self Outcome: Progressing Goal: Will identify appropriate support needs Outcome: Progressing   Problem: Health Behavior/Discharge Planning: Goal: Ability to manage health-related needs will improve Outcome: Progressing   Problem: Self-Care: Goal: Ability to participate in self-care as condition permits will improve Outcome: Progressing

## 2018-12-05 NOTE — Progress Notes (Signed)
  [facility]   MULTIDISCIPLINARY HEART VALVE TEAM  Discussed case with, Jhonnie Garner, from inpatient rehab. She believes there is a good chance that Miss Cobb could get insurance approval to be admitted to inpatient rehab today. I will prep a discharge summary in case. If we do not get approval today we will have to keep her inpatient over the weekend with plans for discharge on Monday.   Angelena Form PA-C  [facility]

## 2018-12-05 NOTE — Progress Notes (Signed)
Inpatient Rehabilitation-Admissions Coordinator   [facility] has received insurance approval and medical clearance from attending service for admit to CIR today. AC has confirmed support and pt would like to proceed with CIR. Emergency planning/management officer given and consent forms signed.  AC has notified RN and CM regarding plan.   Please call if questions.   Jhonnie Garner, OTR/L  Rehab Admissions Coordinator  [phone] 12/05/2018 1:52 PM
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
## 2018-12-05 NOTE — Progress Notes (Signed)
Patient arrived to unit from 4E. Patient admitted to room [id number], accompanied by staff and son. Patient alert, and stable, patient  oriented to the rehab unit, medications, and therapy schedule. Patient resting comfortably in bed with call light within reach, no questions/concerns noted at this time.

## 2018-12-05 NOTE — Progress Notes (Signed)
Physical Therapy Treatment Patient Details Name: Stacy Cook MRN: [id number] DOB: 24-May-1935 Today's Date: 12/05/2018    History of Present Illness Pt is an 83 y.o. female with past medical history of multiple cancers including that of right lung, stomach, hypertension, hyperlipidemia, severe aortic stenosis status post TAVR on 12/02/18 who started noticing right-sided numbness around 6:30 PM. Code stroke was called and MRI revealed 10 x 12 mm acute infarct left anterior pons and punctate areas of acute infarct in the left frontal lobe.    PT Comments    Pt in chair on arrival and very eager and motivated to return to independent function. Pt with excellent progress this session able to ambulate short distance with assist and able to stand without physical assist. Pt educated for pivots and encouraged to perform getting up to [employer] as well as OOB daily with nursing staff. Pt educated and performed HEP. Excellent CIR candidate.   Pt with RLE strength: hip Abduction 2-/5, hip ADD 2/5, hip flexion 3/5, quad 3/5, dorsiflexion 0/5  99% on RA throughout   Follow Up Recommendations  CIR;Supervision/Assistance - 24 hour     Equipment Recommendations  Other (comment)(TBD)    Recommendations for Other Services       Precautions / Restrictions Precautions Precautions: Fall Precaution Comments: right hemiparesis    Mobility  Bed Mobility               General bed mobility comments: in recliner on arrival  Transfers Overall transfer level: Needs assistance   Transfers: Sit to/from Stand;Stand Pivot Transfers Sit to Stand: Min guard Stand pivot transfers: Min assist       General transfer comment: initial stand from chair with RLE blocked and min assist. Repeated x 6 trials with minguard and pt able to stand from chair and BSC without physical assist. Pivot chair to [facility] to left with minguard assist and back to right with min assist for balance and  stability  Ambulation/Gait Ambulation/Gait assistance: Min assist;+2 safety/equipment Gait Distance (Feet): 25 Feet Assistive device: 2 person hand held assist Gait Pattern/deviations: Step-to pattern;Narrow base of support   Gait velocity interpretation: 1.31 - 2.62 ft/sec, indicative of limited community ambulator General Gait Details: cues for looking up, increased BOS, sequence and stride. pt with support of therapist and tech on either side with RUE supported throughout. 2 instances of partial buckling on right with assist to guard knee but pt able to correct into extension with pause and cueing   Stairs             Wheelchair Mobility    Modified Rankin (Stroke Patients Only) Modified Rankin (Stroke Patients Only) Pre-Morbid Rankin Score: No symptoms Modified Rankin: Moderately severe disability     Balance Overall balance assessment: Needs assistance Sitting-balance support: No upper extremity supported;Feet supported Sitting balance-Leahy Scale: Good       Standing balance-Leahy Scale: Poor Standing balance comment: single UE support to stand                            Cognition Arousal/Alertness: Awake/alert Behavior During Therapy: WFL for tasks assessed/performed Overall Cognitive Status: Within Functional Limits for tasks assessed                                        Exercises General Exercises - Lower Extremity Long Arc Quad: AROM;Right;Seated;10 reps  Heel Slides: AAROM;Right;Seated;10 reps Hip ABduction/ADduction: AAROM;Right;Seated;10 reps Hip Flexion/Marching: AROM;Right;Seated;10 reps    General Comments        Pertinent Vitals/Pain Pain Assessment: No/denies pain    Home Living                      Prior Function            PT Goals (current goals can now be found in the care plan section) Progress towards PT goals: Progressing toward goals    Frequency    Min 4X/week      PT Plan  Current plan remains appropriate    Co-evaluation              AM-PAC PT "6 Clicks" Mobility   Outcome Measure  Help needed turning from your back to your side while in a flat bed without using bedrails?: A Little Help needed moving from lying on your back to sitting on the side of a flat bed without using bedrails?: A Little Help needed moving to and from a bed to a chair (including a wheelchair)?: A Little Help needed standing up from a chair using your arms (e.g., wheelchair or bedside chair)?: A Little Help needed to walk in hospital room?: A Lot Help needed climbing 3-5 steps with a railing? : A Lot 6 Click Score: 16    End of Session Equipment Utilized During Treatment: Gait belt Activity Tolerance: Patient tolerated treatment well Patient left: in chair;with call bell/phone within reach Nurse Communication: Mobility status PT Visit Diagnosis: Unsteadiness on feet (R26.81);Muscle weakness (generalized) (M62.81)     Time: 1610-9604 PT Time Calculation (min) (ACUTE ONLY): 29 min  Charges:  $Gait Training: 8-22 mins $Therapeutic Activity: 8-22 mins                     Amonda Brillhart Pam Drown, PT Acute Rehabilitation Services Pager: [phone] Office: [place] 12/05/2018, 12:36 PM
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
## 2018-12-05 NOTE — Care Management Important Message (Signed)
Important Message  Patient Details  Name: Stacy Cook MRN: [id number] Date of Birth: 1935-04-22   Medicare Important Message Given:  Yes     [name] 12/05/2018, 1:08 PM

## 2018-12-05 NOTE — Discharge Summary (Addendum)
[facility]VE TEAM  Discharge Summary    Patient ID: Stacy Cook MRN: [id number]; DOB: 12-25-1935  Admit date: 12/02/2018 Discharge date: 12/05/2018  Primary Care Provider: Adin Hector, MD  Primary Cardiologist: Dr. Clayborn Bigness / Dr. Angelena Form & Dr. Cyndia Bent (TAVR)  Discharge Diagnoses    Principal Problem:   S/P TAVR (transcatheter aortic valve replacement) Active Problems:   CAD S/P percutaneous coronary angioplasty   HLD (hyperlipidemia)   Thrombocytopenia (HCC)   Type 2 diabetes mellitus ([place])   Primary malignant neoplasm of right lower lobe of lung (HCC)   Carotid stenosis   Adenocarcinoma of gastric cardia (HCC)   Hypertension   Anemia   GERD (gastroesophageal reflux disease)   Acute on chronic diastolic heart failure (HCC)   Allergies Allergies  Allergen Reactions   Mirtazapine     Sluggish    Pneumococcal Vaccine Itching and Other (See Comments)    Hives and fever   Statins Other (See Comments)    Joint pain   Sulfa Antibiotics Nausea And Vomiting    Diagnostic Studies/Procedures    TAVR OPERATIVE NOTE   Date of Procedure:                12/02/2018  Preoperative Diagnosis:      Severe Aortic Stenosis   Postoperative Diagnosis:    Same   Procedure:        Transcatheter Aortic Valve Replacement - Percutaneous Right Transfemoral Approach             Medtronic Evolut Pro + (size 23 mm,  serial # [id number])              Co-Surgeons:                        Gaye Pollack, MD and Lauree Chandler, MD   Anesthesiologist:                  Perfecto Kingdom, MD  Echocardiographer:              Ena Dawley, MD  Pre-operative Echo Findings: ? Severe aortic stenosis ? Normal left ventricular systolic function  Post-operative Echo Findings: ? Trivial paravalvular leak ? Normal left ventricular systolic function   _____________    Echo 12/03/18 IMPRESSIONS  1. - TAVR: A 23 mm  Medtronic Evolut Pro prosthetic valve is present in the aortic position. There is a mild degree of likely paravalvular leak detected by color doppler in the 5 to 7 o'clock position. The V max is 1.7 m/s. The peak/mean gradients are  12/6 mmHG.  2. The left ventricle has normal systolic function with an ejection fraction of 60-65%. The cavity size was normal. There is mildly increased left ventricular wall thickness. indeterminate diastolic dysfunction due to moderate MAC. No evidence of left  ventricular regional wall motion abnormalities.  3. The right ventricle has normal systolic function. The cavity was normal. There is no increase in right ventricular wall thickness.  4. Left atrial size was mildly dilated.  5. Right atrial size was mildly dilated.  6. The mitral valve is degenerative. Moderate thickening of the mitral valve leaflet. Moderate calcification of the mitral valve leaflet. There is moderate mitral annular calcification present. No evidence of mitral valve stenosis.  7. The tricuspid valve is grossly normal.  8. The aorta is normal unless otherwise noted.  9. The aortic root is normal in size and structure. 10.  When compared to the prior study: S/p TAVR. Normal functioning valve.   History of Present Illness     Stacy Cook is a 83 y.o. female with a history of gastric cancer s/p partial gastrectomy in 2018, lung cancer s/p right lower lobectomy in 2017 with recurrence and malignant pleural effusion, pancytopenia, CKD stage III, carotid artery disease, diabetes mellitus, HLD (statin intolerant), HTN, multivessel CAD s/p PCI and severe aortic stenosiswho presented to [facility]18 for planned TAVR.   Echocardiogram August 2018 showed normal LV systolic function, OLMB=86%, moderate LVH and severe aortic stenosis with mean gradient 43 mmHg. Cardiac cath June 2019 at [facility] per Dr. Clayborn Bigness with severe three vessel CAD.   She was referred to Dr. Angelena Form in 09/2018 for  consultation of multivessel PCI and TAVR. She complained of DOE and chest pressure as well as near syncope. Repeat echo 10/11/17 showed EF 60-65%, moderate AS with mild AR; mean gradient 31 mmHg, peak gradient 52 mm Hg, DVI 0.34, AVA 0.56 cm2.   She underwent cath on 10/15/17 with PCI/DES to the distal RCA, and orbital atherectomy/DES to the mLCx. She was placed on DAPT with ASA/plavix. Cath showed a mean gradient of 23mHg and felt to be moderately severe. She had an improvement in chest pain after TAVR. Plans were made to continue following her aortic valve over time.   Repeat Echo 01/20/18 with LVEF=65-70%, severe AS with mean gradient of 54 mmHg, peak gradient 91 mmHg, moderate AI. AVA 0.66 cm2 and plans were made for resumption of TAVR work up. Pre TAVR CTs revealed a new pleural effusion suspicious fo recurrent malignancy. Cytology confirmed the presence of malignant cells and recurrence of her lung cancer. She was referred to Dr. BBurlene Arntwith oncology and her TAVR was delayed.  She was treated with oral chemotherapy and has had two thoracenteses. She has done well with treatment but continued to have significant DOE and fatigue felt to be related to her severe AS. Her most recent CT scan of the chest from 10/29/2018 showed a stable partially loculated right pleural effusion. She was re-evaluated by the multidisciplinary valve team and felt to be a suitable candidate for TAVR, which was scheduled for 12/02/18.   Hospital Course     Consultants: neurology, inpatient rehab  Severe AS: s/p successful TAVR with a Medtronic Evolut Pro + THV via the TF approach on 12/02/18. Post operative echo showed EF 60-65%, normally functioning TAVR with mean gradient of 6 mm Hg and mild PVL. Groin sites are stable. ECG with sinus brady and new LBBB (QRS 138 ms) but no high grade heart block. Continue ASA and plavix.   Peri procedural CVA: unfortunately, she developed right sided weakness and numbness on the evening  after TAVR. Code stroke was called. CTA head and neck showed diffuse atherosclerosis. MRI showed left pontine and frontal embolic stroke. She continues to have right hemiparesis. Continue OT/PT. She will be discharged today to inpatient rehab.  HTN: BP has been elevated but allowing for permissive HTN given CVA. Will resume home imdur 346mdaily and Toprol XL 5057maily but hold Losartan 100m51mily. This can be added back at a later time.  Acute on chronic diastolic CHF: as evidenced by an elevated BNP on pre admission lab work. This has been treated with TAVR. She is not on any diuretic as an outpatient and she appears euvolemic.  Recurrent lung cancer: continue to follow up with Dr. BrahBurlene Arntncytopenia: related to chemo. This has been  stable.   CKD stage III: creat has remained stable ~1.4 _____________  Discharge Vitals Blood pressure (!) 161/34, pulse 67, temperature (!) 97.5 F (36.4 C), temperature source Oral, resp. rate 11, height _0  (1.549 m), weight 50.3 kg, SpO2 99 %.  Filed Weights   12/03/18 0315 12/04/18 0538 12/05/18 0619  Weight: 51.5 kg 50.8 kg 50.3 kg    Labs & Radiologic Studies    CBC Recent Labs    12/04/18 0206 12/05/18 0334  WBC 3.3* 3.2*  HGB 10.1* 10.2*  HCT 29.7* 30.1*  MCV 89.2 89.1  PLT 51* 44*   Basic Metabolic Panel Recent Labs    12/03/18 0223 12/04/18 0206 12/05/18 0334  NA 139 141 142  K 3.9 3.6 3.8  CL 111 113* 111  CO2 21* 19* 24  GLUCOSE 91 92 101*  BUN _1 CREATININE 1.51* 1.45* 1.40*  CALCIUM 8.4* 8.4* 8.3*  MG 1.7  --   --    Liver Function Tests No results for input(s): AST, ALT, ALKPHOS, BILITOT, PROT, ALBUMIN in the last 72 hours. No results for input(s): LIPASE, AMYLASE in the last 72 hours. Cardiac Enzymes No results for input(s): CKTOTAL, CKMB, CKMBINDEX, TROPONINI in the last 72 hours. BNP Invalid input(s): POCBNP D-Dimer No results for input(s): DDIMER in the last 72 hours. Hemoglobin A1C No  results for input(s): HGBA1C in the last 72 hours. Fasting Lipid Panel Recent Labs    12/03/18 0223  CHOL 122  HDL 41  LDLCALC 69  TRIG 58  CHOLHDL 3.0   Thyroid Function Tests No results for input(s): TSH, T4TOTAL, T3FREE, THYROIDAB in the last 72 hours.  Invalid input(s): FREET3 _____________  Ct Angio Head W Or Wo Contrast  Result Date: 12/02/2018 CLINICAL DATA:  Right-sided weakness and slurred speech EXAM: CT ANGIOGRAPHY HEAD AND NECK CT PERFUSION BRAIN TECHNIQUE: Multidetector CT imaging of the head and neck was performed using the standard protocol during bolus administration of intravenous contrast. Multiplanar CT image reconstructions and MIPs were obtained to evaluate the vascular anatomy. Carotid stenosis measurements (when applicable) are obtained utilizing NASCET criteria, using the distal internal carotid diameter as the denominator. Multiphase CT imaging of the brain was performed following IV bolus contrast injection. Subsequent parametric perfusion maps were calculated using RAPID software. CONTRAST:  62m OMNIPAQUE IOHEXOL 350 MG/ML SOLN COMPARISON:  Head CT same day FINDINGS: CTA NECK FINDINGS SKELETON: There is no bony spinal canal stenosis. No lytic or blastic lesion. OTHER NECK: Normal pharynx, larynx and major salivary glands. No cervical lymphadenopathy. Unremarkable thyroid gland. UPPER CHEST: Incompletely visualized right pleural effusion. AORTIC ARCH: There is mild calcific atherosclerosis of the aortic arch. There is no aneurysm, dissection or hemodynamically significant stenosis of the visualized ascending aorta and aortic arch. Conventional 3 vessel aortic branching pattern. The visualized proximal subclavian arteries are widely patent. RIGHT CAROTID SYSTEM: --Common carotid artery: Widely patent origin without common carotid artery dissection or aneurysm. --Internal carotid artery: No dissection, occlusion or aneurysm. Mild atherosclerotic calcification at the carotid  bifurcation without hemodynamically significant stenosis. --External carotid artery: No acute abnormality. LEFT CAROTID SYSTEM: --Common carotid artery: Widely patent origin without common carotid artery dissection or aneurysm. --Internal carotid artery: No dissection, occlusion or aneurysm. There is mixed density atherosclerosis extending into the proximal ICA, resulting in less than 50% stenosis. --External carotid artery: No acute abnormality. VERTEBRAL ARTERIES: Left dominant configuration. Both origins are clearly patent. No dissection, occlusion or flow-limiting stenosis to the skull base (V1-V3 segments). CTA  HEAD FINDINGS POSTERIOR CIRCULATION: --Vertebral arteries: Calcification both V4 segments with mild-to-moderate narrowing bilaterally. --Posterior inferior cerebellar arteries (PICA): The left PICA and AICA share a common origin. Normal origin of right PICA from the ipsilateral vertebral artery. --Anterior inferior cerebellar arteries (AICA): Patent origins from the basilar artery. --Basilar artery: Normal. --Superior cerebellar arteries: Normal. --Posterior cerebral arteries (PCA): Normal. The left PCA is partially supplied by a posterior communicating artery (p-comm). ANTERIOR CIRCULATION: --Intracranial internal carotid arteries: Atherosclerotic calcification of the internal carotid arteries at the skull base without hemodynamically significant stenosis. --Anterior cerebral arteries (ACA): Normal. Both A1 segments are present. Patent anterior communicating artery (a-comm). --Middle cerebral arteries (MCA): Normal. VENOUS SINUSES: As permitted by contrast timing, patent. ANATOMIC VARIANTS: None Review of the MIP images confirms the above findings. CT Brain Perfusion Findings: ASPECTS: 10 CBF (<30%) Volume: 56m Perfusion (Tmax>6.0s) volume: 011mMismatch Volume: 82m58mnfarction Location:None IMPRESSION: 1. Normal CT perfusion scan of the brain. 2. No emergent large vessel occlusion or hemodynamically  significant stenosis. 3. Bilateral carotid bifurcation atherosclerosis and atherosclerosis of the internal carotid arteries and vertebral arteries at the skull base without high-grade stenosis. 4. Incompletely visualized right pleural effusion. 5.  Aortic atherosclerosis (ICD10-I70.0). Electronically Signed   By: KevUlyses JarredD.   On: 12/02/2018 22:42   Dg Chest 2 View  Result Date: 11/28/2018 CLINICAL DATA:  Severe aortic stenosis.  Pre-admission evaluation. EXAM: CHEST - 2 VIEW COMPARISON:  09/12/2018 chest radiograph. FINDINGS: Stable cardiomediastinal silhouette with top-normal heart size. No pneumothorax. Small right pleural effusion. No left pleural effusion. No pulmonary edema. Mild right basilar atelectasis. IMPRESSION: Small right pleural effusion with mild right basilar atelectasis. Electronically Signed   By: JasIlona SorrelD.   On: 11/28/2018 17:28   Ct Angio Neck W Or Wo Contrast  Result Date: 12/02/2018 CLINICAL DATA:  Right-sided weakness and slurred speech EXAM: CT ANGIOGRAPHY HEAD AND NECK CT PERFUSION BRAIN TECHNIQUE: Multidetector CT imaging of the head and neck was performed using the standard protocol during bolus administration of intravenous contrast. Multiplanar CT image reconstructions and MIPs were obtained to evaluate the vascular anatomy. Carotid stenosis measurements (when applicable) are obtained utilizing NASCET criteria, using the distal internal carotid diameter as the denominator. Multiphase CT imaging of the brain was performed following IV bolus contrast injection. Subsequent parametric perfusion maps were calculated using RAPID software. CONTRAST:  682m682mNIPAQUE IOHEXOL 350 MG/ML SOLN COMPARISON:  Head CT same day FINDINGS: CTA NECK FINDINGS SKELETON: There is no bony spinal canal stenosis. No lytic or blastic lesion. OTHER NECK: Normal pharynx, larynx and major salivary glands. No cervical lymphadenopathy. Unremarkable thyroid gland. UPPER CHEST: Incompletely  visualized right pleural effusion. AORTIC ARCH: There is mild calcific atherosclerosis of the aortic arch. There is no aneurysm, dissection or hemodynamically significant stenosis of the visualized ascending aorta and aortic arch. Conventional 3 vessel aortic branching pattern. The visualized proximal subclavian arteries are widely patent. RIGHT CAROTID SYSTEM: --Common carotid artery: Widely patent origin without common carotid artery dissection or aneurysm. --Internal carotid artery: No dissection, occlusion or aneurysm. Mild atherosclerotic calcification at the carotid bifurcation without hemodynamically significant stenosis. --External carotid artery: No acute abnormality. LEFT CAROTID SYSTEM: --Common carotid artery: Widely patent origin without common carotid artery dissection or aneurysm. --Internal carotid artery: No dissection, occlusion or aneurysm. There is mixed density atherosclerosis extending into the proximal ICA, resulting in less than 50% stenosis. --External carotid artery: No acute abnormality. VERTEBRAL ARTERIES: Left dominant configuration. Both origins are clearly patent. No dissection, occlusion or  flow-limiting stenosis to the skull base (V1-V3 segments). CTA HEAD FINDINGS POSTERIOR CIRCULATION: --Vertebral arteries: Calcification both V4 segments with mild-to-moderate narrowing bilaterally. --Posterior inferior cerebellar arteries (PICA): The left PICA and AICA share a common origin. Normal origin of right PICA from the ipsilateral vertebral artery. --Anterior inferior cerebellar arteries (AICA): Patent origins from the basilar artery. --Basilar artery: Normal. --Superior cerebellar arteries: Normal. --Posterior cerebral arteries (PCA): Normal. The left PCA is partially supplied by a posterior communicating artery (p-comm). ANTERIOR CIRCULATION: --Intracranial internal carotid arteries: Atherosclerotic calcification of the internal carotid arteries at the skull base without hemodynamically  significant stenosis. --Anterior cerebral arteries (ACA): Normal. Both A1 segments are present. Patent anterior communicating artery (a-comm). --Middle cerebral arteries (MCA): Normal. VENOUS SINUSES: As permitted by contrast timing, patent. ANATOMIC VARIANTS: None Review of the MIP images confirms the above findings. CT Brain Perfusion Findings: ASPECTS: 10 CBF (<30%) Volume: 74m Perfusion (Tmax>6.0s) volume: 017mMismatch Volume: 32m93mnfarction Location:None IMPRESSION: 1. Normal CT perfusion scan of the brain. 2. No emergent large vessel occlusion or hemodynamically significant stenosis. 3. Bilateral carotid bifurcation atherosclerosis and atherosclerosis of the internal carotid arteries and vertebral arteries at the skull base without high-grade stenosis. 4. Incompletely visualized right pleural effusion. 5.  Aortic atherosclerosis (ICD10-I70.0). Electronically Signed   By: KevUlyses JarredD.   On: 12/02/2018 22:42   Mr Angio Head Wo Contrast  Result Date: 12/03/2018 CLINICAL DATA:  Stroke.  Right-sided numbness EXAM: MRI HEAD WITHOUT CONTRAST MRA HEAD WITHOUT CONTRAST TECHNIQUE: Multiplanar, multiecho pulse sequences of the brain and surrounding structures were obtained without intravenous contrast. Angiographic images of the head were obtained using MRA technique without contrast. COMPARISON:  CTA and CT perfusion 12/02/2018 FINDINGS: MRI HEAD FINDINGS Brain: Acute infarct left anterior pons approximately 10 x 12 mm. 2 small areas of acute infarct in the left frontal lobe over the convexity, under 5 mm each. Ventricle size and cerebral volume normal. Mild chronic microvascular ischemic type changes in the white matter. Negative for hemorrhage or mass or fluid collection. Vascular: Normal arterial flow voids Skull and upper cervical spine: Negative Sinuses/Orbits: Mild mucosal edema paranasal sinuses. Bilateral cataract surgery Other: None MRA HEAD FINDINGS Mild atherosclerotic disease distal vertebral  artery bilaterally causing mild stenosis. Right PICA patent. Left PICA patent. Prominent AICA bilaterally. Basilar widely patent. Superior cerebellar and posterior cerebral arteries patent bilaterally. Posterior communicating artery patent bilaterally. Atherosclerotic disease in the cavernous carotid bilaterally without significant stenosis. Anterior and middle cerebral arteries are patent bilaterally without occlusion. Mild irregularity and stenosis M2 branches bilaterally. Negative for cerebral aneurysm. IMPRESSION: 1. 10 x 12 mm acute infarct left anterior pons. 2. Punctate areas of acute infarct in the left frontal lobe. 3. Mild chronic microvascular ischemic change in the white matter. 4. Mild intracranial atherosclerotic disease without large vessel occlusion. Mild stenosis distal vertebral artery bilaterally. Basilar appears widely patent. Electronically Signed   By: ChaFranchot GalloD.   On: 12/03/2018 11:07   Mr Brain Wo Contrast  Result Date: 12/03/2018 CLINICAL DATA:  Stroke.  Right-sided numbness EXAM: MRI HEAD WITHOUT CONTRAST MRA HEAD WITHOUT CONTRAST TECHNIQUE: Multiplanar, multiecho pulse sequences of the brain and surrounding structures were obtained without intravenous contrast. Angiographic images of the head were obtained using MRA technique without contrast. COMPARISON:  CTA and CT perfusion 12/02/2018 FINDINGS: MRI HEAD FINDINGS Brain: Acute infarct left anterior pons approximately 10 x 12 mm. 2 small areas of acute infarct in the left frontal lobe over the convexity, under 5 mm each. Ventricle  size and cerebral volume normal. Mild chronic microvascular ischemic type changes in the white matter. Negative for hemorrhage or mass or fluid collection. Vascular: Normal arterial flow voids Skull and upper cervical spine: Negative Sinuses/Orbits: Mild mucosal edema paranasal sinuses. Bilateral cataract surgery Other: None MRA HEAD FINDINGS Mild atherosclerotic disease distal vertebral artery  bilaterally causing mild stenosis. Right PICA patent. Left PICA patent. Prominent AICA bilaterally. Basilar widely patent. Superior cerebellar and posterior cerebral arteries patent bilaterally. Posterior communicating artery patent bilaterally. Atherosclerotic disease in the cavernous carotid bilaterally without significant stenosis. Anterior and middle cerebral arteries are patent bilaterally without occlusion. Mild irregularity and stenosis M2 branches bilaterally. Negative for cerebral aneurysm. IMPRESSION: 1. 10 x 12 mm acute infarct left anterior pons. 2. Punctate areas of acute infarct in the left frontal lobe. 3. Mild chronic microvascular ischemic change in the white matter. 4. Mild intracranial atherosclerotic disease without large vessel occlusion. Mild stenosis distal vertebral artery bilaterally. Basilar appears widely patent. Electronically Signed   By: Franchot Gallo M.D.   On: 12/03/2018 11:07   Ct Cerebral Perfusion W Contrast  Result Date: 12/02/2018 CLINICAL DATA:  Right-sided weakness and slurred speech EXAM: CT ANGIOGRAPHY HEAD AND NECK CT PERFUSION BRAIN TECHNIQUE: Multidetector CT imaging of the head and neck was performed using the standard protocol during bolus administration of intravenous contrast. Multiplanar CT image reconstructions and MIPs were obtained to evaluate the vascular anatomy. Carotid stenosis measurements (when applicable) are obtained utilizing NASCET criteria, using the distal internal carotid diameter as the denominator. Multiphase CT imaging of the brain was performed following IV bolus contrast injection. Subsequent parametric perfusion maps were calculated using RAPID software. CONTRAST:  52m OMNIPAQUE IOHEXOL 350 MG/ML SOLN COMPARISON:  Head CT same day FINDINGS: CTA NECK FINDINGS SKELETON: There is no bony spinal canal stenosis. No lytic or blastic lesion. OTHER NECK: Normal pharynx, larynx and major salivary glands. No cervical lymphadenopathy. Unremarkable  thyroid gland. UPPER CHEST: Incompletely visualized right pleural effusion. AORTIC ARCH: There is mild calcific atherosclerosis of the aortic arch. There is no aneurysm, dissection or hemodynamically significant stenosis of the visualized ascending aorta and aortic arch. Conventional 3 vessel aortic branching pattern. The visualized proximal subclavian arteries are widely patent. RIGHT CAROTID SYSTEM: --Common carotid artery: Widely patent origin without common carotid artery dissection or aneurysm. --Internal carotid artery: No dissection, occlusion or aneurysm. Mild atherosclerotic calcification at the carotid bifurcation without hemodynamically significant stenosis. --External carotid artery: No acute abnormality. LEFT CAROTID SYSTEM: --Common carotid artery: Widely patent origin without common carotid artery dissection or aneurysm. --Internal carotid artery: No dissection, occlusion or aneurysm. There is mixed density atherosclerosis extending into the proximal ICA, resulting in less than 50% stenosis. --External carotid artery: No acute abnormality. VERTEBRAL ARTERIES: Left dominant configuration. Both origins are clearly patent. No dissection, occlusion or flow-limiting stenosis to the skull base (V1-V3 segments). CTA HEAD FINDINGS POSTERIOR CIRCULATION: --Vertebral arteries: Calcification both V4 segments with mild-to-moderate narrowing bilaterally. --Posterior inferior cerebellar arteries (PICA): The left PICA and AICA share a common origin. Normal origin of right PICA from the ipsilateral vertebral artery. --Anterior inferior cerebellar arteries (AICA): Patent origins from the basilar artery. --Basilar artery: Normal. --Superior cerebellar arteries: Normal. --Posterior cerebral arteries (PCA): Normal. The left PCA is partially supplied by a posterior communicating artery (p-comm). ANTERIOR CIRCULATION: --Intracranial internal carotid arteries: Atherosclerotic calcification of the internal carotid arteries  at the skull base without hemodynamically significant stenosis. --Anterior cerebral arteries (ACA): Normal. Both A1 segments are present. Patent anterior communicating artery (a-comm). --Middle  cerebral arteries (MCA): Normal. VENOUS SINUSES: As permitted by contrast timing, patent. ANATOMIC VARIANTS: None Review of the MIP images confirms the above findings. CT Brain Perfusion Findings: ASPECTS: 10 CBF (<30%) Volume: 53m Perfusion (Tmax>6.0s) volume: 039mMismatch Volume: 19m27mnfarction Location:None IMPRESSION: 1. Normal CT perfusion scan of the brain. 2. No emergent large vessel occlusion or hemodynamically significant stenosis. 3. Bilateral carotid bifurcation atherosclerosis and atherosclerosis of the internal carotid arteries and vertebral arteries at the skull base without high-grade stenosis. 4. Incompletely visualized right pleural effusion. 5.  Aortic atherosclerosis (ICD10-I70.0). Electronically Signed   By: KevUlyses JarredD.   On: 12/02/2018 22:42   Dg Chest Port 1 View  Result Date: 12/02/2018 CLINICAL DATA:  Status post transcatheter aortic valve replacement. EXAM: PORTABLE CHEST 1 VIEW COMPARISON:  Radiographs of November 28, 2018. FINDINGS: Stable cardiomediastinal silhouette. Aortic valve prosthesis is noted. Atherosclerosis of thoracic aorta is noted. No pneumothorax is noted. Left lung is clear. Small right pleural effusion is noted with associated atelectasis. Bony thorax is unremarkable. IMPRESSION: Stable small right pleural effusion is noted with associated atelectasis. Aortic Atherosclerosis (ICD10-I70.0). Electronically Signed   By: JamMarijo ConceptionD.   On: 12/02/2018 18:50   Ct Head Code Stroke Wo Contrast  Result Date: 12/02/2018 CLINICAL DATA:  Code stroke.  Right-sided weakness EXAM: CT HEAD WITHOUT CONTRAST TECHNIQUE: Contiguous axial images were obtained from the base of the skull through the vertex without intravenous contrast. COMPARISON:  None. FINDINGS: Brain: There is no  mass, hemorrhage or extra-axial collection. The size and configuration of the ventricles and extra-axial CSF spaces are normal. The brain parenchyma is normal, without evidence of acute or chronic infarction. Vascular: Atherosclerotic calcification of the vertebral and internal carotid arteries at the skull base. No abnormal hyperdensity of the major intracranial arteries or dural venous sinuses. Skull: The visualized skull base, calvarium and extracranial soft tissues are normal. Sinuses/Orbits: No fluid levels or advanced mucosal thickening of the visualized paranasal sinuses. No mastoid or middle ear effusion. The orbits are normal. ASPECTS ([facility] Early CT Score) - Ganglionic level infarction (caudate, lentiform nuclei, internal capsule, insula, M1-M3 cortex): 7 - Supraganglionic infarction (M4-M6 cortex): 3 Total score (0-10 with 10 being normal): 10 IMPRESSION: 1. No acute abnormality. 2. ASPECTS is 10. These results were communicated to Dr. AshAmie Portland 10:32 pm on 12/02/2018 by text page via the [facility] system. Electronically Signed   By: KevUlyses JarredD.   On: 12/02/2018 22:32   Disposition   Pt is being discharged to inpatient rehab today in good condition.  Follow-up Plans & Appointments    Follow-up Information    ThoEileen StanfordA-C. Go on 01/15/2019.   Specialties: Cardiology, Radiology Why: please arrive at 12:30pm for an ultrasound of your heart followed by an appointment with KatNell Range-C  Contact information: [address] [phone]         Discharge Medications   Allergies as of 12/05/2018      Reactions   Mirtazapine    Sluggish    Pneumococcal Vaccine Itching, Other (See Comments)   Hives and fever   Statins Other (See Comments)   Joint pain   Sulfa Antibiotics Nausea And Vomiting      Medication List    STOP taking these medications   losartan 100 MG tablet Commonly known  as: COZAAR     TAKE these medications   aspirin EC 81  MG tablet Take 81 mg by mouth daily.   cholecalciferol 1000 units tablet Commonly known as: VITAMIN D Take 1,000 Units by mouth daily.   clopidogrel 75 MG tablet Commonly known as: PLAVIX Take 1 tablet (75 mg total) by mouth daily.   cyanocobalamin 1000 MCG tablet Take 1,000 mcg by mouth daily.   ferrous sulfate 325 (65 FE) MG tablet Take 325 mg by mouth daily with breakfast.   isosorbide mononitrate 30 MG 24 hr tablet Commonly known as: IMDUR Take 1 tablet (30 mg total) by mouth daily.   metoprolol succinate 50 MG 24 hr tablet Commonly known as: TOPROL-XL Take 50 mg by mouth every evening.   nitroGLYCERIN 0.4 MG SL tablet Commonly known as: NITROSTAT Place 0.4 mg under the tongue every 5 (five) minutes as needed for chest pain.   osimertinib mesylate 80 MG tablet Commonly known as: Tagrisso Take 1 tablet (80 mg total) by mouth daily. What changed: when to take this   pantoprazole 40 MG tablet Commonly known as: PROTONIX Take 40 mg by mouth daily.   triamcinolone ointment 0.5 % Commonly known as: KENALOG Apply 1 application topically 2 (two) times daily.       Outstanding Labs/Studies   NONE  Duration of Discharge Encounter   Greater than 30 minutes including physician time.  SignedAngelena Form, PA-C 12/05/2018, 1:35 PM [phone]
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
## 2018-12-05 NOTE — Progress Notes (Signed)
3 Days Post-Op Procedure(s) (LRB): TRANSCATHETER AORTIC VALVE REPLACEMENT, TRANSFEMORAL (N/A) TRANSESOPHAGEAL ECHOCARDIOGRAM (TEE) (N/A) Subjective: Sitting up in chair. No complaints except for neuro deficit which has not changed.  Objective: Vital signs in last 24 hours: Temp:  [97.7 F (36.5 C)-97.9 F (36.6 C)] 97.7 F (36.5 C) (08/28 0445) Pulse Rate:  [57-76] 58 (08/28 0619) Cardiac Rhythm: Normal sinus rhythm;Sinus bradycardia (08/28 0445) Resp:  [11-19] 12 (08/28 0619) BP: (145-170)/(35-60) 170/53 (08/28 0445) SpO2:  [97 %-100 %] 99 % (08/28 0619) Weight:  [50.3 kg] 50.3 kg (08/28 0619)  Hemodynamic parameters for last 24 hours:    Intake/Output from previous day: 08/27 0701 - 08/28 0700 In: 639.3 [P.O.:480; I.V.:59.3; IV Piggyback:100] Out: 650 [Urine:650] Intake/Output this shift: No intake/output data recorded.  General appearance: alert and cooperative Neurologic: right hemiparesis. Can lift right leg some. minimal movement in right thumb. Heart: regular rate and rhythm, S1, S2 normal, no murmur Lungs: clear to auscultation bilaterally Extremities: extremities normal Wound: groin sites ok  Lab Results: Recent Labs    12/04/18 0206 12/05/18 0334  WBC 3.3* 3.2*  HGB 10.1* 10.2*  HCT 29.7* 30.1*  PLT 51* 44*   BMET:  Recent Labs    12/04/18 0206 12/05/18 0334  NA 141 142  K 3.6 3.8  CL 113* 111  CO2 19* 24  GLUCOSE 92 101*  BUN 18 17  CREATININE 1.45* 1.40*  CALCIUM 8.4* 8.3*    PT/INR: No results for input(s): LABPROT, INR in the last 72 hours. ABG    Component Value Date/Time   PHART 7.374 11/28/2018 1402   HCO3 22.0 11/28/2018 1402   TCO2 22 12/02/2018 1600   ACIDBASEDEF 2.4 (H) 11/28/2018 1402   O2SAT 98.0 11/28/2018 1402   CBG (last 3)  Recent Labs    12/02/18 1031  GLUCAP 94    Assessment/Plan: S/P Procedure(s) (LRB): TRANSCATHETER AORTIC VALVE REPLACEMENT, TRANSFEMORAL (N/A) TRANSESOPHAGEAL ECHOCARDIOGRAM (TEE)  (N/A)  POD 3 Hemodynamically stable in sinus rhythm.  Left pontine and frontal embolic stroke with right hemiparesis. Continue PT/OT. I think she could go to CIR if accepted.     LOS: 3 days    Gaye Pollack 12/05/2018

## 2018-12-06 ENCOUNTER — Inpatient Hospital Stay (HOSPITAL_COMMUNITY): Payer: Medicare Other | Admitting: Occupational Therapy

## 2018-12-06 ENCOUNTER — Inpatient Hospital Stay (HOSPITAL_COMMUNITY): Payer: Medicare Other | Admitting: Speech Pathology

## 2018-12-06 ENCOUNTER — Inpatient Hospital Stay (HOSPITAL_COMMUNITY): Payer: Medicare Other | Admitting: Physical Therapy

## 2018-12-06 DIAGNOSIS — I6340 Cerebral infarction due to embolism of unspecified cerebral artery: Secondary | ICD-10-CM

## 2018-12-06 DIAGNOSIS — Z953 Presence of xenogenic heart valve: Secondary | ICD-10-CM

## 2018-12-06 LAB — CBC WITH DIFFERENTIAL/PLATELET
Abs Immature Granulocytes: 0.01 10*3/uL (ref 0.00–0.07)
Basophils Absolute: 0 10*3/uL (ref 0.0–0.1)
Basophils Relative: 0 %
Eosinophils Absolute: 0.1 10*3/uL (ref 0.0–0.5)
Eosinophils Relative: 3 %
HCT: 30.9 % — ABNORMAL LOW (ref 36.0–46.0)
Hemoglobin: 10.1 g/dL — ABNORMAL LOW (ref 12.0–15.0)
Immature Granulocytes: 0 %
Lymphocytes Relative: 23 %
Lymphs Abs: 0.7 10*3/uL (ref 0.7–4.0)
MCH: 29.6 pg (ref 26.0–34.0)
MCHC: 32.7 g/dL (ref 30.0–36.0)
MCV: 90.6 fL (ref 80.0–100.0)
Monocytes Absolute: 0.2 10*3/uL (ref 0.1–1.0)
Monocytes Relative: 6 %
Neutro Abs: 2 10*3/uL (ref 1.7–7.7)
Neutrophils Relative %: 68 %
Platelets: 42 10*3/uL — ABNORMAL LOW (ref 150–400)
RBC: 3.41 MIL/uL — ABNORMAL LOW (ref 3.87–5.11)
RDW: 12.8 % (ref 11.5–15.5)
WBC: 3 10*3/uL — ABNORMAL LOW (ref 4.0–10.5)
nRBC: 0 % (ref 0.0–0.2)

## 2018-12-06 MED ORDER — BACLOFEN 5 MG HALF TABLET
5.0000 mg | ORAL_TABLET | Freq: Three times a day (TID) | ORAL | Status: DC | PRN
  Administered 2018-12-06 – 2018-12-07 (×2): 5 mg via ORAL
  Filled 2018-12-06 (×2): qty 1

## 2018-12-06 MED ORDER — ISOSORBIDE MONONITRATE ER 30 MG PO TB24
30.0000 mg | ORAL_TABLET | Freq: Every day | ORAL | Status: DC
  Administered 2018-12-06 – 2018-12-09 (×4): 30 mg via ORAL
  Filled 2018-12-06 (×4): qty 1

## 2018-12-06 MED ORDER — METOPROLOL SUCCINATE ER 50 MG PO TB24
50.0000 mg | ORAL_TABLET | Freq: Every day | ORAL | Status: DC
  Administered 2018-12-06 – 2018-12-07 (×2): 50 mg via ORAL
  Filled 2018-12-06 (×2): qty 1

## 2018-12-06 MED ORDER — ALPRAZOLAM 0.25 MG PO TABS
0.2500 mg | ORAL_TABLET | Freq: Three times a day (TID) | ORAL | Status: DC | PRN
  Administered 2018-12-06 – 2018-12-07 (×2): 0.25 mg via ORAL
  Filled 2018-12-06 (×2): qty 1
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
## 2018-12-06 NOTE — Progress Notes (Signed)
[facility] PHYSICAL MEDICINE & REHABILITATION PROGRESS NOTE   Subjective/Complaints: Says she woke up feeling dizzy and nauseas this morning. RN later reports headache.   ROS: Patient denies fever, rash, sore throat, blurred vision, nausea, vomiting, diarrhea, cough, shortness of breath or chest pain,   or mood change.    Objective:   No results found. Recent Labs    12/05/18 0334 12/06/18 0717  WBC 3.2* 3.0*  HGB 10.2* 10.1*  HCT 30.1* 30.9*  PLT 44* 42*   Recent Labs    12/04/18 0206 12/05/18 0334  NA 141 142  K 3.6 3.8  CL 113* 111  CO2 19* 24  GLUCOSE 92 101*  BUN 18 17  CREATININE 1.45* 1.40*  CALCIUM 8.4* 8.3*    Intake/Output Summary (Last 24 hours) at 12/06/2018 0942 Last data filed at 12/06/2018 0733 Gross per 24 hour  Intake 240 ml  Output -  Net 240 ml     Physical Exam: Vital Signs Blood pressure (!) 158/46, pulse 68, temperature 98 F (36.7 C), temperature source Oral, resp. rate 16, height 5\' 1"  (1.549 m), weight 55.2 kg, SpO2 98 %. Constitutional: No distress . Vital signs reviewed. HEENT: EOMI, oral membranes moist Neck: supple Cardiovascular: RRR with Sys murmur. No JVD    Respiratory: CTA Bilaterally without wheezes or rales. Normal effort    GI: BS +, non-tender, non-distended  Musculoskeletal:Normal range of motion.  General: No deformityor edema.  Neurological: She isalertand oriented to person, place, and time. Reasonable insight and awareness. Functional language and memory. Right central 7 with minimal dysarthria. No nystagmus. RUE 0/5 except thumb which is trace, RLE 1 to 1+/5 HE, KE, trace ADF/PF.--no changes.  LUE and LLE 4+/5. No focal sensory findings. DTRs tr left and 1+ right. Skin: pleasant, somewhat anxious.     Assessment/Plan: 1. Functional deficits secondary to left pontine/frontal infarcts which require 3+ hours per day of interdisciplinary therapy in a comprehensive inpatient rehab setting.  Physiatrist  is providing close team supervision and 24 hour management of active medical problems listed below.  Physiatrist and rehab team continue to assess barriers to discharge/monitor patient progress toward functional and medical goals  Care Tool:  Bathing              Bathing assist       Upper Body Dressing/Undressing Upper body dressing        Upper body assist      Lower Body Dressing/Undressing Lower body dressing            Lower body assist       Toileting Toileting    Toileting assist Assist for toileting: Moderate Assistance - Patient 50 - 74%     Transfers Chair/bed transfer  Transfers assist     Chair/bed transfer assist level: Moderate Assistance - Patient 50 - 74%     Locomotion Ambulation   Ambulation assist              Walk 10 feet activity   Assist           Walk 50 feet activity   Assist           Walk 150 feet activity   Assist           Walk 10 feet on uneven surface  activity   Assist           Wheelchair     Assist  Wheelchair 50 feet with 2 turns activity    Assist            Wheelchair 150 feet activity     Assist          Blood pressure (!) 158/46, pulse 68, temperature 98 F (36.7 C), temperature source Oral, resp. rate 16, height 5\' 1"  (1.549 m), weight 55.2 kg, SpO2 98 %.  Medical Problem List and Plan: 1.Functional deficits and right hemiparesissecondary to left pontine and frontal infarcts, likely embolic after TAVR --Patient is beginning CIR therapies today including PT, OT, and SLP  -new onset dizziness and headache.   -don't see any neuro changes on exam  -BP elevated this morning with headache--see below  -?anxiety component  2. Antithrombotics: -DVT/anticoagulation:No heparin d/t thrombocytopenia -SCD's -antiplatelet therapy: continue plavix and asa 3. Pain Management:tylenol  -tramadol  prn for more severe pain 4. Mood:team to provide ego support. Appears motivated and positive -antipsychotic agents: n/a  -xanax prn for anxiety 5. Neuropsych: This patientiscapable of making decisions on herown behalf. 6. Skin/Wound Care:Monitor incisions for healing. 7. Fluids/Electrolytes/Nutrition:Monitor I/O.check lytes on Monday.    -nutritional supp    8. 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  9.Acute on chronic CKD: Monitor with serial checks. Baseline serum creatinine at 1.43 and serum creatinine trending back down. 10. Acute on chronic anemia: Improved to baseline post transfusion. Continue iron supplement.   -I personally reviewed the patient's labs today.   11.Chronic thrombocytopenia: platelets nowbelow 50. -monitor serially 12.Metastatic lung cancer: Continue Tagrisso 13. Chronic right pleural effusion: Plans for intervention in the future--?Pleurex v/s VATS 14. Chronic diastolic CHF,HTN: Compensated--check daily weights, heart healthy diet.  Resume Imdur and Toprol XL today per cardiology recs   Filed Weights   12/05/18 1733 12/06/18 0529  Weight: 54.5 kg 55.2 kg    15. Constipation:  scheduled senna at bedtime.    LOS: 1 days A FACE TO [place] 12/06/2018, 9:42 AM
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
## 2018-12-06 NOTE — Progress Notes (Signed)
Occupational Therapy Note  Patient Details  Name: Stacy Cook MRN: [id number] Date of Birth: May 07, 1935  Pt seen for OT eval, limited session secondary to pt reporting increased pain in her head as a sharp almost spasmic pain initially which spread down to her trunk on the slight left side after a while.  She was able to transfer OOB and work on bathing at the sink some before these issues began.  BP taken multiple times in sitting after episodes started: 200/60, 210/54, and 207/31.  Pt transferred back to the EOB with nursing notified and BP taken manually by nursing in supine at 206/89.  Pt left in bed with call button and phone in reach and safety alarm in place.  OT eval documentation to be completed and filed later today.  Pt missed 20 mins of OT session.  Josuha Fontanez OTR/L  12/06/2018, 10:00 AM

## 2018-12-06 NOTE — Progress Notes (Signed)
Orthopedic Tech Progress Note Patient Details:  Stacy Cook [facility] 09-20-1935 [id number] Called in order to [facility] for a WHO SPLINT Patient ID: Stacy Cook, female   DOB: September 17, 1935, 83 y.o.   MRN: [id number]   Janit Pagan 12/06/2018, 8:56 AM
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
## 2018-12-06 NOTE — Progress Notes (Signed)
Stacy Cook noted with a blood pressure of 206/89 expressed concerns of feeling dizzy/nauseous and having sharp spasms and pain on the left side of the body. MD Naaman Plummer notified and came back to reassess the Stacy Cook new orders has been given. Staff will continue to monitor and assess Stacy Cook, Stacy Cook is alert, stable lying in bed.

## 2018-12-07 ENCOUNTER — Inpatient Hospital Stay (HOSPITAL_COMMUNITY): Payer: Medicare Other | Admitting: Physical Therapy

## 2018-12-07 DIAGNOSIS — I749 Embolism and thrombosis of unspecified artery: Secondary | ICD-10-CM

## 2018-12-07 DIAGNOSIS — R55 Syncope and collapse: Secondary | ICD-10-CM

## 2018-12-07 DIAGNOSIS — E876 Hypokalemia: Secondary | ICD-10-CM

## 2018-12-07 LAB — BASIC METABOLIC PANEL
Anion gap: 10 (ref 5–15)
BUN: 23 mg/dL (ref 8–23)
CO2: 25 mmol/L (ref 22–32)
Calcium: 8.9 mg/dL (ref 8.9–10.3)
Chloride: 107 mmol/L (ref 98–111)
Creatinine, Ser: 1.32 mg/dL — ABNORMAL HIGH (ref 0.44–1.00)
GFR calc Af Amer: 44 mL/min — ABNORMAL LOW (ref >60)
GFR calc non Af Amer: 38 mL/min — ABNORMAL LOW (ref >60)
Glucose, Bld: 94 mg/dL (ref 70–99)
Potassium: 4.1 mmol/L (ref 3.5–5.1)
Sodium: 142 mmol/L (ref 135–145)

## 2018-12-07 LAB — CBC
HCT: 31.8 % — ABNORMAL LOW (ref 36.0–46.0)
Hemoglobin: 10.5 g/dL — ABNORMAL LOW (ref 12.0–15.0)
MCH: 30.1 pg (ref 26.0–34.0)
MCHC: 33 g/dL (ref 30.0–36.0)
MCV: 91.1 fL (ref 80.0–100.0)
Platelets: 42 10*3/uL — ABNORMAL LOW (ref 150–400)
RBC: 3.49 MIL/uL — ABNORMAL LOW (ref 3.87–5.11)
RDW: 12.8 % (ref 11.5–15.5)
WBC: 3.4 10*3/uL — ABNORMAL LOW (ref 4.0–10.5)
nRBC: 0 % (ref 0.0–0.2)

## 2018-12-07 MED ORDER — POTASSIUM CHLORIDE CRYS ER 20 MEQ PO TBCR
20.0000 meq | EXTENDED_RELEASE_TABLET | Freq: Two times a day (BID) | ORAL | Status: DC
  Administered 2018-12-07 – 2018-12-09 (×5): 20 meq via ORAL
  Filled 2018-12-07 (×5): qty 1

## 2018-12-07 MED ORDER — LOSARTAN POTASSIUM 50 MG PO TABS
100.0000 mg | ORAL_TABLET | Freq: Every day | ORAL | Status: DC
  Administered 2018-12-08 – 2018-12-09 (×2): 100 mg via ORAL
  Filled 2018-12-07 (×2): qty 2

## 2018-12-07 MED ORDER — SORBITOL 70 % SOLN
60.0000 mL | Status: AC
  Administered 2018-12-07: 60 mL via ORAL
  Filled 2018-12-07: qty 60
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
## 2018-12-07 NOTE — Progress Notes (Signed)
Assisted with and agree with previous nurse's assessment, after fall. Stacy Cook A

## 2018-12-07 NOTE — Progress Notes (Signed)
Given 1 degree AVB on EKG, will stop metoprolol and resume losartan 100mg  daily beginning tomorrow

## 2018-12-07 NOTE — Progress Notes (Addendum)
Stacy Cook PHYSICAL MEDICINE & REHABILITATION PROGRESS NOTE   Subjective/Complaints: Pt became dizzy last night while attempting to defecate and fell from [facility]. Suffered skin tear right wrist. Denies any heart palpitations or preceding dizziness leading up to when she went out. Baclofen helped left sided spasms.  ROS: Patient denies fever, rash, sore throat, blurred vision, nausea, vomiting, diarrhea, cough, shortness of breath or chest pain, joint or back pain, headache, or mood change.    Objective:   No results found. Recent Labs    12/05/18 0334 12/06/18 0717  WBC 3.2* 3.0*  HGB 10.2* 10.1*  HCT 30.1* 30.9*  PLT 44* 42*   Recent Labs    12/05/18 0334  NA 142  K 3.8  CL 111  CO2 24  GLUCOSE 101*  BUN 17  CREATININE 1.40*  CALCIUM 8.3*   No intake or output data in the 24 hours ending 12/07/18 0804   Physical Exam: Vital Signs Blood pressure (!) 158/43, pulse 70, temperature 98.1 F (36.7 C), temperature source Oral, resp. rate 17, height 5\' 1"  (1.549 m), weight 56.5 kg, SpO2 97 %. Constitutional: No distress . Vital signs reviewed. HEENT: EOMI, oral membranes moist Neck: supple Cardiovascular: RRR with murmur. No JVD    Respiratory: CTA Bilaterally without wheezes or rales. Normal effort    GI: BS +, non-tender, non-distended  Musculoskeletal:Normal range of motion.  General: No deformityor edema.  Neurological: She isalertand oriented to person, place, and time. Fair insight and awareness. Right C7. No nystagmus. RUE 0/5 except thumb which is trace, RLE 1 to 1+/5 HE, KE, 1/5 APF, trace ADF.---stable to improved.  LUE and LLE 4+/5. No focal sensory findings. DTRs tr left and 1+ right. Skin: pleasant     Assessment/Plan: 1. Functional deficits secondary to left pontine/frontal infarcts which require 3+ hours per day of interdisciplinary therapy in a comprehensive inpatient rehab setting.  Physiatrist is providing close team supervision and 24  hour management of active medical problems listed below.  Physiatrist and rehab team continue to assess barriers to discharge/monitor patient progress toward functional and medical goals  Care Tool:  Bathing    Body parts bathed by patient: Right arm, Chest, Abdomen, Right upper leg, Left upper leg, Face, Left lower leg(simulated)   Body parts bathed by helper: Left arm, Front perineal area, Buttocks, Right lower leg     Bathing assist       Upper Body Dressing/Undressing Upper body dressing   What is the patient wearing?: Pull over shirt    Upper body assist Assist Level: Moderate Assistance - Patient 50 - 74%(simulated)    Lower Body Dressing/Undressing Lower body dressing      What is the patient wearing?: Hospital gown only     Lower body assist Assist for lower body dressing: Supervision/Verbal cueing     Toileting Toileting    Toileting assist Assist for toileting: Moderate Assistance - Patient 50 - 74%     Transfers Chair/bed transfer  Transfers assist     Chair/bed transfer assist level: Moderate Assistance - Patient 50 - 74%     Locomotion Ambulation   Ambulation assist   Ambulation activity did not occur: Safety/medical concerns(requires hallway rail to ambulate and was independent PTA)          Walk 10 feet activity   Assist  Walk 10 feet activity did not occur: Safety/medical concerns(requires hallway rail to ambulate and was independent PTA)        Walk 50 feet activity  Assist Walk 50 feet with 2 turns activity did not occur: Safety/medical concerns(requires hallway rail to ambulate and was independent PTA)         Walk 150 feet activity   Assist Walk 150 feet activity did not occur: Safety/medical concerns(requires hallway rail to ambulate and was independent PTA)         Walk 10 feet on uneven surface  activity   Assist Walk 10 feet on uneven surfaces activity did not occur: Safety/medical concerns          Wheelchair     Assist Will patient use wheelchair at discharge?: Yes Type of Wheelchair: Manual    Wheelchair assist level: Set up assist, Supervision/Verbal cueing Max wheelchair distance: 180ft    Wheelchair 50 feet with 2 turns activity    Assist        Assist Level: Set up assist, Supervision/Verbal cueing   Wheelchair 150 feet activity     Assist      Assist Level: Set up assist, Supervision/Verbal cueing   Blood pressure (!) 158/43, pulse 70, temperature 98.1 F (36.7 C), temperature source Oral, resp. rate 17, height 5\' 1"  (1.549 m), weight 56.5 kg, SpO2 97 %.  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      2. Antithrombotics: -DVT/anticoagulation:No heparin d/t thrombocytopenia -SCD's  -platelets stable at 42k today -antiplatelet therapy: continue plavix and asa 3. Pain Management:tylenol  -tramadol prn for more severe pain  -baclofen 5mg  q8 prn spasms--appears to have helped 4. Mood:team to provide ego support. Appears motivated and positive -antipsychotic agents: n/a  -xanax prn for anxiety 5. Neuropsych: This patientiscapable of making decisions on herown behalf. 6. Skin/Wound Care:Monitor incisions for healing. 7. Fluids/Electrolytes/Nutrition: encourage PO    -nutritional supp   -replete K+ 8. 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  9.Acute on chronic CKD: Monitor with serial checks. Baseline serum creatinine at 1.43 and serum creatinine trending back down. 10. Acute on chronic anemia: Improved to baseline post transfusion. Continue iron supplement.   -recheck labs today.   11.Chronic thrombocytopenia: platelets nowbelow  50. -monitor serially 12.Metastatic lung cancer: Continue Tagrisso 13. Chronic right pleural effusion: Plans for intervention in the future--?Pleurex v/s VATS 14. Chronic diastolic CHF,HTN: Compensated--check daily weights, heart healthy diet.  Resumed Imdur and Toprol XL today per cardiology recs   Filed Weights   12/05/18 1733 12/06/18 0529 12/07/18 0442  Weight: 54.5 kg 55.2 kg 56.5 kg     15. Constipation:  scheduled senna at bedtime.  -still no bm since admit  -sorbitol today  -fleet enema 16. Syncopal episode?  -?vasovagal event while trying to defecate  -check orthostatic vs  -re-check bmet/cbc today  -check EKG  -encourage fluids   Greater than 35 total minutes was spent in examination of patient, assessment of pertinent data,  formulation of a treatment plan, and in discussion with patient and/or family.     LOS: 2 days A FACE TO [place] 12/07/2018, 8:04 AM

## 2018-12-07 NOTE — Progress Notes (Signed)
Physical Therapy Session Note  Patient Details  Name: Stacy Cook MRN: [id number] Date of Birth: 1935-10-11  Today's Date: 12/07/2018 PT Individual Time: 1025-1118 PT Individual Time Calculation (min): 53 min   Short Term Goals: Week 1:  PT Short Term Goal 1 (Week 1): Pt will perform bed mobility with min assist PT Short Term Goal 2 (Week 1): Pt will perform bed<>chair transfers with min assist PT Short Term Goal 3 (Week 1): Patient will ambulate at least 32ft using LRAD with mod assist PT Short Term Goal 4 (Week 1): Pt will initiate stair training  Skilled Therapeutic Interventions/Progress Updates:   Pt received on BSC due to receiving a laxative.  Pt required increased time on BSC to void completely; pt performed multiple scoots to edge of BSC and forward leaning to allow therapist to assist with hygiene.  Twice after completing hygiene pt had to return to voiding again.  After pt felt she was finished and hygiene completed, pt stood with mod HHA with LUE and maintained standing with one UE support on RW while therapist assisted with pulling up underwear.  Performed stand pivot transfer to EOB with min-mod HHA to allow therapist to empty BSC.  Pt performed stand pivot to w/c with min-mod HHA and then transported to gym for car transfer.  Simulated height of [place] and pt performed w/c <> car stand pivot with LUE support on car and therapist supporting RUE min-mod A; min A to assist with lifting RLE into and out of car.  Also performed stair negotiation with LUE support on rail and therapist providing mod A for weight shifting and balance; pt did not require assistance to stabilize RLE during stance.  Performed 4 stairs, 6" tall with step to sequence leading with LLE to ascend, RLE to descend.  Mild assistance required to fully clear RLE.  Provided cues to activate hip and knee flexion to advance to next step.    Basic cushion placed in patient's wheelchair.  Returning to room pt reporting  slight incontinence of bowel - returned to [facility] stand pivot with mod A.  Therapist assisted with removing pad from underwear.  Pt left on BSC with NT present; NT to assist with hygiene and returning patient to bed.     Therapy Documentation Precautions:  Precautions Precautions: Fall Precaution Comments: right hemiparesis Restrictions Weight Bearing Restrictions: No Pain: Pain Assessment Pain Scale: 0-10 Pain Score: 0-No pain   Therapy/Group: Individual Therapy   Rico Junker, PT, DPT 12/07/18    3:26 PM     12/07/2018, 3:26 PM

## 2018-12-07 NOTE — Progress Notes (Deleted)
Pharmacy notified staff this morning that patient was out of chemo mediation. Son Merry Proud) was notified and is aware to bring back to the hospital to be administered. Son states its a process with receiving medication and will get it here as soon as he can. Patient is aware of issue.

## 2018-12-07 NOTE — Progress Notes (Signed)
Patient was placed on the bedside commode by the nurse tech. When tech turn around to get wash clothes for care a fall occurred. Patient states " I remember becoming dizzy, but remembers nothing else afterwards." Mrs. Hanning is awake, alert and oriented X4. She is able to state her name and that she is in the hospital. Skin is warm and dry. Right hand (top) has a skin tear. Skin tear clean with normal saline, steri strips, and Tegaderm applied.  Minor wound to the top right side of her lip. Mouth care performed. Respirations regular and unlabored. Lung sounds clear. Pupils PERRLA. ROM all extremities  (purposeful movement). Right hand grip is weak due to previous embolic stroke. Left hand grip strong, peripheral pulses are 2+ at radius and 1+ at dorsalis pedis pulse. Right pedal push is weaker but unchanged since admission. Voided clear amber urine just before fall. Last bowel movement noted 8/23, miralax and a suppository was administered by day nurse. Patient denies pain at this time, but states she is experiencing dizziness. Writer will obtain a set of ortho vitals. Neuro checks initiated.  Dr. Naaman Plummer notified no new orders given. [name]) aware.

## 2018-12-08 ENCOUNTER — Other Ambulatory Visit: Payer: Self-pay | Admitting: Physician Assistant

## 2018-12-08 ENCOUNTER — Inpatient Hospital Stay (INDEPENDENT_AMBULATORY_CARE_PROVIDER_SITE_OTHER): Payer: Medicare Other

## 2018-12-08 ENCOUNTER — Inpatient Hospital Stay (HOSPITAL_COMMUNITY): Payer: Medicare Other | Admitting: Physical Therapy

## 2018-12-08 ENCOUNTER — Inpatient Hospital Stay (HOSPITAL_COMMUNITY): Payer: Medicare Other | Admitting: Speech Pathology

## 2018-12-08 ENCOUNTER — Inpatient Hospital Stay (HOSPITAL_COMMUNITY): Payer: Medicare Other | Admitting: Occupational Therapy

## 2018-12-08 ENCOUNTER — Inpatient Hospital Stay (HOSPITAL_COMMUNITY): Payer: Medicare Other

## 2018-12-08 DIAGNOSIS — D631 Anemia in chronic kidney disease: Secondary | ICD-10-CM

## 2018-12-08 DIAGNOSIS — R55 Syncope and collapse: Secondary | ICD-10-CM | POA: Diagnosis not present

## 2018-12-08 DIAGNOSIS — D638 Anemia in other chronic diseases classified elsewhere: Secondary | ICD-10-CM

## 2018-12-08 DIAGNOSIS — N179 Acute kidney failure, unspecified: Secondary | ICD-10-CM

## 2018-12-08 DIAGNOSIS — N183 Chronic kidney disease, stage 3 (moderate): Secondary | ICD-10-CM

## 2018-12-08 DIAGNOSIS — D696 Thrombocytopenia, unspecified: Secondary | ICD-10-CM

## 2018-12-08 LAB — COMPREHENSIVE METABOLIC PANEL
ALT: 12 U/L (ref 0–44)
AST: 17 U/L (ref 15–41)
Albumin: 3 g/dL — ABNORMAL LOW (ref 3.5–5.0)
Alkaline Phosphatase: 44 U/L (ref 38–126)
Anion gap: 6 (ref 5–15)
BUN: 28 mg/dL — ABNORMAL HIGH (ref 8–23)
CO2: 23 mmol/L (ref 22–32)
Calcium: 8.8 mg/dL — ABNORMAL LOW (ref 8.9–10.3)
Chloride: 113 mmol/L — ABNORMAL HIGH (ref 98–111)
Creatinine, Ser: 1.57 mg/dL — ABNORMAL HIGH (ref 0.44–1.00)
GFR calc Af Amer: 36 mL/min — ABNORMAL LOW (ref >60)
GFR calc non Af Amer: 31 mL/min — ABNORMAL LOW (ref >60)
Glucose, Bld: 98 mg/dL (ref 70–99)
Potassium: 4.5 mmol/L (ref 3.5–5.1)
Sodium: 142 mmol/L (ref 135–145)
Total Bilirubin: 0.9 mg/dL (ref 0.3–1.2)
Total Protein: 5.3 g/dL — ABNORMAL LOW (ref 6.5–8.1)

## 2018-12-08 MED ORDER — TRAMADOL HCL 50 MG PO TABS: 50.0000 mg | ORAL_TABLET | Freq: Three times a day (TID) | ORAL | Status: DC | PRN

## 2018-12-08 MED ORDER — SODIUM CHLORIDE 0.9 % IV SOLN
INTRAVENOUS | Status: DC
  Administered 2018-12-08: 18:00:00 via INTRAVENOUS

## 2018-12-08 MED ORDER — BACLOFEN 5 MG HALF TABLET: 5.0000 mg | ORAL_TABLET | Freq: Two times a day (BID) | ORAL | Status: DC | PRN

## 2018-12-08 NOTE — Progress Notes (Signed)
Physical Therapy Session Note  Patient Details  Name: Stacy Cook MRN: [id number] Date of Birth: 1935/08/03  Today's Date: 12/08/2018 PT Individual Time: 0900-1000 PT Individual Time Calculation (min): 60 min   Short Term Goals: Week 1:  PT Short Term Goal 1 (Week 1): Pt will perform bed mobility with min assist PT Short Term Goal 2 (Week 1): Pt will perform bed<>chair transfers with min assist PT Short Term Goal 3 (Week 1): Patient will ambulate at least 26ft using LRAD with mod assist PT Short Term Goal 4 (Week 1): Pt will initiate stair training  Skilled Therapeutic Interventions/Progress Updates:   Focused on functional bed mobility retraining with min to mod assist for facilitation at trunk, cues for technique and correct positioning of RUE with min to mod assist for reciprocal scooting to edge of bed. PT assisted with donning of pants and underwear for time management (and [place]) and focused on sit <> stands and dynamic standing balance during dressing tasks. Pt able to maintain balance with close supervision to min assist and LUE support with sit <> stands progressing to min assist with repetition. Pt performed stand step transfers with min assist for facilitation of weightshift during stepping. Mild instability noted in RLE during stance activities. PT changed out w/c to 16x16 for improved fit for better positioning and sitting tolerance, added R lap tray for RUE support, and got R hand orthosis to attempt with RW in future session (would benefit from youth RW but none available at this time). Educated on importance of RUE positioning during movement and attention.   Therapy Documentation Precautions:  Precautions Precautions: Fall Precaution Comments: right hemiparesis Restrictions Weight Bearing Restrictions: No  Pain: Denies pain.   Therapy/Group: Individual Therapy  Canary Brim Ivory Broad, PT, DPT, CBIS  12/08/2018, 11:59 AM

## 2018-12-08 NOTE — Progress Notes (Signed)
Occupational Therapy Session Note  Patient Details  Name: Stacy Cook MRN: [id number] Date of Birth: 1935-12-15  Today's Date: 12/08/2018 OT Individual Time: 1102-1200 OT Individual Time Calculation (min): 58 min   Short Term Goals: Week 1:  OT Short Term Goal 1 (Week 1): Pt will complete UB dressing to donn a pullover shirt with min assist following hemidressing techniques. OT Short Term Goal 2 (Week 1): Pt will complete bathing sit to stand with min assist. OT Short Term Goal 3 (Week 1): Pt will peform toilet transfer with min assist stand pivot OT Short Term Goal 4 (Week 1): Pt will use the RUE as a gross assist with self care and grooming tasks with mod facilitation. OT Short Term Goal 5 (Week 1): Pt will complete LB dressing with mod assist for all aspects sit to stand.  Skilled Therapeutic Interventions/Progress Updates:    Pt greeted sitting in wc and agreeable to OT treatment session. Pt declined to shower today 2/2 not wanting to have to take off pants. Pt agreeable to UB bathing/dressing at the sink. Worked on functional use of R UE within bathing tasks with OT providing hand over hand A to integrate R UE into bathing tasks. OT educated on hemi dressing techniques with pt still needing assistance to thread R UE through shirt sleeve. Pt brought to therapy gym and worked on [place] with towel pushes. Pt with increased shoulder flexion/ext, and scapula protraction/retraction. Pt returned to room and completed stand-pivot to the commode with mod A. Pt voided bladder and completed peri-care with set-up A. Assistance for clothing management. Pt left seated in wc at end of session with chair alarm on and needs met.   Therapy Documentation Precautions:  Precautions Precautions: Fall Precaution Comments: right hemiparesis Restrictions Weight Bearing Restrictions: No Pain: Pain Assessment Pain Scale: 0-10 Pain Score: 0-No pain   Therapy/Group: Individual Therapy  Valma Cava 12/08/2018, 11:29 AM

## 2018-12-08 NOTE — Progress Notes (Signed)
Speech Language Pathology Daily Session Note  Patient Details  Name: Rilya Longo MRN: [id number] Date of Birth: [birth year]  Today's Date: 12/08/2018 SLP Individual Time: 1420-1505 SLP Individual Time Calculation (min): 45 min  Short Term Goals: Week 1: SLP Short Term Goal 1 (Week 1): Pt will overarticulate, increase vocal intensity, and slow rate to achieve intelligibility at the conversational level with supervision  Skilled Therapeutic Interventions: Skilled treatment session focused on speech goals. Patient independently recalled her speech intelligibility strategies and utilized the strategies at the conversation level with Mod I. Patient was 100% intelligible in a moderately noisy environment. Patient left upright in bed with alarm on and all needs within reach. Continue with current plan of care.      Pain No/Denies Pain   Therapy/Group: Individual Therapy  Ebin Palazzi 12/08/2018, 3:08 PM

## 2018-12-08 NOTE — Progress Notes (Signed)
[place] PHYSICAL MEDICINE & REHABILITATION PROGRESS NOTE   Subjective/Complaints:   Pt reports feeling good today- ate oatmeal for breakfast- on phone with family, and she nor they had any concerns/questions.  1st degrees AV block on EKG last night- Losartan restarted- metoprolol d/c'd.  ROS: Patient denies fever, rash, sore throat, blurred vision, nausea, vomiting, diarrhea, cough, shortness of breath or chest pain, joint or back pain, headache, or mood change.    Objective:   No results found. Recent Labs    12/06/18 0717 12/07/18 0837  WBC 3.0* 3.4*  HGB 10.1* 10.5*  HCT 30.9* 31.8*  PLT 42* 42*   Recent Labs    12/07/18 0837 12/08/18 0605  NA 142 142  K 4.1 4.5  CL 107 113*  CO2 25 23  GLUCOSE 94 98  BUN 23 28*  CREATININE 1.32* 1.57*  CALCIUM 8.9 8.8*    Intake/Output Summary (Last 24 hours) at 12/08/2018 1017 Last data filed at 12/08/2018 0828 Gross per 24 hour  Intake 662 ml  Output -  Net 662 ml     Physical Exam: Vital Signs Blood pressure (!) 159/47, pulse 66, temperature 97.6 F (36.4 C), temperature source Oral, resp. rate 16, height 5\' 1"  (1.549 m), weight 54.9 kg, SpO2 97 %. Constitutional: No distress . Vital signs reviewed. Sitting up in bed, on phone, breakfast tray at bedside, oatmeal and drinks gone, NAD HEENT: EOMI, oral membranes moist Neck: supple Cardiovascular: RRR with murmur. No JVD    Respiratory: CTA Bilaterally without wheezes or rales. Normal effort    GI: BS +, non-tender, non-distended  Musculoskeletal:Normal range of motion.  General: No deformityor edema.  Neurological: She isalertand oriented to person, place, and time; stable Fair insight and awareness. Right C7. No nystagmus. RUE 0/5 except thumb which is trace, RLE 1 to 1+/5 HE, KE, 1/5 APF, trace ADF.---stable to improved.  LUE and LLE 4+/5. No focal sensory findings. DTRs tr left and 1+ right. Skin: no skin breakdown noted Psych-  pleasant    Assessment/Plan: 1. Functional deficits secondary to left pontine/frontal infarcts which require 3+ hours per day of interdisciplinary therapy in a comprehensive inpatient rehab setting.  Physiatrist is providing close team supervision and 24 hour management of active medical problems listed below.  Physiatrist and rehab team continue to assess barriers to discharge/monitor patient progress toward functional and medical goals  Care Tool:  Bathing    Body parts bathed by patient: Right arm, Chest, Abdomen, Right upper leg, Left upper leg, Face, Left lower leg(simulated)   Body parts bathed by helper: Left arm, Front perineal area, Buttocks, Right lower leg     Bathing assist       Upper Body Dressing/Undressing Upper body dressing   What is the patient wearing?: Pull over shirt    Upper body assist Assist Level: Moderate Assistance - Patient 50 - 74%(simulated)    Lower Body Dressing/Undressing Lower body dressing      What is the patient wearing?: Hospital gown only, Underwear/pull up     Lower body assist Assist for lower body dressing: Total Assistance - Patient < 25%     Toileting Toileting    Toileting assist Assist for toileting: Moderate Assistance - Patient 50 - 74%     Transfers Chair/bed transfer  Transfers assist     Chair/bed transfer assist level: Moderate Assistance - Patient 50 - 74%     Locomotion Ambulation   Ambulation assist   Ambulation activity did not occur: Safety/medical concerns(requires  hallway rail to ambulate and was independent PTA)  Assist level: Moderate Assistance - Patient 50 - 74% Assistive device: Hand held assist Max distance: 10   Walk 10 feet activity   Assist  Walk 10 feet activity did not occur: Safety/medical concerns(requires hallway rail to ambulate and was independent PTA)  Assist level: Moderate Assistance - Patient - 50 - 74% Assistive device: Hand held assist   Walk 50 feet  activity   Assist Walk 50 feet with 2 turns activity did not occur: Safety/medical concerns         Walk 150 feet activity   Assist Walk 150 feet activity did not occur: Safety/medical concerns         Walk 10 feet on uneven surface  activity   Assist Walk 10 feet on uneven surfaces activity did not occur: Safety/medical concerns         Wheelchair     Assist Will patient use wheelchair at discharge?: Yes Type of Wheelchair: Manual    Wheelchair assist level: Set up assist, Supervision/Verbal cueing Max wheelchair distance: 119ft    Wheelchair 50 feet with 2 turns activity    Assist        Assist Level: Set up assist, Supervision/Verbal cueing   Wheelchair 150 feet activity     Assist      Assist Level: Set up assist, Supervision/Verbal cueing   Blood pressure (!) 159/47, pulse 66, temperature 97.6 F (36.4 C), temperature source Oral, resp. rate 16, height 5\' 1"  (1.549 m), weight 54.9 kg, SpO2 97 %.  Medical Problem List and Plan: 1.Functional deficits and right hemiparesissecondary to left pontine and frontal infarcts, likely embolic after TAVR --continue CIR therapies including PT, OT, and SLP  -syncopal episode 8/29 when having BM     2. Antithrombotics: -DVT/anticoagulation:No heparin d/t thrombocytopenia -SCD's  -platelets stable at 42k today -antiplatelet therapy: continue plavix and asa 3. Pain Management:tylenol  -tramadol prn for more severe pain  -baclofen 5mg  q8 prn spasms--appears to have helped  8/31- reduced baclofen to q12 hrs prn due to CrCl as well as tramadol reduced to q8 hours prn 4. Mood:team to provide ego support. Appears motivated and positive -antipsychotic agents: n/a  -xanax prn for anxiety 5. Neuropsych: This patientiscapable of making decisions on herown behalf. 6. Skin/Wound Care:Monitor incisions for healing. 7.  Fluids/Electrolytes/Nutrition: encourage PO    -nutritional supp   -replete K+ 8. 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  9.Acute on chronic CKD: Monitor with serial checks. Baseline serum creatinine at 1.43 and serum creatinine trending back down.  8/31- Cr up to 1.57- will encourage fluids and d/w pt if she wants IVFs or to wait until tomorrow/based on labs. 10. Acute on chronic anemia: Improved to baseline post transfusion. Continue iron supplement.   -recheck labs today.   11.Chronic thrombocytopenia: platelets nowbelow 50. -monitor serially 12.Metastatic lung cancer: Continue Tagrisso 13. Chronic right pleural effusion: Plans for intervention in the future--?Pleurex v/s VATS 14. Chronic diastolic CHF,HTN: Compensated--check daily weights, heart healthy diet.  Resumed Imdur and Toprol XL today per cardiology recs   Filed Weights   12/06/18 0529 12/07/18 0442 12/08/18 0546  Weight: 55.2 kg 56.5 kg 54.9 kg     15. Constipation:  scheduled senna at bedtime.  -still no bm since admit  -sorbitol today  -fleet enema  8/31- LBM yesterday- is documented   16. Syncopal episode?  -?vasovagal event while trying to defecate  -check orthostatic vs  -re-check bmet/cbc today  -check EKG  -  encourage fluids   LOS: 3 days A FACE TO FACE EVALUATION WAS PERFORMED  Charlii Yost 12/08/2018, 10:17 AM

## 2018-12-08 NOTE — Care Management (Signed)
Central Individual Statement of Services  Patient Name:  Stacy Cook  Date:  12/08/2018  Welcome to the [facility].  Our goal is to provide you with an individualized program based on your diagnosis and situation, designed to meet your specific needs.  With this comprehensive rehabilitation program, you will be expected to participate in at least 3 hours of rehabilitation therapies Monday-Friday, with modified therapy programming on the weekends.  Your rehabilitation program will include the following services:  Physical Therapy (PT), Occupational Therapy (OT), Speech Therapy (ST), 24 hour per day rehabilitation nursing, Therapeutic Recreaction (TR), Neuropsychology, Case Management (Social Worker), Rehabilitation Medicine, Nutrition Services and Pharmacy Services  Weekly team conferences will be held on Tuesdays to discuss your progress.  Your Social Worker will talk with you frequently to get your input and to update you on team discussions.  Team conferences with you and your family in attendance may also be held.  Expected length of stay: 14-16 days   Overall anticipated outcome: minimal assistance  Depending on your progress and recovery, your program may change. Your Social Worker will coordinate services and will keep you informed of any changes. Your Social Worker's name and contact numbers are listed  below.  The following services may also be recommended but are not provided by the [facility] will be made to provide these services after discharge if needed.  Arrangements include referral to agencies that provide these services.  Your insurance has been verified to be:  [facility] Medicare Your primary doctor is:  Caryl Comes  Pertinent information will be shared with your doctor and your insurance  company.  Social Worker:  [place], [place] or ([phone]   Information discussed with and copy given to patient by: Lennart Pall, 12/08/2018, 11:04 AM
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
## 2018-12-08 NOTE — Progress Notes (Signed)
Physical Therapy Session Note  Patient Details  Name: Stacy Cook MRN: [id number] Date of Birth: [birth year]  Today's Date: 12/08/2018 PT Individual Time: 8413-2440 PT Individual Time Calculation (min): 45 min   Short Term Goals: Week 1:  PT Short Term Goal 1 (Week 1): Pt will perform bed mobility with min assist PT Short Term Goal 2 (Week 1): Pt will perform bed<>chair transfers with min assist PT Short Term Goal 3 (Week 1): Patient will ambulate at least 13ft using LRAD with mod assist PT Short Term Goal 4 (Week 1): Pt will initiate stair training  Skilled Therapeutic Interventions/Progress Updates:    Pt received seated in w/c in room, agreeable to PT session. No complaints of pain. Dependent transport via w/c to/from therapy gym for time conservation. Provided pt with "youth" RW for improved fit. Sit to stand with min A to RW with assist to place RUE in splint and v/c for safe hand placement during transfer. Ambulation 2 x 15 ft with youth RW with R hand splint, manual cues for R knee control. Pt exhibits ability to advance RLE but exhibits increased difficulty with onset of fatigue. Pt also exhibits some R knee hyperextension and R foot drop with gait. Pt reports poor proprioception of R limb and looks her feet during gait. Standing mini-squats 2 x 10 reps with RW and min A for R knee control. Seated HR 65, SpO2 98%, BP 148/45 (MAP 73) which is normal BP for patient. Pt has no instances of dizziness during therapy session. Stand pivot transfer w/c to bed with min A. Sit to supine mod A for BLE management. Pt left semi-reclined in bed with needs in reach at end of session.  Therapy Documentation Precautions:  Precautions Precautions: Fall Precaution Comments: right hemiparesis Restrictions Weight Bearing Restrictions: No    Therapy/Group: Individual Therapy   Excell Seltzer, PT, DPT  12/08/2018, 2:06 PM

## 2018-12-08 NOTE — Progress Notes (Signed)
Inpatient Rehabilitation  Patient information reviewed and entered into eRehab system by Vernal Hritz M. Kailon Treese, M.A., CCC/SLP, PPS Coordinator.  Information including medical coding, functional ability and quality indicators will be reviewed and updated through discharge.    

## 2018-12-08 NOTE — IPOC Note (Signed)
Overall Plan of Care [facility]) Patient Details Name: Stacy Cook MRN: [id number] DOB: 07/02/1935  Admitting Diagnosis: <principal problem not specified>  Hospital Problems: Active Problems:   Embolic stroke ([place])   Anemia in stage 3 chronic kidney disease ([place])   Acute renal failure ([place])     Functional Problem List: Nursing Bowel, Endurance, Medication Management, Pain, Safety  PT Balance, Safety, Sensory, Endurance, Motor, Pain, Edema  OT Balance, Sensory, Motor  SLP    TR         Basic ADL's: OT Grooming, Bathing, Dressing, Toileting, Eating     Advanced  ADL's: OT Simple Meal Preparation     Transfers: PT Bed Mobility, Bed to Chair, Car, Manufacturing systems engineer, Metallurgist: PT Ambulation, Emergency planning/management officer, Stairs     Additional Impairments: OT Fuctional Use of Upper Extremity  SLP Communication expression    TR      Anticipated Outcomes Item Anticipated Outcome  Self Feeding modified independent  Swallowing      Basic self-care  supervision to min assist  Toileting  min assist   Bathroom Transfers min assist  Bowel/Bladder  manage bowel with mod I assist  Transfers  CGA  Locomotion  CGA using LRAD  Communication  mod I  Cognition     Pain  Pain at or below level 4  Safety/Judgment  maintain safety wtih cues/reminders   Therapy Plan: PT Intensity: Minimum of 1-2 x/day ,45 to 90 minutes PT Frequency: 5 out of 7 days PT Duration Estimated Length of Stay: 2-2.5 weeks OT Intensity: Minimum of 1-2 x/day, 45 to 90 minutes OT Frequency: 5 out of 7 days OT Duration/Estimated Length of Stay: 14-16 days SLP Intensity: Minumum of 1-2 x/day, 30 to 90 minutes SLP Frequency: 1 to 3 out of 7 days SLP Duration/Estimated Length of Stay: 10-14 days   Due to the current state of emergency, patients may not be receiving their 3-hours of Medicare-mandated therapy.   Team Interventions: Nursing Interventions Patient/Family Education,  Bowel Management, Pain Management, Medication Management, Discharge Planning  PT interventions Ambulation/gait training, Community reintegration, DME/adaptive equipment instruction, Neuromuscular re-education, Psychosocial support, Stair training, UE/LE Strength taining/ROM, Wheelchair propulsion/positioning, Training and development officer, Discharge planning, Functional electrical stimulation, Pain management, Skin care/wound management, Therapeutic Activities, UE/LE Coordination activities, Cognitive remediation/compensation, Disease management/prevention, Functional mobility training, Patient/family education, Splinting/orthotics, Therapeutic Exercise, Visual/perceptual remediation/compensation  OT Interventions Balance/vestibular training, Discharge planning, Functional electrical stimulation, Pain management, Self Care/advanced ADL retraining, Therapeutic Activities, UE/LE Coordination activities, Therapeutic Exercise, Patient/family education, Functional mobility training, Disease mangement/prevention, Community reintegration, Engineer, drilling, Neuromuscular re-education, UE/LE Strength taining/ROM, Splinting/orthotics, Wheelchair propulsion/positioning  SLP Interventions Cueing hierarchy, Internal/external aids, Speech/Language facilitation, Functional tasks  TR Interventions    SW/CM Interventions Discharge Planning, Psychosocial Support, Patient/Family Education   Barriers to Discharge MD  Medical stability, Home enviroment access/loayout, Neurogenic bowel and bladder, Weight, Medication compliance and thrombocytopenia, and acute drop in GFR  Nursing      PT Inaccessible home environment, Home environment access/layout, Lack of/limited family support    OT Decreased caregiver support Will need 24 hour supervision/assist  SLP      SW       Team Discharge Planning: Destination: PT-Home ,OT- Home , SLP-Home Projected Follow-up: PT-Home health PT, 24 hour  supervision/assistance, OT-  24 hour supervision/assistance, SLP-None Projected Equipment Needs: PT-To be determined, OT- 3 in 1 bedside comode, Tub/shower bench, SLP-None recommended by SLP Equipment Details: PT- , OT-  Patient/family involved in discharge planning: PT-  Patient,  OT-Patient, SLP-Patient  MD ELOS: 10-14 days Medical Rehab Prognosis:  Good Assessment: Pt is an 83 yr old female with hx of CKD Stage III with acute renal impairment, metastatic lung CA, syncope, and recent embolic stroke on DAPT- she also has 1st degrees AV block and a recent syncopal event while having BM. Also has hx of Diastolic heart failure/HTN   See Team Conference Notes for weekly updates to the plan of care

## 2018-12-08 NOTE — Progress Notes (Signed)
  [facility]   MULTIDISCIPLINARY HEART VALVE TEAM  Patient had recent TAVR c/b acute CVA. Now in inpatient rehab for therapy. Per notes, patient had a possible syncopal episode while using the bedside commode over the weekend. The patient has associated facial trama from the fall. In talking to the patient, this sounds like it could have been true syncope. She was sitting there and the next thing she knew she woke up on the floor. Other DDx is vasovagal syncope in the setting of a BM. Given the fact that she had a new LBBB after her TAVR last week and now ECG shows both 1st degree AV block and LBBB, will place a Zio patch to monitor for HAVB. Dr. Naaman Plummer has appropriately stopped her Toprol XL.  Angelena Form PA-C  [facility]  [phone]

## 2018-12-09 ENCOUNTER — Inpatient Hospital Stay (HOSPITAL_COMMUNITY): Payer: Medicare Other

## 2018-12-09 ENCOUNTER — Encounter (HOSPITAL_COMMUNITY): Payer: Medicare Other | Admitting: Psychology

## 2018-12-09 ENCOUNTER — Ambulatory Visit (HOSPITAL_COMMUNITY): Admit: 2018-12-09 | Payer: Medicare Other | Admitting: Internal Medicine

## 2018-12-09 ENCOUNTER — Telehealth: Payer: Self-pay | Admitting: Physician Assistant

## 2018-12-09 ENCOUNTER — Inpatient Hospital Stay (HOSPITAL_COMMUNITY): Payer: Medicare Other | Admitting: Physical Therapy

## 2018-12-09 ENCOUNTER — Encounter (HOSPITAL_COMMUNITY)
Admission: AD | Disposition: A | Discharge status: Rehabilitation Facility | Payer: Self-pay | Source: Intra-hospital | Attending: Internal Medicine

## 2018-12-09 ENCOUNTER — Inpatient Hospital Stay: Payer: Medicare Other

## 2018-12-09 ENCOUNTER — Inpatient Hospital Stay (HOSPITAL_COMMUNITY)
Admission: AD | Admit: 2018-12-09 | Discharge: 2018-12-12 | DRG: 229 | Disposition: A | Discharge status: Rehabilitation Facility | Payer: Medicare Other | Source: Intra-hospital | Attending: Internal Medicine | Admitting: Internal Medicine

## 2018-12-09 ENCOUNTER — Inpatient Hospital Stay (HOSPITAL_COMMUNITY): Payer: Medicare Other | Admitting: Occupational Therapy

## 2018-12-09 ENCOUNTER — Inpatient Hospital Stay (HOSPITAL_COMMUNITY): Payer: Medicare Other | Admitting: Speech Pathology

## 2018-12-09 ENCOUNTER — Encounter (HOSPITAL_COMMUNITY): Payer: Self-pay

## 2018-12-09 DIAGNOSIS — I442 Atrioventricular block, complete: Secondary | ICD-10-CM

## 2018-12-09 DIAGNOSIS — N183 Chronic kidney disease, stage 3 (moderate): Secondary | ICD-10-CM | POA: Diagnosis present

## 2018-12-09 DIAGNOSIS — I469 Cardiac arrest, cause unspecified: Secondary | ICD-10-CM | POA: Diagnosis present

## 2018-12-09 DIAGNOSIS — Z7902 Long term (current) use of antithrombotics/antiplatelets: Secondary | ICD-10-CM

## 2018-12-09 DIAGNOSIS — E1122 Type 2 diabetes mellitus with diabetic chronic kidney disease: Secondary | ICD-10-CM

## 2018-12-09 DIAGNOSIS — Z950 Presence of cardiac pacemaker: Secondary | ICD-10-CM

## 2018-12-09 DIAGNOSIS — F411 Generalized anxiety disorder: Secondary | ICD-10-CM

## 2018-12-09 DIAGNOSIS — I69351 Hemiplegia and hemiparesis following cerebral infarction affecting right dominant side: Secondary | ICD-10-CM

## 2018-12-09 DIAGNOSIS — M810 Age-related osteoporosis without current pathological fracture: Secondary | ICD-10-CM | POA: Diagnosis present

## 2018-12-09 DIAGNOSIS — R569 Unspecified convulsions: Secondary | ICD-10-CM | POA: Diagnosis present

## 2018-12-09 DIAGNOSIS — I350 Nonrheumatic aortic (valve) stenosis: Secondary | ICD-10-CM | POA: Diagnosis present

## 2018-12-09 DIAGNOSIS — E875 Hyperkalemia: Secondary | ICD-10-CM | POA: Diagnosis present

## 2018-12-09 DIAGNOSIS — I130 Hypertensive heart and chronic kidney disease with heart failure and stage 1 through stage 4 chronic kidney disease, or unspecified chronic kidney disease: Secondary | ICD-10-CM | POA: Diagnosis present

## 2018-12-09 DIAGNOSIS — G40901 Epilepsy, unspecified, not intractable, with status epilepticus: Secondary | ICD-10-CM

## 2018-12-09 DIAGNOSIS — Z9861 Coronary angioplasty status: Secondary | ICD-10-CM

## 2018-12-09 DIAGNOSIS — I5032 Chronic diastolic (congestive) heart failure: Secondary | ICD-10-CM | POA: Diagnosis present

## 2018-12-09 DIAGNOSIS — Z887 Allergy status to serum and vaccine status: Secondary | ICD-10-CM

## 2018-12-09 DIAGNOSIS — D509 Iron deficiency anemia, unspecified: Secondary | ICD-10-CM | POA: Diagnosis present

## 2018-12-09 DIAGNOSIS — R001 Bradycardia, unspecified: Secondary | ICD-10-CM | POA: Diagnosis present

## 2018-12-09 DIAGNOSIS — K5900 Constipation, unspecified: Secondary | ICD-10-CM | POA: Diagnosis present

## 2018-12-09 DIAGNOSIS — Z952 Presence of prosthetic heart valve: Secondary | ICD-10-CM

## 2018-12-09 DIAGNOSIS — C3431 Malignant neoplasm of lower lobe, right bronchus or lung: Secondary | ICD-10-CM | POA: Diagnosis present

## 2018-12-09 DIAGNOSIS — D696 Thrombocytopenia, unspecified: Secondary | ICD-10-CM | POA: Diagnosis present

## 2018-12-09 DIAGNOSIS — Z006 Encounter for examination for normal comparison and control in clinical research program: Secondary | ICD-10-CM

## 2018-12-09 DIAGNOSIS — I2510 Atherosclerotic heart disease of native coronary artery without angina pectoris: Secondary | ICD-10-CM

## 2018-12-09 DIAGNOSIS — K219 Gastro-esophageal reflux disease without esophagitis: Secondary | ICD-10-CM | POA: Diagnosis present

## 2018-12-09 DIAGNOSIS — Z7982 Long term (current) use of aspirin: Secondary | ICD-10-CM

## 2018-12-09 DIAGNOSIS — D508 Other iron deficiency anemias: Secondary | ICD-10-CM | POA: Diagnosis present

## 2018-12-09 DIAGNOSIS — Z79899 Other long term (current) drug therapy: Secondary | ICD-10-CM

## 2018-12-09 DIAGNOSIS — J910 Malignant pleural effusion: Secondary | ICD-10-CM | POA: Diagnosis present

## 2018-12-09 DIAGNOSIS — I63439 Cerebral infarction due to embolism of unspecified posterior cerebral artery: Secondary | ICD-10-CM

## 2018-12-09 DIAGNOSIS — E538 Deficiency of other specified B group vitamins: Secondary | ICD-10-CM | POA: Diagnosis present

## 2018-12-09 DIAGNOSIS — I639 Cerebral infarction, unspecified: Secondary | ICD-10-CM | POA: Diagnosis present

## 2018-12-09 DIAGNOSIS — E119 Type 2 diabetes mellitus without complications: Secondary | ICD-10-CM

## 2018-12-09 DIAGNOSIS — Z955 Presence of coronary angioplasty implant and graft: Secondary | ICD-10-CM

## 2018-12-09 DIAGNOSIS — I10 Essential (primary) hypertension: Secondary | ICD-10-CM | POA: Diagnosis present

## 2018-12-09 HISTORY — DX: Presence of cardiac pacemaker: Z95.0

## 2018-12-09 HISTORY — PX: INSERT / REPLACE / REMOVE PACEMAKER: SUR710

## 2018-12-09 HISTORY — PX: PACEMAKER LEADLESS INSERTION: EP1219

## 2018-12-09 HISTORY — DX: Cardiac arrest, cause unspecified: I46.9

## 2018-12-09 LAB — CBC WITH DIFFERENTIAL/PLATELET
Abs Immature Granulocytes: 0.01 10*3/uL (ref 0.00–0.07)
Basophils Absolute: 0 10*3/uL (ref 0.0–0.1)
Basophils Relative: 0 %
Eosinophils Absolute: 0.1 10*3/uL (ref 0.0–0.5)
Eosinophils Relative: 4 %
HCT: 28.1 % — ABNORMAL LOW (ref 36.0–46.0)
Hemoglobin: 9.2 g/dL — ABNORMAL LOW (ref 12.0–15.0)
Immature Granulocytes: 0 %
Lymphocytes Relative: 24 %
Lymphs Abs: 0.7 10*3/uL (ref 0.7–4.0)
MCH: 30.1 pg (ref 26.0–34.0)
MCHC: 32.7 g/dL (ref 30.0–36.0)
MCV: 91.8 fL (ref 80.0–100.0)
Monocytes Absolute: 0.2 10*3/uL (ref 0.1–1.0)
Monocytes Relative: 7 %
Neutro Abs: 2 10*3/uL (ref 1.7–7.7)
Neutrophils Relative %: 65 %
Platelets: 41 10*3/uL — ABNORMAL LOW (ref 150–400)
RBC: 3.06 MIL/uL — ABNORMAL LOW (ref 3.87–5.11)
RDW: 12.9 % (ref 11.5–15.5)
WBC: 3.1 10*3/uL — ABNORMAL LOW (ref 4.0–10.5)
nRBC: 0 % (ref 0.0–0.2)

## 2018-12-09 LAB — BASIC METABOLIC PANEL
Anion gap: 6 (ref 5–15)
Anion gap: 7 (ref 5–15)
BUN: 28 mg/dL — ABNORMAL HIGH (ref 8–23)
BUN: 31 mg/dL — ABNORMAL HIGH (ref 8–23)
CO2: 21 mmol/L — ABNORMAL LOW (ref 22–32)
CO2: 20 mmol/L — ABNORMAL LOW (ref 22–32)
Calcium: 8.5 mg/dL — ABNORMAL LOW (ref 8.9–10.3)
Calcium: 8.9 mg/dL (ref 8.9–10.3)
Chloride: 115 mmol/L — ABNORMAL HIGH (ref 98–111)
Chloride: 112 mmol/L — ABNORMAL HIGH (ref 98–111)
Creatinine, Ser: 1.49 mg/dL — ABNORMAL HIGH (ref 0.44–1.00)
Creatinine, Ser: 1.49 mg/dL — ABNORMAL HIGH (ref 0.44–1.00)
GFR calc Af Amer: 38 mL/min — ABNORMAL LOW (ref >60)
GFR calc Af Amer: 38 mL/min — ABNORMAL LOW (ref >60)
GFR calc non Af Amer: 33 mL/min — ABNORMAL LOW (ref >60)
GFR calc non Af Amer: 33 mL/min — ABNORMAL LOW (ref >60)
Glucose, Bld: 96 mg/dL (ref 70–99)
Glucose, Bld: 100 mg/dL — ABNORMAL HIGH (ref 70–99)
Potassium: 5.2 mmol/L — ABNORMAL HIGH (ref 3.5–5.1)
Potassium: 5 mmol/L (ref 3.5–5.1)
Sodium: 142 mmol/L (ref 135–145)
Sodium: 139 mmol/L (ref 135–145)

## 2018-12-09 LAB — GLUCOSE, CAPILLARY
Glucose-Capillary: 91 mg/dL (ref 70–99)
Glucose-Capillary: 124 mg/dL — ABNORMAL HIGH (ref 70–99)
Glucose-Capillary: 122 mg/dL — ABNORMAL HIGH (ref 70–99)
Glucose-Capillary: 132 mg/dL — ABNORMAL HIGH (ref 70–99)

## 2018-12-09 LAB — HEMOGLOBIN A1C
Hgb A1c MFr Bld: 5.3 % (ref 4.8–5.6)
Mean Plasma Glucose: 105.41 mg/dL

## 2018-12-09 SURGERY — PACEMAKER LEADLESS INSERTION

## 2018-12-09 MED ORDER — ONDANSETRON HCL 4 MG/2ML IJ SOLN
INTRAMUSCULAR | Status: AC
  Administered 2018-12-09: 4 mg via INTRAVENOUS
  Filled 2018-12-09: qty 2

## 2018-12-09 MED ORDER — LIDOCAINE HCL (PF) 1 % IJ SOLN
INTRAMUSCULAR | Status: DC | PRN
  Administered 2018-12-09: 30 mL

## 2018-12-09 MED ORDER — LEVETIRACETAM 500 MG PO TABS
500.0000 mg | ORAL_TABLET | Freq: Two times a day (BID) | ORAL | Status: DC
  Administered 2018-12-10 – 2018-12-12 (×6): 500 mg via ORAL
  Filled 2018-12-09 (×6): qty 1

## 2018-12-09 MED ORDER — ACETAMINOPHEN 325 MG PO TABS: 650.0000 mg | ORAL_TABLET | ORAL | Status: DC | PRN

## 2018-12-09 MED ORDER — OXYCODONE HCL 5 MG PO TABS: 5.0000 mg | ORAL_TABLET | ORAL | Status: DC | PRN

## 2018-12-09 MED ORDER — LORAZEPAM 2 MG/ML IJ SOLN
1.0000 mg | Freq: Once | INTRAMUSCULAR | Status: DC
  Administered 2018-12-09: 1 mg via INTRAVENOUS

## 2018-12-09 MED ORDER — MIDAZOLAM HCL 5 MG/5ML IJ SOLN
INTRAMUSCULAR | Status: AC
  Filled 2018-12-09: qty 5

## 2018-12-09 MED ORDER — FENTANYL CITRATE (PF) 100 MCG/2ML IJ SOLN
INTRAMUSCULAR | Status: AC
  Filled 2018-12-09: qty 2

## 2018-12-09 MED ORDER — CEFAZOLIN SODIUM-DEXTROSE 2-4 GM/100ML-% IV SOLN
2.0000 g | INTRAVENOUS | Status: AC
  Administered 2018-12-09: 2 g via INTRAVENOUS

## 2018-12-09 MED ORDER — SODIUM CHLORIDE 0.9% FLUSH
3.0000 mL | Freq: Two times a day (BID) | INTRAVENOUS | Status: DC
  Administered 2018-12-10 – 2018-12-12 (×5): 3 mL via INTRAVENOUS

## 2018-12-09 MED ORDER — POLYETHYLENE GLYCOL 3350 17 G PO PACK: 17.0000 g | PACK | Freq: Every day | ORAL | Status: DC | PRN

## 2018-12-09 MED ORDER — ONDANSETRON HCL 4 MG/2ML IJ SOLN
4.0000 mg | Freq: Once | INTRAMUSCULAR | Status: AC
  Administered 2018-12-09: 15:00:00 4 mg via INTRAVENOUS

## 2018-12-09 MED ORDER — HEPARIN SODIUM (PORCINE) 1000 UNIT/ML IJ SOLN
INTRAMUSCULAR | Status: AC
  Filled 2018-12-09: qty 1

## 2018-12-09 MED ORDER — LORAZEPAM 2 MG/ML IJ SOLN
INTRAMUSCULAR | Status: AC
  Administered 2018-12-09: 1 mg via INTRAVENOUS
  Filled 2018-12-09: qty 1

## 2018-12-09 MED ORDER — OSIMERTINIB MESYLATE 80 MG PO TABS
80.0000 mg | ORAL_TABLET | Freq: Every evening | ORAL | Status: DC
  Administered 2018-12-10 – 2018-12-11 (×2): 80 mg via ORAL
  Filled 2018-12-09 (×3): qty 1

## 2018-12-09 MED ORDER — SODIUM CHLORIDE 0.9% FLUSH: 3.0000 mL | INTRAVENOUS | Status: DC | PRN

## 2018-12-09 MED ORDER — SODIUM CHLORIDE 0.45 % IV SOLN: INTRAVENOUS | Status: DC

## 2018-12-09 MED ORDER — HEPARIN (PORCINE) IN NACL 1000-0.9 UT/500ML-% IV SOLN
INTRAVENOUS | Status: AC
  Filled 2018-12-09: qty 500

## 2018-12-09 MED ORDER — ONDANSETRON HCL 4 MG/2ML IJ SOLN: 4.0000 mg | Freq: Four times a day (QID) | INTRAMUSCULAR | Status: DC | PRN

## 2018-12-09 MED ORDER — LEVETIRACETAM 500 MG PO TABS: 500.0000 mg | ORAL_TABLET | Freq: Two times a day (BID) | ORAL | Status: DC

## 2018-12-09 MED ORDER — CEFAZOLIN SODIUM-DEXTROSE 2-4 GM/100ML-% IV SOLN
INTRAVENOUS | Status: AC
  Filled 2018-12-09: qty 100

## 2018-12-09 MED ORDER — INSULIN ASPART 100 UNIT/ML ~~LOC~~ SOLN
0.0000 [IU] | Freq: Three times a day (TID) | SUBCUTANEOUS | Status: DC
  Administered 2018-12-12: 18:00:00 2 [IU] via SUBCUTANEOUS

## 2018-12-09 MED ORDER — LEVETIRACETAM IN NACL 500 MG/100ML IV SOLN
500.0000 mg | Freq: Once | INTRAVENOUS | Status: DC
  Filled 2018-12-09: qty 100

## 2018-12-09 MED ORDER — LIDOCAINE HCL 1 % IJ SOLN
INTRAMUSCULAR | Status: AC
  Filled 2018-12-09: qty 60

## 2018-12-09 MED ORDER — IOHEXOL 350 MG/ML SOLN
INTRAVENOUS | Status: DC | PRN
  Administered 2018-12-09: 10 mL via INTRAVENOUS

## 2018-12-09 MED ORDER — LORAZEPAM 2 MG/ML IJ SOLN
1.0000 mg | Freq: Once | INTRAMUSCULAR | Status: AC
  Administered 2018-12-09: 14:00:00 1 mg via INTRAVENOUS

## 2018-12-09 MED ORDER — LOSARTAN POTASSIUM 50 MG PO TABS: 100.0000 mg | ORAL_TABLET | Freq: Every day | ORAL | Status: DC

## 2018-12-09 MED ORDER — MIDAZOLAM HCL 5 MG/5ML IJ SOLN
INTRAMUSCULAR | Status: DC | PRN
  Administered 2018-12-09: 1 mg via INTRAVENOUS

## 2018-12-09 MED ORDER — FENTANYL CITRATE (PF) 100 MCG/2ML IJ SOLN
INTRAMUSCULAR | Status: DC | PRN
  Administered 2018-12-09: 12.5 ug via INTRAVENOUS

## 2018-12-09 MED ORDER — LEVETIRACETAM IN NACL 1000 MG/100ML IV SOLN
1000.0000 mg | Freq: Once | INTRAVENOUS | Status: AC
  Administered 2018-12-09: 1000 mg via INTRAVENOUS
  Filled 2018-12-09: qty 100

## 2018-12-09 MED ORDER — SODIUM CHLORIDE 0.9 % IV SOLN: 250.0000 mL | INTRAVENOUS | Status: DC | PRN

## 2018-12-09 SURGICAL SUPPLY — 13 items
BAG SNAP BAND KOVER 36X36 (MISCELLANEOUS) ×3 IMPLANT
CABLE SURGICAL S-101-97-12 (CABLE) ×3 IMPLANT
CATH JOSEPH QUAD ALLRED 6F REP (CATHETERS) ×3 IMPLANT
KIT DILATOR VASC 18G NDL (KITS) ×3 IMPLANT
MICRA AV TRANSCATH PACING SYS (Pacemaker) ×3 IMPLANT
MICRA INTRODUCER SHEATH (SHEATH) ×3
PAD PRO RADIOLUCENT 2001M-C (PAD) ×3 IMPLANT
SHEATH INTRODUCER MICRA (SHEATH) ×1 IMPLANT
SHEATH PINNACLE 6F 10CM (SHEATH) ×3 IMPLANT
SHEATH PINNACLE 8F 10CM (SHEATH) ×3 IMPLANT
SYSTEM PACING TRNSCTH AV MICRA (Pacemaker) ×1 IMPLANT
TRAY PACEMAKER INSERTION (PACKS) ×3 IMPLANT
WIRE AMPLATZ SS-J .035X180CM (WIRE) ×3 IMPLANT

## 2018-12-09 NOTE — Progress Notes (Signed)
Physical Therapy Session Note  Patient Details  Name: Stacy Cook MRN: [id number] Date of Birth: [birth year]  Today's Date: 12/09/2018 PT Missed Time: 75 Minutes Missed Time Reason: MD hold (Comment)(seizures)   Pt on medical hold 2/2 seizures and being transferred off unit. Missed 75 min of skilled PT.   Klye Besecker [name] 12/09/2018, 3:59 PM
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
## 2018-12-09 NOTE — Interval H&P Note (Signed)
History and Physical Interval Note:  12/09/2018 4:27 PM  Stacy Cook  has presented today for surgery, with the diagnosis of heart block.  The various methods of treatment have been discussed with the patient and family. After consideration of risks, benefits and other options for treatment, the patient has consented to  Procedure(s): PACEMAKER LEADLESS INSERTION (N/A) as a surgical intervention.  The patient's history has been reviewed, patient examined, no change in status, stable for surgery.  I have reviewed the patient's chart and labs.  Questions were answered to the patient's satisfaction.     [name]

## 2018-12-09 NOTE — Telephone Encounter (Signed)
° °  [facility] calling with abnormal EKG result

## 2018-12-09 NOTE — Progress Notes (Signed)
Occupational Therapy Session Note  Patient Details  Name: Stacy Cook MRN: [id number] Date of Birth: [birth year]  Today's Date: 12/09/2018 OT Individual Time: 1100-1118 OT Individual Time Calculation (min): 18 min  and Today's Date: 12/09/2018 OT Missed Time: 42 Minutes(Pt with possible syncopal episode) Missed Time Reason: Patient ill (comment)  Short Term Goals: Week 1:  OT Short Term Goal 1 (Week 1): Pt will complete UB dressing to donn a pullover shirt with min assist following hemidressing techniques. OT Short Term Goal 2 (Week 1): Pt will complete bathing sit to stand with min assist. OT Short Term Goal 3 (Week 1): Pt will peform toilet transfer with min assist stand pivot OT Short Term Goal 4 (Week 1): Pt will use the RUE as a gross assist with self care and grooming tasks with mod facilitation. OT Short Term Goal 5 (Week 1): Pt will complete LB dressing with mod assist for all aspects sit to stand.  Skilled Therapeutic Interventions/Progress Updates:    Pt greeted with nursing in the room after environmental services witnessed shaking episode. Nursing took pt's BP and requested pt return to bed. Min A stand-pivot back to bed. Pt with no further episodes while OT was in the room. OT provided pt with foam block to keep working on R hand grasp while awaiting PA. PA entered room and pt left with bed alarm on and needs met.   Therapy Documentation Precautions:  Precautions Precautions: Fall Precaution Comments: right hemiparesis Restrictions Weight Bearing Restrictions: No General: General OT Amount of Missed Time: 42 Minutes(Pt with possible syncopal episode) Pain: Pain Assessment Pain Scale: 0-10 Pain Score: 0-No pain Faces Pain Scale: No hurt   Therapy/Group: Individual Therapy  Valma Cava 12/09/2018, 11:22 AM
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
## 2018-12-09 NOTE — Telephone Encounter (Signed)
Stacy Cook with i[employer] rhythm alert. Today @ 1057 pt had 10.8 sec pause. Still awaiting tracing from iR[employer].   Called pt to discuss. Pt states she blacked out. Episode was witnessed where the episode was described as seizure activity. Pt states she blacked out and was unsure of what happened. She did have a sharp pain in her neck directly before she had her episode.  She denied falling as episode happened when she was in the chair.

## 2018-12-09 NOTE — Progress Notes (Signed)
Site area: rt groin 3-way stop cock  removed and suture removed. Site Prior to Removal:  Level 0 Pressure Applied For: 40 minutes Manual:   yes Patient Status During Pull:  stable Post Pull Site:  Level 0 Post Pull Instructions Given:  yes Post Pull Pulses Present: rt dp palpable Dressing Applied:  Gauze and tegaderm Bedrest begins @  Comments:

## 2018-12-09 NOTE — Progress Notes (Signed)
Left groin oozing. Dressing removed. Hematoma returned above puncture site. Manual pressure to left groin for   10 minutes. Hematoma softened. Left groin level 1. Bedrest started at 44

## 2018-12-09 NOTE — Significant Event (Signed)
Rapid Response Event Note  Overview: Time Called: 0964 Arrival Time: 1418 Event Type: Neurologic  Initial Focused Assessment/Interventions: Patient on Rehab sp CVA sp TAVR. Earlier today had an "unresponsive" episode Shortly before 2pm had seizure activity of Left arm and leg and gaze to the right and head turn to the right per PA.  1mg  Ativan given IV x 2.    Upon my arrival patient is responsive and able to follow commands.  No seizure activity noted.  She is unable to move her right arm and minimal movement of right leg (unchanged since CVA)  1g keppra given IV Placed on 2L [place]  She vomited bilious contents, oral suction and turned on right while vomiting.  BP 179/57  HR 78  RR 17  O2 sat 100% Patient becoming sleepy Placed on [place] for cardiac monitor 12 lead EKG done  EEG  done  Cardiology at bedside,  Device interrogated and discovered patient had had multiple pauses 9-13 sec each.  Plan emergently to cath lab for permanent pacemaker.  Dr Rayann Heman at bedside spoke with patient and her son  Transported to cath lab  Plan of Care (if not transferred):  Event Summary: Name of Physician Notified: Dr Naaman Plummer at bedside,  Reesa Chew PA at bedside at    Name of Consulting Physician Notified: Dr Leonel Ramsay at 1354  Outcome: Transferred (Comment)(3W)  Event End Time: [place]  Raliegh Ip

## 2018-12-09 NOTE — Patient Care Conference (Signed)
Inpatient RehabilitationTeam Conference and Plan of Care Update Date: 12/09/2018   Time: 10:40 AM    Patient Name: Stacy Cook [facility]      Medical Record Number: [id number]  Date of Birth: 1936-02-15 Sex: Female         Room/Bed: 4W22C/4W22C-01 Payor Info: Payor: [employer] MEDICARE / Plan: [facility] MEDICARE / Product Type: *No Product type* /    Admitting Diagnosis: 2. ABI Team  LT. CVA due to embolism; 17-19days  Admit Date/Time:  12/05/2018  4:45 PM Admission Comments: No comment available   Primary Diagnosis:  <principal problem not specified> Principal Problem: <principal problem not specified>  Patient Active Problem List   Diagnosis Date Noted  . Anxiety state   . Anemia in stage 3 chronic kidney disease ([place])   . Acute renal failure ([place])   . Embolic stroke ([place]) 85/63/1497  . S/P TAVR (transcatheter aortic valve replacement) 12/02/2018  . Acute on chronic diastolic heart failure ([place]) 12/02/2018  . Anemia   . GERD (gastroesophageal reflux disease)   . Hypertension 02/14/2018  . Acquired iron deficiency anemia due to decreased absorption 09/09/2017  . Adenocarcinoma of gastric cardia ([place]) 04/10/2016  . Carotid stenosis 02/07/2016  . Primary malignant neoplasm of right lower lobe of lung ([place]) 12/02/2015  . B12 deficiency 03/24/2015  . CAD S/P percutaneous coronary angioplasty 03/24/2015  . Neuroendocrine tumor 03/24/2015  . Type 2 diabetes mellitus ([place]) 03/24/2015  . Thrombocytopenia ([place]) 11/10/2014  . Neuritis or radiculitis due to rupture of lumbar intervertebral disc 09/10/2014  . Nerve root inflammation 03/11/2014  . HLD (hyperlipidemia) 08/24/2013    Expected Discharge Date: Expected Discharge Date: 12/27/18  Team Members Present: Physician leading conference: Dr. Alger Simons Social Worker Present: Lennart Pall, LCSW Nurse Present: Ellison Carwin, LPN PT Present: Burnard Bunting, PT OT Present: Cherylynn Ridges, OT SLP Present: Weston Anna, SLP PPS  Coordinator present : Ileana Ladd, PT     Current Status/Progress Goal Weekly Team Focus  Medical   left frontal and pontine infarct with right hemiparesis. 1st deg AVB and LBB--being followed by cardiology. pain control. ?syncopal espisode over weekend  improve functional mobility  cardiac monitoring, volume mgt   Bowel/Bladder   cont bowel and bladder, lbm 9/1  maintain regular bowel pattern  assist with bowels as needed   Swallow/Nutrition/ Hydration             ADL's   Mod A overall  Supervision/CGA  R NMR, transfers, balance, self-care retraining   Mobility   min to mod assist overall for transfers, bed mobility, gait and stairs  CGA for transfers and gait; min assist stairs and car transfer  NMR for RUE/RLE, balance, transfers, gait progression, stairs, functional strengthening   Communication   Mod I  Mod I  Goal Met, D/C from SLP caseload   Safety/Cognition/ Behavioral Observations            Pain   no pain  pt to remain free from pain  assess for pain qshift and prn   Skin   two lower abdominal incisions, dry dressing on right clean and intact, dermabond on left CDI, skin tear to right forearm with steristrips and clear tegaderm that has old sanginuos drainage  keep dressings CDI and prevent infections  change dressings prn, encourage patient to turn frequently    Rehab Goals Patient on target to meet rehab goals: Yes *See Care Plan and progress notes for long and short-term goals.     Barriers to Discharge  Current Status/Progress Possible Resolutions Date Resolved   Physician    Medical stability        see medical progress notes      Nursing                  PT  Inaccessible home environment;Home environment access/layout;Lack of/limited family support                 OT Decreased caregiver support  Will need 24 hour supervision/assist             SLP                SW                Discharge Planning/Teaching Needs:  Pt to d/c home with family to  arrange 24/7 assistance.  Teaching to be planned closer to d/c.   Team Discussion:  Pt with ?syncopal vs vaso vagal responses - MD monitoring.cont b/b;  Two incisions healing well.  Mod assist overall with ADLs and min/ mod with gait and supervision - CGA goals. ST plans to d/c as she is at baseline.  Revisions to Treatment Plan:  NA    Continued Need for Acute Rehabilitation Level of Care: The patient requires daily medical management by a physician with specialized training in physical medicine and rehabilitation for the following conditions: Daily direction of a multidisciplinary physical rehabilitation program to ensure safe treatment while eliciting the highest outcome that is of practical value to the patient.: Yes Daily medical management of patient stability for increased activity during participation in an intensive rehabilitation regime.: Yes Daily analysis of laboratory values and/or radiology reports with any subsequent need for medication adjustment of medical intervention for : Neurological problems;Cardiac problems;Diabetes problems   I attest that I was present, lead the team conference, and concur with the assessment and plan of the team.   Lennart Pall 12/09/2018, 2:35 PM    Team conference was held via web/ teleconference due to [place] - 19

## 2018-12-09 NOTE — Progress Notes (Signed)
Stacy Cook PHYSICAL MEDICINE & REHABILITATION PROGRESS NOTE   Subjective/Complaints:   No new issues. Slept well. Denies dizziness today.   ROS: Patient denies fever, rash, sore throat, blurred vision, nausea, vomiting, diarrhea, cough, shortness of breath or chest pain, joint or back pain, headache, or mood change.     Objective:   No results found. Recent Labs    12/07/18 0837 12/09/18 0524  WBC 3.4* 3.1*  HGB 10.5* 9.2*  HCT 31.8* 28.1*  PLT 42* 41*   Recent Labs    12/08/18 0605 12/09/18 0524  NA 142 142  K 4.5 5.2*  CL 113* 115*  CO2 23 21*  GLUCOSE 98 96  BUN 28* 28*  CREATININE 1.57* 1.49*  CALCIUM 8.8* 8.5*    Intake/Output Summary (Last 24 hours) at 12/09/2018 0918 Last data filed at 12/09/2018 0825 Gross per 24 hour  Intake 1440.48 ml  Output -  Net 1440.48 ml     Physical Exam: Vital Signs Blood pressure (!) 147/51, pulse 62, temperature 98.3 F (36.8 C), temperature source Oral, resp. rate 19, height 5\' 1"  (1.549 m), weight 54.9 kg, SpO2 97 %. Constitutional: No distress . Vital signs reviewed. HEENT: EOMI, oral membranes moist Neck: supple Cardiovascular: reg/brady without murmur. No JVD    Respiratory: CTA Bilaterally without wheezes or rales. Normal effort    GI: BS +, non-tender, non-distended  Musculoskeletal:Normal range of motion.  General: No deformityor edema.  Neurological: She isalertand oriented to person, place, and time; stable Fair insight and awareness. Right C7. No nystagmus. RUE 0/5 except thumb and biceps which are trace, RLE 1 to 1+/5 HE, KE, 1/5 APF, trace ADF.---stable.  LUE and LLE 4+/5. No focal sensory findings. DTRs tr left and 1+ right. Skin: skin tear right wrist Psych- remains pleasant    Assessment/Plan: 1. Functional deficits secondary to left pontine/frontal infarcts which require 3+ hours per day of interdisciplinary therapy in a comprehensive inpatient rehab setting.  Physiatrist is providing  close team supervision and 24 hour management of active medical problems listed below.  Physiatrist and rehab team continue to assess barriers to discharge/monitor patient progress toward functional and medical goals  Care Tool:  Bathing    Body parts bathed by patient: Right arm, Chest, Abdomen, Right upper leg, Left upper leg, Face, Left lower leg(simulated)   Body parts bathed by helper: Left arm, Front perineal area, Buttocks, Right lower leg     Bathing assist Assist Level: Maximal Assistance - Patient 24 - 49%(hand over hand assit per OT eval )     Upper Body Dressing/Undressing Upper body dressing   What is the patient wearing?: Pull over shirt    Upper body assist Assist Level: Moderate Assistance - Patient 50 - 74%(simulated)    Lower Body Dressing/Undressing Lower body dressing      What is the patient wearing?: Hospital gown only, Underwear/pull up     Lower body assist Assist for lower body dressing: Total Assistance - Patient < 25%     Toileting Toileting    Toileting assist Assist for toileting: Moderate Assistance - Patient 50 - 74%     Transfers Chair/bed transfer  Transfers assist     Chair/bed transfer assist level: Minimal Assistance - Patient > 75%     Locomotion Ambulation   Ambulation assist   Ambulation activity did not occur: Safety/medical concerns(requires hallway rail to ambulate and was independent PTA)  Assist level: Minimal Assistance - Patient > 75% Assistive device: Walker-rolling Max distance: 61'  Walk 10 feet activity   Assist  Walk 10 feet activity did not occur: Safety/medical concerns(requires hallway rail to ambulate and was independent PTA)  Assist level: Minimal Assistance - Patient > 75% Assistive device: Walker-rolling   Walk 50 feet activity   Assist Walk 50 feet with 2 turns activity did not occur: Safety/medical concerns         Walk 150 feet activity   Assist Walk 150 feet activity did not  occur: Safety/medical concerns         Walk 10 feet on uneven surface  activity   Assist Walk 10 feet on uneven surfaces activity did not occur: Safety/medical concerns         Wheelchair     Assist Will patient use wheelchair at discharge?: Yes Type of Wheelchair: Manual    Wheelchair assist level: Set up assist, Supervision/Verbal cueing Max wheelchair distance: 185ft    Wheelchair 50 feet with 2 turns activity    Assist        Assist Level: Set up assist, Supervision/Verbal cueing   Wheelchair 150 feet activity     Assist      Assist Level: Set up assist, Supervision/Verbal cueing   Blood pressure (!) 147/51, pulse 62, temperature 98.3 F (36.8 C), temperature source Oral, resp. rate 19, height 5\' 1"  (1.549 m), weight 54.9 kg, SpO2 97 %.  Medical Problem List and Plan: 1.Functional deficits and right hemiparesissecondary to left pontine and frontal infarcts, likely embolic after TAVR --continue CIR therapies including PT, OT, and SLP  -team conference today     2. Antithrombotics: -DVT/anticoagulation:No heparin d/t thrombocytopenia -SCD's  -platelets stable at 41k today 9/1 -antiplatelet therapy: continue plavix and asa 3. Pain Management:tylenol  -tramadol q8 hrs prn (reduced dose) for more severe pain  -baclofen 5mg  q12 prn spasms (reduced dose)-appears to have helped 4. Mood:team to provide ego support. Appears motivated and positive -antipsychotic agents: n/a  -xanax prn for anxiety 5. Neuropsych: This patientiscapable of making decisions on herown behalf. 6. Skin/Wound Care:Monitor incisions for healing. 7. Fluids/Electrolytes/Nutrition: encourage PO    -nutritional supp   -hyperkalemia 7.8---ML kdur 8. 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  9.Acute on chronic CKD: Monitor with serial checks. Baseline serum creatinine at 1.43--overall stable *1.49 today), BUN a  little elevated  -continue to push po fluids for now---recheck Thursday.    10. Acute on chronic anemia: Improved to baseline post transfusion. Continue iron supplement.   -hgb down to 9.2 today---no signs of blood loss  -recheck Thursday.   11.Chronic thrombocytopenia: platelets nowbelow 50. -monitor serially 12.Metastatic lung cancer: Continue Tagrisso 13. Chronic right pleural effusion: Plans for intervention in the future--?Pleurex v/s VATS 14. Chronic diastolic CHF,HTN: Compensated--check daily weights, heart healthy diet.  Resumed Imdur  per cardiology recs  -metoprolol held, losartan resumed Filed Weights   12/08/18 1600 12/08/18 1937 12/09/18 0659  Weight: 54.6 kg 55 kg 54.9 kg     15. Constipation:  scheduled senna at bedtime.  LBM 8/30 after sorbitol   16. 1st AVB and LBBB  -holding metoprolol  -losartan resumed  -Zio patch placed by cardiology who is following---appreciate their help!   LOS: 4 days A FACE TO FACE EVALUATION WAS PERFORMED  Meredith Staggers 12/09/2018, 9:18 AM

## 2018-12-09 NOTE — Discharge Summary (Signed)
Physician Discharge Summary  Patient ID: Stacy Cook MRN: [id number] DOB/AGE: May 04, 1935 83 y.o.  Admit date: 12/05/2018 Discharge date: 12/09/2018  Discharge Diagnoses:  Principal Problem:   Seizures ([place]) Active Problems:   Thrombocytopenia ([place])   Primary malignant neoplasm of right lower lobe of lung ([place])   S/P TAVR (transcatheter aortic valve replacement)   Embolic stroke ([place])   Anemia in stage 3 chronic kidney disease (HCC)   Acute renal failure ([place])   Anxiety state   Heart block   Discharged Condition: Critical.   Significant Diagnostic Studies:  Labs:  Basic Metabolic Panel: Recent Labs  Lab 12/02/18 1449 12/02/18 1600 12/03/18 0223 12/04/18 0206 12/05/18 0334 12/07/18 0837 12/08/18 0605 12/09/18 0524 12/09/18 1123  NA 142 141 139 141 142 142 142 142 139  K 3.9 4.1 3.9 3.6 3.8 4.1 4.5 5.2* 5.0  CL  --  108 111 113* 111 107 113* 115* 112*  CO2  --   --  21* 19* 24 25 23  21* 20*  GLUCOSE 139* 127* 91 92 101* 94 98 96 100*  BUN  --  22 21 18 17 23  28* 28* 31*  CREATININE  --  1.50* 1.51* 1.45* 1.40* 1.32* 1.57* 1.49* 1.49*  CALCIUM  --   --  8.4* 8.4* 8.3* 8.9 8.8* 8.5* 8.9  MG  --   --  1.7  --   --   --   --   --   --     CBC: Recent Labs  Lab 12/06/18 0717 12/07/18 0837 12/09/18 0524  WBC 3.0* 3.4* 3.1*  NEUTROABS 2.0  --  2.0  HGB 10.1* 10.5* 9.2*  HCT 30.9* 31.8* 28.1*  MCV 90.6 91.1 91.8  PLT 42* 42* 41*    CBG: Recent Labs  Lab 12/09/18 1122 12/09/18 1417 12/09/18 1433 12/09/18 2213  GLUCAP 91 124* 122* 132*    Brief HPI:   Stacy Cook is a 83 y.o. 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  She was admitted on 12/02/2018 for TAVR by Dr. Aris Lot and Dr. Angelena Form.   Postprocedure patient was noted to have right-sided numbness that progressed to weakness and was found to have acute infarct left anterior pons and punctate areas of acute infarct in left frontal lobe.  Neurology felt stroke was likely embolic due to procedure and to continue DAPT.  She had acute on chronic anemia requiring 1 unit PRBC.  She continues to be limited by right-sided weakness and mild dysarthria.  Therapy ongoing and CIR was recommended due to functional deficits.   Hospital Course: Kenyana Husak was admitted to rehab 12/05/2018 for inpatient therapies to consist of PT, ST and OT at least three hours five days a week. Past admission physiatrist, therapy team and rehab RN have worked together to provide customized collaborative inpatient rehab.  Her blood pressures were monitored on 3 times daily basis and were noted to be labile.  Cozaar, Imdur and Toprol were resumed per cardiology recommendations. Therapy evaluation done revealing plus to mod assist for transfers and for mobility.  Heart rate fluctuated between 40s to 70s with activity and patient reported presyncopal symptoms.  She required mod assist with basic ADL tasks and exhibited mild dysarthria with decreased vocal intensity.  She did have  syncopal episode on 8/30 with fall of BSC and symptoms felt to be due to vasovagal symptoms.  Follow up labs showed acute on chronic renal failure and she was treated with IVF for dehydration due to poor po intake.  CBC showed stable thrombocytopenia with stable H/H. EKG showed first degree AVB and L-BBB therefore metoprolol was discontinued.  Cardiology was consulted and Zio patch placed for monitoring for further HB on 08/31 . She had a brief  episode of unresponsiveness on 9/1 am question due to orthostatic changes and cardiology was contacted to check monitor for any signs of arrhthymias.   Later that afternoon while being evaluated by neuropsychologist, she developed motor seizure affecting  left side with head turn to the right followed by brief episode of confusion. Neurology was consulted for input and recommended EEG for work up.   Keppra 1000 mg ordered but she continued to have motor activity left side requiring IV ativan 1 mg X 2 followed by unresponsiveness.   She was started on IVF and oxygen. CT head ordered for work up and to rule out bleed or acute event.  Cardiology had also noted 4 episodes of pauses --longest lasting about 13 seconds. Dr. Leonel Ramsay and Dr.Sheehan consulted to assist with patient care and cardiology recommended transfer to cardiac floor with plans for PPM due to AVB.    Medications at discharge: . aspirin EC  81 mg Oral Daily  . clopidogrel  75 mg Oral Daily  . ferrous sulfate  325 mg Oral Q breakfast  . isosorbide mononitrate  30 mg Oral Daily  . levETIRAcetam  500 mg Oral BID  . losartan  100 mg Oral Daily  . ondansetron      . ondansetron (ZOFRAN) IV  4 mg Intravenous Once  . osimertinib mesylate  80 mg Oral QPM  . pantoprazole  40 mg Oral Daily    Diet: Heart healthy   Discharge disposition: [facility] Not Defined   Signed: Bary Leriche 12/10/2018, 12:16 AM

## 2018-12-09 NOTE — Progress Notes (Signed)
Site area: left groin 3-way stop cock and suture removed and pressure held by [employer] Prior to Removal:  Level 1 hematoma that was expressed, preexisting faint bruising Pressure Applied For: 15 minutes Manual:   yes Patient Status During Pull:  stable Post Pull Site:  Level 0 Post Pull Instructions Given:  yes Post Pull Pulses Present: left dp palpable Dressing Applied:  Gauze and bruising Bedrest begins @  Comments:

## 2018-12-09 NOTE — Progress Notes (Signed)
Social Work Patient ID: Stacy Cook, female   DOB: [birth year], 83 y.o.   MRN: [id number]  Earlier had spoken with pt about team conference and targeted d/c date of 9/19 and supervision/ CGA goals.  Pt frustrated with medical issues and noted sz activity.  Had not yet spoken with family when pt had more sz activity and plan now to move to acute for medical management there.  Hope for pt to return to CIR when stable as she was very motivated and making good gains.  Family also committed to providing 24/7 support.  Romell Wolden, LCSW

## 2018-12-09 NOTE — Telephone Encounter (Signed)
I spoke to Dr Marlou Porch about the patient's monitor notification and he advised that the patient goes to the ED for further evaluation and most likely, pacemaker.  I called the patient and gave her this information.  She is presently in the hospital at rehab, but will go to the ED now.
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
## 2018-12-09 NOTE — Progress Notes (Signed)
Informed that patient has an unresponsive episode. Informed cardiology PA--no arrthymia alert noted but she will examine it further and follow up. Hyperkalemia noted--will recheck as has had steady trend upwards and has been on supplement likely contributing to this.   K dur was d/c this am by MD. Patient reports that she was talking on the phone when she felt funny, had a cramping and funny feeling in her right thigh and then passed out--did not remember how long it lasted. House keeping noted episode and alerted nurse who on evaluation reported that patient was alert and back to baseline. Currently in bed, A X O X 3, no HA or new neurological changes noted. She did report some dizziness initially with therapy this am. Encouraged fluid intake. Continue to monitor for orthostatic changes.

## 2018-12-09 NOTE — Progress Notes (Signed)
Speech Language Pathology Discharge Summary  Patient Details  Name: Stacy Cook MRN: [id number] Date of Birth: 1935-08-05  Today's Date: 12/09/2018 SLP Individual Time: 0825-0920 SLP Individual Time Calculation (min): 55 min   Skilled Therapeutic Interventions: Skilled treatment session focused on speech goals. Patient independently read aloud at the paragraph level in a moderately noisy environment with 100% intelligibility and overall Mod I for use of speech intelligibility strategies. Patient was also overall Mod I for use of strategies at the conversation level. Suspect patient is at her baseline level of speech intelligibility, therefore, patient will be discharged form skilled SLP intervention. She verbalized understanding and agreement. Patient left upright in wheelchair with alarm on and all needs within reach.    Patient has met 1 of 1 long term goals.  Patient to discharge at overall Modified Independent level.   Reasons goals not met: N/A   Clinical Impression/Discharge Summary: Patient has made functional gains and has met 1 of 1 LTGs this admission. Currently, patient is 100% intelligible at the conversation level with overall Mod I for use of speech intelligibility strategies. Suspect patient is at her baseline level of speech intelligibility, therefore, she will be discharged from skilled SLP intervention. Patient and family education is complete and f/u is not warranted at this time.   Recommendation:  None      Equipment: N/A   Reasons for discharge: Treatment goals met   Patient/Family Agrees with Progress Made and Goals Achieved: Yes    [place], [place] 12/09/2018, 9:24 AM

## 2018-12-09 NOTE — Telephone Encounter (Signed)
[facility] calling with urgerntl EKG result. Spoke to Legrand Como who stated patient was in ER.

## 2018-12-09 NOTE — Progress Notes (Addendum)
Patient was with Dr. Sima Matas when she had episode of unresponsiveness with drawing of left side with head turn to right. This lasted a few seconds --once patient aroused she was briefly disoriented without recall of what happened but reports this was similar to episodes in the past. Load with IV Keppra 1000 mg and start Keppra 500 mg bid.. Discussed with Dr. Leonel Ramsay who recommended EEG and he will follow up for further recommendations. Neurology to see  Further twitching of left leg after my initial eval--->1mg  IV ativan, 1mg  repeated to break seizure activity  Additionally Zio recorded an arhythmia per cards.   Seizure precautions/neuro-checks, oxygen IVF 1/2nS 75cc/hr Bed rest Will transfer to tele 3W for closer monitoring  Spoke to son  Meredith Staggers, MD, [facility] 12/09/2018
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
## 2018-12-09 NOTE — Progress Notes (Signed)
EEG Completed; Results Pending  

## 2018-12-09 NOTE — Procedures (Addendum)
Patient Name: Stacy Cook  MRN: [id number]  Epilepsy Attending: Lora Havens  Referring Physician/Provider: Reesa Chew, PA Date: 12/09/2018 Duration: 24.28 mins  Patient history: 83 yo M with left frontal infarct and now seizure like episode. EEG to evaluate for seizure.  Level of alertness: asleep/letahrgic   AEDs during EEG study: versed  Technical aspects: This EEG study was done with scalp electrodes positioned according to the 10-20 International system of electrode placement. Electrical activity was acquired at a sampling rate of 500Hz  and reviewed with a high frequency filter of 70Hz  and a low frequency filter of 1Hz . EEG data were recorded continuously and digitally stored.   DESCRIPTION:  There is an excessive amount of 15 to 18 Hz, 2-3 uV beta activity with irregular morphology distributed symmetrically and diffusely. Single sharp transient was seen in left frontotemporal region,maximal F7/T3. No seizures were seen.  Patient briefly opened eyes at end of study but no clear posterior dominant rhythm was seen. Hyperventilation and photic stimulation were not performed.  IMPRESSION: This study showed a single sharp transient during sleep in left frontotemporal region which could be epileptiform given the history of left frontal stroke. No seizures were seen throughout the recording. If suspicion for interictal activity remains a concern, consider longer study.   The excessive beta activity seen in the background is most likely due to the effect of benzodiazepine and is a benign EEG pattern.

## 2018-12-09 NOTE — Progress Notes (Signed)
Attempted x 2 for second IV access unsuccessful.  Currently has 1 functioning PIV, will defer other access at this time.  Brita Romp, RN
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
## 2018-12-10 ENCOUNTER — Encounter (HOSPITAL_COMMUNITY): Payer: Self-pay | Admitting: Internal Medicine

## 2018-12-10 ENCOUNTER — Ambulatory Visit: Payer: Medicare Other | Admitting: Physician Assistant

## 2018-12-10 ENCOUNTER — Observation Stay (HOSPITAL_COMMUNITY): Payer: Medicare Other

## 2018-12-10 DIAGNOSIS — G8191 Hemiplegia, unspecified affecting right dominant side: Secondary | ICD-10-CM | POA: Diagnosis not present

## 2018-12-10 DIAGNOSIS — Z955 Presence of coronary angioplasty implant and graft: Secondary | ICD-10-CM | POA: Diagnosis not present

## 2018-12-10 DIAGNOSIS — Z950 Presence of cardiac pacemaker: Secondary | ICD-10-CM

## 2018-12-10 DIAGNOSIS — I350 Nonrheumatic aortic (valve) stenosis: Secondary | ICD-10-CM | POA: Diagnosis present

## 2018-12-10 DIAGNOSIS — Z954 Presence of other heart-valve replacement: Secondary | ICD-10-CM | POA: Diagnosis not present

## 2018-12-10 DIAGNOSIS — I130 Hypertensive heart and chronic kidney disease with heart failure and stage 1 through stage 4 chronic kidney disease, or unspecified chronic kidney disease: Secondary | ICD-10-CM | POA: Diagnosis present

## 2018-12-10 DIAGNOSIS — Z7982 Long term (current) use of aspirin: Secondary | ICD-10-CM | POA: Diagnosis not present

## 2018-12-10 DIAGNOSIS — R569 Unspecified convulsions: Secondary | ICD-10-CM

## 2018-12-10 DIAGNOSIS — J910 Malignant pleural effusion: Secondary | ICD-10-CM | POA: Diagnosis present

## 2018-12-10 DIAGNOSIS — I6319 Cerebral infarction due to embolism of other precerebral artery: Secondary | ICD-10-CM | POA: Diagnosis not present

## 2018-12-10 DIAGNOSIS — Z887 Allergy status to serum and vaccine status: Secondary | ICD-10-CM | POA: Diagnosis not present

## 2018-12-10 DIAGNOSIS — D696 Thrombocytopenia, unspecified: Secondary | ICD-10-CM | POA: Diagnosis present

## 2018-12-10 DIAGNOSIS — I110 Hypertensive heart disease with heart failure: Secondary | ICD-10-CM | POA: Diagnosis not present

## 2018-12-10 DIAGNOSIS — E538 Deficiency of other specified B group vitamins: Secondary | ICD-10-CM | POA: Diagnosis present

## 2018-12-10 DIAGNOSIS — C3431 Malignant neoplasm of lower lobe, right bronchus or lung: Secondary | ICD-10-CM | POA: Diagnosis present

## 2018-12-10 DIAGNOSIS — I5032 Chronic diastolic (congestive) heart failure: Secondary | ICD-10-CM | POA: Diagnosis present

## 2018-12-10 DIAGNOSIS — M810 Age-related osteoporosis without current pathological fracture: Secondary | ICD-10-CM | POA: Diagnosis present

## 2018-12-10 DIAGNOSIS — R001 Bradycardia, unspecified: Secondary | ICD-10-CM | POA: Diagnosis present

## 2018-12-10 DIAGNOSIS — K219 Gastro-esophageal reflux disease without esophagitis: Secondary | ICD-10-CM | POA: Diagnosis present

## 2018-12-10 DIAGNOSIS — E875 Hyperkalemia: Secondary | ICD-10-CM | POA: Diagnosis present

## 2018-12-10 DIAGNOSIS — D638 Anemia in other chronic diseases classified elsewhere: Secondary | ICD-10-CM | POA: Diagnosis not present

## 2018-12-10 DIAGNOSIS — M62838 Other muscle spasm: Secondary | ICD-10-CM | POA: Diagnosis not present

## 2018-12-10 DIAGNOSIS — I459 Conduction disorder, unspecified: Secondary | ICD-10-CM

## 2018-12-10 DIAGNOSIS — I2510 Atherosclerotic heart disease of native coronary artery without angina pectoris: Secondary | ICD-10-CM | POA: Diagnosis present

## 2018-12-10 DIAGNOSIS — D509 Iron deficiency anemia, unspecified: Secondary | ICD-10-CM | POA: Diagnosis present

## 2018-12-10 DIAGNOSIS — N183 Chronic kidney disease, stage 3 (moderate): Secondary | ICD-10-CM | POA: Diagnosis present

## 2018-12-10 DIAGNOSIS — E1122 Type 2 diabetes mellitus with diabetic chronic kidney disease: Secondary | ICD-10-CM | POA: Diagnosis present

## 2018-12-10 DIAGNOSIS — D61818 Other pancytopenia: Secondary | ICD-10-CM | POA: Diagnosis not present

## 2018-12-10 DIAGNOSIS — D709 Neutropenia, unspecified: Secondary | ICD-10-CM | POA: Diagnosis not present

## 2018-12-10 DIAGNOSIS — E1159 Type 2 diabetes mellitus with other circulatory complications: Secondary | ICD-10-CM | POA: Diagnosis not present

## 2018-12-10 DIAGNOSIS — Z952 Presence of prosthetic heart valve: Secondary | ICD-10-CM | POA: Diagnosis not present

## 2018-12-10 DIAGNOSIS — I63432 Cerebral infarction due to embolism of left posterior cerebral artery: Secondary | ICD-10-CM | POA: Diagnosis not present

## 2018-12-10 DIAGNOSIS — Z7902 Long term (current) use of antithrombotics/antiplatelets: Secondary | ICD-10-CM | POA: Diagnosis not present

## 2018-12-10 DIAGNOSIS — I69351 Hemiplegia and hemiparesis following cerebral infarction affecting right dominant side: Secondary | ICD-10-CM | POA: Diagnosis not present

## 2018-12-10 DIAGNOSIS — T451X5A Adverse effect of antineoplastic and immunosuppressive drugs, initial encounter: Secondary | ICD-10-CM | POA: Diagnosis not present

## 2018-12-10 DIAGNOSIS — N179 Acute kidney failure, unspecified: Secondary | ICD-10-CM | POA: Diagnosis not present

## 2018-12-10 DIAGNOSIS — R64 Cachexia: Secondary | ICD-10-CM | POA: Diagnosis not present

## 2018-12-10 DIAGNOSIS — D701 Agranulocytosis secondary to cancer chemotherapy: Secondary | ICD-10-CM | POA: Diagnosis not present

## 2018-12-10 DIAGNOSIS — I6340 Cerebral infarction due to embolism of unspecified cerebral artery: Secondary | ICD-10-CM | POA: Diagnosis not present

## 2018-12-10 DIAGNOSIS — R0989 Other specified symptoms and signs involving the circulatory and respiratory systems: Secondary | ICD-10-CM | POA: Diagnosis not present

## 2018-12-10 DIAGNOSIS — I10 Essential (primary) hypertension: Secondary | ICD-10-CM | POA: Diagnosis not present

## 2018-12-10 DIAGNOSIS — I469 Cardiac arrest, cause unspecified: Secondary | ICD-10-CM | POA: Diagnosis not present

## 2018-12-10 DIAGNOSIS — D649 Anemia, unspecified: Secondary | ICD-10-CM | POA: Diagnosis not present

## 2018-12-10 DIAGNOSIS — K5900 Constipation, unspecified: Secondary | ICD-10-CM | POA: Diagnosis present

## 2018-12-10 DIAGNOSIS — I442 Atrioventricular block, complete: Secondary | ICD-10-CM | POA: Diagnosis present

## 2018-12-10 HISTORY — DX: Conduction disorder, unspecified: I45.9

## 2018-12-10 LAB — BASIC METABOLIC PANEL
Anion gap: 8 (ref 5–15)
BUN: 33 mg/dL — ABNORMAL HIGH (ref 8–23)
CO2: 20 mmol/L — ABNORMAL LOW (ref 22–32)
Calcium: 8.6 mg/dL — ABNORMAL LOW (ref 8.9–10.3)
Chloride: 113 mmol/L — ABNORMAL HIGH (ref 98–111)
Creatinine, Ser: 1.52 mg/dL — ABNORMAL HIGH (ref 0.44–1.00)
GFR calc Af Amer: 37 mL/min — ABNORMAL LOW (ref >60)
GFR calc non Af Amer: 32 mL/min — ABNORMAL LOW (ref >60)
Glucose, Bld: 94 mg/dL (ref 70–99)
Potassium: 5.4 mmol/L — ABNORMAL HIGH (ref 3.5–5.1)
Sodium: 141 mmol/L (ref 135–145)

## 2018-12-10 LAB — GLUCOSE, CAPILLARY
Glucose-Capillary: 87 mg/dL (ref 70–99)
Glucose-Capillary: 86 mg/dL (ref 70–99)
Glucose-Capillary: 96 mg/dL (ref 70–99)
Glucose-Capillary: 88 mg/dL (ref 70–99)

## 2018-12-10 LAB — CBC
HCT: 27.6 % — ABNORMAL LOW (ref 36.0–46.0)
Hemoglobin: 8.8 g/dL — ABNORMAL LOW (ref 12.0–15.0)
MCH: 29.9 pg (ref 26.0–34.0)
MCHC: 31.9 g/dL (ref 30.0–36.0)
MCV: 93.9 fL (ref 80.0–100.0)
Platelets: 45 10*3/uL — ABNORMAL LOW (ref 150–400)
RBC: 2.94 MIL/uL — ABNORMAL LOW (ref 3.87–5.11)
RDW: 13 % (ref 11.5–15.5)
WBC: 3.9 10*3/uL — ABNORMAL LOW (ref 4.0–10.5)
nRBC: 0 % (ref 0.0–0.2)

## 2018-12-10 MED ORDER — FERROUS SULFATE 325 (65 FE) MG PO TABS
325.0000 mg | ORAL_TABLET | Freq: Every day | ORAL | Status: DC
  Administered 2018-12-11 – 2018-12-12 (×2): 325 mg via ORAL
  Filled 2018-12-10 (×2): qty 1

## 2018-12-10 MED ORDER — ISOSORBIDE MONONITRATE ER 30 MG PO TB24
30.0000 mg | ORAL_TABLET | Freq: Every day | ORAL | Status: DC
  Administered 2018-12-11 – 2018-12-12 (×2): 30 mg via ORAL
  Filled 2018-12-10 (×2): qty 1

## 2018-12-10 MED ORDER — PATIROMER SORBITEX CALCIUM 8.4 G PO PACK
8.4000 g | PACK | Freq: Every day | ORAL | Status: AC
  Administered 2018-12-10: 8.4 g via ORAL
  Filled 2018-12-10 (×2): qty 1

## 2018-12-10 MED ORDER — CLOPIDOGREL BISULFATE 75 MG PO TABS
75.0000 mg | ORAL_TABLET | Freq: Every day | ORAL | Status: DC
  Administered 2018-12-11 – 2018-12-12 (×2): 75 mg via ORAL
  Filled 2018-12-10 (×2): qty 1

## 2018-12-10 MED ORDER — ASPIRIN 81 MG PO CHEW
81.0000 mg | CHEWABLE_TABLET | Freq: Every day | ORAL | Status: DC
  Administered 2018-12-11 – 2018-12-12 (×2): 81 mg via ORAL
  Filled 2018-12-10 (×2): qty 1

## 2018-12-10 NOTE — Progress Notes (Signed)
Subjective: No further seizures or syncope  Exam: Vitals:   12/10/18 0800 12/10/18 1336  BP: (!) 132/39 (!) 152/38  Pulse: 65 64  Resp: 17 16  Temp: 97.9 F (36.6 C) 98 F (36.7 C)  SpO2: 96% 97%   Gen: In bed, NAD Resp: non-labored breathing, no acute distress Abd: soft, nt  Neuro: MS: Awake, alert, oriented to person place, month year CN: Pupils equal round and reactive, extraocular movements intact, right facial droop Motor: Right hemiparesis, no movement in the right arm, 2/5 right leg  Pertinent Labs: EEG with left frontal sharp wave  Impression: 83 year old female with recent stroke following TAVR who had multiple long pauses yesterday as well as seizures.  Even though hypoperfusion is a consideration, the presence of an abnormal EEG as well as the fact that the seizure was prolonged, focal (consisted of jacksonian march starting in the left leg and marching up the left upper body) and associated with preserved consciousness, may be less certain that this was related to hypoperfusion event.  I would favor continued antiepileptic therapy at this time.   Recommendations: 1) continue Keppra 500 mg twice daily 2) follow-up with outpatient neurology 3) please call if there remains any questions or concerns.   Roland Rack, MD [facility] [phone]  If 7pm- 7am, please page neurology on call as listed in [place].

## 2018-12-10 NOTE — Progress Notes (Addendum)
Electrophysiology Rounding Note  Patient Name: Stacy Cook Date of Encounter: 12/10/2018  Primary Cardiologist: No primary care provider on file.  Electrophysiologist: New to Dr. Rayann Heman   Subjective   S/p MDT Micra AV model [id number] leadless cardiac pacemaker 12/09/2018  The patient is doing well this am. Awake and alert x 3. Son present at bedside.  At this time, the patient denies chest pain, shortness of breath, or any new concerns.  CXR this am with stable leadless pacemaker placement.   Inpatient Medications    Scheduled Meds: . insulin aspart  0-15 Units Subcutaneous TID WC  . levETIRAcetam  500 mg Oral BID  . osimertinib mesylate  80 mg Oral QPM  . sodium chloride flush  3 mL Intravenous Q12H   Continuous Infusions: . sodium chloride 10 mL/hr at 12/09/18 1814   PRN Meds: sodium chloride, acetaminophen, ondansetron (ZOFRAN) IV, oxyCODONE, polyethylene glycol, sodium chloride flush   Vital Signs    Vitals:   12/10/18 0100 12/10/18 0200 12/10/18 0400 12/10/18 0456  BP: (!) 126/37 (!) 123/52 (!) 129/49 (!) 112/43  Pulse: 73 74 73 74  Resp: 10 19 18 19   Temp:    99 F (37.2 C)  TempSrc:    Oral  SpO2: 98% 99% 97% 96%  Weight:        Intake/Output Summary (Last 24 hours) at 12/10/2018 0710 Last data filed at 12/10/2018 0200 Gross per 24 hour  Intake 77.67 ml  Output -  Net 77.67 ml   Filed Weights   12/09/18 2001  Weight: 52 kg    Physical Exam    GEN- The patient is elderly appearing, alert and oriented x 3 today.   Head- normocephalic, atraumatic Eyes-  Sclera clear, conjunctiva pink Ears- hearing intact Oropharynx- clear Neck- supple Lungs- Clear to ausculation bilaterally, normal work of breathing Heart- Regular rate and rhythm, no murmurs, rubs or gallops GI- soft, NT, ND, + BS Extremities- no clubbing, cyanosis, or edema. Bilateral groins with ecchymosis. Small hematoma L groin, but reportedly present since TAVR last week. No appreciable  bruit.  Skin- no rash or lesion Psych- euthymic mood, full affect Neuro- strength and sensation are intact  Labs    CBC Recent Labs    12/09/18 0524 12/10/18 0252  WBC 3.1* 3.9*  NEUTROABS 2.0  --   HGB 9.2* 8.8*  HCT 28.1* 27.6*  MCV 91.8 93.9  PLT 41* 45*   Basic Metabolic Panel Recent Labs    12/09/18 1123 12/10/18 0252  NA 139 141  K 5.0 5.4*  CL 112* 113*  CO2 20* 20*  GLUCOSE 100* 94  BUN 31* 33*  CREATININE 1.49* 1.52*  CALCIUM 8.9 8.6*   Liver Function Tests Recent Labs    12/08/18 0605  AST 17  ALT 12  ALKPHOS 44  BILITOT 0.9  PROT 5.3*  ALBUMIN 3.0*   No results for input(s): LIPASE, AMYLASE in the last 72 hours. Cardiac Enzymes No results for input(s): CKTOTAL, CKMB, CKMBINDEX, TROPONINI in the last 72 hours. BNP Invalid input(s): POCBNP D-Dimer No results for input(s): DDIMER in the last 72 hours. Hemoglobin A1C Recent Labs    12/09/18 2014  HGBA1C 5.3   Fasting Lipid Panel No results for input(s): CHOL, HDL, LDLCALC, TRIG, CHOLHDL, LDLDIRECT in the last 72 hours. Thyroid Function Tests No results for input(s): TSH, T4TOTAL, T3FREE, THYROIDAB in the last 72 hours.  Invalid input(s): FREET3  Telemetry    V paced 60s (personally reviewed)  Radiology  No results found.   Patient Profile     Stacy Cook is a 83 y.o. female with a past medical history significant for lung c/a and recent TAVR. She was admitted back from CIR with AV block and syncope.   Assessment & Plan    1. Intermittent symptomatic complete heart block with syncope S/p MDT Micra AV model [id number] leadless cardiac pacemaker CXR stable this am Stable device function by interrogation Wound/device check and follow up placed in AVS  2. Small left groin hematoma Reportedly not changed in size since TAVR last week. Stable on check today. No bruit.   For questions or updates, please contact [place] Please consult [email or web address] for contact info under  Cardiology/STEMI.  Signed, Shirley Friar, PA-C  12/10/2018, 7:10 AM     I have seen, examined the patient, and reviewed the above assessment and plan.  Changes to above are made where necessary.  On exam, elderly and frail, RRR.  Doing well sp PPM.  Device interrogation personally reviewed and normal.  I have discussed with Stacy Cook (Structural heart team) who states that given recent TAVR that ASA and Plavix are required, even with chronically low platelets.  I will resume these medicines as well as FeSO4 and imdur from discharge summary 12/05/2018.  Consider restarting losartan if BP remains elevated as renal function improves.  No further inpatient EP workup planned.  Ok to discharge or transfer back to rehab one other medical issues are resolved.  Electrophysiology team to see as needed while here. Please call with questions.   Co Sign: [name], MD 12/10/2018 9:21 PM

## 2018-12-10 NOTE — Progress Notes (Signed)
Pt appeared drowsy, arousable to awake, disoriented to time and situation. In charge nurse, Stacy Cook  agreed for Pt' son to stay at bedside tonight due to confusion. She presented with right side weakness with minimal right facial drooping, MD already made aware since previous stroke episode.  Right side of her lips bruising from previous fall.  Vital signs stable, BP 122/40- 118/41 mmHg, EKG  Ventricular paced on monitor, HR 62, SPO2 99% at room air. No acute distress noted.   Left groin presented hematoma, on arrival Cath lab RN reported not increase the size since previous TAVR, evaluate as level 1 hematoma. Dry dressing with gauze and Tegaderm, dry, clean and intact. Right groin dressing was dry, clean and intact, no hematoma noted.  Will continue to monitor.  Kennyth Lose, RN

## 2018-12-10 NOTE — Progress Notes (Addendum)
PROGRESS NOTE    Stacy Cook  LKG:[id number] DOB: [birth year] DOA: 12/09/2018 PCP: Adin Hector, MD  Brief Narrative: This is a 83 year old female with history of stage IV lung cancer,, malignant pleural effusion, severe aortic stenosis, underwent TAVR on 8/25 -Post TAVR her hospital course was complicated by embolic stroke, she had weakness of her right arm leg and face-MRI noted left anterior pontine and left frontal 2-3 cortical punctate infarcts this is felt to be a complication of TAVR, she was continued on dual antiplatelet agents and then subsequently discharged to rehab on 8/28, yesterday in [place] she was noted to have multiple unresponsive episodes with one episode where she had seizure type activity involving the left arm face and leg, with gaze and head turned to the right. -Subsequently cardiac monitor noted complete heart block, cardiology was consulted, she underwent emergent PPM placement yesterday, also seen by neurology and started on Keppra  Assessment & Plan:   Complete heart block with syncope -EP consulted, underwent emergent PPM placement yesterday -Cardiology following  Possible seizures -In the setting of above unresponsive spells which were likely secondary to complete heart block she was also noted to have seizure type activity as witnessed by staff and CIR -Neurology consulting -This is felt to be more than purely hypoperfusion related, she was started on Keppra 500 mg twice daily -Appears stable thus far, recent stroke could have been a potential trigger, EEG findings noted  Recent embolic stroke -Post TAVR, embolic, has mild right-sided deficits -Continue aspirin, Plavix, statin -PT, OT, CIR to reevaluate  Hyperkalemia -Etiology unclear, kidney function is stable, could be related to her chemotherapy med -[place] x1 now, monitor  Type 2 diabetes mellitus -Diet controlled, sliding scale insulin  Stage IV metastatic lung cancer -Followed by  oncology in [place] -She is on osimertinib -Also has malignant pleural effusion, not very symptomatic from this at this time, monitor  B12 deficiency -Supplement  Iron deficiency anemia -Stable  DVT prophylaxis: Add Lovenox Code Status: Full code Family Communication: Son at bedside Disposition Plan: CIR tomorrow  Consultants:   Cardiology   Procedures:   Antimicrobials:    Subjective: -Sleepy and tired this morning, suspected to be secondary to [place] -Was able to eat a light breakfast  Objective: Vitals:   12/10/18 0400 12/10/18 0456 12/10/18 0800 12/10/18 1336  BP: (!) 129/49 (!) 112/43 (!) 132/39 (!) 152/38  Pulse: 73 74 65 64  Resp: 18 19 17 16   Temp:  99 F (37.2 C) 97.9 F (36.6 C) 98 F (36.7 C)  TempSrc:  Oral Axillary Axillary  SpO2: 97% 96% 96% 97%  Weight:        Intake/Output Summary (Last 24 hours) at 12/10/2018 1358 Last data filed at 12/10/2018 1300 Gross per 24 hour  Intake 177.67 ml  Output 200 ml  Net -22.33 ml   Filed Weights   12/09/18 2001  Weight: 52 kg    Examination:  General exam: AAO x3, chronically ill-appearing  respiratory system: Few basilar crackles Cardiovascular system: S1 & S2 heard, RRR. Gastrointestinal system: Abdomen is nondistended, soft and nontender.Normal bowel sounds heard. Central nervous system: Alert and oriented. No focal neurological deficits. Extremities: No edema Skin: No rashes, lesions or ulcers Psychiatry: Judgement and insight appear normal. Mood & affect appropriate.     Data Reviewed:   CBC: Recent Labs  Lab 12/05/18 0334 12/06/18 0717 12/07/18 0837 12/09/18 0524 12/10/18 0252  WBC 3.2* 3.0* 3.4* 3.1* 3.9*  NEUTROABS  --  2.0  --  2.0  --   HGB 10.2* 10.1* 10.5* 9.2* 8.8*  HCT 30.1* 30.9* 31.8* 28.1* 27.6*  MCV 89.1 90.6 91.1 91.8 93.9  PLT 44* 42* 42* 41* 45*   Basic Metabolic Panel: Recent Labs  Lab 12/07/18 0837 12/08/18 0605 12/09/18 0524 12/09/18 1123 12/10/18 0252   NA 142 142 142 139 141  K 4.1 4.5 5.2* 5.0 5.4*  CL 107 113* 115* 112* 113*  CO2 25 23 21* 20* 20*  GLUCOSE 94 98 96 100* 94  BUN 23 28* 28* 31* 33*  CREATININE 1.32* 1.57* 1.49* 1.49* 1.52*  CALCIUM 8.9 8.8* 8.5* 8.9 8.6*   GFR: Estimated Creatinine Clearance: 22.3 mL/min (A) (by C-G formula based on SCr of 1.52 mg/dL (H)). Liver Function Tests: Recent Labs  Lab 12/08/18 0605  AST 17  ALT 12  ALKPHOS 44  BILITOT 0.9  PROT 5.3*  ALBUMIN 3.0*   No results for input(s): LIPASE, AMYLASE in the last 168 hours. No results for input(s): AMMONIA in the last 168 hours. Coagulation Profile: No results for input(s): INR, PROTIME in the last 168 hours. Cardiac Enzymes: No results for input(s): CKTOTAL, CKMB, CKMBINDEX, TROPONINI in the last 168 hours. BNP (last 3 results) No results for input(s): PROBNP in the last 8760 hours. HbA1C: Recent Labs    12/09/18 2014  HGBA1C 5.3   CBG: Recent Labs  Lab 12/09/18 1417 12/09/18 1433 12/09/18 2213 12/10/18 0643 12/10/18 1117  GLUCAP 124* 122* 132* 87 86   Lipid Profile: No results for input(s): CHOL, HDL, LDLCALC, TRIG, CHOLHDL, LDLDIRECT in the last 72 hours. Thyroid Function Tests: No results for input(s): TSH, T4TOTAL, FREET4, T3FREE, THYROIDAB in the last 72 hours. Anemia Panel: No results for input(s): VITAMINB12, FOLATE, FERRITIN, TIBC, IRON, RETICCTPCT in the last 72 hours. Urine analysis:    Component Value Date/Time   COLORURINE YELLOW 11/28/2018 1402   APPEARANCEUR CLOUDY (A) 11/28/2018 1402   APPEARANCEUR Clear 10/15/2011 1143   LABSPEC 1.010 11/28/2018 1402   LABSPEC 1.004 10/15/2011 1143   PHURINE 5.0 11/28/2018 1402   GLUCOSEU NEGATIVE 11/28/2018 1402   GLUCOSEU Negative 10/15/2011 1143   HGBUR SMALL (A) 11/28/2018 1402   BILIRUBINUR NEGATIVE 11/28/2018 1402   BILIRUBINUR Negative 10/15/2011 1143   KETONESUR NEGATIVE 11/28/2018 1402   PROTEINUR 30 (A) 11/28/2018 1402   NITRITE NEGATIVE 11/28/2018 1402    LEUKOCYTESUR LARGE (A) 11/28/2018 1402   LEUKOCYTESUR Trace 10/15/2011 1143   Sepsis Labs: @LABRCNTIP (procalcitonin:4,lacticidven:4)  )No results found for this or any previous visit (from the past 240 hour(s)).       Radiology Studies: Dg Chest 2 View  Result Date: 12/10/2018 CLINICAL DATA:  Status post Medtronic lead less pacemaker placement. EXAM: CHEST - 2 VIEW COMPARISON:  12/02/2018. FINDINGS: Interval Medtronic device projected at the anterior aspect of the heart to the left of midline. Stable aortic stent valve. Stable right basilar atelectasis and pleural fluid. Decreased linear atelectasis at the left lung base. Diffuse osteopenia. IMPRESSION: 1. Medtronic device projected at the anterior aspect of the heart to the left of midline. 2. Stable right basilar atelectasis and pleural fluid with improved left basilar atelectasis. Electronically Signed   By: Claudie Revering M.D.   On: 12/10/2018 10:50        Scheduled Meds: . insulin aspart  0-15 Units Subcutaneous TID WC  . levETIRAcetam  500 mg Oral BID  . osimertinib mesylate  80 mg Oral QPM  . sodium chloride flush  3 mL Intravenous Q12H   Continuous  Infusions: . sodium chloride 10 mL/hr at 12/09/18 1814     LOS: 1 day    Time spent: 30min    Domenic Polite, MD [facility] Page via [email or web address], password [email or web address] After 7PM please contact night-coverage  12/10/2018, 1:58 PM

## 2018-12-11 ENCOUNTER — Encounter (HOSPITAL_COMMUNITY): Payer: Self-pay | Admitting: General Practice

## 2018-12-11 ENCOUNTER — Ambulatory Visit: Payer: Medicare Other | Admitting: Internal Medicine

## 2018-12-11 ENCOUNTER — Other Ambulatory Visit: Payer: Self-pay

## 2018-12-11 ENCOUNTER — Other Ambulatory Visit: Payer: Medicare Other

## 2018-12-11 LAB — CBC
HCT: 22.9 % — ABNORMAL LOW (ref 36.0–46.0)
Hemoglobin: 7.4 g/dL — ABNORMAL LOW (ref 12.0–15.0)
MCH: 30.5 pg (ref 26.0–34.0)
MCHC: 32.3 g/dL (ref 30.0–36.0)
MCV: 94.2 fL (ref 80.0–100.0)
Platelets: 38 10*3/uL — ABNORMAL LOW (ref 150–400)
RBC: 2.43 MIL/uL — ABNORMAL LOW (ref 3.87–5.11)
RDW: 12.9 % (ref 11.5–15.5)
WBC: 2.6 10*3/uL — ABNORMAL LOW (ref 4.0–10.5)
nRBC: 0 % (ref 0.0–0.2)

## 2018-12-11 LAB — BASIC METABOLIC PANEL
Anion gap: 10 (ref 5–15)
BUN: 34 mg/dL — ABNORMAL HIGH (ref 8–23)
CO2: 20 mmol/L — ABNORMAL LOW (ref 22–32)
Calcium: 8.9 mg/dL (ref 8.9–10.3)
Chloride: 111 mmol/L (ref 98–111)
Creatinine, Ser: 1.55 mg/dL — ABNORMAL HIGH (ref 0.44–1.00)
GFR calc Af Amer: 36 mL/min — ABNORMAL LOW (ref >60)
GFR calc non Af Amer: 31 mL/min — ABNORMAL LOW (ref >60)
Glucose, Bld: 85 mg/dL (ref 70–99)
Potassium: 4.9 mmol/L (ref 3.5–5.1)
Sodium: 141 mmol/L (ref 135–145)

## 2018-12-11 LAB — GLUCOSE, CAPILLARY
Glucose-Capillary: 82 mg/dL (ref 70–99)
Glucose-Capillary: 73 mg/dL (ref 70–99)
Glucose-Capillary: 117 mg/dL — ABNORMAL HIGH (ref 70–99)
Glucose-Capillary: 135 mg/dL — ABNORMAL HIGH (ref 70–99)

## 2018-12-11 MED ORDER — LEVETIRACETAM 500 MG PO TABS: 500.0000 mg | ORAL_TABLET | Freq: Two times a day (BID) | ORAL | Status: DC

## 2018-12-11 MED ORDER — ENSURE ENLIVE PO LIQD
237.0000 mL | Freq: Two times a day (BID) | ORAL | Status: DC
  Administered 2018-12-11 – 2018-12-12 (×3): 237 mL via ORAL

## 2018-12-11 MED ORDER — TIZANIDINE HCL 4 MG PO TABS
2.0000 mg | ORAL_TABLET | Freq: Three times a day (TID) | ORAL | Status: DC | PRN
  Administered 2018-12-11: 2 mg via ORAL
  Filled 2018-12-11: qty 1

## 2018-12-11 MED FILL — Lidocaine HCl Local Inj 1%: INTRAMUSCULAR | Qty: 20 | Status: AC

## 2018-12-11 MED FILL — Heparin Sodium (Porcine) Inj 1000 Unit/ML: INTRAMUSCULAR | Qty: 10 | Status: AC

## 2018-12-11 MED FILL — Lidocaine HCl Local Inj 1%: INTRAMUSCULAR | Qty: 40 | Status: AC

## 2018-12-11 NOTE — Progress Notes (Signed)
Inpatient Rehab Admissions:  Inpatient Rehab Consult received.  Pt transferred back to acute on 9/1 for pacemaker placement.  Note OT saw this AM.  Awaiting post-op PT recommendations to open insurance for authorization for pt to return to CIR.  Will follow.    Shann Medal, PT, DPT Admissions Coordinator [phone] 12/11/18  11:10 AM

## 2018-12-11 NOTE — Evaluation (Signed)
Physical Therapy Evaluation Patient Details Name: Stacy Cook MRN: [id number] DOB: 16-Jul-1935 Today's Date: 12/11/2018   History of Present Illness  83 year old female with history of stage IV lung cancer,, malignant pleural effusion, severe aortic stenosis, underwent TAVR on 8/25. Post TAVR her hospital course was complicated by embolic stroke, she had weakness of her right arm leg and face-MRI noted left anterior pontine and left frontal 2-3 cortical punctate infarcts this is felt to be a complication of TAVR.  Seizure like activity in CIR, transferred back to Acute hospital and had PPM placed 9/1.  Clinical Impression  Pt admitted with above diagnosis. Pt was able to perform standing activities and exercise with PT.  Pt did well overall and is anxious to get back to REhab.  Agree with this plan.  Pt currently with functional limitations due to the deficits listed below (see PT Problem List). Pt will benefit from skilled PT to increase their independence and safety with mobility to allow discharge to the venue listed below.     Follow Up Recommendations CIR;Supervision/Assistance - 24 hour    Equipment Recommendations  Other (comment)(TBD)    Recommendations for Other Services Rehab consult     Precautions / Restrictions Precautions Precautions: Fall Precaution Comments: right hemiparesis      Mobility  Bed Mobility               General bed mobility comments: in recliner on arrival  Transfers Overall transfer level: Needs assistance   Transfers: Sit to/from Stand;Stand Pivot Transfers Sit to Stand: Min assist         General transfer comment: Initial stand from chair with RLE blocked and min assist. Sat in front of pt in chair with pts Bil UEs on PT shoulders and pt worked on stepping right LE forward and back with pt able to perform at least 8 x needing assist at times.  Worked on weight shifting as well.   Ambulation/Gait                Stairs             Wheelchair Mobility    Modified Rankin (Stroke Patients Only) Modified Rankin (Stroke Patients Only) Pre-Morbid Rankin Score: No symptoms Modified Rankin: Moderately severe disability     Balance Overall balance assessment: Needs assistance         Standing balance support: Single extremity supported;During functional activity Standing balance-Leahy Scale: Poor Standing balance comment: single UE support to stand                             Pertinent Vitals/Pain Pain Assessment: No/denies pain    Home Living Family/patient expects to be discharged to:: Inpatient rehab Living Arrangements: Alone Available Help at Discharge: Family;Available PRN/intermittently Type of Home: House Home Access: Stairs to enter Entrance Stairs-Rails: None(reports son plans to install rails) Technical brewer of Steps: 4 Home Layout: One level Home Equipment: Hand held shower head      Prior Function Level of Independence: Independent   Gait / Transfers Assistance Needed: Fully independent PTA  ADL's / Homemaking Assistance Needed: Completes all ADLs indepenently, drives  Comments: enjoys scrapbooking, Advice worker Dominance   Dominant Hand: Right    Extremity/Trunk Assessment   Upper Extremity Assessment Upper Extremity Assessment: Defer to OT evaluation    Lower Extremity Assessment RLE Deficits / Details: hip 2-/5, knee 2+/5, ankle DF 1/5, PF 2-/5 RLE  Sensation: decreased light touch;decreased proprioception RLE Coordination: decreased gross motor    Cervical / Trunk Assessment Cervical / Trunk Assessment: Other exceptions(weakness)  Communication   Communication: No difficulties  Cognition Arousal/Alertness: Awake/alert Behavior During Therapy: WFL for tasks assessed/performed Overall Cognitive Status: Within Functional Limits for tasks assessed                                        General Comments       Exercises General Exercises - Lower Extremity Ankle Circles/Pumps: AAROM;10 reps;Right;Seated Long Arc Quad: AROM;Right;Seated;15 reps Hip Flexion/Marching: AROM;Right;Seated;15 reps   Assessment/Plan    PT Assessment Patient needs continued PT services  PT Problem List Decreased mobility;Decreased balance;Decreased coordination;Decreased strength;Decreased range of motion;Decreased activity tolerance;Decreased knowledge of use of DME;Decreased safety awareness;Decreased knowledge of precautions       PT Treatment Interventions DME instruction;Gait training;Stair training;Functional mobility training;Therapeutic activities;Therapeutic exercise;Balance training;Patient/family education    PT Goals (Current goals can be found in the Care Plan section)  Acute Rehab PT Goals Patient Stated Goal: get back to CIR PT Goal Formulation: With patient Time For Goal Achievement: 12/25/18 Potential to Achieve Goals: Good    Frequency Min 3X/week   Barriers to discharge        Co-evaluation               AM-PAC PT "6 Clicks" Mobility  Outcome Measure Help needed turning from your back to your side while in a flat bed without using bedrails?: A Little Help needed moving from lying on your back to sitting on the side of a flat bed without using bedrails?: A Little Help needed moving to and from a bed to a chair (including a wheelchair)?: A Little Help needed standing up from a chair using your arms (e.g., wheelchair or bedside chair)?: A Little Help needed to walk in hospital room?: A Lot Help needed climbing 3-5 steps with a railing? : A Lot 6 Click Score: 16    End of Session Equipment Utilized During Treatment: Gait belt Activity Tolerance: Patient tolerated treatment well Patient left: in chair;with call bell/phone within reach Nurse Communication: Mobility status PT Visit Diagnosis: Unsteadiness on feet (R26.81);Muscle weakness (generalized) (M62.81)    Time: 0093-8182 PT  Time Calculation (min) (ACUTE ONLY): 18 min   Charges:   PT Evaluation $PT Eval Moderate Complexity: [place] Pager:  [phone]  Office:  [place] 12/11/2018, 1:55 PM

## 2018-12-11 NOTE — Discharge Summary (Signed)
Physician Discharge Summary  Stacy Cook ZOX:[id number] DOB: 02-May-1935 DOA: 12/09/2018  PCP: Adin Hector, MD  Admit date: 12/09/2018 Discharge date: 12/11/2018  Admitted From: CIR Disposition:  CIR  Recommendations for Outpatient Follow-up:  1. Follow up with PCP in 1-2 weeks 2. Please obtain BMP/CBC in one week 3. Please follow up on the following pending results:  Home Health: no Equipment/Devices:  Discharge Condition: Stable CODE STATUS: FULL Diet recommendation: Heart Healthy,diabetc  Brief/Interim Summary:  83 year old female with history of stage IV lung cancer,, malignant pleural effusion, severe aortic stenosis, underwent TAVR on 8/25 -Post TAVR her hospital course was complicated by embolic stroke, she had weakness of her right arm leg and face-MRI noted left anterior pontine and left frontal 2-3 cortical punctate infarcts this is felt to be a complication of TAVR, she was continued on dual antiplatelet agents and then subsequently discharged to rehab on 8/28, yesterday in [place] she was noted to have multiple unresponsive episodes with one episode where she had seizure type activity involving the left arm face and leg, with gaze and head turned to the right. -Subsequently cardiac monitor noted complete heart block, cardiology was consulted, she underwent emergent Leadless PPM placement done 12/09/18 also seen by neurology and started on Keppra. At this time patient medically stable.  Is to follow-up with cardiology and neurology as outpatient.  She is to continue with inpatient rehabilitation. Per cardiology patient is to continue with aspirin and Plavix given recent TAVR event with chronically low platelets, patient will continue with iron supplementation, Imdur.  Consider restarting her losartan if  BP  And creatinine stable  Discharge Diagnoses:  Principal Problem:   Cardiac asystole ([place]) Active Problems:   B12 deficiency   CAD S/P percutaneous coronary  angioplasty   Type 2 diabetes mellitus (HCC)   Primary malignant neoplasm of right lower lobe of lung (HCC)   Acquired iron deficiency anemia due to decreased absorption   Hypertension   S/P TAVR (transcatheter aortic valve replacement)   Embolic stroke (HCC)  Complete heart block with syncope -EP consulted, underwent emergent PPM placement 12/09/2018, heart rate is stable.  No further recommendation from cardiology to continue on CIR  Possible seizures -In the setting of above unresponsive spells which were likely secondary to complete heart block she was also noted to have seizure type activity as witnessed by staff and CIR -Neurology consulted-This is felt to be more than purely hypoperfusion related, she was started on Keppra 500 mg twice daily  -Appears stable thus far, recent stroke could have been a potential trigger, EEG findings noted  Recent embolic stroke -Post TAVR, embolic, has mild right-sided deficits -Continue aspirin, Plavix, statin-even though chronically low platelet count.  Need close monitoring. -PT, OT, CIR to reevaluate  Hyperkalemia: Improved.-Etiology unclear, kidney function is stable, could be related to her chemotherapy med -Veltassa x1.  Type 2 diabetes mellitus -Diet controlled, sliding scale insulin  Stage IV metastatic lung cancer -Followed by oncology in [place] -She is on osimertinib -Also has malignant pleural effusion, not very symptomatic from this at this time, monitor  B12 deficiency- cont Supplement  Chronic thrombocytopenia, platelet chronically low, patient is on aspirin and Plavix please monitor closely. She reports it has been low for a while form ITP.was followed by hem-onc in cancer unit per her  Recent Labs  Lab 12/06/18 0717 12/07/18 0837 12/09/18 0524 12/10/18 0252 12/11/18 0252  PLT 42* 42* 41* 45* 38*   Iron deficiency anemia -Stable  Discharge Instructions  Discharge Instructions    Call MD for:  persistant  nausea and vomiting   Complete by: As directed    Call MD for:  severe uncontrolled pain   Complete by: As directed    Call MD for:  temperature >100.4   Complete by: As directed    Diet - low sodium heart healthy   Complete by: As directed    Increase activity slowly   Complete by: As directed      Allergies as of 12/11/2018      Reactions   Mirtazapine Other (See Comments)   Sluggish    Pneumococcal Vaccine Itching, Other (See Comments)   Hives and fever   Statins Other (See Comments)   Joint pain   Sulfa Antibiotics Nausea And Vomiting      Medication List    STOP taking these medications   metoprolol succinate 50 MG 24 hr tablet Commonly known as: TOPROL-XL     TAKE these medications   aspirin EC 81 MG tablet Take 81 mg by mouth daily.   cholecalciferol 1000 units tablet Commonly known as: VITAMIN D Take 1,000 Units by mouth daily.   clopidogrel 75 MG tablet Commonly known as: PLAVIX Take 1 tablet (75 mg total) by mouth daily.   cyanocobalamin 1000 MCG tablet Take 1,000 mcg by mouth daily.   ferrous sulfate 325 (65 FE) MG tablet Take 325 mg by mouth daily with breakfast.   isosorbide mononitrate 30 MG 24 hr tablet Commonly known as: IMDUR Take 1 tablet (30 mg total) by mouth daily.   levETIRAcetam 500 MG tablet Commonly known as: KEPPRA Take 1 tablet (500 mg total) by mouth 2 (two) times daily.   nitroGLYCERIN 0.4 MG SL tablet Commonly known as: NITROSTAT Place 0.4 mg under the tongue every 5 (five) minutes as needed for chest pain.   osimertinib mesylate 80 MG tablet Commonly known as: Tagrisso Take 1 tablet (80 mg total) by mouth daily. What changed: when to take this   pantoprazole 40 MG tablet Commonly known as: PROTONIX Take 40 mg by mouth daily.   triamcinolone ointment 0.5 % Commonly known as: KENALOG Apply 1 application topically 2 (two) times daily.      Follow-up Information    [facility] Follow up on 12/30/2018.   Why: at 0930 for post PPM implant check Contact information: [address] [address] [phone]       [name], MD Follow up on 03/19/2019.   Specialty: Cardiology Why: at 200 pm for 3 month post PPM implant check Contact information: [address] [place] [address] [phone]        [facility] Follow up in 2 week(s).   Contact information: [place], [place] [address] [phone]         Allergies  Allergen Reactions  . Mirtazapine Other (See Comments)    Sluggish   . Pneumococcal Vaccine Itching and Other (See Comments)    Hives and fever  . Statins Other (See Comments)    Joint pain  . Sulfa Antibiotics Nausea And Vomiting    Consultations:  CARDIOLOGY  neurology    Procedures/Studies: Ct Angio Head W Or Wo Contrast  Result Date: 12/02/2018 CLINICAL DATA:  Right-sided weakness and slurred speech EXAM: CT ANGIOGRAPHY HEAD AND NECK CT PERFUSION BRAIN TECHNIQUE: Multidetector CT imaging of the head and neck was performed using the standard protocol during bolus  administration of intravenous contrast. Multiplanar CT image reconstructions and MIPs were obtained to evaluate the vascular anatomy. Carotid stenosis measurements (when applicable) are obtained utilizing NASCET criteria, using the distal internal carotid diameter as the denominator. Multiphase CT imaging of the brain was performed following IV bolus contrast injection. Subsequent parametric perfusion maps were calculated using RAPID software. CONTRAST:  18mL OMNIPAQUE IOHEXOL 350 MG/ML SOLN COMPARISON:  Head CT same day FINDINGS: CTA NECK FINDINGS SKELETON: There is no bony spinal canal stenosis. No lytic or blastic lesion. OTHER NECK: Normal pharynx, larynx and major salivary glands. No cervical lymphadenopathy. Unremarkable thyroid gland. UPPER CHEST: Incompletely  visualized right pleural effusion. AORTIC ARCH: There is mild calcific atherosclerosis of the aortic arch. There is no aneurysm, dissection or hemodynamically significant stenosis of the visualized ascending aorta and aortic arch. Conventional 3 vessel aortic branching pattern. The visualized proximal subclavian arteries are widely patent. RIGHT CAROTID SYSTEM: --Common carotid artery: Widely patent origin without common carotid artery dissection or aneurysm. --Internal carotid artery: No dissection, occlusion or aneurysm. Mild atherosclerotic calcification at the carotid bifurcation without hemodynamically significant stenosis. --External carotid artery: No acute abnormality. LEFT CAROTID SYSTEM: --Common carotid artery: Widely patent origin without common carotid artery dissection or aneurysm. --Internal carotid artery: No dissection, occlusion or aneurysm. There is mixed density atherosclerosis extending into the proximal ICA, resulting in less than 50% stenosis. --External carotid artery: No acute abnormality. VERTEBRAL ARTERIES: Left dominant configuration. Both origins are clearly patent. No dissection, occlusion or flow-limiting stenosis to the skull base (V1-V3 segments). CTA HEAD FINDINGS POSTERIOR CIRCULATION: --Vertebral arteries: Calcification both V4 segments with mild-to-moderate narrowing bilaterally. --Posterior inferior cerebellar arteries (PICA): The left PICA and AICA share a common origin. Normal origin of right PICA from the ipsilateral vertebral artery. --Anterior inferior cerebellar arteries (AICA): Patent origins from the basilar artery. --Basilar artery: Normal. --Superior cerebellar arteries: Normal. --Posterior cerebral arteries (PCA): Normal. The left PCA is partially supplied by a posterior communicating artery (p-comm). ANTERIOR CIRCULATION: --Intracranial internal carotid arteries: Atherosclerotic calcification of the internal carotid arteries at the skull base without hemodynamically  significant stenosis. --Anterior cerebral arteries (ACA): Normal. Both A1 segments are present. Patent anterior communicating artery (a-comm). --Middle cerebral arteries (MCA): Normal. VENOUS SINUSES: As permitted by contrast timing, patent. ANATOMIC VARIANTS: None Review of the MIP images confirms the above findings. CT Brain Perfusion Findings: ASPECTS: 10 CBF (<30%) Volume: 85mL Perfusion (Tmax>6.0s) volume: 31mL Mismatch Volume: 85mL Infarction Location:None IMPRESSION: 1. Normal CT perfusion scan of the brain. 2. No emergent large vessel occlusion or hemodynamically significant stenosis. 3. Bilateral carotid bifurcation atherosclerosis and atherosclerosis of the internal carotid arteries and vertebral arteries at the skull base without high-grade stenosis. 4. Incompletely visualized right pleural effusion. 5.  Aortic atherosclerosis (ICD10-I70.0). Electronically Signed   By: Ulyses Jarred M.D.   On: 12/02/2018 22:42   Dg Chest 2 View  Result Date: 12/10/2018 CLINICAL DATA:  Status post Medtronic lead less pacemaker placement. EXAM: CHEST - 2 VIEW COMPARISON:  12/02/2018. FINDINGS: Interval Medtronic device projected at the anterior aspect of the heart to the left of midline. Stable aortic stent valve. Stable right basilar atelectasis and pleural fluid. Decreased linear atelectasis at the left lung base. Diffuse osteopenia. IMPRESSION: 1. Medtronic device projected at the anterior aspect of the heart to the left of midline. 2. Stable right basilar atelectasis and pleural fluid with improved left basilar atelectasis. Electronically Signed   By: Claudie Revering M.D.   On: 12/10/2018 10:50   Dg Chest 2 View  Result Date: 11/28/2018 CLINICAL DATA:  Severe aortic stenosis.  Pre-admission evaluation. EXAM: CHEST - 2 VIEW COMPARISON:  09/12/2018 chest radiograph. FINDINGS: Stable cardiomediastinal silhouette with top-normal heart size. No pneumothorax. Small right pleural effusion. No left pleural effusion. No  pulmonary edema. Mild right basilar atelectasis. IMPRESSION: Small right pleural effusion with mild right basilar atelectasis. Electronically Signed   By: Ilona Sorrel M.D.   On: 11/28/2018 17:28   Ct Angio Neck W Or Wo Contrast  Result Date: 12/02/2018 CLINICAL DATA:  Right-sided weakness and slurred speech EXAM: CT ANGIOGRAPHY HEAD AND NECK CT PERFUSION BRAIN TECHNIQUE: Multidetector CT imaging of the head and neck was performed using the standard protocol during bolus administration of intravenous contrast. Multiplanar CT image reconstructions and MIPs were obtained to evaluate the vascular anatomy. Carotid stenosis measurements (when applicable) are obtained utilizing NASCET criteria, using the distal internal carotid diameter as the denominator. Multiphase CT imaging of the brain was performed following IV bolus contrast injection. Subsequent parametric perfusion maps were calculated using RAPID software. CONTRAST:  56mL OMNIPAQUE IOHEXOL 350 MG/ML SOLN COMPARISON:  Head CT same day FINDINGS: CTA NECK FINDINGS SKELETON: There is no bony spinal canal stenosis. No lytic or blastic lesion. OTHER NECK: Normal pharynx, larynx and major salivary glands. No cervical lymphadenopathy. Unremarkable thyroid gland. UPPER CHEST: Incompletely visualized right pleural effusion. AORTIC ARCH: There is mild calcific atherosclerosis of the aortic arch. There is no aneurysm, dissection or hemodynamically significant stenosis of the visualized ascending aorta and aortic arch. Conventional 3 vessel aortic branching pattern. The visualized proximal subclavian arteries are widely patent. RIGHT CAROTID SYSTEM: --Common carotid artery: Widely patent origin without common carotid artery dissection or aneurysm. --Internal carotid artery: No dissection, occlusion or aneurysm. Mild atherosclerotic calcification at the carotid bifurcation without hemodynamically significant stenosis. --External carotid artery: No acute abnormality. LEFT  CAROTID SYSTEM: --Common carotid artery: Widely patent origin without common carotid artery dissection or aneurysm. --Internal carotid artery: No dissection, occlusion or aneurysm. There is mixed density atherosclerosis extending into the proximal ICA, resulting in less than 50% stenosis. --External carotid artery: No acute abnormality. VERTEBRAL ARTERIES: Left dominant configuration. Both origins are clearly patent. No dissection, occlusion or flow-limiting stenosis to the skull base (V1-V3 segments). CTA HEAD FINDINGS POSTERIOR CIRCULATION: --Vertebral arteries: Calcification both V4 segments with mild-to-moderate narrowing bilaterally. --Posterior inferior cerebellar arteries (PICA): The left PICA and AICA share a common origin. Normal origin of right PICA from the ipsilateral vertebral artery. --Anterior inferior cerebellar arteries (AICA): Patent origins from the basilar artery. --Basilar artery: Normal. --Superior cerebellar arteries: Normal. --Posterior cerebral arteries (PCA): Normal. The left PCA is partially supplied by a posterior communicating artery (p-comm). ANTERIOR CIRCULATION: --Intracranial internal carotid arteries: Atherosclerotic calcification of the internal carotid arteries at the skull base without hemodynamically significant stenosis. --Anterior cerebral arteries (ACA): Normal. Both A1 segments are present. Patent anterior communicating artery (a-comm). --Middle cerebral arteries (MCA): Normal. VENOUS SINUSES: As permitted by contrast timing, patent. ANATOMIC VARIANTS: None Review of the MIP images confirms the above findings. CT Brain Perfusion Findings: ASPECTS: 10 CBF (<30%) Volume: 46mL Perfusion (Tmax>6.0s) volume: 60mL Mismatch Volume: 63mL Infarction Location:None IMPRESSION: 1. Normal CT perfusion scan of the brain. 2. No emergent large vessel occlusion or hemodynamically significant stenosis. 3. Bilateral carotid bifurcation atherosclerosis and atherosclerosis of the internal carotid  arteries and vertebral arteries at the skull base without high-grade stenosis. 4. Incompletely visualized right pleural effusion. 5.  Aortic atherosclerosis (ICD10-I70.0). Electronically Signed   By: Cletus Gash.D.  On: 12/02/2018 22:42   Mr Angio Head Wo Contrast  Result Date: 12/03/2018 CLINICAL DATA:  Stroke.  Right-sided numbness EXAM: MRI HEAD WITHOUT CONTRAST MRA HEAD WITHOUT CONTRAST TECHNIQUE: Multiplanar, multiecho pulse sequences of the brain and surrounding structures were obtained without intravenous contrast. Angiographic images of the head were obtained using MRA technique without contrast. COMPARISON:  CTA and CT perfusion 12/02/2018 FINDINGS: MRI HEAD FINDINGS Brain: Acute infarct left anterior pons approximately 10 x 12 mm. 2 small areas of acute infarct in the left frontal lobe over the convexity, under 5 mm each. Ventricle size and cerebral volume normal. Mild chronic microvascular ischemic type changes in the white matter. Negative for hemorrhage or mass or fluid collection. Vascular: Normal arterial flow voids Skull and upper cervical spine: Negative Sinuses/Orbits: Mild mucosal edema paranasal sinuses. Bilateral cataract surgery Other: None MRA HEAD FINDINGS Mild atherosclerotic disease distal vertebral artery bilaterally causing mild stenosis. Right PICA patent. Left PICA patent. Prominent AICA bilaterally. Basilar widely patent. Superior cerebellar and posterior cerebral arteries patent bilaterally. Posterior communicating artery patent bilaterally. Atherosclerotic disease in the cavernous carotid bilaterally without significant stenosis. Anterior and middle cerebral arteries are patent bilaterally without occlusion. Mild irregularity and stenosis M2 branches bilaterally. Negative for cerebral aneurysm. IMPRESSION: 1. 10 x 12 mm acute infarct left anterior pons. 2. Punctate areas of acute infarct in the left frontal lobe. 3. Mild chronic microvascular ischemic change in the white  matter. 4. Mild intracranial atherosclerotic disease without large vessel occlusion. Mild stenosis distal vertebral artery bilaterally. Basilar appears widely patent. Electronically Signed   By: Franchot Gallo M.D.   On: 12/03/2018 11:07   Mr Brain Wo Contrast  Result Date: 12/03/2018 CLINICAL DATA:  Stroke.  Right-sided numbness EXAM: MRI HEAD WITHOUT CONTRAST MRA HEAD WITHOUT CONTRAST TECHNIQUE: Multiplanar, multiecho pulse sequences of the brain and surrounding structures were obtained without intravenous contrast. Angiographic images of the head were obtained using MRA technique without contrast. COMPARISON:  CTA and CT perfusion 12/02/2018 FINDINGS: MRI HEAD FINDINGS Brain: Acute infarct left anterior pons approximately 10 x 12 mm. 2 small areas of acute infarct in the left frontal lobe over the convexity, under 5 mm each. Ventricle size and cerebral volume normal. Mild chronic microvascular ischemic type changes in the white matter. Negative for hemorrhage or mass or fluid collection. Vascular: Normal arterial flow voids Skull and upper cervical spine: Negative Sinuses/Orbits: Mild mucosal edema paranasal sinuses. Bilateral cataract surgery Other: None MRA HEAD FINDINGS Mild atherosclerotic disease distal vertebral artery bilaterally causing mild stenosis. Right PICA patent. Left PICA patent. Prominent AICA bilaterally. Basilar widely patent. Superior cerebellar and posterior cerebral arteries patent bilaterally. Posterior communicating artery patent bilaterally. Atherosclerotic disease in the cavernous carotid bilaterally without significant stenosis. Anterior and middle cerebral arteries are patent bilaterally without occlusion. Mild irregularity and stenosis M2 branches bilaterally. Negative for cerebral aneurysm. IMPRESSION: 1. 10 x 12 mm acute infarct left anterior pons. 2. Punctate areas of acute infarct in the left frontal lobe. 3. Mild chronic microvascular ischemic change in the white matter. 4.  Mild intracranial atherosclerotic disease without large vessel occlusion. Mild stenosis distal vertebral artery bilaterally. Basilar appears widely patent. Electronically Signed   By: Franchot Gallo M.D.   On: 12/03/2018 11:07   Ct Cerebral Perfusion W Contrast  Result Date: 12/02/2018 CLINICAL DATA:  Right-sided weakness and slurred speech EXAM: CT ANGIOGRAPHY HEAD AND NECK CT PERFUSION BRAIN TECHNIQUE: Multidetector CT imaging of the head and neck was performed using the standard protocol during bolus administration  of intravenous contrast. Multiplanar CT image reconstructions and MIPs were obtained to evaluate the vascular anatomy. Carotid stenosis measurements (when applicable) are obtained utilizing NASCET criteria, using the distal internal carotid diameter as the denominator. Multiphase CT imaging of the brain was performed following IV bolus contrast injection. Subsequent parametric perfusion maps were calculated using RAPID software. CONTRAST:  55mL OMNIPAQUE IOHEXOL 350 MG/ML SOLN COMPARISON:  Head CT same day FINDINGS: CTA NECK FINDINGS SKELETON: There is no bony spinal canal stenosis. No lytic or blastic lesion. OTHER NECK: Normal pharynx, larynx and major salivary glands. No cervical lymphadenopathy. Unremarkable thyroid gland. UPPER CHEST: Incompletely visualized right pleural effusion. AORTIC ARCH: There is mild calcific atherosclerosis of the aortic arch. There is no aneurysm, dissection or hemodynamically significant stenosis of the visualized ascending aorta and aortic arch. Conventional 3 vessel aortic branching pattern. The visualized proximal subclavian arteries are widely patent. RIGHT CAROTID SYSTEM: --Common carotid artery: Widely patent origin without common carotid artery dissection or aneurysm. --Internal carotid artery: No dissection, occlusion or aneurysm. Mild atherosclerotic calcification at the carotid bifurcation without hemodynamically significant stenosis. --External carotid  artery: No acute abnormality. LEFT CAROTID SYSTEM: --Common carotid artery: Widely patent origin without common carotid artery dissection or aneurysm. --Internal carotid artery: No dissection, occlusion or aneurysm. There is mixed density atherosclerosis extending into the proximal ICA, resulting in less than 50% stenosis. --External carotid artery: No acute abnormality. VERTEBRAL ARTERIES: Left dominant configuration. Both origins are clearly patent. No dissection, occlusion or flow-limiting stenosis to the skull base (V1-V3 segments). CTA HEAD FINDINGS POSTERIOR CIRCULATION: --Vertebral arteries: Calcification both V4 segments with mild-to-moderate narrowing bilaterally. --Posterior inferior cerebellar arteries (PICA): The left PICA and AICA share a common origin. Normal origin of right PICA from the ipsilateral vertebral artery. --Anterior inferior cerebellar arteries (AICA): Patent origins from the basilar artery. --Basilar artery: Normal. --Superior cerebellar arteries: Normal. --Posterior cerebral arteries (PCA): Normal. The left PCA is partially supplied by a posterior communicating artery (p-comm). ANTERIOR CIRCULATION: --Intracranial internal carotid arteries: Atherosclerotic calcification of the internal carotid arteries at the skull base without hemodynamically significant stenosis. --Anterior cerebral arteries (ACA): Normal. Both A1 segments are present. Patent anterior communicating artery (a-comm). --Middle cerebral arteries (MCA): Normal. VENOUS SINUSES: As permitted by contrast timing, patent. ANATOMIC VARIANTS: None Review of the MIP images confirms the above findings. CT Brain Perfusion Findings: ASPECTS: 10 CBF (<30%) Volume: 41mL Perfusion (Tmax>6.0s) volume: 78mL Mismatch Volume: 25mL Infarction Location:None IMPRESSION: 1. Normal CT perfusion scan of the brain. 2. No emergent large vessel occlusion or hemodynamically significant stenosis. 3. Bilateral carotid bifurcation atherosclerosis and  atherosclerosis of the internal carotid arteries and vertebral arteries at the skull base without high-grade stenosis. 4. Incompletely visualized right pleural effusion. 5.  Aortic atherosclerosis (ICD10-I70.0). Electronically Signed   By: Ulyses Jarred M.D.   On: 12/02/2018 22:42   Dg Chest Port 1 View  Result Date: 12/02/2018 CLINICAL DATA:  Status post transcatheter aortic valve replacement. EXAM: PORTABLE CHEST 1 VIEW COMPARISON:  Radiographs of November 28, 2018. FINDINGS: Stable cardiomediastinal silhouette. Aortic valve prosthesis is noted. Atherosclerosis of thoracic aorta is noted. No pneumothorax is noted. Left lung is clear. Small right pleural effusion is noted with associated atelectasis. Bony thorax is unremarkable. IMPRESSION: Stable small right pleural effusion is noted with associated atelectasis. Aortic Atherosclerosis (ICD10-I70.0). Electronically Signed   By: Marijo Conception M.D.   On: 12/02/2018 18:50   Ct Head Code Stroke Wo Contrast  Result Date: 12/02/2018 CLINICAL DATA:  Code stroke.  Right-sided weakness  EXAM: CT HEAD WITHOUT CONTRAST TECHNIQUE: Contiguous axial images were obtained from the base of the skull through the vertex without intravenous contrast. COMPARISON:  None. FINDINGS: Brain: There is no mass, hemorrhage or extra-axial collection. The size and configuration of the ventricles and extra-axial CSF spaces are normal. The brain parenchyma is normal, without evidence of acute or chronic infarction. Vascular: Atherosclerotic calcification of the vertebral and internal carotid arteries at the skull base. No abnormal hyperdensity of the major intracranial arteries or dural venous sinuses. Skull: The visualized skull base, calvarium and extracranial soft tissues are normal. Sinuses/Orbits: No fluid levels or advanced mucosal thickening of the visualized paranasal sinuses. No mastoid or middle ear effusion. The orbits are normal. ASPECTS [facility] Stroke Program Early CT Score) -  Ganglionic level infarction (caudate, lentiform nuclei, internal capsule, insula, M1-M3 cortex): 7 - Supraganglionic infarction (M4-M6 cortex): 3 Total score (0-10 with 10 being normal): 10 IMPRESSION: 1. No acute abnormality. 2. ASPECTS is 10. These results were communicated to Dr. Amie Portland at 10:32 pm on 12/02/2018 by text page via the [facility] messaging system. Electronically Signed   By: Ulyses Jarred M.D.   On: 12/02/2018 22:32     Subjective: At bedside chair on RA, mild cough. No chest pain, shortness of breath. Feels wekan " I AM READY FOR REHAB"  Discharge Exam: Vitals:   12/11/18 0445 12/11/18 0849  BP: (!) 144/32 (!) 139/35  Pulse: 66   Resp: 17   Temp: 98.3 F (36.8 C)   SpO2: 98%    Vitals:   12/10/18 1336 12/10/18 1920 12/11/18 0445 12/11/18 0849  BP: (!) 152/38 (!) 149/38 (!) 144/32 (!) 139/35  Pulse: 64 67 66   Resp: 16 17 17    Temp: 98 F (36.7 C) 98.1 F (36.7 C) 98.3 F (36.8 C)   TempSrc: Axillary Oral Oral   SpO2: 97% 99% 98%   Weight:      Height:  5\' 1"  (1.549 m)      General: Pt is alert, awake, not in acute distress Cardiovascular: RRR, S1/S2 +, no rubs, no gallops Respiratory: CTA bilaterally, no wheezing, no rhonchi Abdominal: Soft, NT, ND, bowel sounds + Extremities: no edema, no cyanosis   The results of significant diagnostics from this hospitalization (including imaging, microbiology, ancillary and laboratory) are listed below for reference.     Microbiology: No results found for this or any previous visit (from the past 240 hour(s)).   Labs: BNP (last 3 results) Recent Labs    02/21/18 0901 11/28/18 1402  BNP 163.0* 440.3*   Basic Metabolic Panel: Recent Labs  Lab 12/08/18 0605 12/09/18 0524 12/09/18 1123 12/10/18 0252 12/11/18 0252  NA 142 142 139 141 141  K 4.5 5.2* 5.0 5.4* 4.9  CL 113* 115* 112* 113* 111  CO2 23 21* 20* 20* 20*  GLUCOSE 98 96 100* 94 85  BUN 28* 28* 31* 33* 34*  CREATININE 1.57* 1.49* 1.49* 1.52*  1.55*  CALCIUM 8.8* 8.5* 8.9 8.6* 8.9   Liver Function Tests: Recent Labs  Lab 12/08/18 0605  AST 17  ALT 12  ALKPHOS 44  BILITOT 0.9  PROT 5.3*  ALBUMIN 3.0*   No results for input(s): LIPASE, AMYLASE in the last 168 hours. No results for input(s): AMMONIA in the last 168 hours. CBC: Recent Labs  Lab 12/06/18 0717 12/07/18 0837 12/09/18 0524 12/10/18 0252 12/11/18 0252  WBC 3.0* 3.4* 3.1* 3.9* 2.6*  NEUTROABS 2.0  --  2.0  --   --  HGB 10.1* 10.5* 9.2* 8.8* 7.4*  HCT 30.9* 31.8* 28.1* 27.6* 22.9*  MCV 90.6 91.1 91.8 93.9 94.2  PLT 42* 42* 41* 45* 38*   Cardiac Enzymes: No results for input(s): CKTOTAL, CKMB, CKMBINDEX, TROPONINI in the last 168 hours. BNP: Invalid input(s): POCBNP CBG: Recent Labs  Lab 12/10/18 0643 12/10/18 1117 12/10/18 1613 12/10/18 2152 12/11/18 0635  GLUCAP 87 86 96 88 82   D-Dimer No results for input(s): DDIMER in the last 72 hours. Hgb A1c Recent Labs    12/09/18 2014  HGBA1C 5.3   Lipid Profile No results for input(s): CHOL, HDL, LDLCALC, TRIG, CHOLHDL, LDLDIRECT in the last 72 hours. Thyroid function studies No results for input(s): TSH, T4TOTAL, T3FREE, THYROIDAB in the last 72 hours.  Invalid input(s): FREET3 Anemia work up No results for input(s): VITAMINB12, FOLATE, FERRITIN, TIBC, IRON, RETICCTPCT in the last 72 hours. Urinalysis    Component Value Date/Time   COLORURINE YELLOW 11/28/2018 1402   APPEARANCEUR CLOUDY (A) 11/28/2018 1402   APPEARANCEUR Clear 10/15/2011 1143   LABSPEC 1.010 11/28/2018 1402   LABSPEC 1.004 10/15/2011 1143   PHURINE 5.0 11/28/2018 1402   GLUCOSEU NEGATIVE 11/28/2018 1402   GLUCOSEU Negative 10/15/2011 1143   HGBUR SMALL (A) 11/28/2018 1402   BILIRUBINUR NEGATIVE 11/28/2018 1402   BILIRUBINUR Negative 10/15/2011 [place] 11/28/2018 1402   PROTEINUR 30 (A) 11/28/2018 1402   NITRITE NEGATIVE 11/28/2018 1402   LEUKOCYTESUR LARGE (A) 11/28/2018 1402   LEUKOCYTESUR  Trace 10/15/2011 1143   Sepsis Labs Invalid input(s): PROCALCITONIN,  WBC,  LACTICIDVEN Microbiology No results found for this or any previous visit (from the past 240 hour(s)).   Time coordinating discharge: 35 minutes  SIGNED:   Antonieta Pert, MD  [facility] 12/11/2018, 10:47 AM  If 7PM-7AM, please contact night-coverage [email or web address]

## 2018-12-11 NOTE — Care Management Important Message (Signed)
Important Message  Patient Details  Name: Stacy Cook MRN: [id number] Date of Birth: Sep 26, 1935   Medicare Important Message Given:  Yes     [name] 12/11/2018, 11:36 AM

## 2018-12-11 NOTE — Evaluation (Signed)
Occupational Therapy Evaluation Patient Details Name: Stacy Cook MRN: [id number] DOB: 03-24-1936 Today's Date: 12/11/2018    History of Present Illness 83 year old female with history of stage IV lung cancer,, malignant pleural effusion, severe aortic stenosis, underwent TAVR on 8/25. Post TAVR her hospital course was complicated by embolic stroke, she had weakness of her right arm leg and face-MRI noted left anterior pontine and left frontal 2-3 cortical punctate infarcts this is felt to be a complication of TAVR.  Seizure like activity in CIR, transferred back to Acute hospital and had PPM placed 9/1.   Clinical Impression   Pt admitted with above and presents to OT with impairments impacting ability to complete ADLs at [facility].  Spoke with RN who reports pt stable at this time and monitor closely with mobility.  Sit > stand min assist from recliner with assist to place RUE on RW.  Mod assist short distance ambulation and stand pivot transfers with blocking and assist to RLE.  Pt continues with Rt trace shoulder but then no active elbow or wrist movement.  Pt very motivated to participate in therapy and hopeful to return to CIR for further intensive therapies.  This therapist recommends CIR for further intensive therapies, will continue to follow acutely until d/c to next venue.    Follow Up Recommendations  CIR    Equipment Recommendations  Other (comment)(defer to next venue)    Recommendations for Other Services Rehab consult     Precautions / Restrictions Precautions Precautions: Fall Restrictions Weight Bearing Restrictions: No      Mobility Bed Mobility               General bed mobility comments: in recliner on arrival  Transfers Overall transfer level: Needs assistance Equipment used: Rolling walker (2 wheeled) Transfers: Sit to/from [facility] Sit to Stand: Min assist Stand pivot transfers: Min assist;Mod assist       General transfer  comment: Initial stand from chair with RLE blocked and min assist. Min-mod assist when assisting with advancement of RLE        ADL either performed or assessed with clinical judgement   ADL Overall ADL's : Needs assistance/impaired Eating/Feeding: Minimal assistance;Sitting   Grooming: Minimal assistance;Sitting   Upper Body Bathing: Moderate assistance;Sitting   Lower Body Bathing: Maximal assistance;Sit to/from stand Lower Body Bathing Details (indicate cue type and reason): pt very fearful with bending forward to reach towards feet Upper Body Dressing : Moderate assistance;Sitting   Lower Body Dressing: Total assistance;Sit to/from stand   Toilet Transfer: Moderate assistance;Stand-pivot;RW           Functional mobility during ADLs: Moderate assistance;Rolling walker General ADL Comments: Min assist sit > stand, mod assist to manage RLE with short distance ambulation with RW     Vision Baseline Vision/History: Wears glasses Wears Glasses: Reading only Patient Visual Report: No change from baseline Vision Assessment?: Yes Eye Alignment: Within Functional Limits Ocular Range of Motion: Within Functional Limits Alignment/Gaze Preference: Within Defined Limits Tracking/Visual Pursuits: Able to track stimulus in all quads without difficulty Saccades: Within functional limits            Pertinent Vitals/Pain Pain Assessment: No/denies pain     Hand Dominance Right   Extremity/Trunk Assessment Upper Extremity Assessment Upper Extremity Assessment: RUE deficits/detail RUE Deficits / Details: 1/5 shoulder and elbow, no forearm, wrist movement RUE Sensation: decreased light touch;decreased proprioception RUE Coordination: decreased fine motor;decreased gross motor   Lower Extremity Assessment Lower Extremity Assessment: Defer to  PT evaluation       Communication Communication Communication: No difficulties   Cognition Arousal/Alertness: Awake/alert Behavior  During Therapy: WFL for tasks assessed/performed Overall Cognitive Status: Within Functional Limits for tasks assessed                                                Home Living Family/patient expects to be discharged to:: Inpatient rehab Living Arrangements: Alone Available Help at Discharge: Family;Available PRN/intermittently Type of Home: House Home Access: Stairs to enter [employer] of Steps: 4 Entrance Stairs-Rails: None(reports son plans to install rails) Home Layout: One level     Bathroom Shower/Tub: Teacher, early years/pre: Standard Bathroom Accessibility: Yes How Accessible: Accessible via walker Home Equipment: Hand held shower head      Lives With: Alone    Prior Functioning/Environment Level of Independence: Independent    ADL's / Homemaking Assistance Needed: Completes all ADLs indepenently, drives   Comments: enjoys scrapbooking, Conservation officer, historic buildings        OT Problem List: Decreased strength;Decreased activity tolerance;Impaired balance (sitting and/or standing);Decreased coordination;Decreased knowledge of use of DME or AE;Impaired sensation;Impaired UE functional use;Decreased range of motion;Cardiopulmonary status limiting activity      OT Treatment/Interventions: Self-care/ADL training;DME and/or AE instruction;Balance training;Patient/family education;Neuromuscular education;Therapeutic activities    OT Goals(Current goals can be found in the care plan section) Acute Rehab OT Goals Patient Stated Goal: get back to CIR OT Goal Formulation: With patient Time For Goal Achievement: 12/25/18 Potential to Achieve Goals: Good  OT Frequency: Min 3X/week    AM-PAC OT "6 Clicks" Daily Activity     Outcome Measure Help from another person eating meals?: A Little Help from another person taking care of personal grooming?: A Little Help from another person toileting, which includes using toliet, bedpan, or urinal?: A  Lot Help from another person bathing (including washing, rinsing, drying)?: A Lot Help from another person to put on and taking off regular upper body clothing?: A Lot Help from another person to put on and taking off regular lower body clothing?: Total 6 Click Score: 13   End of Session Equipment Utilized During Treatment: Gait belt;Rolling walker Nurse Communication: Mobility status  Activity Tolerance: Patient tolerated treatment well Patient left: in chair;with call bell/phone within reach  OT Visit Diagnosis: Unsteadiness on feet (R26.81);Hemiplegia and hemiparesis;Muscle weakness (generalized) (M62.81) Hemiplegia - Right/Left: Right Hemiplegia - dominant/non-dominant: Dominant Hemiplegia - caused by: Cerebral infarction                Time: 3244-0102 OT Time Calculation (min): 24 min Charges:  OT General Charges $OT Visit: 1 Visit OT Evaluation $OT Eval Moderate Complexity: 1 Mod OT Treatments $Self Care/Home Management : 8-22 mins   [place], [place] 12/11/2018, 9:23 AM

## 2018-12-11 NOTE — Progress Notes (Signed)
Inpatient Rehab Admissions:  Inpatient Rehab Consult received.  I met with patient and her son at the bedside for rehabilitation assessment and to discuss goals and expectations of an inpatient rehab admission.  Pt and son open for re-admission to CIR.  I have opened for insurance authorization and will follow for possible admission tomorrow pending approval.   Signed: Shann Medal, PT, DPT Admissions Coordinator [phone] 12/11/18  5:09 PM

## 2018-12-12 ENCOUNTER — Inpatient Hospital Stay (HOSPITAL_COMMUNITY)
Admission: RE | Admit: 2018-12-12 | Discharge: 2018-12-27 | DRG: 057 | Disposition: A | Discharge status: Home Health | Payer: Medicare Other | Source: Intra-hospital | Attending: Physical Medicine & Rehabilitation | Admitting: Physical Medicine & Rehabilitation

## 2018-12-12 ENCOUNTER — Other Ambulatory Visit: Payer: Self-pay

## 2018-12-12 DIAGNOSIS — M62838 Other muscle spasm: Secondary | ICD-10-CM | POA: Diagnosis not present

## 2018-12-12 DIAGNOSIS — I350 Nonrheumatic aortic (valve) stenosis: Secondary | ICD-10-CM | POA: Diagnosis not present

## 2018-12-12 DIAGNOSIS — G8191 Hemiplegia, unspecified affecting right dominant side: Secondary | ICD-10-CM | POA: Diagnosis not present

## 2018-12-12 DIAGNOSIS — I6340 Cerebral infarction due to embolism of unspecified cerebral artery: Secondary | ICD-10-CM

## 2018-12-12 DIAGNOSIS — D61818 Other pancytopenia: Secondary | ICD-10-CM | POA: Diagnosis present

## 2018-12-12 DIAGNOSIS — K219 Gastro-esophageal reflux disease without esophagitis: Secondary | ICD-10-CM | POA: Diagnosis present

## 2018-12-12 DIAGNOSIS — I6319 Cerebral infarction due to embolism of other precerebral artery: Secondary | ICD-10-CM | POA: Diagnosis not present

## 2018-12-12 DIAGNOSIS — Z90710 Acquired absence of both cervix and uterus: Secondary | ICD-10-CM

## 2018-12-12 DIAGNOSIS — E785 Hyperlipidemia, unspecified: Secondary | ICD-10-CM | POA: Diagnosis present

## 2018-12-12 DIAGNOSIS — S301XXD Contusion of abdominal wall, subsequent encounter: Secondary | ICD-10-CM | POA: Diagnosis not present

## 2018-12-12 DIAGNOSIS — C3431 Malignant neoplasm of lower lobe, right bronchus or lung: Secondary | ICD-10-CM | POA: Diagnosis present

## 2018-12-12 DIAGNOSIS — D6481 Anemia due to antineoplastic chemotherapy: Secondary | ICD-10-CM | POA: Diagnosis present

## 2018-12-12 DIAGNOSIS — R64 Cachexia: Secondary | ICD-10-CM | POA: Diagnosis present

## 2018-12-12 DIAGNOSIS — D696 Thrombocytopenia, unspecified: Secondary | ICD-10-CM | POA: Diagnosis present

## 2018-12-12 DIAGNOSIS — D701 Agranulocytosis secondary to cancer chemotherapy: Secondary | ICD-10-CM | POA: Diagnosis not present

## 2018-12-12 DIAGNOSIS — I442 Atrioventricular block, complete: Secondary | ICD-10-CM | POA: Diagnosis not present

## 2018-12-12 DIAGNOSIS — Z950 Presence of cardiac pacemaker: Secondary | ICD-10-CM

## 2018-12-12 DIAGNOSIS — N183 Chronic kidney disease, stage 3 (moderate): Secondary | ICD-10-CM | POA: Diagnosis present

## 2018-12-12 DIAGNOSIS — D649 Anemia, unspecified: Secondary | ICD-10-CM

## 2018-12-12 DIAGNOSIS — J910 Malignant pleural effusion: Secondary | ICD-10-CM | POA: Diagnosis present

## 2018-12-12 DIAGNOSIS — E119 Type 2 diabetes mellitus without complications: Secondary | ICD-10-CM | POA: Diagnosis present

## 2018-12-12 DIAGNOSIS — Z882 Allergy status to sulfonamides status: Secondary | ICD-10-CM

## 2018-12-12 DIAGNOSIS — Z952 Presence of prosthetic heart valve: Secondary | ICD-10-CM

## 2018-12-12 DIAGNOSIS — I63432 Cerebral infarction due to embolism of left posterior cerebral artery: Secondary | ICD-10-CM | POA: Diagnosis not present

## 2018-12-12 DIAGNOSIS — E1122 Type 2 diabetes mellitus with diabetic chronic kidney disease: Secondary | ICD-10-CM | POA: Diagnosis present

## 2018-12-12 DIAGNOSIS — K5900 Constipation, unspecified: Secondary | ICD-10-CM | POA: Diagnosis present

## 2018-12-12 DIAGNOSIS — F419 Anxiety disorder, unspecified: Secondary | ICD-10-CM | POA: Diagnosis present

## 2018-12-12 DIAGNOSIS — Z888 Allergy status to other drugs, medicaments and biological substances status: Secondary | ICD-10-CM

## 2018-12-12 DIAGNOSIS — I130 Hypertensive heart and chronic kidney disease with heart failure and stage 1 through stage 4 chronic kidney disease, or unspecified chronic kidney disease: Secondary | ICD-10-CM | POA: Diagnosis present

## 2018-12-12 DIAGNOSIS — D638 Anemia in other chronic diseases classified elsewhere: Secondary | ICD-10-CM | POA: Diagnosis present

## 2018-12-12 DIAGNOSIS — I2510 Atherosclerotic heart disease of native coronary artery without angina pectoris: Secondary | ICD-10-CM | POA: Diagnosis present

## 2018-12-12 DIAGNOSIS — I10 Essential (primary) hypertension: Secondary | ICD-10-CM | POA: Diagnosis not present

## 2018-12-12 DIAGNOSIS — Z903 Acquired absence of stomach [part of]: Secondary | ICD-10-CM

## 2018-12-12 DIAGNOSIS — N179 Acute kidney failure, unspecified: Secondary | ICD-10-CM | POA: Diagnosis present

## 2018-12-12 DIAGNOSIS — R569 Unspecified convulsions: Secondary | ICD-10-CM | POA: Diagnosis not present

## 2018-12-12 DIAGNOSIS — D631 Anemia in chronic kidney disease: Secondary | ICD-10-CM | POA: Diagnosis present

## 2018-12-12 DIAGNOSIS — Z7902 Long term (current) use of antithrombotics/antiplatelets: Secondary | ICD-10-CM

## 2018-12-12 DIAGNOSIS — Z6820 Body mass index (BMI) 20.0-20.9, adult: Secondary | ICD-10-CM

## 2018-12-12 DIAGNOSIS — I5032 Chronic diastolic (congestive) heart failure: Secondary | ICD-10-CM | POA: Diagnosis present

## 2018-12-12 DIAGNOSIS — M810 Age-related osteoporosis without current pathological fracture: Secondary | ICD-10-CM | POA: Diagnosis present

## 2018-12-12 DIAGNOSIS — Z87442 Personal history of urinary calculi: Secondary | ICD-10-CM

## 2018-12-12 DIAGNOSIS — E875 Hyperkalemia: Secondary | ICD-10-CM | POA: Diagnosis present

## 2018-12-12 DIAGNOSIS — I69392 Facial weakness following cerebral infarction: Secondary | ICD-10-CM | POA: Diagnosis not present

## 2018-12-12 DIAGNOSIS — D709 Neutropenia, unspecified: Secondary | ICD-10-CM | POA: Diagnosis not present

## 2018-12-12 DIAGNOSIS — R0989 Other specified symptoms and signs involving the circulatory and respiratory systems: Secondary | ICD-10-CM

## 2018-12-12 DIAGNOSIS — I69322 Dysarthria following cerebral infarction: Secondary | ICD-10-CM | POA: Diagnosis not present

## 2018-12-12 DIAGNOSIS — Z887 Allergy status to serum and vaccine status: Secondary | ICD-10-CM

## 2018-12-12 DIAGNOSIS — I69351 Hemiplegia and hemiparesis following cerebral infarction affecting right dominant side: Principal | ICD-10-CM

## 2018-12-12 DIAGNOSIS — I639 Cerebral infarction, unspecified: Secondary | ICD-10-CM | POA: Diagnosis present

## 2018-12-12 DIAGNOSIS — Z954 Presence of other heart-valve replacement: Secondary | ICD-10-CM | POA: Diagnosis not present

## 2018-12-12 DIAGNOSIS — Z833 Family history of diabetes mellitus: Secondary | ICD-10-CM

## 2018-12-12 DIAGNOSIS — Z79899 Other long term (current) drug therapy: Secondary | ICD-10-CM

## 2018-12-12 DIAGNOSIS — T451X5A Adverse effect of antineoplastic and immunosuppressive drugs, initial encounter: Secondary | ICD-10-CM | POA: Diagnosis present

## 2018-12-12 DIAGNOSIS — Z955 Presence of coronary angioplasty implant and graft: Secondary | ICD-10-CM

## 2018-12-12 DIAGNOSIS — I110 Hypertensive heart disease with heart failure: Secondary | ICD-10-CM | POA: Diagnosis present

## 2018-12-12 DIAGNOSIS — Z85828 Personal history of other malignant neoplasm of skin: Secondary | ICD-10-CM

## 2018-12-12 DIAGNOSIS — Z7982 Long term (current) use of aspirin: Secondary | ICD-10-CM

## 2018-12-12 LAB — GLUCOSE, CAPILLARY
Glucose-Capillary: 128 mg/dL — ABNORMAL HIGH (ref 70–99)
Glucose-Capillary: 111 mg/dL — ABNORMAL HIGH (ref 70–99)
Glucose-Capillary: 138 mg/dL — ABNORMAL HIGH (ref 70–99)
Glucose-Capillary: 102 mg/dL — ABNORMAL HIGH (ref 70–99)

## 2018-12-12 MED ORDER — CLOPIDOGREL BISULFATE 75 MG PO TABS
75.0000 mg | ORAL_TABLET | Freq: Every day | ORAL | Status: DC
  Administered 2018-12-13 – 2018-12-27 (×15): 75 mg via ORAL
  Filled 2018-12-12 (×16): qty 1

## 2018-12-12 MED ORDER — BISACODYL 10 MG RE SUPP: 10.0000 mg | Freq: Every day | RECTAL | Status: DC | PRN

## 2018-12-12 MED ORDER — ALUM & MAG HYDROXIDE-SIMETH 200-200-20 MG/5ML PO SUSP: 30.0000 mL | ORAL | Status: DC | PRN

## 2018-12-12 MED ORDER — PROCHLORPERAZINE MALEATE 5 MG PO TABS: 5.0000 mg | ORAL_TABLET | Freq: Four times a day (QID) | ORAL | Status: DC | PRN

## 2018-12-12 MED ORDER — ASPIRIN 81 MG PO CHEW
81.0000 mg | CHEWABLE_TABLET | Freq: Every day | ORAL | Status: DC
  Administered 2018-12-13 – 2018-12-27 (×15): 81 mg via ORAL
  Filled 2018-12-12 (×15): qty 1

## 2018-12-12 MED ORDER — LEVETIRACETAM 500 MG PO TABS
500.0000 mg | ORAL_TABLET | Freq: Two times a day (BID) | ORAL | Status: DC
  Administered 2018-12-12 – 2018-12-27 (×30): 500 mg via ORAL
  Filled 2018-12-12 (×30): qty 1

## 2018-12-12 MED ORDER — TIZANIDINE HCL 2 MG PO TABS
2.0000 mg | ORAL_TABLET | Freq: Three times a day (TID) | ORAL | Status: DC | PRN
  Administered 2018-12-12 – 2018-12-16 (×8): 2 mg via ORAL
  Filled 2018-12-12 (×8): qty 1

## 2018-12-12 MED ORDER — TRAZODONE HCL 50 MG PO TABS
25.0000 mg | ORAL_TABLET | Freq: Every evening | ORAL | Status: DC | PRN
  Administered 2018-12-16 – 2018-12-18 (×3): 50 mg via ORAL
  Filled 2018-12-12 (×4): qty 1

## 2018-12-12 MED ORDER — PROCHLORPERAZINE 25 MG RE SUPP: 12.5000 mg | Freq: Four times a day (QID) | RECTAL | Status: DC | PRN

## 2018-12-12 MED ORDER — INSULIN ASPART 100 UNIT/ML ~~LOC~~ SOLN: 0.0000 [IU] | Freq: Three times a day (TID) | SUBCUTANEOUS | Status: DC

## 2018-12-12 MED ORDER — POLYETHYLENE GLYCOL 3350 17 G PO PACK: 17.0000 g | PACK | Freq: Every day | ORAL | Status: DC | PRN

## 2018-12-12 MED ORDER — DIPHENHYDRAMINE HCL 12.5 MG/5ML PO ELIX: 12.5000 mg | ORAL_SOLUTION | Freq: Four times a day (QID) | ORAL | Status: DC | PRN

## 2018-12-12 MED ORDER — METOPROLOL SUCCINATE ER 50 MG PO TB24
50.0000 mg | ORAL_TABLET | Freq: Every evening | ORAL | Status: DC
  Administered 2018-12-12 – 2018-12-26 (×15): 50 mg via ORAL
  Filled 2018-12-12 (×15): qty 1

## 2018-12-12 MED ORDER — OSIMERTINIB MESYLATE 80 MG PO TABS
80.0000 mg | ORAL_TABLET | Freq: Every evening | ORAL | Status: DC
  Administered 2018-12-12 – 2018-12-26 (×15): 80 mg via ORAL
  Filled 2018-12-12 (×17): qty 1

## 2018-12-12 MED ORDER — FLEET ENEMA 7-19 GM/118ML RE ENEM: 1.0000 | ENEMA | Freq: Once | RECTAL | Status: DC | PRN

## 2018-12-12 MED ORDER — ISOSORBIDE MONONITRATE ER 30 MG PO TB24: 30.0000 mg | ORAL_TABLET | Freq: Every day | ORAL | Status: DC

## 2018-12-12 MED ORDER — PROCHLORPERAZINE EDISYLATE 10 MG/2ML IJ SOLN: 5.0000 mg | Freq: Four times a day (QID) | INTRAMUSCULAR | Status: DC | PRN

## 2018-12-12 MED ORDER — GUAIFENESIN-DM 100-10 MG/5ML PO SYRP: 5.0000 mL | ORAL_SOLUTION | Freq: Four times a day (QID) | ORAL | Status: DC | PRN

## 2018-12-12 MED ORDER — FERROUS SULFATE 325 (65 FE) MG PO TABS
325.0000 mg | ORAL_TABLET | Freq: Every day | ORAL | Status: DC
  Administered 2018-12-13 – 2018-12-17 (×5): 325 mg via ORAL
  Filled 2018-12-12 (×5): qty 1

## 2018-12-12 MED ORDER — ISOSORBIDE MONONITRATE ER 30 MG PO TB24
30.0000 mg | ORAL_TABLET | Freq: Every day | ORAL | Status: DC
  Administered 2018-12-13 – 2018-12-27 (×15): 30 mg via ORAL
  Filled 2018-12-12 (×15): qty 1

## 2018-12-12 MED ORDER — ACETAMINOPHEN 325 MG PO TABS
325.0000 mg | ORAL_TABLET | ORAL | Status: DC | PRN
  Administered 2018-12-13 – 2018-12-14 (×2): 650 mg via ORAL
  Filled 2018-12-12 (×3): qty 2

## 2018-12-12 MED ORDER — SENNA 8.6 MG PO TABS
1.0000 | ORAL_TABLET | Freq: Every day | ORAL | Status: DC
  Administered 2018-12-12: 8.6 mg via ORAL
  Filled 2018-12-12 (×2): qty 1

## 2018-12-12 NOTE — Progress Notes (Signed)
Patient ID: Stacy Cook, female   DOB: 31-May-1935, 83 y.o.   MRN: [id number]  Admitted to unit, reviewed medications, rehab therapy schedule and plan of care. States an understanding of information reviewed. Margarito Liner
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
## 2018-12-12 NOTE — Progress Notes (Signed)
Initial Nutrition Assessment  DOCUMENTATION CODES:   Not applicable  INTERVENTION:  -Ensure Enlive po BID, each supplement provides 350 kcal and 20 grams of protein -Magic cup TID with meals, each supplement provides 290 kcal and 9 grams of protein -Prostat 30 ml po BID, each supplement provides 100 kcal and 15 grams protein -MVI daily   NUTRITION DIAGNOSIS:   Inadequate oral intake related to poor appetite as evidenced by meal completion < 50%.   GOAL:   Patient will meet greater than or equal to 90% of their needs   MONITOR:   PO intake, Weight trends, Supplement acceptance, I & O's, Labs  REASON FOR ASSESSMENT:   Malnutrition Screening Tool    ASSESSMENT:  RD working remotely.  83 year old female with medical history significant of DM, thrombocytopenia, aortic stenosis, s/p TVAR 8/25, osteoporosis, neuroendocrine tumor, neuritis, radiculitis d/t rupture of intervertebral disc, HTN, HLD, carotid stenosis, B12 deficiency, carcinoma in situ of stomach, CAD, anemia, recent left frontal pontine stroke on 8/25 with d/c to CIR. Patient had unresponsive episode associated with seizure 9/1 and readmitted to obtain pacemaker s/p 4 additional episodes of cardiac pauses lasting 10 seconds  Patient has been discharged from hospital; pending re-admission to [facility] today Spoke with very delightful patient via phone this afternoon. She reports feeling fair and ready to "get moving with rehab so I can go home" Patient endorses poor appetite and intake; reports this is normal for her. Patient stated that she had half of her stomach removed due to cancer and "eats more like a rabbit ever since"   When at home patient reports cranberry juice and medications in the morning, when she feels a little hungry she will have half of a [place] sandwich and will eat the other half for dinner around 5-6.   She reports that she does not care for veggies and is a "meat and potatoes girl" reports enjoying  facility pot roast a couple of times during admission. RD educated on food as the body's source of energy and increased needs for rehab; offered snacks in between meals and encouraged continued efforts of po at meals. Patient agreeable to vanilla magic cup with meals, Prostat BID, and continuing intake of vanilla ensure.  Patient eating 10-25% of meals at this time; meal refusal noted this morning  Current wt 52 kg (114.4 lb) Weight history reviewed: 109 -121 lbs over the past 3 months  NUTRITION - FOCUSED PHYSICAL EXAM: Unable to complete at this time   Diet Order:   Diet Order            Diet regular Room service appropriate? Yes with Assist; Fluid consistency: Thin  Diet effective now        Diet - low sodium heart healthy              EDUCATION NEEDS:   Education needs have been addressed  Skin:  Skin Assessment: Reviewed RN Assessment  Last BM:  9/2  Height:   Ht Readings from Last 1 Encounters:  12/10/18 5\' 1"  (1.549 m)    Weight:   Wt Readings from Last 1 Encounters:  12/09/18 52 kg    Ideal Body Weight:  47.7 kg  BMI:  Body mass index is 21.66 kg/m.  Estimated Nutritional Needs:   Kcal:  1400-1600  Protein:  80-88  Fluid:  >1.4L/day   Lajuan Lines, RD, LDN [facility] Telephone [phone] After Hours/Weekend Pager: [phone]

## 2018-12-12 NOTE — Progress Notes (Signed)
Inpatient Rehab Admissions Coordinator:   I have insurance authorization and approval from Dr. Wynelle Cleveland for pt to admit to CIR today.  I will let pt/family, and CM know.   Shann Medal, PT, DPT Admissions Coordinator [phone] 12/12/18  4:02 PM

## 2018-12-13 ENCOUNTER — Inpatient Hospital Stay (HOSPITAL_COMMUNITY): Payer: Medicare Other | Admitting: Speech Pathology

## 2018-12-13 ENCOUNTER — Inpatient Hospital Stay (HOSPITAL_COMMUNITY): Payer: Medicare Other | Admitting: Occupational Therapy

## 2018-12-13 ENCOUNTER — Inpatient Hospital Stay (HOSPITAL_COMMUNITY): Payer: Medicare Other | Admitting: Physical Therapy

## 2018-12-13 DIAGNOSIS — M62838 Other muscle spasm: Secondary | ICD-10-CM

## 2018-12-13 DIAGNOSIS — D709 Neutropenia, unspecified: Secondary | ICD-10-CM

## 2018-12-13 DIAGNOSIS — G8191 Hemiplegia, unspecified affecting right dominant side: Secondary | ICD-10-CM

## 2018-12-13 DIAGNOSIS — I63432 Cerebral infarction due to embolism of left posterior cerebral artery: Secondary | ICD-10-CM

## 2018-12-13 LAB — CBC WITH DIFFERENTIAL/PLATELET
Abs Immature Granulocytes: 0.01 10*3/uL (ref 0.00–0.07)
Basophils Absolute: 0 10*3/uL (ref 0.0–0.1)
Basophils Relative: 0 %
Eosinophils Absolute: 0.1 10*3/uL (ref 0.0–0.5)
Eosinophils Relative: 4 %
HCT: 22.1 % — ABNORMAL LOW (ref 36.0–46.0)
Hemoglobin: 7.3 g/dL — ABNORMAL LOW (ref 12.0–15.0)
Immature Granulocytes: 0 %
Lymphocytes Relative: 23 %
Lymphs Abs: 0.5 10*3/uL — ABNORMAL LOW (ref 0.7–4.0)
MCH: 30.5 pg (ref 26.0–34.0)
MCHC: 33 g/dL (ref 30.0–36.0)
MCV: 92.5 fL (ref 80.0–100.0)
Monocytes Absolute: 0.2 10*3/uL (ref 0.1–1.0)
Monocytes Relative: 9 %
Neutro Abs: 1.5 10*3/uL — ABNORMAL LOW (ref 1.7–7.7)
Neutrophils Relative %: 64 %
Platelets: 40 10*3/uL — ABNORMAL LOW (ref 150–400)
RBC: 2.39 MIL/uL — ABNORMAL LOW (ref 3.87–5.11)
RDW: 12.9 % (ref 11.5–15.5)
WBC: 2.3 10*3/uL — ABNORMAL LOW (ref 4.0–10.5)
nRBC: 0 % (ref 0.0–0.2)

## 2018-12-13 LAB — COMPREHENSIVE METABOLIC PANEL
ALT: 14 U/L (ref 0–44)
AST: 20 U/L (ref 15–41)
Albumin: 3 g/dL — ABNORMAL LOW (ref 3.5–5.0)
Alkaline Phosphatase: 56 U/L (ref 38–126)
Anion gap: 9 (ref 5–15)
BUN: 37 mg/dL — ABNORMAL HIGH (ref 8–23)
CO2: 23 mmol/L (ref 22–32)
Calcium: 8.8 mg/dL — ABNORMAL LOW (ref 8.9–10.3)
Chloride: 110 mmol/L (ref 98–111)
Creatinine, Ser: 1.33 mg/dL — ABNORMAL HIGH (ref 0.44–1.00)
GFR calc Af Amer: 44 mL/min — ABNORMAL LOW (ref >60)
GFR calc non Af Amer: 38 mL/min — ABNORMAL LOW (ref >60)
Glucose, Bld: 109 mg/dL — ABNORMAL HIGH (ref 70–99)
Potassium: 4.4 mmol/L (ref 3.5–5.1)
Sodium: 142 mmol/L (ref 135–145)
Total Bilirubin: 0.9 mg/dL (ref 0.3–1.2)
Total Protein: 5.1 g/dL — ABNORMAL LOW (ref 6.5–8.1)

## 2018-12-13 LAB — GLUCOSE, CAPILLARY
Glucose-Capillary: 91 mg/dL (ref 70–99)
Glucose-Capillary: 94 mg/dL (ref 70–99)
Glucose-Capillary: 101 mg/dL — ABNORMAL HIGH (ref 70–99)
Glucose-Capillary: 100 mg/dL — ABNORMAL HIGH (ref 70–99)
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
## 2018-12-13 NOTE — Plan of Care (Signed)
  Problem: Consults Goal: RH STROKE PATIENT EDUCATION Description: See Patient Education module for education specifics  Outcome: Progressing   Problem: RH KNOWLEDGE DEFICIT Goal: RH STG INCREASE KNOWLEDGE OF HYPERTENSION Description: Pt will be able to manage HTN using medications and dietary restrictions following handouts and educational information independently Outcome: Progressing Goal: RH STG INCREASE KNOWLEGDE OF HYPERLIPIDEMIA Description:  Pt will be able to manage HLD using medications and dietary restrictions following handouts and educational information independently Outcome: Progressing Goal: RH STG INCREASE KNOWLEDGE OF STROKE PROPHYLAXIS Description: Pt will be able to manage secondary stroke prevention using handouts and educational information independently Outcome: Progressing   Problem: RH BOWEL ELIMINATION Goal: RH STG MANAGE BOWEL WITH ASSISTANCE Description: STG Manage Bowel with  mod I Assistance. Outcome: Not Progressing
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
## 2018-12-13 NOTE — Progress Notes (Signed)
[place] PHYSICAL MEDICINE & REHABILITATION PROGRESS NOTE   Subjective/Complaints:  Pt reports having leg spasms/cramps on R side- even when son just touched leg, she felt like he was squeezing it badly.  Wondering if can treat the cramping/pain..  Objective:   No results found. Recent Labs    12/11/18 0252 12/13/18 0456  WBC 2.6* 2.3*  HGB 7.4* 7.3*  HCT 22.9* 22.1*  PLT 38* 40*   Recent Labs    12/11/18 0252 12/13/18 0456  NA 141 142  K 4.9 4.4  CL 111 110  CO2 20* 23  GLUCOSE 85 109*  BUN 34* 37*  CREATININE 1.55* 1.33*  CALCIUM 8.9 8.8*    Intake/Output Summary (Last 24 hours) at 12/13/2018 1026 Last data filed at 12/13/2018 0926 Gross per 24 hour  Intake 580 ml  Output -  Net 580 ml     Physical Exam: Vital Signs Blood pressure (!) 151/41, pulse 63, temperature 98.1 F (36.7 C), temperature source Oral, resp. rate 18, height 5\' 1"  (1.549 m), weight 57.4 kg, SpO2 99 %.  Nursing note, labsand vitalsreviewed. Constitutional: awake, alert, appropriate, sitting up in manual w/c, . She has OT in room- just did hair (from electrodes from seizures)appearscachectic. Frail appearing female with resolving ecchymosis right forehead and small hematoma right upper lip. HENT:  Head:Normocephalicand atraumatic.  Eyes:EOMare normal.  Neck:Normal range of motion.No tracheal deviationpresent. No thyromegalypresent.  Cardiovascular:normal rate Respiratory:CTA B/L.  LD:JTTS. ND, NT Musculoskeletal:Normal range of motion.  General: No deformityor edema.  Comments: Bilateral knees with multiple resolving yellowish bruises. Neurological: She is alertand oriented to person, place, and time. Right facial weakness with mild dysarthria. RUE tr biceps, wrist and finger flexion. RLE 1-2/5. Senses pain and light touch in all 4. Fair insight and awareness. Normal language. Follows all simple commands. Skin: Skin iswarmand dry. She isnot  diaphoretic. Petechiae on left forearm. Diffuse ecchymosis left groin--no hematoma appreciated. Minimal ecchymosis right groin with healing puncture/laceration. Psychiatric: She has a normal mood and affect. Herbehavior is normal.     Assessment/Plan: 1. 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  Physiatrist is providing close team supervision and 24 hour management of active medical problems listed below.  Physiatrist and rehab team continue to assess barriers to discharge/monitor patient progress toward functional and medical goals  Care Tool:  Bathing              Bathing assist       Upper Body Dressing/Undressing Upper body dressing        Upper body assist      Lower Body Dressing/Undressing Lower body dressing            Lower body assist       Toileting Toileting    Toileting assist Assist for toileting: Moderate Assistance - Patient 50 - 74%     Transfers Chair/bed transfer  Transfers assist           Locomotion Ambulation   Ambulation assist              Walk 10 feet activity   Assist           Walk 50 feet activity   Assist           Walk 150 feet activity   Assist  Walk 10 feet on uneven surface  activity   Assist           Wheelchair     Assist               Wheelchair 50 feet with 2 turns activity    Assist            Wheelchair 150 feet activity     Assist          Blood pressure (!) 151/41, pulse 63, temperature 98.1 F (36.7 C), temperature source Oral, resp. rate 18, height 5\' 1"  (1.549 m), weight 57.4 kg, SpO2 99 %.   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 Pt required  transfer to acute d/t seizures and heart block -admit to inpatient rehab 2. Antithrombotics: -DVT/anticoagulation:Mechanical:Sequential compression devices, below kneeBilateral lower extremitiesdue to low platelets. -antiplatelet therapy: DAPTwith ASA and plavix 3. Pain Management:tylenol prn. 4. Mood:LCSW to follow for evaluation and support. -antipsychotic agents: N/A 5. Neuropsych: This patientiscapable of making decisions on herown behalf. 6. Skin/Wound Care:Monitor groin incisions for healing. Monitor right groin hematoma. 7. Fluids/Electrolytes/Nutrition:Monitor I/O. Check lytes in am. 8. TAVR/CHB s/p PPM: On ASA/Plavix due to recent TAVR.  9. New onset focal motor seizures: Continue Keppra bid X 3 months. Follow up with Dr. Felecia Shelling after discharge.  11. Constipation:scheduled senna-s at hs 12. Acute on chronic anemia: Monitor for signs of bleeding. Monitor H/H trend has been around 7-8 range overall. Most recent check 7.4-->may need transfusion if </= 7.0.   9/5- Hb 7.3- pt was able to get up/move around with therapy 13. Acute on chronic renal failure: Hyperkalemia resolved--recheck labs in am.ReportsSCr1.9 one month PTA.  9/5- Cr 1.33 down from 1.55 14. Chronic diastolic CHF: Monitor for signs of overload. Check weights daily. Continue Imdur, ASA and  15. Metastatic lung cancer/chronic right pleural effusion: On Tagrisso. Effusion stable.  16. Acute on chronic thrombocytopenia: Baseline around 50-->38. Monitor for signs of bleeding----will recheck in am. '  9/5- Plts 40- up from 38k- will recheck tomorrow 17. Neutropenia: Has been in 2-3 range--monitor ANC for now.  9/5- WBC 2.3- down from 2.6- will need to follow closely 18. Muscle cramps/spasms- 9/5- has Zanaflex 2 mg q8 hours prn- needs to ask for it.      LOS: 1 days A FACE TO FACE EVALUATION WAS PERFORMED  Turon Kilmer 12/13/2018, 10:26 AM
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
## 2018-12-14 DIAGNOSIS — E1159 Type 2 diabetes mellitus with other circulatory complications: Secondary | ICD-10-CM

## 2018-12-14 DIAGNOSIS — T451X5A Adverse effect of antineoplastic and immunosuppressive drugs, initial encounter: Secondary | ICD-10-CM

## 2018-12-14 DIAGNOSIS — C3431 Malignant neoplasm of lower lobe, right bronchus or lung: Secondary | ICD-10-CM

## 2018-12-14 DIAGNOSIS — D701 Agranulocytosis secondary to cancer chemotherapy: Secondary | ICD-10-CM

## 2018-12-14 LAB — CBC WITH DIFFERENTIAL/PLATELET
Abs Immature Granulocytes: 0.01 10*3/uL (ref 0.00–0.07)
Basophils Absolute: 0 10*3/uL (ref 0.0–0.1)
Basophils Relative: 1 %
Eosinophils Absolute: 0.1 10*3/uL (ref 0.0–0.5)
Eosinophils Relative: 5 %
HCT: 24.9 % — ABNORMAL LOW (ref 36.0–46.0)
Hemoglobin: 8.1 g/dL — ABNORMAL LOW (ref 12.0–15.0)
Immature Granulocytes: 0 %
Lymphocytes Relative: 29 %
Lymphs Abs: 0.8 10*3/uL (ref 0.7–4.0)
MCH: 30.3 pg (ref 26.0–34.0)
MCHC: 32.5 g/dL (ref 30.0–36.0)
MCV: 93.3 fL (ref 80.0–100.0)
Monocytes Absolute: 0.2 10*3/uL (ref 0.1–1.0)
Monocytes Relative: 8 %
Neutro Abs: 1.6 10*3/uL — ABNORMAL LOW (ref 1.7–7.7)
Neutrophils Relative %: 57 %
Platelets: 49 10*3/uL — ABNORMAL LOW (ref 150–400)
RBC: 2.67 MIL/uL — ABNORMAL LOW (ref 3.87–5.11)
RDW: 12.9 % (ref 11.5–15.5)
WBC: 2.8 10*3/uL — ABNORMAL LOW (ref 4.0–10.5)
nRBC: 0 % (ref 0.0–0.2)

## 2018-12-14 LAB — BASIC METABOLIC PANEL
Anion gap: 6 (ref 5–15)
BUN: 33 mg/dL — ABNORMAL HIGH (ref 8–23)
CO2: 26 mmol/L (ref 22–32)
Calcium: 9.1 mg/dL (ref 8.9–10.3)
Chloride: 110 mmol/L (ref 98–111)
Creatinine, Ser: 1.43 mg/dL — ABNORMAL HIGH (ref 0.44–1.00)
GFR calc Af Amer: 40 mL/min — ABNORMAL LOW (ref >60)
GFR calc non Af Amer: 35 mL/min — ABNORMAL LOW (ref >60)
Glucose, Bld: 95 mg/dL (ref 70–99)
Potassium: 4.3 mmol/L (ref 3.5–5.1)
Sodium: 142 mmol/L (ref 135–145)

## 2018-12-14 LAB — GLUCOSE, CAPILLARY
Glucose-Capillary: 96 mg/dL (ref 70–99)
Glucose-Capillary: 74 mg/dL (ref 70–99)
Glucose-Capillary: 98 mg/dL (ref 70–99)
Glucose-Capillary: 107 mg/dL — ABNORMAL HIGH (ref 70–99)

## 2018-12-14 NOTE — Plan of Care (Signed)
  Problem: Consults Goal: RH STROKE PATIENT EDUCATION Description: See Patient Education module for education specifics  Outcome: Progressing   Problem: RH BOWEL ELIMINATION Goal: RH STG MANAGE BOWEL WITH ASSISTANCE Description: STG Manage Bowel with  mod I Assistance. Outcome: Progressing   Problem: RH KNOWLEDGE DEFICIT Goal: RH STG INCREASE KNOWLEDGE OF HYPERTENSION Description: Pt will be able to manage HTN using medications and dietary restrictions following handouts and educational information independently Outcome: Progressing Goal: RH STG INCREASE KNOWLEGDE OF HYPERLIPIDEMIA Description:  Pt will be able to manage HLD using medications and dietary restrictions following handouts and educational information independently Outcome: Progressing Goal: RH STG INCREASE KNOWLEDGE OF STROKE PROPHYLAXIS Description: Pt will be able to manage secondary stroke prevention using handouts and educational information independently Outcome: Progressing

## 2018-12-14 NOTE — Progress Notes (Signed)
[place] PHYSICAL MEDICINE & REHABILITATION PROGRESS NOTE   Subjective/Complaints:  She took the Zanaflex prn for muscle spasms- was very helpful and likes that it's prn.  Is "here" today- no complaints specifically. Asking to have IV removed.  Objective:   No results found. Recent Labs    12/13/18 0456 12/14/18 0808  WBC 2.3* 2.8*  HGB 7.3* 8.1*  HCT 22.1* 24.9*  PLT 40* 49*   Recent Labs    12/13/18 0456 12/14/18 0808  NA 142 142  K 4.4 4.3  CL 110 110  CO2 23 26  GLUCOSE 109* 95  BUN 37* 33*  CREATININE 1.33* 1.43*  CALCIUM 8.8* 9.1    Intake/Output Summary (Last 24 hours) at 12/14/2018 1110 Last data filed at 12/14/2018 0830 Gross per 24 hour  Intake 520 ml  Output -  Net 520 ml     Physical Exam: Vital Signs Blood pressure (!) 157/42, pulse (!) 55, temperature 97.8 F (36.6 C), temperature source Oral, resp. rate 17, height 5\' 1"  (1.549 m), weight 49.3 kg, SpO2 99 %.  Nursing note, labsand vitalsreviewed. Constitutional: awake, alert, appropriate, sitting up in bed; bright/calm affect, appearscachectic.frail appearing, but better color today  resolving ecchymosis right forehead and small hematoma right upper lip. HENT:  Head:Normocephalicand atraumatic.  Eyes:EOMare normal.  Neck:Normal range of motion.No tracheal deviationpresent. No thyromegalypresent.  Cardiovascular:normal rate Respiratory:CTA B/L.  VQ:QVZD. ND, NT Musculoskeletal:Normal range of motion.  General: No deformityor edema.  Comments: Bilateral knees with multiple resolving yellowish bruises. Neurological: She is alertand oriented to person, place, and time. Right facial weakness with mild dysarthria. RUE tr biceps, wrist and finger flexion. RLE 1-2/5. Senses pain and light touch in all 4. Fair insight and awareness. Normal language. Follows all simple commands. Skin: Skin iswarmand dry. She isnot diaphoretic. Petechiae on left forearm. Diffuse  ecchymosis left groin--no hematoma appreciated. Minimal ecchymosis right groin with healing puncture/laceration. Psychiatric: She has a normal mood and affect. Herbehavior is normal.     Assessment/Plan: 1. 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  Physiatrist is providing close team supervision and 24 hour management of active medical problems listed below.  Physiatrist and rehab team continue to assess barriers to discharge/monitor patient progress toward functional and medical goals  Care Tool:  Bathing  Bathing activity did not occur: Refused           Bathing assist       Upper Body Dressing/Undressing Upper body dressing Upper body dressing/undressing activity did not occur (including orthotics): Safety/medical concerns What is the patient wearing?: Pull over shirt    Upper body assist      Lower Body Dressing/Undressing Lower body dressing      What is the patient wearing?: Underwear/pull up     Lower body assist Assist for lower body dressing: Moderate Assistance - Patient 50 - 74%     Toileting Toileting Toileting Activity did not occur (Clothing management and hygiene only): N/A (no void or bm)  Toileting assist Assist for toileting: Moderate Assistance - Patient 50 - 74%     Transfers Chair/bed transfer  Transfers assist     Chair/bed transfer assist level: Minimal Assistance - Patient > 75%     Locomotion Ambulation   Ambulation assist   Ambulation activity did not occur: Safety/medical concerns  Assist level: Minimal Assistance - Patient > 75%  Max distance: on the rail   Walk 10 feet activity   Assist  Walk 10 feet activity did not occur: Safety/medical concerns  Assist level: Minimal Assistance - Patient > 75%(rail)     Walk 50 feet  activity   Assist Walk 50 feet with 2 turns activity did not occur: Safety/medical concerns         Walk 150 feet activity   Assist Walk 150 feet activity did not occur: Safety/medical concerns         Walk 10 feet on uneven surface  activity   Assist Walk 10 feet on uneven surfaces activity did not occur: Safety/medical concerns         Wheelchair     Assist Will patient use wheelchair at discharge?: (TBD) Type of Wheelchair: Manual    Wheelchair assist level: Supervision/Verbal cueing Max wheelchair distance: 65'    Wheelchair 50 feet with 2 turns activity    Assist        Assist Level: Supervision/Verbal cueing   Wheelchair 150 feet activity     Assist  Wheelchair 150 feet activity did not occur: Safety/medical concerns       Blood pressure (!) 157/42, pulse (!) 55, temperature 97.8 F (36.6 C), temperature source Oral, resp. rate 17, height 5\' 1"  (1.549 m), weight 49.3 kg, SpO2 99 %.   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 Pt required transfer to acute d/t seizures and heart block -admit to inpatient rehab 2. Antithrombotics: -DVT/anticoagulation:Mechanical:Sequential compression devices, below kneeBilateral lower extremitiesdue to low platelets. -antiplatelet therapy: DAPTwith ASA and plavix 3. Pain Management:tylenol prn. 4. Mood:LCSW to follow for evaluation and support. -antipsychotic agents: N/A 5. Neuropsych: This patientiscapable of making decisions on herown behalf. 6. Skin/Wound Care:Monitor groin incisions for healing. Monitor right groin hematoma. 7. Fluids/Electrolytes/Nutrition:Monitor I/O. Check lytes in am. 8. TAVR/CHB s/p PPM: On ASA/Plavix due to recent TAVR.  9. New onset focal motor seizures: Continue Keppra bid X 3 months. Follow up with Dr. Felecia Shelling after  discharge.  11. Constipation:scheduled senna-s at hs 12. Acute on chronic anemia: Monitor for signs of bleeding. Monitor H/H trend has been around 7-8 range overall. Most recent check 7.4-->may need transfusion if </= 7.0.   9/5- Hb 7.3- pt was able to get up/move around with therapy  9/6- Hb 8.1-  13. Acute on chronic renal failure: Hyperkalemia resolved--recheck labs in am.ReportsSCr1.9 one month PTA.  9/5- Cr 1.33 down from 1.55  9/6- Cr up slightly to 1.43 but BUN down to 33 14. Chronic diastolic CHF: Monitor for signs of overload. Check weights daily. Continue Imdur, ASA and  15. Metastatic lung cancer/chronic right pleural effusion: On Tagrisso. Effusion stable.  16. Acute on chronic thrombocytopenia: Baseline around 50-->38. Monitor for signs of bleeding----will recheck in am. '  9/5- Plts 40- up from 38k- will recheck tomorrow  9/6- Plts 49k - improving 17. Neutropenia: Has been in 2-3 range--monitor ANC for now.  9/5- WBC 2.3- down from 2.6- will need to follow closely  9/6- WBC bounced up to 2.8k-  18. Muscle cramps/spasms- 9/5- has Zanaflex 2 mg q8 hours prn- needs to ask for it.  9/6- improved muscle spasms with meds 19. HTN- BP 150s/40s- will con't to monitor- don't want to decrease diastolic any more than it is.  2. Dispo- will determine length of stay with team  9/6- will d/c peripheral IV     LOS: 2 days A  FACE TO FACE EVALUATION WAS PERFORMED  Joanette Silveria 12/14/2018, 11:10 AM

## 2018-12-14 NOTE — Progress Notes (Signed)
Inpatient Rehabilitation  Patient information reviewed and entered into eRehab system by Donaven Criswell M. Karlyn Glasco, M.A., CCC/SLP, PPS Coordinator.  Information including medical coding, functional ability and quality indicators will be reviewed and updated through discharge.    

## 2018-12-15 ENCOUNTER — Inpatient Hospital Stay (HOSPITAL_COMMUNITY): Payer: Medicare Other | Admitting: Occupational Therapy

## 2018-12-15 ENCOUNTER — Inpatient Hospital Stay (HOSPITAL_COMMUNITY): Payer: Medicare Other | Admitting: Speech Pathology

## 2018-12-15 ENCOUNTER — Inpatient Hospital Stay (HOSPITAL_COMMUNITY): Payer: Medicare Other | Admitting: Physical Therapy

## 2018-12-15 ENCOUNTER — Inpatient Hospital Stay (HOSPITAL_COMMUNITY): Payer: Medicare Other

## 2018-12-15 DIAGNOSIS — G8191 Hemiplegia, unspecified affecting right dominant side: Secondary | ICD-10-CM

## 2018-12-15 DIAGNOSIS — D696 Thrombocytopenia, unspecified: Secondary | ICD-10-CM

## 2018-12-15 DIAGNOSIS — D638 Anemia in other chronic diseases classified elsewhere: Secondary | ICD-10-CM

## 2018-12-15 LAB — CBC WITH DIFFERENTIAL/PLATELET
Abs Immature Granulocytes: 0 K/uL (ref 0.00–0.07)
Basophils Absolute: 0 K/uL (ref 0.0–0.1)
Basophils Relative: 0 %
Eosinophils Absolute: 0.1 K/uL (ref 0.0–0.5)
Eosinophils Relative: 4 %
HCT: 24 % — ABNORMAL LOW (ref 36.0–46.0)
Hemoglobin: 8 g/dL — ABNORMAL LOW (ref 12.0–15.0)
Immature Granulocytes: 0 %
Lymphocytes Relative: 20 %
Lymphs Abs: 0.6 K/uL — ABNORMAL LOW (ref 0.7–4.0)
MCH: 30.4 pg (ref 26.0–34.0)
MCHC: 33.3 g/dL (ref 30.0–36.0)
MCV: 91.3 fL (ref 80.0–100.0)
Monocytes Absolute: 0.2 K/uL (ref 0.1–1.0)
Monocytes Relative: 8 %
Neutro Abs: 2.2 K/uL (ref 1.7–7.7)
Neutrophils Relative %: 68 %
Platelets: 54 K/uL — ABNORMAL LOW (ref 150–400)
RBC: 2.63 MIL/uL — ABNORMAL LOW (ref 3.87–5.11)
RDW: 12.9 % (ref 11.5–15.5)
WBC: 3.2 K/uL — ABNORMAL LOW (ref 4.0–10.5)
nRBC: 0 % (ref 0.0–0.2)

## 2018-12-15 LAB — BASIC METABOLIC PANEL WITH GFR
Anion gap: 8 (ref 5–15)
BUN: 31 mg/dL — ABNORMAL HIGH (ref 8–23)
CO2: 23 mmol/L (ref 22–32)
Calcium: 8.9 mg/dL (ref 8.9–10.3)
Chloride: 110 mmol/L (ref 98–111)
Creatinine, Ser: 1.46 mg/dL — ABNORMAL HIGH (ref 0.44–1.00)
GFR calc Af Amer: 39 mL/min — ABNORMAL LOW
GFR calc non Af Amer: 34 mL/min — ABNORMAL LOW
Glucose, Bld: 106 mg/dL — ABNORMAL HIGH (ref 70–99)
Potassium: 4.2 mmol/L (ref 3.5–5.1)
Sodium: 141 mmol/L (ref 135–145)

## 2018-12-15 LAB — GLUCOSE, CAPILLARY
Glucose-Capillary: 107 mg/dL — ABNORMAL HIGH (ref 70–99)
Glucose-Capillary: 82 mg/dL (ref 70–99)
Glucose-Capillary: 76 mg/dL (ref 70–99)
Glucose-Capillary: 98 mg/dL (ref 70–99)
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
## 2018-12-15 NOTE — Progress Notes (Signed)
Occupational Therapy Session Note  Patient Details  Name: Stacy Cook MRN: [id number] Date of Birth: June 14, 1935  Today's Date: 12/15/2018 OT Individual Time: 3300-7622 OT Individual Time Calculation (min): 30 min    Short Term Goals: Week 1:  OT Short Term Goal 1 (Week 1): Pt will complete UB dressing to donn a pullover shirt with min assist following hemidressing techniques. OT Short Term Goal 2 (Week 1): Pt will complete bathing sit to stand with min assist. OT Short Term Goal 3 (Week 1): Pt will peform toilet transfer with min assist stand pivot OT Short Term Goal 4 (Week 1): Pt will use the RUE as a gross assist with self care and grooming tasks with mod facilitation. OT Short Term Goal 5 (Week 1): Pt will complete LB dressing with mod assist for all aspects sit to stand.  Skilled Therapeutic Interventions/Progress Updates:    1:1 Engaged in dressing sit to stand at EOB. Min cues for hemi dressing technique and encourage for active movement in right UE during threading shirt. Pt able to perform sit to stand with min A with cues for activation in right UE. Contact guard with transfer to w/c. Performing oral care standing at the sink with focus on only right Ue support with min A for balance.   In the gym performed NMR tasks including towel slides on table top with shoulder horizontal adduction/abdcution, scapular protraction/ retraction and pushing UE away fro the back and then bring it back to the body. Pt able to able to demonstrate some index finger movement today with depression on soft foam surface. (as well as thumb, ring  And 5th digit   Therapy Documentation Precautions:  Precautions Precautions: ICD/Pacemaker, Fall, Other (comment)(seizures) Precaution Comments: right hemiparesis Restrictions Weight Bearing Restrictions: No RUE Weight Bearing: Non weight bearing RLE Weight Bearing: Weight bearing as tolerated LLE Weight Bearing: Weight bearing as tolerated Pain: Pain  Assessment Pain Scale: 0-10 Pain Score: 0-No pain Faces Pain Scale: No hurt   Therapy/Group: Individual Therapy  Willeen Cass [facility] 12/15/2018, 10:56 AM

## 2018-12-15 NOTE — Progress Notes (Signed)
Physical Therapy Session Note  Patient Details  Name: Stacy Cook MRN: [id number] Date of Birth: 1936-03-07  Today's Date: 12/15/2018 PT Individual Time: 3151-7616 PT Individual Time Calculation (min): 30 min   Short Term Goals: Week 1:  PT Short Term Goal 1 (Week 1): Pt will initiate stair training PT Short Term Goal 2 (Week 1): Pt will perform transfers w/ CGA PT Short Term Goal 3 (Week 1): Pt will tolerate 60 min of upright activity w/o increase in fatigue PT Short Term Goal 4 (Week 1): Pt will ambulate 50' w/ min assist using LRAD  Skilled Therapeutic Interventions/Progress Updates:   Pt received supine in bed and agreeable to PT. Supine>sit transfer with min assist at trunk to prevent R lateral LOB. PT donned shoes while pt sitting EOB with supervision assist for balance and safety. Squat pivot transfer to [facility] with min assist on the L with min cues for set up and Safety. Pt transported to rehab gym in [facility]. Gait training in rehab gym with min assist, RW, RAFO, and R hand orthotic x 101f. Min-mod cues for improved step height, terminal extension on the R and AD management. WC  Mobility with Hemi technique and supervision assist for safety through doorways. BP assessed sitting in WC, 153/36, HR 59. Pt returned to room and performed squat pivot transfer to bed with in assist on the R. Sit>supine completed with supervision assist and min cues for RLE management. Pt left supine in bed with call bell in reach and all needs met. Throughout treatment no reported of dizziness, lightheadedness, HA, or nausea.          Therapy Documentation Precautions:  Precautions Precautions: ICD/Pacemaker, Fall, Other (comment)(seizures) Precaution Comments: right hemiparesis Restrictions Weight Bearing Restrictions: No RUE Weight Bearing: Non weight bearing RLE Weight Bearing: Weight bearing as tolerated LLE Weight Bearing: Weight bearing as tolerated Vital Signs: Therapy Vitals Temp: 97.8 F (36.6  C) Temp Source: Oral Pulse Rate: 61 Resp: 14 BP: (!) 132/34 Patient Position (if appropriate): Lying Oxygen Therapy SpO2: 100 % O2 Device: Room Air Pain:   denies  Therapy/Group: Individual Therapy  ALorie Phenix9/10/2018, 5:39 PM

## 2018-12-15 NOTE — Progress Notes (Signed)
Physical Therapy Session Note  Patient Details  Name: Stacy Cook MRN: [id number] Date of Birth: 1935-04-22  Today's Date: 12/15/2018 PT Individual Time: 1030-1130 PT Individual Time Calculation (min): 60 min   Short Term Goals: Week 1:  PT Short Term Goal 1 (Week 1): Pt will initiate stair training PT Short Term Goal 2 (Week 1): Pt will perform transfers w/ CGA PT Short Term Goal 3 (Week 1): Pt will tolerate 60 min of upright activity w/o increase in fatigue PT Short Term Goal 4 (Week 1): Pt will ambulate 50' w/ min assist using LRAD  Skilled Therapeutic Interventions/Progress Updates:  Pt resting in w/c.  She denied pain.  She had diarrhea overnight and was concerned she might have bowel urgency.  neuromuscular re-education via forced use from w/c level, using [employer] at level 50 for bil LE alternting mobement, and at level 70 for RLE uniolateral facilitation.  Pt fatigued after each bout requiring rest breaks of a couple of minutes.  RLE neuro re-ed via forced use and multimodal cues for RLE swing phase: standing with bil UE support on rails, for R hip and knee flexion to tap R foot onto low step x 10.  Mod assist needed to clear foot.  PT donned R posterior leaf spring AFO on R; min assist to clear R foot. X 5.  Seated trunk flexion>< extension, x 5, for core activation.  Pt noted to have heavy reliance on LUE for trunk flexion.  W/c propulsion using hemi technique, supervision and cues.  Pt's foot barely reaches to floor; better with pillow behind back, x 50'.  Pt locked R brake with hand over hand assistance, and L brake with 1 cue.  Gait training on level tile x 25' with RAFO, L railing in hallway with mod assist for balance.  Pt progressed RLE, but needed cues for R hip and knee extension.  Pt very fatigued after 25'.  Pt requested getting back to bed, due to R quadrrceps spasms.  Stand pivot with min assist.  Sit> supine with supervision; pt lifting REL with L hand.  At end  of session, pt resting in bed with alarm set and needs at hand.     Therapy Documentation Precautions:  Precautions Precautions: ICD/Pacemaker, Fall, Other (comment)(seizures) Precaution Comments: right hemiparesis Restrictions Weight Bearing Restrictions: No RUE Weight Bearing: Non weight bearing RLE Weight Bearing: Weight bearing as tolerated LLE Weight Bearing: Weight bearing as tolerated  Pain: Pain Assessment Pain Scale: 0-10 Pain Score: 0-No pain Faces Pain Scale: No hurt       Therapy/Group: Individual Therapy  Kotaro Buer 12/15/2018, 11:40 AM

## 2018-12-15 NOTE — Progress Notes (Signed)
[facility] PHYSICAL MEDICINE & REHABILITATION PROGRESS NOTE   Subjective/Complaints:  Upset that she's getting laxatives as she's had a couple large soft stools early this morning and just prior to me seeing her today  ROS: Patient denies fever, rash, sore throat, blurred vision, nausea, vomiting, diarrhea, cough, shortness of breath or chest pain, joint or back pain, headache, or mood change.   Objective:   No results found. Recent Labs    12/13/18 0456 12/14/18 0808  WBC 2.3* 2.8*  HGB 7.3* 8.1*  HCT 22.1* 24.9*  PLT 40* 49*   Recent Labs    12/13/18 0456 12/14/18 0808  NA 142 142  K 4.4 4.3  CL 110 110  CO2 23 26  GLUCOSE 109* 95  BUN 37* 33*  CREATININE 1.33* 1.43*  CALCIUM 8.8* 9.1    Intake/Output Summary (Last 24 hours) at 12/15/2018 1019 Last data filed at 12/15/2018 0900 Gross per 24 hour  Intake 460 ml  Output -  Net 460 ml     Physical Exam: Vital Signs Blood pressure (!) 141/32, pulse 60, temperature 97.8 F (36.6 C), temperature source Oral, resp. rate 18, height 5\' 1"  (1.549 m), weight 48.8 kg, SpO2 99 %.  Constitutional: No distress . Vital signs reviewed. HEENT: EOMI, oral membranes moist. Bruising resolving over right eye/lip Neck: supple Cardiovascular: RRR without murmur. No JVD    Respiratory: CTA Bilaterally without wheezes or rales. Normal effort    GI: BS +, non-tender, non-distended  Musculoskeletal:Normal range of motion.  General: No deformityor edema.  Comments: Bilateral knees with multiple resolving yellowish bruises---all stable Neurological: She is alertand oriented to person, place, and time. Right facial weakness with mild dysarthria. RUE tr to 1/5 biceps, delt, triceps wrist and finger flexion. RLE 1-2/5 except for ADF which is absent. Senses pain and light touch in all 4. Fair insight and awareness. Normal language. Follows all simple commands. Skin: Skin iswarmand dry. She isnot diaphoretic. See above,  scattered bruising Psychiatric: a little irritable, impulsive     Assessment/Plan: 1. 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  Physiatrist is providing close team supervision and 24 hour management of active medical problems listed below.  Physiatrist and rehab team continue to assess barriers to discharge/monitor patient progress toward functional and medical goals  Care Tool:  Bathing  Bathing activity did not occur: Refused           Bathing assist       Upper Body Dressing/Undressing Upper body dressing Upper body dressing/undressing activity did not occur (including orthotics): Safety/medical concerns What is the patient wearing?: Pull over shirt    Upper body assist Assist Level: Moderate Assistance - Patient 50 - 74%    Lower Body Dressing/Undressing Lower body dressing      What is the patient wearing?: Underwear/pull up     Lower body assist Assist for lower body dressing: Minimal Assistance - Patient > 75%     Toileting Toileting Toileting Activity did not occur (Clothing management and hygiene only): N/A (no void or bm)  Toileting assist Assist for toileting: Minimal Assistance - Patient > 75%     Transfers Chair/bed transfer  Transfers assist     Chair/bed transfer assist level: Minimal Assistance - Patient > 75%     Locomotion Ambulation   Ambulation assist   Ambulation activity did not occur:  Safety/medical concerns  Assist level: Minimal Assistance - Patient > 75%   Max distance: on the rail   Walk 10 feet activity   Assist  Walk 10 feet activity did not occur: Safety/medical concerns  Assist level: Minimal Assistance - Patient > 75%(rail)     Walk 50 feet activity   Assist Walk 50 feet with 2 turns activity did not occur: Safety/medical concerns          Walk 150 feet activity   Assist Walk 150 feet activity did not occur: Safety/medical concerns         Walk 10 feet on uneven surface  activity   Assist Walk 10 feet on uneven surfaces activity did not occur: Safety/medical concerns         Wheelchair     Assist Will patient use wheelchair at discharge?: (TBD) Type of Wheelchair: Manual    Wheelchair assist level: Supervision/Verbal cueing Max wheelchair distance: 74'    Wheelchair 50 feet with 2 turns activity    Assist        Assist Level: Supervision/Verbal cueing   Wheelchair 150 feet activity     Assist  Wheelchair 150 feet activity did not occur: Safety/medical concerns       Blood pressure (!) 141/32, pulse 60, temperature 97.8 F (36.6 C), temperature source Oral, resp. rate 18, height 5\' 1"  (1.549 m), weight 48.8 kg, SpO2 99 %.   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 Pt required transfer to acute d/t seizures and heart block --Continue CIR therapies including PT, OT, and SLP  2. Antithrombotics: -DVT/anticoagulation:Mechanical:Sequential compression devices, below kneeBilateral lower extremitiesdue to low platelets. -antiplatelet therapy: DAPTwith ASA and plavix 3. Pain Management:tylenol prn. 4. Mood:LCSW to follow for evaluation and support. -antipsychotic agents: N/A 5. Neuropsych: This patientiscapable of making decisions on herown behalf. 6. Skin/Wound Care:Monitor groin incisions for healing. Monitor right groin hematoma. 7. Fluids/Electrolytes/Nutrition:Monitor I/O. Check lytes in am. 8. TAVR/CHB s/p PPM: On ASA/Plavix due to recent TAVR.  9. New onset focal motor seizures: Continue Keppra bid X 3 months. Follow up with Dr. Felecia Shelling after discharge.  11. Constipation:hold senna for now 12. Acute on chronic anemia:  Monitor for signs of bleeding. Monitor H/H trend has been around 7-8 range overall. consider transfusion if </= 7.0.   9/6- Hb 8.1-  13. Acute on chronic renal failure: Hyperkalemia resolved--recheck labs in am.ReportsSCr1.9 one month PTA.  9/6- Cr up slightly to 1.43 but BUN down to 33 14. Chronic diastolic CHF: Monitor for signs of overload.   Continue Imdur, ASA and   Filed Weights   12/13/18 0337 12/14/18 0450 12/15/18 0309  Weight: 57.4 kg 49.3 kg 48.8 kg    15. Metastatic lung cancer/chronic right pleural effusion: On Tagrisso. Effusion stable.  16. Acute on chronic thrombocytopenia: Baseline around 50-->38.   9/6- Plts 49k - stable 17. Neutropenia: Has been in 2-3 range--monitor ANC for now.  9/6- WBC   to 2.8k 18. Muscle cramps/spasms- 9/5- has Zanaflex 2 mg q8 hours prn- needs to ask for it.  9/6- improved muscle spasms when she uses med 19. HTN- bp 141/32--  -DBP has been running quite low  -defer to cardiology       LOS: 3 days A FACE TO [place] 12/15/2018, 10:19 AM

## 2018-12-15 NOTE — IPOC Note (Signed)
Overall Plan of Care [facility]) Patient Details Name: Stacy Cook MRN: [id number] DOB: 08/12/1935  Admitting Diagnosis: Embolic stroke [facility])  Hospital Problems: Principal Problem:   Embolic stroke [facility]) Active Problems:   Thrombocytopenia ([place])   Type 2 diabetes mellitus ([place])   Primary malignant neoplasm of right lower lobe of lung (H[facility])   Anemia of chronic disease   Right hemiparesis ([facility])   Muscle spasm of right lower extremity   Neutropenia ([place])     Functional Problem List: Nursing Endurance, Medication Management, Safety  PT Balance, Endurance, Motor, Pain, Safety, Sensory  OT Balance, Motor, Sensory  SLP    TR         Basic ADL's: OT Grooming, Bathing, Dressing, Toileting     Advanced  ADL's: OT       Transfers: PT Bed Mobility, Bed to Chair, Car, Furniture, Floor  OT Tub/Shower, Agricultural engineer: PT Ambulation, Emergency planning/management officer, Stairs     Additional Impairments: OT Fuctional Use of Upper Extremity  SLP        TR      Anticipated Outcomes Item Anticipated Outcome  Self Feeding    Swallowing      Basic self-care  supervision  Toileting  supervision   Bathroom Transfers supervision  Bowel/Bladder  manage bowel with mod I assist  Transfers  supervision  Locomotion  supervision, household gait w/ LRAD  Communication     Cognition     Pain     Safety/Judgment  maintain safety with cues/reminders   Therapy Plan: PT Intensity: Minimum of 1-2 x/day ,45 to 90 minutes PT Frequency: 5 out of 7 days PT Duration Estimated Length of Stay: 2 weeks OT Intensity: Minimum of 1-2 x/day, 45 to 90 minutes OT Frequency: 5 out of 7 days OT Duration/Estimated Length of Stay: 14-16 days     Due to the current state of emergency, patients may not be receiving their 3-hours of Medicare-mandated therapy.   Team Interventions: Nursing Interventions Patient/Family Education, Disease Management/Prevention, Medication Management, Bowel  Management, Discharge Planning  PT interventions Ambulation/gait training, Community reintegration, Neuromuscular re-education, DME/adaptive equipment instruction, Psychosocial support, Stair training, UE/LE Strength taining/ROM, Wheelchair propulsion/positioning, UE/LE Coordination activities, Therapeutic Activities, Skin care/wound management, Pain management, Discharge planning, Balance/vestibular training, Disease management/prevention, Functional mobility training, Patient/family education, Splinting/orthotics, Therapeutic Exercise  OT Interventions Balance/vestibular training, Discharge planning, Functional electrical stimulation, Pain management, Self Care/advanced ADL retraining, Therapeutic Activities, UE/LE Coordination activities, Therapeutic Exercise, Patient/family education, Functional mobility training, Disease mangement/prevention, Community reintegration, Engineer, drilling, Neuromuscular re-education, UE/LE Strength taining/ROM, Splinting/orthotics, Wheelchair propulsion/positioning  SLP Interventions    TR Interventions    SW/CM Interventions Discharge Planning, Psychosocial Support, Patient/Family Education   Barriers to Discharge MD  Medical stability  Nursing      PT Inaccessible home environment, Medical stability, Pending chemo/radiation stage IV lung cancer, 3 steps to enter home w/o rails  OT Decreased caregiver support    SLP      SW       Team Discharge Planning: Destination: PT-Home ,OT- Home , SLP-Home Projected Follow-up: PT-Home health PT, OT-  24 hour supervision/assistance, SLP-None Projected Equipment Needs: PT-To be determined, OT- 3 in 1 bedside comode, Tub/shower bench, SLP-None recommended by SLP Equipment Details: PT- , OT-  Patient/family involved in discharge planning: PT- Patient,  OT-Patient, SLP-Patient  MD ELOS: 14-15 days Medical Rehab Prognosis:  Excellent Assessment: The patient has been admitted for CIR therapies with the  diagnosis of embolic CVA with course complicated  by CHB. The team will be addressing functional mobility, strength, stamina, balance, safety, adaptive techniques and equipment, self-care, bowel and bladder mgt, patient and caregiver education, NMR, orthotics, community reentry, ego support. Goals have been set at supervision for self-care and mobility.   Due to the current state of emergency, patients may not be receiving their 3 hours per day of Medicare-mandated therapy.    Meredith Staggers, MD, FAAPMR      See Team Conference Notes for weekly updates to the plan of care

## 2018-12-15 NOTE — Progress Notes (Signed)
Occupational Therapy Session Note  Patient Details  Name: Stacy Cook MRN: [id number] Date of Birth: Jun 25, 1935  Today's Date: 12/15/2018 OT Individual Time: 1245-1345 OT Individual Time Calculation (min): 60 min    Short Term Goals: Week 1:  OT Short Term Goal 1 (Week 1): Pt will complete UB dressing to donn a pullover shirt with min assist following hemidressing techniques. OT Short Term Goal 2 (Week 1): Pt will complete bathing sit to stand with min assist. OT Short Term Goal 3 (Week 1): Pt will peform toilet transfer with min assist stand pivot OT Short Term Goal 4 (Week 1): Pt will use the RUE as a gross assist with self care and grooming tasks with mod facilitation. OT Short Term Goal 5 (Week 1): Pt will complete LB dressing with mod assist for all aspects sit to stand.  Skilled Therapeutic Interventions/Progress Updates: patient stated she'd only interrupted sleep all night due to gas and multiple BMs and was fatigued.   But she was agreeable to participation this session as follows: Bed mobility with cues for technique due to hemiparesis left Upper extremity and lower extremity=extra time and supervision.   This practice is partially important because patient stated she needs to be able to move herself about in bed and in the recliner once she is home.  Bed to edge of bed therapy= cues to watch positioning and placement of left hand for wrist joint integrity (she tended not to watch or keep an eye on it and often wrist was flexed in potentially unsafe position)  Lower body dressing sitting edge of bed (dynamic sitting balance was good, except required cues for trunk stability and contact guard assist when she flexed forward to don right AFO into shoe    = moderate assistance extra time to rest as patient stated due to her heart and lung poor health and lung cancer she fatigues quickly>   Energy conservation- patient incorporated in order to allow her herself to regain strength and  endurance to continue the various tasks  Bed to/fr w/c transfer training= close S for stand pivot transfer.   Patient was able to pivot hemi right leg  Toilet transfer (w/c to/fr regular toilet) = squat pivot and MinA as she utilized grab bar   Toileting (sit to stand)= patient asked for time to void and have a BM (clinician utilized clincial judgemental for safety needs and stayed in room to be sure patient did not lose balance while on toilet)   cues to balance while sitting on toilet as she preferred to wipe frontal area seated and cues and CGA to maintain dynamic standing balance  to reach back to buttocks to cleanse after BM  Utilizing right UE to stabilize cup and other lunch items while opening or stabilizing food and containers.    Patient staated she could not use her right hand; however, this clinican helped educate her on using it as a stabilizer.   As well, she was able to see her right digits flex as she squeezed this clinician's hand and squeeze the yellow sponge.   She was able to understand that she will likely be able to squeeze wash cloth minimally with extra time and multiple tries  Sit to stand x6 attempts= close S to CGA (fatigued slightly as she continued to work during the session)  Continue OT Plan of care    Therapy Documentation Precautions:  Precautions Precautions: ICD/Pacemaker, Fall, Other (comment)(seizures) Precaution Comments: right hemiparesis Restrictions Weight Bearing Restrictions: No  RUE Weight Bearing: Non weight bearing RLE Weight Bearing: Weight bearing as tolerated LLE Weight Bearing: Weight bearing as tolerated  Pain:denied     Therapy/Group: Individual Therapy  Alfredia Ferguson [facility] 12/15/2018, 3:24 PM

## 2018-12-16 ENCOUNTER — Encounter (HOSPITAL_COMMUNITY): Payer: Medicare Other | Admitting: Psychology

## 2018-12-16 ENCOUNTER — Inpatient Hospital Stay (HOSPITAL_COMMUNITY): Payer: Medicare Other

## 2018-12-16 ENCOUNTER — Inpatient Hospital Stay (HOSPITAL_COMMUNITY): Payer: Medicare Other | Admitting: Physical Therapy

## 2018-12-16 ENCOUNTER — Inpatient Hospital Stay (HOSPITAL_COMMUNITY): Payer: Medicare Other | Admitting: Occupational Therapy

## 2018-12-16 DIAGNOSIS — I6319 Cerebral infarction due to embolism of other precerebral artery: Secondary | ICD-10-CM

## 2018-12-16 LAB — GLUCOSE, CAPILLARY: Glucose-Capillary: 97 mg/dL (ref 70–99)

## 2018-12-16 MED ORDER — CALCIUM POLYCARBOPHIL 625 MG PO TABS
625.0000 mg | ORAL_TABLET | Freq: Every day | ORAL | Status: DC
  Administered 2018-12-16 – 2018-12-27 (×12): 625 mg via ORAL
  Filled 2018-12-16 (×12): qty 1

## 2018-12-16 MED ORDER — SACCHAROMYCES BOULARDII 250 MG PO CAPS
250.0000 mg | ORAL_CAPSULE | Freq: Two times a day (BID) | ORAL | Status: DC
  Administered 2018-12-16 – 2018-12-18 (×5): 250 mg via ORAL
  Filled 2018-12-16 (×5): qty 1
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
## 2018-12-16 NOTE — Progress Notes (Signed)
Occupational Therapy Session Note  Patient Details  Name: Stacy Cook MRN: [id number] Date of Birth: 11/22/1935  Today's Date: 12/16/2018 OT Individual Time: 3338-3291 OT Individual Time Calculation (min): 74 min    Short Term Goals: Week 1:  OT Short Term Goal 1 (Week 1): Pt will complete UB dressing to donn a pullover shirt with min assist following hemidressing techniques. OT Short Term Goal 2 (Week 1): Pt will complete bathing sit to stand with min assist. OT Short Term Goal 3 (Week 1): Pt will peform toilet transfer with min assist stand pivot OT Short Term Goal 4 (Week 1): Pt will use the RUE as a gross assist with self care and grooming tasks with mod facilitation. OT Short Term Goal 5 (Week 1): Pt will complete LB dressing with mod assist for all aspects sit to stand.  Skilled Therapeutic Interventions/Progress Updates:    Pt greeted semi-reclined in bed and agreeable to OT treatment session. Pt declined bathing/dressing this morning. Pt came to sitting EOB with min A. OT donned TED hose and shoes for pt, then she completed stand-pivot to wc on stronger L side with min A. Worked on wc propulsion to therapy gym with 2 rest breaks. Stand-pivot to therapy mat with min A. OT applied shoe button to R shoe and practiced fastening shoe lace. OT only able to locate one shoe button 2/2 being out of stock. R UE NMR sitting on mat with OT providing joint input to bring pt through feel shoulder, elbow ROM. Pt brought into gravity eliminated sidelying position and worked on shoulder flex/ext with good shoulder activation. OT then facilitated elbow flex/ext isolation with pt able to turn on triceps for elbow extension, but difficulty isolating biceps for flexion. Pt reported urgent need to go to the bathroom. Stand-pivot back to wc with min A. OT brought pt back to room and pt completed stand-pivot to commode with min A and use of grab bars. Pt able to maintain standing balance w/ CGA while managing  pants. Pt with successful void of bowel and bladder. Pt abel to work on [employer] but needed OT assist for thoroughness. Grooming tasks completed at the sink with  Education provided for ways to use R hand as a stabilizer. Practiced R hand grasp with yellow foam. Pt left with alarm belt on, call bell in reach, and needs met.   Therapy Documentation Precautions:  Precautions Precautions: ICD/Pacemaker, Fall, Other (comment)(seizures) Precaution Comments: right hemiparesis Restrictions Weight Bearing Restrictions: No RUE Weight Bearing: Non weight bearing RLE Weight Bearing: Weight bearing as tolerated LLE Weight Bearing: Weight bearing as tolerated Pain: Pain Assessment Pain Scale: 0-10 Pain Score: 0-No pain   Therapy/Group: Individual Therapy  Valma Cava 12/16/2018, 8:12 AM

## 2018-12-16 NOTE — Progress Notes (Signed)
Physical Therapy Session Note  Patient Details  Name: Stacy Cook MRN: [id number] Date of Birth: [birth year]  Today's Date: 12/16/2018 PT Individual Time: 1300-1415 PT Individual Time Calculation (min): 75 min   Short Term Goals: Week 1:  PT Short Term Goal 1 (Week 1): Pt will initiate stair training PT Short Term Goal 2 (Week 1): Pt will perform transfers w/ CGA PT Short Term Goal 3 (Week 1): Pt will tolerate 60 min of upright activity w/o increase in fatigue PT Short Term Goal 4 (Week 1): Pt will ambulate 50' w/ min assist using LRAD  Skilled Therapeutic Interventions/Progress Updates:   Pt in supine and agreeable to therapy, no c/o pain at rest but did c/o muscle spasms throughout session w/ any active RLE extension - resolves once bent into flexion. Supine>sit w/ min assist and min assist stand pivot to w/c. Total assist w/c transport to/from therapy gym. Worked on gait training, ambulated w/ RW x150' w/ CGA-min assist and verbal cues for gait pattern. Ambulated 51' w/o AD, min assist for upright balance and lateral weight shifting. More prominent R knee hyperextension in stance and decreased stance time on RLE. Ortho rep present to assess RAFO use. Ambulated in multiple bouts of 25-50' w/ CGA using RW and various AFOs. Pt w/ higher quality of gait pattern when using posterior leaf spring and R heel wedge, no R hyperextension noted. RLE NMR in supine on mat including heel slides 1x5, knee-to-chest 2x10, supine bridge 3x10, and clamshell 2x10. Tactile and verbal cues for muscle isolation and technique w/ movement. NuStep 4 min and 2 min @ level 1 w/ BLEs only to work on RLE muscle activation, reciprocal movement pattern, and endurance. 3/10 on RPE. Returned to room and performed toilet transfer w/ min assist, pt continent of void. Ended session in supine, all needs in reach.   Therapy Documentation Precautions:  Precautions Precautions: ICD/Pacemaker, Fall, Other  (comment)(seizures) Precaution Comments: right hemiparesis Restrictions Weight Bearing Restrictions: No RUE Weight Bearing: Non weight bearing RLE Weight Bearing: Weight bearing as tolerated LLE Weight Bearing: Weight bearing as tolerated  Therapy/Group: Individual Therapy  Mohamed Portlock K Kayia Billinger 12/16/2018, 2:21 PM

## 2018-12-16 NOTE — Progress Notes (Signed)
[facility] PHYSICAL MEDICINE & REHABILITATION PROGRESS NOTE   Subjective/Complaints: Had loose/mushy stool x 3 this morning. Otherwise feeling better. Denies any dizziness. Feels fatigued at times  ROS: Patient denies fever, rash, sore throat, blurred vision, nausea, vomiting,   cough, shortness of breath or chest pain, joint or back pain, headache, or mood change.   Objective:   No results found. Recent Labs    12/14/18 0808 12/15/18 1140  WBC 2.8* 3.2*  HGB 8.1* 8.0*  HCT 24.9* 24.0*  PLT 49* 54*   Recent Labs    12/14/18 0808 12/15/18 1140  NA 142 141  K 4.3 4.2  CL 110 110  CO2 26 23  GLUCOSE 95 106*  BUN 33* 31*  CREATININE 1.43* 1.46*  CALCIUM 9.1 8.9    Intake/Output Summary (Last 24 hours) at 12/16/2018 0947 Last data filed at 12/16/2018 0900 Gross per 24 hour  Intake 480 ml  Output -  Net 480 ml     Physical Exam: Vital Signs Blood pressure (!) 148/33, pulse (!) 55, temperature 97.7 F (36.5 C), temperature source Oral, resp. rate 17, height 5\' 1"  (1.549 m), weight 48.7 kg, SpO2 100 %.  Constitutional: No distress . Vital signs reviewed. HEENT: EOMI, oral membranes moist Neck: supple Cardiovascular: brady without murmur. No JVD    Respiratory: CTA Bilaterally without wheezes or rales. Normal effort    GI: BS +, non-tender, non-distended  Musculoskeletal:Normal range of motion.  General: No deformityor edema.  Neurological: She is alertand oriented to person, place, and time. Right facial weakness with mild dysarthria. RUE tr to 1/5 biceps, delt, triceps wrist and finger flexion. RLE 1-2/5 except for ADF which is absent. Senses pain and light touch in all 4. Fair insight and awareness. Normal language. Follows all simple commands. Skin: Skin iswarmand dry. She isnot diaphoretic.  scattered bruisingface/limbs all resolving Psychiatric: in good spirits.      Assessment/Plan: 1. Functional deficits secondary to  R hemiparesis  secondary to L embolic pontine and frontal infract after TAVR which require 3+ hours per day of interdisciplinary therapy in a comprehensive inpatient rehab setting.  Physiatrist is providing close team supervision and 24 hour management of active medical problems listed below.  Physiatrist and rehab team continue to assess barriers to discharge/monitor patient progress toward functional and medical goals  Care Tool:  Bathing  Bathing activity did not occur: Refused           Bathing assist       Upper Body Dressing/Undressing Upper body dressing Upper body dressing/undressing activity did not occur (including orthotics): Safety/medical concerns What is the patient wearing?: Pull over shirt    Upper body assist Assist Level: Moderate Assistance - Patient 50 - 74%    Lower Body Dressing/Undressing Lower body dressing      What is the patient wearing?: Underwear/pull up     Lower body assist Assist for lower body dressing: Minimal Assistance - Patient > 75%     Toileting Toileting Toileting Activity did not occur (Clothing management and hygiene only): N/A (no void or bm)  Toileting assist Assist for toileting: Minimal Assistance - Patient > 75%     Transfers Chair/bed transfer  Transfers assist     Chair/bed transfer assist level: Contact Guard/Touching assist     Locomotion Ambulation   Ambulation assist   Ambulation activity did not occur: Safety/medical concerns  Assist level: Minimal Assistance - Patient > 75% Assistive device: Walker-rolling Max distance: 40   Walk 10 feet  activity   Assist  Walk 10 feet activity did not occur: Safety/medical concerns  Assist level: Minimal Assistance - Patient > 75% Assistive device: Walker-rolling, Orthosis   Walk 50 feet activity   Assist Walk 50 feet with 2 turns activity did not occur: Safety/medical concerns         Walk 150 feet activity   Assist Walk 150 feet activity did not occur:  Safety/medical concerns         Walk 10 feet on uneven surface  activity   Assist Walk 10 feet on uneven surfaces activity did not occur: Safety/medical concerns         Wheelchair     Assist Will patient use wheelchair at discharge?: Yes Type of Wheelchair: Manual    Wheelchair assist level: Supervision/Verbal cueing Max wheelchair distance: 150    Wheelchair 50 feet with 2 turns activity    Assist        Assist Level: Supervision/Verbal cueing   Wheelchair 150 feet activity     Assist  Wheelchair 150 feet activity did not occur: Safety/medical concerns   Assist Level: Supervision/Verbal cueing   Blood pressure (!) 148/33, pulse (!) 55, temperature 97.7 F (36.5 C), temperature source Oral, resp. rate 17, height 5\' 1"  (1.549 m), weight 48.7 kg, SpO2 100 %.   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 Pt required transfer to acute d/t seizures and heart block --Continue CIR therapies including PT, OT, and SLP   -team conference today 2. Antithrombotics: -DVT/anticoagulation:Mechanical:Sequential compression devices, below kneeBilateral lower extremitiesdue to low platelets. -antiplatelet therapy: DAPTwith ASA and plavix 3. Pain Management:tylenol prn. 4. Mood:LCSW to follow for evaluation and support. -antipsychotic agents: N/A 5. Neuropsych: This patientiscapable of making decisions on herown behalf. 6. Skin/Wound Care:Monitor groin incisions for healing. Monitor right groin hematoma. 7. Fluids/Electrolytes/Nutrition:Monitor I/O.encourage PO 8. TAVR/CHB s/p PPM: On ASA/Plavix due to recent TAVR.   -metoprolol still on hold 9. New onset focal motor seizures: Continue Keppra bid X 3 months. Follow up with Dr. Felecia Shelling after discharge.  11. Constipation:stools actually loose now  -held senna    -add probiotic and fiber 12. Acute on chronic anemia: Monitor for signs of bleeding. Monitor H/H trend has been around 7-8 range overall. consider transfusion if </= 7.0.   9/7- Hb 8.0 13. Acute on chronic renal failure: Hyperkalemia resolved--recheck labs in am.ReportsSCr1.9 one month PTA.  9/7- Cr up slightly to 1.46 but BUN down to 31 14. Chronic diastolic CHF: Monitor for signs of overload.   Continue Imdur, ASA and   Filed Weights   12/14/18 0450 12/15/18 0309 12/16/18 0338  Weight: 49.3 kg 48.8 kg 48.7 kg    15. Metastatic lung cancer/chronic right pleural effusion: On [place]. Effusion stable.  16. Acute on chronic thrombocytopenia: Baseline around 50-->38.   9/6- Plts 49k - stable 17. Neutropenia: Has been in 2-3 range--monitor ANC for now.  9/6- WBC   to 2.8k 18. Muscle cramps/spasms- 9/5- has Zanaflex 2 mg q8 hours prn- needs to ask for it.  9/6- improved muscle spasms when she uses med 19. HTN- bp 141/32--  -DBP has been running quite low but systolic robust  -continue with current meds for now  -cardiology following from dx       LOS: 4 days A FACE TO [place] 12/16/2018, 9:47 AM

## 2018-12-16 NOTE — Patient Care Conference (Signed)
Inpatient RehabilitationTeam Conference and Plan of Care Update Date: 12/16/2018   Time: 10:45 AM    Patient Name: Stacy Cook      Medical Record Number: [id number]  Date of Birth: Jan 17, 1936 Sex: Female         Room/Bed: 4W23C/4W23C-01 Payor Info: Payor: [employer] MEDICARE / Plan: [facility] MEDICARE / Product Type: *No Product type* /    Admitting Diagnosis: 2. ABI Team  LT. CVA due to embolism; 17-19days  Admit Date/Time:  12/12/2018  6:24 PM Admission Comments: No comment available   Primary Diagnosis:  Embolic stroke [facility]) Principal Problem: Embolic stroke [facility])  Patient Active Problem List   Diagnosis Date Noted  . Right hemiparesis ([place])   . Muscle spasm of right lower extremity   . Neutropenia ([place])   . Heart block 12/10/2018  . Seizures ([place]) 12/09/2018  . Cardiac asystole ([place]) 12/09/2018  . Anxiety state   . Anemia of chronic disease   . Acute renal failure ([place])   . Embolic stroke ([place]) 09/38/1829  . S/P TAVR (transcatheter aortic valve replacement) 12/02/2018  . Acute on chronic diastolic heart failure ([place]) 12/02/2018  . Anemia   . GERD (gastroesophageal reflux disease)   . Hypertension 02/14/2018  . Acquired iron deficiency anemia due to decreased absorption 09/09/2017  . Adenocarcinoma of gastric cardia ([place]) 04/10/2016  . Carotid stenosis 02/07/2016  . Primary malignant neoplasm of right lower lobe of lung ([place]) 12/02/2015  . B12 deficiency 03/24/2015  . CAD S/P percutaneous coronary angioplasty 03/24/2015  . Neuroendocrine tumor 03/24/2015  . Type 2 diabetes mellitus ([place]) 03/24/2015  . Thrombocytopenia ([place]) 11/10/2014  . Neuritis or radiculitis due to rupture of lumbar intervertebral disc 09/10/2014  . Nerve root inflammation 03/11/2014  . HLD (hyperlipidemia) 08/24/2013    Expected Discharge Date: Expected Discharge Date: 12/27/18  Team Members Present: Physician leading conference: Dr. Alger Simons Social Worker Present: Lennart Pall,  LCSW Nurse Present: Rosita Fire, RN PT Present: Burnard Bunting, PT OT Present: Cherylynn Ridges, OT SLP Present: Weston Anna, SLP PPS Coordinator present : Gunnar Fusi, SLP     Current Status/Progress Goal Weekly Team Focus  Medical   left frontal cva with righ hemiparesis, CHB with pacer on keppra for sz proph  improve activity tolerance  sz, bowels, sz rx, bp control   Bowel/Bladder   Pt continent B/B. LBM 12/15/2018.  Maintain regular bowel pattern.  Assist with toileting needs PRN.   Swallow/Nutrition/ Hydration             ADL's   min/mod A overall  Supervision  R NMR, transfers, self-care retraining, balance, activity tolerance   Mobility   min-CGA overall, gait 150' w/ RW  supervision, household gait  OOB tolerance and endurance, RLE NMR, functional balance   Communication             Safety/Cognition/ Behavioral Observations            Pain   No complaints of pain.  Remain pain free.  Assess pain Q shift and PRN.   Skin   Pt has 2 abd incisions.  Treat skin issues per orders.  Assess skin Q shift and PRN.    Rehab Goals Patient on target to meet rehab goals: Yes *See Care Plan and progress notes for long and short-term goals.     Barriers to Discharge  Current Status/Progress Possible Resolutions Date Resolved   Physician    Medical stability  Nursing                  PT  Inaccessible home environment;Medical stability;Pending chemo/radiation  stage IV lung cancer, 3 steps to enter home w/o rails              OT Decreased caregiver support                SLP                SW                Discharge Planning/Teaching Needs:  Pt to d/c home with family to arrange 24/7 assistance.  Teaching to be planned closer to d/c.   Team Discussion:  Returning to CIR following pacemaker placement.  Still with low Hgb.  Loose stools and meds added.  Cont b/b.  Min-mod ADLs with good return in right arm.  CGA - min for amb with right AFO.  Goals at  supervision overall  Revisions to Treatment Plan:  NA    Continued Need for Acute Rehabilitation Level of Care: The patient requires daily medical management by a physician with specialized training in physical medicine and rehabilitation for the following conditions: Daily direction of a multidisciplinary physical rehabilitation program to ensure safe treatment while eliciting the highest outcome that is of practical value to the patient.: Yes Daily medical management of patient stability for increased activity during participation in an intensive rehabilitation regime.: Yes Daily analysis of laboratory values and/or radiology reports with any subsequent need for medication adjustment of medical intervention for : Neurological problems;Blood pressure problems;Cardiac problems   I attest that I was present, lead the team conference, and concur with the assessment and plan of the team.   Lennart Pall 12/16/2018, 3:43 PM    Team conference was held via web/ teleconference due to [place] - 19

## 2018-12-16 NOTE — Progress Notes (Signed)
Social Work Patient ID: Stacy Cook, female   DOB: 1936/02/23, 83 y.o.   MRN: [id number]  Have reviewed team conference with pt and son, Stacy Cook (via phone).  Both aware and agreeable with targeted d/c date of 9/19 and supervision goals.  Both pleased with progress and son reports he is making arrangement for private duty caregiver to be with pt when family not available.  May want to have this person come to complete education along with family prior to d/c.  Will continue to follow.  Svea Pusch, LCSW

## 2018-12-16 NOTE — Progress Notes (Signed)
Orthopedic Tech Progress Note Patient Details:  Cayle Cordoba [facility] 1936-03-21 [id number] Called in order to [employer] for a AFO Patient ID: Keyandra Swenson, female   DOB: July 13, 1935, 83 y.o.   MRN: [id number]   Janit Pagan 12/16/2018, 10:49 AM

## 2018-12-16 NOTE — Plan of Care (Signed)
  Problem: Consults Goal: RH STROKE PATIENT EDUCATION Description: See Patient Education module for education specifics  Outcome: Progressing   Problem: RH BOWEL ELIMINATION Goal: RH STG MANAGE BOWEL WITH ASSISTANCE Description: STG Manage Bowel with  mod I Assistance. Outcome: Progressing   Problem: RH KNOWLEDGE DEFICIT Goal: RH STG INCREASE KNOWLEDGE OF HYPERTENSION Description: Pt will be able to manage HTN using medications and dietary restrictions following handouts and educational information independently Outcome: Progressing Goal: RH STG INCREASE KNOWLEGDE OF HYPERLIPIDEMIA Description:  Pt will be able to manage HLD using medications and dietary restrictions following handouts and educational information independently Outcome: Progressing Goal: RH STG INCREASE KNOWLEDGE OF STROKE PROPHYLAXIS Description: Pt will be able to manage secondary stroke prevention using handouts and educational information independently Outcome: Progressing   Problem: RH SAFETY Goal: RH STG ADHERE TO SAFETY PRECAUTIONS W/ASSISTANCE/DEVICE Description: STG Adhere to Safety Precautions With cues/reminders supervision Assistance/Device. Outcome: Progressing   Problem: RH SAFETY Goal: RH STG DECREASED RISK OF FALL WITH ASSISTANCE Description: STG Decreased Risk of Fall With cues/reminders supervision Assistance. Outcome: Progressing

## 2018-12-16 NOTE — Progress Notes (Signed)
Physical Therapy Session Note  Patient Details  Name: Stacy Cook MRN: [id number] Date of Birth: 05-07-1935  Today's Date: 12/16/2018 PT Individual Time: 1022-1117 PT Individual Time Calculation (min): 55 min   Short Term Goals: Week 1:  PT Short Term Goal 1 (Week 1): Pt will initiate stair training PT Short Term Goal 2 (Week 1): Pt will perform transfers w/ CGA PT Short Term Goal 3 (Week 1): Pt will tolerate 60 min of upright activity w/o increase in fatigue PT Short Term Goal 4 (Week 1): Pt will ambulate 50' w/ min assist using LRAD  Skilled Therapeutic Interventions/Progress Updates:    NMR to address blocked practice sit <> stands without AD for support with focus on controlled movement and weightbearing/forced use of RLE with overall CGA and facilitation to activate through RLE in stance x 5 reps. Standing pre-gait for forward and backward stepping of LLE for focus on control in stance of RLE and increasing weightshifting to the R. Gait forward and backward with RW and R hand orthosis with focus on coordination and controlled placement of RLE, postural control to decrease compensatory trunk strategy when retro-stepping, and cues for positioning of RW x 10' each direction x 3 trials. No UE support, toe taps x 10 on R for activation of hip flexion and controlled placement of foot for carryover to gait, then x 10 reps on L for stance control on R - overall min assist for both with more min assist when stepping up with LLE.  Gait training with focus on R heel strike, hip flexion activation vs circumduction, and upright posture x 50', x 50', and then x 150' with overall min assist to CGA with increased toe drag noted as fatigued. Pt may benefit from toe cap. Pt making excellent progress and very goal directed to succeed. End of session transferred back to bed with CGA and returned to supine with supervision. Pt performed various transfers throughout session with overall CGA for  balance.  Therapy Documentation Precautions:  Precautions Precautions: ICD/Pacemaker, Fall, Other (comment)(seizures) Precaution Comments: right hemiparesis Restrictions Weight Bearing Restrictions: No    Pain: Denies pain.   Therapy/Group: Individual Therapy  Canary Brim Ivory Broad, PT, DPT, CBIS  12/16/2018, 12:07 PM

## 2018-12-17 ENCOUNTER — Inpatient Hospital Stay (HOSPITAL_COMMUNITY): Payer: Medicare Other

## 2018-12-17 ENCOUNTER — Inpatient Hospital Stay (HOSPITAL_COMMUNITY): Payer: Medicare Other | Admitting: Occupational Therapy

## 2018-12-17 ENCOUNTER — Inpatient Hospital Stay (HOSPITAL_COMMUNITY): Payer: Medicare Other | Admitting: Physical Therapy

## 2018-12-17 MED ORDER — METHOCARBAMOL 500 MG PO TABS
500.0000 mg | ORAL_TABLET | Freq: Three times a day (TID) | ORAL | Status: DC | PRN
  Administered 2018-12-17 – 2018-12-22 (×3): 500 mg via ORAL
  Filled 2018-12-17 (×5): qty 1
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
## 2018-12-17 NOTE — Plan of Care (Signed)
  Problem: Consults Goal: RH STROKE PATIENT EDUCATION Description: See Patient Education module for education specifics  Outcome: Progressing   Problem: RH BOWEL ELIMINATION Goal: RH STG MANAGE BOWEL WITH ASSISTANCE Description: STG Manage Bowel with  mod I Assistance. Outcome: Progressing   Problem: RH SAFETY Goal: RH STG ADHERE TO SAFETY PRECAUTIONS W/ASSISTANCE/DEVICE Description: STG Adhere to Safety Precautions With cues/reminders supervision Assistance/Device. Outcome: Progressing Goal: RH STG DECREASED RISK OF FALL WITH ASSISTANCE Description: STG Decreased Risk of Fall With cues/reminders supervision Assistance. Outcome: Progressing   Problem: RH KNOWLEDGE DEFICIT Goal: RH STG INCREASE KNOWLEDGE OF HYPERTENSION Description: Pt will be able to manage HTN using medications and dietary restrictions following handouts and educational information independently Outcome: Progressing Goal: RH STG INCREASE KNOWLEGDE OF HYPERLIPIDEMIA Description:  Pt will be able to manage HLD using medications and dietary restrictions following handouts and educational information independently Outcome: Progressing Goal: RH STG INCREASE KNOWLEDGE OF STROKE PROPHYLAXIS Description: Pt will be able to manage secondary stroke prevention using handouts and educational information independently Outcome: Progressing

## 2018-12-17 NOTE — Progress Notes (Signed)
[place] PHYSICAL MEDICINE & REHABILITATION PROGRESS NOTE   Subjective/Complaints: Still having liquidy to mushy stools. No fever, odor.  ROS: Patient denies fever, rash, sore throat, blurred vision, nausea, vomiting, cough, shortness of breath or chest pain, joint or back pain, headache, or mood change.    Objective:   No results found. Recent Labs    12/15/18 1140  WBC 3.2*  HGB 8.0*  HCT 24.0*  PLT 54*   Recent Labs    12/15/18 1140  NA 141  K 4.2  CL 110  CO2 23  GLUCOSE 106*  BUN 31*  CREATININE 1.46*  CALCIUM 8.9   No intake or output data in the 24 hours ending 12/17/18 0913   Physical Exam: Vital Signs Blood pressure (!) 150/37, pulse 65, temperature 98.3 F (36.8 C), temperature source Oral, resp. rate 17, height 5\' 1"  (1.549 m), weight 47.8 kg, SpO2 99 %.  Constitutional: No distress . Vital signs reviewed. HEENT: EOMI, oral membranes moist Neck: supple Cardiovascular: RRR without murmur. No JVD    Respiratory: CTA Bilaterally without wheezes or rales. Normal effort    GI: BS +, non-tender, non-distended  Musculoskeletal:Normal range of motion.  General: No deformityor edema.  Neurological: She is alertand oriented to person, place, and time. Right facial weakness with mild dysarthria. RUE   1/5 biceps, delt, triceps wrist and finger flexion. RLE 2- to 2/5 except for ADF which is tr to absent. Senses pain and light touch in all 4. Fair insight and awareness. Normal language. Follows all simple commands. Skin: Skin iswarmand dry. She isnot diaphoretic.  scattered bruisingface/limbs improving Psychiatric: in good spirits.      Assessment/Plan: 1. 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  Physiatrist is providing close team supervision and 24 hour management of  active medical problems listed below.  Physiatrist and rehab team continue to assess barriers to discharge/monitor patient progress toward functional and medical goals  Care Tool:  Bathing  Bathing activity did not occur: Refused Body parts bathed by patient: Right arm, Chest, Abdomen, Right upper leg, Left upper leg, Face, Front perineal area, Buttocks   Body parts bathed by helper: Left arm, Right lower leg, Left lower leg     Bathing assist Assist Level: Minimal Assistance - Patient > 75%     Upper Body Dressing/Undressing Upper body dressing Upper body dressing/undressing activity did not occur (including orthotics): Safety/medical concerns What is the patient wearing?: Pull over shirt    Upper body assist Assist Level: Minimal Assistance - Patient > 75%    Lower Body Dressing/Undressing Lower body dressing      What is the patient wearing?: Underwear/pull up, Pants     Lower body assist Assist for lower body dressing: Minimal Assistance - Patient > 75%     Toileting Toileting Toileting Activity did not occur (Clothing management and hygiene only): N/A (no void or bm)  Toileting assist Assist for toileting: Minimal Assistance - Patient > 75%     Transfers Chair/bed transfer  Transfers assist     Chair/bed transfer assist level: Contact Guard/Touching assist     Locomotion Ambulation   Ambulation assist   Ambulation activity did not occur: Safety/medical concerns  Assist level: Minimal Assistance - Patient > 75% Assistive device: Walker-rolling Max distance: 150'   Walk 10 feet activity   Assist  Walk 10 feet  activity did not occur: Safety/medical concerns  Assist level: Minimal Assistance - Patient > 75% Assistive device: Walker-rolling   Walk 50 feet activity   Assist Walk 50 feet with 2 turns activity did not occur: Safety/medical concerns  Assist level: Minimal Assistance - Patient > 75% Assistive device: Walker-rolling    Walk 150  feet activity   Assist Walk 150 feet activity did not occur: Safety/medical concerns  Assist level: Minimal Assistance - Patient > 75% Assistive device: Walker-rolling    Walk 10 feet on uneven surface  activity   Assist Walk 10 feet on uneven surfaces activity did not occur: Safety/medical concerns         Wheelchair     Assist Will patient use wheelchair at discharge?: Yes Type of Wheelchair: Manual    Wheelchair assist level: Supervision/Verbal cueing Max wheelchair distance: 150    Wheelchair 50 feet with 2 turns activity    Assist        Assist Level: Supervision/Verbal cueing   Wheelchair 150 feet activity     Assist  Wheelchair 150 feet activity did not occur: Safety/medical concerns   Assist Level: Supervision/Verbal cueing   Blood pressure (!) 150/37, pulse 65, temperature 98.3 F (36.8 C), temperature source Oral, resp. rate 17, height 5\' 1"  (1.549 m), weight 47.8 kg, SpO2 99 %.   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 Pt required transfer to acute d/t seizures and heart block --Continue CIR therapies including PT, OT, and SLP   -ELOS 9/19 2. Antithrombotics: -DVT/anticoagulation:Mechanical:Sequential compression devices, below kneeBilateral lower extremitiesdue to low platelets. -antiplatelet therapy: DAPTwith ASA and plavix 3. Pain Management:tylenol prn. 4. Mood:LCSW to follow for evaluation and support. -antipsychotic agents: N/A 5. Neuropsych: This patientiscapable of making decisions on herown behalf. 6. Skin/Wound Care:Monitor groin incisions for healing. Monitor right groin hematoma. 7. Fluids/Electrolytes/Nutrition:Monitor I/O.encourage PO 8. TAVR/CHB s/p PPM: On ASA/Plavix due to recent TAVR.   -metoprolol still on hold 9. New onset focal motor seizures: Continue  Keppra bid X 3 months. Follow up with Dr. Felecia Shelling after discharge.  11. Constipation:stools have remained loose, no frank diarrhea. Does not appear infectious  -off senna   -stop Fe+ supp  -continue probiotic and fiber 12. Acute on chronic anemia: Monitor for signs of bleeding. Monitor H/H trend has been around 7-8 range overall. consider transfusion if </= 7.0.   9/7- Hb 8.0  -recheck Tomorrow 13. Acute on chronic renal failure: Hyperkalemia resolved--recheck labs in am.ReportsSCr1.9 one month PTA.  9/7- Cr up slightly to 1.46 but BUN down to 31  -recheck tomorrow 14. Chronic diastolic CHF: Monitor for signs of overload.   Continue Imdur, ASA and   Filed Weights   12/15/18 0309 12/16/18 0338 12/17/18 0500  Weight: 48.8 kg 48.7 kg 47.8 kg    15. Metastatic lung cancer/chronic right pleural effusion: On Tagrisso. Effusion stable.  16. Acute on chronic thrombocytopenia: Baseline around 50-->38.   9/6- Plts 49k - stable 17. Neutropenia: Has been in 2-3 range--monitor ANC for now.  9/6- WBC   to 2.8k 18. Muscle cramps/spasms- 9/5- has Zanaflex 2 mg q8 hours prn-helpful but it may be exacerbating fluctations in bp  9/6- improved muscle spasms when she uses med 19. HTN- bp 141/32--  -DBP has been running quite low but systolic robust, fluctuating   -continue with current meds for now  -cardiology following from dx  -see above       LOS:  5 days A FACE TO FACE EVALUATION WAS PERFORMED  Meredith Staggers 12/17/2018, 9:13 AM

## 2018-12-17 NOTE — Progress Notes (Signed)
Occupational Therapy Session Note  Patient Details  Name: Stacy Cook MRN: [id number] Date of Birth: May 08, 1935  Today's Date: 12/17/2018 OT Individual Time: 0920-1000 OT Individual Time Calculation (min): 40 min    Short Term Goals: Week 1:  OT Short Term Goal 1 (Week 1): Pt will complete UB dressing to donn a pullover shirt with min assist following hemidressing techniques. OT Short Term Goal 2 (Week 1): Pt will complete bathing sit to stand with min assist. OT Short Term Goal 3 (Week 1): Pt will peform toilet transfer with min assist stand pivot OT Short Term Goal 4 (Week 1): Pt will use the RUE as a gross assist with self care and grooming tasks with mod facilitation. OT Short Term Goal 5 (Week 1): Pt will complete LB dressing with mod assist for all aspects sit to stand.  Skilled Therapeutic Interventions/Progress Updates:    1:1 NMR with right UE promoting normal patterns of movement with focus on initiation of movement in all planes and joints. Beginning working with Public librarian; pt able elicit  Shoulder flexion/ shoulder protraction/retraction, shoulder adduction/ abduction.   Pt able to elicit Trace grasp with no extension detected. Pt able to perform supination/ pronation with min A for support.  Pt able to also demonstrate active elbow extension with slight resistance with weight bearing- coming down onto her forearm and with support able to push through right LE to come into sitting . Also able to push on therapist shoulder (againist slight resistance) coming into elbow full extension.   Functional tasks with min A to engage right UE with bilateral tasks- ie hitting boom whackers together. Pt able to demonstrate stand pivot transfers with contact guard in/ out of w/c  Therapy Documentation Precautions:  Precautions Precautions: ICD/Pacemaker, Fall, Other (comment)(seizures) Precaution Comments: right hemiparesis Restrictions Weight Bearing Restrictions: No RUE Weight  Bearing: Non weight bearing RLE Weight Bearing: Weight bearing as tolerated LLE Weight Bearing: Weight bearing as tolerated Pain: Pain Assessment Pain Scale: 0-10 Pain Score: 0-No pain   Therapy/Group: Individual Therapy  Stacy Cook [facility] 12/17/2018, 10:17 AM

## 2018-12-17 NOTE — Progress Notes (Signed)
Occupational Therapy Session Note  Patient Details  Name: Stacy Cook MRN: [id number] Date of Birth: [birth year]  Today's Date: 12/17/2018 OT Individual Time: 5093-2671 OT Individual Time Calculation (min): 73 min    Short Term Goals: Week 1:  OT Short Term Goal 1 (Week 1): Pt will complete UB dressing to donn a pullover shirt with min assist following hemidressing techniques. OT Short Term Goal 2 (Week 1): Pt will complete bathing sit to stand with min assist. OT Short Term Goal 3 (Week 1): Pt will peform toilet transfer with min assist stand pivot OT Short Term Goal 4 (Week 1): Pt will use the RUE as a gross assist with self care and grooming tasks with mod facilitation. OT Short Term Goal 5 (Week 1): Pt will complete LB dressing with mod assist for all aspects sit to stand.  Skilled Therapeutic Interventions/Progress Updates:    1:1. Pt recievd in bed agreeable ot shower and no report of pain. Pt completes supine>sitting EOB with MIN A for trunk elevation and MIN A for stand pivot EOB>w/c>toilet>TTB in shower with VC for hand placement. Pt completes bathing with A to wash B feet and LUE and MIN A for standing balance. Pt dresses UB with MIN A recalling HEMI dressing techniques with no VC and LB dressing with MIN A. Pt able to thread BLE into pants and don B socks withMIN A to maintain RLE into crossed position. Pt stands with MIN A to complete oral car with RUE facilitated into WB position. Pt completes towel glides with min HOH A for full range activation. Pt able to activate shoulder flex/ext, horizontal ab/adduction and elbow flex/ext. Exited session with pt seated in w/c, set up with breakfast, belt alarm on and call light in reach  Therapy Documentation Precautions:  Precautions Precautions: ICD/Pacemaker, Fall, Other (comment)(seizures) Precaution Comments: right hemiparesis Restrictions Weight Bearing Restrictions: No RUE Weight Bearing: Non weight bearing RLE Weight Bearing:  Weight bearing as tolerated LLE Weight Bearing: Weight bearing as tolerated General:   Vital Signs: Therapy Vitals Temp: 98.3 F (36.8 C) Temp Source: Oral Pulse Rate: 65 Resp: 17 BP: (!) 150/37 Patient Position (if appropriate): Lying Oxygen Therapy SpO2: 99 % O2 Device: Room Air Pain:   ADL: ADL Grooming: Supervision/safety Where Assessed-Grooming: Wheelchair, Sitting at sink Where Assessed-Upper Body Dressing: Wheelchair Lower Body Dressing: Maximal assistance Where Assessed-Lower Body Dressing: Wheelchair, Standing at sink, Sitting at [employer] Transfer: Minimal assistance Vision   Perception    Praxis   Exercises:   Other Treatments:     Therapy/Group: Individual Therapy  Tonny Branch 12/17/2018, 7:55 AM

## 2018-12-17 NOTE — Progress Notes (Signed)
Physical Therapy Session Note  Patient Details  Name: Stacy Cook MRN: [id number] Date of Birth: June 04, 1935  Today's Date: 12/17/2018 PT Individual Time: 1110-1150 AND 1345-1425 PT Individual Time Calculation (min): 40 min AND 40 min  Short Term Goals: Week 1:  PT Short Term Goal 1 (Week 1): Pt will initiate stair training PT Short Term Goal 2 (Week 1): Pt will perform transfers w/ CGA PT Short Term Goal 3 (Week 1): Pt will tolerate 60 min of upright activity w/o increase in fatigue PT Short Term Goal 4 (Week 1): Pt will ambulate 50' w/ min assist using LRAD  Skilled Therapeutic Interventions/Progress Updates:   Session 1:  Pt in w/c and agreeable to therapy, no c/o pain. Total assist w/c transport to/from therapy gym. Worked on static standing balance and postural control this session. Stood to perform LUE reaching tasks emphasizing postural control w/ trunk rotation, R lateral weight shifting, and anterior weight shifting. Min assist overall w/ verbal and tactile cues for hip balance strategies. Stood to toss bean bags to [employer] w/ progressively lower BOS. Min assist w/ multiple LOBs that pt was able to self-correct w/ UE support on chair despite encouragement from therapist to challenge herself w/o UE support. Sit<>stands from w/c w/o UE support, L hand-over-R hand to facilitate increased RLE WB and use x5 reps. Returned to room and ended session in supine, all needs in reach.   Session 2:  Pt in w/c and agreeable to therapy, denies pain. RN present taking vitals and reports low diastolic. Per chart review, this is normal for pt. RN agreeable for session to continue while she speaks w/ PA. Ambulated to/from therapy gym w/ CGA using RW, RUE orthosis, and RAFO. 1 seated rest break during each bout. Practiced negotiating 4 steps w/ unilateral rail and CGA as well, verbal cues for pattern. Son present to observe session. Educated him on pt's CLOF and expected level of function at d/c.  Son Patent attorney. Provided w/ fall risk checklist for home to assess home environmental safety. Returned to room and ended session in supine, all needs in reach. Heat pack applied to R shoulder for soreness relief.   Therapy Documentation Precautions:  Precautions Precautions: ICD/Pacemaker, Fall, Other (comment)(seizures) Precaution Comments: right hemiparesis Restrictions Weight Bearing Restrictions: No RUE Weight Bearing: Non weight bearing RLE Weight Bearing: Weight bearing as tolerated LLE Weight Bearing: Weight bearing as tolerated Pain: Pain Assessment Pain Scale: 0-10 Pain Score: 0-No pain  Therapy/Group: Individual Therapy  Braxson Hollingsworth K Jalaiyah Throgmorton 12/17/2018, 12:07 PM

## 2018-12-18 ENCOUNTER — Inpatient Hospital Stay (HOSPITAL_COMMUNITY): Payer: Medicare Other | Admitting: Occupational Therapy

## 2018-12-18 ENCOUNTER — Inpatient Hospital Stay (HOSPITAL_COMMUNITY): Payer: Medicare Other | Admitting: Physical Therapy

## 2018-12-18 DIAGNOSIS — I350 Nonrheumatic aortic (valve) stenosis: Secondary | ICD-10-CM

## 2018-12-18 DIAGNOSIS — Z954 Presence of other heart-valve replacement: Secondary | ICD-10-CM

## 2018-12-18 LAB — BASIC METABOLIC PANEL
Anion gap: 7 (ref 5–15)
BUN: 31 mg/dL — ABNORMAL HIGH (ref 8–23)
CO2: 23 mmol/L (ref 22–32)
Calcium: 8.6 mg/dL — ABNORMAL LOW (ref 8.9–10.3)
Chloride: 109 mmol/L (ref 98–111)
Creatinine, Ser: 1.55 mg/dL — ABNORMAL HIGH (ref 0.44–1.00)
GFR calc Af Amer: 36 mL/min — ABNORMAL LOW (ref >60)
GFR calc non Af Amer: 31 mL/min — ABNORMAL LOW (ref >60)
Glucose, Bld: 92 mg/dL (ref 70–99)
Potassium: 3.9 mmol/L (ref 3.5–5.1)
Sodium: 139 mmol/L (ref 135–145)

## 2018-12-18 LAB — CBC
HCT: 21.5 % — ABNORMAL LOW (ref 36.0–46.0)
Hemoglobin: 7 g/dL — ABNORMAL LOW (ref 12.0–15.0)
MCH: 30.6 pg (ref 26.0–34.0)
MCHC: 32.6 g/dL (ref 30.0–36.0)
MCV: 93.9 fL (ref 80.0–100.0)
Platelets: 48 10*3/uL — ABNORMAL LOW (ref 150–400)
RBC: 2.29 MIL/uL — ABNORMAL LOW (ref 3.87–5.11)
RDW: 13.4 % (ref 11.5–15.5)
WBC: 2.7 10*3/uL — ABNORMAL LOW (ref 4.0–10.5)
nRBC: 0 % (ref 0.0–0.2)

## 2018-12-18 MED ORDER — SACCHAROMYCES BOULARDII 250 MG PO CAPS
500.0000 mg | ORAL_CAPSULE | Freq: Two times a day (BID) | ORAL | Status: DC
  Administered 2018-12-18 – 2018-12-27 (×18): 500 mg via ORAL
  Filled 2018-12-18 (×18): qty 2

## 2018-12-18 NOTE — Progress Notes (Signed)
Cardiothoracic Surgery  Subjective:  No specific complaints. Just wants to keep making progress.  Objective: Vital signs in last 24 hours: Temp:  [98.4 F (36.9 C)-98.6 F (37 C)] 98.6 F (37 C) (09/10 1351) Pulse Rate:  [54-65] 59 (09/10 1353) Resp:  [16-17] 16 (09/10 1351) BP: (117-137)/(31-41) 117/41 (09/10 1353) SpO2:  [99 %] 99 % (09/10 1351) Weight:  [47.8 kg] 47.8 kg (09/10 0451)  Hemodynamic parameters for last 24 hours:    Intake/Output from previous day: 09/09 0701 - 09/10 0700 In: 240 [P.O.:240] Out: -  Intake/Output this shift: Total I/O In: 478 [P.O.:478] Out: -   General appearance: alert and cooperative Neurologic: right hemiparesis. She has 1/5 right wrist and finger flexion which is new since the last time I saw her.  Heart: regular rate and rhythm Lungs: clear to auscultation bilaterally Extremities: extremities normal, atraumatic, no cyanosis or edema Wound: groin sites ok  Lab Results: Recent Labs    12/18/18 0516  WBC 2.7*  HGB 7.0*  HCT 21.5*  PLT 48*   BMET:  Recent Labs    12/18/18 0516  NA 139  K 3.9  CL 109  CO2 23  GLUCOSE 92  BUN 31*  CREATININE 1.55*  CALCIUM 8.6*    PT/INR: No results for input(s): LABPROT, INR in the last 72 hours. ABG    Component Value Date/Time   PHART 7.374 11/28/2018 1402   HCO3 22.0 11/28/2018 1402   TCO2 22 12/02/2018 1600   ACIDBASEDEF 2.4 (H) 11/28/2018 1402   O2SAT 98.0 11/28/2018 1402   CBG (last 3)  Recent Labs    12/15/18 1732 12/15/18 2114 12/16/18 0604  GLUCAP 76 98 97    Assessment/Plan:  S/p TAVR with periop embolic stroke to left pons and frontal lobe.  Postop high degree heart block with pauses and syncope. S/p leadless ppm.  Continuing CIR therapies.    LOS: 6 days    Gaye Pollack 12/18/2018

## 2018-12-18 NOTE — Progress Notes (Signed)
[place] PHYSICAL MEDICINE & REHABILITATION PROGRESS NOTE   Subjective/Complaints: Still having soft am stools.   Objective:   No results found. Recent Labs    12/15/18 1140 12/18/18 0516  WBC 3.2* 2.7*  HGB 8.0* 7.0*  HCT 24.0* 21.5*  PLT 54* 48*   Recent Labs    12/15/18 1140 12/18/18 0516  NA 141 139  K 4.2 3.9  CL 110 109  CO2 23 23  GLUCOSE 106* 92  BUN 31* 31*  CREATININE 1.46* 1.55*  CALCIUM 8.9 8.6*    Intake/Output Summary (Last 24 hours) at 12/18/2018 1037 Last data filed at 12/18/2018 0852 Gross per 24 hour  Intake 478 ml  Output -  Net 478 ml     Physical Exam: Vital Signs Blood pressure (!) 119/31, pulse (!) 54, temperature 98.5 F (36.9 C), temperature source Oral, resp. rate 17, height 5\' 1"  (1.549 m), weight 47.8 kg, SpO2 99 %.  Constitutional: No distress . Vital signs reviewed. HEENT: EOMI, oral membranes moist Neck: supple Cardiovascular: RRR without murmur. No JVD    Respiratory: CTA Bilaterally without wheezes or rales. Normal effort    GI: BS +, non-tender, non-distended  Musculoskeletal:Normal range of motion.  General: No deformityor edema.  Neurological: She is alertand oriented to person, place, and time. Right facial weakness with mild dysarthria. RUE   1/5 biceps, delt, triceps wrist and finger flexion--stable. RLE 2- to 2/5 except for ADF which is tr to absent. Senses pain and light touch in all 4. Fair insight and awareness. Normal language. Follows all simple commands. Skin: Skin iswarmand dry. She isnot diaphoretic.  scattered bruisingface/limbs all resolving Psychiatric: in good spirits.      Assessment/Plan: 1. 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  Physiatrist is providing close team supervision and 24 hour management of  active medical problems listed below.  Physiatrist and rehab team continue to assess barriers to discharge/monitor patient progress toward functional and medical goals  Care Tool:  Bathing  Bathing activity did not occur: Refused Body parts bathed by patient: Right arm, Chest, Abdomen, Right upper leg, Left upper leg, Face, Front perineal area, Buttocks   Body parts bathed by helper: Left arm, Right lower leg, Left lower leg     Bathing assist Assist Level: Minimal Assistance - Patient > 75%     Upper Body Dressing/Undressing Upper body dressing Upper body dressing/undressing activity did not occur (including orthotics): Safety/medical concerns What is the patient wearing?: Pull over shirt    Upper body assist Assist Level: Minimal Assistance - Patient > 75%    Lower Body Dressing/Undressing Lower body dressing      What is the patient wearing?: Underwear/pull up, Pants     Lower body assist Assist for lower body dressing: Minimal Assistance - Patient > 75%     Toileting Toileting Toileting Activity did not occur (Clothing management and hygiene only): N/A (no void or bm)  Toileting assist Assist for toileting: Minimal Assistance - Patient > 75%     Transfers Chair/bed transfer  Transfers assist     Chair/bed transfer assist level: Contact Guard/Touching assist     Locomotion Ambulation   Ambulation assist   Ambulation activity did not occur: Safety/medical concerns  Assist level: Contact Guard/Touching assist Assistive device: Walker-rolling Max distance: 75'   Walk 10 feet activity   Assist  Walk 10 feet activity did not occur: Safety/medical concerns  Assist level: Contact Guard/Touching assist Assistive device: Walker-rolling   Walk 50 feet activity   Assist Walk 50 feet with 2 turns activity did not occur: Safety/medical concerns  Assist level: Contact Guard/Touching assist Assistive device: Walker-rolling    Walk 150 feet  activity   Assist Walk 150 feet activity did not occur: Safety/medical concerns  Assist level: Contact Guard/Touching assist Assistive device: Walker-rolling    Walk 10 feet on uneven surface  activity   Assist Walk 10 feet on uneven surfaces activity did not occur: Safety/medical concerns         Wheelchair     Assist Will patient use wheelchair at discharge?: Yes Type of Wheelchair: Manual    Wheelchair assist level: Supervision/Verbal cueing Max wheelchair distance: 150    Wheelchair 50 feet with 2 turns activity    Assist        Assist Level: Supervision/Verbal cueing   Wheelchair 150 feet activity     Assist  Wheelchair 150 feet activity did not occur: Safety/medical concerns   Assist Level: Supervision/Verbal cueing   Blood pressure (!) 119/31, pulse (!) 54, temperature 98.5 F (36.9 C), temperature source Oral, resp. rate 17, height 5\' 1"  (1.549 m), weight 47.8 kg, SpO2 99 %.   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 Pt required transfer to acute d/t seizures and heart block --Continue CIR therapies including PT, OT, and SLP   -ELOS 9/19 2. Antithrombotics: -DVT/anticoagulation:Mechanical:Sequential compression devices, below kneeBilateral lower extremitiesdue to low platelets. -antiplatelet therapy: DAPTwith ASA and plavix 3. Pain Management:tylenol prn. 4. Mood:LCSW to follow for evaluation and support. -antipsychotic agents: N/A 5. Neuropsych: This patientiscapable of making decisions on herown behalf. 6. Skin/Wound Care:Monitor groin incisions for healing. Monitor right groin hematoma. 7. Fluids/Electrolytes/Nutrition:Monitor I/O.encourage PO 8. TAVR/CHB s/p PPM: On ASA/Plavix due to recent TAVR.   -metoprolol still on hold 9. New onset focal motor seizures: Continue Keppra bid  X 3 months. Follow up with Dr. Felecia Shelling after discharge.  11. Loose stools:stools have remained mushy, sometimes loose, no frank diarrhea. Does not appear infectious. Most likely cause is her tagrisso  -off senna   -stopped Fe+ supp  -continue probiotic and fiber, increase probiotic to 500mg  bid 12. Acute on chronic anemia: Monitor for signs of bleeding. Monitor H/H trend has been around 7-8 range overall. consider transfusion if </= 7.0.   9/7- Hb 8.0--->9/10 7.0  -recheck tomorrow---transfuse if needed.  13. Acute on chronic renal failure (CKD III): Hyperkalemia resolved--  -ReportsSCr1.9 one month PTA.  9/10- Cr up slightly to 1.55 but BUN holding at 31  -recheck next week 14. Chronic diastolic CHF: Monitor for signs of overload.   Continue Imdur, ASA and   Filed Weights   12/16/18 0338 12/17/18 0500 12/18/18 0451  Weight: 48.7 kg 47.8 kg 47.8 kg    -weights stable 15. Metastatic lung cancer/chronic right pleural effusion: On Tagrisso. Effusion stable.  16. Acute on chronic thrombocytopenia: Baseline around 50-->38.   9/10- Plts 48k - stable 17. Neutropenia: Has been in 2-3 range--monitor ANC for now.  9/10- WBC   to 2.7k 18. Muscle cramps/spasms- 9/5- has Zanaflex 2 mg q8 hours prn-helpful but it may be exacerbating fluctations in bp  9/6- improved muscle spasms when she uses med 19. HTN- bp 141/32--  -DBP has been running quite low but systolic robust, fluctuating   -continue with current meds  for now  -cardiology following from dx  -will not resume metoprolol at this time d/t soft bp's       LOS: 6 days A FACE TO [place] 12/18/2018, 10:37 AM

## 2018-12-18 NOTE — Progress Notes (Signed)
Physical Therapy Session Note  Patient Details  Name: Stacy Cook MRN: [id number] Date of Birth: 06-13-1935  Today's Date: 12/18/2018 PT Individual Time: 0705-0755 PT Individual Time Calculation (min): 50 min   Short Term Goals: Week 1:  PT Short Term Goal 1 (Week 1): Pt will initiate stair training PT Short Term Goal 2 (Week 1): Pt will perform transfers w/ CGA PT Short Term Goal 3 (Week 1): Pt will tolerate 60 min of upright activity w/o increase in fatigue PT Short Term Goal 4 (Week 1): Pt will ambulate 50' w/ min assist using LRAD  Skilled Therapeutic Interventions/Progress Updates:   Pt in supine and agreeable to therapy, no c/o pain. Supine>sit w/ min assist and donned LE garments and shoes w/ total assist for time management. Stand pivot to w/c w/ CGA. Toilet transfer w/ min assist prior to leaving room, continent of void. Total assist w/c transport to/from therapy gym. Worked on postural control w/ R lateral weight shifting in stance. Stood to perform card matching task w/ LUE reaching to far R for cards. 4-5 cards at a time w/ min assist for balance, tactile and verbal cues for R knee extension w/ weight acceptance. R knee tends to flex w/ prolonged standing and when dual task is introduced. L step taps to 2" step w/o UE support, min-mod assist and tactile, verbal, and manual cues for R quad activation and R weight shifting. Pt w/ multiple posterior LOB during this task, encouragement to trust her body to stay upright w/ anterior weight shifting. Ambulated back towards room w/ RW and CGA, RUE orthosis and RAFO, x75'. Pt w/ intermittent c/o fatigue this morning, attributes to being up part of the night w/ diarrhea. Pt also feels her RLE is much "heavier" this morning, although pt w/ no functional changes compared to yesterday. Pt requesting to return remainder of distance to room via w/c. Educated on importance of hydration, especially w/ diarrhea, pt verbalized understanding and  agreeable to drink water although she "isn't a water drinker". Returned to room and ended session in w/c, all needs in reach. Encouraged to eat bites of breakfast as well. Missed 10 min of skilled PT 2/2 fatigue.   Therapy Documentation Precautions:  Precautions Precautions: ICD/Pacemaker, Fall, Other (comment)(seizures) Precaution Comments: right hemiparesis Restrictions Weight Bearing Restrictions: No RUE Weight Bearing: Non weight bearing RLE Weight Bearing: Weight bearing as tolerated LLE Weight Bearing: Weight bearing as tolerated Vital Signs: Therapy Vitals Temp: 98.5 F (36.9 C) Temp Source: Oral Pulse Rate: (!) 54 Resp: 17 BP: (!) 119/31 Patient Position (if appropriate): Lying Oxygen Therapy SpO2: 99 % O2 Device: Room Air  Therapy/Group: Individual Therapy  [name] 12/18/2018, 8:00 AM

## 2018-12-18 NOTE — Progress Notes (Signed)
Occupational Therapy Session Note  Patient Details  Name: Stacy Cook MRN: [id number] Date of Birth: [birth year]  Today's Date: 12/18/2018  Session 1 OT Individual Time: 1505-6979 OT Individual Time Calculation (min): 59 min   Session 2 OT Individual Time: 1415-1530 OT Individual Time Calculation (min): 75 min    Short Term Goals: Week 1:  OT Short Term Goal 1 (Week 1): Pt will complete UB dressing to donn a pullover shirt with min assist following hemidressing techniques. OT Short Term Goal 2 (Week 1): Pt will complete bathing sit to stand with min assist. OT Short Term Goal 3 (Week 1): Pt will peform toilet transfer with min assist stand pivot OT Short Term Goal 4 (Week 1): Pt will use the RUE as a gross assist with self care and grooming tasks with mod facilitation. OT Short Term Goal 5 (Week 1): Pt will complete LB dressing with mod assist for all aspects sit to stand.  Skilled Therapeutic Interventions/Progress Updates:  Session 1   Pt greeted transitioning out of the bathroom with nurse tech. Pt declined bathing/dressing, stating she had to wash up and change clothes at 2:30 this am 2/2 having bowel accident in the bed. Pt brought to therapy gym in wc. Worked on R UE NMR with towel pushes and improved shoulder activation today. Progressed to isolated forearm pronation/supination =. Pt needed assistance to achieve supination, then able to pronate. Sit<>stand with CGA-continued R UE NMR with standing weight bearing pushes.  Pt returned to room and left seated in wc with alarm belt on and needs met.   Session 2 Pt greeted seated in wc with son present and agreeable to OT treatment session. Pt declined need to go to the bathroom right now. Pt brought down to therapy gym in wc. [employer] arm bike for R UE NMR. OT placed ace wrap on R hand to maintain grasp on handle. Focus on R shoulder activation. Stand-pivot wc to therapy mat with CGA. R UE placed in UE ranger with pt able to control R  shoulder movement to hit therapists hand in 3 places. Worked on grasp with pt able to grasp cup, then OT supported elbow/wrist to guide cup and stack in L hand. Pt reported need to go to the bathroom, so pt returned to room. Min A stand-pivot to toilet, then pt was able to manage clothing. Pt voided bladder and completed peri-care, CGA to returned to wc. CGA to pivot to bed and min A to doff shoes, AFO, and TED hose. Pt left semi-reclined in bed with bed alarm on and son present. Needs met.    Therapy Documentation Precautions:  Precautions Precautions: ICD/Pacemaker, Fall, Other (comment)(seizures) Precaution Comments: right hemiparesis Restrictions Weight Bearing Restrictions: No RUE Weight Bearing: Non weight bearing RLE Weight Bearing: Weight bearing as tolerated LLE Weight Bearing: Weight bearing as tolerated Pain:  denies pain   Therapy/Group: Individual Therapy  Valma Cava 12/18/2018, 3:34 PM

## 2018-12-19 ENCOUNTER — Inpatient Hospital Stay (HOSPITAL_COMMUNITY): Payer: Medicare Other | Admitting: Occupational Therapy

## 2018-12-19 ENCOUNTER — Inpatient Hospital Stay (HOSPITAL_COMMUNITY): Payer: Medicare Other | Admitting: Physical Therapy

## 2018-12-19 DIAGNOSIS — I10 Essential (primary) hypertension: Secondary | ICD-10-CM

## 2018-12-19 LAB — CBC
HCT: 22.2 % — ABNORMAL LOW (ref 36.0–46.0)
Hemoglobin: 7.4 g/dL — ABNORMAL LOW (ref 12.0–15.0)
MCH: 30.8 pg (ref 26.0–34.0)
MCHC: 33.3 g/dL (ref 30.0–36.0)
MCV: 92.5 fL (ref 80.0–100.0)
Platelets: 53 10*3/uL — ABNORMAL LOW (ref 150–400)
RBC: 2.4 MIL/uL — ABNORMAL LOW (ref 3.87–5.11)
RDW: 13.4 % (ref 11.5–15.5)
WBC: 2.8 10*3/uL — ABNORMAL LOW (ref 4.0–10.5)
nRBC: 0 % (ref 0.0–0.2)

## 2018-12-19 LAB — RETICULOCYTES
Immature Retic Fract: 4.7 % (ref 2.3–15.9)
RBC.: 2.58 MIL/uL — ABNORMAL LOW (ref 3.87–5.11)
Retic Count, Absolute: 60.4 10*3/uL (ref 19.0–186.0)
Retic Ct Pct: 2.3 % (ref 0.4–3.1)

## 2018-12-19 LAB — IRON AND TIBC
Iron: 48 ug/dL (ref 28–170)
Saturation Ratios: 23 % (ref 10.4–31.8)
TIBC: 207 ug/dL — ABNORMAL LOW (ref 250–450)
UIBC: 159 ug/dL

## 2018-12-19 LAB — VITAMIN B12: Vitamin B-12: 1166 pg/mL — ABNORMAL HIGH (ref 180–914)

## 2018-12-19 LAB — FERRITIN: Ferritin: 358 ng/mL — ABNORMAL HIGH (ref 11–307)

## 2018-12-19 LAB — FOLATE: Folate: 9.1 ng/mL (ref >5.9)

## 2018-12-19 LAB — GLUCOSE, CAPILLARY: Glucose-Capillary: 92 mg/dL (ref 70–99)

## 2018-12-19 MED ORDER — DARBEPOETIN ALFA 40 MCG/0.4ML IJ SOSY
40.0000 ug | PREFILLED_SYRINGE | INTRAMUSCULAR | Status: AC
  Administered 2018-12-19 – 2018-12-26 (×2): 40 ug via SUBCUTANEOUS
  Filled 2018-12-19 (×3): qty 0.4

## 2018-12-19 MED ORDER — TEMAZEPAM 7.5 MG PO CAPS
7.5000 mg | ORAL_CAPSULE | Freq: Every evening | ORAL | Status: DC | PRN
  Administered 2018-12-19 – 2018-12-24 (×4): 7.5 mg via ORAL
  Filled 2018-12-19 (×4): qty 1

## 2018-12-19 NOTE — Progress Notes (Signed)
Physical Therapy Weekly Progress Note  Patient Details  Name: Stacy Cook MRN: [id number] Date of Birth: 1935-11-14  Beginning of progress report period: December 13, 2018 End of progress report period: December 19, 2018  Today's Date: 12/19/2018 PT Individual Time: 8185-6314 PT Individual Time Calculation (min): 53 min   Patient has met 3 of 4 short term goals. Pt is making excellent progress towards LTGs in light of her multiple comorbidities including stage IV lung cancer. She is performing all mobility w/ supervision-CGA including bed mobility, transfers, and gait w/ RW up to 150'. Higher level dynamic balance tasks require min assist to correct LOB, however this is slowly improving.   Patient continues to demonstrate the following deficits muscle weakness, decreased cardiorespiratoy endurance, unbalanced muscle activation and decreased coordination and decreased standing balance, decreased postural control, hemiplegia and decreased balance strategies and therefore will continue to benefit from skilled PT intervention to increase functional independence with mobility.  Patient progressing toward long term goals..  Continue plan of care.  PT Short Term Goals Week 1:  PT Short Term Goal 1 (Week 1): Pt will initiate stair training PT Short Term Goal 1 - Progress (Week 1): Met PT Short Term Goal 2 (Week 1): Pt will perform transfers w/ CGA PT Short Term Goal 2 - Progress (Week 1): Met PT Short Term Goal 3 (Week 1): Pt will tolerate 60 min of upright activity w/o increase in fatigue PT Short Term Goal 3 - Progress (Week 1): Progressing toward goal PT Short Term Goal 4 (Week 1): Pt will ambulate 50' w/ min assist using LRAD PT Short Term Goal 4 - Progress (Week 1): Met Week 2:  PT Short Term Goal 1 (Week 2): =LTGs due to ELOS  Skilled Therapeutic Interventions/Progress Updates:   Pt in supine and agreeable to therapy, no c/o pain but does report ongoing increased fatigue. Bed  mobility w/ supervision. Ambulated to/from toilet w/ CGA using RW, RUE orthosis and RAFO. Pt performed LE garment management and pericare w/o UE assist. Ambulated to/from therapy gym w/ CGA and 1 seated rest break each way, ~100-125' at a time. Pt c/o increased dizziness today as well, BP 123/24 and 144/37 upon re-check. Per PA, this is normal for pt and to watch for drop in systolic w/ dizziness. PA requesting PT to provide increased rest breaks. Worked on standing tolerance/balance while perform RUE tasks in therapy gym. Stacked foam blocks w/ hand-over-hand assist however pt performing pincer grasp and pronation/supination w/o assist. Verbal reminders to not compensate w/ trunk movement. Stood in 60-90 sec bouts before needing seated rest break. Returned to room and ended session in supine, all needs in reach. Educated pt on importance of hydration and eating, even when she did not have an appetite. Pt admitted to not eating breakfast and only eating some ice cream for dinner the evening prior. Agreeable to drink ensure and was provided w/ one.   Therapy Documentation Precautions:  Precautions Precautions: ICD/Pacemaker, Fall, Other (comment)(seizures) Precaution Comments: right hemiparesis Restrictions Weight Bearing Restrictions: No RUE Weight Bearing: Non weight bearing RLE Weight Bearing: Weight bearing as tolerated LLE Weight Bearing: Weight bearing as tolerated  Therapy/Group: Individual Therapy  Stacy Cook 12/19/2018, 9:57 AM

## 2018-12-19 NOTE — Progress Notes (Addendum)
[place] VALVE TEAM  Patient Name: Stacy Cook Date of Encounter: 12/19/2018  Primary Cardiologist:  Dr. Clayborn Bigness / Dr. Angelena Form & Dr. Cyndia Bent (TAVR)  Hospital Problem List     Principal Problem:   Embolic stroke [facility]) Active Problems:   Thrombocytopenia ([place])   Type 2 diabetes mellitus ([place])   Primary malignant neoplasm of right lower lobe of lung ([facility])   Anemia of chronic disease   Right hemiparesis ([facility])   Muscle spasm of right lower extremity   Neutropenia ([facility])     Subjective   Doing well. Had a great day two days ago. Now feels "more wobbly."  No CP or SOB. No more dizziness or syncope.   Inpatient Medications    Scheduled Meds: . aspirin  81 mg Oral Daily  . clopidogrel  75 mg Oral Daily  . isosorbide mononitrate  30 mg Oral Daily  . levETIRAcetam  500 mg Oral BID  . metoprolol succinate  50 mg Oral QPM  . osimertinib mesylate  80 mg Oral QPM  . polycarbophil  625 mg Oral Daily  . saccharomyces boulardii  500 mg Oral BID   Continuous Infusions:  PRN Meds: acetaminophen, alum & mag hydroxide-simeth, bisacodyl, diphenhydrAMINE, guaiFENesin-dextromethorphan, methocarbamol, prochlorperazine **OR** prochlorperazine **OR** prochlorperazine, sodium phosphate, temazepam   Vital Signs    Vitals:   12/18/18 1351 12/18/18 1353 12/18/18 1924 12/19/18 0434  BP: (!) 122/41 (!) 117/41 (!) 141/33 (!) 139/44  Pulse: (!) 58 (!) 59 85 80  Resp: _0 Temp: 98.6 F (37 C)  98.6 F (37 C) 98 F (36.7 C)  TempSrc: Oral  Oral Oral  SpO2: 99%     Weight:      Height:        Intake/Output Summary (Last 24 hours) at 12/19/2018 1103 Last data filed at 12/19/2018 0950 Gross per 24 hour  Intake 582 ml  Output -  Net 582 ml   Filed Weights   12/16/18 0338 12/17/18 0500 12/18/18 0451  Weight: 48.7 kg 47.8 kg 47.8 kg    Physical Exam   GEN: chronically ill appearing. Sitting up in bed HEENT: Grossly normal.  Neck:  Supple, no JVD, carotid bruits, or masses. Cardiac: RRR, spft systolic murmur. No diastolic murmur. no rubs, or gallops. No clubbing, cyanosis, edema.   Respiratory:  Respirations regular and unlabored, clear to auscultation bilaterally. GI: Soft, nontender, nondistended, BS + x 4. MS: no deformity or atrophy. Skin: warm and dry, no rash. Neuro:  Right arm and leg paresis. Can move thumb on right  Psych: AAOx3.  Normal affect.  Labs    CBC Recent Labs    12/18/18 0516 12/19/18 0610  WBC 2.7* 2.8*  HGB 7.0* 7.4*  HCT 21.5* 22.2*  MCV 93.9 92.5  PLT 48* 53*   Basic Metabolic Panel Recent Labs    12/18/18 0516  NA 139  K 3.9  CL 109  CO2 23  GLUCOSE 92  BUN 31*  CREATININE 1.55*  CALCIUM 8.6*   Liver Function Tests No results for input(s): AST, ALT, ALKPHOS, BILITOT, PROT, ALBUMIN in the last 72 hours. No results for input(s): LIPASE, AMYLASE in the last 72 hours. Cardiac Enzymes No results for input(s): CKTOTAL, CKMB, CKMBINDEX, TROPONINI in the last 72 hours. BNP Invalid input(s): POCBNP D-Dimer No results for input(s): DDIMER in the last 72 hours. Hemoglobin A1C No results for input(s): HGBA1C in the last 72 hours. Fasting Lipid Panel No  results for input(s): CHOL, HDL, LDLCALC, TRIG, CHOLHDL, LDLDIRECT in the last 72 hours. Thyroid Function Tests No results for input(s): TSH, T4TOTAL, T3FREE, THYROIDAB in the last 72 hours.  Invalid input(s): FREET3  Telemetry    None   ECG    Sinus with LBBB and frequent Ventricular paced beats- Personally Reviewed  Radiology    No results found.  Cardiac Studies   TAVR OPERATIVE NOTE   Date of Procedure:12/02/2018  Preoperative Diagnosis:Severe Aortic Stenosis   Postoperative Diagnosis:Same   Procedure:   Transcatheter Aortic Valve Replacement - PercutaneousRightTransfemoral Approach Medtronic Evolut Pro +(size76m, serial # [id number]   Co-Surgeons:Bryan KAlveria Apley MD and CLauree Chandler MD   Anesthesiologist:R. ERoanna Banning MD  EDala Dock MD  Pre-operative Echo Findings: ? Severe aortic stenosis ? Normal left ventricular systolic function  Post-operative Echo Findings: ? Trivialparavalvular leak ? Normal left ventricular systolic function   _____________    Echo 12/03/18 IMPRESSIONS 1. - TAVR: A 23 mm Medtronic Evolut Pro prosthetic valve is present in the aortic position. There is a mild degree of likely paravalvular leak detected by color doppler in the 5 to 7 o'clock position. The V max is 1.7 m/s. The peak/mean gradients are  12/6 mmHG. 2. The left ventricle has normal systolic function with an ejection fraction of 60-65%. The cavity size was normal. There is mildly increased left ventricular wall thickness. indeterminate diastolic dysfunction due to moderate MAC. No evidence of left  ventricular regional wall motion abnormalities. 3. The right ventricle has normal systolic function. The cavity was normal. There is no increase in right ventricular wall thickness. 4. Left atrial size was mildly dilated. 5. Right atrial size was mildly dilated. 6. The mitral valve is degenerative. Moderate thickening of the mitral valve leaflet. Moderate calcification of the mitral valve leaflet. There is moderate mitral annular calcification present. No evidence of mitral valve stenosis. 7. The tricuspid valve is grossly normal. 8. The aorta is normal unless otherwise noted. 9. The aortic root is normal in size and structure. 10. When compared to the prior study: S/p TAVR. Normal functioning valve.   _____________   PACEMAKER IMPLANTATION 12/09/18 PREPROCEDURE DIAGNOSIS: 1. Transient complete heart block  POSTPROCEDURE DIAGNOSIS:  1. Transient complete heart block  PROCEDURES:  1.  Leadless Pacemaker implantation.    INTRODUCTION:  Stacy Haydonis a 83y.o. female with a history of symptomatic transient complete heart block who presents today for pacemaker implantation.     DESCRIPTION OF PROCEDURE: Informed written consent was obtained, and the patient was brought to the electrophysiology lab in a fasting state. The patient received sedation as outlined in the procedure log. Using a percutaneous Seldinger technique, an 8-French was placed into the right common femoral vein.  A quick access wire was also placed within the left common femoral vein.  The 8 french sheath was exchanged for an 27 french sheath. A Medtronic Micra AV model [id number] (SN [id number]) Leadless Cardiac Pacemaker was advanced through the right femoral vein into the right ventricular apical septal position and fixed to the myocardial wall.  In this location, R waves measured 12 mV with an impedance of  870 Ohms and a threshold of 0.5 V_0 .280mc. Stability of the device was demonstrated with a "tug test". The device was disconnected from the implantation apparatus which was then removed from the body and the sheaths were aspirated and flushed. The sheaths were removed and hemostasis was assured. EBL<1077m There were no early apparent complications.   Permanent Pacemaker  Indication: Documented non-reversible symptomatic bradycardia due to second degree and/or third degree atrioventricular block.  During this procedure the patient is administered a total of Versed 1 mg and Fentanyl 12.5 mg to achieve and maintain moderate conscious sedation.  The patient's heart rate, blood pressure, and oxygen saturation are monitored continuously during the procedure. The period of conscious sedation is 32 minutes, of which I was present face-to-face 100% of this time.   CONCLUSIONS:  1.Successful implantation of a Medtronic Micra leadless pacemaker.  2. No early apparent complications.    Patient Profile     Stacy Cook is a 83 y.o. female  with a history of gastric cancer s/p partial gastrectomy in 2018, lung cancer s/p right lower lobectomy in 2017 with recurrence and malignant pleural effusion,pancytopenia, CKD stage III,carotid artery disease, diabetes mellitus,HLD, HTN,multivessel CAD s/p PCI and severe aortic stenosiss/p TAVR (4/94/49) complicated by periprocedural CVA and complete heart block requiring PPM (12/09/18) who is currently in inpatient rehab for therapy to recover from CVA. Cardiology is asked to comment on wide pulse pressure.   Assessment & Plan    Wide pulse pressure: this has been chronic for her and noted in the outpatient setting; however, DBPs have been running lower since TAVR. She is noted to have DBPs ranging from 20 to 50 mm Hg. Post op echo showed only a mild degree of likely paravalvular leak detected by color doppler in the 5 to 7 o'clock position. Likely 2/2 age related increased stiffness from aorta and other large arteries. She does not have any symptoms of hypotension. Plan to treat SBP. Currently on imdur 51m daily and Toprol XL 575mdaily and SBPs are in an acceptable range. She was previously on Losartan 10063maily. If SBP increases further this could be added back at a lower dose.  CHB: now s/p leadless PPM by Dr. AllRayann Heman 12/09/18.  Severe AS s/p TAVR: she has 1 month follow up and echo arranged for 10/8 in the outpatient setting.   Pancytopenia with worsening anemia: related to chemo. Rehab PA plans to start her on iron infusions and arancept.    Acute CVA: with persistent hemiparesis. Has had some improvement in mobility with therapy. Plan for DC from inpatient rehab on 9/19.  Signed, KAngelena FormA-C  12/19/2018, 11:03 AM  Pager [phone] seen, examined. Available data reviewed. Agree with findings, assessment, and plan as outlined by KatNell RangeA-C.  On my exam, the patient is an alert, oriented, elderly woman in no distress.  JVP is normal, lung fields are clear,  heart is regular rate and rhythm with a grade 2/6 systolic murmur at the right upper sternal border, there is no diastolic murmur, abdomen is soft, thin, and nontender.  Legs have mild edema in the pretibial region with chronic stasis dermatitis.  There is marked right arm weakness.  The patient appears clinically stable from a cardiac perspective.  She does have a wide pulse pressure and I agree with the findings and recommendations outlined above.  Her systolic blood pressure has been in a good range on her current treatment.  I would not reinitiate losartan at this point as I suspect it would have a high likelihood of causing hypotension and/or acute kidney injury.  Otherwise I think the patient is making reasonably good progress and I did not recommend any changes in her medical program today.  Please let us [place] if she has any other cardiac-related issues and we would be happy to see  her.  [name], M.D. 12/19/2018 6:19 PM

## 2018-12-19 NOTE — Progress Notes (Signed)
Occupational Therapy Session Note  Patient Details  Name: Stacy Cook MRN: [id number] Date of Birth: 02-16-1936  Today's Date: 12/19/2018 OT Individual Time: 1100-1158 OT Individual Time Calculation (min): 58 min    Short Term Goals: Week 1:  OT Short Term Goal 1 (Week 1): Pt will complete UB dressing to donn a pullover shirt with min assist following hemidressing techniques. OT Short Term Goal 1 - Progress (Week 1): Met OT Short Term Goal 2 (Week 1): Pt will complete bathing sit to stand with min assist. OT Short Term Goal 2 - Progress (Week 1): Met OT Short Term Goal 3 (Week 1): Pt will peform toilet transfer with min assist stand pivot OT Short Term Goal 3 - Progress (Week 1): Met OT Short Term Goal 4 (Week 1): Pt will use the RUE as a gross assist with self care and grooming tasks with mod facilitation. OT Short Term Goal 4 - Progress (Week 1): Met OT Short Term Goal 5 (Week 1): Pt will complete LB dressing with mod assist for all aspects sit to stand. OT Short Term Goal 5 - Progress (Week 1): Met  Skilled Therapeutic Interventions/Progress Updates:    Patient in bed, states that she is feeling tired but willing to participate in therapy session.  Bed mobility with CGA/min A, SPT to/from bed, mat, w/c, toilet with min A.  Tolerated unsupported sitting edge of mat for 30 minutes during UB motor activities with good seated posture and balance.  Right UE AAROM shoulder/scapula to hand.  Completed UB motor control activities in supine position on mat.  Patient with mild fatigue and c/o dizziness with change of position that resolves within one minute.  toileting with min A. Returned to bed at close of session - call bell in reach, bed alarm set.    Therapy Documentation Precautions:  Precautions Precautions: ICD/Pacemaker, Fall, Other (comment)(seizures) Precaution Comments: right hemiparesis Restrictions Weight Bearing Restrictions: No RUE Weight Bearing: Non weight bearing RLE  Weight Bearing: Weight bearing as tolerated LLE Weight Bearing: Weight bearing as tolerated General:   Vital Signs:  Pain: Pain Assessment Pain Scale: 0-10 Pain Score: 0-No pain   Other Treatments:     Therapy/Group: Individual Therapy  Carlos Levering 12/19/2018, 12:25 PM

## 2018-12-19 NOTE — Progress Notes (Signed)
Occupational Therapy Weekly Progress Note  Patient Details  Name: Stacy Cook MRN: [id number] Date of Birth: [birth year]  Beginning of progress report period: December 13, 2018 End of progress report period: December 19, 2018  Today's Date: 12/19/2018 OT Individual Time: 3335-4562 OT Individual Time Calculation (min): 70 min    Patient has met 5 of 5 short term goals.  Pt is making steady progress towards OT goals. She is at an overall min A level with BADL tasks. She has more return in R UE, and is working on using R UE as a stabilizer within functional tasks. Continue current POC.  Patient continues to demonstrate the following deficits: muscle weakness, decreased cardiorespiratoy endurance and decreased sitting balance, decreased standing balance, decreased postural control, hemiplegia and decreased balance strategies and therefore will continue to benefit from skilled OT intervention to enhance overall performance with BADL and functional use of R UE.  Patient progressing toward long term goals..  Continue plan of care.  OT Short Term Goals Week 1:  OT Short Term Goal 1 (Week 1): Pt will complete UB dressing to donn a pullover shirt with min assist following hemidressing techniques. OT Short Term Goal 1 - Progress (Week 1): Met OT Short Term Goal 2 (Week 1): Pt will complete bathing sit to stand with min assist. OT Short Term Goal 2 - Progress (Week 1): Met OT Short Term Goal 3 (Week 1): Pt will peform toilet transfer with min assist stand pivot OT Short Term Goal 3 - Progress (Week 1): Met OT Short Term Goal 4 (Week 1): Pt will use the RUE as a gross assist with self care and grooming tasks with mod facilitation. OT Short Term Goal 4 - Progress (Week 1): Met OT Short Term Goal 5 (Week 1): Pt will complete LB dressing with mod assist for all aspects sit to stand. OT Short Term Goal 5 - Progress (Week 1): Met Week 2:  OT Short Term Goal 1 (Week 2): LTG=STG 2/2 ELOS  Skilled  Therapeutic Interventions/Progress Updates:    Pt greeted semi-reclined in bed and agreeable to OT treatment session despite feeling very rundown and tired. Pt declined to bathe or dress today stating she was too tired and cold. Pt complaining about going to the bathroom too much with loose BMs. Pt stated she need to go to the bathroom again. Pt ambulated into bathroom w/ RW, R UE in hand splint and min A. Pt able to manage clothing and complete peri-care with min/CGA for balance. PT took seated rest break at EOB, then stand-pivot to wc with CGA. Pt brought outside in wc and OT educated on backing wc in and out of elevator. R UE NMR while sitting outside in wc. Joint input through elbow and wrist to bring pt through full ROM + increased shoulder activation. Pt returned to unit and continued working on R NMR with chup stacking task. Pt needed OT assist to open grasp around cup. Then pt able to slide cup over to the L hand-OT assist to lift cup overtop of the other. Pt returned to room and completed stan-pivot back to bed w/ CGA. Pt left with son present, bed alarm on, and needs met.   Therapy Documentation Precautions:  Precautions Precautions: ICD/Pacemaker, Fall, Other (comment)(seizures) Precaution Comments: right hemiparesis Restrictions Weight Bearing Restrictions: No RUE Weight Bearing: Non weight bearing RLE Weight Bearing: Weight bearing as tolerated LLE Weight Bearing: Weight bearing as tolerated Pain: Pain Assessment Pain Scale: 0-10 Pain Score: 0-No pain  Therapy/Group: Individual Therapy  Valma Cava 12/19/2018, 11:51 AM

## 2018-12-19 NOTE — Progress Notes (Addendum)
[facility] PHYSICAL MEDICINE & REHABILITATION PROGRESS NOTE   Subjective/Complaints: Had a better night. Stools more formed over last 24 hours. Still feels fatigued.   ROS: Patient denies fever, rash, sore throat, blurred vision, nausea, vomiting, diarrhea, cough, shortness of breath or chest pain, joint or back pain, headache, or mood change.    Objective:   No results found. Recent Labs    12/18/18 0516 12/19/18 0610  WBC 2.7* 2.8*  HGB 7.0* 7.4*  HCT 21.5* 22.2*  PLT 48* 53*   Recent Labs    12/18/18 0516  NA 139  K 3.9  CL 109  CO2 23  GLUCOSE 92  BUN 31*  CREATININE 1.55*  CALCIUM 8.6*    Intake/Output Summary (Last 24 hours) at 12/19/2018 1035 Last data filed at 12/19/2018 0950 Gross per 24 hour  Intake 582 ml  Output -  Net 582 ml     Physical Exam: Vital Signs Blood pressure (!) 139/44, pulse 80, temperature 98 F (36.7 C), temperature source Oral, resp. rate 18, height 5\' 1"  (1.549 m), weight 47.8 kg, SpO2 99 %.  Constitutional: No distress . Vital signs reviewed. HEENT: EOMI, oral membranes moist Neck: supple Cardiovascular: RRR without murmur. No JVD    Respiratory: CTA Bilaterally without wheezes or rales. Normal effort    GI: BS +, non-tender, non-distended  Musculoskeletal:Normal range of motion.  General: No deformityor edema.  Neurological: She is alertand oriented to person, place, and time. Right facial weakness with mild dysarthria. RUE   1+/5 biceps, delt, triceps wrist and finger flexion. RLE  2/5 except for ADF which remains tr to absent. Senses pain and light touch in all 4. Fair insight and awareness. Normal language. Good insight and awareness. Skin: Skin iswarmand dry. She isnot diaphoretic.  scattered bruisingface/limbs all resolving Psychiatric: in good spirits.      Assessment/Plan: 1. 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  Physiatrist is providing close team supervision and 24 hour management of active medical problems listed below.  Physiatrist and rehab team continue to assess barriers to discharge/monitor patient progress toward functional and medical goals  Care Tool:  Bathing  Bathing activity did not occur: Refused Body parts bathed by patient: Right arm, Chest, Abdomen, Right upper leg, Left upper leg, Face, Front perineal area, Buttocks   Body parts bathed by helper: Left arm, Right lower leg, Left lower leg     Bathing assist Assist Level: Minimal Assistance - Patient > 75%     Upper Body Dressing/Undressing Upper body dressing Upper body dressing/undressing activity did not occur (including orthotics): Safety/medical concerns What is the patient wearing?: Pull over shirt    Upper body assist Assist Level: Minimal Assistance - Patient > 75%    Lower Body Dressing/Undressing Lower body dressing      What is the patient wearing?: Underwear/pull up, Pants     Lower body assist Assist for lower body dressing: Minimal Assistance - Patient > 75%     Toileting Toileting Toileting Activity did not occur (Clothing management and hygiene only): N/A (no void or bm)  Toileting assist Assist for toileting: Minimal Assistance - Patient > 75%     Transfers Chair/bed transfer  Transfers assist     Chair/bed transfer assist level: Contact Guard/Touching assist     Locomotion Ambulation   Ambulation assist   Ambulation activity  did not occur: Safety/medical concerns  Assist level: Contact Guard/Touching assist Assistive device: Walker-rolling Max distance: 100'   Walk 10 feet activity   Assist  Walk 10 feet activity did not occur: Safety/medical concerns  Assist level: Contact Guard/Touching assist Assistive device: Walker-rolling   Walk 50 feet activity   Assist Walk 50 feet with 2  turns activity did not occur: Safety/medical concerns  Assist level: Contact Guard/Touching assist Assistive device: Walker-rolling    Walk 150 feet activity   Assist Walk 150 feet activity did not occur: Safety/medical concerns  Assist level: Contact Guard/Touching assist Assistive device: Walker-rolling    Walk 10 feet on uneven surface  activity   Assist Walk 10 feet on uneven surfaces activity did not occur: Safety/medical concerns         Wheelchair     Assist Will patient use wheelchair at discharge?: Yes Type of Wheelchair: Manual    Wheelchair assist level: Supervision/Verbal cueing Max wheelchair distance: 150    Wheelchair 50 feet with 2 turns activity    Assist        Assist Level: Supervision/Verbal cueing   Wheelchair 150 feet activity     Assist  Wheelchair 150 feet activity did not occur: Safety/medical concerns   Assist Level: Supervision/Verbal cueing   Blood pressure (!) 139/44, pulse 80, temperature 98 F (36.7 C), temperature source Oral, resp. rate 18, height 5\' 1"  (1.549 m), weight 47.8 kg, SpO2 99 %.   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 Pt required transfer to acute d/t seizures and heart block --Continue CIR therapies including PT, OT, and SLP as tolerated  -fatigue likely multifactorial given sz/medication, anemia, AVR/HB/cardiac considerations  -ELOS 9/19 2. Antithrombotics: -DVT/anticoagulation:Mechanical:Sequential compression devices, below kneeBilateral lower extremitiesdue to low platelets. -antiplatelet therapy: DAPTwith ASA and plavix 3. Pain Management:tylenol prn. 4. Mood:LCSW to follow for evaluation and support. -antipsychotic agents: N/A 5. Neuropsych: This patientiscapable of making decisions on herown behalf. 6. Skin/Wound Care:Monitor groin  incisions for healing. Monitor right groin hematoma. 7. Fluids/Electrolytes/Nutrition:Monitor I/O.encourage PO 8. TAVR/CHB s/p PPM: On ASA/Plavix due to recent TAVR.   -metoprolol still on hold 9. New onset focal motor seizures: Continue Keppra bid X 3 months. Follow up with Dr. Felecia Shelling after discharge.  11. Loose stools:  Most likely cause is her tagrisso  -off senna   -stopped Fe+ supp  -continue probiotic and fiber  -some improvement in stool consistency the last 24 hr 12. Acute on chronic anemia: no signs of bleeding.   H/H trend has been around 7-8 range overall during stay.     9/7- Hb 8.0--->9/10 7.0-->7.4 9/11  -I transfused her about 2 weeks ago, and she's back in the 7's.  -think she might do better with regular aranesp to help boost production.   -still can consider transfusion esp if hgb drops below 7.0  -monitor counts daily, anemia work up 13. Acute on chronic renal failure (CKD III): Hyperkalemia resolved--  -ReportsSCr1.9 one month PTA.  9/10- Cr up slightly to 1.55 but BUN holding at 31  -recheck next week 14. Chronic diastolic CHF: Monitor for signs of overload.   Continue Imdur, ASA and   Filed Weights   12/16/18 0338 12/17/18 0500 12/18/18 0451  Weight: 48.7 kg 47.8 kg 47.8 kg    -weights have been generally stable 15. Metastatic lung cancer/chronic right pleural effusion: On Tagrisso. Effusion stable.  16. Acute on chronic thrombocytopenia: Baseline around 50-->38.  9/11- Plts 53k - stable 17. Neutropenia: Has been in 2-3 range--monitor ANC for now.  9/11- WBC   2.8 18. Muscle cramps/spasms- 9/5- has Zanaflex 2 mg q8 hours prn-helpful but it may be exacerbating fluctations in bp  Stopped tizanidine d/t low bp's 19. HTN- bp 141/32--  -DBP has been running quite low, wide pulse pressures  -continue with current meds for now  -we have contacted cardiology for further recs  -have not resumed metoprolol at this time d/t soft bp's but may need to help with  CO.        LOS: 7 days A FACE TO FACE EVALUATION WAS PERFORMED  Meredith Staggers 12/19/2018, 10:35 AM

## 2018-12-20 ENCOUNTER — Inpatient Hospital Stay (HOSPITAL_COMMUNITY): Payer: Medicare Other

## 2018-12-20 LAB — GLUCOSE, CAPILLARY
Glucose-Capillary: 79 mg/dL (ref 70–99)
Glucose-Capillary: 69 mg/dL — ABNORMAL LOW (ref 70–99)
Glucose-Capillary: 94 mg/dL (ref 70–99)
Glucose-Capillary: 100 mg/dL — ABNORMAL HIGH (ref 70–99)
Glucose-Capillary: 92 mg/dL (ref 70–99)

## 2018-12-20 NOTE — Progress Notes (Signed)
[facility] PHYSICAL MEDICINE & REHABILITATION PROGRESS NOTE   Subjective/Complaints: A little tired in therapy last 2 days , has not had PT/OT this am.  Occ dizzy when going from sup to sit   ROS: Patient denies fever, rash, sore throat, blurred vision, nausea, vomiting, diarrhea, cough, shortness of breath or chest pain, joint or back pain, headache, or mood change.    Objective:   No results found. Recent Labs    12/18/18 0516 12/19/18 0610  WBC 2.7* 2.8*  HGB 7.0* 7.4*  HCT 21.5* 22.2*  PLT 48* 53*   Recent Labs    12/18/18 0516  NA 139  K 3.9  CL 109  CO2 23  GLUCOSE 92  BUN 31*  CREATININE 1.55*  CALCIUM 8.6*    Intake/Output Summary (Last 24 hours) at 12/20/2018 1136 Last data filed at 12/20/2018 0900 Gross per 24 hour  Intake 240 ml  Output -  Net 240 ml     Physical Exam: Vital Signs Blood pressure (!) 144/42, pulse (!) 57, temperature 98.6 F (37 C), resp. rate 17, height 5\' 1"  (1.549 m), weight 50.1 kg, SpO2 100 %.  Constitutional: No distress . Vital signs reviewed. HEENT: EOMI, oral membranes moist Neck: supple Cardiovascular: RRR without murmur. No JVD    Respiratory: CTA Bilaterally without wheezes or rales. Normal effort    GI: BS +, non-tender, non-distended  Musculoskeletal:Normal range of motion.  General: No deformityor edema.  Neurological: She is alertand oriented to person, place, and time. Right facial weakness with mild dysarthria. RUE   1+/5 biceps, delt, triceps wrist and finger flexion. RLE  2/5 except for ADF which remains tr to absent. Senses pain and light touch in all 4. Fair insight and awareness. Normal language. Good insight and awareness. Skin: Skin iswarmand dry. She isnot diaphoretic.  scattered bruisingface/limbs all resolving Psychiatric: in good spirits.      Assessment/Plan: 1. 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  Physiatrist is providing close team supervision and 24 hour management of active medical problems listed below.  Physiatrist and rehab team continue to assess barriers to discharge/monitor patient progress toward functional and medical goals  Care Tool:  Bathing  Bathing activity did not occur: Refused Body parts bathed by patient: Right arm, Chest, Abdomen, Right upper leg, Left upper leg, Face, Front perineal area, Buttocks   Body parts bathed by helper: Left arm, Right lower leg, Left lower leg     Bathing assist Assist Level: Minimal Assistance - Patient > 75%     Upper Body Dressing/Undressing Upper body dressing Upper body dressing/undressing activity did not occur (including orthotics): Safety/medical concerns What is the patient wearing?: Pull over shirt    Upper body assist Assist Level: Minimal Assistance - Patient > 75%    Lower Body Dressing/Undressing Lower body dressing      What is the patient wearing?: Underwear/pull up, Pants     Lower body assist Assist for lower body dressing: Minimal Assistance - Patient > 75%     Toileting Toileting Toileting Activity did not occur (Clothing management and hygiene only): N/A (no void or bm)  Toileting assist Assist for toileting: Minimal Assistance - Patient > 75%     Transfers Chair/bed transfer  Transfers assist     Chair/bed transfer assist level: Contact Guard/Touching assist     Locomotion Ambulation  Ambulation assist   Ambulation activity did not occur: Safety/medical concerns  Assist level: Contact Guard/Touching assist Assistive device: Walker-rolling Max distance: 100'   Walk 10 feet activity   Assist  Walk 10 feet activity did not occur: Safety/medical concerns  Assist level: Contact Guard/Touching assist Assistive device: Walker-rolling   Walk 50 feet activity   Assist Walk 50  feet with 2 turns activity did not occur: Safety/medical concerns  Assist level: Contact Guard/Touching assist Assistive device: Walker-rolling    Walk 150 feet activity   Assist Walk 150 feet activity did not occur: Safety/medical concerns  Assist level: Contact Guard/Touching assist Assistive device: Walker-rolling    Walk 10 feet on uneven surface  activity   Assist Walk 10 feet on uneven surfaces activity did not occur: Safety/medical concerns         Wheelchair     Assist Will patient use wheelchair at discharge?: Yes Type of Wheelchair: Manual    Wheelchair assist level: Supervision/Verbal cueing Max wheelchair distance: 150    Wheelchair 50 feet with 2 turns activity    Assist        Assist Level: Supervision/Verbal cueing   Wheelchair 150 feet activity     Assist  Wheelchair 150 feet activity did not occur: Safety/medical concerns   Assist Level: Supervision/Verbal cueing   Blood pressure (!) 144/42, pulse (!) 57, temperature 98.6 F (37 C), resp. rate 17, height 5\' 1"  (1.549 m), weight 50.1 kg, SpO2 100 %.   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 Pt required transfer to acute d/t seizures and heart block --Continue CIR therapies including PT, OT, and SLP as tolerated  -fatigue likely multifactorial given sz/medication, anemia, AVR/HB/cardiac considerations  -ELOS 9/19 2. Antithrombotics: -DVT/anticoagulation:Mechanical:Sequential compression devices, below kneeBilateral lower extremitiesdue to low platelets. -antiplatelet therapy: DAPTwith ASA and plavix 3. Pain Management:tylenol prn. 4. Mood:LCSW to follow for evaluation and support. -antipsychotic agents: N/A 5. Neuropsych: This patientiscapable of making decisions on herown behalf. 6. Skin/Wound Care:Monitor groin incisions  for healing. Monitor right groin hematoma. 7. Fluids/Electrolytes/Nutrition:Monitor I/O.encourage PO 8. TAVR/CHB s/p PPM: On ASA/Plavix due to recent TAVR.   -metoprolol still on hold 9. New onset focal motor seizures: Continue Keppra bid X 3 months. Follow up with Dr. Felecia Shelling after discharge.  11. Loose stools:  Most likely cause is her tagrisso  -off senna   -stopped Fe+ supp  -continue probiotic and fiber  -some improvement in stool consistency the last 24 hr 12. Acute on chronic anemia: no signs of bleeding.   H/H trend has been around 7-8 range overall during stay.     9/7- Hb 8.0--->9/10 7.0-->7.4 9/11  -I transfused her about 2 weeks ago, and she's back in the 7's.  -think she might do better with regular aranesp to help boost production.   -still can consider transfusion esp if hgb drops below 7.0  CHeck orthostatic BPs 13. Acute on chronic renal failure (CKD III): Hyperkalemia resolved--  -ReportsSCr1.9 one month PTA.  9/10- Cr up slightly to 1.55 but BUN holding at 31  -recheck next week 14. Chronic diastolic CHF: Monitor for signs of overload.   Continue Imdur, ASA and   Filed Weights   12/17/18 0500 12/18/18 0451 12/20/18 0536  Weight: 47.8 kg 47.8 kg 50.1 kg    -weights have been generally stable 15. Metastatic lung cancer/chronic right pleural effusion: On Tagrisso. Effusion stable.  16. Acute on chronic thrombocytopenia: Baseline around  50-->38.   9/11- Plts 53k - stable 17. Neutropenia: Has been in 2-3 range--monitor ANC for now.  9/11- WBC   2.8 18. Muscle cramps/spasms- 9/5- has Zanaflex 2 mg q8 hours prn-helpful but it may be exacerbating fluctations in bp  Stopped tizanidine d/t low bp's 19. HTN- bp 141/32--  -DBP has been running quite low, wide pulse pressures  -continue with current meds for now  -we have contacted cardiology for further recs  -have not resumed metoprolol at this time d/t soft bp's but may need to help with CO.        LOS: 8  days A FACE TO FACE EVALUATION WAS PERFORMED  Stacy Cook 12/20/2018, 11:36 AM

## 2018-12-20 NOTE — Progress Notes (Signed)
Occupational Therapy Session Note  Patient Details  Name: Stacy Cook MRN: [id number] Date of Birth: [birth year]  Today's Date: 12/20/2018 OT Individual Time: 1505-1605 OT Individual Time Calculation (min): 60 min    Short Term Goals: Week 1:  OT Short Term Goal 1 (Week 1): Pt will complete UB dressing to donn a pullover shirt with min assist following hemidressing techniques. OT Short Term Goal 1 - Progress (Week 1): Met OT Short Term Goal 2 (Week 1): Pt will complete bathing sit to stand with min assist. OT Short Term Goal 2 - Progress (Week 1): Met OT Short Term Goal 3 (Week 1): Pt will peform toilet transfer with min assist stand pivot OT Short Term Goal 3 - Progress (Week 1): Met OT Short Term Goal 4 (Week 1): Pt will use the RUE as a gross assist with self care and grooming tasks with mod facilitation. OT Short Term Goal 4 - Progress (Week 1): Met OT Short Term Goal 5 (Week 1): Pt will complete LB dressing with mod assist for all aspects sit to stand. OT Short Term Goal 5 - Progress (Week 1): Met  Skilled Therapeutic Interventions/Progress Updates:    1:1. Pt received in bed with son present. Pt agreeable to OT with no pain reported. Pt toilets with S-CGA for standing balance during CM. Pt propels w/c to dining courtyard with hemi technique and 1 rest break each way for community mobility distance training. Pt completes funtional mobility with RW and AFO to booth, around plants and back to w/c in prep for community outings after d/c. Edu re choosing seats with arm rests to push up off of with LUE for safety. Pt completes supine/Sidelying NMR with proprioceptive input at wrist and elbow with min manual resistance in the following exercises: shoulder flex/ext, elbow flex/ext, int/ext rotation, ab/adduct. Exited session with pt seated in bed exit alarm on and call light in reach  Therapy Documentation Precautions:  Precautions Precautions: ICD/Pacemaker, Fall, Other  (comment)(seizures) Precaution Comments: right hemiparesis Restrictions Weight Bearing Restrictions: No RUE Weight Bearing: Non weight bearing RLE Weight Bearing: Weight bearing as tolerated LLE Weight Bearing: Weight bearing as tolerated General:   Vital Signs: Therapy Vitals Temp: 97.6 F (36.4 C) Pulse Rate: 64 Resp: 16 BP: 134/65 Patient Position (if appropriate): Lying Oxygen Therapy SpO2: 100 % O2 Device: Room Air Pain:   ADL: ADL Grooming: Supervision/safety Where Assessed-Grooming: Wheelchair, Sitting at sink Where Assessed-Upper Body Dressing: Wheelchair Lower Body Dressing: Maximal assistance Where Assessed-Lower Body Dressing: Wheelchair, Standing at sink, Sitting at [employer] Transfer: Minimal assistance Vision   Perception    Praxis   Exercises:   Other Treatments:     Therapy/Group: Individual Therapy  Tonny Branch 12/20/2018, 4:08 PM

## 2018-12-21 LAB — GLUCOSE, CAPILLARY: Glucose-Capillary: 85 mg/dL (ref 70–99)

## 2018-12-21 NOTE — Progress Notes (Signed)
[place] PHYSICAL MEDICINE & REHABILITATION PROGRESS NOTE   Subjective/Complaints:  Still very weak on right side   ROS: Patient denies fever, rash, sore throat, blurred vision, nausea, vomiting, diarrhea, cough, shortness of breath or chest pain, joint or back pain, headache, or mood change.    Objective:   No results found. Recent Labs    12/19/18 0610  WBC 2.8*  HGB 7.4*  HCT 22.2*  PLT 53*   No results for input(s): NA, K, CL, CO2, GLUCOSE, BUN, CREATININE, CALCIUM in the last 72 hours.  Intake/Output Summary (Last 24 hours) at 12/21/2018 1038 Last data filed at 12/21/2018 0812 Gross per 24 hour  Intake 960 ml  Output -  Net 960 ml     Physical Exam: Vital Signs Blood pressure (!) 164/51, pulse 63, temperature 98 F (36.7 C), temperature source Oral, resp. rate 16, height 5\' 1"  (1.549 m), weight 50.2 kg, SpO2 99 %.  Constitutional: No distress . Vital signs reviewed. HEENT: EOMI, oral membranes moist Neck: supple Cardiovascular: RRR without murmur. No JVD    Respiratory: CTA Bilaterally without wheezes or rales. Normal effort    GI: BS +, non-tender, non-distended  Musculoskeletal:Normal range of motion.  General: No deformityor edema.  Neurological: She is alertand oriented to person, place, and time. Right facial weakness with mild dysarthria. RUE   1+/5 biceps, delt, triceps wrist and finger flexion. RLE  2/5 except for ADF which remains tr to absent. Senses pain and light touch in all 4. Fair insight and awareness. Normal language. Good insight and awareness. Skin: Skin iswarmand dry. She isnot diaphoretic.  scattered bruisingface/limbs all resolving Psychiatric: in good spirits.      Assessment/Plan: 1. 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  Physiatrist is  providing close team supervision and 24 hour management of active medical problems listed below.  Physiatrist and rehab team continue to assess barriers to discharge/monitor patient progress toward functional and medical goals  Care Tool:  Bathing  Bathing activity did not occur: Refused Body parts bathed by patient: Right arm, Chest, Abdomen, Right upper leg, Left upper leg, Face, Front perineal area, Buttocks   Body parts bathed by helper: Left arm, Right lower leg, Left lower leg     Bathing assist Assist Level: Minimal Assistance - Patient > 75%     Upper Body Dressing/Undressing Upper body dressing Upper body dressing/undressing activity did not occur (including orthotics): Safety/medical concerns What is the patient wearing?: Pull over shirt    Upper body assist Assist Level: Minimal Assistance - Patient > 75%    Lower Body Dressing/Undressing Lower body dressing      What is the patient wearing?: Underwear/pull up, Pants     Lower body assist Assist for lower body dressing: Minimal Assistance - Patient > 75%     Toileting Toileting Toileting Activity did not occur (Clothing management and hygiene only): N/A (no void or bm)  Toileting assist Assist for toileting: Minimal Assistance - Patient > 75%     Transfers Chair/bed transfer  Transfers assist     Chair/bed transfer assist level: Contact Guard/Touching assist     Locomotion Ambulation   Ambulation assist   Ambulation activity did not occur: Safety/medical concerns  Assist level: Contact Guard/Touching assist Assistive device: Walker-rolling Max distance: 100'   Walk 10 feet activity   Assist  Walk 10  feet activity did not occur: Safety/medical concerns  Assist level: Contact Guard/Touching assist Assistive device: Walker-rolling   Walk 50 feet activity   Assist Walk 50 feet with 2 turns activity did not occur: Safety/medical concerns  Assist level: Contact Guard/Touching  assist Assistive device: Walker-rolling    Walk 150 feet activity   Assist Walk 150 feet activity did not occur: Safety/medical concerns  Assist level: Contact Guard/Touching assist Assistive device: Walker-rolling    Walk 10 feet on uneven surface  activity   Assist Walk 10 feet on uneven surfaces activity did not occur: Safety/medical concerns         Wheelchair     Assist Will patient use wheelchair at discharge?: Yes Type of Wheelchair: Manual    Wheelchair assist level: Supervision/Verbal cueing Max wheelchair distance: 150    Wheelchair 50 feet with 2 turns activity    Assist        Assist Level: Supervision/Verbal cueing   Wheelchair 150 feet activity     Assist  Wheelchair 150 feet activity did not occur: Safety/medical concerns   Assist Level: Supervision/Verbal cueing   Blood pressure (!) 164/51, pulse 63, temperature 98 F (36.7 C), temperature source Oral, resp. rate 16, height 5\' 1"  (1.549 m), weight 50.2 kg, SpO2 99 %.   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 Pt required transfer to acute d/t seizures and heart block --Continue CIR therapies including PT, OT, and SLP as tolerated  -fatigue likely multifactorial given sz/medication, anemia, AVR/HB/cardiac considerations  -ELOS 9/19 2. Antithrombotics: -DVT/anticoagulation:Mechanical:Sequential compression devices, below kneeBilateral lower extremitiesdue to low platelets. -antiplatelet therapy: DAPTwith ASA and plavix 3. Pain Management:tylenol prn. 4. Mood:LCSW to follow for evaluation and support. -antipsychotic agents: N/A 5. Neuropsych: This patientiscapable of making decisions on herown behalf. 6. Skin/Wound Care:Monitor groin incisions for healing. Monitor right groin hematoma. 7. Fluids/Electrolytes/Nutrition:Monitor  I/O.encourage PO 8. TAVR/CHB s/p PPM: On ASA/Plavix due to recent TAVR.   -metoprolol still on hold 9. New onset focal motor seizures: Continue Keppra bid X 3 months. Follow up with Dr. Felecia Shelling after discharge.  11. Loose stools:  Most likely cause is her tagrisso  -off senna   -stopped Fe+ supp  -continue probiotic and fiber  -some improvement in stool consistency the last 24 hr 12. Acute on chronic anemia: no signs of bleeding.   H/H trend has been around 7-8 range overall during stay.     9/7- Hb 8.0--->9/10 7.0-->7.4 9/11  -I transfused her about 2 weeks ago, and she's back in the 7's.  -think she might do better with regular aranesp to help boost production.   -still can consider transfusion esp if hgb drops below 7.0  CHeck orthostatic BPs 13. Acute on chronic renal failure (CKD III): Hyperkalemia resolved--  -ReportsSCr1.9 one month PTA.  9/10- Cr up slightly to 1.55 but BUN holding at 31  -recheck next week 14. Chronic diastolic CHF: Monitor for signs of overload.   Continue Imdur, ASA and   Filed Weights   12/18/18 0451 12/20/18 0536 12/21/18 0403  Weight: 47.8 kg 50.1 kg 50.2 kg    -weights have been generally stable 15. Metastatic lung cancer/chronic right pleural effusion: On Tagrisso discussed with RPh that QTc monitoring is reocommended weekly , have ordered EKG for tomorrow  . Effusion stable.  16. Acute on chronic thrombocytopenia: Baseline around 50-->38.   9/11- Plts 53k - stable 17. Neutropenia: Has been in 2-3 range--monitor ANC  for now.  9/11- WBC   2.8 18. Muscle cramps/spasms- 9/5- has Zanaflex 2 mg q8 hours prn-helpful but it may be exacerbating fluctations in bp  Stopped tizanidine d/t low bp's 19. HTN Vitals:   12/20/18 1915 12/21/18 0403  BP: (!) 123/41 (!) 164/51  Pulse: 60 63  Resp: 17 16  Temp: 98 F (36.7 C) 98 F (36.7 C)  SpO2: 99% 16%  elevated systolic this am , widened pulse pressure may be from valvular ht disease   -have not  resumed metoprolol at this time d/t soft bp's but may need to help with CO.         LOS: 9 days A FACE TO FACE EVALUATION WAS PERFORMED  Charlett Blake 12/21/2018, 10:38 AM

## 2018-12-22 ENCOUNTER — Inpatient Hospital Stay (HOSPITAL_COMMUNITY): Payer: Medicare Other | Admitting: Physical Therapy

## 2018-12-22 ENCOUNTER — Inpatient Hospital Stay (HOSPITAL_COMMUNITY): Payer: Medicare Other

## 2018-12-22 ENCOUNTER — Inpatient Hospital Stay (HOSPITAL_COMMUNITY): Payer: Medicare Other | Admitting: Occupational Therapy

## 2018-12-22 LAB — CBC WITH DIFFERENTIAL/PLATELET
Abs Immature Granulocytes: 0.01 10*3/uL (ref 0.00–0.07)
Basophils Absolute: 0 10*3/uL (ref 0.0–0.1)
Basophils Relative: 1 %
Eosinophils Absolute: 0.1 10*3/uL (ref 0.0–0.5)
Eosinophils Relative: 5 %
HCT: 26.4 % — ABNORMAL LOW (ref 36.0–46.0)
Hemoglobin: 8.8 g/dL — ABNORMAL LOW (ref 12.0–15.0)
Immature Granulocytes: 0 %
Lymphocytes Relative: 29 %
Lymphs Abs: 0.8 10*3/uL (ref 0.7–4.0)
MCH: 31.3 pg (ref 26.0–34.0)
MCHC: 33.3 g/dL (ref 30.0–36.0)
MCV: 94 fL (ref 80.0–100.0)
Monocytes Absolute: 0.2 10*3/uL (ref 0.1–1.0)
Monocytes Relative: 8 %
Neutro Abs: 1.5 10*3/uL — ABNORMAL LOW (ref 1.7–7.7)
Neutrophils Relative %: 57 %
Platelets: 57 10*3/uL — ABNORMAL LOW (ref 150–400)
RBC: 2.81 MIL/uL — ABNORMAL LOW (ref 3.87–5.11)
RDW: 14.4 % (ref 11.5–15.5)
WBC: 2.7 10*3/uL — ABNORMAL LOW (ref 4.0–10.5)
nRBC: 0 % (ref 0.0–0.2)

## 2018-12-22 LAB — BASIC METABOLIC PANEL
Anion gap: 7 (ref 5–15)
BUN: 31 mg/dL — ABNORMAL HIGH (ref 8–23)
CO2: 24 mmol/L (ref 22–32)
Calcium: 8.6 mg/dL — ABNORMAL LOW (ref 8.9–10.3)
Chloride: 109 mmol/L (ref 98–111)
Creatinine, Ser: 1.43 mg/dL — ABNORMAL HIGH (ref 0.44–1.00)
GFR calc Af Amer: 40 mL/min — ABNORMAL LOW (ref >60)
GFR calc non Af Amer: 35 mL/min — ABNORMAL LOW (ref >60)
Glucose, Bld: 117 mg/dL — ABNORMAL HIGH (ref 70–99)
Potassium: 3.8 mmol/L (ref 3.5–5.1)
Sodium: 140 mmol/L (ref 135–145)

## 2018-12-22 NOTE — Progress Notes (Signed)
Physical Therapy Session Note  Patient Details  Name: Stacy Cook MRN: [id number] Date of Birth: [birth year]  Today's Date: 12/22/2018 PT Individual Time: 5072-2575 PT Individual Time Calculation (min): 41 min   Short Term Goals: Week 2:  PT Short Term Goal 1 (Week 2): =LTGs due to ELOS  Skilled Therapeutic Interventions/Progress Updates:   Pt in w/c and agreeable to therapy, no c/o pain. Continues to report increased fatigue. Total assist w/c transport to/from therapy gym. Ambulated 150' w/ CGA using RW, RUE orthosis, and RAFO. Pt continues to have mild toe drag 2/2 decreased R knee flexion during swing. Verbal and visual cues to increase R knee flexion. NuStep 5 min @ level 1 w/ BLEs only to work on RLE posterior chain muscle activation. Supine RLE NMR including single leg bridge 3x10, tactile cues for hamstring/glut activation. Seated R heel slide 3x10. Discussed performing these exercises outside of therapy, pt verbalized understanding. Ongoing encouragement to eat and drink more for energy and education regarding appetite effects of chemotherapy. Returned to room via w/c and ended session in w/c, all needs in reach.   Therapy Documentation Precautions:  Precautions Precautions: ICD/Pacemaker, Fall, Other (comment)(seizures) Precaution Comments: right hemiparesis Restrictions Weight Bearing Restrictions: No RUE Weight Bearing: Non weight bearing RLE Weight Bearing: Weight bearing as tolerated LLE Weight Bearing: Weight bearing as tolerated Pain: Pain Assessment Pain Scale: 0-10 Pain Score: 0-No pain  Therapy/Group: Individual Therapy  Chirag Krueger [name] 12/22/2018, 10:55 AM

## 2018-12-22 NOTE — Progress Notes (Signed)
[place] PHYSICAL MEDICINE & REHABILITATION PROGRESS NOTE   Subjective/Complaints:  Still having loose/soft stools. Has fatigue  ROS: Patient denies fever, rash, sore throat, blurred vision, nausea, vomiting, diarrhea, cough, shortness of breath or chest pain, joint or back pain, headache, or mood change.     Objective:   No results found. Recent Labs    12/22/18 0709  WBC 2.7*  HGB 8.8*  HCT 26.4*  PLT 57*   Recent Labs    12/22/18 0709  NA 140  K 3.8  CL 109  CO2 24  GLUCOSE 117*  BUN 31*  CREATININE 1.43*  CALCIUM 8.6*    Intake/Output Summary (Last 24 hours) at 12/22/2018 1117 Last data filed at 12/22/2018 0900 Gross per 24 hour  Intake 630 ml  Output -  Net 630 ml     Physical Exam: Vital Signs Blood pressure (!) 150/41, pulse 60, temperature 98 F (36.7 C), temperature source Oral, resp. rate 16, height 5\' 1"  (1.549 m), weight 50.2 kg, SpO2 100 %.  Constitutional: No distress . Vital signs reviewed. HEENT: EOMI, oral membranes moist Neck: supple Cardiovascular: brady with murmur. No JVD    Respiratory: CTA Bilaterally without wheezes or rales. Normal effort    GI: BS +, non-tender, non-distended  Musculoskeletal:Normal range of motion.  General: No deformityor edema.  Neurological: She is alertand oriented to person, place, and time. Right facial weakness with mild dysarthria. RUE   1+/ to 2-5 biceps, delt, triceps wrist and finger flexion. RLE  2 to 2+/5 except for ADF which remains tr to absent. Senses pain and light touch in all 4. Fair insight and awareness. Normal language. Good insight and awareness. Skin: Skin iswarmand dry. She isnot diaphoretic.  scattered bruisingface/limbs all resolving Psychiatric: pleasant     Assessment/Plan: 1. 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  Physiatrist is providing close team supervision and 24 hour management of active medical problems listed below.  Physiatrist and rehab team continue to assess barriers to discharge/monitor patient progress toward functional and medical goals  Care Tool:  Bathing  Bathing activity did not occur: Refused Body parts bathed by patient: Right arm, Chest, Abdomen, Right upper leg, Left upper leg, Face, Front perineal area, Buttocks   Body parts bathed by helper: Left arm, Right lower leg, Left lower leg     Bathing assist Assist Level: Minimal Assistance - Patient > 75%     Upper Body Dressing/Undressing Upper body dressing Upper body dressing/undressing activity did not occur (including orthotics): Safety/medical concerns What is the patient wearing?: Pull over shirt    Upper body assist Assist Level: Minimal Assistance - Patient > 75%    Lower Body Dressing/Undressing Lower body dressing      What is the patient wearing?: Underwear/pull up, Pants     Lower body assist Assist for lower body dressing: Minimal Assistance - Patient > 75%     Toileting Toileting Toileting Activity did not occur (Clothing management and hygiene only): N/A (no void or bm)  Toileting assist Assist for toileting: Minimal Assistance - Patient > 75%     Transfers Chair/bed transfer  Transfers assist     Chair/bed transfer assist level: Contact Guard/Touching assist     Locomotion Ambulation   Ambulation assist   Ambulation activity did not occur: Safety/medical concerns  Assist level: Contact Guard/Touching assist Assistive  device: Walker-rolling Max distance: 150'   Walk 10 feet activity   Assist  Walk 10 feet activity did not occur: Safety/medical concerns  Assist level: Contact Guard/Touching assist Assistive device: Walker-rolling   Walk 50 feet activity   Assist Walk 50 feet with 2 turns activity did not occur: Safety/medical  concerns  Assist level: Contact Guard/Touching assist Assistive device: Walker-rolling    Walk 150 feet activity   Assist Walk 150 feet activity did not occur: Safety/medical concerns  Assist level: Contact Guard/Touching assist Assistive device: Walker-rolling    Walk 10 feet on uneven surface  activity   Assist Walk 10 feet on uneven surfaces activity did not occur: Safety/medical concerns         Wheelchair     Assist Will patient use wheelchair at discharge?: Yes Type of Wheelchair: Manual    Wheelchair assist level: Supervision/Verbal cueing Max wheelchair distance: 150    Wheelchair 50 feet with 2 turns activity    Assist        Assist Level: Supervision/Verbal cueing   Wheelchair 150 feet activity     Assist  Wheelchair 150 feet activity did not occur: Safety/medical concerns   Assist Level: Supervision/Verbal cueing   Blood pressure (!) 150/41, pulse 60, temperature 98 F (36.7 C), temperature source Oral, resp. rate 16, height 5\' 1"  (1.549 m), weight 50.2 kg, SpO2 100 %.   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 Pt required transfer to acute d/t seizures and heart block --Continue CIR therapies including PT, OT, and SLP as tolerated  - I spoke with son [name] at length today re: prognosis, medical and rehab considerations as well as ELOS. He had concerns over he bp's mostly and whether she might benefit from a longer stay with [place]  -ELOS 9/19 2. Antithrombotics: -DVT/anticoagulation:Mechanical:Sequential compression devices, below kneeBilateral lower extremitiesdue to low platelets. -antiplatelet therapy: DAPTwith ASA and plavix 3. Pain Management:tylenol prn. 4. Mood:LCSW to follow for evaluation and support. -antipsychotic agents: N/A 5. Neuropsych: This patientiscapable of making  decisions on herown behalf. 6. Skin/Wound Care:Monitor groin incisions for healing. Monitor right groin hematoma. 7. Fluids/Electrolytes/Nutrition:Monitor I/O.encourage PO 8. TAVR/CHB s/p PPM: On ASA/Plavix due to recent TAVR.   -metoprolol as below 9. New onset focal motor seizures: Continue Keppra bid X 3 months. Follow up with Dr. Felecia Shelling after discharge.  11. Loose stools:  Most likely cause is her tagrisso  -off senna   -stopped Fe+ supp  -continue probiotic and fiber  -some improvement in stool consistency as a whole 12. Acute on chronic anemia: no signs of bleeding.   H/H trend has been around 7-8 range overall during stay.     9/7- Hb 8.0--->9/10 7.0-->7.4 9/11-->8.8 9/14  -continue aranesp (9/11) 13. Acute on chronic renal failure (CKD III): Hyperkalemia resolved--  -ReportsSCr1.9 one month PTA.  9/14 Cr 1.43 14. Chronic diastolic CHF: Monitor for signs of overload.   Continue Imdur, ASA and   Filed Weights   12/20/18 0536 12/21/18 0403 12/22/18 0507  Weight: 50.1 kg 50.2 kg 50.2 kg    -weights have been generally stable 15. Metastatic lung cancer/chronic right pleural effusion: On Tagrisso  discussed with RPh that QTc monitoring is reocommended weekly.   -EKG pending today.  16. Acute on chronic thrombocytopenia: Baseline around 50-->38.   9/14- Plts 57k - stable 17. Neutropenia: Has been in 2-3 range--monitor ANC for now.  9/14- WBC   2.7 18. Muscle  cramps/spasms- 9/5- has Zanaflex 2 mg q8 hours prn-helpful but it may be exacerbating fluctations in bp  Stopped tizanidine d/t low bp's 19. HTN Vitals:   12/21/18 2017 12/22/18 0507  BP: (!) 153/46 (!) 150/41  Pulse: 62 60  Resp:  16  Temp:  98 F (36.7 C)  SpO2:  300%  elevated systolic this am , widened pulse pressure may be from valvular ht disease, stiff aorta   -plan is to treat SBP  -toprol XL 50mg  and imdur 30mg  daily      LOS: 10 days A FACE TO FACE EVALUATION WAS PERFORMED  Meredith Staggers 12/22/2018, 11:17 AM

## 2018-12-22 NOTE — Progress Notes (Signed)
Occupational Therapy Session Note  Patient Details  Name: Stacy Cook MRN: [id number] Date of Birth: 30-Jan-1936  Today's Date: 12/22/2018 OT Individual Time: 6761-9509 OT Individual Time Calculation (min): 75 min    Short Term Goals: Week 2:  OT Short Term Goal 1 (Week 2): LTG=STG 2/2 ELOS  Skilled Therapeutic Interventions/Progress Updates:    Pt received supine with no c/o pain but c/o fatigue. Pt completed bed mobility to EOB with steadying assist. Pt completed ambulatory transfer into the bathroom with CGA, assist for placing R UE on orthosis. Pt transferred onto toilet with CGA, managing clothing. Pt doffed clothing, asking for assistance d/t not wanting to be light headed when bending over. Pt transferred onto TTB in the walk in shower with CGA. Pt washed UB with min A for lifting RUE. Pt stood with CGA and washed peri areas. Pt donned shirt with min A for threading RUE over long sleeve. Pt donned pants with CGA. Pt returned to EOB and teds were donned, as well as shoes with AFO. Pt was transported to the therapy gym with total A for time management. Pt completed gravity eliminated R UE scapular retraction and protraction. Pt edu on importance of scapular stability for safe R UE mobility. Pt required no physical assist to complete full AROM within gravity eliminated position. Pt also completed elbow flex/ext with gravity eliminated. Pt edu on ability to do this in her room on her own. Pt was returned to her room and left sitting up with cahir alarm belt fastened. NT present and all needs met.   Therapy Documentation Precautions:  Precautions Precautions: ICD/Pacemaker, Fall, Other (comment)(seizures) Precaution Comments: right hemiparesis Restrictions Weight Bearing Restrictions: No    Therapy/Group: Individual Therapy  Curtis Sites 12/22/2018, 7:31 AM

## 2018-12-22 NOTE — Progress Notes (Signed)
Occupational Therapy Session Note  Patient Details  Name: Stacy Cook MRN: [id number] Date of Birth: Jul 31, 1935  Today's Date: 12/22/2018 OT Individual Time: 1445-1535 OT Individual Time Calculation (min): 50 min    Short Term Goals: Week 2:  OT Short Term Goal 1 (Week 2): LTG=STG 2/2 ELOS  Skilled Therapeutic Interventions/Progress Updates:    Patient seated in w/c, son present. She is pleasant and talkative t/o session today.  Completed right UE AAROM/motor control activities in stance with left hand supported by bed rail - she was able to tolerate standing activities for majority of session today with CS and no LOB.  Ambulation in room with RW and right hand support with CG/CS, SPT to/from toilet, bed and w/c with CS.  toileting with CGA/CS.  She returned to bed at close of session with CS.  Bed alarm set and call bell in reach.    Therapy Documentation Precautions:  Precautions Precautions: ICD/Pacemaker, Fall, Other (comment)(seizures) Precaution Comments: right hemiparesis Restrictions Weight Bearing Restrictions: No RUE Weight Bearing: Non weight bearing RLE Weight Bearing: Weight bearing as tolerated LLE Weight Bearing: Weight bearing as tolerated General:   Vital Signs: Therapy Vitals Temp: 97.6 F (36.4 C) Pulse Rate: 60 Resp: 14 BP: (!) 150/50 Patient Position (if appropriate): Sitting Oxygen Therapy SpO2: 100 % O2 Device: Room Air Pain: Pain Assessment Pain Scale: 0-10 Pain Score: 0-No pain   Other Treatments:     Therapy/Group: Individual Therapy  Carlos Levering 12/22/2018, 4:18 PM

## 2018-12-22 NOTE — Progress Notes (Signed)
DBP 32 at 1408, 32 at 1809 and 28 at 1928. Patient reports feeling like she's more "winded" this afternoon/evening. Assisted patient to bed. Reports not eating and drinking much. "I'm just not hungry". Pushed fluids and she ate crackers and peanut butter. BP rechecked at 2017-153/46. Reports feeling "a lot" better. Paged Dr. Dianna Limbo, orders to wear teds when OOB. Made patient aware. Refuses SCD's. Restless night, PRN restoril given at 0145 and effective. Stacy Cook A

## 2018-12-22 NOTE — Progress Notes (Addendum)
Physical Therapy Session Note  Patient Details  Name: Stacy Cook MRN: [id number] Date of Birth: 1935-07-28  Today's Date: 12/22/2018 PT Individual Time: 1130-1203 PT Individual Time Calculation (min): 33 min   Short Term Goals:  Week 2:  PT Short Term Goal 1 (Week 2): =LTGs due to ELOS  Skilled Therapeutic Interventions/Progress Updates:   Pt seated in w/c.  She denied pain.  She stated that she was "wobbly " today. She continues to be anemic; she stated that this has been a long term problem for her since her teen age years, but is now worse due to multiple medical factors.   Gait training using wall bar L, RAFO, x 8' forward and backward .  Pt lacks R knee flexion, and R hip flexion is limited with resulting marginal foot clearance.  neuromuscular re-education via demo and mulitmodal cues for R knee flexion facilitation, kicking cones in standing, leaning against wall on L for stability and confidence.  Pt improved with practice and tapping cues to hamstrings; x 7 cones x 2, seated rest, then repeat. Use of bil LEs to propel w/c, scooted forward on seat, with cues to pull with R foot as she contacted heel to floor.  To target R hamstrings further, use of RLE only to propel w/c with min assist PRN.    Toilet transfer and clothing mgt using wall bar, CGA for continent urination.   At end of session, pt seated in w/c with seat belt alarm set and needs at hand.       Therapy Documentation Precautions:  Precautions Precautions: ICD/Pacemaker, Fall, Other (comment)(seizures) Precaution Comments: right hemiparesis Restrictions Weight Bearing Restrictions: No RUE Weight Bearing: Non weight bearing RLE Weight Bearing: Weight bearing as tolerated LLE Weight Bearing: Weight bearing as tolerated        Therapy/Group: Individual Therapy  Zoltan Genest 12/22/2018, 12:06 PM

## 2018-12-23 ENCOUNTER — Inpatient Hospital Stay (HOSPITAL_COMMUNITY): Payer: Medicare Other | Admitting: Occupational Therapy

## 2018-12-23 ENCOUNTER — Inpatient Hospital Stay (HOSPITAL_COMMUNITY): Payer: Medicare Other | Admitting: Physical Therapy

## 2018-12-23 MED ORDER — MEGESTROL ACETATE 400 MG/10ML PO SUSP
400.0000 mg | Freq: Two times a day (BID) | ORAL | Status: DC
  Administered 2018-12-23 – 2018-12-26 (×8): 400 mg via ORAL
  Filled 2018-12-23 (×8): qty 10

## 2018-12-23 NOTE — Progress Notes (Signed)
[facility] PHYSICAL MEDICINE & REHABILITATION PROGRESS NOTE   Subjective/Complaints:  Feels fatigued still. Doesn't think she's progressed. Po intake sporadic.  ROS: Patient denies fever, rash, sore throat, blurred vision, nausea, vomiting, diarrhea, cough, shortness of breath or chest pain, joint or back pain, headache, or mood change.     Objective:   No results found. Recent Labs    12/22/18 0709  WBC 2.7*  HGB 8.8*  HCT 26.4*  PLT 57*   Recent Labs    12/22/18 0709  NA 140  K 3.8  CL 109  CO2 24  GLUCOSE 117*  BUN 31*  CREATININE 1.43*  CALCIUM 8.6*    Intake/Output Summary (Last 24 hours) at 12/23/2018 1111 Last data filed at 12/23/2018 0900 Gross per 24 hour  Intake 240 ml  Output -  Net 240 ml     Physical Exam: Vital Signs Blood pressure (!) 144/46, pulse (!) 59, temperature 98.3 F (36.8 C), temperature source Oral, resp. rate 15, height 5\' 1"  (1.549 m), weight 48.5 kg, SpO2 100 %.  Constitutional: No distress . Vital signs reviewed. HEENT: EOMI, oral membranes moist Neck: supple Cardiovascular: brady without murmur. No JVD    Respiratory: CTA Bilaterally without wheezes or rales. Normal effort    GI: BS +, non-tender, non-distended  Musculoskeletal:Normal range of motion.  General: No deformityor edema.  Neurological: She is alertand oriented to person, place, and time. Right facial weakness with mild dysarthria. RUE    2-5 biceps, delt, triceps wrist and finger flexion. RLE  2 to 2+/5 except for ADF which remains tr to absent. Senses pain and light touch in all 4. Good insight and awareness Skin: Skin iswarmand dry. She isnot diaphoretic.  scattered bruisingface/limbs all resolving Psychiatric: pleasant     Assessment/Plan: 1. 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  Physiatrist is providing close team supervision and 24 hour management of active medical problems listed below.  Physiatrist and rehab team continue to assess barriers to discharge/monitor patient progress toward functional and medical goals  Care Tool:  Bathing  Bathing activity did not occur: Refused Body parts bathed by patient: Right arm, Chest, Abdomen, Right upper leg, Left upper leg, Face, Front perineal area, Buttocks   Body parts bathed by helper: Left arm, Right lower leg, Left lower leg     Bathing assist Assist Level: Minimal Assistance - Patient > 75%     Upper Body Dressing/Undressing Upper body dressing Upper body dressing/undressing activity did not occur (including orthotics): Safety/medical concerns What is the patient wearing?: Pull over shirt    Upper body assist Assist Level: Minimal Assistance - Patient > 75%    Lower Body Dressing/Undressing Lower body dressing      What is the patient wearing?: Underwear/pull up, Pants     Lower body assist Assist for lower body dressing: Minimal Assistance - Patient > 75%     Toileting Toileting Toileting Activity did not occur (Clothing management and hygiene only): N/A (no void or bm)  Toileting assist Assist for toileting: Contact Guard/Touching assist     Transfers Chair/bed transfer  Transfers assist     Chair/bed transfer assist level: Supervision/Verbal cueing     Locomotion Ambulation   Ambulation assist   Ambulation activity did not occur: Safety/medical concerns  Assist level: Supervision/Verbal cueing Assistive device: Walker-rolling Max distance: 150'  Walk 10 feet activity   Assist  Walk 10 feet activity did not occur: Safety/medical concerns  Assist level: Supervision/Verbal cueing Assistive device: Walker-rolling   Walk 50 feet activity   Assist Walk 50 feet with 2 turns activity did not occur: Safety/medical concerns  Assist level: Supervision/Verbal  cueing Assistive device: Walker-rolling    Walk 150 feet activity   Assist Walk 150 feet activity did not occur: Safety/medical concerns  Assist level: Supervision/Verbal cueing Assistive device: Walker-rolling    Walk 10 feet on uneven surface  activity   Assist Walk 10 feet on uneven surfaces activity did not occur: Safety/medical concerns         Wheelchair     Assist Will patient use wheelchair at discharge?: Yes Type of Wheelchair: Manual    Wheelchair assist level: Supervision/Verbal cueing Max wheelchair distance: 150    Wheelchair 50 feet with 2 turns activity    Assist        Assist Level: Supervision/Verbal cueing   Wheelchair 150 feet activity     Assist  Wheelchair 150 feet activity did not occur: Safety/medical concerns   Assist Level: Supervision/Verbal cueing   Blood pressure (!) 144/46, pulse (!) 59, temperature 98.3 F (36.8 C), temperature source Oral, resp. rate 15, height 5\' 1"  (1.549 m), weight 48.5 kg, SpO2 100 %.   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 Pt required transfer to acute d/t seizures and heart block --Continue CIR therapies including PT, OT, and SLP as tolerated  - team conf today  -ELOS 9/19. HH--->outpt therapies 2. Antithrombotics: -DVT/anticoagulation:Mechanical:Sequential compression devices, below kneeBilateral lower extremitiesdue to low platelets. -antiplatelet therapy: DAPTwith ASA and plavix 3. Pain Management:tylenol prn. 4. Mood:LCSW to follow for evaluation and support. -antipsychotic agents: N/A 5. Neuropsych: This patientiscapable of making decisions on herown behalf. 6. Skin/Wound Care:Monitor groin incisions for healing. Monitor right groin hematoma. 7. Fluids/Electrolytes/Nutrition:Monitor I/O.encourage PO  -have discussed importance  of PO with patient  -intake not improving  -begin megace trial.  8. TAVR/CHB s/p PPM: On ASA/Plavix due to recent TAVR.   -metoprolol as below 9. New onset focal motor seizures: Continue Keppra bid X 3 months. Follow up with Dr. Felecia Shelling after discharge.  11. Loose stools:  Most likely cause is her tagrisso  -off senna   -stopped Fe+ supp  -continue probiotic and fiber  -some improvement in stool consistency as a whole 12. Acute on chronic anemia: no signs of bleeding.   H/H trend has been around 7-8 range overall during stay.     9/7- Hb 8.0--->9/10 7.0-->7.4 9/11-->8.8 9/14  -continue aranesp (9/11)  -recheck CBC Thursday 13. Acute on chronic renal failure (CKD III): Hyperkalemia resolved--  -ReportsSCr1.9 one month PTA.  9/14 Cr 1.43 14. Chronic diastolic CHF: Monitor for signs of overload.   Continue Imdur, ASA and   Filed Weights   12/21/18 0403 12/22/18 0507 12/23/18 0459  Weight: 50.2 kg 50.2 kg 48.5 kg    -weights have been generally stable 15. Metastatic lung cancer/chronic right pleural effusion: On Tagrisso    RPh has noted QTc monitoring is reocommended weekly.   -will hold on EKG as never done and pt has been on this for some time  -pt has pacer 16. Acute on chronic thrombocytopenia: Baseline around 50-->38.   9/14- Plts 57k - stable 17. Neutropenia: Has been in 2-3 range--monitor ANC for now.  9/14- WBC   2.7 18. Muscle cramps/spasms- 9/5-  has Zanaflex 2 mg q8 hours prn-helpful but it may be exacerbating fluctations in bp  Stopped tizanidine d/t low bp's 19. HTN Vitals:   12/22/18 1945 12/23/18 0539  BP: (!) 150/43 (!) 144/46  Pulse: 60 (!) 59  Resp: 15 15  Temp: 98 F (36.7 C) 98.3 F (36.8 C)  SpO2: 100% 100%    - widened pulse pressure may be from valvular ht disease, stiff aorta   -plan is to treat SBP  -toprol XL 50mg  and imdur 30mg  daily--continue (9/15)      LOS: 11 days A FACE TO FACE EVALUATION WAS PERFORMED  Meredith Staggers 12/23/2018, 11:11 AM

## 2018-12-23 NOTE — Progress Notes (Signed)
Physical Therapy Session Note  Patient Details  Name: Stacy Cook MRN: [id number] Date of Birth: Dec 10, 1935  Today's Date: 12/23/2018 PT Individual Time: 1000-1024 AND 1100-1155 PT Individual Time Calculation (min): 24 min AND 55 min  Short Term Goals: Week 2:  PT Short Term Goal 1 (Week 2): =LTGs due to ELOS  Skilled Therapeutic Interventions/Progress Updates:   Session 1:  Pt in w/c and agreeable to therapy, no c/o pain. Ambulated to/from day room w/ close supervision w/ RW. Verbal and visual cues for increased R foot clearance w/ increased hip and knee flexion in swing. Worked on household mobility in day room while weaving around cones. Pt w/ 2-3 LOBs, able to self-correct all w/ CGA. Negotiated 4 cones x2 bouts. Seated rest breaks while performing R heel slides and LAQs w/ emphasis on slow and controlled movements. Returned to room and ended session in supine, all needs in reach.   Session 2:  Pt in supine and agreeable to therapy, no c/o pain. Bed mobility w/ supervision and ambulated to/from toilet and toilet transfer w/ CGA. Pt performed LE garment management and pericare w/ supervision. Therapist donned knee-high TEDs for BP management during activity. Discussed use of transport chair and/or manual w/c upon discharge for increased independence or for energy conservation. Pt states she would prefer manual w/c for both increased independence and for energy conservation. Pt self-propelled w/c to/from outside of hospital via L hemi technique w/ supervision in >150' bouts w/ frequent but brief seated rest breaks 2/2 fatigue. Pt continues to state she would prefer manual w/c. Worked on stair negotiation outside, negotiated 4 steps w/ R rail and then 4 steps w/ L rail, both w/ CGA and verbal cues sideways pattern as she is only able to utilize LUE, pt able to recall which foot to lead with without prompting. Ambulated over uneven sidewalk surface w/ CGA using RW and discussed carryover to  ambulating on sidewalk or patio at home. RLE NMR during rest breaks including standing knee flexion, active assist 3x10 bouts. Tactile cues for R hamstring activation. Returned to unit and ended session in w/c, all needs in reach. Discussed having pt's son present for education and to observe therapies later in week as he is very apprehensive w/ pt discharging home ([name], different from pt's other son whom she is going home with). Pt in agreement and says she will speak with him [name]).   Therapy Documentation Precautions:  Precautions Precautions: ICD/Pacemaker, Fall, Other (comment)(seizures) Precaution Comments: right hemiparesis Restrictions Weight Bearing Restrictions: No RUE Weight Bearing: Non weight bearing RLE Weight Bearing: Weight bearing as tolerated LLE Weight Bearing: Weight bearing as tolerated  Therapy/Group: Individual Therapy  [name] [name] 12/23/2018, 10:25 AM

## 2018-12-23 NOTE — Progress Notes (Signed)
Occupational Therapy Session Note  Patient Details  Name: Stacy Cook MRN: [id number] Date of Birth: 02-04-1936  Today's Date: 12/23/2018 OT Individual Time: 1415-1530 OT Individual Time Calculation (min): 75 min   Short Term Goals: Week 2:  OT Short Term Goal 1 (Week 2): LTG=STG 2/2 ELOS  Skilled Therapeutic Interventions/Progress Updates:     Pt greeted seated in wc and agreeable to OT treatment session. Pt reported need to go to the bathroom. Pt ambulated to bathroom w/ RW and CGA. CGA for balance when doffing pants. Pt voided bladder and completed peri-care with supervision. Pt ambulated out of bathroom in similar fashion. Pt brought to therapy apartment in wc and practiced tub bench transfer with CGA. Discussed home bathroom set-up and safe transfers + DME needs. Majority of session spent on R UE neuro re-ed. Pt brought to therapy gym and tried to bring pt into quadruped on therapy mat. Pt unable to achieve quadruped, so worked on R UE weight bearing with pushing up onto R elbow. Sitting on therapy mat worked on cup stacking task with OT assist to open grasp, then pt able to maintain grasp, move cup across body and lift off of the table, but needed OT assistance to bend elbow and wrist enough to stack. After repetition, OT placed R hand around cup, then pt eventually able to maintain grip on cup, supinate, lift wrist, and bend elbow enough to stack cup without OT assist! Pt pivoted back to wc with supervision and returned to room. Supervision stand-pivot back to EOB. Pt able to doff shoes and AFO with encouragement and supervision. Pt left semi-reclined in bed with bed alarm on, call bell in reach, and needs met.    Therapy Documentation Precautions:  Precautions Precautions: ICD/Pacemaker, Fall, Other (comment)(seizures) Precaution Comments: right hemiparesis Restrictions Weight Bearing Restrictions: No RUE Weight Bearing: Non weight bearing RLE Weight Bearing: Weight bearing as  tolerated LLE Weight Bearing: Weight bearing as tolerated Pain: Denies pain  Therapy/Group: Individual Therapy  Valma Cava 12/23/2018, 2:56 PM

## 2018-12-23 NOTE — Progress Notes (Signed)
Occupational Therapy Session Note  Patient Details  Name: Stacy Cook MRN: [id number] Date of Birth: 1935/07/23  Today's Date: 12/23/2018 OT Individual Time: 4356-8616 OT Individual Time Calculation (min): 30 min   Skilled Therapeutic Interventions/Progress Updates:    1:1 Pt in bed when arrived. Came to EOB and shoes and AFO donned with min A (for AFO). Patient ambulated to the bathroom with RW with contact guard with min A for clear threshold into bathroom. Pt able to perform toileting sit to stand with contact guard. Pt ambulated back out to the sink and engaged in hand washing with min A for right UE. Pt went to sit in the w/c with min guard. Continued to focus on on functional use of right UE including loose grasp, supination/ pronation, and functional reach with min A against gravity. Pt left drinking juice for breakfast.   Therapy Documentation Precautions:  Precautions Precautions: ICD/Pacemaker, Fall, Other (comment)(seizures) Precaution Comments: right hemiparesis Restrictions Weight Bearing Restrictions: No RUE Weight Bearing: Non weight bearing RLE Weight Bearing: Weight bearing as tolerated LLE Weight Bearing: Weight bearing as tolerated Pain: Pain Assessment Pain Scale: 0-10 Pain Score: 0-No pain   Therapy/Group: Individual Therapy  Stacy Cook [name] 12/23/2018, 12:29 PM

## 2018-12-24 ENCOUNTER — Inpatient Hospital Stay (HOSPITAL_COMMUNITY): Payer: Medicare Other | Admitting: Physical Therapy

## 2018-12-24 ENCOUNTER — Inpatient Hospital Stay (HOSPITAL_COMMUNITY): Payer: Medicare Other | Admitting: Occupational Therapy

## 2018-12-24 DIAGNOSIS — I5032 Chronic diastolic (congestive) heart failure: Secondary | ICD-10-CM

## 2018-12-24 DIAGNOSIS — D649 Anemia, unspecified: Secondary | ICD-10-CM

## 2018-12-24 DIAGNOSIS — R0989 Other specified symptoms and signs involving the circulatory and respiratory systems: Secondary | ICD-10-CM

## 2018-12-24 LAB — GLUCOSE, CAPILLARY: Glucose-Capillary: 114 mg/dL — ABNORMAL HIGH (ref 70–99)

## 2018-12-24 NOTE — Progress Notes (Signed)
  Patient ID: Stacy Cook, female   DOB: June 02, 1935, 83 y.o.   MRN: [id number]     Diagnosis codes:  I69.351;  I50.32  Height:     5'1"           Weight:     121 lbs.       Patient suffers from left CVA with right hemiplegia and CHF   which impairs their ability to perform daily activities like toileting, dressing and mobility in the home.  A walker will not resolve issue with performing activities of daily living.  A wheelchair will allow patient to safely perform daily activities.  Patient is not able to propel themselves in the home using a standard weight wheelchair due to hemiplegia and weakness.  Patient can self propel in the lightweight wheelchair.   Reesa Chew, PA-C

## 2018-12-24 NOTE — Progress Notes (Signed)
Physical Therapy Session Note  Patient Details  Name: Stacy Cook MRN: [id number] Date of Birth: [birth year]  Today's Date: 12/24/2018 PT Individual Time: 4373-5789 AND 1235-1315 PT Individual Time Calculation (min): 55 min AND 40 min  Short Term Goals: Week 2:  PT Short Term Goal 1 (Week 2): =LTGs due to ELOS  Skilled Therapeutic Interventions/Progress Updates:   Session 1:  Pt in supine and agreeable to therapy, no c/o pain. Supine>sit w/ supervision and ambulated to/from toilet w/ close supervision, CGA for toilet transfer and LE garment management/pericare. Ambulated to/from therapy gym w/ supervision using RW and RAFO, improved R foot clearance this session. Worked on [employer] and dynamic standing balance this session w/ emphasis on maintaining balance during R lateral weight shifting. LUE reaching tasks while pinning clothespins to basketball hoop. Close supervision-CGA for balance, much improved since therapist last challenged pt's balance in this way. Task also required partial knee bends and trunk rotation, all performed w/o UE support and encouragement to trust RLE during weight acceptance. Performed in multiple bouts of standing. Returned to room and ended session in w/c all needs in reach. Provided pt w/ ensure after encouragement to eat something for energy. Provided also w/ written handout on energy conservation techniques specific to cancer-related fatigue. PA present during part of session and therapist and PA educated pt on energy conservation verbally as well.   Session 2:  Pt in supine and agreeable to therapy, no c/o pain. Supine>sit w/ supervision and stand pivot transfer to w/c. Total assist w/c transport to/from therapy gym. NuStep 5 min x2 @ level 1 w/ BLEs only, to work on LE strengthening and endurance training. 4/10 on modified RPE scale. Stood to play life-size connect 4 game w/o UE support in 4-5 min bouts. Close supervision-CGA w/ lateral weight shifting, partial knee  bends, and trunk rotation. Returned to room and ended session in supine, all needs in reach.   Therapy Documentation Precautions:  Precautions Precautions: ICD/Pacemaker, Fall, Other (comment)(seizures) Precaution Comments: right hemiparesis Restrictions Weight Bearing Restrictions: No RUE Weight Bearing: Non weight bearing RLE Weight Bearing: Weight bearing as tolerated LLE Weight Bearing: Weight bearing as tolerated Vital Signs:    Therapy/Group: Individual Therapy  Franciszek Platten [name] 12/24/2018, 12:41 PM

## 2018-12-24 NOTE — Progress Notes (Signed)
PHYSICAL MEDICINE & REHABILITATION PROGRESS NOTE   Subjective/Complaints: Patient seen sitting up in bed this morning.  She states he slept well overnight.  ROS: Denies CP, SOB, N/V/D   Objective:   No results found. Recent Labs    12/22/18 0709  WBC 2.7*  HGB 8.8*  HCT 26.4*  PLT 57*   Recent Labs    12/22/18 0709  NA 140  K 3.8  CL 109  CO2 24  GLUCOSE 117*  BUN 31*  CREATININE 1.43*  CALCIUM 8.6*    Intake/Output Summary (Last 24 hours) at 12/24/2018 1204 Last data filed at 12/23/2018 1300 Gross per 24 hour  Intake 120 ml  Output -  Net 120 ml     Physical Exam: Vital Signs Blood pressure (!) 140/28, pulse 68, temperature 98.4 F (36.9 C), temperature source Oral, resp. rate 14, height 5\' 1"  (1.549 m), weight 47.1 kg, SpO2 100 %. Constitutional: No distress . Vital signs reviewed. HENT: Normocephalic.  Atraumatic. Eyes: EOMI. No discharge. Cardiovascular: No JVD. Respiratory: Normal effort.  No stridor. GI: Non-distended. Skin: Warm and dry.  Intact. Psych: Normal mood.  Normal behavior. Musc: No edema.  No tenderness. Neurological: Alert Right facial weakness Motor: RUE: 2/5 proximal distal RLE: Hip flexion, knee extension 3/5, ankle dorsiflexion 0/5  Sensation intact light touch  Assessment/Plan: 1. 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  Physiatrist is providing close team supervision and 24 hour management of active medical problems listed below.  Physiatrist and rehab team continue to assess barriers to discharge/monitor patient progress toward functional and medical goals  Care Tool:  Bathing  Bathing activity did not occur: Refused Body parts bathed by patient: Right arm, Chest, Abdomen, Right upper leg, Left upper leg, Face, Front perineal area, Buttocks    Body parts bathed by helper: Left arm, Right lower leg, Left lower leg     Bathing assist Assist Level: Minimal Assistance - Patient > 75%     Upper Body Dressing/Undressing Upper body dressing Upper body dressing/undressing activity did not occur (including orthotics): Safety/medical concerns What is the patient wearing?: Pull over shirt    Upper body assist Assist Level: Minimal Assistance - Patient > 75%    Lower Body Dressing/Undressing Lower body dressing      What is the patient wearing?: Underwear/pull up, Pants     Lower body assist Assist for lower body dressing: Minimal Assistance - Patient > 75%     Toileting Toileting Toileting Activity did not occur (Clothing management and hygiene only): N/A (no void or bm)  Toileting assist Assist for toileting: Contact Guard/Touching assist     Transfers Chair/bed transfer  Transfers assist     Chair/bed transfer assist level: Supervision/Verbal cueing     Locomotion Ambulation   Ambulation assist   Ambulation activity did not occur: Safety/medical concerns  Assist level: Supervision/Verbal cueing Assistive device: Walker-rolling Max distance: 150'   Walk 10 feet activity   Assist  Walk 10 feet activity did not occur: Safety/medical concerns  Assist level: Supervision/Verbal cueing Assistive device: Walker-rolling   Walk 50 feet activity   Assist Walk 50 feet with 2 turns activity did not occur: Safety/medical concerns  Assist level: Supervision/Verbal cueing Assistive device: Walker-rolling    Walk 150 feet activity   Assist Walk 150 feet activity did not occur: Safety/medical concerns  Assist  level: Supervision/Verbal cueing Assistive device: Walker-rolling    Walk 10 feet on uneven surface  activity   Assist Walk 10 feet on uneven surfaces activity did not occur: Safety/medical concerns         Wheelchair     Assist Will patient use wheelchair at discharge?: Yes Type of  Wheelchair: Manual    Wheelchair assist level: Supervision/Verbal cueing Max wheelchair distance: 150    Wheelchair 50 feet with 2 turns activity    Assist        Assist Level: Supervision/Verbal cueing   Wheelchair 150 feet activity     Assist  Wheelchair 150 feet activity did not occur: Safety/medical concerns   Assist Level: Supervision/Verbal cueing   Blood pressure (!) 140/28, pulse 68, temperature 98.4 F (36.9 C), temperature source Oral, resp. rate 14, height 5\' 1"  (1.549 m), weight 47.1 kg, SpO2 100 %.   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 Pt required transfer to acute d/t seizures and heart block Continue CIR 2. Antithrombotics: -DVT/anticoagulation:Mechanical:Sequential compression devices, below kneeBilateral lower extremitiesdue to low platelets. -antiplatelet therapy: DAPTwith ASA and plavix 3. Pain Management:tylenol prn. 4. Mood:LCSW to follow for evaluation and support. -antipsychotic agents: N/A 5. Neuropsych: This patientiscapable of making decisions on herown behalf. 6. Skin/Wound Care:Monitor groin incisions for healing. Monitor right groin hematoma. 7. Fluids/Electrolytes/Nutrition:Monitor I/O.encourage PO  P.o. intake inconsistent, but overall low  -megace  8. TAVR/CHB s/p PPM: On ASA/Plavix due to recent TAVR.   -metoprolol as below 9. New onset focal motor seizures: Continue Keppra bid X 3 months. Follow up with Dr. Felecia Shelling after discharge.  11. Loose stools:  Most likely cause is her tagrisso  -off senna   -stopped Fe+ supp  -continue probiotic and fiber  -some improvement in stool consistency as a whole 12. Acute on chronic anemia: no signs of bleeding.     Hemoglobin 8.8 on 9/14  -continue aranesp (9/11)  -recheck CBC tomorrow 13. Acute on chronic renal failure (CKD III):  Hyperkalemia resolved--  -ReportsSCr1.9 one month PTA.  9/14 Cr 1.43 14. Chronic diastolic CHF: Monitor for signs of overload.   Continue Imdur, ASA and   Filed Weights   12/22/18 0507 12/23/18 0459 12/24/18 0545  Weight: 50.2 kg 48.5 kg 47.1 kg    Improving on 9/16 15. Metastatic lung cancer/chronic right pleural effusion: On Tagrisso  RPh has noted QTc monitoring is reocommended weekly.   -will hold on EKG as never done and pt has been on this for some time  -pt has pacer 16. Acute on chronic thrombocytopenia: Baseline around 50-->38.     Platelets 57 on 9/14  Continue to monitor 17. Neutropenia: Has been in 2-3 range--monitor ANC for now.  WBC 2.7 on 9/14  Continue to monitor 18. Muscle cramps/spasms- 9/5- has Zanaflex 2 mg q8 hours prn-helpful but it may be exacerbating fluctations in bp  Stopped tizanidine d/t low bp's 19. HTN Vitals:   12/23/18 1941 12/24/18 0545  BP: (!) 152/47 (!) 140/28  Pulse: 62 68  Resp: 15 14  Temp: 98 F (36.7 C) 98.4 F (36.9 C)  SpO2: 99% 100%    - widened pulse pressure may be from valvular ht disease, stiff aorta   -plan is to treat SBP  Continue toprol XL 50mg  and imdur 30mg  daily  Remains extremely labile with diastolic recording of 28,?  Accuracy on 9/16     LOS: 12 days A FACE  TO FACE EVALUATION WAS PERFORMED   Lorie Phenix 12/24/2018, 12:04 PM

## 2018-12-24 NOTE — Plan of Care (Signed)
  Problem: Consults Goal: RH STROKE PATIENT EDUCATION Description: See Patient Education module for education specifics  Outcome: Progressing   Problem: RH BOWEL ELIMINATION Goal: RH STG MANAGE BOWEL WITH ASSISTANCE Description: STG Manage Bowel with  mod I Assistance. Outcome: Progressing Flowsheets (Taken 12/24/2018 1715) STG: Pt will manage bowels with assistance: 5-Supervision/curing   Problem: RH SAFETY Goal: RH STG DECREASED RISK OF FALL WITH ASSISTANCE Description: STG Decreased Risk of Fall With cues/reminders supervision Assistance. Outcome: Progressing Flowsheets (Taken 12/24/2018 1715) WNU:[id number] risk of fall  with assistance/device: 4-Minimal assistance   Problem: RH KNOWLEDGE DEFICIT Goal: RH STG INCREASE KNOWLEDGE OF HYPERTENSION Description: Pt will be able to manage HTN using medications and dietary restrictions following handouts and educational information independently Outcome: Progressing Goal: RH STG INCREASE KNOWLEGDE OF HYPERLIPIDEMIA Description:  Pt will be able to manage HLD using medications and dietary restrictions following handouts and educational information independently Outcome: Progressing Goal: RH STG INCREASE KNOWLEDGE OF STROKE PROPHYLAXIS Description: Pt will be able to manage secondary stroke prevention using handouts and educational information independently Outcome: Progressing

## 2018-12-24 NOTE — Patient Care Conference (Signed)
Inpatient RehabilitationTeam Conference and Plan of Care Update Date: 12/23/2018   Time: 10:45 AM    Patient Name: Stacy Cook [facility]      Medical Record Number: [id number]  Date of Birth: 07/28/1935 Sex: Female         Room/Bed: 4W23C/4W23C-01 Payor Info: Payor: [employer] MEDICARE / Plan: [facility] MEDICARE / Product Type: *No Product type* /    Admit Date/Time:  12/12/2018  6:24 PM  Primary Diagnosis:  Embolic stroke [facility])  Patient Active Problem List   Diagnosis Date Noted  . Right hemiparesis ([place])   . Muscle spasm of right lower extremity   . Neutropenia ([place])   . Heart block 12/10/2018  . Seizures ([place]) 12/09/2018  . Cardiac asystole ([place]) 12/09/2018  . Anxiety state   . Anemia of chronic disease   . Acute renal failure ([place])   . Embolic stroke ([place]) 35/00/9381  . S/P TAVR (transcatheter aortic valve replacement) 12/02/2018  . Acute on chronic diastolic heart failure ([place]) 12/02/2018  . Anemia   . GERD (gastroesophageal reflux disease)   . Hypertension 02/14/2018  . Acquired iron deficiency anemia due to decreased absorption 09/09/2017  . Adenocarcinoma of gastric cardia ([place]) 04/10/2016  . Carotid stenosis 02/07/2016  . Primary malignant neoplasm of right lower lobe of lung ([place]) 12/02/2015  . B12 deficiency 03/24/2015  . CAD S/P percutaneous coronary angioplasty 03/24/2015  . Neuroendocrine tumor 03/24/2015  . Type 2 diabetes mellitus ([place]) 03/24/2015  . Thrombocytopenia ([place]) 11/10/2014  . Neuritis or radiculitis due to rupture of lumbar intervertebral disc 09/10/2014  . Nerve root inflammation 03/11/2014  . HLD (hyperlipidemia) 08/24/2013    Expected Discharge Date: Expected Discharge Date: 12/27/18  Team Members Present: Physician leading conference: Dr. Alger Simons Social Worker Present: Lennart Pall, LCSW Nurse Present: Doy Hutching, LPN PT Present: Donnamae Jude, PT OT Present: Cherylynn Ridges, OT SLP Present: Weston Anna, SLP PPS Coordinator present :  Gunnar Fusi, SLP     Current Status/Progress Goal Weekly Team Focus  Bowel/Bladder   Pt is continent B/B. LBM 12/20/2018.  Maintain regular bowel pattern.  Assist with toileting needs PRN.   Swallow/Nutrition/ Hydration             ADL's   Min A/supervision  Supervision  R NMR, functional transfers, self-care retraining, balance, general strengthening, activity tolerance   Mobility   min assist-close supervision overall, gait 100-150' w/ RW  supervision overall  endurance, global strengthening, education and d/c planning, RLE NMR   Communication             Safety/Cognition/ Behavioral Observations            Pain   No complaints of pain.  Remain pain free.  Assess pain Q shift and PRN.   Skin   Pt has 2 abd incisions.  Treat skin issues per orders.  Assess skin Q shift and PRN.    Rehab Goals Patient on target to meet rehab goals: Yes *See Care Plan and progress notes for long and short-term goals.     Barriers to Discharge  Current Status/Progress Possible Resolutions Date Resolved   Nursing                  PT                    OT                  SLP  SW                Discharge Planning/Teaching Needs:  Pt to d/c home with family to arrange 24/7 assistance.  Teaching to be planned closer to d/c.   Team Discussion:  Pt with ongoing fatigue but making steady progress.  MD monitoring Hgb.  Supervision to min assist with ADLs;  Showing some motor return in UE.  Min assist with amb ~ 250'.  Supervision goals overall.  Ready for family ed with both sons.  Revisions to Treatment Plan:  NA    Medical Summary Current Status: ongoing fatigue. hgb stable. loose stools from ctx. intake not optimal. wide pulse pressure Weekly Focus/Goal: nutrition. bp's, cv, anemia  Barriers to Discharge: Medical stability       Continued Need for Acute Rehabilitation Level of Care: The patient requires daily medical management by a physician with specialized  training in physical medicine and rehabilitation for the following reasons: Direction of a multidisciplinary physical rehabilitation program to maximize functional independence : Yes Medical management of patient stability for increased activity during participation in an intensive rehabilitation regime.: Yes Analysis of laboratory values and/or radiology reports with any subsequent need for medication adjustment and/or medical intervention. : Yes   I attest that I was present, lead the team conference, and concur with the assessment and plan of the team.   Lennart Pall 12/24/2018, 10:29 AM    Team conference was held via web/ teleconference due to [place] - 19

## 2018-12-24 NOTE — Progress Notes (Signed)
Social Work Patient ID: Stacy Cook, female   DOB: October 08, 1935, 83 y.o.   MRN: [id number]  Have reviewed team conference with pt and two sons and all agreed they feel ready for d/c on Saturday.  Have scheduled for both sons to be here tomorrow 10-12 to complete family education.  They are to decide tomorrow if they prefer [facility] or OP follow up therapies.  Kailany Dinunzio, LCSW

## 2018-12-24 NOTE — Progress Notes (Signed)
Occupational Therapy Session Note  Patient Details  Name: Stacy Cook MRN: [id number] Date of Birth: 1935/10/30  Today's Date: 12/24/2018 OT Individual Time: 0945-1100 OT Individual Time Calculation (min): 75 min    Short Term Goals: Week 2:  OT Short Term Goal 1 (Week 2): LTG=STG 2/2 ELOS  Skilled Therapeutic Interventions/Progress Updates:    Treatment session with focus on RUE NMR and dynamic standing balance.  Pt received upright in w/c declining need to complete any self-care tasks this AM.  Pt agreeable to focus on RUE NMR.  Completed all stand pivot transfers throughout session with RW with CGA.  Engaged in [place] in supine with focus on shoulder flexion and abduction, therapist providing tactile cues and facilitation to further ROM.  Engaged in shoulder flexion in sidelying with improved success with decreased effects of gravity in sidelying.  Utilized UE Ranger in sitting with focus on horizontal abduction/adduction and shoulder flexion/extension in horizontal plane, requiring cues for improved motor control.  Increased challenge to incorporating shoulder flexion in more vertical plane while continuing to utilize UE Ranger.  Attempted to engage in grasp activity with medium sized blocks, pt requiring hand over hand assist to grasp and maintain grasp on blocks to attempt to stack.  Pt ambulated 100' with RW with CGA and returned to room.  Engaged in toileting task with CGA and ambulated back to bed with RW with CGA.    Therapy Documentation Precautions:  Precautions Precautions: ICD/Pacemaker, Fall, Other (comment)(seizures) Precaution Comments: right hemiparesis Restrictions Weight Bearing Restrictions: No RUE Weight Bearing: Non weight bearing RLE Weight Bearing: Weight bearing as tolerated LLE Weight Bearing: Weight bearing as tolerated Pain:  Pt with no c/o pain   Therapy/Group: Individual Therapy  Simonne Come 12/24/2018, 11:41 AM

## 2018-12-25 ENCOUNTER — Encounter (HOSPITAL_COMMUNITY): Payer: Medicare Other | Admitting: Occupational Therapy

## 2018-12-25 ENCOUNTER — Inpatient Hospital Stay (HOSPITAL_COMMUNITY): Payer: Medicare Other | Admitting: Occupational Therapy

## 2018-12-25 ENCOUNTER — Ambulatory Visit (HOSPITAL_COMMUNITY): Payer: Medicare Other | Admitting: Physical Therapy

## 2018-12-25 ENCOUNTER — Inpatient Hospital Stay (HOSPITAL_COMMUNITY): Payer: Medicare Other | Admitting: Physical Therapy

## 2018-12-25 LAB — CBC
HCT: 30.4 % — ABNORMAL LOW (ref 36.0–46.0)
Hemoglobin: 10.3 g/dL — ABNORMAL LOW (ref 12.0–15.0)
MCH: 32.8 pg (ref 26.0–34.0)
MCHC: 33.9 g/dL (ref 30.0–36.0)
MCV: 96.8 fL (ref 80.0–100.0)
Platelets: 60 10*3/uL — ABNORMAL LOW (ref 150–400)
RBC: 3.14 MIL/uL — ABNORMAL LOW (ref 3.87–5.11)
RDW: 15.2 % (ref 11.5–15.5)
WBC: 3.4 10*3/uL — ABNORMAL LOW (ref 4.0–10.5)
nRBC: 0 % (ref 0.0–0.2)

## 2018-12-25 NOTE — Progress Notes (Signed)
Physical Therapy Session Note  Patient Details  Name: Stacy Cook MRN: [id number] Date of Birth: July 26, 1935  Today's Date: 12/25/2018 PT Individual Time: 1100-1155 AND 1345-1425 PT Individual Time Calculation (min): 55 min AND 40 min  Short Term Goals: Week 2:  PT Short Term Goal 1 (Week 2): =LTGs due to ELOS  Skilled Therapeutic Interventions/Progress Updates:   Session 1:  Pt in w/c and agreeable to therapy, no c/o pain. Both son's present for family education. Educated both son's on using w/c for energy conservation, providing supervision for gait w/ RW, CGA for stair negotiation, and supervision for car transfer. Practiced stair negotiation x4 steps w/ R rail and then w/ L rail. Demonstrated sideways technique 2/2 RUE weakness and discussed benefits of rails on both sides. Sons planning to install at least 1 rail prior to d/c. Both verbalized understanding of technique and safety. Educated on energy conservation strategies and cancer-related fatigue. Both sons feel well-prepared to provide this level of care for pt at home and verbalized understanding of all education. NuStep 4 min @ level 3 w/ BLEs only to work on endurance and BLE strengthening. Ambulated back to room w/ supervision and ended session in w/c, all needs in reach.   Session 2:  Pt in w/c and agreeable to therapy, no c/o pain. Ambulated to/from toilet w/ supervision using RW and supervision-CGA toilet transfer, no physical assist needed w/ pericare and LE garment management. Total assist w/c transport to/from therapy gym for time management. Worked on standing postural control w/o UE support while playing lifesize connect 4 game. CGA-close supervision for balance w/ reaching to far R side requiring balance w/ trunk rotation, R lateral weight shifting, and squatting. Performed in multiple bouts of standing, 3-4 min at a time. Returned to room via w/c and stand pivot to EOB w/ supervision. Practiced donning/doffing RAFO/shoe,  needed max assist to don and discussed ways to direct her son to assist her. Ended session in supine, all needs in reach.   Therapy Documentation Precautions:  Precautions Precautions: Fall Precaution Comments: right hemiparesis Restrictions Weight Bearing Restrictions: No RUE Weight Bearing: Non weight bearing RLE Weight Bearing: Weight bearing as tolerated LLE Weight Bearing: Weight bearing as tolerated  Therapy/Group: Individual Therapy  Janasia Coverdale K Emalia Witkop 12/25/2018, 12:30 PM

## 2018-12-25 NOTE — Progress Notes (Signed)
Physical Therapy Discharge Summary  Patient Details  Name: Stacy Cook MRN: [id number] Date of Birth: 1935-08-11  Today's Date: 12/26/2018 PT Individual Time: 5056-9794 PT Individual Time Calculation (min): 45 min   Pt in w/c and agreeable to therapy, no c/o pain. Total assist w/c transport to therapy gym. Performed functional mobility as detailed below including gait, stair negotiation, w/c mobility, bed mobility, and furniture/bed<>chair transfers. Intermittent seated rest breaks 2/2 fatigue, ongoing education regarding cancer-related fatigue throughout session. Adjusted pt's w/c for home to lowest height for increased independence w/ w/c mobility. Returned to room and ended session in w/c, all needs in reach.   Patient has met 10 of 10 long term goals due to improved activity tolerance, improved balance, improved postural control, increased strength, ability to compensate for deficits and functional use of  right upper extremity and right lower extremity.  Patient to discharge at an ambulatory level Supervision, supervision w/c level for energy conservation.  Patient's care partner is independent to provide the necessary physical assistance at discharge.  Reasons goals not met: n/a  Recommendation:  Patient will benefit from ongoing skilled PT services in home health setting to continue to advance safe functional mobility, address ongoing impairments in functional balance, postural control, RLE strength, and endurance, and minimize fall risk.  Equipment: 16x16 w/c, RW  Reasons for discharge: treatment goals met and discharge from hospital  Patient/family agrees with progress made and goals achieved: Yes  PT Discharge Precautions/Restrictions Precautions Precautions: Fall Restrictions Weight Bearing Restrictions: No Vision/Perception  Perception Perception: Within Functional Limits Praxis Praxis: Intact  Cognition Overall Cognitive Status: Within Functional Limits for tasks  assessed Arousal/Alertness: Awake/alert Orientation Level: Oriented X4 Selective Attention: Appears intact Memory: Appears intact Awareness: Appears intact Problem Solving: Appears intact Safety/Judgment: Appears intact Sensation Sensation Light Touch: Impaired by gross assessment Light Touch Impaired Details: Impaired RLE Proprioception: Appears Intact Coordination Gross Motor Movements are Fluid and Coordinated: No Fine Motor Movements are Fluid and Coordinated: No Coordination and Movement Description: R side impaired 2/2 Hemiplegia Heel Shin Test: Impaired bilaterally 2/2 global weakness, RLE>LLE Motor  Motor Motor: Hemiplegia Motor - Discharge Observations: R hemi, UE>LE  Mobility Bed Mobility Bed Mobility: Rolling Right;Rolling Left;Supine to Sit;Sit to Supine Rolling Right: Independent Rolling Left: Independent Supine to Sit: Independent Sit to Supine: Independent Transfers Transfers: Sit to Stand;Stand to Sit;Stand Pivot Transfers Sit to Stand: Supervision/Verbal cueing Stand to Sit: Supervision/Verbal cueing Stand Pivot Transfers: Supervision/Verbal cueing Transfer (Assistive device): None Locomotion  Gait Ambulation: Yes Gait Assistance: Supervision/Verbal cueing Gait Distance (Feet): 150 Feet Assistive device: Rolling walker Gait Gait: Yes Gait Pattern: Impaired Gait Pattern: Decreased dorsiflexion - right;Decreased weight shift to right;Decreased hip/knee flexion - right Gait velocity: decreased Stairs / Additional Locomotion Stairs: Yes Stairs Assistance: Contact Guard/Touching assist Stair Management Technique: One rail Right Number of Stairs: 4 Height of Stairs: 6 Wheelchair Mobility Wheelchair Mobility: Yes Wheelchair Propulsion: Left upper extremity;Left lower extremity Wheelchair Parts Management: Supervision/cueing Distance: 150'  Trunk/Postural Assessment  Cervical Assessment Cervical Assessment: Within Functional Limits Thoracic  Assessment Thoracic Assessment: Within Functional Limits Lumbar Assessment Lumbar Assessment: Within Functional Limits Postural Control Postural Control: Deficits on evaluation(delayed/insufficient)  Balance Balance Balance Assessed: Yes Static Sitting Balance Static Sitting - Balance Support: Feet supported;No upper extremity supported Static Sitting - Level of Assistance: 7: Independent Dynamic Sitting Balance Dynamic Sitting - Balance Support: Feet supported;During functional activity;No upper extremity supported Dynamic Sitting - Level of Assistance: 7: Independent Static Standing Balance Static Standing - Balance Support: No upper  extremity supported;During functional activity Static Standing - Level of Assistance: 5: Stand by assistance Dynamic Standing Balance Dynamic Standing - Balance Support: During functional activity;Left upper extremity supported Dynamic Standing - Level of Assistance: 5: Stand by assistance Extremity Assessment  RLE Assessment RLE Assessment: Exceptions to [facility] Passive Range of Motion (PROM) Comments: WFL RLE Strength RLE Overall Strength: Deficits Right Hip Flexion: 3+/5 Right Hip Extension: 4-/5 Right Hip ABduction: 3+/5 Right Hip ADduction: 3+/5 Right Knee Flexion: 3-/5 Right Knee Extension: 4-/5 Right Ankle Dorsiflexion: 0/5 Right Ankle Plantar Flexion: 0/5 LLE Assessment LLE Assessment: Within Functional Limits    Quantavius Humm K Lamira Borin 12/26/2018, 4:08 PM

## 2018-12-25 NOTE — Progress Notes (Signed)
[facility] PHYSICAL MEDICINE & REHABILITATION PROGRESS NOTE   Subjective/Complaints: Overall pt feeling ok. Still somewhat fatigued. No more "drop out" episodes. Frustrated by slow progress of right arm  ROS: Patient denies fever, rash, sore throat, blurred vision, nausea, vomiting, diarrhea, cough, shortness of breath or chest pain, joint or back pain, headache, or mood change.    Objective:   No results found. No results for input(s): WBC, HGB, HCT, PLT in the last 72 hours. No results for input(s): NA, K, CL, CO2, GLUCOSE, BUN, CREATININE, CALCIUM in the last 72 hours.  Intake/Output Summary (Last 24 hours) at 12/25/2018 1016 Last data filed at 12/25/2018 0900 Gross per 24 hour  Intake 560 ml  Output -  Net 560 ml     Physical Exam: Vital Signs Blood pressure 123/90, pulse 60, temperature 98.6 F (37 C), resp. rate 16, height 5\' 1"  (1.549 m), weight 47.1 kg, SpO2 99 %. Constitutional: No distress . Vital signs reviewed. HEENT: EOMI, oral membranes moist Neck: supple Cardiovascular: RRR without murmur. No JVD    Respiratory: CTA Bilaterally without wheezes or rales. Normal effort    GI: BS +, non-tender, non-distended  Skin: Warm and dry.  Intact. Psych: pleasant Musc: No edema.  No tenderness. Neurological: Alert Right facial weakness Motor: RUE: 2/5 proximal to 1+/5 HI,grip RLE: Hip flexion, knee extension 3/5, ankle dorsiflexion remains tr to 0/5  Sensation intact light touch in all 4  Assessment/Plan: 1. 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  Physiatrist is providing close team supervision and 24 hour management of active medical problems listed below.  Physiatrist and rehab team continue to assess barriers to discharge/monitor patient progress toward functional and medical goals  Care  Tool:  Bathing  Bathing activity did not occur: Refused Body parts bathed by patient: Right arm, Chest, Abdomen, Right upper leg, Left upper leg, Face, Front perineal area, Buttocks   Body parts bathed by helper: Left arm, Right lower leg, Left lower leg     Bathing assist Assist Level: Minimal Assistance - Patient > 75%     Upper Body Dressing/Undressing Upper body dressing Upper body dressing/undressing activity did not occur (including orthotics): Safety/medical concerns What is the patient wearing?: Pull over shirt    Upper body assist Assist Level: Minimal Assistance - Patient > 75%    Lower Body Dressing/Undressing Lower body dressing      What is the patient wearing?: Underwear/pull up, Pants     Lower body assist Assist for lower body dressing: Minimal Assistance - Patient > 75%     Toileting Toileting Toileting Activity did not occur (Clothing management and hygiene only): N/A (no void or bm)  Toileting assist Assist for toileting: Supervision/Verbal cueing     Transfers Chair/bed transfer  Transfers assist     Chair/bed transfer assist level: Supervision/Verbal cueing     Locomotion Ambulation   Ambulation assist   Ambulation activity did not occur: Safety/medical concerns  Assist level: Supervision/Verbal cueing Assistive device: Walker-rolling Max distance: 150'   Walk 10 feet activity   Assist  Walk 10 feet activity did not occur: Safety/medical concerns  Assist level: Supervision/Verbal cueing Assistive device: Walker-rolling   Walk 50 feet activity   Assist Walk 50 feet with 2 turns activity did not occur: Safety/medical concerns  Assist level: Supervision/Verbal cueing Assistive device: Walker-rolling    Walk  150 feet activity   Assist Walk 150 feet activity did not occur: Safety/medical concerns  Assist level: Supervision/Verbal cueing Assistive device: Walker-rolling    Walk 10 feet on uneven surface   activity   Assist Walk 10 feet on uneven surfaces activity did not occur: Safety/medical concerns         Wheelchair     Assist Will patient use wheelchair at discharge?: Yes Type of Wheelchair: Manual    Wheelchair assist level: Supervision/Verbal cueing Max wheelchair distance: 150    Wheelchair 50 feet with 2 turns activity    Assist        Assist Level: Supervision/Verbal cueing   Wheelchair 150 feet activity     Assist  Wheelchair 150 feet activity did not occur: Safety/medical concerns   Assist Level: Supervision/Verbal cueing   Blood pressure 123/90, pulse 60, temperature 98.6 F (37 C), resp. rate 16, height 5\' 1"  (1.549 m), weight 47.1 kg, SpO2 99 %.   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 Pt required transfer to acute d/t seizures and heart block -Continue CIR therapies including PT, OT, and SLP  2. Antithrombotics: -DVT/anticoagulation:Mechanical:Sequential compression devices, below kneeBilateral lower extremitiesdue to low platelets. -antiplatelet therapy: DAPTwith ASA and plavix 3. Pain Management:tylenol prn. 4. Mood:LCSW to follow for evaluation and support. -antipsychotic agents: N/A 5. Neuropsych: This patientiscapable of making decisions on herown behalf. 6. Skin/Wound Care:groin incisions healing. 7. Fluids/Electrolytes/Nutrition:Monitor I/O.encourage PO  P.o. intake better so far with megace--continue to track 8. TAVR/CHB s/p PPM: On ASA/Plavix due to recent TAVR.   -metoprolol as below 9. New onset focal motor seizures: Continue Keppra bid X 3 months. Follow up with Dr. Felecia Shelling after discharge.  11. Loose stools:  Most likely cause is her tagrisso  -off senna   -stopped Fe+ supp  -continue probiotic and fiber  -hasn't moved bowels since 9/12. Hesitate to add anything  given her hx.   Encourage adequate po/liquids---follow for now 12. Acute on chronic anemia: no signs of bleeding.     Hemoglobin 8.8 on 9/14  -continue aranesp (9/11)  - CBC pending 13. Acute on chronic renal failure (CKD III): Hyperkalemia resolved--  -ReportsSCr1.9 one month PTA.  9/14 Cr 1.43 14. Chronic diastolic CHF: Monitor for signs of overload.   Continue Imdur, ASA and   Filed Weights   12/22/18 0507 12/23/18 0459 12/24/18 0545  Weight: 50.2 kg 48.5 kg 47.1 kg    Improving on 9/16 15. Metastatic lung cancer/chronic right pleural effusion: On Tagrisso  RPh has noted QTc monitoring is recommended weekly.   -will hold on EKG as never done and pt has been on this for some time  -pt has pacer 16. Acute on chronic thrombocytopenia: Baseline around 50-->38.     Platelets 57 on 9/14  Continue to monitor 17. Neutropenia: Has been in 2-3 range--monitor ANC for now.  WBC 2.7 on 9/14  Continue to monitor 18. Muscle cramps/spasms- 9/5- has Zanaflex 2 mg q8 hours prn-helpful but it may be exacerbating fluctations in bp  Stopped tizanidine d/t low bp's 19. HTN Vitals:   12/24/18 1917 12/25/18 0345  BP: (!) 138/46 123/90  Pulse: 61 60  Resp: 17 16  Temp: 98.6 F (37 C) 98.6 F (37 C)  SpO2: 99% 99%    - widened pulse pressure may be from valvular ht disease, stiff aorta   -plan is to treat SBP  -continue toprol XL 50mg  and imdur  30mg  daily   -? Consistency of readings.     LOS: 13 days A FACE TO FACE EVALUATION WAS PERFORMED  Meredith Staggers 12/25/2018, 10:16 AM

## 2018-12-25 NOTE — Progress Notes (Signed)
Occupational Therapy Session Note  Patient Details  Name: Stacy Cook MRN: [id number] Date of Birth: 1935/11/22  Today's Date: 12/25/2018 OT Individual Time: 1000-1045 OT Individual Time Calculation (min): 45 min    Short Term Goals: Week 2:  OT Short Term Goal 1 (Week 2): LTG=STG 2/2 ELOS  Skilled Therapeutic Interventions/Progress Updates:    Pt greeted seated in wc with sons present for family education. Discussed pt progress within OT treatment sessions today. Educated pt's sons on assisting pt to the bathroom. Pt ambulated with her son Merry Proud to the bathroom w/ RW and close supervision. Pt able to maintain balance while doffing pants and sitting on commode. Pt voided bladder and completed peri-care set-up. Pt then able to stand and pull up pants with close supervision. Pt returned to wc and brought to therapy apartment. Had pt's other son [name] practice ambulating in and out of bathroom and transferring onto tub bench. Pt able to complete task with close supervision and RW. Educated on falls risk and BADL modifications for BADL participation at home. R UE NMR with weight bearing shoulder slides across raised table. R Forearm pronation/supination, elbow flex/etx and hand flex/ext. Pt returned to room and left seated in wc with alarm belt on and sons present.  Therapy Documentation Precautions:  Precautions Precautions: Fall Precaution Comments: right hemiparesis Restrictions Weight Bearing Restrictions: No RUE Weight Bearing: Non weight bearing RLE Weight Bearing: Weight bearing as tolerated LLE Weight Bearing: Weight bearing as tolerated Pain: Denies pain  Therapy/Group: Individual Therapy  Valma Cava 12/25/2018, 10:30 AM

## 2018-12-25 NOTE — Progress Notes (Signed)
Occupational Therapy Session Note  Patient Details  Name: Stacy Cook MRN: [id number] Date of Birth: 26-Aug-1935  Today's Date: 12/25/2018 OT Individual Time: 4818-5909 OT Individual Time Calculation (min): 30 min    Short Term Goals: Week 2:  OT Short Term Goal 1 (Week 2): LTG=STG 2/2 ELOS  Skilled Therapeutic Interventions/Progress Updates:    Treatment session with focus on dynamic sitting and standing balance during self-care tasks.  Pt received supine in bed declining bathing/dressing this session.  Completed bed mobility and stand pivot transfer to w/c with supervision.  Pt donned shoes with setup assist and increased time when donning Rt shoe with AFO.  Pt reports need to toilet.  Ambulated to toilet with RW with CGA due to mild instability navigating threshold and reports "unsteady first thing in morning".  Pt able to complete clothing management and hygiene with supervision.  Engaged in oral care in standing at sink with cues to utilize RUE as stabilizer when opening toothpaste and then WB on sink when standing for NMR.  Pt reports anxious about family education with her sons as she doesn't think they know what to expect.  Pt remained upright in w/c with seat belt alarm on and all needs in reach.  Therapy Documentation Precautions:  Precautions Precautions: Fall Precaution Comments: right hemiparesis Restrictions Weight Bearing Restrictions: No  Pain:  Pt with no c/o pain   Therapy/Group: Individual Therapy  Simonne Come 12/25/2018, 10:12 AM

## 2018-12-26 ENCOUNTER — Inpatient Hospital Stay (HOSPITAL_COMMUNITY): Payer: Medicare Other | Admitting: Occupational Therapy

## 2018-12-26 ENCOUNTER — Inpatient Hospital Stay (HOSPITAL_COMMUNITY): Payer: Medicare Other

## 2018-12-26 ENCOUNTER — Inpatient Hospital Stay (HOSPITAL_COMMUNITY): Payer: Medicare Other | Admitting: Physical Therapy

## 2018-12-26 ENCOUNTER — Encounter (HOSPITAL_COMMUNITY): Payer: Self-pay | Admitting: *Deleted

## 2018-12-26 MED ORDER — METHOCARBAMOL 500 MG PO TABS: 500.0000 mg | ORAL_TABLET | Freq: Three times a day (TID) | ORAL | 0 refills | Status: DC | PRN

## 2018-12-26 MED ORDER — LEVETIRACETAM 500 MG PO TABS: 500.0000 mg | ORAL_TABLET | Freq: Two times a day (BID) | ORAL | 1 refills | Status: DC

## 2018-12-26 MED ORDER — MEGESTROL ACETATE 400 MG/10ML PO SUSP: 400.0000 mg | Freq: Two times a day (BID) | ORAL | 0 refills | Status: DC

## 2018-12-26 MED ORDER — SACCHAROMYCES BOULARDII 250 MG PO CAPS: 500.0000 mg | ORAL_CAPSULE | Freq: Two times a day (BID) | ORAL | 0 refills | Status: DC

## 2018-12-26 MED ORDER — CALCIUM POLYCARBOPHIL 625 MG PO TABS: 1250.0000 mg | ORAL_TABLET | Freq: Every day | ORAL | 0 refills | Status: DC

## 2018-12-26 MED ORDER — METOPROLOL SUCCINATE ER 50 MG PO TB24: 50.0000 mg | ORAL_TABLET | Freq: Every evening | ORAL | 0 refills | Status: DC

## 2018-12-26 MED ORDER — CLOPIDOGREL BISULFATE 75 MG PO TABS: 75.0000 mg | ORAL_TABLET | Freq: Every day | ORAL | 1 refills | Status: DC

## 2018-12-26 MED ORDER — TEMAZEPAM 7.5 MG PO CAPS: 7.5000 mg | ORAL_CAPSULE | Freq: Every evening | ORAL | 0 refills | Status: DC | PRN

## 2018-12-26 NOTE — Discharge Summary (Signed)
Physician Discharge Summary  Patient ID: Stacy Cook MRN: [id number] DOB/AGE: [birth year] 81 y.o.  Admit date: 12/12/2018 Discharge date: 12/27/2018  Discharge Diagnoses:  Principal Problem:   Embolic stroke [facility]) Active Problems:   Thrombocytopenia (HCC)   Type 2 diabetes mellitus ([place])   Primary malignant neoplasm of right lower lobe of lung (HCC)   Anemia of chronic disease   Right hemiparesis (HCC)   Muscle spasm of right lower extremity   Neutropenia (HCC)   Acute on chronic anemia   Chronic diastolic congestive heart failure (HCC)   Widened pulse pressure   Discharged Condition:  Stable   Significant Diagnostic Studies: N/A    Labs:  Basic Metabolic Panel: BMP Latest Ref Rng & Units 12/22/2018 12/18/2018 12/15/2018  Glucose 70 - 99 mg/dL 117(H) 92 106(H)  BUN 8 - 23 mg/dL 31(H) 31(H) 31(H)  Creatinine 0.44 - 1.00 mg/dL 1.43(H) 1.55(H) 1.46(H)  BUN/Creat Ratio 12 - 28 - - -  Sodium 135 - 145 mmol/L 140 139 141  Potassium 3.5 - 5.1 mmol/L 3.8 3.9 4.2  Chloride 98 - 111 mmol/L 109 109 110  CO2 22 - 32 mmol/L 24 23 23   Calcium 8.9 - 10.3 mg/dL 8.6(L) 8.6(L) 8.9    CBC: CBC Latest Ref Rng & Units 12/25/2018 12/22/2018 12/19/2018  WBC 4.0 - 10.5 K/uL 3.4(L) 2.7(L) 2.8(L)  Hemoglobin 12.0 - 15.0 g/dL 10.3(L) 8.8(L) 7.4(L)  Hematocrit 36.0 - 46.0 % 30.4(L) 26.4(L) 22.2(L)  Platelets 150 - 400 K/uL 60(L) 57(L) 53(L)    CBG: Recent Labs  Lab 12/20/18 1232 12/20/18 1633 12/20/18 2051 12/21/18 0619 12/24/18 2117  GLUCAP 94 100* 92 85 114*    Iron/TIBC/Ferritin/ %Sat    Component Value Date/Time   IRON 48 12/19/2018 1048   IRON 86 05/29/2012 1519   TIBC 207 (L) 12/19/2018 1048   TIBC 351 05/29/2012 1519   FERRITIN 358 (H) 12/19/2018 1048   FERRITIN 28 05/29/2012 1518   IRONPCTSAT 23 12/19/2018 1048   IRONPCTSAT 25 05/29/2012 1519     Brief HPI:   Stacy Cook is a 83 y.o. RH-female with history of HTN, lung cancer with mets, recurrent RLL pleural  effusion, CAS, carcinoid tumor of stomach, severe CAD s/p PCI, progressive S OB with fatigue due to aortic stenosis and was originally admitted on 12/02/2022 TVAR by Dr. Cyndia Bent and Dr. Angelena Form.  Postop course significant for left pontine and left frontal lobe infarcts felt to be due to embolism and DAPT continued.  She had issues with acute on chronic anemia treated with 1 unit packed red blood cells and was admitted to [facility] for intensive rehab program on 12/05/2018.  Hospital course significant for issues with syncope that initially was felt to be vasovagal in nature.  She was treated with IV fluids for acute on chronic renal failure and Zio patch was placed to monitor for any signs of arrhythmia or heart block.  She did develop focal motor seizures requiring addition of Keppra.  EEG done showing single sharp transient spike in left frontotemporal region and neurology recommended continuing Keppra for at least 3 months duration.  She was also found to have multiple episodes of 10 to 13-second pauses on 12/09/2018 and was transferred to acute for emergent PPM placement.  Postprocedure had some issues with confusion disorientation which would have resolved.  She was also noted to have acute on chronic renal failure as well as acute on chronic pancytopenia which was being monitored.  She was cleared to resume her  rehab course on 12/12/18    Hospital Course: Stacy Cook was admitted to rehab 12/12/2018 for inpatient therapies to consist of PT and OT at least three hours five days a week. Past admission physiatrist, therapy team and rehab RN have worked together to provide customized collaborative inpatient rehab. Blood pressures were monitored on TID basis and has been stable on Toprol XL and imdur. cardiology consulted for input on widened pulse pressure and reported that this should improve with recent TVAR. Repeat echo scheduled for follow up in a couple of weeks. Spasticity is improving and tizanidine was  discontinued due to concerns of hypotensive SE.  She has been seizure free on Keppra and has been advised not to drive for 6 months or till cleared by neurology.    She has had issues with loose stools question due to SE of tagrisso.  Iron supplement was discontinued and fiber as well as probiotic was added to help with bulking. Acute on chronic anemia without improvement therefore she received 2 doses of aranesp--follow up H/H has improved to 10.3/30.4.  Leucopenia and thrombocytopenia are improving without fevers or signs of bleeding noted.  Megace was added to help stimulate appetite during his stay and nutritional supplements have also been offered on PRN basis.  She reports that she eats multiple small meals at home and will continue to resume this after discharge.   She is continent of bowel and bladder.  She has made gains during rehab stay and is currently at supervision level. She will continue to receive follow up [place], [place] [facility] [place] [facility] after discharge.    Rehab course: During patient's stay in rehab weekly team conferences were held to monitor patient's progress, set goals and discuss barriers to discharge. At admission, patient required min to mod assist for ADLs and min assist with mobility. She  has had improvement in activity tolerance, balance, postural control as well as ability to compensate for deficits. She has had improvement in functional use RUE  and RLE as well as improvement in awareness. She requires supervision for transfers and to ambulate 150' with use of RW. She is able to climb 4 stairs with CGA. Family education was completed regarding all aspects of care and safety.    Disposition: Home  Diet: Heart healthy.   Special Instructions: 1. No driving till cleared by neurology. 2. Needs supervision with mobility.  3. Weight today and daily.   Discharge Instructions    Ambulatory referral to Neurology   Complete by: As directed    An appointment is  requested in approximately: 2-3 weeks follow up on stroke   Ambulatory referral to Physical Medicine Rehab   Complete by: As directed    1-2 weeks transitional care appt     Allergies as of 12/27/2018      Reactions   Mirtazapine Other (See Comments)   Sluggish    Pneumococcal Vaccine Itching, Other (See Comments)   Hives and fever   Statins Other (See Comments)   Joint pain   Sulfa Antibiotics Nausea And Vomiting      Medication List    TAKE these medications   aspirin EC 81 MG tablet Take 81 mg by mouth daily.   cholecalciferol 1000 units tablet Commonly known as: VITAMIN D Take 1,000 Units by mouth daily.   clopidogrel 75 MG tablet Commonly known as: PLAVIX Take 1 tablet (75 mg total) by mouth daily.   cyanocobalamin 1000 MCG tablet Take 1,000 mcg by mouth  daily.   ferrous sulfate 325 (65 FE) MG tablet Take 325 mg by mouth daily with breakfast.   isosorbide mononitrate 30 MG 24 hr tablet Commonly known as: IMDUR Take 1 tablet (30 mg total) by mouth daily.   levETIRAcetam 500 MG tablet Commonly known as: KEPPRA Take 1 tablet (500 mg total) by mouth 2 (two) times daily.   megestrol 400 MG/10ML suspension Commonly known as: MEGACE Take 10 mLs (400 mg total) by mouth 2 (two) times daily. Notes to patient: To help with appetite   methocarbamol 500 MG tablet Commonly known as: ROBAXIN Take 1 tablet (500 mg total) by mouth every 8 (eight) hours as needed for muscle spasms.   metoprolol succinate 50 MG 24 hr tablet Commonly known as: TOPROL-XL Take 1 tablet (50 mg total) by mouth every evening. Take with or immediately following a meal.   nitroGLYCERIN 0.4 MG SL tablet Commonly known as: NITROSTAT Place 0.4 mg under the tongue every 5 (five) minutes as needed for chest pain.   osimertinib mesylate 80 MG tablet Commonly known as: Tagrisso Take 1 tablet (80 mg total) by mouth daily. What changed: when to take this   pantoprazole 40 MG tablet Commonly known  as: PROTONIX Take 40 mg by mouth daily.   polycarbophil 625 MG tablet Commonly known as: FIBERCON Take 2 tablets (1,250 mg total) by mouth daily.   saccharomyces boulardii 250 MG capsule Commonly known as: FLORASTOR Take 2 capsules (500 mg total) by mouth 2 (two) times daily. Notes to patient: Probiotic--to help with bulking   temazepam 7.5 MG capsule--Rx # 15 pills Commonly known as: RESTORIL Take 1 capsule (7.5 mg total) by mouth at bedtime as needed for sleep.   triamcinolone ointment 0.5 % Commonly known as: KENALOG Apply 1 application topically 2 (two) times daily.       Follow-up Information    Meredith Staggers, MD Follow up.   Specialty: Physical Medicine and Rehabilitation Why: Office will call you with follow up appointment Contact information: [address] [place] [address] [phone]        Adin Hector, MD. Call.   Specialty: Internal Medicine Why: For post hospital follow up Contact information: [place] [address] [phone]        Gaye Pollack, MD. Call on 12/29/2018.   Specialty: Cardiothoracic Surgery Why: for post op appointment Contact information: [address] [place] [address] [phone]        Eileen Stanford, PA-C Follow up on 01/15/2019.   Specialties: Cardiology, Radiology Why: Appointment at  2 pm Contact information: [place] [address] [phone]        [place] Follow up.   Why: Office will call you with follow up appointment/follow up seizures Contact information: [place] [address] [phone]          Signed: Bary Leriche 12/30/2018, 11:33 AM

## 2018-12-26 NOTE — Progress Notes (Deleted)
Occupational Therapy Discharge Summary  Patient Details  Name: Stacy Cook MRN: [id number] Date of Birth: 13-Apr-1935  Today's Date: 12/26/2018 OT Individual Time: 1100-1200 OT Individual Time Calculation (min): 60 min   Pt greeted seated in wc and agreeable to OT treatment session focused on increased independence with BADL tasks. Pt doffed clothing from wc, ambulated to bathroom and into shower with supervision. Bathing with supervision and guided A for R UE neuro re-ed within bathing tasks. Dressing tasks completed with supervision and OT instruction for one handed technique to don socks. Discussed dc plan, BADL modifications, home safety, falls risk, and energy conservation techniques. OT issued Dysom and educated on uses within cooking, self-feed, and other BADL/iADL tasks. Pt left seated in wc at end of session with chair alarm on and needs met.    Patient has met 12 of 12 long term goals due to improved activity tolerance, improved balance, postural control, ability to compensate for deficits, functional use of  RIGHT upper and RIGHT lower extremity, improved awareness and improved coordination.  Patient to discharge at overall Supervision level.  Patient's care partner is independent to provide the necessary physical assistance at discharge.    Reasons goals not met: n/a   Recommendation:  Patient will benefit from ongoing skilled OT services in home health setting to continue to advance functional skills in the area of BADL and functional use of R UE.  Equipment: tub transfer bench, 3-in-1 BSC, RW, WC  Reasons for discharge: treatment goals met and discharge from hospital  Patient/family agrees with progress made and goals achieved: Yes  OT Discharge Precautions/Restrictions  Precautions Precautions: Fall Restrictions RLE Weight Bearing: Weight bearing as tolerated LLE Weight Bearing: Weight bearing as tolerated Pain  denies pain ADL ADL Eating: Set up Grooming:  Modified independent Where Assessed-Grooming: Wheelchair, Sitting at sink Upper Body Bathing: Supervision/safety Lower Body Bathing: Supervision/safety Upper Body Dressing: Supervision/safety Where Assessed-Upper Body Dressing: Wheelchair Lower Body Dressing: Supervision/safety Where Assessed-Lower Body Dressing: Wheelchair, Standing at sink, Sitting at sink Toileting: Supervision/safety Where Assessed-Toileting: Glass blower/designer: Close supervision Tub/Shower Transfer: Close supervison Perception  Perception: Within Functional Limits Praxis Praxis: Intact Cognition Overall Cognitive Status: Within Functional Limits for tasks assessed Orientation Level: Oriented X4 Safety/Judgment: Appears intact Sensation Coordination Gross Motor Movements are Fluid and Coordinated: No Fine Motor Movements are Fluid and Coordinated: No Coordination and Movement Description: R side impaired 2/2 Hemiplegia Motor  Motor Motor: Hemiplegia Motor - Discharge Observations: R hemi, UE>LE Mobility  Transfers Sit to Stand: Supervision/Verbal cueing Stand to Sit: Supervision/Verbal cueing  Balance Static Sitting Balance Static Sitting - Balance Support: Feet supported;No upper extremity supported Static Sitting - Level of Assistance: 7: Independent Dynamic Sitting Balance Dynamic Sitting - Balance Support: Feet supported;During functional activity;No upper extremity supported Dynamic Sitting - Level of Assistance: 7: Independent Static Standing Balance Static Standing - Balance Support: During functional activity Static Standing - Level of Assistance: 5: Stand by assistance Dynamic Standing Balance Dynamic Standing - Balance Support: During functional activity Dynamic Standing - Level of Assistance: 5: Stand by assistance Extremity/Trunk Assessment RUE Assessment RUE Assessment: Exceptions to [facility] RUE Body System: Neuro Brunstrum levels for arm and hand: Arm;Hand Brunstrum level for arm:  Stage IV Movement is deviating from synergy Brunstrum level for hand: Stage IV Movements deviating from synergies LUE Assessment LUE Assessment: Within Functional Limits   Stacy Cook Stacy Cook 12/26/2018, 12:26 PM

## 2018-12-26 NOTE — Progress Notes (Signed)
  Occupational Therapy Discharge Summary  Patient Details  Name: Maryl Blalock MRN: [id number] Date of Birth: [birth year]  Today's Date: 12/26/2018 OT Individual Time: 1100-1200 OT Individual Time Calculation (min): 60 min   Pt greeted seated in wc and agreeable to OT treatment session focused on increased independence with BADL tasks. Pt doffed clothing from wc, ambulated to bathroom and into shower with supervision. Bathing with supervision and guided A for R UE neuro re-ed within bathing tasks. Dressing tasks completed with supervision and OT instruction for one handed technique to don socks. Discussed dc plan, BADL modifications, home safety, falls risk, and energy conservation techniques. OT issued Dysom and educated on uses within cooking, self-feed, and other BADL/iADL tasks. Pt left seated in wc at end of session with chair alarm on and needs met.    Patient has met 12 of 12 long term goals due to improved activity tolerance, improved balance, postural control, ability to compensate for deficits, functional use of  RIGHT upper and RIGHT lower extremity, improved awareness and improved coordination.  Patient to discharge at overall Supervision level.  Patient's care partner is independent to provide the necessary physical assistance at discharge.    Reasons goals not met: n/a   Recommendation:  Patient will benefit from ongoing skilled OT services in home health setting to continue to advance functional skills in the area of BADL and functional use of R UE.  Equipment: tub transfer bench, 3-in-1 BSC, RW, WC  Reasons for discharge: treatment goals met and discharge from hospital  Patient/family agrees with progress made and goals achieved: Yes  OT Discharge Precautions/Restrictions  Precautions Precautions: Fall Restrictions RLE Weight Bearing: Weight bearing as tolerated LLE Weight Bearing: Weight bearing as tolerated Pain  denies pain ADL ADL Eating: Set  up Grooming: Modified independent Where Assessed-Grooming: Wheelchair, Sitting at sink Upper Body Bathing: Supervision/safety Lower Body Bathing: Supervision/safety Upper Body Dressing: Supervision/safety Where Assessed-Upper Body Dressing: Wheelchair Lower Body Dressing: Supervision/safety Where Assessed-Lower Body Dressing: Wheelchair, Standing at sink, Sitting at sink Toileting: Supervision/safety Where Assessed-Toileting: Glass blower/designer: Close supervision Tub/Shower Transfer: Close supervison Perception  Perception: Within Functional Limits Praxis Praxis: Intact Cognition Overall Cognitive Status: Within Functional Limits for tasks assessed Orientation Level: Oriented X4 Safety/Judgment: Appears intact Sensation Coordination Gross Motor Movements are Fluid and Coordinated: No Fine Motor Movements are Fluid and Coordinated: No Coordination and Movement Description: R side impaired 2/2 Hemiplegia Motor  Motor Motor: Hemiplegia Motor - Discharge Observations: R hemi, UE>LE Mobility  Transfers Sit to Stand: Supervision/Verbal cueing Stand to Sit: Supervision/Verbal cueing  Balance Static Sitting Balance Static Sitting - Balance Support: Feet supported;No upper extremity supported Static Sitting - Level of Assistance: 7: Independent Dynamic Sitting Balance Dynamic Sitting - Balance Support: Feet supported;During functional activity;No upper extremity supported Dynamic Sitting - Level of Assistance: 7: Independent Static Standing Balance Static Standing - Balance Support: During functional activity Static Standing - Level of Assistance: 5: Stand by assistance Dynamic Standing Balance Dynamic Standing - Balance Support: During functional activity Dynamic Standing - Level of Assistance: 5: Stand by assistance Extremity/Trunk Assessment RUE Assessment RUE Assessment: Exceptions to [facility] RUE Body System: Neuro Brunstrum levels for arm and hand: Arm;Hand Brunstrum  level for arm: Stage IV Movement is deviating from synergy Brunstrum level for hand: Stage IV Movements deviating from synergies LUE Assessment LUE Assessment: Within Functional Limits   Daneen Schick Darneisha Windhorst 12/26/2018, 3:45 PM

## 2018-12-26 NOTE — Progress Notes (Addendum)
[facility] PHYSICAL MEDICINE & REHABILITATION PROGRESS NOTE   Subjective/Complaints: Still some fatigue but overall feeling well. Family ed went well yesterday which was reassuring for her  ROS: Patient denies fever, rash, sore throat, blurred vision, nausea, vomiting, diarrhea, cough, shortness of breath or chest pain, joint or back pain, headache, or mood change.   Objective:   No results found. Recent Labs    12/25/18 0926  WBC 3.4*  HGB 10.3*  HCT 30.4*  PLT 60*   No results for input(s): NA, K, CL, CO2, GLUCOSE, BUN, CREATININE, CALCIUM in the last 72 hours.  Intake/Output Summary (Last 24 hours) at 12/26/2018 0947 Last data filed at 12/25/2018 1820 Gross per 24 hour  Intake 440 ml  Output -  Net 440 ml     Physical Exam: Vital Signs Blood pressure (!) 118/38, pulse (!) 59, temperature 98 F (36.7 C), temperature source Oral, resp. rate 15, height 5\' 1"  (1.549 m), weight 47.1 kg, SpO2 98 %. Constitutional: No distress . Vital signs reviewed. HEENT: EOMI, oral membranes moist Neck: supple Cardiovascular: brady with murmur. No JVD    Respiratory: CTA Bilaterally without wheezes or rales. Normal effort    GI: BS +, non-tender, non-distended  Skin: Warm and dry.  Intact. Psych: pleasant Musc: No edema.  No tenderness. Neurological: Alert Right facial weakness Motor: RUE: 2/5 proximal to 1+/5 HI,grip RLE: Hip flexion, knee extension 3/5, ankle dorsiflexion remains tr to 0/5--motor exam essentially unchanged Sensation intact light touch in all 4  Assessment/Plan: 1. 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  Physiatrist is providing close team supervision and 24 hour management of active medical problems listed below.  Physiatrist and rehab team continue to assess barriers to discharge/monitor  patient progress toward functional and medical goals  Care Tool:  Bathing  Bathing activity did not occur: Refused Body parts bathed by patient: Right arm, Chest, Abdomen, Right upper leg, Left upper leg, Face, Front perineal area, Buttocks   Body parts bathed by helper: Left arm, Right lower leg, Left lower leg     Bathing assist Assist Level: Minimal Assistance - Patient > 75%     Upper Body Dressing/Undressing Upper body dressing Upper body dressing/undressing activity did not occur (including orthotics): Safety/medical concerns What is the patient wearing?: Pull over shirt    Upper body assist Assist Level: Minimal Assistance - Patient > 75%    Lower Body Dressing/Undressing Lower body dressing      What is the patient wearing?: Underwear/pull up, Pants     Lower body assist Assist for lower body dressing: Minimal Assistance - Patient > 75%     Toileting Toileting Toileting Activity did not occur (Clothing management and hygiene only): N/A (no void or bm)  Toileting assist Assist for toileting: Supervision/Verbal cueing     Transfers Chair/bed transfer  Transfers assist     Chair/bed transfer assist level: Supervision/Verbal cueing     Locomotion Ambulation   Ambulation assist   Ambulation activity did not occur: Safety/medical concerns  Assist level: Supervision/Verbal cueing Assistive device: Walker-rolling Max distance: 150   Walk 10 feet activity   Assist  Walk 10 feet activity did not occur: Safety/medical concerns  Assist level: Supervision/Verbal cueing Assistive device: Walker-rolling   Walk 50 feet activity   Assist Walk 50 feet with 2 turns activity did not occur: Safety/medical concerns  Assist level: Supervision/Verbal cueing Assistive device: Walker-rolling    Walk 150 feet activity   Assist Walk 150 feet activity did not occur: Safety/medical concerns  Assist level: Supervision/Verbal cueing Assistive device:  Walker-rolling    Walk 10 feet on uneven surface  activity   Assist Walk 10 feet on uneven surfaces activity did not occur: Safety/medical concerns         Wheelchair     Assist Will patient use wheelchair at discharge?: Yes Type of Wheelchair: Manual    Wheelchair assist level: Supervision/Verbal cueing Max wheelchair distance: 150'    Wheelchair 50 feet with 2 turns activity    Assist        Assist Level: Supervision/Verbal cueing   Wheelchair 150 feet activity     Assist  Wheelchair 150 feet activity did not occur: Safety/medical concerns   Assist Level: Supervision/Verbal cueing   Blood pressure (!) 118/38, pulse (!) 59, temperature 98 F (36.7 C), temperature source Oral, resp. rate 15, height 5\' 1"  (1.549 m), weight 47.1 kg, SpO2 98 %.   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 Pt required transfer to acute d/t seizures and heart block -Continue CIR therapies including PT, OT, and SLP   -ELOS 9/19  -Patient to see Rehab MD/provider in the office for transitional care encounter in 1-2 weeks.  2. Antithrombotics: -DVT/anticoagulation:Mechanical:Sequential compression devices, below kneeBilateral lower extremitiesdue to low platelets. -antiplatelet therapy: DAPTwith ASA and plavix 3. Pain Management:tylenol prn. 4. Mood:LCSW to follow for evaluation and support. -antipsychotic agents: N/A 5. Neuropsych: This patientiscapable of making decisions on herown behalf. 6. Skin/Wound Care:groin incisions healing. 7. Fluids/Electrolytes/Nutrition:Monitor I/O.encourage PO  P.o. intake better   with megace--continue to follow 8. TAVR/CHB s/p PPM: On ASA/Plavix due to recent TAVR.   -metoprolol as below  -outpt cards/cvts follow up 9. New onset focal motor seizures: Continue Keppra bid X 3 months.  Follow up with Dr. Felecia Shelling after discharge.  11. Loose stools:  Most likely cause is her tagrisso  -off senna   -stopped Fe+ supp  -continue probiotic and fiber  -hasn't moved bowels since 9/12. Hesitate to add anything given her hx of diarrhea---will review plan with RN today. Perhaps some prune juice  Encourage adequate po/liquids---follow for now 12. Acute on chronic anemia: no signs of bleeding.     Hemoglobin up to 10.3 9/17!!  -repeat aranesp (9/11) today. Will not send home on this however    13. Acute on chronic renal failure (CKD III): Hyperkalemia resolved--  -ReportsSCr1.9 one month PTA.  9/14 Cr 1.43---at or better than baseline 14. Chronic diastolic CHF: Monitor for signs of overload.   Continue Imdur, ASA and   Filed Weights   12/22/18 0507 12/23/18 0459 12/24/18 0545  Weight: 50.2 kg 48.5 kg 47.1 kg    Stable 9/18 15. Metastatic lung cancer/chronic right pleural effusion: On Tagrisso  RPh has noted QTc monitoring is recommended weekly.   -will hold on EKG as never done and pt has been on this for some time  -pt has pacer 16. Acute on chronic thrombocytopenia: Baseline around 50-->38.     Platelets 60 on 9/17  Continue to monitor 17. Neutropenia: Has been in 2-3 range--monitor ANC for now.  WBC 3.4 9/17  Continue to monitor 18. Muscle cramps/spasms- 9/5- has Zanaflex 2 mg q8 hours prn-helpful but it may be exacerbating fluctations in bp  Stopped tizanidine d/t low bp's 19. HTN Vitals:  12/25/18 1945 12/26/18 0355  BP: (!) 124/38 (!) 118/38  Pulse: 68 (!) 59  Resp: 17 15  Temp: 98.2 F (36.8 C) 98 F (36.7 C)  SpO2: 100% 98%    - widened pulse pressure may be from valvular ht disease, stiff aorta   -plan is to treat SBP  -continue toprol XL 50mg  and imdur 30mg  daily   -pt is asymptomatic  -outpt cards follow up.     LOS: 14 days A FACE TO FACE EVALUATION WAS PERFORMED  Meredith Staggers 12/26/2018, 9:47 AM

## 2018-12-26 NOTE — Discharge Instructions (Signed)
Inpatient Rehab Discharge Instructions  Stacy Cook Discharge date and time:  12/26/18   Activities/Precautions/ Functional Status: Activity: no lifting, driving, or strenuous exercise till cleared by MD Diet: cardiac diet Wound Care: Keep wound clean and dry. Contact MD if you develop any problems with your incision/wound--redness, swelling, increase in pain, drainage or if you develop fever or chills.   Functional status:  ___ No restrictions     ___ Walk up steps independently _X__ 24/7 supervision/assistance   ___ Walk up steps with assistance ___ Intermittent supervision/assistance  ___ Bathe/dress independently ___ Walk with walker     _X__ Bathe/dress with assistance ___ Walk Independently    ___ Shower independently ___ Walk with assistance    ___ Shower with assistance _X__ No alcohol     ___ Return to work/school ________    COMMUNITY REFERRALS UPON DISCHARGE:    Home Health:   PT     OT     RN                      Agency:  [facility]    Phone: [phone]   Medical Equipment/Items Ordered:  Wheelchair, cushion, walker, commode and tub bench                                                         Agency/Supplier:  [facility] @ [phone]  Special Instructions: 1. Weigh yourself daily.  2. Try to eat small snacks between meals.    My questions have been answered and I understand these instructions. I will adhere to these goals and the provided educational materials after my discharge from the hospital.  Patient/Caregiver Signature _______________________________ Date __________  Clinician Signature _______________________________________ Date __________  Please bring this form and your medication list with you to all your follow-up doctor's appointments.

## 2018-12-26 NOTE — Progress Notes (Signed)
Occupational Therapy Session Note  Patient Details  Name: Stacy Cook MRN: [id number] Date of Birth: 1936-03-28  Today's Date: 12/26/2018 OT Individual Time: 1415-1500 OT Individual Time Calculation (min): 45 min    Short Term Goals: Week 2:  OT Short Term Goal 1 (Week 2): LTG=STG 2/2 ELOS  Skilled Therapeutic Interventions/Progress Updates:    Treatment session with focus on RUE NMR and problem solving techniques to increase independence with completing familiar tasks.  Pt received upright in w/c agreeable to therapy session.  Provided pt with self-ROM HEP.  Pt educated on each exercise and demonstrated ability to complete each with min cues to ensure proper technique.  Encouraged pt to continue to utilize RUE as gross assist to diminished level to continue to promote NMR and motor recovery.  Discussed modifications of tasks and setup of tasks to increase success.  Pt reports need to toilet.  Ambulated to toilet with RW and completed toileting all with Supervision.  Returned to bed and pt doffed shoes and AFO without assistance.  Pt left upright in bed to allow for proper positioning to engage in eating snack.  Therapy Documentation Precautions:  Precautions Precautions: Fall Precaution Comments: right hemiparesis Restrictions Weight Bearing Restrictions: No RUE Weight Bearing: Non weight bearing RLE Weight Bearing: Weight bearing as tolerated LLE Weight Bearing: Weight bearing as tolerated General:   Vital Signs: Therapy Vitals Temp: 97.7 F (36.5 C) Temp Source: Oral Pulse Rate: (!) 54 Resp: 19 BP: (!) 103/36 Patient Position (if appropriate): Sitting Oxygen Therapy SpO2: 99 % O2 Device: Room Air Pain:  Pt with no c/o pain   Therapy/Group: Individual Therapy  Simonne Come 12/26/2018, 3:15 PM

## 2018-12-26 NOTE — Progress Notes (Signed)
Physical Therapy Session Note  Patient Details  Name: Stacy Cook MRN: [id number] Date of Birth: January 13, 1936  Today's Date: 12/26/2018 PT Individual Time: 0900-0930 PT Individual Time Calculation (min): 30 min   Short Term Goals: Week 1:  PT Short Term Goal 1 (Week 1): Pt will initiate stair training PT Short Term Goal 1 - Progress (Week 1): Met PT Short Term Goal 2 (Week 1): Pt will perform transfers w/ CGA PT Short Term Goal 2 - Progress (Week 1): Met PT Short Term Goal 3 (Week 1): Pt will tolerate 60 min of upright activity w/o increase in fatigue PT Short Term Goal 3 - Progress (Week 1): Progressing toward goal PT Short Term Goal 4 (Week 1): Pt will ambulate 50' w/ min assist using LRAD PT Short Term Goal 4 - Progress (Week 1): Met Week 2:  PT Short Term Goal 1 (Week 2): =LTGs due to ELOS  Skilled Therapeutic Interventions/Progress Updates:    Pt received supine in bed. Today's session focused on improving activity tolerance/endurance with ADL's as well as coordination, balance, and obstacle navigation in preparation for D/C home and into the community. Pt transferred supine<> sitting EOB supervision with good postural control and sit<>stand with RW supervision with minor verbal cues to regain balance prior to walking. Pt completed an obstacle course with the RW involving ambulating while weaving in and out of cones and navigating one step with CGA (and mod assist to navigate the step). Pt required verbal cueing for RW and foot placement while completing the step. Pt completed 2 trials with a 2 minute rest break in between trials. Pt tolerated today's session well.   Therapy Documentation Precautions:  Precautions Precautions: Fall Precaution Comments: right hemiparesis Restrictions Weight Bearing Restrictions: No RUE Weight Bearing: Non weight bearing RLE Weight Bearing: Weight bearing as tolerated LLE Weight Bearing: Weight bearing as tolerated General:    Pain:  Pt did  not report any pain during today's session.     Therapy/Group: Individual Therapy  Alfonse Alpers PT, DPT    Lars Masson, PT, DPT, CBIS 12:32 PM    12/26/2018, 12:18 PM

## 2018-12-26 NOTE — Progress Notes (Signed)
Social Work Discharge Note   The overall goal for the admission was met for:   Discharge location: Yes - home with sons providing 24/7 assistance  Length of Stay: Yes - 15 days  Discharge activity level: Yes - supervision  Home/community participation: Yes  Services provided included: MD, RD, PT, OT, RN, TR, Pharmacy, [place]: Medicare  Follow-up services arranged: Home Health: RN, PT, OT via [facility], DME: 16x16 lightweight wheelchair, cushio, rolling walker, 3n1 commode and tub bench via [facility] and Patient/Family has no preference for HH/DME agencies  Comments (or additional information):     Contact info:: pt @ [phone]       Darcie, [place] @ [phone]  Patient/Family verbalized understanding of follow-up arrangements: Yes  Individual responsible for coordination of the follow-up plan: pt  Confirmed correct DME delivered: Lennart Pall 12/26/2018    Ina Poupard

## 2018-12-27 NOTE — Progress Notes (Signed)
Patient discharged to home per wheelchair accompanied by NT; son is waiting at the lobby; home meds signed and given to patient.

## 2018-12-27 NOTE — Progress Notes (Signed)
[facility] PHYSICAL MEDICINE & REHABILITATION PROGRESS NOTE   Subjective/Complaints: Patient seen sitting up in bed this morning.  She states she slept well overnight.  She is ready for discharge today.  ROS: Denies CP, SOB, N/V/D  Objective:   No results found. Recent Labs    12/25/18 0926  WBC 3.4*  HGB 10.3*  HCT 30.4*  PLT 60*   No results for input(s): NA, K, CL, CO2, GLUCOSE, BUN, CREATININE, CALCIUM in the last 72 hours.  Intake/Output Summary (Last 24 hours) at 12/27/2018 1548 Last data filed at 12/27/2018 0840 Gross per 24 hour  Intake 440 ml  Output -  Net 440 ml     Physical Exam: Vital Signs Blood pressure (!) 112/32, pulse (!) 50, temperature 98.3 F (36.8 C), temperature source Oral, resp. rate 16, height 5\' 1"  (1.549 m), weight 49 kg, SpO2 98 %. Constitutional: No distress . Vital signs reviewed. HENT: Normocephalic.  Atraumatic. Eyes: EOMI. No discharge. Cardiovascular: No JVD. Respiratory: Normal effort.  No stridor. GI: Non-distended. Skin: Warm and dry.  Intact. Psych: Normal mood.  Normal behavior. Musc: No edema in extremities.  No tenderness in extremities. Neurological: Alert Right facial weakness Motor: RUE: 2/5 proximal to 1+/5 handgrip, unchanged RLE: Hip flexion, knee extension 3/5, ankle dorsiflexion remains tr to 0/5, stable  Assessment/Plan: 1. 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  Physiatrist is providing close team supervision and 24 hour management of active medical problems listed below.  Physiatrist and rehab team continue to assess barriers to discharge/monitor patient progress toward functional and medical goals  Care Tool:  Bathing  Bathing activity did not occur: Refused Body parts bathed by patient: Right arm, Abdomen, Right upper leg, Left upper  leg, Face, Front perineal area, Buttocks, Left lower leg, Right lower leg, Left arm, Chest   Body parts bathed by helper: Left arm, Right lower leg, Left lower leg     Bathing assist Assist Level: Supervision/Verbal cueing     Upper Body Dressing/Undressing Upper body dressing Upper body dressing/undressing activity did not occur (including orthotics): Safety/medical concerns What is the patient wearing?: Pull over shirt    Upper body assist Assist Level: Independent with assistive device    Lower Body Dressing/Undressing Lower body dressing      What is the patient wearing?: Underwear/pull up, Pants     Lower body assist Assist for lower body dressing: Supervision/Verbal cueing     Toileting Toileting Toileting Activity did not occur (Clothing management and hygiene only): N/A (no void or bm)  Toileting assist Assist for toileting: Supervision/Verbal cueing     Transfers Chair/bed transfer  Transfers assist     Chair/bed transfer assist level: Supervision/Verbal cueing     Locomotion Ambulation   Ambulation assist   Ambulation activity did not occur: Safety/medical concerns  Assist level: Supervision/Verbal cueing Assistive device: Walker-rolling Max distance: 150'   Walk 10 feet activity   Assist  Walk 10 feet activity did not occur: Safety/medical concerns  Assist level: Supervision/Verbal cueing Assistive device: Walker-rolling   Walk 50 feet activity   Assist Walk 50 feet with 2 turns activity did not occur: Safety/medical concerns  Assist level: Supervision/Verbal cueing Assistive device: Walker-rolling    Walk 150 feet activity   Assist Walk 150 feet activity did not occur: Safety/medical concerns  Assist level: Supervision/Verbal cueing Assistive device: Walker-rolling  Walk 10 feet on uneven surface  activity   Assist Walk 10 feet on uneven surfaces activity did not occur: Safety/medical concerns   Assist level:  Supervision/Verbal cueing Assistive device: Aeronautical engineer Will patient use wheelchair at discharge?: Yes Type of Wheelchair: Manual    Wheelchair assist level: Supervision/Verbal cueing Max wheelchair distance: 150'    Wheelchair 50 feet with 2 turns activity    Assist        Assist Level: Supervision/Verbal cueing   Wheelchair 150 feet activity     Assist  Wheelchair 150 feet activity did not occur: Safety/medical concerns   Assist Level: Supervision/Verbal cueing   Blood pressure (!) 112/32, pulse (!) 50, temperature 98.3 F (36.8 C), temperature source Oral, resp. rate 16, height 5\' 1"  (1.549 m), weight 49 kg, SpO2 98 %.   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 Pt required transfer to acute d/t seizures and heart block DC today  -Patient to see Rehab MD/provider in the office for transitional care encounter in 1-2 weeks.  2. Antithrombotics: -DVT/anticoagulation:Mechanical:Sequential compression devices, below kneeBilateral lower extremitiesdue to low platelets. -antiplatelet therapy: DAPTwith ASA and plavix 3. Pain Management:tylenol prn. 4. Mood:LCSW to follow for evaluation and support. -antipsychotic agents: N/A 5. Neuropsych: This patientiscapable of making decisions on herown behalf. 6. Skin/Wound Care:groin incisions healing. 7. Fluids/Electrolytes/Nutrition:Monitor I/O.encourage PO  P.o. intake better with megace--continue to follow 8. TAVR/CHB s/p PPM: On ASA/Plavix due to recent TAVR.   -metoprolol as below  -outpt cards/cvts follow up 9. New onset focal motor seizures: Continue Keppra bid X 3 months. Follow up with Dr. Felecia Shelling after discharge.   No recent seizures 11. Loose stools:  Most likely cause is her tagrisso  -off senna   -stopped Fe+ supp  -continue  probiotic and fiber  Encourage adequate po/liquids---follow for now 12. Acute on chronic anemia: no signs of bleeding.     Hemoglobin 10.3 on 9/17, will require amatory labs for follow-up  -repeat aranesp (9/11). Will not send home on this however 13. Acute on chronic renal failure (CKD III):   Cr 1.43 on 9/14, appears to be near baseline 14. Chronic diastolic CHF: Monitor for signs of overload.   Continue Imdur, ASA and   Filed Weights   12/23/18 0459 12/24/18 0545 12/27/18 0412  Weight: 48.5 kg 47.1 kg 49 kg    Stable 9/18 15. Metastatic lung cancer/chronic right pleural effusion: On Tagrisso  RPh has noted QTc monitoring is recommended weekly.   -will hold on EKG as never done and pt has been on this for some time  -pt has pacer 16. Acute on chronic thrombocytopenia: Baseline around 50-->38.     Platelets 60 on 9/17  Continue to monitor 17. Neutropenia: Has been in 2-3 range--monitor ANC for now.  WBC 3.4 on 9/17  Continue to monitor 18. Muscle cramps/spasms- 9/5- has Zanaflex 2 mg q8 hours prn-helpful but it may be exacerbating fluctations in bp  Stopped tizanidine d/t low bp's 19.  History of hypertension Vitals:   12/27/18 0412 12/27/18 0518  BP: (!) 112/32   Pulse: (!) 47 (!) 50  Resp: 16   Temp: 98.3 F (36.8 C)   SpO2: 98%     -Persistent very wide pulse pressures  -plan is to treat SBP  -continue toprol XL 50mg  and imdur 30mg  daily   -Asymptomatic  -We will need outpatient cardiology follow-up 20.  Thrombocytopenia  Platelets 60 on 9/17, overall improving     LOS: 15 days A FACE TO FACE EVALUATION WAS PERFORMED  Stacy Cook Stacy Cook 12/27/2018, 3:48 PM

## 2018-12-30 ENCOUNTER — Other Ambulatory Visit: Payer: Self-pay

## 2018-12-30 ENCOUNTER — Telehealth: Payer: Self-pay | Admitting: *Deleted

## 2018-12-30 ENCOUNTER — Telehealth (INDEPENDENT_AMBULATORY_CARE_PROVIDER_SITE_OTHER): Payer: Medicare Other | Admitting: Student

## 2018-12-30 DIAGNOSIS — I459 Conduction disorder, unspecified: Secondary | ICD-10-CM

## 2018-12-30 LAB — CUP PACEART REMOTE DEVICE CHECK
Battery Impedance: 680 Ohm
Battery Voltage: 3.21 V
Date Time Interrogation Session: 20200922104822
Implantable Pulse Generator Implant Date: 20200901
Lead Channel Pacing Threshold Amplitude: 0.5 V
Lead Channel Pacing Threshold Pulse Width: 0.24 ms
Lead Channel Sensing Intrinsic Amplitude: 17 mV
Lead Channel Setting Pacing Amplitude: 2.13 V
Lead Channel Setting Pacing Pulse Width: 0.24 ms
Lead Channel Setting Sensing Sensitivity: 2.8 mV

## 2018-12-30 NOTE — Progress Notes (Signed)
[place] Pacemaker check virtual. Normal device function per interrogation personally reviewed today.   She had AV conduction ~70% of the time; with VVI+ LRL of 40.  Histograms show mostly pacing in the 60s (when she does not have AV conduction). Thresholds, sensing, impedances stable. Device at appropriate safety margins. Histogram distribution appropriate for patient activity level. Estimated longevity >8.0 years. Patient enrolled in remote follow-ups. Return to clinic to see Dr. Rayann Heman 12/10/220. Next remote same day.   Legrand Como [address] [place], PA-C 12/30/2018 10:41 AM

## 2018-12-30 NOTE — Telephone Encounter (Signed)
Transitional care call completed. Appointment confirmed.  Address confirmed, new patient packet mailed  Transitional Care Questions   Questions for our staff to ask patients on Transitional care 48 hour phone call:   1. Are you/is patient experiencing any problems since coming home? No  Are there any questions regarding any aspect of care? No  2. Are there any questions regarding medications administration/dosing? No Are meds being taken as prescribed? Yes Patient should review meds with caller to confirm   3. Have there been any falls? No   4. Has Home Health been to the house and/or have they contacted you?  Still waiting for [facility] to call  If not, have you tried to contact them? No Can we help you contact them?   5. Are bowels and bladder emptying properly? Yes  Are there any unexpected incontinence issues?  No If applicable, is patient following bowel/bladder programs?   6. Any fevers, problems with breathing, unexpected pain?  No  7. Are there any skin problems or new areas of breakdown?  No  8. Has the patient/family member arranged specialty MD follow up (ie cardiology/neurology/renal/surgical/etc)? Yes Can we help arrange?   9. Does the patient need any other services or support that we can help arrange? No   10. Are caregivers following through as expected in assisting the patient?  Yes  11. Has the patient quit smoking, drinking alcohol, or using drugs as recommended? Never

## 2019-01-01 ENCOUNTER — Telehealth: Payer: Medicare Other | Admitting: Physician Assistant

## 2019-01-02 DIAGNOSIS — I69359 Hemiplegia and hemiparesis following cerebral infarction affecting unspecified side: Secondary | ICD-10-CM | POA: Insufficient documentation

## 2019-01-08 ENCOUNTER — Encounter: Payer: Medicare Other | Admitting: Registered Nurse
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
## 2019-01-15 ENCOUNTER — Other Ambulatory Visit (HOSPITAL_COMMUNITY): Payer: Medicare Other

## 2019-01-15 ENCOUNTER — Other Ambulatory Visit: Payer: Self-pay

## 2019-01-15 ENCOUNTER — Ambulatory Visit (HOSPITAL_COMMUNITY): Payer: Medicare Other | Attending: Cardiology

## 2019-01-15 ENCOUNTER — Ambulatory Visit (INDEPENDENT_AMBULATORY_CARE_PROVIDER_SITE_OTHER): Payer: Medicare Other | Admitting: Physician Assistant

## 2019-01-15 ENCOUNTER — Other Ambulatory Visit: Payer: Self-pay | Admitting: Physician Assistant

## 2019-01-15 VITALS — BP 120/50 | HR 68 | Ht 61.0 in

## 2019-01-15 DIAGNOSIS — Z952 Presence of prosthetic heart valve: Secondary | ICD-10-CM | POA: Insufficient documentation

## 2019-01-15 DIAGNOSIS — Z8673 Personal history of transient ischemic attack (TIA), and cerebral infarction without residual deficits: Secondary | ICD-10-CM | POA: Diagnosis not present

## 2019-01-15 DIAGNOSIS — I5032 Chronic diastolic (congestive) heart failure: Secondary | ICD-10-CM | POA: Diagnosis not present

## 2019-01-15 DIAGNOSIS — C3431 Malignant neoplasm of lower lobe, right bronchus or lung: Secondary | ICD-10-CM

## 2019-01-15 DIAGNOSIS — I10 Essential (primary) hypertension: Secondary | ICD-10-CM | POA: Diagnosis not present

## 2019-01-15 MED ORDER — CLOPIDOGREL BISULFATE 75 MG PO TABS: 75.0000 mg | ORAL_TABLET | Freq: Every day | ORAL | 1 refills | Status: AC

## 2019-01-15 MED ORDER — AMOXICILLIN 500 MG PO TABS: ORAL_TABLET | ORAL | 3 refills | Status: DC

## 2019-01-15 NOTE — Patient Instructions (Addendum)
Medication Instructions:  1) you may STOP PLAVIX 06/04/2019  2) Your provider discussed the importance of taking an antibiotic prior to all dental visits to prevent damage to the heart valves from infection. You were given a prescription for AMOXIL 2,000 mg to take one hour prior to any dental appointment.   If you need a refill on your cardiac medications before your next appointment, please call your pharmacy.   Follow-Up: Please schedule an appointment with the [facility] in 3 months.  You will be called to schedule your 1 year TAVR echo and office visits.

## 2019-01-16 ENCOUNTER — Telehealth: Payer: Self-pay | Admitting: *Deleted

## 2019-01-16 NOTE — Telephone Encounter (Signed)
Patient called. She had a stroke recently and had a 1 month hospitalization. She requested a f/u with Dr. B, which was provided to the patient.

## 2019-01-19 ENCOUNTER — Other Ambulatory Visit: Payer: Self-pay

## 2019-01-20 ENCOUNTER — Inpatient Hospital Stay (HOSPITAL_BASED_OUTPATIENT_CLINIC_OR_DEPARTMENT_OTHER): Payer: Medicare Other | Admitting: Internal Medicine

## 2019-01-20 ENCOUNTER — Encounter: Payer: Self-pay | Admitting: Internal Medicine

## 2019-01-20 ENCOUNTER — Other Ambulatory Visit: Payer: Self-pay

## 2019-01-20 ENCOUNTER — Inpatient Hospital Stay: Payer: Medicare Other | Attending: Internal Medicine

## 2019-01-20 DIAGNOSIS — I2510 Atherosclerotic heart disease of native coronary artery without angina pectoris: Secondary | ICD-10-CM | POA: Diagnosis not present

## 2019-01-20 DIAGNOSIS — K2940 Chronic atrophic gastritis without bleeding: Secondary | ICD-10-CM | POA: Insufficient documentation

## 2019-01-20 DIAGNOSIS — Z952 Presence of prosthetic heart valve: Secondary | ICD-10-CM | POA: Insufficient documentation

## 2019-01-20 DIAGNOSIS — C160 Malignant neoplasm of cardia: Secondary | ICD-10-CM

## 2019-01-20 DIAGNOSIS — K219 Gastro-esophageal reflux disease without esophagitis: Secondary | ICD-10-CM | POA: Insufficient documentation

## 2019-01-20 DIAGNOSIS — I350 Nonrheumatic aortic (valve) stenosis: Secondary | ICD-10-CM | POA: Diagnosis not present

## 2019-01-20 DIAGNOSIS — J90 Pleural effusion, not elsewhere classified: Secondary | ICD-10-CM | POA: Diagnosis not present

## 2019-01-20 DIAGNOSIS — C3431 Malignant neoplasm of lower lobe, right bronchus or lung: Secondary | ICD-10-CM | POA: Insufficient documentation

## 2019-01-20 DIAGNOSIS — E1122 Type 2 diabetes mellitus with diabetic chronic kidney disease: Secondary | ICD-10-CM | POA: Insufficient documentation

## 2019-01-20 DIAGNOSIS — R21 Rash and other nonspecific skin eruption: Secondary | ICD-10-CM | POA: Diagnosis not present

## 2019-01-20 DIAGNOSIS — E785 Hyperlipidemia, unspecified: Secondary | ICD-10-CM | POA: Insufficient documentation

## 2019-01-20 DIAGNOSIS — Z87442 Personal history of urinary calculi: Secondary | ICD-10-CM | POA: Diagnosis not present

## 2019-01-20 DIAGNOSIS — Z79899 Other long term (current) drug therapy: Secondary | ICD-10-CM | POA: Diagnosis not present

## 2019-01-20 DIAGNOSIS — I129 Hypertensive chronic kidney disease with stage 1 through stage 4 chronic kidney disease, or unspecified chronic kidney disease: Secondary | ICD-10-CM | POA: Insufficient documentation

## 2019-01-20 LAB — CBC WITH DIFFERENTIAL/PLATELET
Abs Immature Granulocytes: 0 10*3/uL (ref 0.00–0.07)
Basophils Absolute: 0 10*3/uL (ref 0.0–0.1)
Basophils Relative: 0 %
Eosinophils Absolute: 0.1 10*3/uL (ref 0.0–0.5)
Eosinophils Relative: 3 %
HCT: 29.1 % — ABNORMAL LOW (ref 36.0–46.0)
Hemoglobin: 9.6 g/dL — ABNORMAL LOW (ref 12.0–15.0)
Immature Granulocytes: 0 %
Lymphocytes Relative: 29 %
Lymphs Abs: 0.7 10*3/uL (ref 0.7–4.0)
MCH: 31.3 pg (ref 26.0–34.0)
MCHC: 33 g/dL (ref 30.0–36.0)
MCV: 94.8 fL (ref 80.0–100.0)
Monocytes Absolute: 0.2 10*3/uL (ref 0.1–1.0)
Monocytes Relative: 7 %
Neutro Abs: 1.6 10*3/uL — ABNORMAL LOW (ref 1.7–7.7)
Neutrophils Relative %: 61 %
Platelets: 50 10*3/uL — ABNORMAL LOW (ref 150–400)
RBC: 3.07 MIL/uL — ABNORMAL LOW (ref 3.87–5.11)
RDW: 12.9 % (ref 11.5–15.5)
WBC: 2.5 10*3/uL — ABNORMAL LOW (ref 4.0–10.5)
nRBC: 0 % (ref 0.0–0.2)

## 2019-01-20 LAB — COMPREHENSIVE METABOLIC PANEL
ALT: 11 U/L (ref 0–44)
AST: 15 U/L (ref 15–41)
Albumin: 3.7 g/dL (ref 3.5–5.0)
Alkaline Phosphatase: 50 U/L (ref 38–126)
Anion gap: 7 (ref 5–15)
BUN: 28 mg/dL — ABNORMAL HIGH (ref 8–23)
CO2: 20 mmol/L — ABNORMAL LOW (ref 22–32)
Calcium: 8.6 mg/dL — ABNORMAL LOW (ref 8.9–10.3)
Chloride: 114 mmol/L — ABNORMAL HIGH (ref 98–111)
Creatinine, Ser: 1.6 mg/dL — ABNORMAL HIGH (ref 0.44–1.00)
GFR calc Af Amer: 35 mL/min — ABNORMAL LOW (ref >60)
GFR calc non Af Amer: 30 mL/min — ABNORMAL LOW (ref >60)
Glucose, Bld: 132 mg/dL — ABNORMAL HIGH (ref 70–99)
Potassium: 4.3 mmol/L (ref 3.5–5.1)
Sodium: 141 mmol/L (ref 135–145)
Total Bilirubin: 0.6 mg/dL (ref 0.3–1.2)
Total Protein: 5.9 g/dL — ABNORMAL LOW (ref 6.5–8.1)

## 2019-01-20 NOTE — Assessment & Plan Note (Addendum)
#  Stage IV recurrent adenocarcinoma the lung; EGFR mutated. On  osimertinib 80 mg [Feb 6th 2020].  October 31 2018 CT scan [compared to imaging in March 2020] worsening/ increasing lobulated pleural effusion; [previously FDG non-avid-cytology positive x2].  #Continue osimertinib at this time.  We will repeat a PET scan in approximately month to reassess this response to therapy.  We will check on refills.  #Right pleural effusion-worsening of the recent chest x-rays.  This was evaluated by thoracic surgery at [facility].  Given the complicated postoperative course post TAVR-especially patient is asymptomatic; recommend holding any further intervention at this time await CT scan as above  #Severe aortic stenosis status post TAVR-clinically stable.  Followed by cardiology closely.  #Stroke/seizure-post TAVR; currently on aspirin Plavix stable.  Awaiting neurology evaluation.  # Moderate anemia hemoglobin 9-10-malabsorption/CKD.  Stable  # ITP/osimertinib-chronic however today platelets 50; monitor for now.  If worse consider Promacta.  # fungal dermatistis-right groin; recommend topical miconazole.  # I offered to call the patient's family to give an update on clinical status; patient declines.    # DISPOSITION:  # follow up in 1 months- MD;labs- cbc/cmp; prior PET scan-Dr.B  Cc; Alyson/[name]-

## 2019-01-20 NOTE — Progress Notes (Signed)
Pt in for follow up, reports spoke with nurse yesterday.

## 2019-01-20 NOTE — Patient Instructions (Signed)
#  Recommend over-the-counter/topical miconazole powder/ ointment twice a day to the right groin.
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
## 2019-01-22 ENCOUNTER — Inpatient Hospital Stay: Payer: Medicare Other | Admitting: Adult Health

## 2019-01-22 DIAGNOSIS — I2510 Atherosclerotic heart disease of native coronary artery without angina pectoris: Secondary | ICD-10-CM | POA: Insufficient documentation

## 2019-01-22 DIAGNOSIS — G8191 Hemiplegia, unspecified affecting right dominant side: Secondary | ICD-10-CM

## 2019-01-22 DIAGNOSIS — D709 Neutropenia, unspecified: Secondary | ICD-10-CM | POA: Diagnosis present

## 2019-01-22 DIAGNOSIS — K219 Gastro-esophageal reflux disease without esophagitis: Secondary | ICD-10-CM | POA: Diagnosis present

## 2019-01-22 DIAGNOSIS — D638 Anemia in other chronic diseases classified elsewhere: Secondary | ICD-10-CM

## 2019-01-22 HISTORY — DX: Atherosclerotic heart disease of native coronary artery without angina pectoris: I25.10

## 2019-01-22 HISTORY — DX: Hemiplegia, unspecified affecting right dominant side: G81.91

## 2019-01-28 ENCOUNTER — Other Ambulatory Visit: Payer: Self-pay | Admitting: Pharmacist

## 2019-01-28 DIAGNOSIS — C3431 Malignant neoplasm of lower lobe, right bronchus or lung: Secondary | ICD-10-CM

## 2019-01-28 MED ORDER — OSIMERTINIB MESYLATE 80 MG PO TABS: 80.0000 mg | ORAL_TABLET | Freq: Every day | ORAL | 4 refills | Status: DC

## 2019-02-09 ENCOUNTER — Ambulatory Visit: Payer: Medicare Other | Admitting: Adult Health

## 2019-02-09 ENCOUNTER — Encounter: Payer: Self-pay | Admitting: Adult Health

## 2019-02-09 ENCOUNTER — Other Ambulatory Visit: Payer: Self-pay

## 2019-02-09 VITALS — BP 142/60 | HR 60 | Temp 97.9°F | Ht 61.0 in | Wt 104.4 lb

## 2019-02-09 DIAGNOSIS — Z9861 Coronary angioplasty status: Secondary | ICD-10-CM

## 2019-02-09 DIAGNOSIS — I10 Essential (primary) hypertension: Secondary | ICD-10-CM | POA: Diagnosis not present

## 2019-02-09 DIAGNOSIS — G40209 Localization-related (focal) (partial) symptomatic epilepsy and epileptic syndromes with complex partial seizures, not intractable, without status epilepticus: Secondary | ICD-10-CM | POA: Diagnosis not present

## 2019-02-09 DIAGNOSIS — E785 Hyperlipidemia, unspecified: Secondary | ICD-10-CM

## 2019-02-09 DIAGNOSIS — I2510 Atherosclerotic heart disease of native coronary artery without angina pectoris: Secondary | ICD-10-CM

## 2019-02-09 DIAGNOSIS — I63411 Cerebral infarction due to embolism of right middle cerebral artery: Secondary | ICD-10-CM | POA: Diagnosis not present

## 2019-02-09 DIAGNOSIS — G8191 Hemiplegia, unspecified affecting right dominant side: Secondary | ICD-10-CM | POA: Diagnosis not present

## 2019-02-09 DIAGNOSIS — Z952 Presence of prosthetic heart valve: Secondary | ICD-10-CM

## 2019-02-09 MED ORDER — LEVETIRACETAM 500 MG PO TABS: 500.0000 mg | ORAL_TABLET | Freq: Two times a day (BID) | ORAL | 6 refills | Status: DC
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
## 2019-02-09 NOTE — Patient Instructions (Signed)
Continue to work with home physical and occupational therapy and will likely need to transition to outpatient therapy once completed  Would highly recommend undergoing sleep evaluation for possible underlying sleep apnea - you can discuss this further with your cardiologist or call our office when you are interested in a referral  Continue aspirin 81 mg daily and clopidogrel 75 mg daily for secondary stroke prevention  Continue to follow up with PCP/cardiology regarding blood pressure management   Continue to follow with cardiology as scheduled for ongoing monitoring of your cardiac conditions  Continue to monitor blood pressure at home  Maintain strict control of hypertension with blood pressure goal below 130/90, diabetes with hemoglobin A1c goal below 6.5% and cholesterol with LDL cholesterol (bad cholesterol) goal below 70 mg/dL. I also advised the patient to eat a healthy diet with plenty of whole grains, cereals, fruits and vegetables, exercise regularly and maintain ideal body weight.  Followup in the future with me in 4 months or call earlier if needed       Thank you for coming to see [place] at [facility]. I hope we have been able to provide you high quality care today.  You may receive a patient satisfaction survey over the next few weeks. We would appreciate your feedback and comments so that we may continue to improve ourselves and the health of our patients.

## 2019-02-16 ENCOUNTER — Other Ambulatory Visit: Payer: Self-pay

## 2019-02-16 ENCOUNTER — Encounter
Admission: RE | Admit: 2019-02-16 | Discharge: 2019-02-16 | Disposition: A | Discharge status: Home / Self Care | Payer: Medicare Other | Source: Ambulatory Visit | Attending: Internal Medicine | Admitting: Internal Medicine

## 2019-02-16 DIAGNOSIS — J90 Pleural effusion, not elsewhere classified: Secondary | ICD-10-CM | POA: Insufficient documentation

## 2019-02-16 DIAGNOSIS — I700 Atherosclerosis of aorta: Secondary | ICD-10-CM | POA: Insufficient documentation

## 2019-02-16 DIAGNOSIS — I2510 Atherosclerotic heart disease of native coronary artery without angina pectoris: Secondary | ICD-10-CM | POA: Insufficient documentation

## 2019-02-16 DIAGNOSIS — C3431 Malignant neoplasm of lower lobe, right bronchus or lung: Secondary | ICD-10-CM | POA: Diagnosis present

## 2019-02-16 LAB — GLUCOSE, CAPILLARY: Glucose-Capillary: 78 mg/dL (ref 70–99)

## 2019-02-16 MED ORDER — FLUDEOXYGLUCOSE F - 18 (FDG) INJECTION
5.4100 | Freq: Once | INTRAVENOUS | Status: AC | PRN
  Administered 2019-02-16: 5.41 via INTRAVENOUS

## 2019-02-16 NOTE — Progress Notes (Signed)
I agree with the above plan 

## 2019-02-17 ENCOUNTER — Ambulatory Visit (INDEPENDENT_AMBULATORY_CARE_PROVIDER_SITE_OTHER): Payer: Medicare Other | Admitting: Vascular Surgery

## 2019-02-17 ENCOUNTER — Encounter (INDEPENDENT_AMBULATORY_CARE_PROVIDER_SITE_OTHER): Payer: Self-pay | Admitting: Vascular Surgery

## 2019-02-17 ENCOUNTER — Ambulatory Visit (INDEPENDENT_AMBULATORY_CARE_PROVIDER_SITE_OTHER): Payer: Medicare Other

## 2019-02-17 ENCOUNTER — Telehealth: Payer: Self-pay | Admitting: Internal Medicine

## 2019-02-17 VITALS — BP 126/57 | HR 62 | Resp 18 | Ht 61.0 in | Wt 104.0 lb

## 2019-02-17 DIAGNOSIS — I6523 Occlusion and stenosis of bilateral carotid arteries: Secondary | ICD-10-CM

## 2019-02-17 DIAGNOSIS — C3431 Malignant neoplasm of lower lobe, right bronchus or lung: Secondary | ICD-10-CM

## 2019-02-17 DIAGNOSIS — E1159 Type 2 diabetes mellitus with other circulatory complications: Secondary | ICD-10-CM | POA: Diagnosis not present

## 2019-02-17 DIAGNOSIS — I10 Essential (primary) hypertension: Secondary | ICD-10-CM | POA: Diagnosis not present
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
## 2019-02-17 NOTE — Assessment & Plan Note (Signed)
Carotid duplex today shows 1 to 39% right ICA stenosis and 40 to 59% left ICA stenosis with some progression since her previous study last year. No role for intervention at this level.  Continue current medical regimen.  Recheck in 1 year

## 2019-02-17 NOTE — Telephone Encounter (Signed)
I spoke to patient regarding stable/improved results of the PET scan.  Patient ran out of her osimertinib 1 week ago.   Allison/[name]- please check on the status of her patient's refill of osmertinib.  Patient needs to start back on osimertinib ASAP.

## 2019-02-18 ENCOUNTER — Other Ambulatory Visit: Payer: Self-pay | Admitting: *Deleted

## 2019-02-18 ENCOUNTER — Inpatient Hospital Stay: Payer: Medicare Other | Attending: Internal Medicine

## 2019-02-18 ENCOUNTER — Other Ambulatory Visit: Payer: Self-pay

## 2019-02-18 ENCOUNTER — Inpatient Hospital Stay (HOSPITAL_BASED_OUTPATIENT_CLINIC_OR_DEPARTMENT_OTHER): Payer: Medicare Other | Admitting: Internal Medicine

## 2019-02-18 VITALS — BP 108/54 | HR 60 | Temp 97.7°F | Wt 104.0 lb

## 2019-02-18 DIAGNOSIS — J90 Pleural effusion, not elsewhere classified: Secondary | ICD-10-CM | POA: Diagnosis not present

## 2019-02-18 DIAGNOSIS — D649 Anemia, unspecified: Secondary | ICD-10-CM | POA: Diagnosis not present

## 2019-02-18 DIAGNOSIS — K2940 Chronic atrophic gastritis without bleeding: Secondary | ICD-10-CM | POA: Insufficient documentation

## 2019-02-18 DIAGNOSIS — E1165 Type 2 diabetes mellitus with hyperglycemia: Secondary | ICD-10-CM | POA: Insufficient documentation

## 2019-02-18 DIAGNOSIS — Z87442 Personal history of urinary calculi: Secondary | ICD-10-CM | POA: Insufficient documentation

## 2019-02-18 DIAGNOSIS — C3431 Malignant neoplasm of lower lobe, right bronchus or lung: Secondary | ICD-10-CM

## 2019-02-18 DIAGNOSIS — Z7982 Long term (current) use of aspirin: Secondary | ICD-10-CM | POA: Insufficient documentation

## 2019-02-18 DIAGNOSIS — D61818 Other pancytopenia: Secondary | ICD-10-CM | POA: Insufficient documentation

## 2019-02-18 DIAGNOSIS — R5383 Other fatigue: Secondary | ICD-10-CM | POA: Diagnosis not present

## 2019-02-18 DIAGNOSIS — R21 Rash and other nonspecific skin eruption: Secondary | ICD-10-CM | POA: Insufficient documentation

## 2019-02-18 DIAGNOSIS — M810 Age-related osteoporosis without current pathological fracture: Secondary | ICD-10-CM | POA: Insufficient documentation

## 2019-02-18 DIAGNOSIS — K219 Gastro-esophageal reflux disease without esophagitis: Secondary | ICD-10-CM | POA: Insufficient documentation

## 2019-02-18 DIAGNOSIS — N289 Disorder of kidney and ureter, unspecified: Secondary | ICD-10-CM | POA: Insufficient documentation

## 2019-02-18 DIAGNOSIS — R630 Anorexia: Secondary | ICD-10-CM | POA: Diagnosis not present

## 2019-02-18 DIAGNOSIS — I10 Essential (primary) hypertension: Secondary | ICD-10-CM | POA: Diagnosis not present

## 2019-02-18 DIAGNOSIS — Z79899 Other long term (current) drug therapy: Secondary | ICD-10-CM | POA: Diagnosis not present

## 2019-02-18 DIAGNOSIS — M129 Arthropathy, unspecified: Secondary | ICD-10-CM | POA: Diagnosis not present

## 2019-02-18 LAB — CBC WITH DIFFERENTIAL/PLATELET
Abs Immature Granulocytes: 0 10*3/uL (ref 0.00–0.07)
Basophils Absolute: 0 10*3/uL (ref 0.0–0.1)
Basophils Relative: 1 %
Eosinophils Absolute: 0 10*3/uL (ref 0.0–0.5)
Eosinophils Relative: 1 %
HCT: 25.9 % — ABNORMAL LOW (ref 36.0–46.0)
Hemoglobin: 8.8 g/dL — ABNORMAL LOW (ref 12.0–15.0)
Immature Granulocytes: 0 %
Lymphocytes Relative: 34 %
Lymphs Abs: 0.7 10*3/uL (ref 0.7–4.0)
MCH: 31.5 pg (ref 26.0–34.0)
MCHC: 34 g/dL (ref 30.0–36.0)
MCV: 92.8 fL (ref 80.0–100.0)
Monocytes Absolute: 0.2 10*3/uL (ref 0.1–1.0)
Monocytes Relative: 8 %
Neutro Abs: 1.1 10*3/uL — ABNORMAL LOW (ref 1.7–7.7)
Neutrophils Relative %: 56 %
Platelets: 45 10*3/uL — ABNORMAL LOW (ref 150–400)
RBC: 2.79 MIL/uL — ABNORMAL LOW (ref 3.87–5.11)
RDW: 12.2 % (ref 11.5–15.5)
WBC: 1.9 10*3/uL — ABNORMAL LOW (ref 4.0–10.5)
nRBC: 0 % (ref 0.0–0.2)

## 2019-02-18 LAB — COMPREHENSIVE METABOLIC PANEL
ALT: 25 U/L (ref 0–44)
AST: 22 U/L (ref 15–41)
Albumin: 3.8 g/dL (ref 3.5–5.0)
Alkaline Phosphatase: 57 U/L (ref 38–126)
Anion gap: 6 (ref 5–15)
BUN: 33 mg/dL — ABNORMAL HIGH (ref 8–23)
CO2: 24 mmol/L (ref 22–32)
Calcium: 8.6 mg/dL — ABNORMAL LOW (ref 8.9–10.3)
Chloride: 110 mmol/L (ref 98–111)
Creatinine, Ser: 1.95 mg/dL — ABNORMAL HIGH (ref 0.44–1.00)
GFR calc Af Amer: 27 mL/min — ABNORMAL LOW (ref >60)
GFR calc non Af Amer: 24 mL/min — ABNORMAL LOW (ref >60)
Glucose, Bld: 120 mg/dL — ABNORMAL HIGH (ref 70–99)
Potassium: 3.9 mmol/L (ref 3.5–5.1)
Sodium: 140 mmol/L (ref 135–145)
Total Bilirubin: 0.7 mg/dL (ref 0.3–1.2)
Total Protein: 6.1 g/dL — ABNORMAL LOW (ref 6.5–8.1)

## 2019-02-18 LAB — LACTATE DEHYDROGENASE: LDH: 129 U/L (ref 98–192)

## 2019-02-18 NOTE — Assessment & Plan Note (Addendum)
#  Stage IV recurrent adenocarcinoma the lung; EGFR mutated. On  osimertinib 80 mg [Feb 6th 2020]. [previously FDG non-avid-cytology positive x2]. NOV 10th 2020 -PET scan shows stable/slightly decreased right loculated pleural effusion; no obvious evidence of any hypermetabolic pleural plaques/lymphadenopathy.  Hypermetabolic lymph node stomach without any thickening-see below  #I reviewed the encouraging results on the PET scan with the patient; however recommend holding osimertinib-given the overall pancytopenia [see below]  #Pancytopenia White count of 1.9/ANC 1.1; hemoglobin 8.9; platelets 45-overall slightly worse/stable.  Question osimertinib however patient not been taking for last 1 week.  Will get LDH today/review of peripheral smear-low suspicion of hemolysis.  Discussed that if continues to get worse would recommend bone marrow biopsy.  Consider Promacta if platelets continue to drop.   #Loculated pleural effusion-most likely malignant/overall stable/improved.  # Renal dysfunction: Creatinine 1.9; slightly worse baseline 1.4-1.5.  I have message Dr. Singh patient's nephrologist for further recommendations.  # DISPOSITION: ask lab to add LDH / T-review # follow up in 2 weeks;  MD;labs- cbc/cmp; Dr.B    
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
## 2019-02-18 NOTE — Telephone Encounter (Signed)
She is receiving free medication through manufacturer assistance. There are refills at the pharmacy she just needs to call and request a refill. I will call and make sure she knows how to contact the pharmacy.

## 2019-02-18 NOTE — Patient Instructions (Signed)
DO NOT START OSIMERTINIB until further directions/labs- next visit.

## 2019-03-04 ENCOUNTER — Other Ambulatory Visit: Payer: Self-pay

## 2019-03-04 ENCOUNTER — Inpatient Hospital Stay: Payer: Medicare Other

## 2019-03-04 ENCOUNTER — Inpatient Hospital Stay (HOSPITAL_BASED_OUTPATIENT_CLINIC_OR_DEPARTMENT_OTHER): Payer: Medicare Other | Admitting: Internal Medicine

## 2019-03-04 DIAGNOSIS — C3431 Malignant neoplasm of lower lobe, right bronchus or lung: Secondary | ICD-10-CM | POA: Diagnosis not present

## 2019-03-04 DIAGNOSIS — D649 Anemia, unspecified: Secondary | ICD-10-CM

## 2019-03-04 LAB — CBC WITH DIFFERENTIAL/PLATELET
Abs Immature Granulocytes: 0 10*3/uL (ref 0.00–0.07)
Basophils Absolute: 0 10*3/uL (ref 0.0–0.1)
Basophils Relative: 0 %
Eosinophils Absolute: 0.1 10*3/uL (ref 0.0–0.5)
Eosinophils Relative: 2 %
HCT: 28.1 % — ABNORMAL LOW (ref 36.0–46.0)
Hemoglobin: 9.2 g/dL — ABNORMAL LOW (ref 12.0–15.0)
Immature Granulocytes: 0 %
Lymphocytes Relative: 32 %
Lymphs Abs: 0.9 10*3/uL (ref 0.7–4.0)
MCH: 30.6 pg (ref 26.0–34.0)
MCHC: 32.7 g/dL (ref 30.0–36.0)
MCV: 93.4 fL (ref 80.0–100.0)
Monocytes Absolute: 0.2 10*3/uL (ref 0.1–1.0)
Monocytes Relative: 7 %
Neutro Abs: 1.7 10*3/uL (ref 1.7–7.7)
Neutrophils Relative %: 59 %
Platelets: 58 10*3/uL — ABNORMAL LOW (ref 150–400)
RBC: 3.01 MIL/uL — ABNORMAL LOW (ref 3.87–5.11)
RDW: 12.5 % (ref 11.5–15.5)
WBC: 2.8 10*3/uL — ABNORMAL LOW (ref 4.0–10.5)
nRBC: 0 % (ref 0.0–0.2)

## 2019-03-04 LAB — COMPREHENSIVE METABOLIC PANEL
ALT: 22 U/L (ref 0–44)
AST: 20 U/L (ref 15–41)
Albumin: 3.7 g/dL (ref 3.5–5.0)
Alkaline Phosphatase: 56 U/L (ref 38–126)
Anion gap: 6 (ref 5–15)
BUN: 37 mg/dL — ABNORMAL HIGH (ref 8–23)
CO2: 24 mmol/L (ref 22–32)
Calcium: 8.6 mg/dL — ABNORMAL LOW (ref 8.9–10.3)
Chloride: 110 mmol/L (ref 98–111)
Creatinine, Ser: 1.5 mg/dL — ABNORMAL HIGH (ref 0.44–1.00)
GFR calc Af Amer: 37 mL/min — ABNORMAL LOW (ref >60)
GFR calc non Af Amer: 32 mL/min — ABNORMAL LOW (ref >60)
Glucose, Bld: 87 mg/dL (ref 70–99)
Potassium: 3.7 mmol/L (ref 3.5–5.1)
Sodium: 140 mmol/L (ref 135–145)
Total Bilirubin: 0.6 mg/dL (ref 0.3–1.2)
Total Protein: 6.4 g/dL — ABNORMAL LOW (ref 6.5–8.1)

## 2019-03-04 LAB — LACTATE DEHYDROGENASE: LDH: 132 U/L (ref 98–192)

## 2019-03-04 LAB — TECHNOLOGIST SMEAR REVIEW
Plt Morphology: NORMAL
RBC Morphology: NORMAL

## 2019-03-04 NOTE — Addendum Note (Signed)
Addended by: Sabino Gasser on: 03/04/2019 10:12 AM   Modules accepted: Orders
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
## 2019-03-04 NOTE — Assessment & Plan Note (Addendum)
#  Stage IV recurrent adenocarcinoma the lung; EGFR mutated. On  osimertinib 80 mg [Feb 6th 2020]. [previously FDG non-avid-cytology positive x2]. NOV 10th 2020 -PET scan shows stable/slightly decreased right loculated pleural effusion; no obvious evidence of any hypermetabolic pleural plaques/lymphadenopathy.  Hypermetabolic lymph node stomach without any thickening-see below. STABLE.   # Continue Osimertinib 80 mg/day;   #Pancytopenia- Unclear etiology; White count of 2.8/ANC 2.1; hemoglobin; 9.2 platelets 58-overall slightly improved. Monitor for now.   # right sided stroke/ seizures- Stable; on asprin; f/u neurology.   #Loculated pleural effusion-most likely malignant/overall- STABLE.   # CKD- stage III-IV Creatinine 1.5; STABLE; Discussed with Dr.Singh  # DISPOSITION: # follow up in 4 weeks;  MD;labs- cbc/cmp; Dr.B

## 2019-03-09 ENCOUNTER — Telehealth: Payer: Self-pay

## 2019-03-09 ENCOUNTER — Ambulatory Visit: Payer: Medicare Other | Admitting: Neurology

## 2019-03-09 ENCOUNTER — Other Ambulatory Visit: Payer: Self-pay

## 2019-03-09 DIAGNOSIS — G40209 Localization-related (focal) (partial) symptomatic epilepsy and epileptic syndromes with complex partial seizures, not intractable, without status epilepticus: Secondary | ICD-10-CM

## 2019-03-09 NOTE — Telephone Encounter (Signed)
Nutrition Assessment   Reason for Assessment: Patient identified on Malnutrition screening report for weight loss and poor appetite.     ASSESSMENT:  83 year old female with right lung cancer being treated with osimertinib followed by Dr. Jacinto Reap.  Past medical history of parital gastrectomy from carcinoid tumor, chronic atrophic gastritis,  GERD, anemia, B12 deficiency, right lower lobectomy, HLD, HTN, pacemaker, CKD, DM  Spoke with patient via phone to introduce self and service at cancer center.  Patient reports that she has recently added ensure original 1 a day and a protein bar to her eating pattern.  Reports that she does not have much appetite.  Has been trying to eat good sources of protein (ie chicken, red meat, lima beans, eggs but does not really like them).  Reports that dairy products at times upsets her stomach.  Reports over the weekend had nausea and diarrhea after drinking ensure.  Reports other times she is able to tolerate shakes.  Reports that she does have a little bit of nausea at times.    Nutrition Focused Physical Exam: deferred   Medications: Vit D, Vit B 12, fe sulfate, zofran, protonix   Labs: BUN 37, creatinine 1.50   Anthropometrics:   Height: 61 inches Weight: 106 lb up from 104 lb Noted 113 lb on 11/10/2018 BMI: 20  6% weight loss in the last 3 1/2 months   Estimated Energy Needs  Kcals: 1440-1680 Protein: 72-84g Fluid: > 1.4 L    NUTRITION DIAGNOSIS: Inadequate oral intake related to cancer and cancer related treatment side effects as evidenced by 6% weight loss in the last 3 1/2 months, poor appetite   INTERVENTION:  Discussed strategies to increase calories and protein.  Will mail handout. Encouraged small frequent meals with protein food included.   Discussed option of trying lactaid products or dairy free alternatives.   Will have sample bag of dairy free shakes for patient to pick up at next appointment on 12/23.   Contact information  provided.     MONITORING, EVALUATION, GOAL:  Patient will consume adequate oral intake to prevent weight loss   Next Visit: phone f/u Jan 11th  Ardell Aaronson B. Zenia Resides, [place], [place] Registered Dietitian [phone] (pager)

## 2019-03-16 ENCOUNTER — Telehealth: Payer: Self-pay

## 2019-03-16 NOTE — Telephone Encounter (Signed)
Called pt on behalf of NP Frann Rider.  For her recent EEG results. Pt demonstrated understanding.   "Notes recorded by Frann Rider, NP on 03/11/2019 at 8:28 AM EST  Please advise patient that recent EEG normal" -NP JM

## 2019-03-19 ENCOUNTER — Ambulatory Visit (INDEPENDENT_AMBULATORY_CARE_PROVIDER_SITE_OTHER): Payer: Medicare Other | Admitting: *Deleted

## 2019-03-19 ENCOUNTER — Encounter: Payer: Self-pay | Admitting: Internal Medicine

## 2019-03-19 ENCOUNTER — Other Ambulatory Visit: Payer: Self-pay

## 2019-03-19 ENCOUNTER — Ambulatory Visit: Payer: Medicare Other | Admitting: Internal Medicine

## 2019-03-19 ENCOUNTER — Telehealth: Payer: Self-pay | Admitting: Internal Medicine

## 2019-03-19 VITALS — BP 124/44 | HR 68 | Ht 61.0 in | Wt 104.2 lb

## 2019-03-19 DIAGNOSIS — I459 Conduction disorder, unspecified: Secondary | ICD-10-CM | POA: Diagnosis not present

## 2019-03-19 DIAGNOSIS — Z950 Presence of cardiac pacemaker: Secondary | ICD-10-CM | POA: Diagnosis not present

## 2019-03-19 HISTORY — DX: Presence of cardiac pacemaker: Z95.0

## 2019-03-19 LAB — CUP PACEART REMOTE DEVICE CHECK
Battery Remaining Longevity: 96 mo
Battery Voltage: 3.13 V
Brady Statistic AS VP Percent: 42.8 %
Brady Statistic AS VS Percent: 0.08 %
Brady Statistic RV Percent Paced: 76.66 %
Date Time Interrogation Session: 20201210122100
Implantable Pulse Generator Implant Date: 20200901
Lead Channel Impedance Value: 580 Ohm
Lead Channel Pacing Threshold Amplitude: 0.5 V
Lead Channel Pacing Threshold Pulse Width: 0.24 ms
Lead Channel Sensing Intrinsic Amplitude: 15.3 mV
Lead Channel Setting Pacing Amplitude: 2 V
Lead Channel Setting Pacing Pulse Width: 0.24 ms
Lead Channel Setting Sensing Sensitivity: 2.8 mV

## 2019-03-19 LAB — CUP PACEART INCLINIC DEVICE CHECK
Brady Statistic RV Percent Paced: 76.7 %
Date Time Interrogation Session: 20201210190115
Implantable Pulse Generator Implant Date: 20200901
Lead Channel Impedance Value: 620 Ohm
Lead Channel Pacing Threshold Amplitude: 0.5 V
Lead Channel Pacing Threshold Pulse Width: 0.24 ms
Lead Channel Setting Pacing Amplitude: 2.13 V
Lead Channel Setting Pacing Pulse Width: 0.24 ms
Lead Channel Setting Sensing Sensitivity: 2.8 mV

## 2019-03-19 NOTE — Patient Instructions (Addendum)
Medication Instructions:  Your physician recommends that you continue on your current medications as directed. Please refer to the Current Medication list given to you today.  Labwork: None ordered.  Testing/Procedures: None ordered.  Follow-Up:  Your physician wants you to follow-up in: 6 months with Oda Kilts, PA.   You will receive a reminder letter in the mail two months in advance. If you don't receive a letter, please call our office to schedule the follow-up appointment.  Your physician wants you to follow-up in: one year with Dr. Rayann Heman.   You will receive a reminder letter in the mail two months in advance. If you don't receive a letter, please call our office to schedule the follow-up appointment.  Remote monitoring is used to monitor your Pacemaker from home. This monitoring reduces the number of office visits required to check your device to one time per year. It allows [place] to keep an eye on the functioning of your device to ensure it is working properly. You are scheduled for a device check from home on 06/23/2019. You may send your transmission at any time that day. If you have a wireless device, the transmission will be sent automatically. After your physician reviews your transmission, you will receive a postcard with your next transmission date.  Any Other Special Instructions Will Be Listed Below (If Applicable).  If you need a refill on your cardiac medications before your next appointment, please call your pharmacy.

## 2019-03-19 NOTE — Telephone Encounter (Signed)
New Message:    Pt wants to know if you received her transmission. She is not sure how to do it.
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
## 2019-03-19 NOTE — Telephone Encounter (Signed)
I help the pt send the manual transmission. Transmission received.

## 2019-03-20 DIAGNOSIS — M791 Myalgia, unspecified site: Secondary | ICD-10-CM | POA: Insufficient documentation

## 2019-04-01 ENCOUNTER — Inpatient Hospital Stay (HOSPITAL_BASED_OUTPATIENT_CLINIC_OR_DEPARTMENT_OTHER): Payer: Medicare Other | Admitting: Internal Medicine

## 2019-04-01 ENCOUNTER — Inpatient Hospital Stay: Payer: Medicare Other | Attending: Internal Medicine

## 2019-04-01 ENCOUNTER — Other Ambulatory Visit: Payer: Self-pay

## 2019-04-01 ENCOUNTER — Encounter: Payer: Self-pay | Admitting: Internal Medicine

## 2019-04-01 DIAGNOSIS — K219 Gastro-esophageal reflux disease without esophagitis: Secondary | ICD-10-CM | POA: Diagnosis not present

## 2019-04-01 DIAGNOSIS — Z85828 Personal history of other malignant neoplasm of skin: Secondary | ICD-10-CM | POA: Insufficient documentation

## 2019-04-01 DIAGNOSIS — E785 Hyperlipidemia, unspecified: Secondary | ICD-10-CM | POA: Diagnosis not present

## 2019-04-01 DIAGNOSIS — Z87442 Personal history of urinary calculi: Secondary | ICD-10-CM | POA: Insufficient documentation

## 2019-04-01 DIAGNOSIS — C3431 Malignant neoplasm of lower lobe, right bronchus or lung: Secondary | ICD-10-CM

## 2019-04-01 DIAGNOSIS — Z7982 Long term (current) use of aspirin: Secondary | ICD-10-CM | POA: Diagnosis not present

## 2019-04-01 DIAGNOSIS — J90 Pleural effusion, not elsewhere classified: Secondary | ICD-10-CM | POA: Diagnosis not present

## 2019-04-01 DIAGNOSIS — Z8673 Personal history of transient ischemic attack (TIA), and cerebral infarction without residual deficits: Secondary | ICD-10-CM | POA: Diagnosis not present

## 2019-04-01 DIAGNOSIS — R221 Localized swelling, mass and lump, neck: Secondary | ICD-10-CM | POA: Diagnosis not present

## 2019-04-01 DIAGNOSIS — I2510 Atherosclerotic heart disease of native coronary artery without angina pectoris: Secondary | ICD-10-CM | POA: Insufficient documentation

## 2019-04-01 DIAGNOSIS — R634 Abnormal weight loss: Secondary | ICD-10-CM | POA: Insufficient documentation

## 2019-04-01 DIAGNOSIS — N183 Chronic kidney disease, stage 3 unspecified: Secondary | ICD-10-CM | POA: Insufficient documentation

## 2019-04-01 DIAGNOSIS — M810 Age-related osteoporosis without current pathological fracture: Secondary | ICD-10-CM | POA: Diagnosis not present

## 2019-04-01 DIAGNOSIS — M129 Arthropathy, unspecified: Secondary | ICD-10-CM | POA: Diagnosis not present

## 2019-04-01 DIAGNOSIS — Z85020 Personal history of malignant carcinoid tumor of stomach: Secondary | ICD-10-CM | POA: Insufficient documentation

## 2019-04-01 DIAGNOSIS — Z79899 Other long term (current) drug therapy: Secondary | ICD-10-CM | POA: Diagnosis not present

## 2019-04-01 DIAGNOSIS — E1122 Type 2 diabetes mellitus with diabetic chronic kidney disease: Secondary | ICD-10-CM | POA: Insufficient documentation

## 2019-04-01 DIAGNOSIS — D61818 Other pancytopenia: Secondary | ICD-10-CM | POA: Insufficient documentation

## 2019-04-01 DIAGNOSIS — I129 Hypertensive chronic kidney disease with stage 1 through stage 4 chronic kidney disease, or unspecified chronic kidney disease: Secondary | ICD-10-CM | POA: Diagnosis not present

## 2019-04-01 DIAGNOSIS — E538 Deficiency of other specified B group vitamins: Secondary | ICD-10-CM | POA: Diagnosis not present

## 2019-04-01 LAB — CBC WITH DIFFERENTIAL/PLATELET
Abs Immature Granulocytes: 0.01 10*3/uL (ref 0.00–0.07)
Basophils Absolute: 0 10*3/uL (ref 0.0–0.1)
Basophils Relative: 1 %
Eosinophils Absolute: 0.1 10*3/uL (ref 0.0–0.5)
Eosinophils Relative: 2 %
HCT: 32.4 % — ABNORMAL LOW (ref 36.0–46.0)
Hemoglobin: 10.3 g/dL — ABNORMAL LOW (ref 12.0–15.0)
Immature Granulocytes: 0 %
Lymphocytes Relative: 28 %
Lymphs Abs: 0.9 10*3/uL (ref 0.7–4.0)
MCH: 30.7 pg (ref 26.0–34.0)
MCHC: 31.8 g/dL (ref 30.0–36.0)
MCV: 96.4 fL (ref 80.0–100.0)
Monocytes Absolute: 0.2 10*3/uL (ref 0.1–1.0)
Monocytes Relative: 6 %
Neutro Abs: 2.1 10*3/uL (ref 1.7–7.7)
Neutrophils Relative %: 63 %
Platelets: 41 10*3/uL — ABNORMAL LOW (ref 150–400)
RBC: 3.36 MIL/uL — ABNORMAL LOW (ref 3.87–5.11)
RDW: 12.8 % (ref 11.5–15.5)
WBC: 3.3 10*3/uL — ABNORMAL LOW (ref 4.0–10.5)
nRBC: 0 % (ref 0.0–0.2)

## 2019-04-01 LAB — COMPREHENSIVE METABOLIC PANEL
ALT: 48 U/L — ABNORMAL HIGH (ref 0–44)
AST: 31 U/L (ref 15–41)
Albumin: 3.8 g/dL (ref 3.5–5.0)
Alkaline Phosphatase: 57 U/L (ref 38–126)
Anion gap: 10 (ref 5–15)
BUN: 28 mg/dL — ABNORMAL HIGH (ref 8–23)
CO2: 21 mmol/L — ABNORMAL LOW (ref 22–32)
Calcium: 9.1 mg/dL (ref 8.9–10.3)
Chloride: 110 mmol/L (ref 98–111)
Creatinine, Ser: 1.6 mg/dL — ABNORMAL HIGH (ref 0.44–1.00)
GFR calc Af Amer: 35 mL/min — ABNORMAL LOW (ref >60)
GFR calc non Af Amer: 30 mL/min — ABNORMAL LOW (ref >60)
Glucose, Bld: 87 mg/dL (ref 70–99)
Potassium: 4.1 mmol/L (ref 3.5–5.1)
Sodium: 141 mmol/L (ref 135–145)
Total Bilirubin: 0.8 mg/dL (ref 0.3–1.2)
Total Protein: 6.2 g/dL — ABNORMAL LOW (ref 6.5–8.1)

## 2019-04-01 MED ORDER — ELTROMBOPAG OLAMINE 25 MG PO TABS: 25.0000 mg | ORAL_TABLET | Freq: Every day | ORAL | 6 refills | Status: DC

## 2019-04-01 NOTE — Assessment & Plan Note (Addendum)
#  Stage IV recurrent adenocarcinoma the lung; EGFR mutated. On  osimertinib 80 mg [Feb 6th 2020]. NOV 10th 2020 -PET scan shows stable/slightly decreased right loculated pleural effusion; no obvious evidence of any hypermetabolic pleural plaques/lymphadenopathy.  Hypermetabolic lymph node stomach without any thickening-see below. STABLE.    # Continue Osimertinib 80 mg/day-patient tolerating fairly well without any major side effects.  #Worsening thrombocytopenia-platelets 40s; likely ITP; poor candidate for steroids; patient on aspirin Plavix.  Recommend starting Promacta.  Discussed the potential side effects-including thrombosis.  Recommend starting at 25 mg a day.   #Pancytopenia- Unclear etiology; question osimertinib.  White count of 3.2 ANC 2.1; hemoglobin-10.2; platelets 41.-See above  # right sided stroke/ seizures-stable on asprin/Plavix; f/u neurology.   #Loculated pleural effusion-most likely malignant/overall-stable  # weight loss-multifactorial.  Recommend referral to nutrition.  # left neck mass- ~1cm; vague.  Monitor for now.  Recommend Tylenol as needed.  # CKD- stage III-IV Creatinine 1.5 stable.  #We will discuss palliative care evaluation at next visit.   # DISPOSITION: # follow up in 4 weeks;  MD;labs- cbc/cmp; Dr.B
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
## 2019-04-02 ENCOUNTER — Telehealth: Payer: Self-pay | Admitting: Pharmacy Technician

## 2019-04-02 ENCOUNTER — Telehealth: Payer: Self-pay | Admitting: Pharmacist

## 2019-04-02 ENCOUNTER — Telehealth: Payer: Self-pay | Admitting: *Deleted

## 2019-04-02 DIAGNOSIS — C3431 Malignant neoplasm of lower lobe, right bronchus or lung: Secondary | ICD-10-CM

## 2019-04-02 DIAGNOSIS — R634 Abnormal weight loss: Secondary | ICD-10-CM

## 2019-04-02 MED ORDER — ELTROMBOPAG OLAMINE 25 MG PO TABS: 25.0000 mg | ORAL_TABLET | Freq: Every day | ORAL | 6 refills | Status: DC

## 2019-04-02 NOTE — Telephone Encounter (Signed)
Oral Oncology Patient Advocate Encounter  Received notification from [employer] that prior authorization for Tagrisso is required.  PA submitted on CoverMyMeds Key [id number] Status is pending  Oral Oncology Clinic will continue to follow.  [place] Patient [place] Phone [phone] Fax [phone] 04/02/2019 9:17 AM

## 2019-04-02 NOTE — Telephone Encounter (Signed)
Oral Oncology Pharmacist Encounter  Received new prescription for Promacta (eltrombopag) for the treatment of ITP, planned duration until disease progression or unacceptable drug toxicity.  CBC/CMP from 04/01/2019 assessed, no relevant lab abnormalities. Prescription dose and frequency assessed.   Current medication list in Epic reviewed, no DDIs with eltrobopag identified.  Prescription has been e-scribed to the [facility] for benefits analysis and approval.  Oral Oncology Clinic will continue to follow for insurance authorization, copayment issues, initial counseling and start date.  Darl Pikes, PharmD, BCPS, [facility] Hematology/Oncology Clinical Pharmacist [facility]/AP [facility] [place] [facility] [phone]  04/02/2019 8:53 AM

## 2019-04-02 NOTE — Telephone Encounter (Signed)
-----   Message from Cammie Sickle, MD sent at 04/02/2019  7:52 AM EST ----- Regarding: nutrition referral H/T-please make a referral to [place]/virtual visit would be helpful to the patient  Thanks GB

## 2019-04-02 NOTE — Telephone Encounter (Signed)
Oral Oncology Patient Advocate Encounter  Prior Authorization for [name] has been approved.    PA# [id number] Effective dates: 04/02/2019 through 04/08/2020  Patient is currently receiving medication from [employer] Pres[employer] Program.  This was a renewal PA.  Oral Oncology Clinic will continue to follow.   [place] Patient [place] Phone [phone] Fax [phone] 04/02/2019 9:18 AM

## 2019-04-06 NOTE — Telephone Encounter (Signed)
Oral Oncology Patient Advocate Encounter  Received notification from [employer] that the request for prior authorization for Promacta has been denied due to platelet count  being < 30,000/mcL.    Once the appeals packet is completed it will be faxed to [phone].  This encounter will continue to be updated until final determination.    [place] Patient [place] Phone [phone] Fax [phone] 04/06/2019 8:48 AM

## 2019-04-06 NOTE — Telephone Encounter (Signed)
Oral Oncology Patient Advocate Encounter  Received notification from [employer] that prior authorization for Promacta is required.  PA submitted on CoverMyMeds on 04/02/2019. Key [id number] Status is pending  Oral Oncology Clinic will continue to follow.  [place] Patient [place] Phone [phone] Fax [phone]

## 2019-04-07 ENCOUNTER — Telehealth: Payer: Self-pay | Admitting: *Deleted

## 2019-04-07 NOTE — Telephone Encounter (Signed)
Oral Chemotherapy Pharmacist Encounter  Patient will start Promacta using a one month free voucher. [facility] will deliver medication on 04/09/2019.  Patient Education I spoke with patient for overview of new oral chemotherapy medication: Promacta (eltombopag)for the treatment of ITP, planned duration until disease progression or unacceptable drug toxicity.   Counseled patient on administration, dosing, side effects, monitoring, drug-food interactions, safe handling, storage, and disposal. Patient will take 1 tablet (25 mg total) by mouth daily. Take on an empty stomach, 1 hour before a meal or 2 hours after.  Side effects include but not limited to: N/V.    Reviewed with patient importance of keeping a medication schedule and plan for any missed doses.  Stacy Cook voiced understanding and appreciation. All questions answered. Medication handout placed in the mail.  Provided patient with Oral [place] [facility] phone number. Patient knows to call the office with questions or concerns. Oral Chemotherapy Navigation Clinic will continue to follow.  Darl Pikes, PharmD, BCPS, [facility] Hematology/Oncology Clinical Pharmacist [facility] [place] [facility] [phone]  04/07/2019 2:30 PM

## 2019-04-07 NOTE — Telephone Encounter (Signed)
Patient has informed office that her insurance will be changing to [facility] on 04/10/19.  We are going to initiate a prior authorization for Promacta in January.  We will use a voucher to provide patient with medication for 30 days while getting insurance approval and any copay assistance.

## 2019-04-07 NOTE — Telephone Encounter (Signed)
Spoke with patient. She inquired if promacta was sent to her pharmacy. She did not receive any script before leaving last week. I explained to her that this is a script for mail order. Our team has been working on a PA for this script. At this time, the script is not at her local pharmacy. Pt gave verbal understanding.

## 2019-04-07 NOTE — Telephone Encounter (Signed)
Pt called back. Her insurance changes to [place] on 04/10/19. She will bring a copy of her new card to cancer center. Pt is hopeful this will help with PA process for new insurance year. PA team made aware

## 2019-04-08 MED FILL — PROMACTA 25 MG TABLET: 25 | 30 days supply | Qty: 30 | Fill #0

## 2019-04-09 ENCOUNTER — Telehealth: Payer: Self-pay | Admitting: Pharmacy Technician

## 2019-04-09 NOTE — Telephone Encounter (Signed)
Oral Oncology Patient Advocate Encounter  Patient signed Medicare D Attestation form and patient consent form and mailed to [employer] in December.  I called [employer] today to check the status of re-enrollment.  Representative stated that the patient has been re-enrolled into their program and will continue to receive Tagrisso from the manufacturer at $0 out of pocket until 04/08/2020.    I called and spoke with patient.  She knows we will have to re-apply.   Patient knows to call the office with questions or concerns.   Oral Oncology Clinic will continue to follow.  [place] Patient [place] Phone [phone] Fax [phone] 04/09/2019 11:43 AM

## 2019-04-20 ENCOUNTER — Inpatient Hospital Stay: Payer: Medicare PPO

## 2019-04-20 NOTE — Progress Notes (Signed)
Nutrition  Noted patient cancelled nutrition follow-up appointment for today.  Appointment has not been rescheduled.  RD available if needed in the future.   Zeniya Lapidus B. Zenia Resides, [place], [place] Registered Dietitian [phone] (pager)

## 2019-04-21 NOTE — Telephone Encounter (Signed)
New insurance [facility]) did not require prior authorization for Promacta and patient has a $100 copay.

## 2019-04-25 NOTE — Progress Notes (Signed)
PPM Remote  

## 2019-04-28 ENCOUNTER — Other Ambulatory Visit: Payer: Self-pay

## 2019-04-29 ENCOUNTER — Inpatient Hospital Stay (HOSPITAL_BASED_OUTPATIENT_CLINIC_OR_DEPARTMENT_OTHER): Payer: Medicare PPO | Admitting: Internal Medicine

## 2019-04-29 ENCOUNTER — Other Ambulatory Visit: Payer: Self-pay

## 2019-04-29 ENCOUNTER — Inpatient Hospital Stay: Payer: Medicare PPO | Attending: Internal Medicine

## 2019-04-29 DIAGNOSIS — Z8673 Personal history of transient ischemic attack (TIA), and cerebral infarction without residual deficits: Secondary | ICD-10-CM | POA: Insufficient documentation

## 2019-04-29 DIAGNOSIS — C3431 Malignant neoplasm of lower lobe, right bronchus or lung: Secondary | ICD-10-CM | POA: Insufficient documentation

## 2019-04-29 DIAGNOSIS — Z7982 Long term (current) use of aspirin: Secondary | ICD-10-CM | POA: Insufficient documentation

## 2019-04-29 DIAGNOSIS — Z85828 Personal history of other malignant neoplasm of skin: Secondary | ICD-10-CM | POA: Insufficient documentation

## 2019-04-29 DIAGNOSIS — M810 Age-related osteoporosis without current pathological fracture: Secondary | ICD-10-CM | POA: Diagnosis not present

## 2019-04-29 DIAGNOSIS — I2510 Atherosclerotic heart disease of native coronary artery without angina pectoris: Secondary | ICD-10-CM | POA: Insufficient documentation

## 2019-04-29 DIAGNOSIS — D693 Immune thrombocytopenic purpura: Secondary | ICD-10-CM | POA: Insufficient documentation

## 2019-04-29 DIAGNOSIS — Z87442 Personal history of urinary calculi: Secondary | ICD-10-CM | POA: Insufficient documentation

## 2019-04-29 DIAGNOSIS — K219 Gastro-esophageal reflux disease without esophagitis: Secondary | ICD-10-CM | POA: Insufficient documentation

## 2019-04-29 DIAGNOSIS — M129 Arthropathy, unspecified: Secondary | ICD-10-CM | POA: Insufficient documentation

## 2019-04-29 DIAGNOSIS — Z79899 Other long term (current) drug therapy: Secondary | ICD-10-CM | POA: Insufficient documentation

## 2019-04-29 DIAGNOSIS — E785 Hyperlipidemia, unspecified: Secondary | ICD-10-CM | POA: Diagnosis not present

## 2019-04-29 DIAGNOSIS — E1122 Type 2 diabetes mellitus with diabetic chronic kidney disease: Secondary | ICD-10-CM | POA: Insufficient documentation

## 2019-04-29 DIAGNOSIS — R221 Localized swelling, mass and lump, neck: Secondary | ICD-10-CM | POA: Diagnosis not present

## 2019-04-29 DIAGNOSIS — N184 Chronic kidney disease, stage 4 (severe): Secondary | ICD-10-CM | POA: Diagnosis not present

## 2019-04-29 DIAGNOSIS — I129 Hypertensive chronic kidney disease with stage 1 through stage 4 chronic kidney disease, or unspecified chronic kidney disease: Secondary | ICD-10-CM | POA: Diagnosis not present

## 2019-04-29 DIAGNOSIS — R2681 Unsteadiness on feet: Secondary | ICD-10-CM | POA: Diagnosis not present

## 2019-04-29 DIAGNOSIS — K2940 Chronic atrophic gastritis without bleeding: Secondary | ICD-10-CM | POA: Insufficient documentation

## 2019-04-29 DIAGNOSIS — E538 Deficiency of other specified B group vitamins: Secondary | ICD-10-CM | POA: Diagnosis not present

## 2019-04-29 LAB — CBC WITH DIFFERENTIAL/PLATELET
Abs Immature Granulocytes: 0.01 10*3/uL (ref 0.00–0.07)
Basophils Absolute: 0 10*3/uL (ref 0.0–0.1)
Basophils Relative: 0 %
Eosinophils Absolute: 0.1 10*3/uL (ref 0.0–0.5)
Eosinophils Relative: 3 %
HCT: 30.8 % — ABNORMAL LOW (ref 36.0–46.0)
Hemoglobin: 10.1 g/dL — ABNORMAL LOW (ref 12.0–15.0)
Immature Granulocytes: 0 %
Lymphocytes Relative: 25 %
Lymphs Abs: 0.8 10*3/uL (ref 0.7–4.0)
MCH: 31.5 pg (ref 26.0–34.0)
MCHC: 32.8 g/dL (ref 30.0–36.0)
MCV: 96 fL (ref 80.0–100.0)
Monocytes Absolute: 0.2 10*3/uL (ref 0.1–1.0)
Monocytes Relative: 7 %
Neutro Abs: 2.1 10*3/uL (ref 1.7–7.7)
Neutrophils Relative %: 65 %
Platelets: 75 10*3/uL — ABNORMAL LOW (ref 150–400)
RBC: 3.21 MIL/uL — ABNORMAL LOW (ref 3.87–5.11)
RDW: 11.9 % (ref 11.5–15.5)
WBC: 3.2 10*3/uL — ABNORMAL LOW (ref 4.0–10.5)
nRBC: 0 % (ref 0.0–0.2)

## 2019-04-29 LAB — COMPREHENSIVE METABOLIC PANEL
ALT: 45 U/L — ABNORMAL HIGH (ref 0–44)
AST: 41 U/L (ref 15–41)
Albumin: 3.9 g/dL (ref 3.5–5.0)
Alkaline Phosphatase: 66 U/L (ref 38–126)
Anion gap: 9 (ref 5–15)
BUN: 36 mg/dL — ABNORMAL HIGH (ref 8–23)
CO2: 24 mmol/L (ref 22–32)
Calcium: 9 mg/dL (ref 8.9–10.3)
Chloride: 108 mmol/L (ref 98–111)
Creatinine, Ser: 1.6 mg/dL — ABNORMAL HIGH (ref 0.44–1.00)
GFR calc Af Amer: 35 mL/min — ABNORMAL LOW (ref >60)
GFR calc non Af Amer: 30 mL/min — ABNORMAL LOW (ref >60)
Glucose, Bld: 86 mg/dL (ref 70–99)
Potassium: 3.9 mmol/L (ref 3.5–5.1)
Sodium: 141 mmol/L (ref 135–145)
Total Bilirubin: 0.5 mg/dL (ref 0.3–1.2)
Total Protein: 6.5 g/dL (ref 6.5–8.1)

## 2019-04-29 NOTE — Assessment & Plan Note (Addendum)
#  Stage IV recurrent adenocarcinoma the lung; EGFR mutated. On  osimertinib 80 mg [Feb 6th 2020]. NOV 10th 2020 -PET scan shows stable/slightly decreased right loculated pleural effusion; no obvious evidence of any hypermetabolic pleural plaques/lymphadenopathy.  Hypermetabolic lymph node stomach without any thickening-see below. STABLE.    # Continue Osimertinib 80 mg/day-patient tolerating fairly well without any major side effects. Will order scans at next visit.   # ITP- chronic- platelets 75; on promacta 25 mg/day. STABLE.    # right sided stroke/ seizures-stable on asprin/Plavix; f/u neurology; STABLE.   #S/p fall-mechanical.  Discussed fall precautions/risk of bleeding on antiplatelet therapy.  # left neck mass- ~1-2cm; monitor for now.  # CKD- stage IV Creatinine 1.6 stable.  # Palliative care evaluation: Introduced palliative care philosophy and services. I discussed the need for palliative care evaluation/symptom management to help quality of life in the context of incurable disease.  Patient is interested; will make referral.   # DISPOSITION: # Stacy Cook in 4 weeks.  # follow up in 4 weeks;  MD;labs- cbc/cmp; Dr.B
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
## 2019-05-01 ENCOUNTER — Encounter: Payer: Self-pay | Admitting: *Deleted

## 2019-05-07 MED FILL — PROMACTA 25 MG TABLET: 25 | 30 days supply | Qty: 30 | Fill #1

## 2019-05-28 ENCOUNTER — Inpatient Hospital Stay: Payer: Medicare PPO | Admitting: Hospice and Palliative Medicine

## 2019-05-28 ENCOUNTER — Inpatient Hospital Stay: Payer: Medicare PPO

## 2019-05-28 ENCOUNTER — Inpatient Hospital Stay: Payer: Medicare PPO | Admitting: Internal Medicine

## 2019-06-02 ENCOUNTER — Other Ambulatory Visit: Payer: Self-pay

## 2019-06-02 ENCOUNTER — Inpatient Hospital Stay (HOSPITAL_BASED_OUTPATIENT_CLINIC_OR_DEPARTMENT_OTHER): Payer: Medicare PPO | Admitting: Internal Medicine

## 2019-06-02 ENCOUNTER — Inpatient Hospital Stay: Payer: Medicare PPO

## 2019-06-02 ENCOUNTER — Inpatient Hospital Stay: Payer: Medicare PPO | Attending: Hospice and Palliative Medicine | Admitting: Hospice and Palliative Medicine

## 2019-06-02 DIAGNOSIS — Z85828 Personal history of other malignant neoplasm of skin: Secondary | ICD-10-CM | POA: Insufficient documentation

## 2019-06-02 DIAGNOSIS — Z7982 Long term (current) use of aspirin: Secondary | ICD-10-CM | POA: Insufficient documentation

## 2019-06-02 DIAGNOSIS — R221 Localized swelling, mass and lump, neck: Secondary | ICD-10-CM | POA: Diagnosis not present

## 2019-06-02 DIAGNOSIS — K219 Gastro-esophageal reflux disease without esophagitis: Secondary | ICD-10-CM | POA: Diagnosis not present

## 2019-06-02 DIAGNOSIS — E785 Hyperlipidemia, unspecified: Secondary | ICD-10-CM | POA: Diagnosis not present

## 2019-06-02 DIAGNOSIS — E1122 Type 2 diabetes mellitus with diabetic chronic kidney disease: Secondary | ICD-10-CM | POA: Insufficient documentation

## 2019-06-02 DIAGNOSIS — C3431 Malignant neoplasm of lower lobe, right bronchus or lung: Secondary | ICD-10-CM | POA: Insufficient documentation

## 2019-06-02 DIAGNOSIS — Z79899 Other long term (current) drug therapy: Secondary | ICD-10-CM | POA: Insufficient documentation

## 2019-06-02 DIAGNOSIS — M129 Arthropathy, unspecified: Secondary | ICD-10-CM | POA: Diagnosis not present

## 2019-06-02 DIAGNOSIS — D693 Immune thrombocytopenic purpura: Secondary | ICD-10-CM | POA: Diagnosis not present

## 2019-06-02 DIAGNOSIS — H8110 Benign paroxysmal vertigo, unspecified ear: Secondary | ICD-10-CM | POA: Insufficient documentation

## 2019-06-02 DIAGNOSIS — Z515 Encounter for palliative care: Secondary | ICD-10-CM | POA: Diagnosis not present

## 2019-06-02 DIAGNOSIS — N184 Chronic kidney disease, stage 4 (severe): Secondary | ICD-10-CM | POA: Insufficient documentation

## 2019-06-02 DIAGNOSIS — I129 Hypertensive chronic kidney disease with stage 1 through stage 4 chronic kidney disease, or unspecified chronic kidney disease: Secondary | ICD-10-CM | POA: Insufficient documentation

## 2019-06-02 DIAGNOSIS — I2510 Atherosclerotic heart disease of native coronary artery without angina pectoris: Secondary | ICD-10-CM | POA: Diagnosis not present

## 2019-06-02 DIAGNOSIS — Z8673 Personal history of transient ischemic attack (TIA), and cerebral infarction without residual deficits: Secondary | ICD-10-CM | POA: Insufficient documentation

## 2019-06-02 DIAGNOSIS — Z7189 Other specified counseling: Secondary | ICD-10-CM

## 2019-06-02 LAB — CBC WITH DIFFERENTIAL/PLATELET
Abs Immature Granulocytes: 0.01 10*3/uL (ref 0.00–0.07)
Basophils Absolute: 0 10*3/uL (ref 0.0–0.1)
Basophils Relative: 0 %
Eosinophils Absolute: 0.1 10*3/uL (ref 0.0–0.5)
Eosinophils Relative: 2 %
HCT: 32.8 % — ABNORMAL LOW (ref 36.0–46.0)
Hemoglobin: 10.9 g/dL — ABNORMAL LOW (ref 12.0–15.0)
Immature Granulocytes: 0 %
Lymphocytes Relative: 23 %
Lymphs Abs: 0.9 10*3/uL (ref 0.7–4.0)
MCH: 30.9 pg (ref 26.0–34.0)
MCHC: 33.2 g/dL (ref 30.0–36.0)
MCV: 92.9 fL (ref 80.0–100.0)
Monocytes Absolute: 0.3 10*3/uL (ref 0.1–1.0)
Monocytes Relative: 7 %
Neutro Abs: 2.6 10*3/uL (ref 1.7–7.7)
Neutrophils Relative %: 68 %
Platelets: 77 10*3/uL — ABNORMAL LOW (ref 150–400)
RBC: 3.53 MIL/uL — ABNORMAL LOW (ref 3.87–5.11)
RDW: 11.9 % (ref 11.5–15.5)
WBC: 3.9 10*3/uL — ABNORMAL LOW (ref 4.0–10.5)
nRBC: 0 % (ref 0.0–0.2)

## 2019-06-02 LAB — COMPREHENSIVE METABOLIC PANEL
ALT: 31 U/L (ref 0–44)
AST: 27 U/L (ref 15–41)
Albumin: 3.9 g/dL (ref 3.5–5.0)
Alkaline Phosphatase: 67 U/L (ref 38–126)
Anion gap: 10 (ref 5–15)
BUN: 30 mg/dL — ABNORMAL HIGH (ref 8–23)
CO2: 24 mmol/L (ref 22–32)
Calcium: 9 mg/dL (ref 8.9–10.3)
Chloride: 108 mmol/L (ref 98–111)
Creatinine, Ser: 1.56 mg/dL — ABNORMAL HIGH (ref 0.44–1.00)
GFR calc Af Amer: 36 mL/min — ABNORMAL LOW (ref >60)
GFR calc non Af Amer: 31 mL/min — ABNORMAL LOW (ref >60)
Glucose, Bld: 93 mg/dL (ref 70–99)
Potassium: 4 mmol/L (ref 3.5–5.1)
Sodium: 142 mmol/L (ref 135–145)
Total Bilirubin: 0.5 mg/dL (ref 0.3–1.2)
Total Protein: 6.6 g/dL (ref 6.5–8.1)

## 2019-06-02 MED ORDER — MECLIZINE HCL 25 MG PO TABS: 25.0000 mg | ORAL_TABLET | Freq: Three times a day (TID) | ORAL | 0 refills | Status: DC | PRN
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
## 2019-06-02 NOTE — Assessment & Plan Note (Addendum)
#  Stage IV recurrent adenocarcinoma the lung; EGFR mutated. On  osimertinib 80 mg [Feb 6th 2020]. NOV 10th 2020 -PET scan shows stable/slightly decreased right loculated pleural effusion; no obvious evidence of any hypermetabolic pleural plaques/lymphadenopathy.  Hypermetabolic lymph node stomach without any thickening-see below. Clinically STABLE  # Continue Osimertinib 80 mg/day-patient tolerating fairly well without any major side effects. Will order scans today.   # ITP- chronic- platelets 77; on promacta 25 mg/day. Stable.   # right sided stroke/ seizures-stable on asprin/Plavix; f/u neurology; STABLE.   # Dizzy spells- ? BPPV [not positional]- recommend a trial of anti-vert. If not better defer to PCP.   # left neck mass- ~1-2cm; stable; monitor for now; await scan as above.  # CKD- stage IV Creatinine 1.56- stable.  # Palliative care evaluation: awaiting today.   # DISPOSITION: # follow up in 4 weeks;  MD;labs- cbc/cmp;PET prior-; Dr.B
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
## 2019-06-03 ENCOUNTER — Telehealth: Payer: Self-pay | Admitting: Pharmacy Technician

## 2019-06-03 NOTE — Telephone Encounter (Signed)
Oral Oncology Patient Advocate Encounter   Received notification from [facility] that prior authorization for Promacta is required.   PA submitted on CoverMyMeds Key [id number] Status is pending   Oral Oncology Clinic will continue to follow.  [place] Patient [place] Phone [phone] Fax [phone] 06/04/2019 9:54 AM

## 2019-06-04 NOTE — Telephone Encounter (Signed)
Oral Oncology Patient Advocate Encounter  Prior Authorization for Stacy Cook has been approved.    PA# [id number] Effective dates: 06/04/2019 through 12/01/2019  Patients co-pay is $100.00.  Oral Oncology Clinic will continue to follow.   [name] Phone [phone] Fax [phone] 06/04/2019 9:56 AM

## 2019-06-10 ENCOUNTER — Ambulatory Visit: Payer: Medicare PPO | Admitting: Adult Health

## 2019-06-10 ENCOUNTER — Encounter: Payer: Self-pay | Admitting: Adult Health

## 2019-06-10 ENCOUNTER — Other Ambulatory Visit: Payer: Self-pay

## 2019-06-10 VITALS — BP 114/42 | HR 60 | Temp 97.1°F | Ht 61.0 in | Wt 103.8 lb

## 2019-06-10 DIAGNOSIS — I10 Essential (primary) hypertension: Secondary | ICD-10-CM

## 2019-06-10 DIAGNOSIS — G8191 Hemiplegia, unspecified affecting right dominant side: Secondary | ICD-10-CM

## 2019-06-10 DIAGNOSIS — E785 Hyperlipidemia, unspecified: Secondary | ICD-10-CM | POA: Diagnosis not present

## 2019-06-10 DIAGNOSIS — I2510 Atherosclerotic heart disease of native coronary artery without angina pectoris: Secondary | ICD-10-CM

## 2019-06-10 DIAGNOSIS — Z9861 Coronary angioplasty status: Secondary | ICD-10-CM

## 2019-06-10 DIAGNOSIS — I63411 Cerebral infarction due to embolism of right middle cerebral artery: Secondary | ICD-10-CM | POA: Diagnosis not present

## 2019-06-10 DIAGNOSIS — Z952 Presence of prosthetic heart valve: Secondary | ICD-10-CM

## 2019-06-10 MED ORDER — LEVETIRACETAM 500 MG PO TABS: 500.0000 mg | ORAL_TABLET | Freq: Every day | ORAL | 0 refills | Status: DC

## 2019-06-10 NOTE — Progress Notes (Signed)
I agree with the above plan 
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
## 2019-06-10 NOTE — Patient Instructions (Addendum)
We can decreased keppra dosage from 500mg  nightly for 1 month and then you can stop - please let us know if you experience any type of seizure activity  Continue aspirin 81 mg daily and clopidogrel 75 mg daily  for secondary stroke prevention  Continue to follow up with PCP regarding cholesterol and blood pressure management   Continue to follow with Dr. Rayann Heman for on going follow up and monitoring as well as speak with him about possible peripheral arterial disease  You likely have a sebaceous cyst on the back of your neck that could be pressing on your occipital nerve causing pain. If this worsens, speak with your PCP about possibly having cyst removed  Continue to monitor blood pressure at home  Maintain strict control of hypertension with blood pressure goal below 130/90, diabetes with hemoglobin A1c goal below 6.5% and cholesterol with LDL cholesterol (bad cholesterol) goal below 70 mg/dL. I also advised the patient to eat a healthy diet with plenty of whole grains, cereals, fruits and vegetables, exercise regularly and maintain ideal body weight.  Followup in the future with me in 6 months or call earlier if needed       Thank you for coming to see [place] at [facility]. I hope we have been able to provide you high quality care today.  You may receive a patient satisfaction survey over the next few weeks. We would appreciate your feedback and comments so that we may continue to improve ourselves and the health of our patients.

## 2019-06-11 ENCOUNTER — Other Ambulatory Visit (INDEPENDENT_AMBULATORY_CARE_PROVIDER_SITE_OTHER): Payer: Self-pay | Admitting: Vascular Surgery

## 2019-06-11 ENCOUNTER — Telehealth (INDEPENDENT_AMBULATORY_CARE_PROVIDER_SITE_OTHER): Payer: Self-pay | Admitting: Vascular Surgery

## 2019-06-11 DIAGNOSIS — R209 Unspecified disturbances of skin sensation: Secondary | ICD-10-CM

## 2019-06-11 NOTE — Telephone Encounter (Signed)
-----   Message from Algernon Huxley, MD sent at 06/11/2019  8:16 AM EST ----- Sounds like we need to bring this lady in for ABIs.

## 2019-06-12 ENCOUNTER — Ambulatory Visit (INDEPENDENT_AMBULATORY_CARE_PROVIDER_SITE_OTHER): Payer: Medicare PPO

## 2019-06-12 ENCOUNTER — Ambulatory Visit (INDEPENDENT_AMBULATORY_CARE_PROVIDER_SITE_OTHER): Payer: Medicare PPO | Admitting: Nurse Practitioner

## 2019-06-12 ENCOUNTER — Encounter (INDEPENDENT_AMBULATORY_CARE_PROVIDER_SITE_OTHER): Payer: Self-pay | Admitting: Nurse Practitioner

## 2019-06-12 ENCOUNTER — Other Ambulatory Visit: Payer: Self-pay

## 2019-06-12 VITALS — BP 115/56 | HR 60 | Resp 16

## 2019-06-12 DIAGNOSIS — R209 Unspecified disturbances of skin sensation: Secondary | ICD-10-CM | POA: Diagnosis not present

## 2019-06-12 DIAGNOSIS — I6523 Occlusion and stenosis of bilateral carotid arteries: Secondary | ICD-10-CM | POA: Diagnosis not present

## 2019-06-12 MED FILL — PROMACTA 25 MG TABLET: 25 | 30 days supply | Qty: 30 | Fill #2
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
## 2019-06-18 ENCOUNTER — Ambulatory Visit (INDEPENDENT_AMBULATORY_CARE_PROVIDER_SITE_OTHER): Payer: Medicare PPO | Admitting: *Deleted

## 2019-06-18 DIAGNOSIS — I459 Conduction disorder, unspecified: Secondary | ICD-10-CM | POA: Diagnosis not present

## 2019-06-18 LAB — CUP PACEART REMOTE DEVICE CHECK
Battery Remaining Longevity: 96 mo
Battery Voltage: 3.06 V
Brady Statistic AS VP Percent: 62.33 %
Brady Statistic AS VS Percent: 0 %
Brady Statistic RV Percent Paced: 99.92 %
Date Time Interrogation Session: 20210311151900
Implantable Pulse Generator Implant Date: 20200901
Lead Channel Impedance Value: 560 Ohm
Lead Channel Pacing Threshold Amplitude: 0.5 V
Lead Channel Pacing Threshold Pulse Width: 0.24 ms
Lead Channel Sensing Intrinsic Amplitude: 8.55 mV
Lead Channel Setting Pacing Amplitude: 1 V
Lead Channel Setting Pacing Pulse Width: 0.24 ms
Lead Channel Setting Sensing Sensitivity: 2.8 mV

## 2019-06-19 NOTE — Progress Notes (Signed)
PPM Remote  

## 2019-06-22 ENCOUNTER — Telehealth: Payer: Self-pay | Admitting: Internal Medicine

## 2019-06-22 ENCOUNTER — Other Ambulatory Visit: Payer: Self-pay

## 2019-06-22 ENCOUNTER — Encounter
Admission: RE | Admit: 2019-06-22 | Discharge: 2019-06-22 | Disposition: A | Discharge status: Home / Self Care | Payer: Medicare PPO | Source: Ambulatory Visit | Attending: Internal Medicine | Admitting: Internal Medicine

## 2019-06-22 DIAGNOSIS — C3431 Malignant neoplasm of lower lobe, right bronchus or lung: Secondary | ICD-10-CM | POA: Insufficient documentation

## 2019-06-22 DIAGNOSIS — J90 Pleural effusion, not elsewhere classified: Secondary | ICD-10-CM | POA: Insufficient documentation

## 2019-06-22 DIAGNOSIS — Z902 Acquired absence of lung [part of]: Secondary | ICD-10-CM | POA: Insufficient documentation

## 2019-06-22 DIAGNOSIS — N200 Calculus of kidney: Secondary | ICD-10-CM | POA: Insufficient documentation

## 2019-06-22 DIAGNOSIS — J439 Emphysema, unspecified: Secondary | ICD-10-CM | POA: Insufficient documentation

## 2019-06-22 DIAGNOSIS — I2510 Atherosclerotic heart disease of native coronary artery without angina pectoris: Secondary | ICD-10-CM | POA: Diagnosis not present

## 2019-06-22 DIAGNOSIS — I700 Atherosclerosis of aorta: Secondary | ICD-10-CM | POA: Diagnosis not present

## 2019-06-22 DIAGNOSIS — Z903 Acquired absence of stomach [part of]: Secondary | ICD-10-CM | POA: Diagnosis not present

## 2019-06-22 LAB — GLUCOSE, CAPILLARY: Glucose-Capillary: 71 mg/dL (ref 70–99)

## 2019-06-22 MED ORDER — FLUDEOXYGLUCOSE F - 18 (FDG) INJECTION
5.2000 | Freq: Once | INTRAVENOUS | Status: AC | PRN
  Administered 2019-06-22: 5.42 via INTRAVENOUS

## 2019-06-22 NOTE — Telephone Encounter (Signed)
Reviewed the improved results of PET scan with pt; follow up as planned.

## 2019-06-30 ENCOUNTER — Other Ambulatory Visit: Payer: Self-pay

## 2019-06-30 ENCOUNTER — Inpatient Hospital Stay: Payer: Medicare PPO | Admitting: Hospice and Palliative Medicine

## 2019-06-30 ENCOUNTER — Inpatient Hospital Stay (HOSPITAL_BASED_OUTPATIENT_CLINIC_OR_DEPARTMENT_OTHER): Payer: Medicare PPO | Admitting: Internal Medicine

## 2019-06-30 ENCOUNTER — Inpatient Hospital Stay: Payer: Medicare PPO | Attending: Internal Medicine

## 2019-06-30 VITALS — BP 143/66 | HR 70 | Temp 96.3°F | Resp 20 | Ht 61.0 in | Wt 106.0 lb

## 2019-06-30 DIAGNOSIS — R569 Unspecified convulsions: Secondary | ICD-10-CM | POA: Insufficient documentation

## 2019-06-30 DIAGNOSIS — M5136 Other intervertebral disc degeneration, lumbar region: Secondary | ICD-10-CM | POA: Diagnosis not present

## 2019-06-30 DIAGNOSIS — E538 Deficiency of other specified B group vitamins: Secondary | ICD-10-CM | POA: Insufficient documentation

## 2019-06-30 DIAGNOSIS — C3431 Malignant neoplasm of lower lobe, right bronchus or lung: Secondary | ICD-10-CM | POA: Diagnosis not present

## 2019-06-30 DIAGNOSIS — Z7902 Long term (current) use of antithrombotics/antiplatelets: Secondary | ICD-10-CM | POA: Insufficient documentation

## 2019-06-30 DIAGNOSIS — Z79899 Other long term (current) drug therapy: Secondary | ICD-10-CM | POA: Diagnosis not present

## 2019-06-30 DIAGNOSIS — M129 Arthropathy, unspecified: Secondary | ICD-10-CM | POA: Diagnosis not present

## 2019-06-30 DIAGNOSIS — I2510 Atherosclerotic heart disease of native coronary artery without angina pectoris: Secondary | ICD-10-CM | POA: Diagnosis not present

## 2019-06-30 DIAGNOSIS — I10 Essential (primary) hypertension: Secondary | ICD-10-CM | POA: Insufficient documentation

## 2019-06-30 DIAGNOSIS — E785 Hyperlipidemia, unspecified: Secondary | ICD-10-CM | POA: Diagnosis not present

## 2019-06-30 DIAGNOSIS — K2940 Chronic atrophic gastritis without bleeding: Secondary | ICD-10-CM | POA: Diagnosis not present

## 2019-06-30 DIAGNOSIS — Z85020 Personal history of malignant carcinoid tumor of stomach: Secondary | ICD-10-CM | POA: Diagnosis not present

## 2019-06-30 DIAGNOSIS — R221 Localized swelling, mass and lump, neck: Secondary | ICD-10-CM | POA: Insufficient documentation

## 2019-06-30 DIAGNOSIS — Z7982 Long term (current) use of aspirin: Secondary | ICD-10-CM | POA: Diagnosis not present

## 2019-06-30 DIAGNOSIS — I129 Hypertensive chronic kidney disease with stage 1 through stage 4 chronic kidney disease, or unspecified chronic kidney disease: Secondary | ICD-10-CM | POA: Insufficient documentation

## 2019-06-30 DIAGNOSIS — N184 Chronic kidney disease, stage 4 (severe): Secondary | ICD-10-CM | POA: Insufficient documentation

## 2019-06-30 DIAGNOSIS — Z85028 Personal history of other malignant neoplasm of stomach: Secondary | ICD-10-CM | POA: Diagnosis not present

## 2019-06-30 DIAGNOSIS — E1122 Type 2 diabetes mellitus with diabetic chronic kidney disease: Secondary | ICD-10-CM | POA: Insufficient documentation

## 2019-06-30 DIAGNOSIS — C160 Malignant neoplasm of cardia: Secondary | ICD-10-CM

## 2019-06-30 DIAGNOSIS — Z87442 Personal history of urinary calculi: Secondary | ICD-10-CM | POA: Diagnosis not present

## 2019-06-30 DIAGNOSIS — J90 Pleural effusion, not elsewhere classified: Secondary | ICD-10-CM | POA: Insufficient documentation

## 2019-06-30 DIAGNOSIS — M810 Age-related osteoporosis without current pathological fracture: Secondary | ICD-10-CM | POA: Diagnosis not present

## 2019-06-30 DIAGNOSIS — R1013 Epigastric pain: Secondary | ICD-10-CM | POA: Insufficient documentation

## 2019-06-30 DIAGNOSIS — K219 Gastro-esophageal reflux disease without esophagitis: Secondary | ICD-10-CM | POA: Insufficient documentation

## 2019-06-30 DIAGNOSIS — D693 Immune thrombocytopenic purpura: Secondary | ICD-10-CM | POA: Diagnosis not present

## 2019-06-30 LAB — COMPREHENSIVE METABOLIC PANEL
ALT: 13 U/L (ref 0–44)
AST: 18 U/L (ref 15–41)
Albumin: 4.1 g/dL (ref 3.5–5.0)
Alkaline Phosphatase: 63 U/L (ref 38–126)
Anion gap: 9 (ref 5–15)
BUN: 30 mg/dL — ABNORMAL HIGH (ref 8–23)
CO2: 24 mmol/L (ref 22–32)
Calcium: 8.7 mg/dL — ABNORMAL LOW (ref 8.9–10.3)
Chloride: 107 mmol/L (ref 98–111)
Creatinine, Ser: 1.57 mg/dL — ABNORMAL HIGH (ref 0.44–1.00)
GFR calc Af Amer: 35 mL/min — ABNORMAL LOW (ref >60)
GFR calc non Af Amer: 31 mL/min — ABNORMAL LOW (ref >60)
Glucose, Bld: 88 mg/dL (ref 70–99)
Potassium: 3.7 mmol/L (ref 3.5–5.1)
Sodium: 140 mmol/L (ref 135–145)
Total Bilirubin: 0.8 mg/dL (ref 0.3–1.2)
Total Protein: 6.4 g/dL — ABNORMAL LOW (ref 6.5–8.1)

## 2019-06-30 LAB — CBC WITH DIFFERENTIAL/PLATELET
Abs Immature Granulocytes: 0.01 10*3/uL (ref 0.00–0.07)
Basophils Absolute: 0 10*3/uL (ref 0.0–0.1)
Basophils Relative: 1 %
Eosinophils Absolute: 0.1 10*3/uL (ref 0.0–0.5)
Eosinophils Relative: 2 %
HCT: 30.7 % — ABNORMAL LOW (ref 36.0–46.0)
Hemoglobin: 10.3 g/dL — ABNORMAL LOW (ref 12.0–15.0)
Immature Granulocytes: 0 %
Lymphocytes Relative: 27 %
Lymphs Abs: 0.9 10*3/uL (ref 0.7–4.0)
MCH: 30.5 pg (ref 26.0–34.0)
MCHC: 33.6 g/dL (ref 30.0–36.0)
MCV: 90.8 fL (ref 80.0–100.0)
Monocytes Absolute: 0.3 10*3/uL (ref 0.1–1.0)
Monocytes Relative: 8 %
Neutro Abs: 2.2 10*3/uL (ref 1.7–7.7)
Neutrophils Relative %: 62 %
Platelets: 90 10*3/uL — ABNORMAL LOW (ref 150–400)
RBC: 3.38 MIL/uL — ABNORMAL LOW (ref 3.87–5.11)
RDW: 12.2 % (ref 11.5–15.5)
WBC: 3.5 10*3/uL — ABNORMAL LOW (ref 4.0–10.5)
nRBC: 0 % (ref 0.0–0.2)
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
## 2019-06-30 NOTE — Assessment & Plan Note (Addendum)
#  Stage IV recurrent adenocarcinoma the lung; EGFR mutated. On  osimertinib 80 mg [Feb 6th 2020]. MARCH 2021- PET scan shows stable/small right-sided pleural effusion; no hypermetabolic noted; mild hypermetabolic noted in the stomach [see below] clinically STABLE  # Continue Osimertinib 80 mg/day-patient tolerating fairly well without any major side effects.   # ITP- chronic- platelets90 on promacta 25 mg/day.  Stable  #Abdominal pain-unclear etiology; mild uptake noted on PET scan [however improved compared to previous scan in November]; last EGD December 2018.  Recommend evaluation with Dr. Alice Reichert  # right sided stroke/ seizures-stable on asprin/Plavix; f/u neurology; stable  # left neck mass- ~1-2cm; stable; monitor for now- PET no uptake.   # CKD- stage IV Creatinine 1.56-stable  # DISPOSITION: # Referral to Dr.Toledo- re: hx of stomach cancer- # follow up in 4 weeks;  MD;labs- cbc/cmp;Dr.B  # I reviewed the blood work- with the patient in detail; also reviewed the imaging independently [as summarized above]; and with the patient in detail.

## 2019-07-06 ENCOUNTER — Other Ambulatory Visit: Payer: Self-pay | Admitting: *Deleted

## 2019-07-06 ENCOUNTER — Other Ambulatory Visit: Payer: Self-pay | Admitting: Internal Medicine

## 2019-07-06 DIAGNOSIS — C160 Malignant neoplasm of cardia: Secondary | ICD-10-CM

## 2019-07-09 ENCOUNTER — Telehealth: Payer: Self-pay | Admitting: *Deleted

## 2019-07-09 NOTE — Telephone Encounter (Signed)
Chart review- GI referral is scheduled for C. Jacqulyn Liner, NP for 07/14/19

## 2019-07-14 ENCOUNTER — Other Ambulatory Visit: Payer: Self-pay | Admitting: *Deleted

## 2019-07-14 ENCOUNTER — Other Ambulatory Visit: Payer: Self-pay | Admitting: Gastroenterology

## 2019-07-14 DIAGNOSIS — R1901 Right upper quadrant abdominal swelling, mass and lump: Secondary | ICD-10-CM

## 2019-07-14 DIAGNOSIS — R1031 Right lower quadrant pain: Secondary | ICD-10-CM

## 2019-07-14 MED ORDER — OSIMERTINIB MESYLATE 80 MG PO TABS: 80.0000 mg | ORAL_TABLET | Freq: Every day | ORAL | 6 refills | Status: DC

## 2019-07-14 NOTE — Telephone Encounter (Signed)
Patient reports that she is almost out of [place]. She needs a RF submitted asap.

## 2019-07-16 MED FILL — PROMACTA 25 MG TABLET: 25 | 30 days supply | Qty: 30 | Fill #3

## 2019-07-21 ENCOUNTER — Ambulatory Visit
Admission: RE | Admit: 2019-07-21 | Discharge: 2019-07-21 | Disposition: A | Discharge status: Home / Self Care | Payer: Medicare PPO | Source: Ambulatory Visit | Attending: Gastroenterology | Admitting: Gastroenterology

## 2019-07-21 ENCOUNTER — Other Ambulatory Visit: Payer: Self-pay

## 2019-07-21 DIAGNOSIS — R1031 Right lower quadrant pain: Secondary | ICD-10-CM | POA: Insufficient documentation

## 2019-07-21 DIAGNOSIS — R1901 Right upper quadrant abdominal swelling, mass and lump: Secondary | ICD-10-CM | POA: Diagnosis present

## 2019-07-24 ENCOUNTER — Encounter: Payer: Self-pay | Admitting: Urology

## 2019-07-24 ENCOUNTER — Ambulatory Visit: Payer: Medicare PPO | Admitting: Urology

## 2019-07-24 ENCOUNTER — Other Ambulatory Visit: Payer: Self-pay | Admitting: Radiology

## 2019-07-24 ENCOUNTER — Telehealth: Payer: Self-pay | Admitting: *Deleted

## 2019-07-24 ENCOUNTER — Other Ambulatory Visit: Payer: Self-pay

## 2019-07-24 VITALS — BP 105/57 | HR 97 | Ht 61.0 in | Wt 102.0 lb

## 2019-07-24 DIAGNOSIS — N1832 Chronic kidney disease, stage 3b: Secondary | ICD-10-CM

## 2019-07-24 DIAGNOSIS — N200 Calculus of kidney: Secondary | ICD-10-CM
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
## 2019-07-24 NOTE — Patient Instructions (Signed)
Ureteroscopy Ureteroscopy is a procedure to check for and treat problems inside part of the urinary tract. In this procedure, a thin, tube-shaped instrument with a light at the end (ureteroscope) is used to look at the inside of the kidneys and the ureters, which are the tubes that carry urine from the kidneys to the bladder. The ureteroscope is inserted into one or both of the ureters. You may need this procedure if you have frequent urinary tract infections (UTIs), blood in your urine, or a stone in one of your ureters. A ureteroscopy can be done to find the cause of urine blockage in a ureter and to evaluate other abnormalities inside the ureters or kidneys. If stones are found, they can be removed during the procedure. Polyps, abnormal tissue, and some types of tumors can also be removed or treated. The ureteroscope may also have a tool to remove tissue to be checked for disease under a microscope (biopsy). Tell a health care provider about:  Any allergies you have.  All medicines you are taking, including vitamins, herbs, eye drops, creams, and over-the-counter medicines.  Any problems you or family members have had with anesthetic medicines.  Any blood disorders you have.  Any surgeries you have had.  Any medical conditions you have.  Whether you are pregnant or may be pregnant. What are the risks? Generally, this is a safe procedure. However, problems may occur, including:  Bleeding.  Infection.  Allergic reactions to medicines.  Scarring that narrows the ureter (stricture).  Creating a hole in the ureter (perforation). What happens before the procedure? Staying hydrated Follow instructions from your health care provider about hydration, which may include:  Up to 2 hours before the procedure - you may continue to drink clear liquids, such as water, clear fruit juice, black coffee, and plain tea. Eating and drinking restrictions Follow instructions from your health care  provider about eating and drinking, which may include:  8 hours before the procedure - stop eating heavy meals or foods such as meat, fried foods, or fatty foods.  6 hours before the procedure - stop eating light meals or foods, such as toast or cereal.  6 hours before the procedure - stop drinking milk or drinks that contain milk.  2 hours before the procedure - stop drinking clear liquids. Medicines  Ask your health care provider about: ? Changing or stopping your regular medicines. This is especially important if you are taking diabetes medicines or blood thinners. ? Taking medicines such as aspirin and ibuprofen. These medicines can thin your blood. Do not take these medicines before your procedure if your health care provider instructs you not to.  You may be given antibiotic medicine to help prevent infection. General instructions  You may have a urine sample taken to check for infection.  Plan to have someone take you home from the hospital or clinic. What happens during the procedure?   To reduce your risk of infection: ? Your health care team will wash or sanitize their hands. ? Your skin will be washed with soap.  An IV tube will be inserted into one of your veins.  You will be given one of the following: ? A medicine to help you relax (sedative). ? A medicine to make you fall asleep (general anesthetic). ? A medicine that is injected into your spine to numb the area below and slightly above the injection site (spinal anesthetic).  To lower your risk of infection, you may be given an antibiotic medicine   by an injection or through the IV tube.  The opening from which you urinate (urethra) will be cleaned with a germ-killing solution.  The ureteroscope will be passed through your urethra into your bladder.  A salt-water solution will flow through the ureteroscope to fill your bladder. This will help the health care provider see the openings of your ureters more  clearly.  Then, the ureteroscope will be passed into your ureter. ? If a growth is found, a piece of it may be removed so it can be examined under a microscope (biopsy). ? If a stone is found, it may be removed through the ureteroscope, or the stone may be broken up using a laser, shock waves, or electrical energy. ? In some cases, if the ureter is too small, a tube may be inserted that keeps the ureter open (ureteral stent). The stent may be left in place for 1 or 2 weeks to keep the ureter open, and then the ureteroscopy procedure will be performed.  The scope will be removed, and your bladder will be emptied. The procedure may vary among health care providers and hospitals. What happens after the procedure?  Your blood pressure, heart rate, breathing rate, and blood oxygen level will be monitored until the medicines you were given have worn off.  You may be asked to urinate.  Donot drive for 24 hours if you were given a sedative. This information is not intended to replace advice given to you by your health care provider. Make sure you discuss any questions you have with your health care provider. Document Revised: 03/08/2017 Document Reviewed: 01/06/2016 Elsevier Patient Education  2020 Elsevier Inc[employer]  

## 2019-07-24 NOTE — Telephone Encounter (Signed)
   [facility] Pre-operative Risk Assessment    Request for surgical clearance:  1. What type of surgery is being performed? LEFT URETEROSCOPY, LASER LITHOTRIPSY, URETERAL STENT PLACEMENT    2. When is this surgery scheduled? 08/03/19   3. What type of clearance is required (medical clearance vs. Pharmacy clearance to hold med vs. Both)? MEDICAL AND DEVICE CLEARANCE (CLEARANCE FORM HAS BEEN GIVEN TO DEVICE)  4. Are there any medications that need to be held prior to surgery and how long? DR. Hollice Espy OK WITH CONTINUING ASA THOUGH DOES NEED TO HOLD PLAVIX (MINIUM 5 DAYS THOUGH PREFERS TO HOLD PLAVIX 7 DAYS)   5. Practice name and name of physician performing surgery? [facility];DR. Hollice Espy  6. What is your office phone number [phone]    7.   What is your office fax number [phone]  8.   Anesthesia type (None, local, MAC, general) ? GENERAL   Stacy Cook 07/24/2019, 4:56 PM  _________________________________________________________________   (provider comments below)

## 2019-07-27 NOTE — Telephone Encounter (Signed)
OK to hold Plavix. chris

## 2019-07-27 NOTE — Telephone Encounter (Signed)
   Primary Cardiologist: Dr. Angelena Form  History ofmultivessel CAD s/p PCI/DES 10/2017 to the distal RCA, and orbital atherectomy/DES to the [facility] (She was placed onDAPT with ASA/plavix), severe AS s/p TAVR (12/02/18) c/b acute CVA with right hemiparesis and CHB s/p leadless PPM (12/09/18).   Chart reviewed as part of pre-operative protocol coverage. Patient was contacted 07/27/2019 in reference to pre-operative risk assessment for pending surgery as outlined below.  Liylah Najarro Thilges was last seen on 03/19/19  by Dr. Rayann Heman.  Since that day, Verdie Wilms has done well. She is able to get at least 4 mets of activity.   Therefore, based on ACC/AHA guidelines, the patient would be at acceptable risk for the planned procedure without further cardiovascular testing.   Dr. Angelena Form, please give your recommendations regarding Plavix. Dr. Rayann Heman please give your recommendation regarding PPM.   Leanor Kail, PA 07/27/2019, 9:16 AM

## 2019-07-27 NOTE — Telephone Encounter (Signed)
5 days should be good. Gerald Stabs

## 2019-07-28 ENCOUNTER — Inpatient Hospital Stay (HOSPITAL_BASED_OUTPATIENT_CLINIC_OR_DEPARTMENT_OTHER): Payer: Medicare PPO | Admitting: Hospice and Palliative Medicine

## 2019-07-28 ENCOUNTER — Inpatient Hospital Stay: Payer: Medicare PPO | Attending: Internal Medicine

## 2019-07-28 ENCOUNTER — Inpatient Hospital Stay: Payer: Medicare PPO | Admitting: Internal Medicine

## 2019-07-28 ENCOUNTER — Other Ambulatory Visit: Payer: Self-pay

## 2019-07-28 ENCOUNTER — Inpatient Hospital Stay: Payer: Medicare PPO

## 2019-07-28 ENCOUNTER — Encounter: Payer: Self-pay | Admitting: Internal Medicine

## 2019-07-28 DIAGNOSIS — Z8719 Personal history of other diseases of the digestive system: Secondary | ICD-10-CM | POA: Diagnosis not present

## 2019-07-28 DIAGNOSIS — K219 Gastro-esophageal reflux disease without esophagitis: Secondary | ICD-10-CM | POA: Diagnosis not present

## 2019-07-28 DIAGNOSIS — E538 Deficiency of other specified B group vitamins: Secondary | ICD-10-CM | POA: Insufficient documentation

## 2019-07-28 DIAGNOSIS — N183 Chronic kidney disease, stage 3 unspecified: Secondary | ICD-10-CM | POA: Insufficient documentation

## 2019-07-28 DIAGNOSIS — I10 Essential (primary) hypertension: Secondary | ICD-10-CM | POA: Diagnosis not present

## 2019-07-28 DIAGNOSIS — N132 Hydronephrosis with renal and ureteral calculous obstruction: Secondary | ICD-10-CM | POA: Insufficient documentation

## 2019-07-28 DIAGNOSIS — Z515 Encounter for palliative care: Secondary | ICD-10-CM

## 2019-07-28 DIAGNOSIS — Z79899 Other long term (current) drug therapy: Secondary | ICD-10-CM | POA: Insufficient documentation

## 2019-07-28 DIAGNOSIS — Z7189 Other specified counseling: Secondary | ICD-10-CM

## 2019-07-28 DIAGNOSIS — Z87442 Personal history of urinary calculi: Secondary | ICD-10-CM | POA: Insufficient documentation

## 2019-07-28 DIAGNOSIS — K5900 Constipation, unspecified: Secondary | ICD-10-CM | POA: Diagnosis not present

## 2019-07-28 DIAGNOSIS — I2510 Atherosclerotic heart disease of native coronary artery without angina pectoris: Secondary | ICD-10-CM | POA: Diagnosis not present

## 2019-07-28 DIAGNOSIS — Z8673 Personal history of transient ischemic attack (TIA), and cerebral infarction without residual deficits: Secondary | ICD-10-CM | POA: Diagnosis not present

## 2019-07-28 DIAGNOSIS — C3431 Malignant neoplasm of lower lobe, right bronchus or lung: Secondary | ICD-10-CM | POA: Diagnosis not present

## 2019-07-28 DIAGNOSIS — M129 Arthropathy, unspecified: Secondary | ICD-10-CM | POA: Diagnosis not present

## 2019-07-28 DIAGNOSIS — I700 Atherosclerosis of aorta: Secondary | ICD-10-CM | POA: Diagnosis not present

## 2019-07-28 DIAGNOSIS — Z66 Do not resuscitate: Secondary | ICD-10-CM | POA: Diagnosis not present

## 2019-07-28 DIAGNOSIS — Z85038 Personal history of other malignant neoplasm of large intestine: Secondary | ICD-10-CM | POA: Insufficient documentation

## 2019-07-28 DIAGNOSIS — E860 Dehydration: Secondary | ICD-10-CM

## 2019-07-28 DIAGNOSIS — K2940 Chronic atrophic gastritis without bleeding: Secondary | ICD-10-CM | POA: Diagnosis not present

## 2019-07-28 DIAGNOSIS — I739 Peripheral vascular disease, unspecified: Secondary | ICD-10-CM | POA: Insufficient documentation

## 2019-07-28 DIAGNOSIS — Z87891 Personal history of nicotine dependence: Secondary | ICD-10-CM | POA: Diagnosis not present

## 2019-07-28 DIAGNOSIS — J90 Pleural effusion, not elsewhere classified: Secondary | ICD-10-CM | POA: Diagnosis not present

## 2019-07-28 DIAGNOSIS — I350 Nonrheumatic aortic (valve) stenosis: Secondary | ICD-10-CM | POA: Insufficient documentation

## 2019-07-28 DIAGNOSIS — E1122 Type 2 diabetes mellitus with diabetic chronic kidney disease: Secondary | ICD-10-CM | POA: Insufficient documentation

## 2019-07-28 DIAGNOSIS — Z7982 Long term (current) use of aspirin: Secondary | ICD-10-CM | POA: Insufficient documentation

## 2019-07-28 DIAGNOSIS — E785 Hyperlipidemia, unspecified: Secondary | ICD-10-CM | POA: Insufficient documentation

## 2019-07-28 DIAGNOSIS — E1151 Type 2 diabetes mellitus with diabetic peripheral angiopathy without gangrene: Secondary | ICD-10-CM | POA: Diagnosis not present

## 2019-07-28 LAB — COMPREHENSIVE METABOLIC PANEL
ALT: 8 U/L (ref 0–44)
AST: 14 U/L — ABNORMAL LOW (ref 15–41)
Albumin: 3.8 g/dL (ref 3.5–5.0)
Alkaline Phosphatase: 70 U/L (ref 38–126)
Anion gap: 10 (ref 5–15)
BUN: 56 mg/dL — ABNORMAL HIGH (ref 8–23)
CO2: 22 mmol/L (ref 22–32)
Calcium: 9 mg/dL (ref 8.9–10.3)
Chloride: 106 mmol/L (ref 98–111)
Creatinine, Ser: 2.93 mg/dL — ABNORMAL HIGH (ref 0.44–1.00)
GFR calc Af Amer: 17 mL/min — ABNORMAL LOW (ref >60)
GFR calc non Af Amer: 14 mL/min — ABNORMAL LOW (ref >60)
Glucose, Bld: 90 mg/dL (ref 70–99)
Potassium: 4.2 mmol/L (ref 3.5–5.1)
Sodium: 138 mmol/L (ref 135–145)
Total Bilirubin: 0.4 mg/dL (ref 0.3–1.2)
Total Protein: 6.9 g/dL (ref 6.5–8.1)

## 2019-07-28 LAB — CBC WITH DIFFERENTIAL/PLATELET
Abs Immature Granulocytes: 0.02 10*3/uL (ref 0.00–0.07)
Basophils Absolute: 0 10*3/uL (ref 0.0–0.1)
Basophils Relative: 0 %
Eosinophils Absolute: 0.2 10*3/uL (ref 0.0–0.5)
Eosinophils Relative: 3 %
HCT: 30.8 % — ABNORMAL LOW (ref 36.0–46.0)
Hemoglobin: 10 g/dL — ABNORMAL LOW (ref 12.0–15.0)
Immature Granulocytes: 0 %
Lymphocytes Relative: 25 %
Lymphs Abs: 1.3 10*3/uL (ref 0.7–4.0)
MCH: 29.2 pg (ref 26.0–34.0)
MCHC: 32.5 g/dL (ref 30.0–36.0)
MCV: 90.1 fL (ref 80.0–100.0)
Monocytes Absolute: 0.3 10*3/uL (ref 0.1–1.0)
Monocytes Relative: 6 %
Neutro Abs: 3.3 10*3/uL (ref 1.7–7.7)
Neutrophils Relative %: 66 %
Platelets: 138 10*3/uL — ABNORMAL LOW (ref 150–400)
RBC: 3.42 MIL/uL — ABNORMAL LOW (ref 3.87–5.11)
RDW: 12 % (ref 11.5–15.5)
WBC: 5.1 10*3/uL (ref 4.0–10.5)
nRBC: 0 % (ref 0.0–0.2)

## 2019-07-28 MED ORDER — SODIUM CHLORIDE 0.9 % IV SOLN
INTRAVENOUS | Status: DC
  Filled 2019-07-28 (×3): qty 250
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
## 2019-07-28 NOTE — Assessment & Plan Note (Addendum)
#  Stage IV recurrent adenocarcinoma the lung; EGFR mutated. On  osimertinib 80 mg [Feb 6th 2020]. MARCH 2021- PET scan shows stable/small right-sided pleural effusion; no hypermetabolic noted; mild hypermetabolic noted in the stomach [see below] clinically STABLE  #Hold osimertinib 80 mg/day-given the tenuous renal function.  We will plan to start osimertinib in 2 weeks/once the renal function stabilizes  # ITP- chronic- platelets90 on promacta 25 mg/day.  Stable  # right sided stroke/ seizures-stable on asprin/Plavix; f/u neurology; stable  # left neck mass- ~1-2cm; stable; monitor for now- PET no uptake.   # AKI on CKD- stage IV; GFR from 30 to 14 today; ? Etiology; Left Hydronephrosis; awaiting stent placement.  Recommend fluids today.  Discussed with urology.  # DISPOSITION: # follow up in [facility] 4/22 or 4/23; labs- bmp; possible IVFs  Over 1 hour.  # follow up in 2 weeks;  MD;labs- cbc/cmp;Dr.B

## 2019-07-28 NOTE — Progress Notes (Signed)
Pt in for follow up, reports having kidney stone removed on Monday.  Plavix is on hold.

## 2019-07-29 ENCOUNTER — Encounter
Admission: RE | Admit: 2019-07-29 | Discharge: 2019-07-29 | Disposition: A | Discharge status: Home / Self Care | Payer: Medicare PPO | Source: Ambulatory Visit | Attending: Urology | Admitting: Urology

## 2019-07-29 NOTE — Patient Instructions (Signed)
Your procedure is scheduled on: Monday August 03, 2019 Report to Day Surgery. To find out your arrival time please call [phone] between 1PM - 3PM on Friday July 31, 2019.  Remember: Instructions that are not followed completely may result in serious medical risk,  up to and including death, or upon the discretion of your surgeon and anesthesiologist your  surgery may need to be rescheduled.     _X__ 1. Do not eat food after midnight the night before your procedure.                 No gum chewing or hard candies. You may drink clear liquids up to 2 hours                 before you are scheduled to arrive for your surgery- DO not drink clear                 liquids within 2 hours of the start of your surgery.                 Clear Liquids include:  water, apple juice without pulp, clear Gatorade, G2 or                  Gatorade Zero (avoid Red/Purple/Blue), Black Coffee or Tea (Do not add                 anything to coffee or tea).  __X__2.  On the morning of surgery brush your teeth with toothpaste and water, you                may rinse your mouth with mouthwash if you wish.  Do not swallow any toothpaste of mouthwash.     _X__ 3.  No Alcohol for 24 hours before or after surgery.   _X__ 4.  Do Not Smoke or use e-cigarettes For 24 Hours Prior to Your Surgery.                 Do not use any chewable tobacco products for at least 6 hours prior to                 Surgery.  _X__  5.  Do not use any recreational drugs (marijuana, cocaine, heroin, ecstacy, MDMA or other)                For at least one week prior to your surgery.  Combination of these drugs with anesthesia                May have life threatening results.  __X__ 6.  Notify your doctor if there is any change in your medical condition      (cold, fever, infections).     Do not wear jewelry, make-up, hairpins, clips or nail polish. Do not wear lotions, powders, or perfumes. You may wear  deodorant. Do not shave 48 hours prior to surgery. Men may shave face and neck. Do not bring valuables to the hospital.    [facility] is not responsible for any belongings or valuables.  Contacts, dentures or bridgework may not be worn into surgery. Leave your suitcase in the car. After surgery it may be brought to your room. For patients admitted to the hospital, discharge time is determined by your treatment team.   Patients discharged the day of surgery will not be allowed to drive home.   Make arrangements for someone to be with you for the first  24 hours of your Same Day Discharge.    __X__ Take these medicines the morning of surgery with A SIP OF WATER:    1. pantoprazole (PROTONIX) 40 MG  2. dicyclomine (BENTYL) 10 MG  3. isosorbide mononitrate (IMDUR) 30 MG   __X__ Stop taking clopidogrel (PLAVIX) 75 MG as instructed by provider.  __X__ Stop Anti-inflammatories such as ibuprofen, Aleve, naproxen, and or BC powders.    __X__ Stop supplements until after surgery.    __X__ Do not start any herbal supplements before your surgery.

## 2019-07-30 ENCOUNTER — Other Ambulatory Visit
Admission: RE | Admit: 2019-07-30 | Discharge: 2019-07-30 | Disposition: A | Discharge status: Home / Self Care | Payer: Medicare PPO | Source: Ambulatory Visit | Attending: Urology | Admitting: Urology

## 2019-07-30 ENCOUNTER — Other Ambulatory Visit: Payer: Self-pay

## 2019-07-30 DIAGNOSIS — Z01812 Encounter for preprocedural laboratory examination: Secondary | ICD-10-CM | POA: Diagnosis present

## 2019-07-30 DIAGNOSIS — Z20822 Contact with and (suspected) exposure to covid-19: Secondary | ICD-10-CM | POA: Insufficient documentation

## 2019-07-30 LAB — SARS CORONAVIRUS 2 (TAT 6-24 HRS): SARS Coronavirus 2: NEGATIVE

## 2019-07-30 NOTE — Telephone Encounter (Signed)
Amy with [facility] is calling to follow up in regards to a surgical clearance form that was faxed to the office. She states she a requesting to have the form completed and returned to their office via fax. Patient's surgery is scheduled for Monday, 08/03/19. A call may be returned to Amy at [phone] if we have any questions.  Fax #: [phone]

## 2019-07-31 ENCOUNTER — Inpatient Hospital Stay: Payer: Medicare PPO

## 2019-07-31 VITALS — BP 132/49 | HR 70 | Temp 97.3°F | Resp 16

## 2019-07-31 DIAGNOSIS — E860 Dehydration: Secondary | ICD-10-CM

## 2019-07-31 DIAGNOSIS — C3431 Malignant neoplasm of lower lobe, right bronchus or lung: Secondary | ICD-10-CM

## 2019-07-31 LAB — BASIC METABOLIC PANEL
Anion gap: 11 (ref 5–15)
BUN: 64 mg/dL — ABNORMAL HIGH (ref 8–23)
CO2: 20 mmol/L — ABNORMAL LOW (ref 22–32)
Calcium: 8.8 mg/dL — ABNORMAL LOW (ref 8.9–10.3)
Chloride: 108 mmol/L (ref 98–111)
Creatinine, Ser: 2.63 mg/dL — ABNORMAL HIGH (ref 0.44–1.00)
GFR calc Af Amer: 19 mL/min — ABNORMAL LOW (ref >60)
GFR calc non Af Amer: 16 mL/min — ABNORMAL LOW (ref >60)
Glucose, Bld: 99 mg/dL (ref 70–99)
Potassium: 4.1 mmol/L (ref 3.5–5.1)
Sodium: 139 mmol/L (ref 135–145)

## 2019-07-31 MED ORDER — SODIUM CHLORIDE 0.9 % IV SOLN
Freq: Once | INTRAVENOUS | Status: AC
  Filled 2019-07-31: qty 250

## 2019-07-31 NOTE — Telephone Encounter (Signed)
Contacted Dr. Rayann Heman.  No further recommendations from electrophysiology at this time.

## 2019-07-31 NOTE — Telephone Encounter (Signed)
Correction - I can be reached at [phone].

## 2019-07-31 NOTE — Telephone Encounter (Addendum)
   Primary Cardiologist: Lauree Chandler, MD  Chart reviewed as part of pre-operative protocol coverage. Given past medical history and time since last visit, based on ACC/AHA guidelines, Stacy Cook would be at acceptable risk for the planned procedure without further cardiovascular testing.   She may hold her Plavix for 5 days prior to the procedure and restart as soon as hemostasis is achieved.  No pacemaker changes need to be made prior to procedure.  I will route this recommendation to the requesting party via Epic fax function and remove from pre-op pool.  Please call with questions.  Stacy Ng. Kipling Graser NP-C    07/31/2019, [facility] [address] Office [phone] Fax [phone]

## 2019-08-03 ENCOUNTER — Encounter: Payer: Self-pay | Admitting: Urology

## 2019-08-03 ENCOUNTER — Encounter
Admission: RE | Disposition: A | Discharge status: Home / Self Care | Payer: Self-pay | Source: Home / Self Care | Attending: Urology

## 2019-08-03 ENCOUNTER — Ambulatory Visit
Admission: RE | Admit: 2019-08-03 | Discharge: 2019-08-03 | Disposition: A | Discharge status: Home / Self Care | Payer: Medicare PPO | Attending: Urology | Admitting: Urology

## 2019-08-03 ENCOUNTER — Other Ambulatory Visit: Payer: Self-pay

## 2019-08-03 ENCOUNTER — Telehealth: Payer: Self-pay | Admitting: Urology

## 2019-08-03 ENCOUNTER — Ambulatory Visit: Payer: Medicare PPO | Admitting: Anesthesiology

## 2019-08-03 ENCOUNTER — Ambulatory Visit: Payer: Medicare PPO

## 2019-08-03 DIAGNOSIS — K219 Gastro-esophageal reflux disease without esophagitis: Secondary | ICD-10-CM | POA: Diagnosis not present

## 2019-08-03 DIAGNOSIS — I509 Heart failure, unspecified: Secondary | ICD-10-CM | POA: Diagnosis not present

## 2019-08-03 DIAGNOSIS — I350 Nonrheumatic aortic (valve) stenosis: Secondary | ICD-10-CM | POA: Insufficient documentation

## 2019-08-03 DIAGNOSIS — Z955 Presence of coronary angioplasty implant and graft: Secondary | ICD-10-CM | POA: Diagnosis not present

## 2019-08-03 DIAGNOSIS — J45909 Unspecified asthma, uncomplicated: Secondary | ICD-10-CM | POA: Insufficient documentation

## 2019-08-03 DIAGNOSIS — Z7982 Long term (current) use of aspirin: Secondary | ICD-10-CM | POA: Diagnosis not present

## 2019-08-03 DIAGNOSIS — N200 Calculus of kidney: Secondary | ICD-10-CM

## 2019-08-03 DIAGNOSIS — D696 Thrombocytopenia, unspecified: Secondary | ICD-10-CM | POA: Diagnosis not present

## 2019-08-03 DIAGNOSIS — Z882 Allergy status to sulfonamides status: Secondary | ICD-10-CM | POA: Insufficient documentation

## 2019-08-03 DIAGNOSIS — N132 Hydronephrosis with renal and ureteral calculous obstruction: Secondary | ICD-10-CM

## 2019-08-03 DIAGNOSIS — I110 Hypertensive heart disease with heart failure: Secondary | ICD-10-CM | POA: Diagnosis not present

## 2019-08-03 DIAGNOSIS — Z85828 Personal history of other malignant neoplasm of skin: Secondary | ICD-10-CM | POA: Diagnosis not present

## 2019-08-03 DIAGNOSIS — Z950 Presence of cardiac pacemaker: Secondary | ICD-10-CM | POA: Diagnosis not present

## 2019-08-03 DIAGNOSIS — Z79899 Other long term (current) drug therapy: Secondary | ICD-10-CM | POA: Diagnosis not present

## 2019-08-03 DIAGNOSIS — N179 Acute kidney failure, unspecified: Secondary | ICD-10-CM | POA: Diagnosis not present

## 2019-08-03 DIAGNOSIS — E119 Type 2 diabetes mellitus without complications: Secondary | ICD-10-CM | POA: Diagnosis not present

## 2019-08-03 DIAGNOSIS — Z7902 Long term (current) use of antithrombotics/antiplatelets: Secondary | ICD-10-CM | POA: Diagnosis not present

## 2019-08-03 DIAGNOSIS — Z87442 Personal history of urinary calculi: Secondary | ICD-10-CM | POA: Diagnosis not present

## 2019-08-03 DIAGNOSIS — I2510 Atherosclerotic heart disease of native coronary artery without angina pectoris: Secondary | ICD-10-CM | POA: Diagnosis not present

## 2019-08-03 DIAGNOSIS — Z887 Allergy status to serum and vaccine status: Secondary | ICD-10-CM | POA: Diagnosis not present

## 2019-08-03 DIAGNOSIS — Z888 Allergy status to other drugs, medicaments and biological substances status: Secondary | ICD-10-CM | POA: Diagnosis not present

## 2019-08-03 DIAGNOSIS — Z8673 Personal history of transient ischemic attack (TIA), and cerebral infarction without residual deficits: Secondary | ICD-10-CM | POA: Diagnosis not present

## 2019-08-03 DIAGNOSIS — M479 Spondylosis, unspecified: Secondary | ICD-10-CM | POA: Insufficient documentation

## 2019-08-03 HISTORY — PX: CYSTOSCOPY/URETEROSCOPY/HOLMIUM LASER/STENT PLACEMENT: SHX6546

## 2019-08-03 LAB — GLUCOSE, CAPILLARY
Glucose-Capillary: 93 mg/dL (ref 70–99)
Glucose-Capillary: 122 mg/dL — ABNORMAL HIGH (ref 70–99)

## 2019-08-03 SURGERY — CYSTOSCOPY/URETEROSCOPY/HOLMIUM LASER/STENT PLACEMENT
Anesthesia: General | Laterality: Left

## 2019-08-03 MED ORDER — EPHEDRINE 5 MG/ML INJ
INTRAVENOUS | Status: AC
  Filled 2019-08-03: qty 10

## 2019-08-03 MED ORDER — ACETAMINOPHEN 10 MG/ML IV SOLN
INTRAVENOUS | Status: DC | PRN
  Administered 2019-08-03: 1000 mg via INTRAVENOUS

## 2019-08-03 MED ORDER — ONDANSETRON HCL 4 MG/2ML IJ SOLN: 4.0000 mg | Freq: Once | INTRAMUSCULAR | Status: DC | PRN

## 2019-08-03 MED ORDER — IOPAMIDOL (ISOVUE-200) INJECTION 41%
INTRAVENOUS | Status: DC | PRN
  Administered 2019-08-03: 15 mL

## 2019-08-03 MED ORDER — LIDOCAINE HCL (PF) 2 % IJ SOLN
INTRAMUSCULAR | Status: AC
  Filled 2019-08-03: qty 20

## 2019-08-03 MED ORDER — FENTANYL CITRATE (PF) 100 MCG/2ML IJ SOLN: 25.0000 ug | INTRAMUSCULAR | Status: DC | PRN

## 2019-08-03 MED ORDER — FENTANYL CITRATE (PF) 100 MCG/2ML IJ SOLN
INTRAMUSCULAR | Status: DC | PRN
  Administered 2019-08-03: 50 ug via INTRAVENOUS

## 2019-08-03 MED ORDER — SUGAMMADEX SODIUM 200 MG/2ML IV SOLN
INTRAVENOUS | Status: DC | PRN
  Administered 2019-08-03: 200 mg via INTRAVENOUS

## 2019-08-03 MED ORDER — LIDOCAINE HCL (CARDIAC) PF 100 MG/5ML IV SOSY
PREFILLED_SYRINGE | INTRAVENOUS | Status: DC | PRN
  Administered 2019-08-03: 80 mg via INTRAVENOUS

## 2019-08-03 MED ORDER — ROCURONIUM BROMIDE 100 MG/10ML IV SOLN
INTRAVENOUS | Status: DC | PRN
  Administered 2019-08-03: 10 mg via INTRAVENOUS
  Administered 2019-08-03: 30 mg via INTRAVENOUS

## 2019-08-03 MED ORDER — SUCCINYLCHOLINE CHLORIDE 200 MG/10ML IV SOSY
PREFILLED_SYRINGE | INTRAVENOUS | Status: AC
  Filled 2019-08-03: qty 10

## 2019-08-03 MED ORDER — FENTANYL CITRATE (PF) 100 MCG/2ML IJ SOLN
INTRAMUSCULAR | Status: AC
  Filled 2019-08-03: qty 2

## 2019-08-03 MED ORDER — DEXAMETHASONE SODIUM PHOSPHATE 10 MG/ML IJ SOLN
INTRAMUSCULAR | Status: DC | PRN
  Administered 2019-08-03: 10 mg via INTRAVENOUS

## 2019-08-03 MED ORDER — FAMOTIDINE 20 MG PO TABS
20.0000 mg | ORAL_TABLET | Freq: Once | ORAL | Status: AC
  Administered 2019-08-03: 07:00:00 20 mg via ORAL

## 2019-08-03 MED ORDER — GLYCOPYRROLATE 0.2 MG/ML IJ SOLN
INTRAMUSCULAR | Status: AC
  Filled 2019-08-03: qty 2

## 2019-08-03 MED ORDER — SODIUM CHLORIDE 0.9 % IV SOLN
INTRAVENOUS | Status: DC | PRN
  Administered 2019-08-03: 10 ug/min via INTRAVENOUS

## 2019-08-03 MED ORDER — ONDANSETRON HCL 4 MG/2ML IJ SOLN
INTRAMUSCULAR | Status: DC | PRN
  Administered 2019-08-03: 4 mg via INTRAVENOUS

## 2019-08-03 MED ORDER — LACTATED RINGERS IV BOLUS
200.0000 mL | Freq: Once | INTRAVENOUS | Status: AC
  Administered 2019-08-03: 11:00:00 200 mL via INTRAVENOUS

## 2019-08-03 MED ORDER — ONDANSETRON HCL 4 MG/2ML IJ SOLN
INTRAMUSCULAR | Status: AC
  Filled 2019-08-03: qty 4

## 2019-08-03 MED ORDER — LACTATED RINGERS IV SOLN: INTRAVENOUS | Status: DC

## 2019-08-03 MED ORDER — EPHEDRINE SULFATE 50 MG/ML IJ SOLN
INTRAMUSCULAR | Status: DC | PRN
  Administered 2019-08-03: 5 mg via INTRAVENOUS

## 2019-08-03 MED ORDER — CEFAZOLIN SODIUM-DEXTROSE 2-4 GM/100ML-% IV SOLN
INTRAVENOUS | Status: AC
  Filled 2019-08-03: qty 100

## 2019-08-03 MED ORDER — ACETAMINOPHEN 10 MG/ML IV SOLN
INTRAVENOUS | Status: AC
  Filled 2019-08-03: qty 100

## 2019-08-03 MED ORDER — ETOMIDATE 2 MG/ML IV SOLN
INTRAVENOUS | Status: DC | PRN
  Administered 2019-08-03: 12 mg via INTRAVENOUS

## 2019-08-03 MED ORDER — ROCURONIUM BROMIDE 10 MG/ML (PF) SYRINGE
PREFILLED_SYRINGE | INTRAVENOUS | Status: AC
  Filled 2019-08-03: qty 10

## 2019-08-03 MED ORDER — CEFAZOLIN SODIUM-DEXTROSE 2-4 GM/100ML-% IV SOLN
2.0000 g | INTRAVENOUS | Status: AC
  Administered 2019-08-03: 2 g via INTRAVENOUS

## 2019-08-03 MED ORDER — PROPOFOL 10 MG/ML IV BOLUS
INTRAVENOUS | Status: AC
  Filled 2019-08-03: qty 80

## 2019-08-03 MED ORDER — DEXAMETHASONE SODIUM PHOSPHATE 10 MG/ML IJ SOLN
INTRAMUSCULAR | Status: AC
  Filled 2019-08-03: qty 2

## 2019-08-03 MED ORDER — FAMOTIDINE 20 MG PO TABS
ORAL_TABLET | ORAL | Status: AC
  Filled 2019-08-03: qty 1

## 2019-08-03 MED ORDER — SUCCINYLCHOLINE CHLORIDE 20 MG/ML IJ SOLN
INTRAMUSCULAR | Status: DC | PRN
  Administered 2019-08-03: 80 mg via INTRAVENOUS

## 2019-08-03 MED ORDER — LACTATED RINGERS IV SOLN: INTRAVENOUS | Status: DC | PRN

## 2019-08-03 MED ORDER — VASOPRESSIN 20 UNIT/ML IV SOLN
INTRAVENOUS | Status: AC
  Filled 2019-08-03: qty 1

## 2019-08-03 MED ORDER — GLYCOPYRROLATE 0.2 MG/ML IJ SOLN
INTRAMUSCULAR | Status: DC | PRN
  Administered 2019-08-03: .1 mg via INTRAVENOUS

## 2019-08-03 SURGICAL SUPPLY — 30 items
ADHESIVE MASTISOL STRL (MISCELLANEOUS) ×3 IMPLANT
BAG DRAIN CYSTO-URO LG1000N (MISCELLANEOUS) ×3 IMPLANT
BASKET ZERO TIP 1.9FR (BASKET) ×3 IMPLANT
BRUSH SCRUB EZ 1% IODOPHOR (MISCELLANEOUS) ×3 IMPLANT
CATH URETL 5X70 OPEN END (CATHETERS) ×3 IMPLANT
CNTNR SPEC 2.5X3XGRAD LEK (MISCELLANEOUS) ×1
CONT SPEC 4OZ STER OR WHT (MISCELLANEOUS) ×2
CONTAINER SPEC 2.5X3XGRAD LEK (MISCELLANEOUS) ×1 IMPLANT
DRAPE UTILITY 15X26 TOWEL STRL (DRAPES) ×3 IMPLANT
DRSG TEGADERM 2-3/8X2-3/4 SM (GAUZE/BANDAGES/DRESSINGS) ×3 IMPLANT
FIBER LASER TRACTIP 200 (UROLOGICAL SUPPLIES) ×3 IMPLANT
GLOVE BIO SURGEON STRL SZ 6.5 (GLOVE) ×2 IMPLANT
GLOVE BIO SURGEONS STRL SZ 6.5 (GLOVE) ×1
GOWN STRL REUS W/ TWL LRG LVL3 (GOWN DISPOSABLE) ×2 IMPLANT
GOWN STRL REUS W/TWL LRG LVL3 (GOWN DISPOSABLE) ×4
GUIDEWIRE GREEN .038 145CM (MISCELLANEOUS) ×3 IMPLANT
GUIDEWIRE STR DUAL SENSOR (WIRE) ×3 IMPLANT
INFUSOR MANOMETER BAG 3000ML (MISCELLANEOUS) ×6 IMPLANT
INTRODUCER DILATOR DOUBLE (INTRODUCER) ×3 IMPLANT
KIT TURNOVER CYSTO (KITS) ×3 IMPLANT
PACK CYSTO AR (MISCELLANEOUS) ×3 IMPLANT
SET CYSTO W/LG BORE CLAMP LF (SET/KITS/TRAYS/PACK) ×3 IMPLANT
SHEATH URETERAL 12FRX35CM (MISCELLANEOUS) ×3 IMPLANT
SOL .9 NS 3000ML IRR  AL (IV SOLUTION) ×4
SOL .9 NS 3000ML IRR UROMATIC (IV SOLUTION) ×2 IMPLANT
STENT URET 6FRX22 CONTOUR (STENTS) ×3 IMPLANT
STENT URET 6FRX24 CONTOUR (STENTS) IMPLANT
STENT URET 6FRX26 CONTOUR (STENTS) IMPLANT
SURGILUBE 2OZ TUBE FLIPTOP (MISCELLANEOUS) IMPLANT
WATER STERILE IRR 1000ML POUR (IV SOLUTION) ×3 IMPLANT

## 2019-08-03 NOTE — Telephone Encounter (Signed)
[employer] representative will need to be present at procedure due to presence of Micra AV pacemaker. Peri-op device management form faxed back to [facility] and [facility] on 07/27/19 and 07/31/19 with this recommendation. Fax confirmation was received 07/31/19.

## 2019-08-03 NOTE — Transfer of Care (Signed)
Immediate Anesthesia Transfer of Care Note  Patient: Stacy Cook  Procedure(s) Performed: CYSTOSCOPY/URETEROSCOPY/HOLMIUM LASER/STENT PLACEMENT (Left )  Patient Location: PACU  Anesthesia Type:General  Level of Consciousness: awake, oriented and patient cooperative  Airway & Oxygen Therapy: Patient Spontanous Breathing and Patient connected to face mask oxygen  Post-op Assessment: Report given to RN and Post -op Vital signs reviewed and stable  Post vital signs: Reviewed and stable  Last Vitals:  Vitals Value Taken Time  BP 141/48 08/03/19 1002  Temp    Pulse 65 08/03/19 1005  Resp 14 08/03/19 1005  SpO2 100 % 08/03/19 1005  Vitals shown include unvalidated device data.  Last Pain:  Vitals:   08/03/19 0558  TempSrc: Tympanic  PainSc: 0-No pain         Complications: No apparent anesthesia complications

## 2019-08-03 NOTE — Anesthesia Preprocedure Evaluation (Signed)
Anesthesia Evaluation  Patient identified by MRN, date of birth, ID band Patient awake    Reviewed: Allergy & Precautions, NPO status , Patient's Chart, lab work & pertinent test results, reviewed documented beta blocker date and time   Airway Mallampati: II  TM Distance: >3 FB Neck ROM: Full    Dental  (+) Chipped,    Pulmonary asthma ,    Pulmonary exam normal breath sounds clear to auscultation       Cardiovascular hypertension, Pt. on medications and Pt. on home beta blockers + angina with exertion + CAD and +CHF  + dysrhythmias + pacemaker + Valvular Problems/Murmurs AS  Rhythm:Regular Rate:Normal + Systolic murmurs ECG: SB, rate 59. Sinus bradycardia  ECHO:  1. The left ventricle has normal systolic function with an ejection fraction of 60-65%. The cavity size was normal. There is moderately increased left ventricular wall thickness. Left ventricular diastolic Doppler parameters are consistent with  pseudonormalization. Elevated mean left atrial pressure. 2. The right ventricle has normal systolic function. The cavity was normal. 3. Left atrial size was moderately dilated. 4. The mitral valve is abnormal. Mild thickening of the mitral valve leaflet. There is moderate mitral annular calcification present. 5. The tricuspid valve is grossly normal. 6. The aortic valve is tricuspid. Severe calcifcation of the aortic valve. Aortic valve regurgitation is mild by color flow Doppler. Severe stenosis of the aortic valve. 7. The aorta is normal in size and structure. 8. Normal LV systolic function; moderate diastolic dysfunction; moderate LVH; calcified aortic valve with severe AS (mean gradient 45 mmHg) and mild AI; mild MR; moderate LAE.   Neuro/Psych Seizures -,   Neuromuscular disease CVA negative psych ROS   GI/Hepatic Neg liver ROS, GERD  Medicated and Controlled,  Endo/Other  diabetes  Renal/GU CRFRenal disease       Musculoskeletal negative musculoskeletal ROS (+)   Abdominal   Peds  Hematology  (+) anemia , Thrombocytopenia   Anesthesia Other Findings Severe Aortic Stenosis  Reproductive/Obstetrics                             Anesthesia Physical  Anesthesia Plan  ASA: IV  Anesthesia Plan: General   Post-op Pain Management:    Induction: Intravenous  PONV Risk Score and Plan: 2 and Ondansetron, Dexamethasone and Treatment may vary due to age or medical condition  Airway Management Planned: Oral ETT  Additional Equipment:   Intra-op Plan:   Post-operative Plan: Extubation in OR  Informed Consent: I have reviewed the patients History and Physical, chart, labs and discussed the procedure including the risks, benefits and alternatives for the proposed anesthesia with the patient or authorized representative who has indicated his/her understanding and acceptance.     Dental advisory given  Plan Discussed with: CRNA  Anesthesia Plan Comments: ( )        Anesthesia Quick Evaluation

## 2019-08-03 NOTE — Interval H&P Note (Signed)
History and Physical Interval Note:  08/03/2019 7:25 AM  Stacy Cook  has presented today for surgery, with the diagnosis of left nephrolithiasis.  The various methods of treatment have been discussed with the patient and family. After consideration of risks, benefits and other options for treatment, the patient has consented to  Procedure(s): CYSTOSCOPY/URETEROSCOPY/HOLMIUM LASER/STENT PLACEMENT (Left) as a surgical intervention.  The patient's history has been reviewed, patient examined, no change in status, stable for surgery.  I have reviewed the patient's chart and labs.  Questions were answered to the patient's satisfaction.    RRR CTAB  PReop UCx neg  Hollice Espy

## 2019-08-03 NOTE — OR Nursing (Signed)
Fluid bolus given as ordered postop.   Discussed BP with Dr. Kayleen Memos after given; states ok to discharge patient to home.

## 2019-08-03 NOTE — Discharge Instructions (Addendum)
You have a ureteral stent in place.  This is a tube that extends from your kidney to your bladder.  This may cause urinary bleeding, burning with urination, and urinary frequency.  Please call our office or present to the ED if you develop fevers >101 or pain which is not able to be controlled with oral pain medications.  You may be given either Flomax and/ or ditropan to help with bladder spasms and stent pain in addition to pain medications.    Your stent is taped to your left inner thigh.  On Thursday morning, you may untaped this and gently pull until the entire stent is removed.  You will follow up with [place] in 4 weeks with a renal ultrasound just prior to the visit.\  You may start taking aspirin again today and tomorrow if your urine is clear, you may take aspirin and Plavix.   [facility] [address] [place], [place] [address] [phone]   AMBULATORY SURGERY  DISCHARGE INSTRUCTIONS   1) The drugs that you were given will stay in your system until tomorrow so for the next 24 hours you should not:  A) Drive an automobile B) Make any legal decisions C) Drink any alcoholic beverage   2) You may resume regular meals tomorrow.  Today it is better to start with liquids and gradually work up to solid foods.  You may eat anything you prefer, but it is better to start with liquids, then soup and crackers, and gradually work up to solid foods.   3) Please notify your doctor immediately if you have any unusual bleeding, trouble breathing, redness and pain at the surgery site, drainage, fever, or pain not relieved by medication.    4) Additional Instructions:        Please contact your physician with any problems or Same Day Surgery at [phone], Monday through Friday 6 am to 4 pm, or [place] at [facility] number at [phone].

## 2019-08-03 NOTE — Telephone Encounter (Signed)
This patient is being discharged from PACU and will need a 4-week follow-up with renal ultrasound prior.  The order was placed.  Please make sure that it gets scheduled.  Hollice Espy, MD

## 2019-08-03 NOTE — Op Note (Signed)
Date of procedure: 08/03/19  Preoperative diagnosis:  1. Left nephrolithiasis 2. Left hydronephrosis 3. Acute kidney injury  Postoperative diagnosis:  1. Same as above  Procedure: 1. Left ureteroscopy 2. Laser lithotripsy 3. Left retrograde pyelogram 4. Left ureteral stent placement 5. Basket extraction of stone fragment  Surgeon: Hollice Espy, MD  Anesthesia: General  Complications: None  Intraoperative findings: Mild edema the UPJ with large renal pelvic stone as well as to lower pole nonobstructing stones, all fragmented.  Uncomplicated.  EBL: Minimal  Specimens: Stones for stone analysis  Drains: 6 x 22 French double-J ureteral stent placement on the left  Indication: Stacy Cook is a 84 y.o. patient with likely intermittently obstructing left nephrolithiasis who presents today for ureteroscopic management.  After reviewing the management options for treatment, she elected to proceed with the above surgical procedure(s). We have discussed the potential benefits and risks of the procedure, side effects of the proposed treatment, the likelihood of the patient achieving the goals of the procedure, and any potential problems that might occur during the procedure or recuperation. Informed consent has been obtained.  Description of procedure:  The patient was taken to the operating room and general anesthesia was induced.  The patient was placed in the dorsal lithotomy position, prepped and draped in the usual sterile fashion, and preoperative antibiotics were administered. A preoperative time-out was performed.   A 21 French scope was advanced per urethra into the bladder.  Attention was turned to the left ureter orifice which was cannulated using a 5 [place] open-ended ureteral catheter.  Gentle retrograde pyelogram was performed which showed a filling defect at the known stone in the renal pelvis which could also be seen on scout imaging as well as lower pole  nonobstructing calculi.  A wire was then placed up to the level of the kidney.  A dual-lumen sheath was then used to advance a Super Stiff wire as a working wire.  The safety wire was snapped in place.  A Cook 12/14 French ureteral access sheath was then advanced to level the proximal ureter without difficulty under fluoroscopic guidance.  The working wire and inner cannula were removed and a dual-lumen Wolf flexible digital ureteroscope was brought in and advanced to the UPJ which is found to have some slight edema at which time the large UPJ stone was encountered.  A 200 m laser fiber was then brought in and using settings of 0.2 J and 40 Hz, the stone was dusted.  Notably, the core of the stone was quite hard compared to the outer crest.  Ultimately, is able to fragment the stone into innumerable tiny dust like particles.  I was able to access the lower pole with some difficulty but ultimately successfully.  The stone was also fragmented and one of the stones was basketed and repositioned in a midpole calyx for further fragmentation.  I used a 1.9 [place] tipless nitinol basket to extract the larger pieces.  Once the stone was adequately dusted into particles no greater than the tip of the laser fiber, final retrograde pyelogram was performed.  This created a roadmap to ensure that each and every calyx was directly visualized and satisfactorily cleared.  No extravasation was noted.  The scope was then backed down the length the ureter inspecting along the way removing the access sheath at the same time.  There are no ureteral injuries or fragments appreciated.  Finally, 6 x 22 French double-J ureteral stent was advanced over the wire up to  level the kidney.  The wires partially drawn till full coils noted both within the renal pelvis as well as within the bladder.  The bladder was drained using the sheath.  The patient was dried.  The stent string was affixed to the patient's left inner thigh using muscle and  Tegaderm.  She was then cleaned and dried, repositioned in supine position, wrist myesthesia taken the PACU in stable condition.  Plan: She will remove her own stent on Thursday morning.  She may resume aspirin and Plavix tomorrow if her urine is clear and may have aspirin today.  She will follow-up in 4 weeks with renal ultrasound prior.  Hollice Espy, M.D.

## 2019-08-03 NOTE — Anesthesia Postprocedure Evaluation (Signed)
Anesthesia Post Note  Patient: Stacy Cook  Procedure(s) Performed: CYSTOSCOPY/URETEROSCOPY/HOLMIUM LASER/STENT PLACEMENT (Left )  Patient location during evaluation: PACU Anesthesia Type: General Level of consciousness: awake and alert and oriented Pain management: pain level controlled Vital Signs Assessment: post-procedure vital signs reviewed and stable Respiratory status: spontaneous breathing Cardiovascular status: blood pressure returned to baseline Anesthetic complications: no     Last Vitals:  Vitals:   08/03/19 1041 08/03/19 1106  BP: (!) 130/37 (!) 135/44  Pulse: 77 72  Resp: 16 16  Temp: 36.8 C   SpO2: 100% 100%    Last Pain:  Vitals:   08/03/19 1106  TempSrc:   PainSc: 0-No pain                 Claudina Oliphant

## 2019-08-03 NOTE — Anesthesia Procedure Notes (Signed)
Procedure Name: Intubation Performed by: Kelton Pillar, CRNA Pre-anesthesia Checklist: Patient identified, Emergency Drugs available, Suction available and Patient being monitored Patient Re-evaluated:Patient Re-evaluated prior to induction Oxygen Delivery Method: Circle system utilized Preoxygenation: Pre-oxygenation with 100% oxygen Induction Type: IV induction Ventilation: Mask ventilation without difficulty Laryngoscope Size: McGraph and 3 Grade View: Grade I Tube type: Oral Tube size: 6.5 mm Number of attempts: 1 Airway Equipment and Method: Stylet Placement Confirmation: ETT inserted through vocal cords under direct vision,  positive ETCO2,  CO2 detector and breath sounds checked- equal and bilateral Tube secured with: Tape Dental Injury: Teeth and Oropharynx as per pre-operative assessment

## 2019-08-04 ENCOUNTER — Other Ambulatory Visit: Payer: Self-pay | Admitting: Cardiovascular Disease

## 2019-08-10 LAB — CALCULI, WITH PHOTOGRAPH (CLINICAL LAB)
Calcium Oxalate Monohydrate: 20 %
Uric Acid Calculi: 80 %
Weight Calculi: 55 mg

## 2019-08-11 ENCOUNTER — Inpatient Hospital Stay (HOSPITAL_BASED_OUTPATIENT_CLINIC_OR_DEPARTMENT_OTHER): Payer: Medicare PPO | Admitting: Internal Medicine

## 2019-08-11 ENCOUNTER — Encounter: Payer: Self-pay | Admitting: Internal Medicine

## 2019-08-11 ENCOUNTER — Other Ambulatory Visit: Payer: Self-pay

## 2019-08-11 ENCOUNTER — Inpatient Hospital Stay: Payer: Medicare PPO | Attending: Internal Medicine

## 2019-08-11 DIAGNOSIS — I2510 Atherosclerotic heart disease of native coronary artery without angina pectoris: Secondary | ICD-10-CM | POA: Diagnosis not present

## 2019-08-11 DIAGNOSIS — Z85828 Personal history of other malignant neoplasm of skin: Secondary | ICD-10-CM | POA: Diagnosis not present

## 2019-08-11 DIAGNOSIS — K219 Gastro-esophageal reflux disease without esophagitis: Secondary | ICD-10-CM | POA: Insufficient documentation

## 2019-08-11 DIAGNOSIS — R5383 Other fatigue: Secondary | ICD-10-CM | POA: Diagnosis not present

## 2019-08-11 DIAGNOSIS — Z950 Presence of cardiac pacemaker: Secondary | ICD-10-CM | POA: Diagnosis not present

## 2019-08-11 DIAGNOSIS — J90 Pleural effusion, not elsewhere classified: Secondary | ICD-10-CM | POA: Diagnosis not present

## 2019-08-11 DIAGNOSIS — E1122 Type 2 diabetes mellitus with diabetic chronic kidney disease: Secondary | ICD-10-CM | POA: Diagnosis not present

## 2019-08-11 DIAGNOSIS — E538 Deficiency of other specified B group vitamins: Secondary | ICD-10-CM | POA: Diagnosis not present

## 2019-08-11 DIAGNOSIS — Z8673 Personal history of transient ischemic attack (TIA), and cerebral infarction without residual deficits: Secondary | ICD-10-CM | POA: Insufficient documentation

## 2019-08-11 DIAGNOSIS — E785 Hyperlipidemia, unspecified: Secondary | ICD-10-CM | POA: Diagnosis not present

## 2019-08-11 DIAGNOSIS — M810 Age-related osteoporosis without current pathological fracture: Secondary | ICD-10-CM | POA: Insufficient documentation

## 2019-08-11 DIAGNOSIS — R531 Weakness: Secondary | ICD-10-CM | POA: Diagnosis not present

## 2019-08-11 DIAGNOSIS — I6529 Occlusion and stenosis of unspecified carotid artery: Secondary | ICD-10-CM | POA: Insufficient documentation

## 2019-08-11 DIAGNOSIS — D693 Immune thrombocytopenic purpura: Secondary | ICD-10-CM | POA: Diagnosis not present

## 2019-08-11 DIAGNOSIS — Z7982 Long term (current) use of aspirin: Secondary | ICD-10-CM | POA: Insufficient documentation

## 2019-08-11 DIAGNOSIS — I129 Hypertensive chronic kidney disease with stage 1 through stage 4 chronic kidney disease, or unspecified chronic kidney disease: Secondary | ICD-10-CM | POA: Diagnosis not present

## 2019-08-11 DIAGNOSIS — C3431 Malignant neoplasm of lower lobe, right bronchus or lung: Secondary | ICD-10-CM

## 2019-08-11 DIAGNOSIS — N184 Chronic kidney disease, stage 4 (severe): Secondary | ICD-10-CM | POA: Insufficient documentation

## 2019-08-11 DIAGNOSIS — Z79899 Other long term (current) drug therapy: Secondary | ICD-10-CM | POA: Diagnosis not present

## 2019-08-11 DIAGNOSIS — M129 Arthropathy, unspecified: Secondary | ICD-10-CM | POA: Diagnosis not present

## 2019-08-11 LAB — CBC WITH DIFFERENTIAL/PLATELET
Abs Immature Granulocytes: 0.01 10*3/uL (ref 0.00–0.07)
Basophils Absolute: 0 10*3/uL (ref 0.0–0.1)
Basophils Relative: 1 %
Eosinophils Absolute: 0.2 10*3/uL (ref 0.0–0.5)
Eosinophils Relative: 4 %
HCT: 27 % — ABNORMAL LOW (ref 36.0–46.0)
Hemoglobin: 9 g/dL — ABNORMAL LOW (ref 12.0–15.0)
Immature Granulocytes: 0 %
Lymphocytes Relative: 30 %
Lymphs Abs: 1.2 10*3/uL (ref 0.7–4.0)
MCH: 29.6 pg (ref 26.0–34.0)
MCHC: 33.3 g/dL (ref 30.0–36.0)
MCV: 88.8 fL (ref 80.0–100.0)
Monocytes Absolute: 0.3 10*3/uL (ref 0.1–1.0)
Monocytes Relative: 8 %
Neutro Abs: 2.3 10*3/uL (ref 1.7–7.7)
Neutrophils Relative %: 57 %
Platelets: 118 10*3/uL — ABNORMAL LOW (ref 150–400)
RBC: 3.04 MIL/uL — ABNORMAL LOW (ref 3.87–5.11)
RDW: 13.1 % (ref 11.5–15.5)
WBC: 4 10*3/uL (ref 4.0–10.5)
nRBC: 0 % (ref 0.0–0.2)

## 2019-08-11 LAB — COMPREHENSIVE METABOLIC PANEL
ALT: 39 U/L (ref 0–44)
AST: 41 U/L (ref 15–41)
Albumin: 3.5 g/dL (ref 3.5–5.0)
Alkaline Phosphatase: 69 U/L (ref 38–126)
Anion gap: 10 (ref 5–15)
BUN: 28 mg/dL — ABNORMAL HIGH (ref 8–23)
CO2: 22 mmol/L (ref 22–32)
Calcium: 8.5 mg/dL — ABNORMAL LOW (ref 8.9–10.3)
Chloride: 111 mmol/L (ref 98–111)
Creatinine, Ser: 1.48 mg/dL — ABNORMAL HIGH (ref 0.44–1.00)
GFR calc Af Amer: 38 mL/min — ABNORMAL LOW (ref >60)
GFR calc non Af Amer: 33 mL/min — ABNORMAL LOW (ref >60)
Glucose, Bld: 93 mg/dL (ref 70–99)
Potassium: 3.7 mmol/L (ref 3.5–5.1)
Sodium: 143 mmol/L (ref 135–145)
Total Bilirubin: 0.5 mg/dL (ref 0.3–1.2)
Total Protein: 6.2 g/dL — ABNORMAL LOW (ref 6.5–8.1)

## 2019-08-11 NOTE — Assessment & Plan Note (Addendum)
#  Stage IV recurrent adenocarcinoma the lung; EGFR mutated. On  osimertinib 80 mg [Feb 6th 2020]. MARCH 2021- PET scan shows stable/small right-sided pleural effusion; no hypermetabolic noted; mild hypermetabolic noted in the stomach [see below] clinically  STABLE.  # re-start Osimertinib 80 mg/day; given improvement of renal function.  # ITP- chronic- platelets ~ 100-90 on promacta 25 mg/day.  Stable  # right sided stroke/ seizures-on asprin/Plavix; f/u neurology; stable  # AKI on CKD- stage IV; GFR 14 ? Etiology; Left Hydronephrosis s/p ureteral stent placement.-Renal function improved to GFR 30 today.  Continue to fluids.  # Elevated BP- 160s; recommend keep a log; and reach to Dr.Singh or cardiology for titration of blood pressure medication.   # DISPOSITION: # cancel appt with Josh [pt pref] # follow up in 4 weeks;  MD;labs- cbc/cmp;Dr.B
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
## 2019-08-25 ENCOUNTER — Encounter: Payer: Medicare PPO | Admitting: Hospice and Palliative Medicine

## 2019-08-28 ENCOUNTER — Other Ambulatory Visit: Payer: Self-pay

## 2019-08-28 ENCOUNTER — Ambulatory Visit
Admission: RE | Admit: 2019-08-28 | Discharge: 2019-08-28 | Disposition: A | Discharge status: Home / Self Care | Payer: Medicare PPO | Source: Ambulatory Visit | Attending: Urology | Admitting: Urology

## 2019-08-28 DIAGNOSIS — N200 Calculus of kidney: Secondary | ICD-10-CM | POA: Insufficient documentation
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
## 2019-09-01 ENCOUNTER — Other Ambulatory Visit: Payer: Self-pay

## 2019-09-01 ENCOUNTER — Ambulatory Visit (INDEPENDENT_AMBULATORY_CARE_PROVIDER_SITE_OTHER): Payer: Medicare PPO | Admitting: Urology

## 2019-09-01 ENCOUNTER — Encounter: Payer: Self-pay | Admitting: Urology

## 2019-09-01 VITALS — BP 164/64 | HR 61

## 2019-09-01 DIAGNOSIS — N200 Calculus of kidney: Secondary | ICD-10-CM

## 2019-09-02 ENCOUNTER — Ambulatory Visit: Payer: Medicare PPO

## 2019-09-08 ENCOUNTER — Inpatient Hospital Stay: Payer: Medicare PPO | Attending: Internal Medicine

## 2019-09-08 ENCOUNTER — Other Ambulatory Visit: Payer: Self-pay

## 2019-09-08 ENCOUNTER — Telehealth: Payer: Self-pay | Admitting: Internal Medicine

## 2019-09-08 ENCOUNTER — Encounter: Payer: Self-pay | Admitting: Internal Medicine

## 2019-09-08 ENCOUNTER — Inpatient Hospital Stay: Payer: Medicare PPO | Admitting: Internal Medicine

## 2019-09-08 DIAGNOSIS — C3431 Malignant neoplasm of lower lobe, right bronchus or lung: Secondary | ICD-10-CM | POA: Insufficient documentation

## 2019-09-08 DIAGNOSIS — M7989 Other specified soft tissue disorders: Secondary | ICD-10-CM | POA: Insufficient documentation

## 2019-09-08 DIAGNOSIS — E119 Type 2 diabetes mellitus without complications: Secondary | ICD-10-CM | POA: Insufficient documentation

## 2019-09-08 DIAGNOSIS — E785 Hyperlipidemia, unspecified: Secondary | ICD-10-CM | POA: Insufficient documentation

## 2019-09-08 DIAGNOSIS — E538 Deficiency of other specified B group vitamins: Secondary | ICD-10-CM | POA: Diagnosis not present

## 2019-09-08 DIAGNOSIS — Z79899 Other long term (current) drug therapy: Secondary | ICD-10-CM | POA: Diagnosis not present

## 2019-09-08 DIAGNOSIS — R531 Weakness: Secondary | ICD-10-CM | POA: Insufficient documentation

## 2019-09-08 DIAGNOSIS — D693 Immune thrombocytopenic purpura: Secondary | ICD-10-CM | POA: Insufficient documentation

## 2019-09-08 DIAGNOSIS — N183 Chronic kidney disease, stage 3 unspecified: Secondary | ICD-10-CM | POA: Insufficient documentation

## 2019-09-08 DIAGNOSIS — I350 Nonrheumatic aortic (valve) stenosis: Secondary | ICD-10-CM | POA: Diagnosis not present

## 2019-09-08 DIAGNOSIS — R5383 Other fatigue: Secondary | ICD-10-CM | POA: Diagnosis not present

## 2019-09-08 DIAGNOSIS — Z8673 Personal history of transient ischemic attack (TIA), and cerebral infarction without residual deficits: Secondary | ICD-10-CM | POA: Insufficient documentation

## 2019-09-08 DIAGNOSIS — R0602 Shortness of breath: Secondary | ICD-10-CM | POA: Diagnosis not present

## 2019-09-08 DIAGNOSIS — D696 Thrombocytopenia, unspecified: Secondary | ICD-10-CM | POA: Insufficient documentation

## 2019-09-08 DIAGNOSIS — I2510 Atherosclerotic heart disease of native coronary artery without angina pectoris: Secondary | ICD-10-CM | POA: Insufficient documentation

## 2019-09-08 DIAGNOSIS — Z7982 Long term (current) use of aspirin: Secondary | ICD-10-CM | POA: Diagnosis not present

## 2019-09-08 DIAGNOSIS — I129 Hypertensive chronic kidney disease with stage 1 through stage 4 chronic kidney disease, or unspecified chronic kidney disease: Secondary | ICD-10-CM | POA: Insufficient documentation

## 2019-09-08 DIAGNOSIS — N132 Hydronephrosis with renal and ureteral calculous obstruction: Secondary | ICD-10-CM | POA: Insufficient documentation

## 2019-09-08 DIAGNOSIS — K219 Gastro-esophageal reflux disease without esophagitis: Secondary | ICD-10-CM | POA: Insufficient documentation

## 2019-09-08 LAB — CBC WITH DIFFERENTIAL/PLATELET
Abs Immature Granulocytes: 0.01 10*3/uL (ref 0.00–0.07)
Basophils Absolute: 0 10*3/uL (ref 0.0–0.1)
Basophils Relative: 0 %
Eosinophils Absolute: 0.1 10*3/uL (ref 0.0–0.5)
Eosinophils Relative: 4 %
HCT: 30.7 % — ABNORMAL LOW (ref 36.0–46.0)
Hemoglobin: 10.3 g/dL — ABNORMAL LOW (ref 12.0–15.0)
Immature Granulocytes: 0 %
Lymphocytes Relative: 26 %
Lymphs Abs: 0.8 10*3/uL (ref 0.7–4.0)
MCH: 30.9 pg (ref 26.0–34.0)
MCHC: 33.6 g/dL (ref 30.0–36.0)
MCV: 92.2 fL (ref 80.0–100.0)
Monocytes Absolute: 0.2 10*3/uL (ref 0.1–1.0)
Monocytes Relative: 7 %
Neutro Abs: 2 10*3/uL (ref 1.7–7.7)
Neutrophils Relative %: 63 %
Platelets: 98 10*3/uL — ABNORMAL LOW (ref 150–400)
RBC: 3.33 MIL/uL — ABNORMAL LOW (ref 3.87–5.11)
RDW: 14.5 % (ref 11.5–15.5)
WBC: 3.2 10*3/uL — ABNORMAL LOW (ref 4.0–10.5)
nRBC: 0 % (ref 0.0–0.2)

## 2019-09-08 LAB — COMPREHENSIVE METABOLIC PANEL
ALT: 15 U/L (ref 0–44)
AST: 16 U/L (ref 15–41)
Albumin: 3.9 g/dL (ref 3.5–5.0)
Alkaline Phosphatase: 62 U/L (ref 38–126)
Anion gap: 8 (ref 5–15)
BUN: 24 mg/dL — ABNORMAL HIGH (ref 8–23)
CO2: 25 mmol/L (ref 22–32)
Calcium: 8.5 mg/dL — ABNORMAL LOW (ref 8.9–10.3)
Chloride: 107 mmol/L (ref 98–111)
Creatinine, Ser: 1.3 mg/dL — ABNORMAL HIGH (ref 0.44–1.00)
GFR calc Af Amer: 45 mL/min — ABNORMAL LOW (ref >60)
GFR calc non Af Amer: 38 mL/min — ABNORMAL LOW (ref >60)
Glucose, Bld: 82 mg/dL (ref 70–99)
Potassium: 3.4 mmol/L — ABNORMAL LOW (ref 3.5–5.1)
Sodium: 140 mmol/L (ref 135–145)
Total Bilirubin: 0.9 mg/dL (ref 0.3–1.2)
Total Protein: 6.3 g/dL — ABNORMAL LOW (ref 6.5–8.1)

## 2019-09-08 MED ORDER — POTASSIUM CITRATE ER 10 MEQ (1080 MG) PO TBCR: 10.0000 meq | EXTENDED_RELEASE_TABLET | Freq: Two times a day (BID) | ORAL | 2 refills | Status: DC
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
## 2019-09-08 NOTE — Telephone Encounter (Signed)
On 6/01-I tried to reach patient/unable to leave voicemail.  T/H-please inform patient that I have discussed with Dr. Candiss Norse nephrology; who agrees with recommendation to start potassium citrate to improve the risk of kidney stones.  Please inform that I have sent a prescription for potassium citrate to her pharmacy.  GB

## 2019-09-08 NOTE — Assessment & Plan Note (Addendum)
#  Stage IV recurrent adenocarcinoma the lung; EGFR mutated. On  osimertinib 80 mg [Feb 6th 2020]. MARCH 2021- PET scan shows stable/small right-sided pleural effusion; no hypermetabolic noted; mild hypermetabolic noted in the stomach [see below] clinically  STABLE.   # currently on Osimertinib 80 mg/day; tolerating well.   # RIght LE swelling-dependent/clinically not suggestive of DVT.  Leg elevation/compression stocking.   # ITP- chronic- platelets ~ 100-90 on promacta 25 mg/day. STABLE.   # right sided stroke/ seizures-on asprin/Plavix; f/u neurology; STABLE.   # CKD- stage III Left Hydronephrosis s/p ureteral stent placement/nephrolithiasis-~ GFR 30 today; discussed with Dr. Tommi Emery 10 meq twice daily to start. Increase in a few months to 20 twice daily;  Stable.   # Elevated BP- 160s; stable.  # DISPOSITION: # follow up in 4 weeks;  MD;labs- cbc/cmp;Dr.B

## 2019-09-09 NOTE — Telephone Encounter (Signed)
Spoke with patient. Patient gave verbal understanding of the plan of care. Pt aware that potassium citrate script was sent to her pharmacy.

## 2019-09-17 ENCOUNTER — Ambulatory Visit (INDEPENDENT_AMBULATORY_CARE_PROVIDER_SITE_OTHER): Payer: Medicare PPO | Admitting: *Deleted

## 2019-09-17 DIAGNOSIS — I459 Conduction disorder, unspecified: Secondary | ICD-10-CM

## 2019-09-17 LAB — CUP PACEART REMOTE DEVICE CHECK
Battery Remaining Longevity: 96 mo
Battery Voltage: 3.03 V
Brady Statistic AS VP Percent: 64.7 %
Brady Statistic AS VS Percent: 0 %
Brady Statistic RV Percent Paced: 99.94 %
Date Time Interrogation Session: 20210610075400
Implantable Pulse Generator Implant Date: 20200901
Lead Channel Impedance Value: 490 Ohm
Lead Channel Pacing Threshold Amplitude: 0.5 V
Lead Channel Pacing Threshold Pulse Width: 0.24 ms
Lead Channel Sensing Intrinsic Amplitude: 4.5 mV
Lead Channel Setting Pacing Amplitude: 1.125
Lead Channel Setting Pacing Pulse Width: 0.24 ms
Lead Channel Setting Sensing Sensitivity: 2.8 mV

## 2019-09-18 NOTE — Progress Notes (Signed)
Remote pacemaker transmission.   
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
## 2019-09-21 ENCOUNTER — Ambulatory Visit: Payer: Medicare PPO | Admitting: Student

## 2019-09-21 ENCOUNTER — Encounter: Payer: Self-pay | Admitting: Student

## 2019-09-21 ENCOUNTER — Other Ambulatory Visit: Payer: Self-pay

## 2019-09-21 VITALS — BP 162/70 | HR 84 | Ht 61.0 in | Wt 104.6 lb

## 2019-09-21 DIAGNOSIS — I459 Conduction disorder, unspecified: Secondary | ICD-10-CM

## 2019-09-21 DIAGNOSIS — I10 Essential (primary) hypertension: Secondary | ICD-10-CM

## 2019-09-21 DIAGNOSIS — I5032 Chronic diastolic (congestive) heart failure: Secondary | ICD-10-CM

## 2019-09-21 DIAGNOSIS — Z950 Presence of cardiac pacemaker: Secondary | ICD-10-CM

## 2019-09-21 LAB — CUP PACEART INCLINIC DEVICE CHECK
Battery Remaining Longevity: 96 mo
Battery Voltage: 3.03 V
Brady Statistic AS VP Percent: 64.64 %
Brady Statistic AS VS Percent: 0 %
Brady Statistic RV Percent Paced: 99.94 %
Date Time Interrogation Session: 20210614121200
Implantable Pulse Generator Implant Date: 20200901
Lead Channel Impedance Value: 540 Ohm
Lead Channel Pacing Threshold Amplitude: 0.5 V
Lead Channel Pacing Threshold Pulse Width: 0.24 ms
Lead Channel Sensing Intrinsic Amplitude: 11.813 mV
Lead Channel Setting Pacing Amplitude: 1.125
Lead Channel Setting Pacing Pulse Width: 0.24 ms
Lead Channel Setting Sensing Sensitivity: 2.8 mV

## 2019-09-21 NOTE — Patient Instructions (Signed)
Medication Instructions:  none *If you need a refill on your cardiac medications before your next appointment, please call your pharmacy*   Lab Work: none If you have labs (blood work) drawn today and your tests are completely normal, you will receive your results only by: Marland Kitchen MyChart Message (if you have MyChart) OR . A paper copy in the mail If you have any lab test that is abnormal or we need to change your treatment, we will call you to review the results.   Testing/Procedures: none   Follow-Up: At [facility], you and your health needs are our priority.  As part of our continuing mission to provide you with exceptional heart care, we have created designated Provider Care Teams.  These Care Teams include your primary Cardiologist (physician) and Advanced Practice Providers (APPs -  Physician Assistants and Nurse Practitioners) who all work together to provide you with the care you need, when you need it.  Your next appointment:   6 month(s)  The format for your next appointment:   Either In Person or Virtual  Provider:   Dr Rayann Heman   Other Instructions Remote monitoring is used to monitor your Pacemaker from home. This monitoring reduces the number of office visits required to check your device to one time per year. It allows [place] to keep an eye on the functioning of your device to ensure it is working properly. You are scheduled for a device check from home on 12/17/19. You may send your transmission at any time that day. If you have a wireless device, the transmission will be sent automatically. After your physician reviews your transmission, you will receive a postcard with your next transmission date.

## 2019-09-24 MED FILL — PROMACTA 25 MG TABLET: 25 | 30 days supply | Qty: 30 | Fill #5

## 2019-10-02 ENCOUNTER — Other Ambulatory Visit (HOSPITAL_COMMUNITY): Payer: Self-pay | Admitting: Internal Medicine

## 2019-10-02 ENCOUNTER — Other Ambulatory Visit: Payer: Self-pay | Admitting: Internal Medicine

## 2019-10-02 DIAGNOSIS — D692 Other nonthrombocytopenic purpura: Secondary | ICD-10-CM | POA: Insufficient documentation

## 2019-10-02 DIAGNOSIS — E441 Mild protein-calorie malnutrition: Secondary | ICD-10-CM | POA: Insufficient documentation

## 2019-10-02 DIAGNOSIS — R221 Localized swelling, mass and lump, neck: Secondary | ICD-10-CM

## 2019-10-02 HISTORY — DX: Other nonthrombocytopenic purpura: D69.2

## 2019-10-06 ENCOUNTER — Other Ambulatory Visit: Payer: Self-pay

## 2019-10-06 ENCOUNTER — Encounter: Payer: Self-pay | Admitting: Internal Medicine

## 2019-10-06 ENCOUNTER — Inpatient Hospital Stay: Payer: Medicare PPO

## 2019-10-06 ENCOUNTER — Inpatient Hospital Stay (HOSPITAL_BASED_OUTPATIENT_CLINIC_OR_DEPARTMENT_OTHER): Payer: Medicare PPO | Admitting: Internal Medicine

## 2019-10-06 DIAGNOSIS — C3431 Malignant neoplasm of lower lobe, right bronchus or lung: Secondary | ICD-10-CM

## 2019-10-06 LAB — CBC WITH DIFFERENTIAL/PLATELET
Abs Immature Granulocytes: 0.01 10*3/uL (ref 0.00–0.07)
Basophils Absolute: 0 10*3/uL (ref 0.0–0.1)
Basophils Relative: 1 %
Eosinophils Absolute: 0.2 10*3/uL (ref 0.0–0.5)
Eosinophils Relative: 5 %
HCT: 30.9 % — ABNORMAL LOW (ref 36.0–46.0)
Hemoglobin: 10.2 g/dL — ABNORMAL LOW (ref 12.0–15.0)
Immature Granulocytes: 0 %
Lymphocytes Relative: 27 %
Lymphs Abs: 1.1 10*3/uL (ref 0.7–4.0)
MCH: 30.8 pg (ref 26.0–34.0)
MCHC: 33 g/dL (ref 30.0–36.0)
MCV: 93.4 fL (ref 80.0–100.0)
Monocytes Absolute: 0.3 10*3/uL (ref 0.1–1.0)
Monocytes Relative: 7 %
Neutro Abs: 2.3 10*3/uL (ref 1.7–7.7)
Neutrophils Relative %: 60 %
Platelets: 114 10*3/uL — ABNORMAL LOW (ref 150–400)
RBC: 3.31 MIL/uL — ABNORMAL LOW (ref 3.87–5.11)
RDW: 13 % (ref 11.5–15.5)
WBC: 3.9 10*3/uL — ABNORMAL LOW (ref 4.0–10.5)
nRBC: 0 % (ref 0.0–0.2)

## 2019-10-06 LAB — COMPREHENSIVE METABOLIC PANEL
ALT: 20 U/L (ref 0–44)
AST: 23 U/L (ref 15–41)
Albumin: 3.9 g/dL (ref 3.5–5.0)
Alkaline Phosphatase: 51 U/L (ref 38–126)
Anion gap: 8 (ref 5–15)
BUN: 39 mg/dL — ABNORMAL HIGH (ref 8–23)
CO2: 25 mmol/L (ref 22–32)
Calcium: 8.8 mg/dL — ABNORMAL LOW (ref 8.9–10.3)
Chloride: 106 mmol/L (ref 98–111)
Creatinine, Ser: 1.6 mg/dL — ABNORMAL HIGH (ref 0.44–1.00)
GFR calc Af Amer: 35 mL/min — ABNORMAL LOW (ref >60)
GFR calc non Af Amer: 30 mL/min — ABNORMAL LOW (ref >60)
Glucose, Bld: 92 mg/dL (ref 70–99)
Potassium: 4.6 mmol/L (ref 3.5–5.1)
Sodium: 139 mmol/L (ref 135–145)
Total Bilirubin: 0.6 mg/dL (ref 0.3–1.2)
Total Protein: 6.9 g/dL (ref 6.5–8.1)
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
## 2019-10-06 NOTE — Assessment & Plan Note (Addendum)
#  Stage IV recurrent adenocarcinoma the lung; EGFR mutated. On  osimertinib 80 mg [Feb 6th 2020]. MARCH 2021- PET scan shows stable/small right-sided pleural effusion; no hypermetabolic noted; mild hypermetabolic noted in the stomach [see below] clinically  STABLE.    # currently on Osimertinib 80 mg/day; tolerating well.   # ITP- chronic- platelets ~ 114 on promacta 25 mg/day. STABLE.    # right sided stroke/ seizures-on asprin/Plavix; f/u neurology; STABLE.    # CKD- stage III Left Hydronephrosis s/p ureteral stent placement/nephrolithiasis-~ GFR 30 today; on K-citrate BID.   # Elevated BP- 120s- STABLE>   # weight loss- continue protein shakes.   # Covid vaccine-Long discussion with the patient regarding pros and cons of vaccination.  Given her clinical situation benefits of the vaccine outweigh the risk.  Patient is now willing to proceed with vaccination.  # DISPOSITION: # follow up in 4 weeks;  MD;labs- cbc/cmp;Dr.B

## 2019-10-09 ENCOUNTER — Other Ambulatory Visit: Payer: Self-pay

## 2019-10-09 ENCOUNTER — Ambulatory Visit
Admission: RE | Admit: 2019-10-09 | Discharge: 2019-10-09 | Disposition: A | Discharge status: Home / Self Care | Payer: Medicare PPO | Source: Ambulatory Visit | Attending: Internal Medicine | Admitting: Internal Medicine

## 2019-10-09 DIAGNOSIS — R221 Localized swelling, mass and lump, neck: Secondary | ICD-10-CM | POA: Insufficient documentation

## 2019-10-09 HISTORY — DX: Disorder of kidney and ureter, unspecified: N28.9

## 2019-10-09 MED ORDER — IOHEXOL 300 MG/ML  SOLN
50.0000 mL | Freq: Once | INTRAMUSCULAR | Status: AC | PRN
  Administered 2019-10-09: 60 mL via INTRAVENOUS

## 2019-10-19 ENCOUNTER — Other Ambulatory Visit: Payer: Self-pay | Admitting: Physician Assistant

## 2019-10-19 DIAGNOSIS — Z952 Presence of prosthetic heart valve: Secondary | ICD-10-CM

## 2019-10-20 MED FILL — PROMACTA 25 MG TABLET: 25 | 30 days supply | Qty: 30 | Fill #6

## 2019-11-03 ENCOUNTER — Inpatient Hospital Stay: Payer: Medicare PPO | Attending: Internal Medicine

## 2019-11-03 ENCOUNTER — Inpatient Hospital Stay: Payer: Medicare PPO | Admitting: Internal Medicine

## 2019-11-03 ENCOUNTER — Other Ambulatory Visit: Payer: Self-pay

## 2019-11-03 DIAGNOSIS — K219 Gastro-esophageal reflux disease without esophagitis: Secondary | ICD-10-CM | POA: Insufficient documentation

## 2019-11-03 DIAGNOSIS — E1122 Type 2 diabetes mellitus with diabetic chronic kidney disease: Secondary | ICD-10-CM | POA: Diagnosis not present

## 2019-11-03 DIAGNOSIS — Z7982 Long term (current) use of aspirin: Secondary | ICD-10-CM | POA: Insufficient documentation

## 2019-11-03 DIAGNOSIS — M129 Arthropathy, unspecified: Secondary | ICD-10-CM | POA: Insufficient documentation

## 2019-11-03 DIAGNOSIS — Z8673 Personal history of transient ischemic attack (TIA), and cerebral infarction without residual deficits: Secondary | ICD-10-CM | POA: Insufficient documentation

## 2019-11-03 DIAGNOSIS — J90 Pleural effusion, not elsewhere classified: Secondary | ICD-10-CM | POA: Diagnosis not present

## 2019-11-03 DIAGNOSIS — K2940 Chronic atrophic gastritis without bleeding: Secondary | ICD-10-CM | POA: Insufficient documentation

## 2019-11-03 DIAGNOSIS — C3431 Malignant neoplasm of lower lobe, right bronchus or lung: Secondary | ICD-10-CM | POA: Insufficient documentation

## 2019-11-03 DIAGNOSIS — Z87442 Personal history of urinary calculi: Secondary | ICD-10-CM | POA: Insufficient documentation

## 2019-11-03 DIAGNOSIS — E538 Deficiency of other specified B group vitamins: Secondary | ICD-10-CM | POA: Insufficient documentation

## 2019-11-03 DIAGNOSIS — I2510 Atherosclerotic heart disease of native coronary artery without angina pectoris: Secondary | ICD-10-CM | POA: Diagnosis not present

## 2019-11-03 DIAGNOSIS — I10 Essential (primary) hypertension: Secondary | ICD-10-CM | POA: Diagnosis not present

## 2019-11-03 DIAGNOSIS — M810 Age-related osteoporosis without current pathological fracture: Secondary | ICD-10-CM | POA: Diagnosis not present

## 2019-11-03 DIAGNOSIS — Z7902 Long term (current) use of antithrombotics/antiplatelets: Secondary | ICD-10-CM | POA: Diagnosis not present

## 2019-11-03 DIAGNOSIS — R634 Abnormal weight loss: Secondary | ICD-10-CM | POA: Diagnosis not present

## 2019-11-03 DIAGNOSIS — Z79899 Other long term (current) drug therapy: Secondary | ICD-10-CM | POA: Insufficient documentation

## 2019-11-03 DIAGNOSIS — E785 Hyperlipidemia, unspecified: Secondary | ICD-10-CM | POA: Insufficient documentation

## 2019-11-03 DIAGNOSIS — D693 Immune thrombocytopenic purpura: Secondary | ICD-10-CM | POA: Insufficient documentation

## 2019-11-03 LAB — COMPREHENSIVE METABOLIC PANEL
ALT: 47 U/L — ABNORMAL HIGH (ref 0–44)
AST: 34 U/L (ref 15–41)
Albumin: 4.4 g/dL (ref 3.5–5.0)
Alkaline Phosphatase: 63 U/L (ref 38–126)
Anion gap: 9 (ref 5–15)
BUN: 47 mg/dL — ABNORMAL HIGH (ref 8–23)
CO2: 23 mmol/L (ref 22–32)
Calcium: 8.7 mg/dL — ABNORMAL LOW (ref 8.9–10.3)
Chloride: 108 mmol/L (ref 98–111)
Creatinine, Ser: 2.03 mg/dL — ABNORMAL HIGH (ref 0.44–1.00)
GFR calc Af Amer: 26 mL/min — ABNORMAL LOW (ref >60)
GFR calc non Af Amer: 22 mL/min — ABNORMAL LOW (ref >60)
Glucose, Bld: 93 mg/dL (ref 70–99)
Potassium: 5.1 mmol/L (ref 3.5–5.1)
Sodium: 140 mmol/L (ref 135–145)
Total Bilirubin: 0.6 mg/dL (ref 0.3–1.2)
Total Protein: 6.9 g/dL (ref 6.5–8.1)

## 2019-11-03 LAB — CBC WITH DIFFERENTIAL/PLATELET
Abs Immature Granulocytes: 0.02 10*3/uL (ref 0.00–0.07)
Basophils Absolute: 0 10*3/uL (ref 0.0–0.1)
Basophils Relative: 0 %
Eosinophils Absolute: 0.1 10*3/uL (ref 0.0–0.5)
Eosinophils Relative: 3 %
HCT: 30 % — ABNORMAL LOW (ref 36.0–46.0)
Hemoglobin: 9.8 g/dL — ABNORMAL LOW (ref 12.0–15.0)
Immature Granulocytes: 0 %
Lymphocytes Relative: 15 %
Lymphs Abs: 0.7 10*3/uL (ref 0.7–4.0)
MCH: 30.5 pg (ref 26.0–34.0)
MCHC: 32.7 g/dL (ref 30.0–36.0)
MCV: 93.5 fL (ref 80.0–100.0)
Monocytes Absolute: 0.2 10*3/uL (ref 0.1–1.0)
Monocytes Relative: 5 %
Neutro Abs: 3.7 10*3/uL (ref 1.7–7.7)
Neutrophils Relative %: 77 %
Platelets: 85 10*3/uL — ABNORMAL LOW (ref 150–400)
RBC: 3.21 MIL/uL — ABNORMAL LOW (ref 3.87–5.11)
RDW: 12.5 % (ref 11.5–15.5)
WBC: 4.8 10*3/uL (ref 4.0–10.5)
nRBC: 0 % (ref 0.0–0.2)

## 2019-11-03 MED ORDER — POTASSIUM CITRATE ER 10 MEQ (1080 MG) PO TBCR: 10.0000 meq | EXTENDED_RELEASE_TABLET | Freq: Two times a day (BID) | ORAL | 2 refills | Status: DC
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
## 2019-11-03 NOTE — Assessment & Plan Note (Signed)
#  Stage IV recurrent adenocarcinoma the lung; EGFR mutated. On  osimertinib 80 mg [Feb 6th 2020]. MARCH 2021- PET scan shows stable/small right-sided pleural effusion; no hypermetabolic noted; mild hypermetabolic noted in the stomach [see below] clinically  STABLE.    # currently on Osimertinib 80 mg/day; tolerating well. Will order imaging/PET at next visit.    # ITP- chronic- platelets ~ 85 on promacta 25 mg/day. STABLE.    # right sided stroke/ seizures-on asprin/Plavix; f/u neurology; STABLE.     # CKD- stage IV; Left Hydronephrosis s/p ureteral stent placement/nephrolithiasis-~ GFR 22 today; on K-citrate BID; new script sent.    # weight loss- continue protein shakes.   # Covid vaccine-Given her clinical situation benefits of the vaccine outweigh the risk; awaiting vaccination today.   # DISPOSITION: # follow up in 4 weeks;  MD;labs- cbc/cmp;Dr.B

## 2019-11-10 IMAGING — CT NM PET TUM IMG RESTAG (PS) SKULL BASE T - THIGH
1 of 9 series · 1 of 25 positions shown · non-contrast
Comparison: 09/04/2018 PET-CT.  06/30/2018 chest CT.

CLINICAL DATA: Subsequent treatment strategy for right lower lobe
lung adenocarcinoma originally status post right lower lobectomy
05/09/2015, with recurrence diagnosed on multiple right
thoracentesis procedures, most recent 09/12/2018. Ongoing
chemotherapy. Additional history of gastric cancer status post
partial gastrectomy July 2016.

EXAM:
NUCLEAR MEDICINE PET SKULL BASE TO THIGH
TECHNIQUE: 5.4 mCi F-18 FDG was injected intravenously. Full-ring PET imaging
was performed from the skull base to thigh after the radiotracer. CT
data was obtained and used for attenuation correction and anatomic
localization.
Fasting blood glucose: 78 mg/dl

[Series 3: ct wb 5.0 b30f · axial · 5.0mm · 0.98mm/px · 1 of 290 slices shown]
[im 290/290  brain]
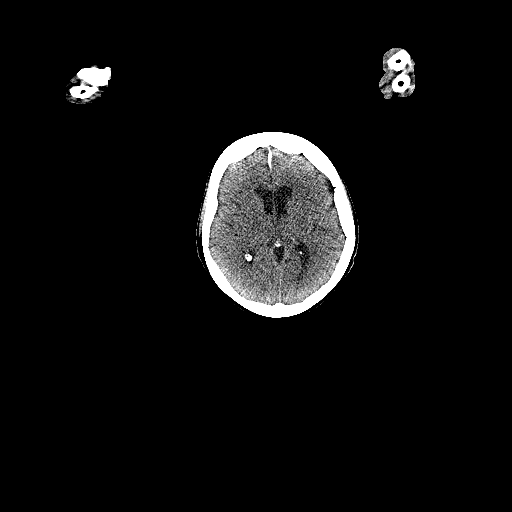

[1 of 25 positions shown; findings below may reference images not displayed]

FINDINGS: Mediastinal blood pool activity: SUV max

Liver activity: SUV max NA

NECK: No hypermetabolic lymph nodes in the neck.

Incidental CT findings: Stable subcentimeter hypodense bilateral
thyroid nodules, non-hypermetabolic.

CHEST: No enlarged or hypermetabolic axillary, mediastinal or hilar
lymph nodes.

No hypermetabolic pulmonary findings. Small dependent/basilar right
pleural effusion with mild right pleural thickening, slightly
decreased in volume since 09/04/2018 PET-CT, without pleural
hypermetabolism or discrete pleural nodularity.

Incidental CT findings: Aortic valve prosthesis in place.
Three-vessel coronary atherosclerosis. Mild cardiomegaly.
Atherosclerotic nonaneurysmal thoracic aorta. Right lower lobectomy.
Stable somewhat bandlike subpleural consolidation in the basilar
right middle lobe with associated calcifications, non
hypermetabolic, compatible with chronic pleural-parenchymal
scarring. No significant pulmonary nodules.

ABDOMEN/PELVIS: No abnormal hypermetabolic activity within the
liver, pancreas, adrenal glands, or spleen. No hypermetabolic lymph
nodes in the abdomen or pelvis.

Status post partial distal gastrectomy. Hypermetabolism throughout
the remnant proximal stomach without definite gastric wall
thickening or discrete gastric mass on the CT images.

Incidental CT findings: Non-obstructing 16 mm lower left renal
stone. Atherosclerotic nonaneurysmal abdominal aorta. Hysterectomy.

SKELETON: No focal hypermetabolic activity to suggest skeletal
metastasis.

Incidental CT findings: none
IMPRESSION: 1. No hypermetabolic metastatic disease.
2. Small dependent/basilar right pleural effusion with mild right
pleural thickening, decreased, without pleural hypermetabolism or
discrete pleural nodularity.
3. Nonspecific hypermetabolism throughout the remnant proximal
stomach, without CT correlate, favoring physiologic activity.
4.  Aortic Atherosclerosis ([id number]).

## 2019-11-13 ENCOUNTER — Other Ambulatory Visit: Payer: Self-pay | Admitting: Internal Medicine

## 2019-11-13 DIAGNOSIS — C3431 Malignant neoplasm of lower lobe, right bronchus or lung: Secondary | ICD-10-CM

## 2019-11-17 MED FILL — PROMACTA 25 MG TABLET: 25 | 30 days supply | Qty: 30 | Fill #0

## 2019-11-24 ENCOUNTER — Telehealth: Payer: Self-pay

## 2019-11-24 NOTE — Telephone Encounter (Signed)
Due to provider illness, the patient will keep echo appointment this Thursday and switch to virtual visit this Friday with Nell Range. She will have vital signs ready when Katie calls.  Consent obtained.      Patient Consent for Virtual Visit   { CONSENT FOR VIRTUAL VISIT FOR:  Stacy Cook  By participating in this virtual visit I agree to the following:  I hereby voluntarily request, consent and authorize [facility] and its employed or contracted physicians, physician assistants, nurse practitioners or other licensed health care professionals (the Practitioner), to provide me with telemedicine health care services (the "Services") as deemed necessary by the treating Practitioner. I acknowledge and consent to receive the Services by the Practitioner via telemedicine. I understand that the telemedicine visit will involve communicating with the Practitioner through live audiovisual communication technology and the disclosure of certain medical information by electronic transmission. I acknowledge that I have been given the opportunity to request an in-person assessment or other available alternative prior to the telemedicine visit and am voluntarily participating in the telemedicine visit.  I understand that I have the right to withhold or withdraw my consent to the use of telemedicine in the course of my care at any time, without affecting my right to future care or treatment, and that the Practitioner or I may terminate the telemedicine visit at any time. I understand that I have the right to inspect all information obtained and/or recorded in the course of the telemedicine visit and may receive copies of available information for a reasonable fee.  I understand that some of the potential risks of receiving the Services via telemedicine include:  Marland Kitchen Delay or interruption in medical evaluation due to technological equipment failure or disruption; . Information transmitted may not be  sufficient (e.g. poor resolution of images) to allow for appropriate medical decision making by the Practitioner; and/or  . In rare instances, security protocols could fail, causing a breach of personal health information.  Furthermore, I acknowledge that it is my responsibility to provide information about my medical history, conditions and care that is complete and accurate to the best of my ability. I acknowledge that Practitioner's advice, recommendations, and/or decision may be based on factors not within their control, such as incomplete or inaccurate data provided by me or distortions of diagnostic images or specimens that may result from electronic transmissions. I understand that the practice of medicine is not an exact science and that Practitioner makes no warranties or guarantees regarding treatment outcomes. I acknowledge that a copy of this consent can be made available to me via my patient portal ([facility]), or I can request a printed copy by calling the office of [facility].    I understand that my insurance will be billed for this visit.   I have read or had this consent read to me. . I understand the contents of this consent, which adequately explains the benefits and risks of the Services being provided via telemedicine.  . I have been provided ample opportunity to ask questions regarding this consent and the Services and have had my questions answered to my satisfaction. . I give my informed consent for the services to be provided through the use of telemedicine in my medical care

## 2019-11-26 ENCOUNTER — Ambulatory Visit: Payer: Medicare PPO | Admitting: Physician Assistant

## 2019-11-26 ENCOUNTER — Other Ambulatory Visit: Payer: Self-pay

## 2019-11-26 ENCOUNTER — Ambulatory Visit (HOSPITAL_COMMUNITY): Payer: Medicare PPO | Attending: Cardiology

## 2019-11-26 DIAGNOSIS — Z952 Presence of prosthetic heart valve: Secondary | ICD-10-CM | POA: Diagnosis present

## 2019-11-26 LAB — ECHOCARDIOGRAM COMPLETE
AR max vel: 1.05 cm2
AV Area VTI: 1.2 cm2
AV Area mean vel: 1.06 cm2
AV Mean grad: 10 mmHg
AV Peak grad: 18.3 mmHg
Ao pk vel: 2.14 m/s
Area-P 1/2: 1.74 cm2
P 1/2 time: 267 msec
S' Lateral: 2.1 cm
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
## 2019-11-27 ENCOUNTER — Telehealth (INDEPENDENT_AMBULATORY_CARE_PROVIDER_SITE_OTHER): Payer: Medicare PPO | Admitting: Physician Assistant

## 2019-11-27 DIAGNOSIS — Z952 Presence of prosthetic heart valve: Secondary | ICD-10-CM | POA: Diagnosis not present

## 2019-11-27 MED ORDER — AMOXICILLIN 500 MG PO TABS
2000.0000 mg | ORAL_TABLET | ORAL | 12 refills | Status: DC
Start: 2019-11-27 — End: 2020-09-06

## 2019-11-27 NOTE — Patient Instructions (Signed)
Stop plavix at this time. Continue on all other medications. Remember to take antibiotics before any dental work    Angelena Form PA-C  [facility]

## 2019-12-01 ENCOUNTER — Inpatient Hospital Stay: Payer: Medicare PPO | Attending: Internal Medicine

## 2019-12-01 ENCOUNTER — Other Ambulatory Visit: Payer: Self-pay

## 2019-12-01 ENCOUNTER — Inpatient Hospital Stay: Payer: Medicare PPO | Admitting: Internal Medicine

## 2019-12-01 ENCOUNTER — Encounter: Payer: Self-pay | Admitting: Internal Medicine

## 2019-12-01 DIAGNOSIS — C3431 Malignant neoplasm of lower lobe, right bronchus or lung: Secondary | ICD-10-CM

## 2019-12-01 DIAGNOSIS — I2510 Atherosclerotic heart disease of native coronary artery without angina pectoris: Secondary | ICD-10-CM | POA: Diagnosis not present

## 2019-12-01 DIAGNOSIS — Z7982 Long term (current) use of aspirin: Secondary | ICD-10-CM | POA: Insufficient documentation

## 2019-12-01 DIAGNOSIS — Z8719 Personal history of other diseases of the digestive system: Secondary | ICD-10-CM | POA: Diagnosis not present

## 2019-12-01 DIAGNOSIS — D693 Immune thrombocytopenic purpura: Secondary | ICD-10-CM | POA: Insufficient documentation

## 2019-12-01 DIAGNOSIS — N179 Acute kidney failure, unspecified: Secondary | ICD-10-CM | POA: Insufficient documentation

## 2019-12-01 DIAGNOSIS — E119 Type 2 diabetes mellitus without complications: Secondary | ICD-10-CM | POA: Insufficient documentation

## 2019-12-01 DIAGNOSIS — Z79899 Other long term (current) drug therapy: Secondary | ICD-10-CM | POA: Diagnosis not present

## 2019-12-01 DIAGNOSIS — Z87442 Personal history of urinary calculi: Secondary | ICD-10-CM | POA: Diagnosis not present

## 2019-12-01 DIAGNOSIS — Z8673 Personal history of transient ischemic attack (TIA), and cerebral infarction without residual deficits: Secondary | ICD-10-CM | POA: Insufficient documentation

## 2019-12-01 DIAGNOSIS — M129 Arthropathy, unspecified: Secondary | ICD-10-CM | POA: Insufficient documentation

## 2019-12-01 DIAGNOSIS — Z85020 Personal history of malignant carcinoid tumor of stomach: Secondary | ICD-10-CM | POA: Insufficient documentation

## 2019-12-01 DIAGNOSIS — K219 Gastro-esophageal reflux disease without esophagitis: Secondary | ICD-10-CM | POA: Insufficient documentation

## 2019-12-01 DIAGNOSIS — E785 Hyperlipidemia, unspecified: Secondary | ICD-10-CM | POA: Insufficient documentation

## 2019-12-01 DIAGNOSIS — M810 Age-related osteoporosis without current pathological fracture: Secondary | ICD-10-CM | POA: Diagnosis not present

## 2019-12-01 DIAGNOSIS — I10 Essential (primary) hypertension: Secondary | ICD-10-CM | POA: Insufficient documentation

## 2019-12-01 LAB — CBC WITH DIFFERENTIAL/PLATELET
Abs Immature Granulocytes: 0.01 10*3/uL (ref 0.00–0.07)
Basophils Absolute: 0 10*3/uL (ref 0.0–0.1)
Basophils Relative: 0 %
Eosinophils Absolute: 0.1 10*3/uL (ref 0.0–0.5)
Eosinophils Relative: 2 %
HCT: 28.5 % — ABNORMAL LOW (ref 36.0–46.0)
Hemoglobin: 9.8 g/dL — ABNORMAL LOW (ref 12.0–15.0)
Immature Granulocytes: 0 %
Lymphocytes Relative: 26 %
Lymphs Abs: 1.2 10*3/uL (ref 0.7–4.0)
MCH: 31.1 pg (ref 26.0–34.0)
MCHC: 34.4 g/dL (ref 30.0–36.0)
MCV: 90.5 fL (ref 80.0–100.0)
Monocytes Absolute: 0.3 10*3/uL (ref 0.1–1.0)
Monocytes Relative: 6 %
Neutro Abs: 3 10*3/uL (ref 1.7–7.7)
Neutrophils Relative %: 66 %
Platelets: 109 10*3/uL — ABNORMAL LOW (ref 150–400)
RBC: 3.15 MIL/uL — ABNORMAL LOW (ref 3.87–5.11)
RDW: 13.2 % (ref 11.5–15.5)
WBC: 4.6 10*3/uL (ref 4.0–10.5)
nRBC: 0 % (ref 0.0–0.2)

## 2019-12-01 LAB — COMPREHENSIVE METABOLIC PANEL WITH GFR
ALT: 60 U/L — ABNORMAL HIGH (ref 0–44)
AST: 40 U/L (ref 15–41)
Albumin: 4.1 g/dL (ref 3.5–5.0)
Alkaline Phosphatase: 62 U/L (ref 38–126)
Anion gap: 8 (ref 5–15)
BUN: 47 mg/dL — ABNORMAL HIGH (ref 8–23)
CO2: 22 mmol/L (ref 22–32)
Calcium: 8.5 mg/dL — ABNORMAL LOW (ref 8.9–10.3)
Chloride: 109 mmol/L (ref 98–111)
Creatinine, Ser: 1.99 mg/dL — ABNORMAL HIGH (ref 0.44–1.00)
GFR calc Af Amer: 27 mL/min — ABNORMAL LOW
GFR calc non Af Amer: 23 mL/min — ABNORMAL LOW
Glucose, Bld: 90 mg/dL (ref 70–99)
Potassium: 4.5 mmol/L (ref 3.5–5.1)
Sodium: 139 mmol/L (ref 135–145)
Total Bilirubin: 0.5 mg/dL (ref 0.3–1.2)
Total Protein: 6.8 g/dL (ref 6.5–8.1)
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
## 2019-12-01 NOTE — Assessment & Plan Note (Addendum)
#  Stage IV recurrent adenocarcinoma the lung; EGFR mutated. On  osimertinib 80 mg [Feb 6th 2020]. MARCH 2021- PET scan shows stable/small right-sided pleural effusion; no hypermetabolic noted; mild hypermetabolic noted in the stomach [see below] clinically  STABLE.   # currently on Osimertinib 80 mg/day; tolerating well. Will order imaging/PET at next visit.    # ITP- chronic- platelets ~ 109- on promacta 25 mg/day. STABLE.    # MSK anterior chest wall pain- take tylenol prn- also await PET scan as ordered.   # right sided stroke/ seizures-on asprin; off plavix;; f/u neurology-[place]; STABLE.      # CKD- stage IV; Left Hydronephrosis s/p ureteral stent placement/nephrolithiasis-~ GFR 22 today; on K-citrate BID; STABLE.    # weight loss; STABLE;  continue protein shakes.   # Covid vaccine- s/p #1-tolerated well so far; awaiting #2 today-   # DISPOSITION: # follow up in 4 weeks;  MD;labs- cbc/cmp; PET scan prior- Dr.B      

## 2019-12-10 ENCOUNTER — Telehealth: Payer: Self-pay | Admitting: Pharmacy Technician

## 2019-12-10 NOTE — Telephone Encounter (Signed)
Oral Oncology Patient Advocate Encounter  Prior Authorization for [employer]y [employer] has been approved.    PA# [id number] Effective dates: 12/10/19 through 06/07/20  Patients co-pay is $100.00  Oral Oncology Clinic will continue to follow.   [place] Patient [place] Phone [phone] Fax [phone] 12/10/2019 3:11 PM

## 2019-12-10 NOTE — Telephone Encounter (Signed)
Oral Oncology Patient Advocate Encounter  Received notification from [facility] that the existing prior authorization for Promacta is due for renewal.  Renewal PA submitted on CoverMyMeds Key [id number]  Status is pending  Oral Oncology Clinic will continue to follow.  [place] Patient [place] Phone [phone] Fax [phone] 12/10/2019 3:10 PM

## 2019-12-16 ENCOUNTER — Ambulatory Visit: Payer: Medicare PPO | Admitting: Adult Health

## 2019-12-17 ENCOUNTER — Ambulatory Visit (INDEPENDENT_AMBULATORY_CARE_PROVIDER_SITE_OTHER): Payer: Medicare PPO | Admitting: *Deleted

## 2019-12-17 DIAGNOSIS — I459 Conduction disorder, unspecified: Secondary | ICD-10-CM | POA: Diagnosis not present

## 2019-12-17 LAB — CUP PACEART REMOTE DEVICE CHECK
Battery Remaining Longevity: 96 mo
Battery Voltage: 3.02 V
Brady Statistic AS VP Percent: 65.68 %
Brady Statistic AS VS Percent: 0 %
Brady Statistic RV Percent Paced: 99.97 %
Date Time Interrogation Session: 20210909080600
Implantable Pulse Generator Implant Date: 20200901
Lead Channel Impedance Value: 510 Ohm
Lead Channel Pacing Threshold Amplitude: 0.5 V
Lead Channel Pacing Threshold Pulse Width: 0.24 ms
Lead Channel Sensing Intrinsic Amplitude: 18.788 mV
Lead Channel Setting Pacing Amplitude: 1 V
Lead Channel Setting Pacing Pulse Width: 0.24 ms
Lead Channel Setting Sensing Sensitivity: 2.8 mV

## 2019-12-18 NOTE — Progress Notes (Signed)
Remote pacemaker transmission.   

## 2019-12-22 ENCOUNTER — Telehealth: Payer: Self-pay | Admitting: *Deleted

## 2019-12-22 NOTE — Telephone Encounter (Signed)
-----   Message from Loa Socks sent at 12/22/2019  1:34 PM EDT ----- Regarding: PET Pending Please cancel PET asap, auth pending. TY

## 2019-12-22 NOTE — Telephone Encounter (Signed)
Spoke with patient. Patient aware that pet scan is cnl. New apts for Dr. Rogue Bussing provided to patient. We will keep her updated on her pet scan approval.

## 2019-12-23 ENCOUNTER — Telehealth: Payer: Self-pay | Admitting: Internal Medicine

## 2019-12-23 NOTE — Telephone Encounter (Signed)
12/23/2019  Called and informed pt that her PET scan has been authorized with insurance and r/s for 9/23 @ 8:30 SRW

## 2019-12-23 NOTE — Telephone Encounter (Signed)
Patient's pet can r/s. She is aware that insurance now approved her scan.

## 2019-12-24 ENCOUNTER — Ambulatory Visit: Payer: Medicare PPO

## 2019-12-29 ENCOUNTER — Ambulatory Visit: Payer: Medicare PPO | Admitting: Internal Medicine

## 2019-12-29 ENCOUNTER — Other Ambulatory Visit: Payer: Medicare PPO

## 2019-12-31 ENCOUNTER — Other Ambulatory Visit: Payer: Self-pay

## 2019-12-31 ENCOUNTER — Ambulatory Visit
Admission: RE | Admit: 2019-12-31 | Discharge: 2019-12-31 | Disposition: A | Discharge status: Home / Self Care | Payer: Medicare PPO | Source: Ambulatory Visit | Attending: Internal Medicine | Admitting: Internal Medicine

## 2019-12-31 DIAGNOSIS — C3431 Malignant neoplasm of lower lobe, right bronchus or lung: Secondary | ICD-10-CM | POA: Diagnosis present

## 2019-12-31 DIAGNOSIS — I700 Atherosclerosis of aorta: Secondary | ICD-10-CM | POA: Diagnosis not present

## 2019-12-31 DIAGNOSIS — J90 Pleural effusion, not elsewhere classified: Secondary | ICD-10-CM | POA: Diagnosis not present

## 2019-12-31 LAB — GLUCOSE, CAPILLARY: Glucose-Capillary: 83 mg/dL (ref 70–99)

## 2019-12-31 MED ORDER — FLUDEOXYGLUCOSE F - 18 (FDG) INJECTION
5.5000 | Freq: Once | INTRAVENOUS | Status: AC | PRN
  Administered 2019-12-31: 5.85 via INTRAVENOUS

## 2020-01-05 ENCOUNTER — Inpatient Hospital Stay: Payer: Medicare PPO | Admitting: Internal Medicine

## 2020-01-05 ENCOUNTER — Other Ambulatory Visit: Payer: Self-pay

## 2020-01-05 ENCOUNTER — Encounter: Payer: Self-pay | Admitting: Internal Medicine

## 2020-01-05 ENCOUNTER — Inpatient Hospital Stay: Payer: Medicare PPO | Attending: Internal Medicine

## 2020-01-05 DIAGNOSIS — K219 Gastro-esophageal reflux disease without esophagitis: Secondary | ICD-10-CM | POA: Diagnosis not present

## 2020-01-05 DIAGNOSIS — D693 Immune thrombocytopenic purpura: Secondary | ICD-10-CM | POA: Insufficient documentation

## 2020-01-05 DIAGNOSIS — I10 Essential (primary) hypertension: Secondary | ICD-10-CM | POA: Diagnosis not present

## 2020-01-05 DIAGNOSIS — Z85828 Personal history of other malignant neoplasm of skin: Secondary | ICD-10-CM | POA: Diagnosis not present

## 2020-01-05 DIAGNOSIS — Z7982 Long term (current) use of aspirin: Secondary | ICD-10-CM | POA: Insufficient documentation

## 2020-01-05 DIAGNOSIS — C3431 Malignant neoplasm of lower lobe, right bronchus or lung: Secondary | ICD-10-CM | POA: Insufficient documentation

## 2020-01-05 DIAGNOSIS — E1122 Type 2 diabetes mellitus with diabetic chronic kidney disease: Secondary | ICD-10-CM | POA: Insufficient documentation

## 2020-01-05 DIAGNOSIS — K2940 Chronic atrophic gastritis without bleeding: Secondary | ICD-10-CM | POA: Diagnosis not present

## 2020-01-05 DIAGNOSIS — R634 Abnormal weight loss: Secondary | ICD-10-CM | POA: Diagnosis not present

## 2020-01-05 DIAGNOSIS — M129 Arthropathy, unspecified: Secondary | ICD-10-CM | POA: Insufficient documentation

## 2020-01-05 DIAGNOSIS — E785 Hyperlipidemia, unspecified: Secondary | ICD-10-CM | POA: Insufficient documentation

## 2020-01-05 DIAGNOSIS — Z79899 Other long term (current) drug therapy: Secondary | ICD-10-CM | POA: Diagnosis not present

## 2020-01-05 DIAGNOSIS — Z87442 Personal history of urinary calculi: Secondary | ICD-10-CM | POA: Diagnosis not present

## 2020-01-05 DIAGNOSIS — G40909 Epilepsy, unspecified, not intractable, without status epilepticus: Secondary | ICD-10-CM | POA: Diagnosis not present

## 2020-01-05 DIAGNOSIS — I2510 Atherosclerotic heart disease of native coronary artery without angina pectoris: Secondary | ICD-10-CM | POA: Insufficient documentation

## 2020-01-05 DIAGNOSIS — Z8673 Personal history of transient ischemic attack (TIA), and cerebral infarction without residual deficits: Secondary | ICD-10-CM | POA: Insufficient documentation

## 2020-01-05 LAB — COMPREHENSIVE METABOLIC PANEL
ALT: 15 U/L (ref 0–44)
AST: 18 U/L (ref 15–41)
Albumin: 4.1 g/dL (ref 3.5–5.0)
Alkaline Phosphatase: 51 U/L (ref 38–126)
Anion gap: 8 (ref 5–15)
BUN: 41 mg/dL — ABNORMAL HIGH (ref 8–23)
CO2: 25 mmol/L (ref 22–32)
Calcium: 8.9 mg/dL (ref 8.9–10.3)
Chloride: 107 mmol/L (ref 98–111)
Creatinine, Ser: 1.87 mg/dL — ABNORMAL HIGH (ref 0.44–1.00)
GFR calc Af Amer: 29 mL/min — ABNORMAL LOW (ref >60)
GFR calc non Af Amer: 25 mL/min — ABNORMAL LOW (ref >60)
Glucose, Bld: 119 mg/dL — ABNORMAL HIGH (ref 70–99)
Potassium: 4.2 mmol/L (ref 3.5–5.1)
Sodium: 140 mmol/L (ref 135–145)
Total Bilirubin: 0.6 mg/dL (ref 0.3–1.2)
Total Protein: 6.5 g/dL (ref 6.5–8.1)

## 2020-01-05 LAB — CBC WITH DIFFERENTIAL/PLATELET
Abs Immature Granulocytes: 0.01 10*3/uL (ref 0.00–0.07)
Basophils Absolute: 0 10*3/uL (ref 0.0–0.1)
Basophils Relative: 1 %
Eosinophils Absolute: 0.2 10*3/uL (ref 0.0–0.5)
Eosinophils Relative: 5 %
HCT: 27.7 % — ABNORMAL LOW (ref 36.0–46.0)
Hemoglobin: 9.4 g/dL — ABNORMAL LOW (ref 12.0–15.0)
Immature Granulocytes: 0 %
Lymphocytes Relative: 24 %
Lymphs Abs: 0.8 10*3/uL (ref 0.7–4.0)
MCH: 31.2 pg (ref 26.0–34.0)
MCHC: 33.9 g/dL (ref 30.0–36.0)
MCV: 92 fL (ref 80.0–100.0)
Monocytes Absolute: 0.3 10*3/uL (ref 0.1–1.0)
Monocytes Relative: 8 %
Neutro Abs: 2.1 10*3/uL (ref 1.7–7.7)
Neutrophils Relative %: 62 %
Platelets: 100 10*3/uL — ABNORMAL LOW (ref 150–400)
RBC: 3.01 MIL/uL — ABNORMAL LOW (ref 3.87–5.11)
RDW: 13.6 % (ref 11.5–15.5)
WBC: 3.4 10*3/uL — ABNORMAL LOW (ref 4.0–10.5)
nRBC: 0 % (ref 0.0–0.2)
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
## 2020-01-05 NOTE — Assessment & Plan Note (Addendum)
#  Stage IV recurrent adenocarcinoma the lung; EGFR mutated. On  osimertinib 80 mg [Feb 6th 2020]. SEP 2021-PET scan shows stable/small right-sided pleural effusion; no hypermetabolic noted; no distant metastatic disease  # currently on Osimertinib 80 mg/day; tolerating well.  # ITP- chronic- platelets ~ 110- on promacta 25 mg/day. STABLE  #Stomach cancer stage I status post gastrectomy; PET scan-no evidence of recurrence.  Stable.  # right sided stroke/ seizures-on asprin; off plavix;; f/u neurology-[place]; STABLE. Defer to PCP regarding further follow-up with neurology..    # CKD- stage IV; Left Hydronephrosis s/p ureteral stent placement/nephrolithiasis-~ GFR 25 today; on K-citrate BID; STABLE.   # weight loss; STABLE;  continue protein shakes.   # DISPOSITION: # follow up in 1 month;  MD;labs- cbc/cmp;  Dr.B  # I reviewed the blood work- with the patient in detail; also reviewed the imaging independently [as summarized above]; and with the patient in detail.

## 2020-01-06 MED FILL — PROMACTA 25 MG TABLET: 25 | 30 days supply | Qty: 30 | Fill #1

## 2020-01-26 ENCOUNTER — Other Ambulatory Visit: Payer: Self-pay | Admitting: Cardiovascular Disease

## 2020-02-02 ENCOUNTER — Inpatient Hospital Stay: Payer: Medicare PPO

## 2020-02-02 ENCOUNTER — Inpatient Hospital Stay: Payer: Medicare PPO | Attending: Internal Medicine

## 2020-02-02 ENCOUNTER — Other Ambulatory Visit: Payer: Self-pay | Admitting: *Deleted

## 2020-02-02 ENCOUNTER — Inpatient Hospital Stay: Payer: Medicare PPO | Admitting: Internal Medicine

## 2020-02-02 ENCOUNTER — Encounter: Payer: Self-pay | Admitting: Internal Medicine

## 2020-02-02 ENCOUNTER — Other Ambulatory Visit: Payer: Self-pay | Admitting: Pharmacist

## 2020-02-02 ENCOUNTER — Other Ambulatory Visit: Payer: Self-pay

## 2020-02-02 DIAGNOSIS — E785 Hyperlipidemia, unspecified: Secondary | ICD-10-CM | POA: Diagnosis not present

## 2020-02-02 DIAGNOSIS — Z23 Encounter for immunization: Secondary | ICD-10-CM | POA: Diagnosis not present

## 2020-02-02 DIAGNOSIS — Z85028 Personal history of other malignant neoplasm of stomach: Secondary | ICD-10-CM | POA: Diagnosis not present

## 2020-02-02 DIAGNOSIS — I129 Hypertensive chronic kidney disease with stage 1 through stage 4 chronic kidney disease, or unspecified chronic kidney disease: Secondary | ICD-10-CM | POA: Insufficient documentation

## 2020-02-02 DIAGNOSIS — K219 Gastro-esophageal reflux disease without esophagitis: Secondary | ICD-10-CM | POA: Insufficient documentation

## 2020-02-02 DIAGNOSIS — Z7982 Long term (current) use of aspirin: Secondary | ICD-10-CM | POA: Diagnosis not present

## 2020-02-02 DIAGNOSIS — Z79899 Other long term (current) drug therapy: Secondary | ICD-10-CM | POA: Diagnosis not present

## 2020-02-02 DIAGNOSIS — D693 Immune thrombocytopenic purpura: Secondary | ICD-10-CM | POA: Diagnosis not present

## 2020-02-02 DIAGNOSIS — Z952 Presence of prosthetic heart valve: Secondary | ICD-10-CM | POA: Diagnosis not present

## 2020-02-02 DIAGNOSIS — E119 Type 2 diabetes mellitus without complications: Secondary | ICD-10-CM | POA: Insufficient documentation

## 2020-02-02 DIAGNOSIS — C3431 Malignant neoplasm of lower lobe, right bronchus or lung: Secondary | ICD-10-CM

## 2020-02-02 DIAGNOSIS — K2940 Chronic atrophic gastritis without bleeding: Secondary | ICD-10-CM | POA: Insufficient documentation

## 2020-02-02 DIAGNOSIS — I10 Essential (primary) hypertension: Secondary | ICD-10-CM | POA: Insufficient documentation

## 2020-02-02 DIAGNOSIS — Z87442 Personal history of urinary calculi: Secondary | ICD-10-CM | POA: Insufficient documentation

## 2020-02-02 DIAGNOSIS — Z8673 Personal history of transient ischemic attack (TIA), and cerebral infarction without residual deficits: Secondary | ICD-10-CM | POA: Diagnosis not present

## 2020-02-02 DIAGNOSIS — N179 Acute kidney failure, unspecified: Secondary | ICD-10-CM | POA: Diagnosis not present

## 2020-02-02 DIAGNOSIS — M129 Arthropathy, unspecified: Secondary | ICD-10-CM | POA: Insufficient documentation

## 2020-02-02 DIAGNOSIS — I2510 Atherosclerotic heart disease of native coronary artery without angina pectoris: Secondary | ICD-10-CM | POA: Insufficient documentation

## 2020-02-02 DIAGNOSIS — N184 Chronic kidney disease, stage 4 (severe): Secondary | ICD-10-CM | POA: Diagnosis not present

## 2020-02-02 DIAGNOSIS — E1122 Type 2 diabetes mellitus with diabetic chronic kidney disease: Secondary | ICD-10-CM | POA: Insufficient documentation

## 2020-02-02 LAB — CBC WITH DIFFERENTIAL/PLATELET
Abs Immature Granulocytes: 0.01 10*3/uL (ref 0.00–0.07)
Basophils Absolute: 0 10*3/uL (ref 0.0–0.1)
Basophils Relative: 1 %
Eosinophils Absolute: 0.1 10*3/uL (ref 0.0–0.5)
Eosinophils Relative: 3 %
HCT: 30.3 % — ABNORMAL LOW (ref 36.0–46.0)
Hemoglobin: 10.1 g/dL — ABNORMAL LOW (ref 12.0–15.0)
Immature Granulocytes: 0 %
Lymphocytes Relative: 22 %
Lymphs Abs: 0.9 10*3/uL (ref 0.7–4.0)
MCH: 31.3 pg (ref 26.0–34.0)
MCHC: 33.3 g/dL (ref 30.0–36.0)
MCV: 93.8 fL (ref 80.0–100.0)
Monocytes Absolute: 0.2 10*3/uL (ref 0.1–1.0)
Monocytes Relative: 6 %
Neutro Abs: 2.8 10*3/uL (ref 1.7–7.7)
Neutrophils Relative %: 68 %
Platelets: 98 10*3/uL — ABNORMAL LOW (ref 150–400)
RBC: 3.23 MIL/uL — ABNORMAL LOW (ref 3.87–5.11)
RDW: 12.9 % (ref 11.5–15.5)
WBC: 4.1 10*3/uL (ref 4.0–10.5)
nRBC: 0 % (ref 0.0–0.2)

## 2020-02-02 LAB — COMPREHENSIVE METABOLIC PANEL
ALT: 12 U/L (ref 0–44)
AST: 19 U/L (ref 15–41)
Albumin: 4.1 g/dL (ref 3.5–5.0)
Alkaline Phosphatase: 53 U/L (ref 38–126)
Anion gap: 9 (ref 5–15)
BUN: 31 mg/dL — ABNORMAL HIGH (ref 8–23)
CO2: 24 mmol/L (ref 22–32)
Calcium: 8.6 mg/dL — ABNORMAL LOW (ref 8.9–10.3)
Chloride: 107 mmol/L (ref 98–111)
Creatinine, Ser: 1.63 mg/dL — ABNORMAL HIGH (ref 0.44–1.00)
GFR, Estimated: 31 mL/min — ABNORMAL LOW (ref >60)
Glucose, Bld: 90 mg/dL (ref 70–99)
Potassium: 4.3 mmol/L (ref 3.5–5.1)
Sodium: 140 mmol/L (ref 135–145)
Total Bilirubin: 0.7 mg/dL (ref 0.3–1.2)
Total Protein: 6.9 g/dL (ref 6.5–8.1)

## 2020-02-02 MED ORDER — INFLUENZA VAC A&B SA ADJ QUAD 0.5 ML IM PRSY
0.5000 mL | PREFILLED_SYRINGE | Freq: Once | INTRAMUSCULAR | Status: AC
  Administered 2020-02-02: 0.5 mL via INTRAMUSCULAR
  Filled 2020-02-02: qty 0.5

## 2020-02-02 MED ORDER — OSIMERTINIB MESYLATE 80 MG PO TABS: 80.0000 mg | ORAL_TABLET | Freq: Every day | ORAL | 6 refills | Status: DC

## 2020-02-02 NOTE — Assessment & Plan Note (Addendum)
#  Stage IV recurrent adenocarcinoma the lung; EGFR mutated. On  osimertinib 80 mg [Feb 6th 2020]. SEP 2021-PET scan shows stable/small right-sided pleural effusion; no hypermetabolic noted; no distant metastatic disease; STABLE.   # currently on Osimertinib 80 mg/day; tolerating well.  # ITP- chronic- platelets ~ 98 on promacta 25 mg/day. STABLE.  #Stomach cancer stage I status post gastrectomy; PET scan-no evidence of recurrence.STABLE  # right sided stroke/ seizures-on asprin; off plavix;; f/u neurology-[place]; STABLE  # CKD- stage IV; Left Hydronephrosis s/p ureteral stent placement/nephrolithiasis-~ GFR 25 today; on K-citrate BID; STABLE.  # DISPOSITION: # Flu shot today # follow up in 1 month;  MD;labs- cbc/cmp;  Dr.B
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
## 2020-02-03 MED FILL — PROMACTA 25 MG TABLET: 25 | 30 days supply | Qty: 30 | Fill #2

## 2020-02-10 ENCOUNTER — Telehealth: Payer: Self-pay | Admitting: *Deleted

## 2020-02-10 DIAGNOSIS — C3431 Malignant neoplasm of lower lobe, right bronchus or lung: Secondary | ICD-10-CM

## 2020-02-10 NOTE — Telephone Encounter (Signed)
Spoke with Seth Bake. [facility] sent referral for medical clearance. Patient needs an epidural steroid injection. The dept faxed this on 02/04/2020

## 2020-02-11 ENCOUNTER — Telehealth: Payer: Self-pay | Admitting: Internal Medicine

## 2020-02-11 NOTE — Addendum Note (Signed)
Addended by: Gloris Ham on: 02/11/2020 10:38 AM   Modules accepted: Orders

## 2020-02-11 NOTE — Telephone Encounter (Signed)
On 11/04-spoke to nurse practitioner/pain clinic-regarding patient's clearance for epidural.  Recommends platelets greater than 100,000.  Patient's platelets 90-100 at this time.  Recommend increasing Promacta 2 pills a day [50 mg a day].  Defer to neurology/physiatry regarding aspirin.  We will repeat labs in 1 week.   I discussed above with the patient.  Patient agreement.  GB

## 2020-02-11 NOTE — Telephone Encounter (Signed)
Dr. Rogue Bussing spoke with Dr. Saralyn Pilar PA. Dr. Rogue Bussing would like patient to take promacta 25 mg twice daily x 1 week. Patient will need labs drawn- cbc in 1 week. MD will decide if patient is cleared for steriod injection after this lab test. Patient contacted and I explained the plan of care. apts given for 11/11 at 2:45 pm. For a cbc. She gave verbal understanding of the plan of care and pt read back medication instructions.

## 2020-02-16 NOTE — Telephone Encounter (Signed)
Patient called and spoke to [place]. She reported that Dr. Sharlet Salina is checking her plt counts on Thursday. She will cnl the lab in the cancer center to avoid duplicate labs. She will have Dr. Sharlet Salina ' office fwd the labs to our office.

## 2020-02-18 ENCOUNTER — Inpatient Hospital Stay: Payer: Medicare PPO

## 2020-02-18 ENCOUNTER — Other Ambulatory Visit (INDEPENDENT_AMBULATORY_CARE_PROVIDER_SITE_OTHER): Payer: Self-pay | Admitting: Nurse Practitioner

## 2020-02-18 DIAGNOSIS — R209 Unspecified disturbances of skin sensation: Secondary | ICD-10-CM

## 2020-02-19 ENCOUNTER — Ambulatory Visit (INDEPENDENT_AMBULATORY_CARE_PROVIDER_SITE_OTHER): Payer: Medicare PPO

## 2020-02-19 ENCOUNTER — Ambulatory Visit (INDEPENDENT_AMBULATORY_CARE_PROVIDER_SITE_OTHER): Payer: Medicare PPO | Admitting: Vascular Surgery

## 2020-02-19 ENCOUNTER — Encounter (INDEPENDENT_AMBULATORY_CARE_PROVIDER_SITE_OTHER): Payer: Medicare Other

## 2020-02-19 ENCOUNTER — Ambulatory Visit (INDEPENDENT_AMBULATORY_CARE_PROVIDER_SITE_OTHER): Payer: Medicare Other | Admitting: Vascular Surgery

## 2020-02-19 ENCOUNTER — Other Ambulatory Visit: Payer: Self-pay

## 2020-02-19 VITALS — BP 130/65 | HR 80 | Ht 61.0 in | Wt 108.0 lb

## 2020-02-19 DIAGNOSIS — I10 Essential (primary) hypertension: Secondary | ICD-10-CM

## 2020-02-19 DIAGNOSIS — I739 Peripheral vascular disease, unspecified: Secondary | ICD-10-CM

## 2020-02-19 DIAGNOSIS — I6523 Occlusion and stenosis of bilateral carotid arteries: Secondary | ICD-10-CM

## 2020-02-19 DIAGNOSIS — R531 Weakness: Secondary | ICD-10-CM

## 2020-02-19 DIAGNOSIS — E1159 Type 2 diabetes mellitus with other circulatory complications: Secondary | ICD-10-CM

## 2020-02-19 DIAGNOSIS — R209 Unspecified disturbances of skin sensation: Secondary | ICD-10-CM

## 2020-02-19 NOTE — Telephone Encounter (Signed)
Apts at [facility] is now scheduled for Monday 11/15. Patient instructed to continue taking promacta twice daily until labs are rechecked on Monday.

## 2020-02-19 NOTE — Assessment & Plan Note (Signed)
Carotid duplex today reveals stable 1 to 39% right ICA stenosis and stable 40 to 59% left ICA stenosis without significant progression from previous studies.  No focal neurologic symptoms.  Continue current medical regimen.  Recheck in 1 year.

## 2020-02-19 NOTE — Assessment & Plan Note (Signed)
Her ABIs today are 1.3 on the right and 1.2 on the left which may be slightly increased from medial calcification, but her digital pressures and waveforms remain good.  Not currently having any symptoms.  Can check in 1 year.

## 2020-02-19 NOTE — Assessment & Plan Note (Signed)
blood glucose control important in reducing the progression of atherosclerotic disease. Also, involved in wound healing. On appropriate medications.  
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
## 2020-02-22 NOTE — Telephone Encounter (Signed)
Specimen:  Blood  Ref Range & Units Today Comments  Platelet Count 150 - 450 103/uL 121 Low   Results verified by repeat testing    Dr. Rogue Bussing these are the lab results from [facility]. Please advise regarding patient's Promatca dosing and when to recheck her labs.

## 2020-02-23 ENCOUNTER — Telehealth: Payer: Self-pay

## 2020-02-23 ENCOUNTER — Telehealth: Payer: Self-pay | Admitting: Internal Medicine

## 2020-02-23 ENCOUNTER — Other Ambulatory Visit: Payer: Self-pay

## 2020-02-23 DIAGNOSIS — C3431 Malignant neoplasm of lower lobe, right bronchus or lung: Secondary | ICD-10-CM

## 2020-02-23 MED ORDER — OSIMERTINIB MESYLATE 80 MG PO TABS: 80.0000 mg | ORAL_TABLET | Freq: Every day | ORAL | 6 refills | Status: DC

## 2020-02-23 MED ORDER — ELTROMBOPAG OLAMINE 25 MG PO TABS: ORAL_TABLET | ORAL | 6 refills | Status: DC

## 2020-02-23 NOTE — Telephone Encounter (Signed)
Per Dr. Harl Bowie inform pt- thta she is ok to proceed with steroid injections as long as the platelets are above 100. platlets- 121. keep taking promatca 2 pills a day for now.   ---Patient aware of response from Dr. B above. Pt stated that she already had the steroid injection. Aware to continue taking promacta 2 pills a day.

## 2020-02-23 NOTE — Telephone Encounter (Signed)
Error

## 2020-02-23 NOTE — Telephone Encounter (Signed)
On 11/16-spoke to patient regarding results labs of platelets 121.  Patient s/p epidural injection.  Recommend taking Promacta once a day.

## 2020-02-23 NOTE — Telephone Encounter (Signed)
Spoke with patient. Patient instructed to continue taking her Promacta 2 pills daily. Will recheck her labs in 1 week as scheduled. Patient stated that she already had one steroid injection yesterday.

## 2020-02-23 NOTE — Telephone Encounter (Signed)
Pt called needing a refill on promacta and tagrisso. Please advise.

## 2020-02-23 NOTE — Telephone Encounter (Signed)
please inform pt- thta she is ok to proceed with steroid injections as long as the platelets are above 100. platlets- 121. keep taking promatca 2 pills a day for now

## 2020-03-01 ENCOUNTER — Inpatient Hospital Stay: Payer: Medicare PPO | Admitting: Internal Medicine

## 2020-03-01 ENCOUNTER — Telehealth: Payer: Self-pay

## 2020-03-01 ENCOUNTER — Inpatient Hospital Stay: Payer: Medicare PPO | Attending: Internal Medicine

## 2020-03-01 ENCOUNTER — Other Ambulatory Visit: Payer: Self-pay

## 2020-03-01 ENCOUNTER — Encounter: Payer: Self-pay | Admitting: Internal Medicine

## 2020-03-01 DIAGNOSIS — Z7982 Long term (current) use of aspirin: Secondary | ICD-10-CM | POA: Insufficient documentation

## 2020-03-01 DIAGNOSIS — C160 Malignant neoplasm of cardia: Secondary | ICD-10-CM | POA: Diagnosis not present

## 2020-03-01 DIAGNOSIS — E1122 Type 2 diabetes mellitus with diabetic chronic kidney disease: Secondary | ICD-10-CM | POA: Insufficient documentation

## 2020-03-01 DIAGNOSIS — Z79899 Other long term (current) drug therapy: Secondary | ICD-10-CM | POA: Insufficient documentation

## 2020-03-01 DIAGNOSIS — D693 Immune thrombocytopenic purpura: Secondary | ICD-10-CM | POA: Diagnosis not present

## 2020-03-01 DIAGNOSIS — E785 Hyperlipidemia, unspecified: Secondary | ICD-10-CM | POA: Insufficient documentation

## 2020-03-01 DIAGNOSIS — N184 Chronic kidney disease, stage 4 (severe): Secondary | ICD-10-CM | POA: Insufficient documentation

## 2020-03-01 DIAGNOSIS — N179 Acute kidney failure, unspecified: Secondary | ICD-10-CM | POA: Insufficient documentation

## 2020-03-01 DIAGNOSIS — J90 Pleural effusion, not elsewhere classified: Secondary | ICD-10-CM | POA: Diagnosis not present

## 2020-03-01 DIAGNOSIS — K2940 Chronic atrophic gastritis without bleeding: Secondary | ICD-10-CM | POA: Insufficient documentation

## 2020-03-01 DIAGNOSIS — I2510 Atherosclerotic heart disease of native coronary artery without angina pectoris: Secondary | ICD-10-CM | POA: Insufficient documentation

## 2020-03-01 DIAGNOSIS — Z85828 Personal history of other malignant neoplasm of skin: Secondary | ICD-10-CM | POA: Diagnosis not present

## 2020-03-01 DIAGNOSIS — M129 Arthropathy, unspecified: Secondary | ICD-10-CM | POA: Insufficient documentation

## 2020-03-01 DIAGNOSIS — Z8673 Personal history of transient ischemic attack (TIA), and cerebral infarction without residual deficits: Secondary | ICD-10-CM | POA: Diagnosis not present

## 2020-03-01 DIAGNOSIS — M810 Age-related osteoporosis without current pathological fracture: Secondary | ICD-10-CM | POA: Insufficient documentation

## 2020-03-01 DIAGNOSIS — C3431 Malignant neoplasm of lower lobe, right bronchus or lung: Secondary | ICD-10-CM | POA: Insufficient documentation

## 2020-03-01 DIAGNOSIS — I129 Hypertensive chronic kidney disease with stage 1 through stage 4 chronic kidney disease, or unspecified chronic kidney disease: Secondary | ICD-10-CM | POA: Insufficient documentation

## 2020-03-01 DIAGNOSIS — R531 Weakness: Secondary | ICD-10-CM | POA: Diagnosis not present

## 2020-03-01 DIAGNOSIS — I10 Essential (primary) hypertension: Secondary | ICD-10-CM | POA: Insufficient documentation

## 2020-03-01 DIAGNOSIS — K219 Gastro-esophageal reflux disease without esophagitis: Secondary | ICD-10-CM | POA: Diagnosis not present

## 2020-03-01 DIAGNOSIS — Z952 Presence of prosthetic heart valve: Secondary | ICD-10-CM | POA: Insufficient documentation

## 2020-03-01 DIAGNOSIS — R569 Unspecified convulsions: Secondary | ICD-10-CM | POA: Insufficient documentation

## 2020-03-01 LAB — CBC WITH DIFFERENTIAL/PLATELET
Abs Immature Granulocytes: 0.01 10*3/uL (ref 0.00–0.07)
Basophils Absolute: 0 10*3/uL (ref 0.0–0.1)
Basophils Relative: 1 %
Eosinophils Absolute: 0.1 10*3/uL (ref 0.0–0.5)
Eosinophils Relative: 2 %
HCT: 30.5 % — ABNORMAL LOW (ref 36.0–46.0)
Hemoglobin: 10 g/dL — ABNORMAL LOW (ref 12.0–15.0)
Immature Granulocytes: 0 %
Lymphocytes Relative: 19 %
Lymphs Abs: 0.8 10*3/uL (ref 0.7–4.0)
MCH: 31.2 pg (ref 26.0–34.0)
MCHC: 32.8 g/dL (ref 30.0–36.0)
MCV: 95 fL (ref 80.0–100.0)
Monocytes Absolute: 0.3 10*3/uL (ref 0.1–1.0)
Monocytes Relative: 8 %
Neutro Abs: 2.8 10*3/uL (ref 1.7–7.7)
Neutrophils Relative %: 70 %
Platelets: 127 10*3/uL — ABNORMAL LOW (ref 150–400)
RBC: 3.21 MIL/uL — ABNORMAL LOW (ref 3.87–5.11)
RDW: 12.7 % (ref 11.5–15.5)
WBC: 4 10*3/uL (ref 4.0–10.5)
nRBC: 0 % (ref 0.0–0.2)

## 2020-03-01 LAB — COMPREHENSIVE METABOLIC PANEL
ALT: 9 U/L (ref 0–44)
AST: 16 U/L (ref 15–41)
Albumin: 3.9 g/dL (ref 3.5–5.0)
Alkaline Phosphatase: 51 U/L (ref 38–126)
Anion gap: 9 (ref 5–15)
BUN: 31 mg/dL — ABNORMAL HIGH (ref 8–23)
CO2: 25 mmol/L (ref 22–32)
Calcium: 8.7 mg/dL — ABNORMAL LOW (ref 8.9–10.3)
Chloride: 104 mmol/L (ref 98–111)
Creatinine, Ser: 1.75 mg/dL — ABNORMAL HIGH (ref 0.44–1.00)
GFR, Estimated: 29 mL/min — ABNORMAL LOW (ref >60)
Glucose, Bld: 139 mg/dL — ABNORMAL HIGH (ref 70–99)
Potassium: 4.3 mmol/L (ref 3.5–5.1)
Sodium: 138 mmol/L (ref 135–145)
Total Bilirubin: 0.4 mg/dL (ref 0.3–1.2)
Total Protein: 6.5 g/dL (ref 6.5–8.1)

## 2020-03-01 MED ORDER — POTASSIUM CITRATE ER 10 MEQ (1080 MG) PO TBCR
10.0000 meq | EXTENDED_RELEASE_TABLET | Freq: Two times a day (BID) | ORAL | 2 refills | Status: DC
Start: 2020-03-01 — End: 2020-09-19
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
## 2020-03-01 NOTE — Assessment & Plan Note (Addendum)
#  Stage IV recurrent adenocarcinoma the lung; EGFR mutated. On  osimertinib 80 mg [Feb 6th 2020]. SEP23rd 2021-PET scan shows stable/small right-sided pleural effusion; no hypermetabolic noted; no distant metastatic disease; STABLE.  # currently on Osimertinib 80 mg/day; tolerating well. Order PET at next visit.   # ITP- chronic- platelets ~ 124 on promacta 25 mg/day. STABLE. Continue once a day.   #Stomach cancer stage I status post gastrectomy; PET scan-no evidence of recurrence.; STABLE.   # right sided stroke/ seizures-on asprin; off plavix;; f/u neurology-[place];STABLE  # CKD- stage IV; Left Hydronephrosis s/p ureteral stent placement/nephrolithiasis-~ GFR 25 today; on K-citrate BID; STABLE.  * [facility] for meds.  # DISPOSITION:  # follow up in 2 month;  MD;labs- cbc/cmp;  Dr.B

## 2020-03-01 NOTE — Telephone Encounter (Signed)
Error

## 2020-03-11 MED FILL — PROMACTA 25 MG TABLET: 25 | 30 days supply | Qty: 30 | Fill #3

## 2020-03-17 ENCOUNTER — Ambulatory Visit (INDEPENDENT_AMBULATORY_CARE_PROVIDER_SITE_OTHER): Payer: Medicare PPO

## 2020-03-17 DIAGNOSIS — I459 Conduction disorder, unspecified: Secondary | ICD-10-CM

## 2020-03-18 LAB — CUP PACEART REMOTE DEVICE CHECK
Battery Remaining Longevity: 94 mo
Battery Voltage: 3.01 V
Brady Statistic AS VP Percent: 66.02 %
Brady Statistic AS VS Percent: 0 %
Brady Statistic RV Percent Paced: 99.95 %
Date Time Interrogation Session: 20211209140600
Implantable Pulse Generator Implant Date: 20200901
Lead Channel Impedance Value: 490 Ohm
Lead Channel Pacing Threshold Amplitude: 0.625 V
Lead Channel Pacing Threshold Pulse Width: 0.24 ms
Lead Channel Sensing Intrinsic Amplitude: 8.213 mV
Lead Channel Setting Pacing Amplitude: 1.125
Lead Channel Setting Pacing Pulse Width: 0.24 ms
Lead Channel Setting Sensing Sensitivity: 2.8 mV

## 2020-03-30 NOTE — Progress Notes (Signed)
Remote pacemaker transmission.   

## 2020-03-31 ENCOUNTER — Other Ambulatory Visit: Payer: Self-pay

## 2020-03-31 ENCOUNTER — Encounter: Payer: Self-pay | Admitting: Internal Medicine

## 2020-03-31 ENCOUNTER — Ambulatory Visit: Payer: Medicare PPO | Admitting: Internal Medicine

## 2020-03-31 VITALS — BP 148/72 | HR 107 | Ht 61.0 in | Wt 109.6 lb

## 2020-03-31 DIAGNOSIS — I459 Conduction disorder, unspecified: Secondary | ICD-10-CM

## 2020-03-31 DIAGNOSIS — Z950 Presence of cardiac pacemaker: Secondary | ICD-10-CM | POA: Diagnosis not present
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
## 2020-03-31 NOTE — Patient Instructions (Addendum)
Medication Instructions:  Your physician recommends that you continue on your current medications as directed. Please refer to the Current Medication list given to you today.  Labwork: None ordered.  Testing/Procedures: None ordered.  Follow-Up: Your physician wants you to follow-up in: one year with Oda Kilts, PA.   You will receive a reminder letter in the mail two months in advance. If you don't receive a letter, please call our office to schedule the follow-up appointment.  Remote monitoring is used to monitor your Pacemaker from home. This monitoring reduces the number of office visits required to check your device to one time per year. It allows [place] to keep an eye on the functioning of your device to ensure it is working properly. You are scheduled for a device check from home on 06/16/2020. You may send your transmission at any time that day. If you have a wireless device, the transmission will be sent automatically. After your physician reviews your transmission, you will receive a postcard with your next transmission date.  Any Other Special Instructions Will Be Listed Below (If Applicable).  If you need a refill on your cardiac medications before your next appointment, please call your pharmacy.

## 2020-04-07 ENCOUNTER — Telehealth: Payer: Self-pay | Admitting: Pharmacy Technician

## 2020-04-07 NOTE — Telephone Encounter (Signed)
Oral Oncology Patient Advocate Encounter  Prior Authorization for Stacy Cook has been approved.    PA# [id number] Effective dates: 04/07/20 through 10/04/20  Oral Oncology Clinic will continue to follow.   [place] Patient [place] Phone [phone] Fax [phone] 04/07/2020 10:43 AM

## 2020-04-07 NOTE — Telephone Encounter (Signed)
Oral Oncology Patient Advocate Encounter  Received notification from [facility] that the existing prior authorization for Tagrisso is due for renewal.  Renewal PA submitted on CoverMyMeds Key [id number] Status is pending  Oral Oncology Clinic will continue to follow.  [place] Patient [place] Phone [phone] Fax [phone] 04/07/2020 10:30 AM

## 2020-04-14 MED FILL — PROMACTA 25 MG TABLET: 25 | 30 days supply | Qty: 30 | Fill #4

## 2020-05-03 ENCOUNTER — Inpatient Hospital Stay: Payer: Medicare PPO | Attending: Internal Medicine

## 2020-05-03 ENCOUNTER — Other Ambulatory Visit: Payer: Self-pay

## 2020-05-03 ENCOUNTER — Inpatient Hospital Stay: Payer: Medicare PPO | Admitting: Internal Medicine

## 2020-05-03 DIAGNOSIS — C3431 Malignant neoplasm of lower lobe, right bronchus or lung: Secondary | ICD-10-CM | POA: Insufficient documentation

## 2020-05-03 LAB — CBC WITH DIFFERENTIAL/PLATELET
Abs Immature Granulocytes: 0.01 10*3/uL (ref 0.00–0.07)
Basophils Absolute: 0 10*3/uL (ref 0.0–0.1)
Basophils Relative: 1 %
Eosinophils Absolute: 0.1 10*3/uL (ref 0.0–0.5)
Eosinophils Relative: 4 %
HCT: 31.1 % — ABNORMAL LOW (ref 36.0–46.0)
Hemoglobin: 10.5 g/dL — ABNORMAL LOW (ref 12.0–15.0)
Immature Granulocytes: 0 %
Lymphocytes Relative: 31 %
Lymphs Abs: 1.1 10*3/uL (ref 0.7–4.0)
MCH: 30.7 pg (ref 26.0–34.0)
MCHC: 33.8 g/dL (ref 30.0–36.0)
MCV: 90.9 fL (ref 80.0–100.0)
Monocytes Absolute: 0.2 10*3/uL (ref 0.1–1.0)
Monocytes Relative: 7 %
Neutro Abs: 2.1 10*3/uL (ref 1.7–7.7)
Neutrophils Relative %: 57 %
Platelets: 123 10*3/uL — ABNORMAL LOW (ref 150–400)
RBC: 3.42 MIL/uL — ABNORMAL LOW (ref 3.87–5.11)
RDW: 12.7 % (ref 11.5–15.5)
WBC: 3.6 10*3/uL — ABNORMAL LOW (ref 4.0–10.5)
nRBC: 0 % (ref 0.0–0.2)

## 2020-05-03 LAB — COMPREHENSIVE METABOLIC PANEL
ALT: 14 U/L (ref 0–44)
AST: 20 U/L (ref 15–41)
Albumin: 4.2 g/dL (ref 3.5–5.0)
Alkaline Phosphatase: 61 U/L (ref 38–126)
Anion gap: 11 (ref 5–15)
BUN: 41 mg/dL — ABNORMAL HIGH (ref 8–23)
CO2: 25 mmol/L (ref 22–32)
Calcium: 9 mg/dL (ref 8.9–10.3)
Chloride: 102 mmol/L (ref 98–111)
Creatinine, Ser: 1.91 mg/dL — ABNORMAL HIGH (ref 0.44–1.00)
GFR, Estimated: 26 mL/min — ABNORMAL LOW (ref >60)
Glucose, Bld: 94 mg/dL (ref 70–99)
Potassium: 4.8 mmol/L (ref 3.5–5.1)
Sodium: 138 mmol/L (ref 135–145)
Total Bilirubin: 0.4 mg/dL (ref 0.3–1.2)
Total Protein: 7 g/dL (ref 6.5–8.1)

## 2020-05-03 MED ORDER — OSIMERTINIB MESYLATE 80 MG PO TABS: 80.0000 mg | ORAL_TABLET | Freq: Every day | ORAL | 6 refills | Status: DC

## 2020-05-03 MED ORDER — ELTROMBOPAG OLAMINE 25 MG PO TABS: ORAL_TABLET | ORAL | 6 refills | Status: DC
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
## 2020-05-03 NOTE — Assessment & Plan Note (Signed)
#  Stage IV recurrent adenocarcinoma the lung; EGFR mutated. On  osimertinib 80 mg [Feb 6th 2020]. SEP23rd 2021-PET scan shows stable/small right-sided pleural effusion; no hypermetabolic noted; no distant metastatic disease-STABLE.   # currently on Osimertinib 80 mg/day; tolerating well. Will order PET scan.    # ITP- chronic- platelets ~ 123 on promacta 25 mg/day.; STABLE.    #Stomach cancer stage I status post gastrectomy; PET scan-no evidence of recurrence-STABLE.    # right sided stroke/ seizures-on asprin; off plavix;; f/u neurology-[place]-STABLE.   # CKD- stage IV; Left Hydronephrosis s/p ureteral stent placement/nephrolithiasis-~ GFR 29 today; on K-citrate BID; STABLE.   # DISPOSITION: # follow up in 2 month;  MD;labs- cbc/cmp;PET prior  Dr.B      

## 2020-05-04 MED FILL — PROMACTA 25 MG TABLET: 25 | 30 days supply | Qty: 60 | Fill #0

## 2020-05-30 ENCOUNTER — Other Ambulatory Visit: Payer: Self-pay | Admitting: Internal Medicine

## 2020-05-30 DIAGNOSIS — C3431 Malignant neoplasm of lower lobe, right bronchus or lung: Secondary | ICD-10-CM

## 2020-06-02 ENCOUNTER — Telehealth: Payer: Self-pay | Admitting: *Deleted

## 2020-06-02 NOTE — Telephone Encounter (Signed)
Patient received an incoming alert that medication was filled and she inquired which medication. I explained to her that new updates on mychart allows for notification when new prescriptions are sent to the pharmacy. Her promacta was the drug that was RF. I explained that this helps keep the patient informed when the doctor prescribes new medications. She thanked me for clarifying and assisting her today.

## 2020-06-06 MED FILL — PROMACTA 25 MG TABLET: 25 | 30 days supply | Qty: 30 | Fill #0

## 2020-06-16 ENCOUNTER — Ambulatory Visit (INDEPENDENT_AMBULATORY_CARE_PROVIDER_SITE_OTHER): Payer: Medicare PPO

## 2020-06-16 DIAGNOSIS — I459 Conduction disorder, unspecified: Secondary | ICD-10-CM

## 2020-06-19 LAB — CUP PACEART REMOTE DEVICE CHECK
Battery Remaining Longevity: 96 mo
Battery Voltage: 3 V
Brady Statistic AS VP Percent: 66.42 %
Brady Statistic AS VS Percent: 0 %
Brady Statistic RV Percent Paced: 99.95 %
Date Time Interrogation Session: 20220310075500
Implantable Pulse Generator Implant Date: 20200901
Lead Channel Impedance Value: 530 Ohm
Lead Channel Pacing Threshold Amplitude: 0.5 V
Lead Channel Pacing Threshold Pulse Width: 0.24 ms
Lead Channel Sensing Intrinsic Amplitude: 28.575 mV
Lead Channel Setting Pacing Amplitude: 1 V
Lead Channel Setting Pacing Pulse Width: 0.24 ms
Lead Channel Setting Sensing Sensitivity: 2.8 mV

## 2020-06-23 ENCOUNTER — Ambulatory Visit
Admission: RE | Admit: 2020-06-23 | Discharge: 2020-06-23 | Disposition: A | Discharge status: Home / Self Care | Payer: Medicare PPO | Source: Ambulatory Visit | Attending: Internal Medicine | Admitting: Internal Medicine

## 2020-06-23 ENCOUNTER — Other Ambulatory Visit: Payer: Self-pay

## 2020-06-23 DIAGNOSIS — J90 Pleural effusion, not elsewhere classified: Secondary | ICD-10-CM | POA: Insufficient documentation

## 2020-06-23 DIAGNOSIS — I700 Atherosclerosis of aorta: Secondary | ICD-10-CM | POA: Insufficient documentation

## 2020-06-23 DIAGNOSIS — C3431 Malignant neoplasm of lower lobe, right bronchus or lung: Secondary | ICD-10-CM | POA: Diagnosis not present

## 2020-06-23 DIAGNOSIS — I2510 Atherosclerotic heart disease of native coronary artery without angina pectoris: Secondary | ICD-10-CM | POA: Insufficient documentation

## 2020-06-23 LAB — GLUCOSE, CAPILLARY: Glucose-Capillary: 84 mg/dL (ref 70–99)

## 2020-06-23 MED ORDER — FLUDEOXYGLUCOSE F - 18 (FDG) INJECTION
5.5000 | Freq: Once | INTRAVENOUS | Status: AC | PRN
  Administered 2020-06-23: 5.899 via INTRAVENOUS

## 2020-06-24 NOTE — Progress Notes (Signed)
Remote pacemaker transmission.   

## 2020-06-28 ENCOUNTER — Encounter: Payer: Self-pay | Admitting: Internal Medicine

## 2020-06-28 ENCOUNTER — Inpatient Hospital Stay: Payer: Medicare PPO | Admitting: Internal Medicine

## 2020-06-28 ENCOUNTER — Inpatient Hospital Stay: Payer: Medicare PPO | Attending: Internal Medicine

## 2020-06-28 VITALS — BP 162/62 | HR 85 | Temp 97.1°F | Resp 16 | Ht 61.0 in | Wt 110.4 lb

## 2020-06-28 DIAGNOSIS — R569 Unspecified convulsions: Secondary | ICD-10-CM | POA: Diagnosis not present

## 2020-06-28 DIAGNOSIS — M5136 Other intervertebral disc degeneration, lumbar region: Secondary | ICD-10-CM | POA: Insufficient documentation

## 2020-06-28 DIAGNOSIS — E538 Deficiency of other specified B group vitamins: Secondary | ICD-10-CM | POA: Insufficient documentation

## 2020-06-28 DIAGNOSIS — I129 Hypertensive chronic kidney disease with stage 1 through stage 4 chronic kidney disease, or unspecified chronic kidney disease: Secondary | ICD-10-CM | POA: Insufficient documentation

## 2020-06-28 DIAGNOSIS — E1122 Type 2 diabetes mellitus with diabetic chronic kidney disease: Secondary | ICD-10-CM | POA: Diagnosis not present

## 2020-06-28 DIAGNOSIS — Z79899 Other long term (current) drug therapy: Secondary | ICD-10-CM | POA: Insufficient documentation

## 2020-06-28 DIAGNOSIS — R5383 Other fatigue: Secondary | ICD-10-CM | POA: Insufficient documentation

## 2020-06-28 DIAGNOSIS — D693 Immune thrombocytopenic purpura: Secondary | ICD-10-CM | POA: Diagnosis not present

## 2020-06-28 DIAGNOSIS — Z7982 Long term (current) use of aspirin: Secondary | ICD-10-CM | POA: Insufficient documentation

## 2020-06-28 DIAGNOSIS — I10 Essential (primary) hypertension: Secondary | ICD-10-CM | POA: Insufficient documentation

## 2020-06-28 DIAGNOSIS — R531 Weakness: Secondary | ICD-10-CM | POA: Diagnosis not present

## 2020-06-28 DIAGNOSIS — Z85028 Personal history of other malignant neoplasm of stomach: Secondary | ICD-10-CM | POA: Insufficient documentation

## 2020-06-28 DIAGNOSIS — Z7902 Long term (current) use of antithrombotics/antiplatelets: Secondary | ICD-10-CM | POA: Diagnosis not present

## 2020-06-28 DIAGNOSIS — K219 Gastro-esophageal reflux disease without esophagitis: Secondary | ICD-10-CM | POA: Insufficient documentation

## 2020-06-28 DIAGNOSIS — N184 Chronic kidney disease, stage 4 (severe): Secondary | ICD-10-CM | POA: Insufficient documentation

## 2020-06-28 DIAGNOSIS — K2940 Chronic atrophic gastritis without bleeding: Secondary | ICD-10-CM | POA: Diagnosis not present

## 2020-06-28 DIAGNOSIS — Z87442 Personal history of urinary calculi: Secondary | ICD-10-CM | POA: Diagnosis not present

## 2020-06-28 DIAGNOSIS — I2510 Atherosclerotic heart disease of native coronary artery without angina pectoris: Secondary | ICD-10-CM | POA: Diagnosis not present

## 2020-06-28 DIAGNOSIS — C3431 Malignant neoplasm of lower lobe, right bronchus or lung: Secondary | ICD-10-CM | POA: Diagnosis present

## 2020-06-28 DIAGNOSIS — R948 Abnormal results of function studies of other organs and systems: Secondary | ICD-10-CM

## 2020-06-28 DIAGNOSIS — N1330 Unspecified hydronephrosis: Secondary | ICD-10-CM | POA: Diagnosis not present

## 2020-06-28 DIAGNOSIS — M810 Age-related osteoporosis without current pathological fracture: Secondary | ICD-10-CM | POA: Diagnosis not present

## 2020-06-28 DIAGNOSIS — E785 Hyperlipidemia, unspecified: Secondary | ICD-10-CM | POA: Insufficient documentation

## 2020-06-28 LAB — COMPREHENSIVE METABOLIC PANEL
ALT: 10 U/L (ref 0–44)
AST: 19 U/L (ref 15–41)
Albumin: 4.1 g/dL (ref 3.5–5.0)
Alkaline Phosphatase: 68 U/L (ref 38–126)
Anion gap: 10 (ref 5–15)
BUN: 32 mg/dL — ABNORMAL HIGH (ref 8–23)
CO2: 24 mmol/L (ref 22–32)
Calcium: 9 mg/dL (ref 8.9–10.3)
Chloride: 101 mmol/L (ref 98–111)
Creatinine, Ser: 1.73 mg/dL — ABNORMAL HIGH (ref 0.44–1.00)
GFR, Estimated: 29 mL/min — ABNORMAL LOW (ref >60)
Glucose, Bld: 88 mg/dL (ref 70–99)
Potassium: 4.7 mmol/L (ref 3.5–5.1)
Sodium: 135 mmol/L (ref 135–145)
Total Bilirubin: 0.6 mg/dL (ref 0.3–1.2)
Total Protein: 7 g/dL (ref 6.5–8.1)

## 2020-06-28 LAB — CBC WITH DIFFERENTIAL/PLATELET
Abs Immature Granulocytes: 0.02 10*3/uL (ref 0.00–0.07)
Basophils Absolute: 0 10*3/uL (ref 0.0–0.1)
Basophils Relative: 1 %
Eosinophils Absolute: 0.3 10*3/uL (ref 0.0–0.5)
Eosinophils Relative: 8 %
HCT: 31.6 % — ABNORMAL LOW (ref 36.0–46.0)
Hemoglobin: 10.4 g/dL — ABNORMAL LOW (ref 12.0–15.0)
Immature Granulocytes: 1 %
Lymphocytes Relative: 26 %
Lymphs Abs: 1.1 10*3/uL (ref 0.7–4.0)
MCH: 31.3 pg (ref 26.0–34.0)
MCHC: 32.9 g/dL (ref 30.0–36.0)
MCV: 95.2 fL (ref 80.0–100.0)
Monocytes Absolute: 0.3 10*3/uL (ref 0.1–1.0)
Monocytes Relative: 8 %
Neutro Abs: 2.4 10*3/uL (ref 1.7–7.7)
Neutrophils Relative %: 56 %
Platelets: 125 10*3/uL — ABNORMAL LOW (ref 150–400)
RBC: 3.32 MIL/uL — ABNORMAL LOW (ref 3.87–5.11)
RDW: 13.2 % (ref 11.5–15.5)
WBC: 4.2 10*3/uL (ref 4.0–10.5)
nRBC: 0 % (ref 0.0–0.2)

## 2020-06-28 NOTE — Assessment & Plan Note (Addendum)
#  Stage IV recurrent adenocarcinoma the lung; EGFR mutated. On  osimertinib 80 mg [Feb 6th 2020].[place] [id number]- No evidence of FDG avid local tumor recurrence or metastatic disease, status post right lower lobectomy; postsurgical changes suggestive gastrectomy; however mild uptake with SUV 4.7; without any soft tissue changes noted [see below]  # currently on Osimertinib 80 mg/day; tolerating well.   # ITP- chronic- platelets ~ 125 on promacta 25 mg/day-stable  #Stomach cancer stage I status post gastrectomy; PET scan-mild uptake at the cosmetic site without any soft tissue lesion.  Will refer to [facility] for further work-up/recommendations.    # right sided stroke/ seizures-on asprin; off plavix;; f/u neurology-[place]-STABLE.   # CKD- stage IV; Left Hydronephrosis s/p ureteral stent placement/nephrolithiasis-~ GFR 29 today; on K-citrate BID; stable  # DISPOSITION: # refer to [facility] re: hx of stomach cancer re: abnormal PET scan/? EGD # follow up in 2 month;  MD;labs- cbc/cmp; Dr.B  # I reviewed the blood work- with the patient in detail; also reviewed the imaging independently [as summarized above]; and with the patient in detail.
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
## 2020-06-30 ENCOUNTER — Telehealth: Payer: Self-pay | Admitting: Pharmacy Technician

## 2020-06-30 NOTE — Telephone Encounter (Signed)
Oral Oncology Patient Advocate Encounter   Was successful in securing patient an $85 grant from Patient [facility]) to provide copayment coverage for Promacta.  This will keep the out of pocket expense at $0.     I have spoken with the patient.    The billing information is as follows and has been shared with [place].   Member ID: [id number] Group ID: [id number] RxBin: [id number] Dates of Eligibility: 03/29/20 through 06/29/21  Fund:  Immune Thrombocytopenic [place] (ITP)  [place] Patient [place] Phone [phone] Fax [phone] 06/30/2020 2:33 PM

## 2020-07-05 ENCOUNTER — Telehealth: Payer: Self-pay | Admitting: Pharmacy Technician

## 2020-07-06 MED FILL — PROMACTA 25 MG TABLET: 25 | 30 days supply | Qty: 30 | Fill #1

## 2020-07-06 NOTE — Telephone Encounter (Signed)
Oral Oncology Patient Advocate Encounter  Received message from Stacy Cook that Stacy Cook prior auth had expired. Submitted a request through CoverMyMeds on 3/29 but never received questions to complete reauthorization.  Called [employer] this morning to complete authorization over Stacy phone.   Authorization pending.  Stacy Cook  Stacy Cook Patient [place] Phone [phone] Fax [phone] 07/06/2020 9:03 AM

## 2020-07-07 ENCOUNTER — Other Ambulatory Visit (HOSPITAL_COMMUNITY): Payer: Self-pay

## 2020-07-07 NOTE — Telephone Encounter (Signed)
Oral Oncology Patient Advocate Encounter  Prior Authorization for Stacy Cook has been approved.    Effective dates: 07/06/20 through 01/06/21  Patients co-pay is $57.95. Patient has a grant that will reduce her copay to $0.  Oral Oncology Clinic will continue to follow.   [place] Patient [place] Phone [phone] Fax [phone] 07/07/2020 11:29 AM

## 2020-08-01 ENCOUNTER — Other Ambulatory Visit (HOSPITAL_COMMUNITY): Payer: Self-pay

## 2020-08-01 MED FILL — Eltrombopag Olamine Tab 25 MG (Base Equiv): ORAL | 30 days supply | Qty: 30 | Fill #0 | Status: AC

## 2020-08-03 ENCOUNTER — Other Ambulatory Visit (HOSPITAL_COMMUNITY): Payer: Self-pay

## 2020-08-30 ENCOUNTER — Inpatient Hospital Stay: Payer: Medicare PPO | Attending: Internal Medicine

## 2020-08-30 ENCOUNTER — Other Ambulatory Visit (HOSPITAL_COMMUNITY): Payer: Self-pay

## 2020-08-30 ENCOUNTER — Inpatient Hospital Stay: Payer: Medicare PPO | Admitting: Internal Medicine

## 2020-08-30 ENCOUNTER — Encounter: Payer: Self-pay | Admitting: Internal Medicine

## 2020-08-30 DIAGNOSIS — C3431 Malignant neoplasm of lower lobe, right bronchus or lung: Secondary | ICD-10-CM

## 2020-08-30 DIAGNOSIS — I129 Hypertensive chronic kidney disease with stage 1 through stage 4 chronic kidney disease, or unspecified chronic kidney disease: Secondary | ICD-10-CM | POA: Insufficient documentation

## 2020-08-30 DIAGNOSIS — N184 Chronic kidney disease, stage 4 (severe): Secondary | ICD-10-CM | POA: Insufficient documentation

## 2020-08-30 DIAGNOSIS — Z79899 Other long term (current) drug therapy: Secondary | ICD-10-CM | POA: Diagnosis not present

## 2020-08-30 DIAGNOSIS — C160 Malignant neoplasm of cardia: Secondary | ICD-10-CM | POA: Diagnosis not present

## 2020-08-30 DIAGNOSIS — Z7982 Long term (current) use of aspirin: Secondary | ICD-10-CM | POA: Insufficient documentation

## 2020-08-30 DIAGNOSIS — Z8673 Personal history of transient ischemic attack (TIA), and cerebral infarction without residual deficits: Secondary | ICD-10-CM | POA: Diagnosis not present

## 2020-08-30 DIAGNOSIS — M5136 Other intervertebral disc degeneration, lumbar region: Secondary | ICD-10-CM | POA: Insufficient documentation

## 2020-08-30 DIAGNOSIS — D693 Immune thrombocytopenic purpura: Secondary | ICD-10-CM | POA: Insufficient documentation

## 2020-08-30 LAB — CBC WITH DIFFERENTIAL/PLATELET
Abs Immature Granulocytes: 0.01 10*3/uL (ref 0.00–0.07)
Basophils Absolute: 0 10*3/uL (ref 0.0–0.1)
Basophils Relative: 1 %
Eosinophils Absolute: 0.2 10*3/uL (ref 0.0–0.5)
Eosinophils Relative: 5 %
HCT: 35.9 % — ABNORMAL LOW (ref 36.0–46.0)
Hemoglobin: 12 g/dL (ref 12.0–15.0)
Immature Granulocytes: 0 %
Lymphocytes Relative: 26 %
Lymphs Abs: 1.2 10*3/uL (ref 0.7–4.0)
MCH: 31.3 pg (ref 26.0–34.0)
MCHC: 33.4 g/dL (ref 30.0–36.0)
MCV: 93.5 fL (ref 80.0–100.0)
Monocytes Absolute: 0.4 10*3/uL (ref 0.1–1.0)
Monocytes Relative: 8 %
Neutro Abs: 2.9 10*3/uL (ref 1.7–7.7)
Neutrophils Relative %: 60 %
Platelets: 123 10*3/uL — ABNORMAL LOW (ref 150–400)
RBC: 3.84 MIL/uL — ABNORMAL LOW (ref 3.87–5.11)
RDW: 12.7 % (ref 11.5–15.5)
WBC: 4.8 10*3/uL (ref 4.0–10.5)
nRBC: 0 % (ref 0.0–0.2)

## 2020-08-30 LAB — COMPREHENSIVE METABOLIC PANEL
ALT: 10 U/L (ref 0–44)
AST: 18 U/L (ref 15–41)
Albumin: 4.2 g/dL (ref 3.5–5.0)
Alkaline Phosphatase: 72 U/L (ref 38–126)
Anion gap: 12 (ref 5–15)
BUN: 26 mg/dL — ABNORMAL HIGH (ref 8–23)
CO2: 26 mmol/L (ref 22–32)
Calcium: 9.2 mg/dL (ref 8.9–10.3)
Chloride: 103 mmol/L (ref 98–111)
Creatinine, Ser: 1.63 mg/dL — ABNORMAL HIGH (ref 0.44–1.00)
GFR, Estimated: 31 mL/min — ABNORMAL LOW (ref >60)
Glucose, Bld: 89 mg/dL (ref 70–99)
Potassium: 4.2 mmol/L (ref 3.5–5.1)
Sodium: 141 mmol/L (ref 135–145)
Total Bilirubin: 0.5 mg/dL (ref 0.3–1.2)
Total Protein: 7.1 g/dL (ref 6.5–8.1)

## 2020-08-30 MED FILL — Eltrombopag Olamine Tab 25 MG (Base Equiv): ORAL | 30 days supply | Qty: 30 | Fill #1 | Status: AC
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
## 2020-08-30 NOTE — Assessment & Plan Note (Addendum)
#  Stage IV recurrent adenocarcinoma the lung; EGFR mutated. On  osimertinib 80 mg [Feb 6th 2020]. 09FG,1829- No evidence of FDG avid local tumor recurrence or metastatic disease, status post right lower lobectomy; postsurgical changes suggestive gastrectomy; however mild uptake with SUV 4.7; without any soft tissue changes noted [see below]; STABLE.   # currently on Osimertinib 80 mg/day; tolerating well.   # ITP- chronic- platelets ~ 123 on promacta 25 mg/day STABLE.   #Stomach cancer stage I status post gastrectomy; PET scan-mild uptake at the cosmetic site without any soft tissue lesion.  Awaiting EGD on June 16th- [facility].   # right sided stroke/ seizures-on asprin; off plavix;; f/u neurology-[place]-STABLE.   # CKD- stage IV; Left Hydronephrosis s/p ureteral stent placement/nephrolithiasis-~ GFR 31 today; on K-citrate BID; STABLE  # DISPOSITION: # follow up in 2 month;  MD;labs- cbc/cmp; Dr.B

## 2020-09-01 ENCOUNTER — Other Ambulatory Visit (HOSPITAL_COMMUNITY): Payer: Self-pay

## 2020-09-02 ENCOUNTER — Other Ambulatory Visit: Payer: Self-pay | Admitting: *Deleted

## 2020-09-02 DIAGNOSIS — N200 Calculus of kidney: Secondary | ICD-10-CM

## 2020-09-06 ENCOUNTER — Encounter: Payer: Self-pay | Admitting: Urology

## 2020-09-06 ENCOUNTER — Other Ambulatory Visit: Payer: Self-pay

## 2020-09-06 ENCOUNTER — Ambulatory Visit: Payer: Medicare PPO | Admitting: Urology

## 2020-09-06 ENCOUNTER — Ambulatory Visit
Admission: RE | Admit: 2020-09-06 | Discharge: 2020-09-06 | Disposition: A | Discharge status: Home / Self Care | Payer: Medicare PPO | Attending: Urology | Admitting: Urology

## 2020-09-06 ENCOUNTER — Ambulatory Visit
Admission: RE | Admit: 2020-09-06 | Discharge: 2020-09-06 | Disposition: A | Discharge status: Home / Self Care | Payer: Medicare PPO | Source: Ambulatory Visit | Attending: Urology | Admitting: Urology

## 2020-09-06 ENCOUNTER — Telehealth: Payer: Self-pay | Admitting: Pharmacy Technician

## 2020-09-06 VITALS — BP 161/69 | HR 85 | Ht 61.0 in | Wt 111.0 lb

## 2020-09-06 DIAGNOSIS — N200 Calculus of kidney: Secondary | ICD-10-CM | POA: Diagnosis present

## 2020-09-06 DIAGNOSIS — N1832 Chronic kidney disease, stage 3b: Secondary | ICD-10-CM | POA: Diagnosis not present

## 2020-09-06 NOTE — Telephone Encounter (Signed)
Oral Oncology Patient Advocate Encounter  Received notification from [facility] that the existing prior authorization for Tagrisso is due for renewal.  Renewal PA submitted on CoverMyMeds Key [id number] Status is pending  Oral Oncology Clinic will continue to follow.  [place] Patient [place] Phone [phone] Fax [phone] 09/06/2020 9:54 AM

## 2020-09-06 NOTE — Telephone Encounter (Signed)
Oral Oncology Patient Advocate Encounter  Prior Authorization for [name] has been approved.    PA# [id number] Effective dates: 09/06/20 through 03/05/21  Oral Oncology Clinic will continue to follow.   [place] Patient [place] Phone [phone] Fax [phone] 09/06/2020 10:08 AM
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
## 2020-09-15 ENCOUNTER — Ambulatory Visit (INDEPENDENT_AMBULATORY_CARE_PROVIDER_SITE_OTHER): Payer: Medicare PPO

## 2020-09-15 DIAGNOSIS — I459 Conduction disorder, unspecified: Secondary | ICD-10-CM

## 2020-09-15 LAB — CUP PACEART REMOTE DEVICE CHECK
Battery Remaining Longevity: 89 mo
Battery Voltage: 3 V
Brady Statistic AS VP Percent: 71.47 %
Brady Statistic AS VS Percent: 0 %
Brady Statistic RV Percent Paced: 99.95 %
Date Time Interrogation Session: 20220609071100
Implantable Pulse Generator Implant Date: 20200901
Lead Channel Impedance Value: 530 Ohm
Lead Channel Pacing Threshold Amplitude: 0.625 V
Lead Channel Pacing Threshold Pulse Width: 0.24 ms
Lead Channel Sensing Intrinsic Amplitude: 28.238 mV
Lead Channel Setting Pacing Amplitude: 1.125
Lead Channel Setting Pacing Pulse Width: 0.24 ms
Lead Channel Setting Sensing Sensitivity: 2.8 mV

## 2020-09-19 ENCOUNTER — Other Ambulatory Visit: Payer: Self-pay | Admitting: *Deleted

## 2020-09-19 NOTE — Telephone Encounter (Signed)
Pharmacy sent RF request for potassium. Please consider RF

## 2020-09-20 ENCOUNTER — Encounter: Payer: Self-pay | Admitting: Internal Medicine

## 2020-09-20 MED ORDER — POTASSIUM CITRATE ER 10 MEQ (1080 MG) PO TBCR: 10.0000 meq | EXTENDED_RELEASE_TABLET | Freq: Two times a day (BID) | ORAL | 2 refills | Status: DC

## 2020-09-22 ENCOUNTER — Encounter: Payer: Self-pay | Admitting: *Deleted

## 2020-09-23 ENCOUNTER — Encounter: Payer: Self-pay | Admitting: *Deleted

## 2020-09-23 ENCOUNTER — Ambulatory Visit: Payer: Medicare PPO | Admitting: Certified Registered"

## 2020-09-23 ENCOUNTER — Encounter
Admission: RE | Disposition: A | Discharge status: Home / Self Care | Payer: Self-pay | Source: Home / Self Care | Attending: Gastroenterology

## 2020-09-23 ENCOUNTER — Other Ambulatory Visit: Payer: Self-pay

## 2020-09-23 ENCOUNTER — Ambulatory Visit
Admission: RE | Admit: 2020-09-23 | Discharge: 2020-09-23 | Disposition: A | Discharge status: Home / Self Care | Payer: Medicare PPO | Attending: Gastroenterology | Admitting: Gastroenterology

## 2020-09-23 DIAGNOSIS — Z882 Allergy status to sulfonamides status: Secondary | ICD-10-CM | POA: Insufficient documentation

## 2020-09-23 DIAGNOSIS — K2950 Unspecified chronic gastritis without bleeding: Secondary | ICD-10-CM | POA: Insufficient documentation

## 2020-09-23 DIAGNOSIS — Z888 Allergy status to other drugs, medicaments and biological substances status: Secondary | ICD-10-CM | POA: Diagnosis not present

## 2020-09-23 DIAGNOSIS — Z955 Presence of coronary angioplasty implant and graft: Secondary | ICD-10-CM | POA: Insufficient documentation

## 2020-09-23 DIAGNOSIS — Z952 Presence of prosthetic heart valve: Secondary | ICD-10-CM | POA: Diagnosis not present

## 2020-09-23 DIAGNOSIS — Z85028 Personal history of other malignant neoplasm of stomach: Secondary | ICD-10-CM | POA: Insufficient documentation

## 2020-09-23 DIAGNOSIS — Z950 Presence of cardiac pacemaker: Secondary | ICD-10-CM | POA: Diagnosis not present

## 2020-09-23 DIAGNOSIS — Z85118 Personal history of other malignant neoplasm of bronchus and lung: Secondary | ICD-10-CM | POA: Diagnosis not present

## 2020-09-23 DIAGNOSIS — R933 Abnormal findings on diagnostic imaging of other parts of digestive tract: Secondary | ICD-10-CM | POA: Insufficient documentation

## 2020-09-23 DIAGNOSIS — Z79899 Other long term (current) drug therapy: Secondary | ICD-10-CM | POA: Insufficient documentation

## 2020-09-23 DIAGNOSIS — Z887 Allergy status to serum and vaccine status: Secondary | ICD-10-CM | POA: Diagnosis not present

## 2020-09-23 DIAGNOSIS — Z7982 Long term (current) use of aspirin: Secondary | ICD-10-CM | POA: Insufficient documentation

## 2020-09-23 DIAGNOSIS — Z980 Intestinal bypass and anastomosis status: Secondary | ICD-10-CM | POA: Diagnosis not present

## 2020-09-23 DIAGNOSIS — K31A15 Gastric intestinal metaplasia without dysplasia, involving multiple sites: Secondary | ICD-10-CM | POA: Insufficient documentation

## 2020-09-23 HISTORY — DX: Unspecified abdominal pain: R10.9

## 2020-09-23 HISTORY — DX: Calculus of kidney: N20.0

## 2020-09-23 HISTORY — DX: Malignant neoplasm of stomach, unspecified: C16.9

## 2020-09-23 HISTORY — DX: Unspecified asthma, uncomplicated: J45.909

## 2020-09-23 HISTORY — DX: Benign neoplasm of peripheral nerves and autonomic nervous system, unspecified: D36.10

## 2020-09-23 HISTORY — DX: Other specified postprocedural states: Z98.890

## 2020-09-23 HISTORY — DX: Polyp of colon: K63.5

## 2020-09-23 HISTORY — DX: Nausea with vomiting, unspecified: R11.2

## 2020-09-23 HISTORY — DX: Chest pain, unspecified: R07.9

## 2020-09-23 HISTORY — DX: Gastric ulcer, unspecified as acute or chronic, without hemorrhage or perforation: K25.9

## 2020-09-23 HISTORY — DX: Nausea with vomiting, unspecified: Z98.890

## 2020-09-23 HISTORY — DX: Peripheral vascular disease, unspecified: I73.9

## 2020-09-23 HISTORY — DX: Other gastritis without bleeding: K29.60

## 2020-09-23 HISTORY — DX: Cortical age-related cataract, unspecified eye: H25.019

## 2020-09-23 HISTORY — DX: Allergy status to unspecified drugs, medicaments and biological substances: Z88.9

## 2020-09-23 HISTORY — DX: Sciatica, unspecified side: M54.30

## 2020-09-23 HISTORY — DX: Type 2 diabetes mellitus without complications: E11.9

## 2020-09-23 HISTORY — DX: Atherosclerotic heart disease of native coronary artery without angina pectoris: I25.10

## 2020-09-23 HISTORY — DX: Personal history of transient ischemic attack (TIA), and cerebral infarction without residual deficits: Z86.73

## 2020-09-23 HISTORY — PX: ESOPHAGOGASTRODUODENOSCOPY (EGD) WITH PROPOFOL: SHX5813

## 2020-09-23 SURGERY — ESOPHAGOGASTRODUODENOSCOPY (EGD) WITH PROPOFOL
Anesthesia: General

## 2020-09-23 MED ORDER — PROPOFOL 10 MG/ML IV BOLUS
INTRAVENOUS | Status: DC | PRN
  Administered 2020-09-23: 70 ug via INTRAVENOUS

## 2020-09-23 MED ORDER — LIDOCAINE HCL (CARDIAC) PF 100 MG/5ML IV SOSY
PREFILLED_SYRINGE | INTRAVENOUS | Status: DC | PRN
  Administered 2020-09-23: 40 mg via INTRAVENOUS

## 2020-09-23 MED ORDER — SODIUM CHLORIDE 0.9 % IV SOLN: INTRAVENOUS | Status: DC
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
## 2020-09-23 NOTE — Anesthesia Postprocedure Evaluation (Signed)
Anesthesia Post Note  Patient: Jacci Ruberg Helfman  Procedure(s) Performed: ESOPHAGOGASTRODUODENOSCOPY (EGD) WITH PROPOFOL  Patient location during evaluation: Endoscopy Anesthesia Type: General Level of consciousness: awake and alert and oriented Pain management: pain level controlled Vital Signs Assessment: post-procedure vital signs reviewed and stable Respiratory status: spontaneous breathing, nonlabored ventilation and respiratory function stable Cardiovascular status: blood pressure returned to baseline and stable Postop Assessment: no signs of nausea or vomiting Anesthetic complications: no   No notable events documented.   Last Vitals:  Vitals:   09/23/20 0722 09/23/20 0848  BP: (!) 153/97 (!) 121/58  Pulse: 95   Resp: 16   Temp: (!) 36.2 C (!) 36.1 C  SpO2: 100%     Last Pain:  Vitals:   09/23/20 0908  TempSrc:   PainSc: 0-No pain                 Rashel Okeefe

## 2020-09-23 NOTE — Transfer of Care (Signed)
Immediate Anesthesia Transfer of Care Note  Patient: Marnette Perkins Dimarzo  Procedure(s) Performed: ESOPHAGOGASTRODUODENOSCOPY (EGD) WITH PROPOFOL  Patient Location: PACU and Endoscopy Unit  Anesthesia Type:General  Level of Consciousness: drowsy and patient cooperative  Airway & Oxygen Therapy: Patient Spontanous Breathing  Post-op Assessment: Report given to RN and Post -op Vital signs reviewed and stable  Post vital signs: Reviewed and stable  Last Vitals:  Vitals Value Taken Time  BP 121/58 09/23/20 0848  Temp    Pulse 81 09/23/20 0849  Resp 17 09/23/20 0849  SpO2 97 % 09/23/20 0849  Vitals shown include unvalidated device data.  Last Pain:  Vitals:   09/23/20 0722  TempSrc: Temporal  PainSc: 0-No pain         Complications: No notable events documented.

## 2020-09-23 NOTE — Interval H&P Note (Signed)
History and Physical Interval Note:  09/23/2020 8:31 AM  Stacy Cook  has presented today for surgery, with the diagnosis of ABN PET SCAN.  The various methods of treatment have been discussed with the patient and family. After consideration of risks, benefits and other options for treatment, the patient has consented to  Procedure(s) with comments: ESOPHAGOGASTRODUODENOSCOPY (EGD) WITH PROPOFOL (N/A) - KC SAYS "AMPICILLIN IS NEEDED" as a surgical intervention.  The patient's history has been reviewed, patient examined, no change in status, stable for surgery.  I have reviewed the patient's chart and labs.  Questions were answered to the patient's satisfaction.     Lesly Rubenstein  Ok to proceed with EGD

## 2020-09-23 NOTE — Op Note (Addendum)
[facility] Patient Name: Stacy Cook Procedure Date: 09/23/2020 8:31 AM MRN: [id number] Account #: [id number] Date of Birth: Jul 15, 1935 Admit Type: Outpatient Age: 85 Room: [facility] ENDO ROOM 3 Gender: Female Note Status: Supervisor Override Procedure:             Upper GI endoscopy Indications:           Abnormal PET scan of the GI tract, Personal history of                         malignant gastric neoplasm Providers:             Andrey Farmer MD, MD Referring MD:          Ramonita Lab, MD (Referring MD) Medicines:             Monitored Anesthesia Care Complications:         No immediate complications. Estimated blood loss:                         Minimal. Procedure:             Pre-Anesthesia Assessment:                        - Prior to the procedure, a History and Physical was                         performed, and patient medications and allergies were                         reviewed. The patient is competent. The risks and                         benefits of the procedure and the sedation options and                         risks were discussed with the patient. All questions                         were answered and informed consent was obtained.                         Patient identification and proposed procedure were                         verified by the physician, the nurse, the anesthetist                         and the technician in the endoscopy suite. Mental                         Status Examination: alert and oriented. Airway                         Examination: normal oropharyngeal airway and neck                         mobility. Respiratory Examination: clear to  auscultation. CV Examination: normal. Prophylactic                         Antibiotics: The patient does not require prophylactic                         antibiotics. Prior Anticoagulants: The patient has                         taken no  previous anticoagulant or antiplatelet                         agents. ASA Grade Assessment: III - A patient with                         severe systemic disease. After reviewing the risks and                         benefits, the patient was deemed in satisfactory                         condition to undergo the procedure. The anesthesia                         plan was to use monitored anesthesia care (MAC).                         Immediately prior to administration of medications,                         the patient was re-assessed for adequacy to receive                         sedatives. The heart rate, respiratory rate, oxygen                         saturations, blood pressure, adequacy of pulmonary                         ventilation, and response to care were monitored                         throughout the procedure. The physical status of the                         patient was re-assessed after the procedure.                        After obtaining informed consent, the endoscope was                         passed under direct vision. Throughout the procedure,                         the patient's blood pressure, pulse, and oxygen                         saturations were monitored continuously. The Endoscope                           was introduced through the mouth, and advanced to the                         second part of duodenum. The upper GI endoscopy was                         accomplished without difficulty. The patient tolerated                         the procedure well. Findings:      The examined esophagus was normal.      Evidence of a patent Billroth I gastroduodenostomy was found. A gastric       pouch was found containing food debris. The gastroduodenal anastomosis       was characterized by erythema. This was traversed. Biopsies were taken       with a cold forceps for histology. Estimated blood loss was minimal.      Patchy mild inflammation characterized by  erythema was found in the       gastric body. Biopsies were taken with a cold forceps for histology.       Estimated blood loss was minimal.      The examined duodenum was normal. Impression:            - Normal esophagus.                        - Patent Billroth I gastroduodenostomy was found,                         characterized by erythema. Biopsied.                        - Gastritis. Biopsied.                        - Normal examined duodenum. Recommendation:        - Discharge patient to home.                        - Resume previous diet.                        - Continue present medications.                        - Await pathology results.                        - Return to referring physician as previously                         scheduled. Procedure Code(s):     --- Professional ---                        [phone], Esophagogastroduodenoscopy, flexible,                         transoral; with biopsy, single or multiple Diagnosis Code(s):     --- Professional ---  Z98.0, Intestinal bypass and anastomosis status                        K29.70, Gastritis, unspecified, without bleeding                        R93.3, Abnormal findings on diagnostic imaging of                         other parts of digestive tract CPT copyright 2019 American Medical Association. All rights reserved. The codes documented in this report are preliminary and upon coder review may  be revised to meet current compliance requirements. Andrey Farmer MD, MD 09/23/2020 8:48:35 AM Number of Addenda: 0 Note Initiated On: 09/23/2020 8:31 AM Estimated Blood Loss:  Estimated blood loss was minimal.      [facility]

## 2020-09-26 ENCOUNTER — Encounter: Payer: Self-pay | Admitting: Gastroenterology

## 2020-09-27 ENCOUNTER — Other Ambulatory Visit: Payer: Self-pay | Admitting: Internal Medicine

## 2020-09-27 ENCOUNTER — Other Ambulatory Visit (HOSPITAL_COMMUNITY): Payer: Self-pay

## 2020-09-27 DIAGNOSIS — C3431 Malignant neoplasm of lower lobe, right bronchus or lung: Secondary | ICD-10-CM

## 2020-09-27 LAB — SURGICAL PATHOLOGY

## 2020-09-27 MED FILL — Eltrombopag Olamine Tab 25 MG (Base Equiv): ORAL | 30 days supply | Qty: 30 | Fill #2 | Status: AC

## 2020-10-03 ENCOUNTER — Other Ambulatory Visit (HOSPITAL_COMMUNITY): Payer: Self-pay

## 2020-10-04 ENCOUNTER — Other Ambulatory Visit: Payer: Self-pay | Admitting: Pharmacist

## 2020-10-04 DIAGNOSIS — C3431 Malignant neoplasm of lower lobe, right bronchus or lung: Secondary | ICD-10-CM

## 2020-10-04 MED ORDER — OSIMERTINIB MESYLATE 80 MG PO TABS: 80.0000 mg | ORAL_TABLET | Freq: Every day | ORAL | 6 refills | Status: DC

## 2020-10-07 NOTE — Progress Notes (Signed)
Remote pacemaker transmission.   

## 2020-10-28 ENCOUNTER — Other Ambulatory Visit (HOSPITAL_COMMUNITY): Payer: Self-pay

## 2020-10-28 MED FILL — Eltrombopag Olamine Tab 25 MG (Base Equiv): ORAL | 30 days supply | Qty: 30 | Fill #3 | Status: AC

## 2020-10-31 ENCOUNTER — Other Ambulatory Visit (HOSPITAL_COMMUNITY): Payer: Self-pay

## 2020-11-01 ENCOUNTER — Inpatient Hospital Stay: Payer: Medicare PPO | Attending: Nurse Practitioner

## 2020-11-01 ENCOUNTER — Inpatient Hospital Stay: Payer: Medicare PPO | Admitting: Nurse Practitioner

## 2020-11-01 ENCOUNTER — Encounter: Payer: Self-pay | Admitting: Nurse Practitioner

## 2020-11-01 VITALS — BP 142/56 | HR 60 | Temp 97.5°F | Resp 17 | Wt 110.0 lb

## 2020-11-01 DIAGNOSIS — Z79899 Other long term (current) drug therapy: Secondary | ICD-10-CM | POA: Insufficient documentation

## 2020-11-01 DIAGNOSIS — Z85028 Personal history of other malignant neoplasm of stomach: Secondary | ICD-10-CM

## 2020-11-01 DIAGNOSIS — C7A092 Malignant carcinoid tumor of the stomach: Secondary | ICD-10-CM | POA: Insufficient documentation

## 2020-11-01 DIAGNOSIS — E785 Hyperlipidemia, unspecified: Secondary | ICD-10-CM | POA: Insufficient documentation

## 2020-11-01 DIAGNOSIS — M810 Age-related osteoporosis without current pathological fracture: Secondary | ICD-10-CM | POA: Insufficient documentation

## 2020-11-01 DIAGNOSIS — Z7982 Long term (current) use of aspirin: Secondary | ICD-10-CM | POA: Diagnosis not present

## 2020-11-01 DIAGNOSIS — K219 Gastro-esophageal reflux disease without esophagitis: Secondary | ICD-10-CM | POA: Diagnosis not present

## 2020-11-01 DIAGNOSIS — D693 Immune thrombocytopenic purpura: Secondary | ICD-10-CM | POA: Diagnosis not present

## 2020-11-01 DIAGNOSIS — E1151 Type 2 diabetes mellitus with diabetic peripheral angiopathy without gangrene: Secondary | ICD-10-CM | POA: Insufficient documentation

## 2020-11-01 DIAGNOSIS — C3431 Malignant neoplasm of lower lobe, right bronchus or lung: Secondary | ICD-10-CM | POA: Insufficient documentation

## 2020-11-01 DIAGNOSIS — I2510 Atherosclerotic heart disease of native coronary artery without angina pectoris: Secondary | ICD-10-CM | POA: Diagnosis not present

## 2020-11-01 DIAGNOSIS — Z8673 Personal history of transient ischemic attack (TIA), and cerebral infarction without residual deficits: Secondary | ICD-10-CM | POA: Insufficient documentation

## 2020-11-01 DIAGNOSIS — K2940 Chronic atrophic gastritis without bleeding: Secondary | ICD-10-CM | POA: Insufficient documentation

## 2020-11-01 DIAGNOSIS — I10 Essential (primary) hypertension: Secondary | ICD-10-CM | POA: Insufficient documentation

## 2020-11-01 DIAGNOSIS — E538 Deficiency of other specified B group vitamins: Secondary | ICD-10-CM | POA: Diagnosis not present

## 2020-11-01 DIAGNOSIS — E1122 Type 2 diabetes mellitus with diabetic chronic kidney disease: Secondary | ICD-10-CM | POA: Diagnosis not present

## 2020-11-01 DIAGNOSIS — C160 Malignant neoplasm of cardia: Secondary | ICD-10-CM | POA: Insufficient documentation

## 2020-11-01 LAB — CBC WITH DIFFERENTIAL/PLATELET
Abs Immature Granulocytes: 0.01 10*3/uL (ref 0.00–0.07)
Basophils Absolute: 0 10*3/uL (ref 0.0–0.1)
Basophils Relative: 1 %
Eosinophils Absolute: 0.1 10*3/uL (ref 0.0–0.5)
Eosinophils Relative: 2 %
HCT: 33.5 % — ABNORMAL LOW (ref 36.0–46.0)
Hemoglobin: 11 g/dL — ABNORMAL LOW (ref 12.0–15.0)
Immature Granulocytes: 0 %
Lymphocytes Relative: 27 %
Lymphs Abs: 1.2 10*3/uL (ref 0.7–4.0)
MCH: 30.7 pg (ref 26.0–34.0)
MCHC: 32.8 g/dL (ref 30.0–36.0)
MCV: 93.6 fL (ref 80.0–100.0)
Monocytes Absolute: 0.3 10*3/uL (ref 0.1–1.0)
Monocytes Relative: 7 %
Neutro Abs: 2.8 10*3/uL (ref 1.7–7.7)
Neutrophils Relative %: 63 %
Platelets: 106 10*3/uL — ABNORMAL LOW (ref 150–400)
RBC: 3.58 MIL/uL — ABNORMAL LOW (ref 3.87–5.11)
RDW: 12.5 % (ref 11.5–15.5)
WBC: 4.4 10*3/uL (ref 4.0–10.5)
nRBC: 0 % (ref 0.0–0.2)

## 2020-11-01 LAB — COMPREHENSIVE METABOLIC PANEL
ALT: 10 U/L (ref 0–44)
AST: 17 U/L (ref 15–41)
Albumin: 4.2 g/dL (ref 3.5–5.0)
Alkaline Phosphatase: 63 U/L (ref 38–126)
Anion gap: 8 (ref 5–15)
BUN: 27 mg/dL — ABNORMAL HIGH (ref 8–23)
CO2: 26 mmol/L (ref 22–32)
Calcium: 9.2 mg/dL (ref 8.9–10.3)
Chloride: 103 mmol/L (ref 98–111)
Creatinine, Ser: 1.64 mg/dL — ABNORMAL HIGH (ref 0.44–1.00)
GFR, Estimated: 31 mL/min — ABNORMAL LOW (ref >60)
Glucose, Bld: 91 mg/dL (ref 70–99)
Potassium: 4.5 mmol/L (ref 3.5–5.1)
Sodium: 137 mmol/L (ref 135–145)
Total Bilirubin: 0.4 mg/dL (ref 0.3–1.2)
Total Protein: 6.6 g/dL (ref 6.5–8.1)

## 2020-11-01 NOTE — Progress Notes (Signed)
Patient here for oncology follow-up appointment, concerns of dizziness
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
## 2020-11-03 ENCOUNTER — Other Ambulatory Visit (HOSPITAL_COMMUNITY): Payer: Self-pay

## 2020-11-07 ENCOUNTER — Other Ambulatory Visit (HOSPITAL_COMMUNITY): Payer: Self-pay

## 2020-12-01 ENCOUNTER — Other Ambulatory Visit (HOSPITAL_COMMUNITY): Payer: Self-pay

## 2020-12-01 MED FILL — Eltrombopag Olamine Tab 25 MG (Base Equiv): ORAL | 30 days supply | Qty: 30 | Fill #4 | Status: AC

## 2020-12-07 ENCOUNTER — Other Ambulatory Visit (HOSPITAL_COMMUNITY): Payer: Self-pay

## 2020-12-15 ENCOUNTER — Ambulatory Visit (INDEPENDENT_AMBULATORY_CARE_PROVIDER_SITE_OTHER): Payer: Medicare PPO

## 2020-12-15 DIAGNOSIS — I459 Conduction disorder, unspecified: Secondary | ICD-10-CM | POA: Diagnosis not present

## 2020-12-16 LAB — CUP PACEART REMOTE DEVICE CHECK
Battery Remaining Longevity: 86 mo
Battery Voltage: 2.99 V
Brady Statistic AS VP Percent: 71.51 %
Brady Statistic AS VS Percent: 0 %
Brady Statistic RV Percent Paced: 99.95 %
Date Time Interrogation Session: 20220909075300
Implantable Pulse Generator Implant Date: 20200901
Lead Channel Impedance Value: 540 Ohm
Lead Channel Pacing Threshold Amplitude: 0.5 V
Lead Channel Pacing Threshold Pulse Width: 0.24 ms
Lead Channel Sensing Intrinsic Amplitude: 19.688 mV
Lead Channel Setting Pacing Amplitude: 1.125
Lead Channel Setting Pacing Pulse Width: 0.24 ms
Lead Channel Setting Sensing Sensitivity: 2.8 mV

## 2020-12-20 ENCOUNTER — Other Ambulatory Visit (HOSPITAL_COMMUNITY): Payer: Self-pay

## 2020-12-23 NOTE — Progress Notes (Signed)
Remote pacemaker transmission.   

## 2020-12-28 ENCOUNTER — Other Ambulatory Visit: Payer: Self-pay | Admitting: *Deleted

## 2020-12-28 ENCOUNTER — Other Ambulatory Visit (HOSPITAL_COMMUNITY): Payer: Self-pay

## 2020-12-28 DIAGNOSIS — C3431 Malignant neoplasm of lower lobe, right bronchus or lung: Secondary | ICD-10-CM

## 2020-12-29 ENCOUNTER — Other Ambulatory Visit: Payer: Self-pay | Admitting: Internal Medicine

## 2020-12-29 ENCOUNTER — Other Ambulatory Visit (HOSPITAL_COMMUNITY): Payer: Self-pay

## 2020-12-29 DIAGNOSIS — C3431 Malignant neoplasm of lower lobe, right bronchus or lung: Secondary | ICD-10-CM

## 2020-12-29 MED ORDER — ELTROMBOPAG OLAMINE 25 MG PO TABS
ORAL_TABLET | ORAL | 6 refills | Status: DC
  Filled 2021-01-12 – 2021-01-17 (×3): qty 30, 30d supply, fill #0
  Filled 2021-02-07: qty 30, 30d supply, fill #1
  Filled 2021-03-07: qty 30, 30d supply, fill #2
  Filled 2021-04-04: qty 30, 30d supply, fill #3
  Filled 2021-05-01: qty 30, 30d supply, fill #4
  Filled 2021-05-29: qty 30, 30d supply, fill #5
  Filled 2021-06-27: qty 30, 30d supply, fill #6

## 2021-01-02 ENCOUNTER — Inpatient Hospital Stay: Payer: Medicare PPO | Admitting: Internal Medicine

## 2021-01-02 ENCOUNTER — Inpatient Hospital Stay: Payer: Medicare PPO | Attending: Nurse Practitioner

## 2021-01-02 ENCOUNTER — Other Ambulatory Visit (HOSPITAL_COMMUNITY): Payer: Self-pay

## 2021-01-02 VITALS — BP 141/50 | HR 60 | Temp 97.8°F | Resp 18 | Ht 61.0 in | Wt 110.0 lb

## 2021-01-02 DIAGNOSIS — C3431 Malignant neoplasm of lower lobe, right bronchus or lung: Secondary | ICD-10-CM | POA: Insufficient documentation

## 2021-01-02 DIAGNOSIS — Z85828 Personal history of other malignant neoplasm of skin: Secondary | ICD-10-CM | POA: Diagnosis not present

## 2021-01-02 DIAGNOSIS — Z86010 Personal history of colonic polyps: Secondary | ICD-10-CM | POA: Diagnosis not present

## 2021-01-02 DIAGNOSIS — K2940 Chronic atrophic gastritis without bleeding: Secondary | ICD-10-CM | POA: Diagnosis not present

## 2021-01-02 DIAGNOSIS — E119 Type 2 diabetes mellitus without complications: Secondary | ICD-10-CM | POA: Insufficient documentation

## 2021-01-02 DIAGNOSIS — C160 Malignant neoplasm of cardia: Secondary | ICD-10-CM | POA: Diagnosis not present

## 2021-01-02 DIAGNOSIS — D693 Immune thrombocytopenic purpura: Secondary | ICD-10-CM | POA: Diagnosis not present

## 2021-01-02 DIAGNOSIS — R42 Dizziness and giddiness: Secondary | ICD-10-CM | POA: Insufficient documentation

## 2021-01-02 DIAGNOSIS — Z85028 Personal history of other malignant neoplasm of stomach: Secondary | ICD-10-CM | POA: Insufficient documentation

## 2021-01-02 DIAGNOSIS — I10 Essential (primary) hypertension: Secondary | ICD-10-CM | POA: Diagnosis not present

## 2021-01-02 DIAGNOSIS — J45909 Unspecified asthma, uncomplicated: Secondary | ICD-10-CM | POA: Diagnosis not present

## 2021-01-02 DIAGNOSIS — Z79899 Other long term (current) drug therapy: Secondary | ICD-10-CM | POA: Diagnosis not present

## 2021-01-02 DIAGNOSIS — E785 Hyperlipidemia, unspecified: Secondary | ICD-10-CM | POA: Insufficient documentation

## 2021-01-02 DIAGNOSIS — K219 Gastro-esophageal reflux disease without esophagitis: Secondary | ICD-10-CM | POA: Insufficient documentation

## 2021-01-02 DIAGNOSIS — E538 Deficiency of other specified B group vitamins: Secondary | ICD-10-CM | POA: Insufficient documentation

## 2021-01-02 DIAGNOSIS — Z8673 Personal history of transient ischemic attack (TIA), and cerebral infarction without residual deficits: Secondary | ICD-10-CM | POA: Insufficient documentation

## 2021-01-02 DIAGNOSIS — E1151 Type 2 diabetes mellitus with diabetic peripheral angiopathy without gangrene: Secondary | ICD-10-CM | POA: Insufficient documentation

## 2021-01-02 DIAGNOSIS — Z87442 Personal history of urinary calculi: Secondary | ICD-10-CM | POA: Insufficient documentation

## 2021-01-02 DIAGNOSIS — E1122 Type 2 diabetes mellitus with diabetic chronic kidney disease: Secondary | ICD-10-CM | POA: Insufficient documentation

## 2021-01-02 DIAGNOSIS — N179 Acute kidney failure, unspecified: Secondary | ICD-10-CM | POA: Insufficient documentation

## 2021-01-02 DIAGNOSIS — Z7982 Long term (current) use of aspirin: Secondary | ICD-10-CM | POA: Diagnosis not present

## 2021-01-02 DIAGNOSIS — I2510 Atherosclerotic heart disease of native coronary artery without angina pectoris: Secondary | ICD-10-CM | POA: Diagnosis not present

## 2021-01-02 LAB — CBC WITH DIFFERENTIAL/PLATELET
Abs Immature Granulocytes: 0.01 10*3/uL (ref 0.00–0.07)
Basophils Absolute: 0 10*3/uL (ref 0.0–0.1)
Basophils Relative: 0 %
Eosinophils Absolute: 0.1 10*3/uL (ref 0.0–0.5)
Eosinophils Relative: 3 %
HCT: 33.2 % — ABNORMAL LOW (ref 36.0–46.0)
Hemoglobin: 11.1 g/dL — ABNORMAL LOW (ref 12.0–15.0)
Immature Granulocytes: 0 %
Lymphocytes Relative: 21 %
Lymphs Abs: 1 10*3/uL (ref 0.7–4.0)
MCH: 31.1 pg (ref 26.0–34.0)
MCHC: 33.4 g/dL (ref 30.0–36.0)
MCV: 93 fL (ref 80.0–100.0)
Monocytes Absolute: 0.4 10*3/uL (ref 0.1–1.0)
Monocytes Relative: 8 %
Neutro Abs: 3.2 10*3/uL (ref 1.7–7.7)
Neutrophils Relative %: 68 %
Platelets: 107 10*3/uL — ABNORMAL LOW (ref 150–400)
RBC: 3.57 MIL/uL — ABNORMAL LOW (ref 3.87–5.11)
RDW: 12.7 % (ref 11.5–15.5)
WBC: 4.7 10*3/uL (ref 4.0–10.5)
nRBC: 0 % (ref 0.0–0.2)

## 2021-01-02 LAB — COMPREHENSIVE METABOLIC PANEL
ALT: 10 U/L (ref 0–44)
AST: 17 U/L (ref 15–41)
Albumin: 4 g/dL (ref 3.5–5.0)
Alkaline Phosphatase: 65 U/L (ref 38–126)
Anion gap: 8 (ref 5–15)
BUN: 25 mg/dL — ABNORMAL HIGH (ref 8–23)
CO2: 27 mmol/L (ref 22–32)
Calcium: 8.8 mg/dL — ABNORMAL LOW (ref 8.9–10.3)
Chloride: 102 mmol/L (ref 98–111)
Creatinine, Ser: 1.6 mg/dL — ABNORMAL HIGH (ref 0.44–1.00)
GFR, Estimated: 32 mL/min — ABNORMAL LOW (ref >60)
Glucose, Bld: 94 mg/dL (ref 70–99)
Potassium: 4.1 mmol/L (ref 3.5–5.1)
Sodium: 137 mmol/L (ref 135–145)
Total Bilirubin: 0.2 mg/dL — ABNORMAL LOW (ref 0.3–1.2)
Total Protein: 6.7 g/dL (ref 6.5–8.1)

## 2021-01-02 MED ORDER — MECLIZINE HCL 25 MG PO TABS: ORAL_TABLET | ORAL | 0 refills | Status: DC
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
## 2021-01-02 NOTE — Assessment & Plan Note (Signed)
#  Stage IV recurrent adenocarcinoma the lung; EGFR mutated. On  osimertinib 80 mg [Feb 6th 2020].[place] 37JI,9678- No evidence of FDG avid local tumor recurrence or metastatic disease, status post right lower lobectomy; postsurgical changes suggestive gastrectomy; however mild uptake with SUV 4.7; without any soft tissue changes noted [see below]; STABLE.   # currently on Osimertinib 80 mg/day; tolerating well. Will repeat CT non-contras CAP  # ITP- chronic- platelets> 100 on promacta 25 mg/day- STABLE.Marland Kitchen   #Stomach cancer stage I status post gastrectomy; June 2022- S/p EGD- [facility].   # right sided stroke/ seizures-on asprin; off plavix;; f/u neurology-[place]-STABLE  # CKD- stage IV; Left Hydronephrosis s/p ureteral stent placement/nephrolithiasis-~ GFR 31 today; on K-citrate BID- STABLE.  # Vertigo- chronic- acute- ordered meclizine prn. script sent.   # DISPOSITION: # follow up in 6 weeks-;  MD;labs- cbc/cmp;CT chest/AP Dr.B

## 2021-01-12 ENCOUNTER — Other Ambulatory Visit (HOSPITAL_COMMUNITY): Payer: Self-pay

## 2021-01-13 ENCOUNTER — Other Ambulatory Visit (HOSPITAL_COMMUNITY): Payer: Self-pay

## 2021-01-16 ENCOUNTER — Other Ambulatory Visit (HOSPITAL_COMMUNITY): Payer: Self-pay

## 2021-01-17 ENCOUNTER — Other Ambulatory Visit (HOSPITAL_COMMUNITY): Payer: Self-pay

## 2021-01-17 ENCOUNTER — Telehealth: Payer: Self-pay | Admitting: Pharmacy Technician

## 2021-01-17 NOTE — Telephone Encounter (Signed)
Oral Oncology Patient Advocate Encounter   Received notification from [facility] that the existing prior authorization for Promacta is due for renewal.   Renewal PA submitted over the phone on 01/16/21 due to [facility] not loading PA questions. EOC#  [id number] Status is pending   Oral Oncology Clinic will continue to follow.  [place] Patient [place] Phone [phone] Fax [phone] 01/17/2021 8:41 AM

## 2021-01-17 NOTE — Telephone Encounter (Signed)
Oral Oncology Patient Advocate Encounter  Prior Authorization for Stacy Cook has been approved.    PA# [id number] Effective dates: 01/16/21 through 07/17/21  Patients co-pay is $100.00  Oral Oncology Clinic will continue to follow.   [place] Patient [place] Phone [phone] Fax [phone] 01/17/2021 8:43 AM

## 2021-01-18 ENCOUNTER — Other Ambulatory Visit (HOSPITAL_COMMUNITY): Payer: Self-pay

## 2021-01-29 ENCOUNTER — Other Ambulatory Visit: Payer: Self-pay | Admitting: Cardiovascular Disease

## 2021-02-06 ENCOUNTER — Ambulatory Visit
Admission: RE | Admit: 2021-02-06 | Discharge: 2021-02-06 | Disposition: A | Discharge status: Home / Self Care | Payer: Medicare PPO | Source: Ambulatory Visit | Attending: Internal Medicine | Admitting: Internal Medicine

## 2021-02-06 ENCOUNTER — Other Ambulatory Visit: Payer: Self-pay | Admitting: Internal Medicine

## 2021-02-06 ENCOUNTER — Other Ambulatory Visit: Payer: Self-pay

## 2021-02-06 ENCOUNTER — Telehealth: Payer: Self-pay | Admitting: Pharmacy Technician

## 2021-02-06 DIAGNOSIS — C160 Malignant neoplasm of cardia: Secondary | ICD-10-CM | POA: Insufficient documentation

## 2021-02-06 DIAGNOSIS — C3431 Malignant neoplasm of lower lobe, right bronchus or lung: Secondary | ICD-10-CM | POA: Diagnosis present

## 2021-02-06 NOTE — Telephone Encounter (Signed)
Oral Oncology Patient Advocate Encounter   Received notification from [facility] that the existing prior authorization for Tagrisso is due for renewal.   Renewal PA submitted on CoverMyMeds Key [id number] Status is pending   Oral Oncology Clinic will continue to follow.  [place] Patient Stacy Cook Phone [phone] Fax [phone] 02/06/2021 10:37 AM

## 2021-02-07 ENCOUNTER — Other Ambulatory Visit (HOSPITAL_COMMUNITY): Payer: Self-pay

## 2021-02-07 NOTE — Telephone Encounter (Signed)
Oral Oncology Patient Advocate Encounter  Prior Authorization for [name] has been approved.    PA# [id number] Effective dates: 02/06/21 through 08/05/21  Oral Oncology Clinic will continue to follow.   Stacy Cook Patient [name] Phone [phone] Fax [phone] 02/07/2021 9:35 AM

## 2021-02-13 ENCOUNTER — Other Ambulatory Visit: Payer: Self-pay | Admitting: *Deleted

## 2021-02-13 ENCOUNTER — Inpatient Hospital Stay: Payer: Medicare PPO | Attending: Nurse Practitioner

## 2021-02-13 ENCOUNTER — Inpatient Hospital Stay: Payer: Medicare PPO | Admitting: Internal Medicine

## 2021-02-13 ENCOUNTER — Other Ambulatory Visit: Payer: Self-pay

## 2021-02-13 ENCOUNTER — Encounter: Payer: Self-pay | Admitting: Internal Medicine

## 2021-02-13 ENCOUNTER — Other Ambulatory Visit (HOSPITAL_COMMUNITY): Payer: Self-pay

## 2021-02-13 DIAGNOSIS — D693 Immune thrombocytopenic purpura: Secondary | ICD-10-CM | POA: Diagnosis not present

## 2021-02-13 DIAGNOSIS — J45909 Unspecified asthma, uncomplicated: Secondary | ICD-10-CM | POA: Insufficient documentation

## 2021-02-13 DIAGNOSIS — Z7982 Long term (current) use of aspirin: Secondary | ICD-10-CM | POA: Insufficient documentation

## 2021-02-13 DIAGNOSIS — Z86010 Personal history of colonic polyps: Secondary | ICD-10-CM | POA: Insufficient documentation

## 2021-02-13 DIAGNOSIS — Z87442 Personal history of urinary calculi: Secondary | ICD-10-CM | POA: Diagnosis not present

## 2021-02-13 DIAGNOSIS — C3431 Malignant neoplasm of lower lobe, right bronchus or lung: Secondary | ICD-10-CM | POA: Diagnosis not present

## 2021-02-13 DIAGNOSIS — M5136 Other intervertebral disc degeneration, lumbar region: Secondary | ICD-10-CM | POA: Diagnosis not present

## 2021-02-13 DIAGNOSIS — C160 Malignant neoplasm of cardia: Secondary | ICD-10-CM

## 2021-02-13 DIAGNOSIS — E538 Deficiency of other specified B group vitamins: Secondary | ICD-10-CM | POA: Insufficient documentation

## 2021-02-13 DIAGNOSIS — I129 Hypertensive chronic kidney disease with stage 1 through stage 4 chronic kidney disease, or unspecified chronic kidney disease: Secondary | ICD-10-CM | POA: Insufficient documentation

## 2021-02-13 DIAGNOSIS — Z8673 Personal history of transient ischemic attack (TIA), and cerebral infarction without residual deficits: Secondary | ICD-10-CM | POA: Insufficient documentation

## 2021-02-13 DIAGNOSIS — R42 Dizziness and giddiness: Secondary | ICD-10-CM | POA: Insufficient documentation

## 2021-02-13 DIAGNOSIS — Z85038 Personal history of other malignant neoplasm of large intestine: Secondary | ICD-10-CM | POA: Insufficient documentation

## 2021-02-13 DIAGNOSIS — K219 Gastro-esophageal reflux disease without esophagitis: Secondary | ICD-10-CM | POA: Diagnosis not present

## 2021-02-13 DIAGNOSIS — E1151 Type 2 diabetes mellitus with diabetic peripheral angiopathy without gangrene: Secondary | ICD-10-CM | POA: Diagnosis not present

## 2021-02-13 DIAGNOSIS — E785 Hyperlipidemia, unspecified: Secondary | ICD-10-CM | POA: Insufficient documentation

## 2021-02-13 DIAGNOSIS — I2510 Atherosclerotic heart disease of native coronary artery without angina pectoris: Secondary | ICD-10-CM | POA: Insufficient documentation

## 2021-02-13 DIAGNOSIS — Z79899 Other long term (current) drug therapy: Secondary | ICD-10-CM | POA: Insufficient documentation

## 2021-02-13 DIAGNOSIS — N184 Chronic kidney disease, stage 4 (severe): Secondary | ICD-10-CM | POA: Insufficient documentation

## 2021-02-13 DIAGNOSIS — E1122 Type 2 diabetes mellitus with diabetic chronic kidney disease: Secondary | ICD-10-CM | POA: Diagnosis not present

## 2021-02-13 DIAGNOSIS — K2940 Chronic atrophic gastritis without bleeding: Secondary | ICD-10-CM | POA: Diagnosis not present

## 2021-02-13 LAB — CBC WITH DIFFERENTIAL/PLATELET
Abs Immature Granulocytes: 0.02 10*3/uL (ref 0.00–0.07)
Basophils Absolute: 0 10*3/uL (ref 0.0–0.1)
Basophils Relative: 0 %
Eosinophils Absolute: 0.1 10*3/uL (ref 0.0–0.5)
Eosinophils Relative: 2 %
HCT: 33.1 % — ABNORMAL LOW (ref 36.0–46.0)
Hemoglobin: 10.9 g/dL — ABNORMAL LOW (ref 12.0–15.0)
Immature Granulocytes: 0 %
Lymphocytes Relative: 22 %
Lymphs Abs: 1 10*3/uL (ref 0.7–4.0)
MCH: 30.5 pg (ref 26.0–34.0)
MCHC: 32.9 g/dL (ref 30.0–36.0)
MCV: 92.7 fL (ref 80.0–100.0)
Monocytes Absolute: 0.3 10*3/uL (ref 0.1–1.0)
Monocytes Relative: 7 %
Neutro Abs: 3.1 10*3/uL (ref 1.7–7.7)
Neutrophils Relative %: 69 %
Platelets: 125 10*3/uL — ABNORMAL LOW (ref 150–400)
RBC: 3.57 MIL/uL — ABNORMAL LOW (ref 3.87–5.11)
RDW: 12.8 % (ref 11.5–15.5)
WBC: 4.6 10*3/uL (ref 4.0–10.5)
nRBC: 0 % (ref 0.0–0.2)

## 2021-02-13 LAB — COMPREHENSIVE METABOLIC PANEL
ALT: 13 U/L (ref 0–44)
AST: 18 U/L (ref 15–41)
Albumin: 4.1 g/dL (ref 3.5–5.0)
Alkaline Phosphatase: 72 U/L (ref 38–126)
Anion gap: 8 (ref 5–15)
BUN: 29 mg/dL — ABNORMAL HIGH (ref 8–23)
CO2: 28 mmol/L (ref 22–32)
Calcium: 8.8 mg/dL — ABNORMAL LOW (ref 8.9–10.3)
Chloride: 101 mmol/L (ref 98–111)
Creatinine, Ser: 1.82 mg/dL — ABNORMAL HIGH (ref 0.44–1.00)
GFR, Estimated: 27 mL/min — ABNORMAL LOW (ref >60)
Glucose, Bld: 90 mg/dL (ref 70–99)
Potassium: 4.2 mmol/L (ref 3.5–5.1)
Sodium: 137 mmol/L (ref 135–145)
Total Bilirubin: 0.5 mg/dL (ref 0.3–1.2)
Total Protein: 7.2 g/dL (ref 6.5–8.1)

## 2021-02-13 NOTE — Progress Notes (Signed)
Stacy Cook OFFICE PROGRESS NOTE  Patient Care Team: Adin Hector, MD as PCP - General (Internal Medicine) Burnell Blanks, MD as PCP - Cardiology (Cardiology) [name], MD as PCP - Electrophysiology (Cardiology) Cammie Sickle, MD as Consulting Physician (Internal Medicine) Efrain Sella, MD as Consulting Physician (Gastroenterology)  Cancer Staging Primary malignant neoplasm of right lower lobe of lung [facility]) Staging form: Lung, AJCC 7th Edition - Clinical: No stage assigned - Unsigned    Oncology History Overview Note  # DEC 2017- Adeno ca [GATA; her 2 Neu-NEG]; signet ring [1.5 x3 mm gastric incisura; Dr.Skulskie]; EUS [Dr.Burnbridge]; no significant abnormality noted;  Reviewed at [facility] also. JAN 2018- PET NED. April 2018- S/p partial gastrectomy [Dr.Sankar]- STAGE I ADENO CA; NO adjuvant therapy.  # STAGE I CARCINOID s/p partial gastrectomy   # May 2018- Chronic Atrophic gastritis- Prevpac   # FEB 2017- ADENOCARCINOMA with Lepidic 80%-20% acinar pattern; pT2a [Stage IB;T-2.3cm; visceral pleural invasion present; pN=0 ]; AUG 2017- CT NED;   # DEC 2019- RECURRENT/STAGE IV ADENO LUNG CA- EGFR MUTATED; # Jan 6th 2020-; START Osidemrtinib;  # AUG 2020- SEVERE AS [s/p TAVR; [place]]-complicated by R sided stroke/ seizures/acute renal failure.   # DEC 2nd 2020- START PROMACTA 25 mg/day [ITP]  # Molecular testing: EFGR mutated O130Q [omniseq]  # Palliative: O-1/20   DIAGNOSIS:  #ADENO CA LUNG-STAGE IV #Stomach adeno ca [stage I; dec 2017]  GOALS: pallaitive  CURRENT/MOST RECENT THERAPY - OSIMERTINIB [Jan 6th 2020]      Primary malignant neoplasm of right lower lobe of lung ([place])  12/02/2015 Initial Diagnosis   Primary malignant neoplasm of right lower lobe of lung (HCC)   Adenocarcinoma of gastric cardia (HCC)   INTERVAL HISTORY: Alone.  Ambulating independently.  Stacy Cook 85 y.o.  female pleasant patient above history  of recurrent/stage IV adenocarcinoma lung/EGFR mutated currently on osimertinib is here for follow-up/review results of the CT scan.  No new shortness of breath or cough.  Denies any hospitalizations.  Denies any worsening headaches.  Denies any worsening joint pains back pain.  No swelling in the legs.  Review of Systems  Constitutional:  Positive for malaise/fatigue. Negative for chills, diaphoresis and fever.  HENT:  Negative for nosebleeds and sore throat.   Eyes:  Negative for double vision.  Respiratory:  Negative for hemoptysis, sputum production and wheezing.   Cardiovascular:  Negative for chest pain, palpitations, orthopnea and leg swelling.  Gastrointestinal:  Negative for blood in stool, constipation, melena, nausea and vomiting.  Genitourinary:  Negative for dysuria, frequency and urgency.  Musculoskeletal:  Positive for back pain and joint pain.  Neurological:  Positive for focal weakness. Negative for dizziness, tingling, weakness and headaches.  Endo/Heme/Allergies:  Bruises/bleeds easily.  Psychiatric/Behavioral:  Negative for depression. The patient is not nervous/anxious and does not have insomnia.      PAST MEDICAL HISTORY :  Past Medical History:  Diagnosis Date   Abdominal pain    Acid reflux 03/24/2015   Adenocarcinoma of gastric cardia ([place]) 04/10/2016   Allergic genetic state    Anemia    Arteriosclerosis of coronary artery 03/24/2015   Arthritis    "back, hands" (10/15/2017)   Asthma    B12 deficiency anemia    Bile reflux gastritis    CAD (coronary artery disease)    Cancer of right lung ([place]) 05/09/2015   Dr. Genevive Bi performed Right lower lobe lobectomy.    Carcinoma in situ of body of  stomach 08/03/2016   Carotid stenosis 02/07/2016   Cataract cortical, senile    Chest pain    Colon polyp    Degeneration of intervertebral disc of lumbar region 03/11/2014   Diabetes ([place])    Gastric adenocarcinoma (HCC)    GERD (gastroesophageal reflux disease)     also, history of ulcers   History of kidney stones    History of stroke    HLD (hyperlipidemia) 08/24/2013   Hypertension    Malignant tumor of stomach ([place]) 07/2016   Adenocarcinoma, diffuse, poorly differentiated, signet ring, stage I   Neuritis or radiculitis due to rupture of lumbar intervertebral disc 09/10/2014   Neuroendocrine tumor 03/24/2015   Neuroma    Osteoporosis    Peripheral vascular disease (HCC)    PONV (postoperative nausea and vomiting)    Presence of permanent cardiac pacemaker 12/09/2018   Primary malignant neoplasm of right lower lobe of lung ([place]) 12/02/2015   Renal insufficiency    Renal stone    S/P TAVR (transcatheter aortic valve replacement)    Sciatica    Severe aortic stenosis    Skin cancer    "cut/burned LUE; cut off right eye/nose & cut off chest" (10/15/2017)   Stomach ulcer    Thrombocytopenia ([place])    Type 2 diabetes, diet controlled ([place])    "no RX since stomach OR 07/2016" (10/15/2017)    PAST SURGICAL HISTORY :   Past Surgical History:  Procedure Laterality Date   APPENDECTOMY     CARDIAC CATHETERIZATION  X2 before 10/15/2017   CARDIAC VALVE REPLACEMENT     CATARACT EXTRACTION W/PHACO Left 04/04/2017   Procedure: CATARACT EXTRACTION PHACO AND INTRAOCULAR LENS PLACEMENT ([place]);  Surgeon: Leandrew Koyanagi, MD;  Location: [facility] ORS;  Service: Ophthalmology;  Laterality: Left;  Lot # [id number] H [place] 1:00 Ap 25% CDE 8.54   CATARACT EXTRACTION W/PHACO Right 05/15/2017   Procedure: CATARACT EXTRACTION PHACO AND INTRAOCULAR LENS PLACEMENT (I[facility]);  Surgeon: Leandrew Koyanagi, MD;  Location: [facility];  Service: Ophthalmology;  Laterality: Right;  [place] 01:10 AP% 18.3 CDE 12.91 Fluid pack lot # [id number]   COLONOSCOPY     CORONARY ATHERECTOMY N/A 10/15/2017   Procedure: CORONARY ATHERECTOMY;  Surgeon: Burnell Blanks, MD;  Location: [place] CV LAB;  Service: Cardiovascular;  Laterality: N/A;   CORONARY STENT INTERVENTION N/A 10/15/2017    Procedure: CORONARY STENT INTERVENTION;  Surgeon: Burnell Blanks, MD;  Location: [place] CV LAB;  Service: Cardiovascular;  Laterality: N/A;   CYSTOSCOPY/URETEROSCOPY/HOLMIUM LASER/STENT PLACEMENT Left 08/03/2019   Procedure: CYSTOSCOPY/URETEROSCOPY/HOLMIUM LASER/STENT PLACEMENT;  Surgeon: Hollice Espy, MD;  Location: [facility];  Service: Urology;  Laterality: Left;   ESOPHAGOGASTRODUODENOSCOPY     ESOPHAGOGASTRODUODENOSCOPY (EGD) WITH PROPOFOL N/A 03/27/2016   Procedure: ESOPHAGOGASTRODUODENOSCOPY (EGD) WITH PROPOFOL;  Surgeon: Lollie Sails, MD;  Location: [facility] ENDOSCOPY;  Service: Endoscopy;  Laterality: N/A;   ESOPHAGOGASTRODUODENOSCOPY (EGD) WITH PROPOFOL N/A 05/28/2016   Procedure: ESOPHAGOGASTRODUODENOSCOPY (EGD) WITH PROPOFOL;  Surgeon: Lollie Sails, MD;  Location: [facility] ENDOSCOPY;  Service: Endoscopy;  Laterality: N/A;   ESOPHAGOGASTRODUODENOSCOPY (EGD) WITH PROPOFOL N/A 09/25/2016   Procedure: ESOPHAGOGASTRODUODENOSCOPY (EGD) WITH PROPOFOL;  Surgeon: Christene Lye, MD;  Location: [facility] ENDOSCOPY;  Service: Endoscopy;  Laterality: N/A;   ESOPHAGOGASTRODUODENOSCOPY (EGD) WITH PROPOFOL N/A 12/18/2016   Procedure: ESOPHAGOGASTRODUODENOSCOPY (EGD) WITH PROPOFOL;  Surgeon: Christene Lye, MD;  Location: [facility] ENDOSCOPY;  Service: Endoscopy;  Laterality: N/A;   ESOPHAGOGASTRODUODENOSCOPY (EGD) WITH PROPOFOL N/A 01/23/2017   Procedure: ESOPHAGOGASTRODUODENOSCOPY (EGD) WITH PROPOFOL;  Surgeon: Jamal Collin,  Andreas Newport, MD;  Location: [facility] ENDOSCOPY;  Service: Endoscopy;  Laterality: N/A;   ESOPHAGOGASTRODUODENOSCOPY (EGD) WITH PROPOFOL N/A 03/20/2017   Procedure: ESOPHAGOGASTRODUODENOSCOPY (EGD) WITH PROPOFOL;  Surgeon: Christene Lye, MD;  Location: [facility] ENDOSCOPY;  Service: Endoscopy;  Laterality: N/A;   ESOPHAGOGASTRODUODENOSCOPY (EGD) WITH PROPOFOL N/A 09/23/2020   Procedure: ESOPHAGOGASTRODUODENOSCOPY (EGD) WITH PROPOFOL;  Surgeon: Lesly Rubenstein, MD;  Location: [facility] ENDOSCOPY;  Service: Endoscopy;  Laterality: N/A;  [place] SAYS "AMPICILLIN IS NEEDED"   FRACTURE SURGERY     INSERT / REPLACE / REMOVE PACEMAKER  12/09/2018   PACEMAKER LEADLESS INSERTION N/A 12/09/2018   Procedure: PACEMAKER LEADLESS INSERTION;  Surgeon: [name], MD;  Location: [place] CV LAB;  Service: Cardiovascular;  Laterality: N/A;   PARTIAL GASTRECTOMY N/A 08/03/2016   Hemigastrectomy, [place] I reconstruction Surgeon: Christene Lye, MD;  Location: [facility] ORS;  Service: General;  Laterality: N/A;   RIGHT/LEFT HEART CATH AND CORONARY ANGIOGRAPHY Bilateral 09/19/2017   Procedure: RIGHT/LEFT HEART CATH AND CORONARY ANGIOGRAPHY;  Surgeon: Yolonda Kida, MD;  Location: [place] CV LAB;  Service: Cardiovascular;  Laterality: Bilateral;   SHOULDER ARTHROSCOPY W/ CAPSULAR REPAIR Right    SKIN CANCER EXCISION     "cut/burned LUE; cut off right eye/nose & cut off chest" (10/15/2017)   TEE WITHOUT CARDIOVERSION N/A 12/02/2018   Procedure: TRANSESOPHAGEAL ECHOCARDIOGRAM (TEE);  Surgeon: Burnell Blanks, MD;  Location: [place] CV LAB;  Service: Open Heart Surgery;  Laterality: N/A;   THORACOTOMY Right 05/09/2015   Procedure: THORACOTOMY, RIGHT LOWER LOBECTOMY, BRONCHOSCOPY;  Surgeon: Nestor Lewandowsky, MD;  Location: [facility];  Service: Thoracic;  Laterality: Right;   TONSILLECTOMY  1944   TRANSCATHETER AORTIC VALVE REPLACEMENT, TRANSFEMORAL N/A 12/02/2018   Procedure: TRANSCATHETER AORTIC VALVE REPLACEMENT, TRANSFEMORAL;  Surgeon: Burnell Blanks, MD;  Location: [place] CV LAB;  Service: Open Heart Surgery;  Laterality: N/A;   TUBAL LIGATION     UPPER GI ENDOSCOPY N/A 08/03/2016   Procedure: UPPER  ENDOSCOPY;  Surgeon: Christene Lye, MD;  Location: [facility] OR[facility];  Service: General;  Laterality: N/A;   VAGINAL HYSTERECTOMY     WRIST FRACTURE SURGERY Right     FAMILY HISTORY :   Family History  Problem Relation Age of Onset    Aortic aneurysm Mother    Kidney Stones Mother    Hypertension Mother    Hypertension Father    Diabetes Other    Breast cancer Neg Hx    Prostate cancer Neg Hx    Bladder Cancer Neg Hx    Kidney cancer Neg Hx     SOCIAL HISTORY:   Social History   Tobacco Use   Smoking status: Never   Smokeless tobacco: Never  Vaping Use   Vaping Use: Never used  Substance Use Topics   Alcohol use: Not Currently   Drug use: Never    ALLERGIES:  is allergic to mirtazapine, pneumococcal vaccine, statins, and sulfa antibiotics.  MEDICATIONS:  Current Outpatient Medications  Medication Sig Dispense Refill   aspirin EC 81 MG tablet Take 81 mg by mouth daily.     cholecalciferol (VITAMIN D) 1000 UNITS tablet Take 1,000 Units by mouth daily.     cyanocobalamin 1000 MCG tablet Take 1,000 mcg by mouth daily.      dicyclomine (BENTYL) 10 MG capsule Take 10 mg by mouth 4 (four) times daily.     eltrombopag (PROMACTA) 25 MG tablet TAKE 1 TABLET BY MOUTH DAILY. TAKE ON AN EMPTY STOMACH, 1  HOUR BEFORE A MEAL OR 2 HOURS AFTER. 30 tablet 6   ezetimibe (ZETIA) 10 MG tablet Take 10 mg by mouth daily.     isosorbide mononitrate (IMDUR) 30 MG 24 hr tablet Take 1 tablet by mouth once daily 90 tablet 0   Magnesium 100 MG TABS Take by mouth.     meclizine (ANTIVERT) 25 MG tablet 1 pill at night as needed; if dizziness not improved can take one pill every 8 hours as needed/tolerated. 30 tablet 0   metoprolol succinate (TOPROL-XL) 50 MG 24 hr tablet Take 1 tablet (50 mg total) by mouth every evening. Take with or immediately following a meal. 30 tablet 0   ondansetron (ZOFRAN) 8 MG tablet TAKE 1 TABLET BY MOUTH EVERY 8 HOURS AS NEEDED FOR NAUSEA 40 tablet 0   osimertinib mesylate (TAGRISSO) 80 MG tablet Take 1 tablet (80 mg total) by mouth daily. 30 tablet 6   pantoprazole (PROTONIX) 40 MG tablet Take 40 mg by mouth daily.     potassium citrate (UROCIT-K) 10 MEQ (1080 MG) SR tablet Take 1 tablet (10 mEq total) by  mouth 2 (two) times daily with breakfast and lunch. 90 tablet 2   No current facility-administered medications for this visit.    PHYSICAL EXAMINATION: ECOG PERFORMANCE STATUS: 0 - Asymptomatic  BP 135/75   Pulse (!) 103   Temp 98.7 F (37.1 C)   Resp 20   Wt 110 lb 3.2 oz (50 kg)   SpO2 100%   BMI 20.82 kg/m   Filed Weights   02/13/21 1039  Weight: 110 lb 3.2 oz (50 kg)   Physical Exam Constitutional:      Comments: Patient walking by herself; no assistive devices.  Alone.  HENT:     Head: Normocephalic and atraumatic.     Mouth/Throat:     Pharynx: No oropharyngeal exudate.  Eyes:     Pupils: Pupils are equal, round, and reactive to light.  Cardiovascular:     Rate and Rhythm: Normal rate and regular rhythm.     Heart sounds: Murmur heard.  Pulmonary:     Effort: No respiratory distress.     Breath sounds: No wheezing.  Abdominal:     General: Bowel sounds are normal. There is no distension.     Palpations: Abdomen is soft. There is no mass.     Tenderness: There is no abdominal tenderness. There is no guarding or rebound.  Musculoskeletal:        General: No tenderness. Normal range of motion.     Cervical back: Normal range of motion and neck supple.  Skin:    General: Skin is warm.  Neurological:     Mental Status: She is alert and oriented to person, place, and time.     Comments: Weakness of the right upper extremity more than lower extremity.  Psychiatric:        Mood and Affect: Affect normal.   LABORATORY DATA:  I have reviewed the data as listed    Component Value Date/Time   NA 137 02/13/2021 0943   NA 144 01/24/2018 0847   NA 141 07/21/2012 1529   K 4.2 02/13/2021 0943   K 3.9 07/21/2012 1529   CL 101 02/13/2021 0943   CL 109 (H) 07/21/2012 1529   CO2 28 02/13/2021 0943   CO2 27 07/21/2012 1529   GLUCOSE 90 02/13/2021 0943   GLUCOSE 104 (H) 07/21/2012 1529   BUN 29 (H) 02/13/2021 0943   BUN 24  01/24/2018 0847   BUN 16 07/21/2012 1529    CREATININE 1.82 (H) 02/13/2021 0943   CREATININE 1.11 09/03/2012 1535   CALCIUM 8.8 (L) 02/13/2021 0943   CALCIUM 8.9 07/21/2012 1529   PROT 7.2 02/13/2021 0943   PROT 7.1 07/21/2012 1529   ALBUMIN 4.1 02/13/2021 0943   ALBUMIN 3.9 07/21/2012 1529   AST 18 02/13/2021 0943   AST 26 07/21/2012 1529   ALT 13 02/13/2021 0943   ALT 22 07/21/2012 1529   ALKPHOS 72 02/13/2021 0943   ALKPHOS 76 07/21/2012 1529   BILITOT 0.5 02/13/2021 0943   BILITOT 0.3 07/21/2012 1529   GFRNONAA 27 (L) 02/13/2021 0943   GFRNONAA 49 (L) 09/03/2012 1535   GFRAA 29 (L) 01/05/2020 1440   GFRAA 57 (L) 09/03/2012 1535    No results found for: SPEP, UPEP  Lab Results  Component Value Date   WBC 4.6 02/13/2021   NEUTROABS 3.1 02/13/2021   HGB 10.9 (L) 02/13/2021   HCT 33.1 (L) 02/13/2021   MCV 92.7 02/13/2021   PLT 125 (L) 02/13/2021      Chemistry      Component Value Date/Time   NA 137 02/13/2021 0943   NA 144 01/24/2018 0847   NA 141 07/21/2012 1529   K 4.2 02/13/2021 0943   K 3.9 07/21/2012 1529   CL 101 02/13/2021 0943   CL 109 (H) 07/21/2012 1529   CO2 28 02/13/2021 0943   CO2 27 07/21/2012 1529   BUN 29 (H) 02/13/2021 0943   BUN 24 01/24/2018 0847   BUN 16 07/21/2012 1529   CREATININE 1.82 (H) 02/13/2021 0943   CREATININE 1.11 09/03/2012 1535      Component Value Date/Time   CALCIUM 8.8 (L) 02/13/2021 0943   CALCIUM 8.9 07/21/2012 1529   ALKPHOS 72 02/13/2021 0943   ALKPHOS 76 07/21/2012 1529   AST 18 02/13/2021 0943   AST 26 07/21/2012 1529   ALT 13 02/13/2021 0943   ALT 22 07/21/2012 1529   BILITOT 0.5 02/13/2021 0943   BILITOT 0.3 07/21/2012 1529       RADIOGRAPHIC STUDIES: I have personally reviewed the radiological images as listed and agreed with the findings in the report. No results found.   ASSESSMENT & PLAN:  Primary malignant neoplasm of right lower lobe of lung ([place]) # Stage IV recurrent adenocarcinoma the lung; EGFR mutated. On  osimertinib 80 mg [Feb  6th 2020]. NOV 1st, 2022- IMPRESSION: Status post right lower lobectomy. Small to moderate right pleural effusion, chronic. Status post distal gastrectomy. Additional postsurgical changes, as above.  No evidence of recurrent or metastatic disease. STABLE   # currently on Osimertinib 80 mg/day; tolerating well.  Continue current therapy.  # ITP- chronic- platelets> 125- on promacta 25 mg/day- STABLE.  #Stomach cancer stage I status post gastrectomy; June 2022- S/p EGD- [facility].- STABLE.   # right sided stroke/ seizures-on asprin; off plavix;; f/u neurology-[place]-STABLE.   # CKD- stage IV; Left Hydronephrosis s/p ureteral stent placement/nephrolithiasis-~ GFR 31 today; on K-citrate BID-STABLE.   # Vertigo- chronic-  meclizine prn- STABLE.   # DISPOSITION: # follow up in 2 months;  MD;labs- cbc/cmp; Dr.B  # I reviewed the blood work- with the patient in detail; also reviewed the imaging independently [as summarized above]; and with the patient in detail.     Orders Placed This Encounter  Procedures   CBC    Standing Status:   Future    Standing Expiration Date:   02/13/2022  Comprehensive metabolic panel    Standing Status:   Future    Standing Expiration Date:   02/13/2022   All questions were answered. The patient knows to call the clinic with any problems, questions or concerns.      Cammie Sickle, MD 02/13/2021 12:17 PM

## 2021-02-13 NOTE — Assessment & Plan Note (Addendum)
#  Stage IV recurrent adenocarcinoma the lung; EGFR mutated. On  osimertinib 80 mg [Feb 6th 2020]. NOV 1st, 2022- IMPRESSION: Status post right lower lobectomy. Small to moderate right pleural effusion, chronic. Status post distal gastrectomy. Additional postsurgical changes, as above.  No evidence of recurrent or metastatic disease. STABLE  # currently on Osimertinib 80 mg/day; tolerating well.  Continue current therapy.  # ITP- chronic- platelets> 125- on promacta 25 mg/day- STABLE.  #Stomach cancer stage I status post gastrectomy; June 2022- S/p EGD- [facility].- STABLE.   # right sided stroke/ seizures-on asprin; off plavix;; f/u neurology-[place]-STABLE.   # CKD- stage IV; Left Hydronephrosis s/p ureteral stent placement/nephrolithiasis-~ GFR 31 today; on K-citrate BID-STABLE.   # Vertigo- chronic-  meclizine prn- STABLE.   # DISPOSITION: # follow up in 2 months;  MD;labs- cbc/cmp; Dr.B  # I reviewed the blood work- with the patient in detail; also reviewed the imaging independently [as summarized above]; and with the patient in detail.

## 2021-02-20 ENCOUNTER — Other Ambulatory Visit (INDEPENDENT_AMBULATORY_CARE_PROVIDER_SITE_OTHER): Payer: Self-pay | Admitting: Nurse Practitioner

## 2021-02-20 DIAGNOSIS — I739 Peripheral vascular disease, unspecified: Secondary | ICD-10-CM

## 2021-02-20 DIAGNOSIS — I6523 Occlusion and stenosis of bilateral carotid arteries: Secondary | ICD-10-CM

## 2021-02-21 ENCOUNTER — Other Ambulatory Visit: Payer: Self-pay

## 2021-02-21 ENCOUNTER — Ambulatory Visit (INDEPENDENT_AMBULATORY_CARE_PROVIDER_SITE_OTHER): Payer: Medicare PPO

## 2021-02-21 ENCOUNTER — Ambulatory Visit (INDEPENDENT_AMBULATORY_CARE_PROVIDER_SITE_OTHER): Payer: Medicare PPO | Admitting: Vascular Surgery

## 2021-02-21 VITALS — BP 178/63 | HR 69 | Ht 61.0 in | Wt 112.0 lb

## 2021-02-21 DIAGNOSIS — E1159 Type 2 diabetes mellitus with other circulatory complications: Secondary | ICD-10-CM

## 2021-02-21 DIAGNOSIS — I10 Essential (primary) hypertension: Secondary | ICD-10-CM | POA: Diagnosis not present

## 2021-02-21 DIAGNOSIS — I6523 Occlusion and stenosis of bilateral carotid arteries: Secondary | ICD-10-CM

## 2021-02-21 DIAGNOSIS — I739 Peripheral vascular disease, unspecified: Secondary | ICD-10-CM

## 2021-02-21 NOTE — Assessment & Plan Note (Signed)
Duplex today shows stable 1 to 39% right ICA stenosis and 40 to 59% left ICA stenosis without significant progression from previous studies.  No focal symptoms.  Continue annual follow-up with duplex.  Continue current medical regimen
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
## 2021-02-21 NOTE — Assessment & Plan Note (Signed)
ABIs today are 1.31 on the right and 1.10 on the left with multiphasic waveforms and normal digital pressures.  There may be some elevation of the pressures due to calcification, but overall her perfusion is well-maintained.  No role for intervention at this level.  We can recheck in 1 year.

## 2021-03-07 ENCOUNTER — Other Ambulatory Visit (HOSPITAL_COMMUNITY): Payer: Self-pay

## 2021-03-10 ENCOUNTER — Other Ambulatory Visit (HOSPITAL_COMMUNITY): Payer: Self-pay

## 2021-03-14 ENCOUNTER — Other Ambulatory Visit (HOSPITAL_COMMUNITY): Payer: Self-pay

## 2021-03-16 ENCOUNTER — Ambulatory Visit (INDEPENDENT_AMBULATORY_CARE_PROVIDER_SITE_OTHER): Payer: Medicare PPO

## 2021-03-16 DIAGNOSIS — I459 Conduction disorder, unspecified: Secondary | ICD-10-CM | POA: Diagnosis not present

## 2021-03-17 LAB — CUP PACEART REMOTE DEVICE CHECK
Battery Remaining Longevity: 82 mo
Battery Voltage: 2.99 V
Brady Statistic AS VP Percent: 72.42 %
Brady Statistic AS VS Percent: 0 %
Brady Statistic RV Percent Paced: 99.96 %
Date Time Interrogation Session: 20221209111300
Implantable Pulse Generator Implant Date: 20200901
Lead Channel Impedance Value: 510 Ohm
Lead Channel Pacing Threshold Amplitude: 0.5 V
Lead Channel Pacing Threshold Pulse Width: 0.24 ms
Lead Channel Sensing Intrinsic Amplitude: 14.625 mV
Lead Channel Setting Pacing Amplitude: 1.125
Lead Channel Setting Pacing Pulse Width: 0.24 ms
Lead Channel Setting Sensing Sensitivity: 2.8 mV

## 2021-03-24 NOTE — Progress Notes (Signed)
Remote pacemaker transmission.   
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
## 2021-03-27 ENCOUNTER — Encounter: Payer: Self-pay | Admitting: Student

## 2021-03-27 ENCOUNTER — Ambulatory Visit: Payer: Medicare PPO | Admitting: Student

## 2021-03-27 VITALS — BP 160/58 | HR 76 | Ht 61.0 in | Wt 113.4 lb

## 2021-03-27 DIAGNOSIS — Z952 Presence of prosthetic heart valve: Secondary | ICD-10-CM | POA: Diagnosis not present

## 2021-03-27 DIAGNOSIS — I5032 Chronic diastolic (congestive) heart failure: Secondary | ICD-10-CM

## 2021-03-27 DIAGNOSIS — I459 Conduction disorder, unspecified: Secondary | ICD-10-CM | POA: Diagnosis not present

## 2021-03-27 DIAGNOSIS — I10 Essential (primary) hypertension: Secondary | ICD-10-CM | POA: Diagnosis not present

## 2021-03-27 LAB — CUP PACEART INCLINIC DEVICE CHECK
Date Time Interrogation Session: 20221219111948
Implantable Pulse Generator Implant Date: 20200901

## 2021-03-27 NOTE — Patient Instructions (Signed)
Medication Instructions:  Your physician recommends that you continue on your current medications as directed. Please refer to the Current Medication list given to you today.  *If you need a refill on your cardiac medications before your next appointment, please call your pharmacy*   Lab Work: None If you have labs (blood work) drawn today and your tests are completely normal, you will receive your results only by: [place] (if you have MyChart) OR A paper copy in the mail If you have any lab test that is abnormal or we need to change your treatment, we will call you to review the results.   Follow-Up: At [facility], you and your health needs are our priority.  As part of our continuing mission to provide you with exceptional heart care, we have created designated Provider Care Teams.  These Care Teams include your primary Cardiologist (physician) and Advanced Practice Providers (APPs -  Physician Assistants and Nurse Practitioners) who all work together to provide you with the care you need, when you need it.  Your next appointment:   1 year(s)  The format for your next appointment:   In Person  Provider:   You may see Lars Mage, MD or one of the following Advanced Practice Providers on your designated Care Team:   Tommye Standard, [place] [facility]" [place], [place]

## 2021-04-04 ENCOUNTER — Other Ambulatory Visit (HOSPITAL_COMMUNITY): Payer: Self-pay

## 2021-04-06 ENCOUNTER — Other Ambulatory Visit (HOSPITAL_COMMUNITY): Payer: Self-pay

## 2021-04-17 ENCOUNTER — Inpatient Hospital Stay: Payer: Medicare PPO | Attending: Internal Medicine

## 2021-04-17 ENCOUNTER — Inpatient Hospital Stay: Payer: Medicare PPO | Admitting: Internal Medicine

## 2021-04-17 ENCOUNTER — Encounter: Payer: Self-pay | Admitting: Internal Medicine

## 2021-04-17 ENCOUNTER — Other Ambulatory Visit: Payer: Self-pay

## 2021-04-17 DIAGNOSIS — C3431 Malignant neoplasm of lower lobe, right bronchus or lung: Secondary | ICD-10-CM

## 2021-04-17 DIAGNOSIS — J90 Pleural effusion, not elsewhere classified: Secondary | ICD-10-CM | POA: Diagnosis not present

## 2021-04-17 DIAGNOSIS — Z87442 Personal history of urinary calculi: Secondary | ICD-10-CM | POA: Diagnosis not present

## 2021-04-17 DIAGNOSIS — R569 Unspecified convulsions: Secondary | ICD-10-CM | POA: Diagnosis not present

## 2021-04-17 DIAGNOSIS — I129 Hypertensive chronic kidney disease with stage 1 through stage 4 chronic kidney disease, or unspecified chronic kidney disease: Secondary | ICD-10-CM | POA: Insufficient documentation

## 2021-04-17 DIAGNOSIS — Z85118 Personal history of other malignant neoplasm of bronchus and lung: Secondary | ICD-10-CM | POA: Diagnosis present

## 2021-04-17 DIAGNOSIS — N184 Chronic kidney disease, stage 4 (severe): Secondary | ICD-10-CM | POA: Insufficient documentation

## 2021-04-17 DIAGNOSIS — E1122 Type 2 diabetes mellitus with diabetic chronic kidney disease: Secondary | ICD-10-CM | POA: Diagnosis not present

## 2021-04-17 DIAGNOSIS — Z85028 Personal history of other malignant neoplasm of stomach: Secondary | ICD-10-CM | POA: Insufficient documentation

## 2021-04-17 DIAGNOSIS — R059 Cough, unspecified: Secondary | ICD-10-CM | POA: Insufficient documentation

## 2021-04-17 DIAGNOSIS — N1330 Unspecified hydronephrosis: Secondary | ICD-10-CM | POA: Insufficient documentation

## 2021-04-17 LAB — COMPREHENSIVE METABOLIC PANEL
ALT: 7 U/L (ref 0–44)
AST: 14 U/L — ABNORMAL LOW (ref 15–41)
Albumin: 3.5 g/dL (ref 3.5–5.0)
Alkaline Phosphatase: 61 U/L (ref 38–126)
Anion gap: 6 (ref 5–15)
BUN: 22 mg/dL (ref 8–23)
CO2: 26 mmol/L (ref 22–32)
Calcium: 8.3 mg/dL — ABNORMAL LOW (ref 8.9–10.3)
Chloride: 103 mmol/L (ref 98–111)
Creatinine, Ser: 1.62 mg/dL — ABNORMAL HIGH (ref 0.44–1.00)
GFR, Estimated: 31 mL/min — ABNORMAL LOW (ref >60)
Glucose, Bld: 83 mg/dL (ref 70–99)
Potassium: 4.2 mmol/L (ref 3.5–5.1)
Sodium: 135 mmol/L (ref 135–145)
Total Bilirubin: 0.6 mg/dL (ref 0.3–1.2)
Total Protein: 6.2 g/dL — ABNORMAL LOW (ref 6.5–8.1)

## 2021-04-17 LAB — CBC WITH DIFFERENTIAL/PLATELET
Abs Immature Granulocytes: 0.02 10*3/uL (ref 0.00–0.07)
Basophils Absolute: 0 10*3/uL (ref 0.0–0.1)
Basophils Relative: 0 %
Eosinophils Absolute: 0.1 10*3/uL (ref 0.0–0.5)
Eosinophils Relative: 2 %
HCT: 32 % — ABNORMAL LOW (ref 36.0–46.0)
Hemoglobin: 10.5 g/dL — ABNORMAL LOW (ref 12.0–15.0)
Immature Granulocytes: 0 %
Lymphocytes Relative: 18 %
Lymphs Abs: 0.9 10*3/uL (ref 0.7–4.0)
MCH: 30.3 pg (ref 26.0–34.0)
MCHC: 32.8 g/dL (ref 30.0–36.0)
MCV: 92.5 fL (ref 80.0–100.0)
Monocytes Absolute: 0.3 10*3/uL (ref 0.1–1.0)
Monocytes Relative: 6 %
Neutro Abs: 3.8 10*3/uL (ref 1.7–7.7)
Neutrophils Relative %: 74 %
Platelets: 121 10*3/uL — ABNORMAL LOW (ref 150–400)
RBC: 3.46 MIL/uL — ABNORMAL LOW (ref 3.87–5.11)
RDW: 12.8 % (ref 11.5–15.5)
WBC: 5.2 10*3/uL (ref 4.0–10.5)
nRBC: 0 % (ref 0.0–0.2)

## 2021-04-17 NOTE — Progress Notes (Signed)
Dx with covid 03/31/2021.
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
## 2021-04-17 NOTE — Assessment & Plan Note (Addendum)
#  Stage IV recurrent adenocarcinoma the lung; EGFR mutated. On  osimertinib 80 mg [Feb 6th 2020]. NOV 1st, 2022- Status post right lower lobectomy. Small to moderate right pleural effusion, chronic. Status post distal gastrectomy. Additional postsurgical changes, as above.  No evidence of recurrent or metastatic disease. STABLE  # currently on Osimertinib 80 mg/day; tolerating well.  Continue current therapy.  Will order CT scan chest and pelvis noncontrast.  # Cough- ? COVID [dec 2022; not tested]- recommend robitussin.  If not improved recommend chest x-ray.  Patient with malaise.  # ITP- chronic- platelets> 125- on promacta 25 mg/day-  STABLE.   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   # right sided stroke/ seizures-on asprin; off plavix;; f/u neurology-[place]- STABLE  # CKD- stage IV; Left Hydronephrosis s/p ureteral stent placement/nephrolithiasis-~ GFR 31 today; on K-citrate BID- STABLE.   # DISPOSITION: # follow up in 2 months;  MD;labs- cbc/cmp;CT CAP prior- Dr.B

## 2021-04-25 ENCOUNTER — Other Ambulatory Visit: Payer: Self-pay | Admitting: Pharmacist

## 2021-04-25 DIAGNOSIS — C3431 Malignant neoplasm of lower lobe, right bronchus or lung: Secondary | ICD-10-CM

## 2021-04-25 MED ORDER — OSIMERTINIB MESYLATE 80 MG PO TABS: 80.0000 mg | ORAL_TABLET | Freq: Every day | ORAL | 5 refills | Status: DC

## 2021-04-27 ENCOUNTER — Other Ambulatory Visit: Payer: Self-pay | Admitting: Cardiovascular Disease

## 2021-05-01 ENCOUNTER — Other Ambulatory Visit (HOSPITAL_COMMUNITY): Payer: Self-pay

## 2021-05-05 ENCOUNTER — Other Ambulatory Visit (HOSPITAL_COMMUNITY): Payer: Self-pay

## 2021-05-29 ENCOUNTER — Other Ambulatory Visit (HOSPITAL_COMMUNITY): Payer: Self-pay

## 2021-06-02 ENCOUNTER — Other Ambulatory Visit (HOSPITAL_COMMUNITY): Payer: Self-pay

## 2021-06-05 ENCOUNTER — Other Ambulatory Visit: Payer: Self-pay

## 2021-06-05 ENCOUNTER — Ambulatory Visit
Admission: RE | Admit: 2021-06-05 | Discharge: 2021-06-05 | Disposition: A | Discharge status: Home / Self Care | Payer: Medicare PPO | Source: Ambulatory Visit | Attending: Internal Medicine | Admitting: Internal Medicine

## 2021-06-05 DIAGNOSIS — C3431 Malignant neoplasm of lower lobe, right bronchus or lung: Secondary | ICD-10-CM | POA: Insufficient documentation

## 2021-06-06 ENCOUNTER — Other Ambulatory Visit (HOSPITAL_COMMUNITY): Payer: Self-pay

## 2021-06-06 ENCOUNTER — Other Ambulatory Visit: Payer: Self-pay | Admitting: Internal Medicine

## 2021-06-06 NOTE — Telephone Encounter (Signed)
°  Component Ref Range & Units 1 mo ago 3 mo ago 5 mo ago 7 mo ago 9 mo ago 11 mo ago 1 yr ago  Potassium 3.5 - 5.1 mmol/L 4.2  4.2  4.1  4.5  4.2  4.7  4.8

## 2021-06-19 ENCOUNTER — Encounter: Payer: Self-pay | Admitting: Internal Medicine

## 2021-06-19 ENCOUNTER — Inpatient Hospital Stay (HOSPITAL_BASED_OUTPATIENT_CLINIC_OR_DEPARTMENT_OTHER): Payer: Medicare PPO | Admitting: Internal Medicine

## 2021-06-19 ENCOUNTER — Other Ambulatory Visit: Payer: Self-pay

## 2021-06-19 ENCOUNTER — Inpatient Hospital Stay: Payer: Medicare PPO | Attending: Internal Medicine

## 2021-06-19 VITALS — BP 136/52 | HR 60 | Temp 98.7°F | Resp 19 | Wt 108.6 lb

## 2021-06-19 DIAGNOSIS — Z85828 Personal history of other malignant neoplasm of skin: Secondary | ICD-10-CM | POA: Insufficient documentation

## 2021-06-19 DIAGNOSIS — Z7982 Long term (current) use of aspirin: Secondary | ICD-10-CM | POA: Insufficient documentation

## 2021-06-19 DIAGNOSIS — M25551 Pain in right hip: Secondary | ICD-10-CM | POA: Insufficient documentation

## 2021-06-19 DIAGNOSIS — Z79899 Other long term (current) drug therapy: Secondary | ICD-10-CM | POA: Insufficient documentation

## 2021-06-19 DIAGNOSIS — I2510 Atherosclerotic heart disease of native coronary artery without angina pectoris: Secondary | ICD-10-CM | POA: Insufficient documentation

## 2021-06-19 DIAGNOSIS — K2940 Chronic atrophic gastritis without bleeding: Secondary | ICD-10-CM | POA: Diagnosis not present

## 2021-06-19 DIAGNOSIS — M5136 Other intervertebral disc degeneration, lumbar region: Secondary | ICD-10-CM | POA: Diagnosis not present

## 2021-06-19 DIAGNOSIS — Z85028 Personal history of other malignant neoplasm of stomach: Secondary | ICD-10-CM | POA: Insufficient documentation

## 2021-06-19 DIAGNOSIS — N1330 Unspecified hydronephrosis: Secondary | ICD-10-CM | POA: Insufficient documentation

## 2021-06-19 DIAGNOSIS — N184 Chronic kidney disease, stage 4 (severe): Secondary | ICD-10-CM | POA: Diagnosis not present

## 2021-06-19 DIAGNOSIS — E119 Type 2 diabetes mellitus without complications: Secondary | ICD-10-CM | POA: Diagnosis not present

## 2021-06-19 DIAGNOSIS — E1151 Type 2 diabetes mellitus with diabetic peripheral angiopathy without gangrene: Secondary | ICD-10-CM | POA: Diagnosis not present

## 2021-06-19 DIAGNOSIS — Z87442 Personal history of urinary calculi: Secondary | ICD-10-CM | POA: Diagnosis not present

## 2021-06-19 DIAGNOSIS — D693 Immune thrombocytopenic purpura: Secondary | ICD-10-CM | POA: Insufficient documentation

## 2021-06-19 DIAGNOSIS — J45909 Unspecified asthma, uncomplicated: Secondary | ICD-10-CM | POA: Insufficient documentation

## 2021-06-19 DIAGNOSIS — Z86010 Personal history of colonic polyps: Secondary | ICD-10-CM | POA: Diagnosis not present

## 2021-06-19 DIAGNOSIS — C3431 Malignant neoplasm of lower lobe, right bronchus or lung: Secondary | ICD-10-CM

## 2021-06-19 DIAGNOSIS — I129 Hypertensive chronic kidney disease with stage 1 through stage 4 chronic kidney disease, or unspecified chronic kidney disease: Secondary | ICD-10-CM | POA: Insufficient documentation

## 2021-06-19 DIAGNOSIS — K219 Gastro-esophageal reflux disease without esophagitis: Secondary | ICD-10-CM | POA: Diagnosis not present

## 2021-06-19 DIAGNOSIS — E538 Deficiency of other specified B group vitamins: Secondary | ICD-10-CM | POA: Diagnosis not present

## 2021-06-19 DIAGNOSIS — Z8673 Personal history of transient ischemic attack (TIA), and cerebral infarction without residual deficits: Secondary | ICD-10-CM | POA: Diagnosis not present

## 2021-06-19 DIAGNOSIS — I700 Atherosclerosis of aorta: Secondary | ICD-10-CM | POA: Diagnosis not present

## 2021-06-19 DIAGNOSIS — E1122 Type 2 diabetes mellitus with diabetic chronic kidney disease: Secondary | ICD-10-CM | POA: Insufficient documentation

## 2021-06-19 LAB — CBC WITH DIFFERENTIAL/PLATELET
Abs Immature Granulocytes: 0.02 10*3/uL (ref 0.00–0.07)
Basophils Absolute: 0 10*3/uL (ref 0.0–0.1)
Basophils Relative: 0 %
Eosinophils Absolute: 0 10*3/uL (ref 0.0–0.5)
Eosinophils Relative: 0 %
HCT: 34.6 % — ABNORMAL LOW (ref 36.0–46.0)
Hemoglobin: 11.5 g/dL — ABNORMAL LOW (ref 12.0–15.0)
Immature Granulocytes: 0 %
Lymphocytes Relative: 9 %
Lymphs Abs: 0.7 10*3/uL (ref 0.7–4.0)
MCH: 30.2 pg (ref 26.0–34.0)
MCHC: 33.2 g/dL (ref 30.0–36.0)
MCV: 90.8 fL (ref 80.0–100.0)
Monocytes Absolute: 0.2 10*3/uL (ref 0.1–1.0)
Monocytes Relative: 2 %
Neutro Abs: 6.1 10*3/uL (ref 1.7–7.7)
Neutrophils Relative %: 89 %
Platelets: 159 10*3/uL (ref 150–400)
RBC: 3.81 MIL/uL — ABNORMAL LOW (ref 3.87–5.11)
RDW: 12.9 % (ref 11.5–15.5)
WBC: 7 10*3/uL (ref 4.0–10.5)
nRBC: 0 % (ref 0.0–0.2)

## 2021-06-19 LAB — COMPREHENSIVE METABOLIC PANEL
ALT: 6 U/L (ref 0–44)
AST: 15 U/L (ref 15–41)
Albumin: 4.1 g/dL (ref 3.5–5.0)
Alkaline Phosphatase: 62 U/L (ref 38–126)
Anion gap: 7 (ref 5–15)
BUN: 37 mg/dL — ABNORMAL HIGH (ref 8–23)
CO2: 26 mmol/L (ref 22–32)
Calcium: 9 mg/dL (ref 8.9–10.3)
Chloride: 98 mmol/L (ref 98–111)
Creatinine, Ser: 1.87 mg/dL — ABNORMAL HIGH (ref 0.44–1.00)
GFR, Estimated: 26 mL/min — ABNORMAL LOW (ref >60)
Glucose, Bld: 147 mg/dL — ABNORMAL HIGH (ref 70–99)
Potassium: 4.4 mmol/L (ref 3.5–5.1)
Sodium: 131 mmol/L — ABNORMAL LOW (ref 135–145)
Total Bilirubin: 0.1 mg/dL — ABNORMAL LOW (ref 0.3–1.2)
Total Protein: 7.2 g/dL (ref 6.5–8.1)
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
## 2021-06-19 NOTE — Assessment & Plan Note (Addendum)
#  Stage IV recurrent adenocarcinoma the lung; EGFR mutated. On osimertinib 80 mg [Feb 6th 2020]. March  11th, 2023-  No evidence of FDG avid local tumor recurrence or metastatic disease, status post right lower lobectomy;  Stable small right pleural effusion.  ?? ?# currently on Osimertinib 80 mg/day; tolerating well.  Continue current therapy.   ? ?# Toe nails- onychomycosis/from osimertinib- awaiting re-avaluation with Podiatry [Dr.Cline] ? ?# Sciatic- Right LE- awaiting steroids injection; okay from oncology standpoint with prednisone/steroid injection. ? ?# ITP- chronic- platelets> 159 on promacta 25 mg/day-  STABLE.  ? ?# Stomach cancer stage I status post gastrectomy; June 2022- S/p EGD- [facility].-  STABLE.  ? ?# right sided stroke/ seizures-on asprin; off plavix;; f/u neurology-[place]- STABLE ? ?# CKD- stage IV; Left Hydronephrosis s/p ureteral stent placement/nephrolithiasis-~ GFR 26 today; on K-citrate BID- STABLE.  ? ?#Incidental findings on Imaging  March 13th, 2023: Aortic Atherosclerosis; Coronary artery Calcifications I reviewed/discussed/counseled the patient.  ? ?# DISPOSITION: ?# follow up in 2 months;  MD;labs- cbc/cmp; Dr.B ? ?# I reviewed the blood work- with the patient in detail; also reviewed the imaging independently [as summarized above]; and with the patient in detail.  ? ?

## 2021-06-27 ENCOUNTER — Other Ambulatory Visit (HOSPITAL_COMMUNITY): Payer: Self-pay

## 2021-06-28 ENCOUNTER — Ambulatory Visit (INDEPENDENT_AMBULATORY_CARE_PROVIDER_SITE_OTHER): Payer: Medicare PPO

## 2021-06-28 DIAGNOSIS — I459 Conduction disorder, unspecified: Secondary | ICD-10-CM

## 2021-06-30 ENCOUNTER — Other Ambulatory Visit (HOSPITAL_COMMUNITY): Payer: Self-pay

## 2021-06-30 LAB — CUP PACEART REMOTE DEVICE CHECK
Battery Remaining Longevity: 76 mo
Battery Voltage: 2.99 V
Brady Statistic AS VP Percent: 84.37 %
Brady Statistic AS VS Percent: 0.01 %
Brady Statistic RV Percent Paced: 99.89 %
Date Time Interrogation Session: 20230321134600
Implantable Pulse Generator Implant Date: 20200901
Lead Channel Impedance Value: 480 Ohm
Lead Channel Pacing Threshold Amplitude: 0.625 V
Lead Channel Pacing Threshold Pulse Width: 0.24 ms
Lead Channel Sensing Intrinsic Amplitude: 8.55 mV
Lead Channel Setting Pacing Amplitude: 1.25 V
Lead Channel Setting Pacing Pulse Width: 0.24 ms
Lead Channel Setting Sensing Sensitivity: 2.8 mV

## 2021-07-03 ENCOUNTER — Other Ambulatory Visit (HOSPITAL_COMMUNITY): Payer: Self-pay

## 2021-07-07 ENCOUNTER — Other Ambulatory Visit (HOSPITAL_COMMUNITY): Payer: Self-pay

## 2021-07-12 NOTE — Progress Notes (Signed)
Remote pacemaker transmission.   

## 2021-07-21 ENCOUNTER — Other Ambulatory Visit (HOSPITAL_COMMUNITY): Payer: Self-pay

## 2021-07-25 ENCOUNTER — Other Ambulatory Visit (HOSPITAL_COMMUNITY): Payer: Self-pay

## 2021-07-28 ENCOUNTER — Other Ambulatory Visit: Payer: Self-pay | Admitting: Internal Medicine

## 2021-07-28 ENCOUNTER — Other Ambulatory Visit (HOSPITAL_COMMUNITY): Payer: Self-pay

## 2021-07-28 DIAGNOSIS — C3431 Malignant neoplasm of lower lobe, right bronchus or lung: Secondary | ICD-10-CM

## 2021-07-28 MED ORDER — ELTROMBOPAG OLAMINE 25 MG PO TABS
ORAL_TABLET | ORAL | 6 refills | Status: AC
  Filled 2021-07-28: qty 30, 30d supply, fill #0
  Filled 2021-08-24: qty 30, 30d supply, fill #1
  Filled 2021-09-25: qty 30, 30d supply, fill #2

## 2021-07-28 NOTE — Telephone Encounter (Signed)
CBC with Differential/Platelet ?Order: [id number] ?Status: Final result    ?Visible to patient: Yes (seen)    ?Next appt: 08/14/2021 at 02:15 PM in Oncology ([facility])    ?Dx: Primary malignant neoplasm of right l...    ?0 Result Notes ?          ?Component Ref Range & Units 1 mo ago 3 mo ago 5 mo ago 6 mo ago 8 mo ago 11 mo ago 1 yr ago  ?WBC 4.0 - 10.5 K/uL 7.0  5.2  4.6  4.7  4.4  4.8  4.2   ?RBC 3.87 - 5.11 MIL/uL 3.81 Low   3.46 Low   3.57 Low   3.57 Low   3.58 Low   3.84 Low   3.32 Low    ?Hemoglobin 12.0 - 15.0 g/dL 11.5 Low   10.5 Low   10.9 Low   11.1 Low   11.0 Low   12.0  10.4 Low    ?HCT 36.0 - 46.0 % 34.6 Low   32.0 Low   33.1 Low   33.2 Low   33.5 Low   35.9 Low   31.6 Low    ?MCV 80.0 - 100.0 fL 90.8  92.5  92.7  93.0  93.6  93.5  95.2   ?MCH 26.0 - 34.0 pg 30.2  30.3  30.5  31.1  30.7  31.3  31.3   ?MCHC 30.0 - 36.0 g/dL 33.2  32.8  32.9  33.4  32.8  33.4  32.9   ?RDW 11.5 - 15.5 % 12.9  12.8  12.8  12.7  12.5  12.7  13.2   ?Platelets 150 - 400 K/uL 159  121 Low  CM  125 Low   107 Low  CM  106 Low   123 Low   125 Low    ?nRBC 0.0 - 0.2 % 0.0  0.0  0.0  0.0  0.0  0.0  0.0   ?Neutrophils Relative % % 89  74  69  68  63  60  56   ?Neutro Abs 1.7 - 7.7 K/uL 6.1  3.8  3.1  3.2  2.8  2.9  2.4   ?Lymphocytes Relative % 9  18  22  21  27  26  26    ?Lymphs Abs 0.7 - 4.0 K/uL 0.7  0.9  1.0  1.0  1.2  1.2  1.1   ?Monocytes Relative % 2  6  7  8  7  8  8    ?Monocytes Absolute 0.1 - 1.0 K/uL 0.2  0.3  0.3  0.4  0.3  0.4  0.3   ?Eosinophils Relative % 0  2  2  3  2  5  8    ?Eosinophils Absolute 0.0 - 0.5 K/uL 0.0  0.1  0.1  0.1  0.1  0.2  0.3   ?Basophils Relative % 0  0  0  0  1  1  1    ?Basophils Absolute 0.0 - 0.1 K/uL 0.0  0.0  0.0  0.0  0.0  0.0  0.0   ?Immature Granulocytes % 0  0  0  0  0  0  1   ?Abs Immature Granulocytes 0.00 - 0.07 K/uL 0.02  0.02 CM  0.02 CM  0.01 CM  0.01 CM  0.01 CM  0.02 CM   ?Comment: Performed at [facility], [address], [place], [place] [address]  ?Resulting Agency   [place] [facility]  CLIN LAB [place] [facility] [place] [facility] [place] [facility] [place] [facility]  ?  ? ?  ?  ?Specimen Collected: 06/19/21 14:26 Last Resulted: 06/19/21 14:39  ?  ?  Lab Flowsheet   ? Order Details   ? View Encounter   ? Lab and Collection Details   ? Routing   ? Result History    ?View Encounter Conversation    ?  ?CM=Additional comments    ?  ?Result Care Coordination ? ? ?Patient Communication ? ? Add Comments   Seen Back to Top  ?  ?  ? ?Other Results from 06/19/2021 ? ? Contains abnormal data Comprehensive metabolic panel ?Order: [id number] ?Status: Final result    ?Visible to patient: Yes (seen)    ?Next appt: 08/14/2021 at 02:15 PM in Oncology ([facility])    ?Dx: Primary malignant neoplasm of right l...    ?0 Result Notes ?          ?Component Ref Range & Units 1 mo ago 3 mo ago 5 mo ago 6 mo ago 8 mo ago 11 mo ago 1 yr ago  ?Sodium 135 - 145 mmol/L 131 Low   135  137  137  137  141  135   ?Potassium 3.5 - 5.1 mmol/L 4.4  4.2  4.2  4.1  4.5  4.2  4.7   ?Chloride 98 - 111 mmol/L 98  103  101  102  103  103  101   ?CO2 22 - 32 mmol/L 26  26  28  27  26  26  24    ?Glucose, Bld 70 - 99 mg/dL 147 High   83 CM  90 CM  94 CM  91 CM  89 CM  88 CM   ?Comment: Glucose reference range applies only to samples taken after fasting for at least 8 hours.  ?BUN 8 - 23 mg/dL 37 High   22  29 High   25 High   27 High   26 High   32 High    ?Creatinine, Ser 0.44 - 1.00 mg/dL 1.87 High   1.62 High   1.82 High   1.60 High   1.64 High   1.63 High   1.73 High    ?Calcium 8.9 - 10.3 mg/dL 9.0  8.3 Low   8.8 Low   8.8 Low   9.2  9.2  9.0   ?Total Protein 6.5 - 8.1 g/dL 7.2  6.2 Low   7.2  6.7  6.6  7.1  7.0   ?Albumin 3.5 - 5.0 g/dL 4.1  3.5  4.1  4.0  4.2  4.2  4.1   ?AST 15 - 41 U/L 15  14 Low   18  17  17  18  19    ?ALT 0 - 44 U/L 6  7  13  10  10  10  10    ?Alkaline Phosphatase 38 - 126 U/L 62  61  72  65  63  72  68   ?Total Bilirubin 0.3 - 1.2 mg/dL 0.1 Low   0.6  0.5  0.2 Low   0.4  0.5  0.6   ?GFR, Estimated >60 mL/min 26  Low   31 Low  CM  27 Low  CM  32 Low  CM  31 Low  CM  31 Low  CM  29 Low  CM   ?Comment: (NOTE)  ?Calculated using the CKD-EPI Creatinine Equation (2021)   ?  Anion gap 5 - 15 7  6  CM  8 CM  8 CM  8 CM  12 CM  10 CM   ?Comment: Performed at [facility], [address] [place], [place] [address]  ?Resulting Agency  [place] [facility] [place] [facility] [place] [facility] [place] [facility] [place] [facility] [place] [facility] [place] [facility]  ?  ? ?  ?  ?Specimen Collected: 06/19/21 14:26 Last Resulted: 06/19/21 15:16  ?  ?  ? ?

## 2021-08-03 ENCOUNTER — Other Ambulatory Visit (HOSPITAL_COMMUNITY): Payer: Self-pay

## 2021-08-04 ENCOUNTER — Telehealth: Payer: Self-pay | Admitting: Pharmacy Technician

## 2021-08-04 ENCOUNTER — Other Ambulatory Visit (HOSPITAL_COMMUNITY): Payer: Self-pay

## 2021-08-07 ENCOUNTER — Other Ambulatory Visit (HOSPITAL_COMMUNITY): Payer: Self-pay

## 2021-08-14 ENCOUNTER — Inpatient Hospital Stay: Payer: Medicare PPO | Admitting: Internal Medicine

## 2021-08-14 ENCOUNTER — Encounter: Payer: Self-pay | Admitting: Internal Medicine

## 2021-08-14 ENCOUNTER — Inpatient Hospital Stay: Payer: Medicare PPO | Attending: Internal Medicine

## 2021-08-14 DIAGNOSIS — C3431 Malignant neoplasm of lower lobe, right bronchus or lung: Secondary | ICD-10-CM | POA: Insufficient documentation

## 2021-08-14 DIAGNOSIS — D693 Immune thrombocytopenic purpura: Secondary | ICD-10-CM | POA: Insufficient documentation

## 2021-08-14 DIAGNOSIS — E1122 Type 2 diabetes mellitus with diabetic chronic kidney disease: Secondary | ICD-10-CM | POA: Diagnosis not present

## 2021-08-14 DIAGNOSIS — I129 Hypertensive chronic kidney disease with stage 1 through stage 4 chronic kidney disease, or unspecified chronic kidney disease: Secondary | ICD-10-CM | POA: Diagnosis not present

## 2021-08-14 DIAGNOSIS — J90 Pleural effusion, not elsewhere classified: Secondary | ICD-10-CM | POA: Insufficient documentation

## 2021-08-14 DIAGNOSIS — N184 Chronic kidney disease, stage 4 (severe): Secondary | ICD-10-CM | POA: Diagnosis not present

## 2021-08-14 DIAGNOSIS — N1330 Unspecified hydronephrosis: Secondary | ICD-10-CM | POA: Diagnosis not present

## 2021-08-14 DIAGNOSIS — Z85028 Personal history of other malignant neoplasm of stomach: Secondary | ICD-10-CM | POA: Insufficient documentation

## 2021-08-14 DIAGNOSIS — Z8673 Personal history of transient ischemic attack (TIA), and cerebral infarction without residual deficits: Secondary | ICD-10-CM | POA: Diagnosis not present

## 2021-08-14 DIAGNOSIS — B351 Tinea unguium: Secondary | ICD-10-CM | POA: Diagnosis not present

## 2021-08-14 DIAGNOSIS — Z87442 Personal history of urinary calculi: Secondary | ICD-10-CM | POA: Diagnosis not present

## 2021-08-14 LAB — CBC WITH DIFFERENTIAL/PLATELET
Abs Immature Granulocytes: 0.01 10*3/uL (ref 0.00–0.07)
Basophils Absolute: 0 10*3/uL (ref 0.0–0.1)
Basophils Relative: 0 %
Eosinophils Absolute: 0.1 10*3/uL (ref 0.0–0.5)
Eosinophils Relative: 2 %
HCT: 33.7 % — ABNORMAL LOW (ref 36.0–46.0)
Hemoglobin: 11.2 g/dL — ABNORMAL LOW (ref 12.0–15.0)
Immature Granulocytes: 0 %
Lymphocytes Relative: 23 %
Lymphs Abs: 1.1 10*3/uL (ref 0.7–4.0)
MCH: 29.7 pg (ref 26.0–34.0)
MCHC: 33.2 g/dL (ref 30.0–36.0)
MCV: 89.4 fL (ref 80.0–100.0)
Monocytes Absolute: 0.3 10*3/uL (ref 0.1–1.0)
Monocytes Relative: 6 %
Neutro Abs: 3.4 10*3/uL (ref 1.7–7.7)
Neutrophils Relative %: 69 %
Platelets: 122 10*3/uL — ABNORMAL LOW (ref 150–400)
RBC: 3.77 MIL/uL — ABNORMAL LOW (ref 3.87–5.11)
RDW: 13.3 % (ref 11.5–15.5)
WBC: 5 10*3/uL (ref 4.0–10.5)
nRBC: 0 % (ref 0.0–0.2)

## 2021-08-14 LAB — COMPREHENSIVE METABOLIC PANEL
ALT: 8 U/L (ref 0–44)
AST: 16 U/L (ref 15–41)
Albumin: 4 g/dL (ref 3.5–5.0)
Alkaline Phosphatase: 71 U/L (ref 38–126)
Anion gap: 9 (ref 5–15)
BUN: 30 mg/dL — ABNORMAL HIGH (ref 8–23)
CO2: 25 mmol/L (ref 22–32)
Calcium: 9.1 mg/dL (ref 8.9–10.3)
Chloride: 99 mmol/L (ref 98–111)
Creatinine, Ser: 1.94 mg/dL — ABNORMAL HIGH (ref 0.44–1.00)
GFR, Estimated: 25 mL/min — ABNORMAL LOW (ref >60)
Glucose, Bld: 83 mg/dL (ref 70–99)
Potassium: 4.5 mmol/L (ref 3.5–5.1)
Sodium: 133 mmol/L — ABNORMAL LOW (ref 135–145)
Total Bilirubin: 0.7 mg/dL (ref 0.3–1.2)
Total Protein: 7 g/dL (ref 6.5–8.1)

## 2021-08-14 NOTE — Assessment & Plan Note (Addendum)
#  Stage IV recurrent adenocarcinoma the lung; EGFR mutated. On osimertinib 80 mg [Feb 6th 2020]. March  11th, 2023-  No evidence of FDG avid local tumor recurrence or metastatic disease, status post right lower lobectomy;  Stable small right pleural effusion.  ?? ?# currently on Osimertinib 80 mg/day; tolerating well.  Continue current therapy; STABLE. Repeat scan in 4-6 months.  ? ?# Toe nails- onychomycosis/from osimertinib- s/p -avaluation with Podiatry [Dr.Cline]-stable. ? ?# Sciatic- Right LE- s/p steroids injection;- improved/stable.  Monitor for now. ? ?# ITP- chronic- platelets> 100 on promacta 25 mg/day-  STABLE.  ? ?# Stomach cancer stage I status post gastrectomy; June 2022- S/p EGD- [facility].-  STABLE.  ? ?# right sided stroke/ seizures-on asprin; off plavix;; f/u neurology-[place]-STABLE.  ? ?# CKD- stage IV; Left Hydronephrosis s/p ureteral stent placement/nephrolithiasis-~ GFR 25 today; on K-citrate BID- STABLE.  ? ?# DISPOSITION: ?# follow up in 2 months;  MD;labs- cbc/cmp; Dr.B ? ? ?

## 2021-08-14 NOTE — Progress Notes (Signed)
Patient denies new problems/concerns today.   °
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
## 2021-08-24 ENCOUNTER — Other Ambulatory Visit (HOSPITAL_COMMUNITY): Payer: Self-pay

## 2021-08-31 ENCOUNTER — Other Ambulatory Visit (HOSPITAL_COMMUNITY): Payer: Self-pay

## 2021-09-06 ENCOUNTER — Ambulatory Visit: Payer: Medicare PPO | Admitting: Urology

## 2021-09-06 ENCOUNTER — Telehealth: Payer: Self-pay | Admitting: Pharmacy Technician

## 2021-09-06 VITALS — BP 146/74 | HR 89 | Ht 62.0 in | Wt 110.0 lb

## 2021-09-06 DIAGNOSIS — N1832 Chronic kidney disease, stage 3b: Secondary | ICD-10-CM

## 2021-09-06 DIAGNOSIS — Z87442 Personal history of urinary calculi: Secondary | ICD-10-CM | POA: Diagnosis not present

## 2021-09-06 DIAGNOSIS — N200 Calculus of kidney: Secondary | ICD-10-CM
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
## 2021-09-20 NOTE — Telephone Encounter (Signed)
Oral Oncology Patient Advocate Encounter   Received notification from [facility] that the existing prior authorization for Promacta is due for renewal.   PA Approval dates: 08/04/21-01/31/22  [place] Patient [place] Phone [phone] Fax [phone]

## 2021-09-25 ENCOUNTER — Other Ambulatory Visit (HOSPITAL_COMMUNITY): Payer: Self-pay

## 2021-09-25 NOTE — Telephone Encounter (Signed)
Oral Oncology Patient Advocate Encounter  Received notification from [facility] that patient has been successfully enrolled into their program to receive Promacta from the manufacturer at $0 out of pocket from 09/06/21-04/08/22.    I called and spoke with patient.  She knows we will have to re-apply.   Specialty Pharmacy that will dispense medication is [facility].  Patient knows to call the office with questions or concerns.   Oral Oncology Clinic will continue to follow.  [place] Patient [place] Phone [phone] Fax [phone]

## 2021-09-27 ENCOUNTER — Ambulatory Visit (INDEPENDENT_AMBULATORY_CARE_PROVIDER_SITE_OTHER): Payer: Medicare PPO

## 2021-09-27 DIAGNOSIS — I459 Conduction disorder, unspecified: Secondary | ICD-10-CM | POA: Diagnosis not present

## 2021-09-29 LAB — CUP PACEART REMOTE DEVICE CHECK
Battery Remaining Longevity: 66 mo
Battery Voltage: 2.98 V
Brady Statistic AS VP Percent: 82.68 %
Brady Statistic AS VS Percent: 0.01 %
Brady Statistic RV Percent Paced: 99.83 %
Date Time Interrogation Session: 20230621071100
Implantable Pulse Generator Implant Date: 20200901
Lead Channel Impedance Value: 440 Ohm
Lead Channel Pacing Threshold Amplitude: 0.75 V
Lead Channel Pacing Threshold Pulse Width: 0.24 ms
Lead Channel Sensing Intrinsic Amplitude: 20.925 mV
Lead Channel Setting Pacing Amplitude: 1.375
Lead Channel Setting Pacing Pulse Width: 0.24 ms
Lead Channel Setting Sensing Sensitivity: 2.8 mV

## 2021-10-03 ENCOUNTER — Emergency Department
Admission: EM | Admit: 2021-10-03 | Discharge: 2021-10-03 | Disposition: A | Discharge status: Home / Self Care | Payer: Medicare PPO | Attending: Emergency Medicine | Admitting: Emergency Medicine

## 2021-10-03 ENCOUNTER — Other Ambulatory Visit: Payer: Self-pay

## 2021-10-03 ENCOUNTER — Emergency Department: Payer: Medicare PPO

## 2021-10-03 DIAGNOSIS — R112 Nausea with vomiting, unspecified: Secondary | ICD-10-CM | POA: Diagnosis not present

## 2021-10-03 DIAGNOSIS — Z85118 Personal history of other malignant neoplasm of bronchus and lung: Secondary | ICD-10-CM | POA: Insufficient documentation

## 2021-10-03 DIAGNOSIS — K529 Noninfective gastroenteritis and colitis, unspecified: Secondary | ICD-10-CM

## 2021-10-03 DIAGNOSIS — Z85028 Personal history of other malignant neoplasm of stomach: Secondary | ICD-10-CM | POA: Insufficient documentation

## 2021-10-03 DIAGNOSIS — R1031 Right lower quadrant pain: Secondary | ICD-10-CM | POA: Insufficient documentation

## 2021-10-03 DIAGNOSIS — R197 Diarrhea, unspecified: Secondary | ICD-10-CM | POA: Insufficient documentation

## 2021-10-03 LAB — CBC WITH DIFFERENTIAL/PLATELET
Abs Immature Granulocytes: 0.02 10*3/uL (ref 0.00–0.07)
Basophils Absolute: 0 10*3/uL (ref 0.0–0.1)
Basophils Relative: 0 %
Eosinophils Absolute: 0 10*3/uL (ref 0.0–0.5)
Eosinophils Relative: 0 %
HCT: 33.1 % — ABNORMAL LOW (ref 36.0–46.0)
Hemoglobin: 10.8 g/dL — ABNORMAL LOW (ref 12.0–15.0)
Immature Granulocytes: 0 %
Lymphocytes Relative: 9 %
Lymphs Abs: 0.5 10*3/uL — ABNORMAL LOW (ref 0.7–4.0)
MCH: 29.1 pg (ref 26.0–34.0)
MCHC: 32.6 g/dL (ref 30.0–36.0)
MCV: 89.2 fL (ref 80.0–100.0)
Monocytes Absolute: 0.4 10*3/uL (ref 0.1–1.0)
Monocytes Relative: 6 %
Neutro Abs: 5.2 10*3/uL (ref 1.7–7.7)
Neutrophils Relative %: 85 %
Platelets: 118 10*3/uL — ABNORMAL LOW (ref 150–400)
RBC: 3.71 MIL/uL — ABNORMAL LOW (ref 3.87–5.11)
RDW: 12.7 % (ref 11.5–15.5)
WBC: 6.1 10*3/uL (ref 4.0–10.5)
nRBC: 0 % (ref 0.0–0.2)

## 2021-10-03 LAB — URINALYSIS, ROUTINE W REFLEX MICROSCOPIC
Bacteria, UA: NONE SEEN
Bilirubin Urine: NEGATIVE
Glucose, UA: 50 mg/dL — AB
Ketones, ur: 5 mg/dL — AB
Leukocytes,Ua: NEGATIVE
Nitrite: NEGATIVE
Protein, ur: 100 mg/dL — AB
Specific Gravity, Urine: 1.035 — ABNORMAL HIGH (ref 1.005–1.030)
pH: 5 (ref 5.0–8.0)

## 2021-10-03 LAB — COMPREHENSIVE METABOLIC PANEL
ALT: 8 U/L (ref 0–44)
AST: 17 U/L (ref 15–41)
Albumin: 4 g/dL (ref 3.5–5.0)
Alkaline Phosphatase: 74 U/L (ref 38–126)
Anion gap: 11 (ref 5–15)
BUN: 20 mg/dL (ref 8–23)
CO2: 22 mmol/L (ref 22–32)
Calcium: 8.9 mg/dL (ref 8.9–10.3)
Chloride: 100 mmol/L (ref 98–111)
Creatinine, Ser: 1.27 mg/dL — ABNORMAL HIGH (ref 0.44–1.00)
GFR, Estimated: 42 mL/min — ABNORMAL LOW (ref >60)
Glucose, Bld: 124 mg/dL — ABNORMAL HIGH (ref 70–99)
Potassium: 4.1 mmol/L (ref 3.5–5.1)
Sodium: 133 mmol/L — ABNORMAL LOW (ref 135–145)
Total Bilirubin: 0.8 mg/dL (ref 0.3–1.2)
Total Protein: 7.2 g/dL (ref 6.5–8.1)

## 2021-10-03 LAB — TROPONIN I (HIGH SENSITIVITY): Troponin I (High Sensitivity): 16 ng/L (ref <18)

## 2021-10-03 LAB — LIPASE, BLOOD: Lipase: 25 U/L (ref 11–51)

## 2021-10-03 MED ORDER — SODIUM CHLORIDE 0.9 % IV BOLUS
500.0000 mL | Freq: Once | INTRAVENOUS | Status: AC
  Administered 2021-10-03: 500 mL via INTRAVENOUS

## 2021-10-03 MED ORDER — ONDANSETRON 4 MG PO TBDP: 4.0000 mg | ORAL_TABLET | Freq: Three times a day (TID) | ORAL | 0 refills | Status: AC | PRN

## 2021-10-03 MED ORDER — ONDANSETRON HCL 4 MG/2ML IJ SOLN
4.0000 mg | Freq: Once | INTRAMUSCULAR | Status: AC
  Administered 2021-10-03: 4 mg via INTRAVENOUS
  Filled 2021-10-03: qty 2

## 2021-10-03 MED ORDER — IOHEXOL 300 MG/ML  SOLN
75.0000 mL | Freq: Once | INTRAMUSCULAR | Status: AC | PRN
  Administered 2021-10-03: 75 mL via INTRAVENOUS

## 2021-10-03 MED ORDER — MORPHINE SULFATE (PF) 4 MG/ML IV SOLN
4.0000 mg | Freq: Once | INTRAVENOUS | Status: AC
  Administered 2021-10-03: 4 mg via INTRAVENOUS
  Filled 2021-10-03: qty 1

## 2021-10-03 NOTE — ED Provider Notes (Signed)
Patient signed out to me at 3 PM pending CT and labs.  She is an 86 year old female presenting with nausea vomiting diarrhea and right lower quadrant pain.  Patient's labs are reassuring creatinine is actually better from prior she has no leukocytosis.  UA is no show any evidence of infection.  CT abdomen pelvis demonstrates chronic right effusion and new left pleural effusion.  Patient has no dyspnea.  She has thickening of the ascending colon likely from a colitis.  There is some fluid around the gallbladder but no other suggestive findings of cholecystitis.  I examined the patient myself she has no tenderness whatsoever in the right upper quadrant and really no tenderness at all on my exam.  She points to the right lower quadrant when asked about the location of her pain.  This correlates with the inflammation seen on CT.  Suspect colitis.  Patient not having any blood in her stool no indication for antibiotics.  We discussed clear liquid diet and slowly advancing as tolerated.  She tolerated p.o. in the ED without difficulty.  Will prescribe Zofran.  Return precautions discussed.   Georga Hacking, MD 10/03/21 1840

## 2021-10-03 NOTE — ED Triage Notes (Signed)
Pt c/o n/v all night with abd pain

## 2021-10-09 NOTE — Progress Notes (Signed)
Remote pacemaker transmission.   

## 2021-10-16 ENCOUNTER — Ambulatory Visit: Payer: Medicare PPO | Admitting: Internal Medicine

## 2021-10-16 ENCOUNTER — Other Ambulatory Visit: Payer: Medicare PPO

## 2021-10-18 ENCOUNTER — Encounter: Payer: Self-pay | Admitting: Internal Medicine

## 2021-10-18 ENCOUNTER — Inpatient Hospital Stay: Payer: Medicare PPO | Attending: Internal Medicine

## 2021-10-18 ENCOUNTER — Telehealth: Payer: Self-pay | Admitting: Pharmacist

## 2021-10-18 ENCOUNTER — Inpatient Hospital Stay: Payer: Medicare PPO | Admitting: Internal Medicine

## 2021-10-18 ENCOUNTER — Other Ambulatory Visit: Payer: Self-pay | Admitting: Pharmacist

## 2021-10-18 DIAGNOSIS — D693 Immune thrombocytopenic purpura: Secondary | ICD-10-CM | POA: Insufficient documentation

## 2021-10-18 DIAGNOSIS — Z79899 Other long term (current) drug therapy: Secondary | ICD-10-CM | POA: Insufficient documentation

## 2021-10-18 DIAGNOSIS — Z8673 Personal history of transient ischemic attack (TIA), and cerebral infarction without residual deficits: Secondary | ICD-10-CM | POA: Diagnosis not present

## 2021-10-18 DIAGNOSIS — E119 Type 2 diabetes mellitus without complications: Secondary | ICD-10-CM | POA: Insufficient documentation

## 2021-10-18 DIAGNOSIS — C160 Malignant neoplasm of cardia: Secondary | ICD-10-CM | POA: Diagnosis not present

## 2021-10-18 DIAGNOSIS — R1031 Right lower quadrant pain: Secondary | ICD-10-CM | POA: Diagnosis not present

## 2021-10-18 DIAGNOSIS — E785 Hyperlipidemia, unspecified: Secondary | ICD-10-CM | POA: Diagnosis not present

## 2021-10-18 DIAGNOSIS — C3431 Malignant neoplasm of lower lobe, right bronchus or lung: Secondary | ICD-10-CM

## 2021-10-18 DIAGNOSIS — I2510 Atherosclerotic heart disease of native coronary artery without angina pectoris: Secondary | ICD-10-CM | POA: Diagnosis not present

## 2021-10-18 DIAGNOSIS — K219 Gastro-esophageal reflux disease without esophagitis: Secondary | ICD-10-CM | POA: Diagnosis not present

## 2021-10-18 DIAGNOSIS — Z86010 Personal history of colonic polyps: Secondary | ICD-10-CM | POA: Diagnosis not present

## 2021-10-18 DIAGNOSIS — Z87442 Personal history of urinary calculi: Secondary | ICD-10-CM | POA: Diagnosis not present

## 2021-10-18 DIAGNOSIS — N179 Acute kidney failure, unspecified: Secondary | ICD-10-CM | POA: Diagnosis not present

## 2021-10-18 DIAGNOSIS — I10 Essential (primary) hypertension: Secondary | ICD-10-CM | POA: Diagnosis not present

## 2021-10-18 DIAGNOSIS — E1122 Type 2 diabetes mellitus with diabetic chronic kidney disease: Secondary | ICD-10-CM | POA: Diagnosis not present

## 2021-10-18 DIAGNOSIS — Z7982 Long term (current) use of aspirin: Secondary | ICD-10-CM | POA: Diagnosis not present

## 2021-10-18 DIAGNOSIS — J90 Pleural effusion, not elsewhere classified: Secondary | ICD-10-CM | POA: Diagnosis not present

## 2021-10-18 DIAGNOSIS — E1151 Type 2 diabetes mellitus with diabetic peripheral angiopathy without gangrene: Secondary | ICD-10-CM | POA: Diagnosis not present

## 2021-10-18 LAB — CBC WITH DIFFERENTIAL/PLATELET
Abs Immature Granulocytes: 0.01 10*3/uL (ref 0.00–0.07)
Basophils Absolute: 0 10*3/uL (ref 0.0–0.1)
Basophils Relative: 1 %
Eosinophils Absolute: 0.1 10*3/uL (ref 0.0–0.5)
Eosinophils Relative: 2 %
HCT: 33.7 % — ABNORMAL LOW (ref 36.0–46.0)
Hemoglobin: 11.1 g/dL — ABNORMAL LOW (ref 12.0–15.0)
Immature Granulocytes: 0 %
Lymphocytes Relative: 22 %
Lymphs Abs: 0.9 10*3/uL (ref 0.7–4.0)
MCH: 29.6 pg (ref 26.0–34.0)
MCHC: 32.9 g/dL (ref 30.0–36.0)
MCV: 89.9 fL (ref 80.0–100.0)
Monocytes Absolute: 0.3 10*3/uL (ref 0.1–1.0)
Monocytes Relative: 6 %
Neutro Abs: 3 10*3/uL (ref 1.7–7.7)
Neutrophils Relative %: 69 %
Platelets: 131 10*3/uL — ABNORMAL LOW (ref 150–400)
RBC: 3.75 MIL/uL — ABNORMAL LOW (ref 3.87–5.11)
RDW: 12.9 % (ref 11.5–15.5)
WBC: 4.4 10*3/uL (ref 4.0–10.5)
nRBC: 0 % (ref 0.0–0.2)

## 2021-10-18 LAB — COMPREHENSIVE METABOLIC PANEL
ALT: 6 U/L (ref 0–44)
AST: 17 U/L (ref 15–41)
Albumin: 4.1 g/dL (ref 3.5–5.0)
Alkaline Phosphatase: 73 U/L (ref 38–126)
Anion gap: 8 (ref 5–15)
BUN: 31 mg/dL — ABNORMAL HIGH (ref 8–23)
CO2: 25 mmol/L (ref 22–32)
Calcium: 8.8 mg/dL — ABNORMAL LOW (ref 8.9–10.3)
Chloride: 100 mmol/L (ref 98–111)
Creatinine, Ser: 1.9 mg/dL — ABNORMAL HIGH (ref 0.44–1.00)
GFR, Estimated: 26 mL/min — ABNORMAL LOW (ref >60)
Glucose, Bld: 87 mg/dL (ref 70–99)
Potassium: 4.3 mmol/L (ref 3.5–5.1)
Sodium: 133 mmol/L — ABNORMAL LOW (ref 135–145)
Total Bilirubin: 0.6 mg/dL (ref 0.3–1.2)
Total Protein: 6.9 g/dL (ref 6.5–8.1)

## 2021-10-18 MED ORDER — OSIMERTINIB MESYLATE 80 MG PO TABS: 80.0000 mg | ORAL_TABLET | Freq: Every day | ORAL | 5 refills | Status: DC

## 2021-10-18 NOTE — Progress Notes (Signed)
Pt in for follow up, reports she was recently diagnosed with Colitis. Currently on Augmentin.

## 2021-10-18 NOTE — Progress Notes (Signed)
[place] OFFICE PROGRESS NOTE  Patient Care Team: Adin Hector, MD as PCP - General (Internal Medicine) Burnell Blanks, MD as PCP - Cardiology (Cardiology) [name], MD as PCP - Electrophysiology (Cardiology) Cammie Sickle, MD as Consulting Physician (Internal Medicine) Efrain Sella, MD as Consulting Physician (Gastroenterology)   Cancer Staging  Primary malignant neoplasm of right lower lobe of lung [facility]) Staging form: Lung, AJCC 7th Edition - Clinical: No stage assigned - Unsigned    Oncology History Overview Note  # DEC 2017- Adeno ca [GATA; her 2 Neu-NEG]; signet ring [1.5 x3 mm gastric incisura; Dr.Skulskie]; EUS [Dr.Burnbridge]; no significant abnormality noted;  Reviewed at [facility] also. JAN 2018- PET NED. April 2018- S/p partial gastrectomy [Dr.Sankar]- STAGE I ADENO CA; NO adjuvant therapy.  # STAGE I CARCINOID s/p partial gastrectomy   # May 2018- Chronic Atrophic gastritis- Prevpac   # FEB 2017- ADENOCARCINOMA with Lepidic 80%-20% acinar pattern; pT2a [Stage IB;T-2.3cm; visceral pleural invasion present; pN=0 ]; AUG 2017- CT NED;   # DEC 2019- RECURRENT/STAGE IV ADENO LUNG CA- EGFR MUTATED;# Jan 6th 2020-; START Osidemrtinib;  # AUG 2020- SEVERE AS [s/p TAVR; [place]]-complicated by R sided stroke/ seizures/acute renal failure.   # DEC 2nd 2020- START PROMACTA 25 mg/day [ITP]  # Molecular testing: EFGR mutated T016W [omniseq]  # Palliative: O-1/20   DIAGNOSIS:  #ADENO CA LUNG-STAGE IV #Stomach adeno ca [stage I; dec 2017]  GOALS: pallaitive  CURRENT/MOST RECENT THERAPY - OSIMERTINIB [Jan 6th 2020]      Primary malignant neoplasm of right lower lobe of lung ([place])  12/02/2015 Initial Diagnosis   Primary malignant neoplasm of right lower lobe of lung (HCC)   Adenocarcinoma of gastric cardia (HCC)   INTERVAL HISTORY: Alone.  Ambulating independantly.   Stacy Cook 86 y.o.  female pleasant patient above history  of recurrent/stage IV adenocarcinoma lung/EGFR mutated currently on osimertinib is here for follow-up.  In the interim patient was evaluated in the emergency room for abdominal pain right lower quadrant and also diarrhea/nausea.  CT scan showed probable colitis.  She is currently on antibiotics as per Dr. Caryl Comes.  She feels improved; however symptoms not resolved.  Review of Systems  Constitutional:  Positive for malaise/fatigue. Negative for chills, diaphoresis and fever.  HENT:  Negative for nosebleeds and sore throat.   Eyes:  Negative for double vision.  Respiratory:  Negative for hemoptysis, sputum production and wheezing.   Cardiovascular:  Negative for chest pain, palpitations, orthopnea and leg swelling.  Gastrointestinal:  Negative for blood in stool, constipation, melena, nausea and vomiting.  Genitourinary:  Negative for dysuria, frequency and urgency.  Musculoskeletal:  Positive for back pain and joint pain.  Neurological:  Positive for focal weakness. Negative for dizziness, tingling, weakness and headaches.  Endo/Heme/Allergies:  Bruises/bleeds easily.  Psychiatric/Behavioral:  Negative for depression. The patient is not nervous/anxious and does not have insomnia.       PAST MEDICAL HISTORY :  Past Medical History:  Diagnosis Date   Abdominal pain    Acid reflux 03/24/2015   Adenocarcinoma of gastric cardia ([place]) 04/10/2016   Allergic genetic state    Anemia    Arteriosclerosis of coronary artery 03/24/2015   Arthritis    "back, hands" (10/15/2017)   Asthma    B12 deficiency anemia    Bile reflux gastritis    CAD (coronary artery disease)    Cancer of right lung ([place]) 05/09/2015   Dr. Genevive Bi performed Right lower  lobe lobectomy.    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 08/03/2016   Carotid stenosis 02/07/2016   Cataract cortical, senile    Chest pain    Colon polyp    Degeneration of intervertebral disc of lumbar region 03/11/2014   Diabetes ([place])    Gastric  adenocarcinoma (HCC)    GERD (gastroesophageal reflux disease)    also, history of ulcers   History of kidney stones    History of stroke    HLD (hyperlipidemia) 08/24/2013   Hypertension    Malignant tumor of stomach ([place]) 07/2016   Adenocarcinoma, diffuse, poorly differentiated, signet ring, stage I   Neuritis or radiculitis due to rupture of lumbar intervertebral disc 09/10/2014   Neuroendocrine tumor 03/24/2015   Neuroma    Osteoporosis    Peripheral vascular disease (HCC)    PONV (postoperative nausea and vomiting)    Presence of permanent cardiac pacemaker 12/09/2018   Primary malignant neoplasm of right lower lobe of lung ([place]) 12/02/2015   Renal insufficiency    Renal stone    S/P TAVR (transcatheter aortic valve replacement)    Sciatica    Severe aortic stenosis    Skin cancer    "cut/burned LUE; cut off right eye/nose & cut off chest" (10/15/2017)   Stomach ulcer    Thrombocytopenia ([place])    Type 2 diabetes, diet controlled ([place])    "no RX since stomach OR 07/2016" (10/15/2017)    PAST SURGICAL HISTORY :   Past Surgical History:  Procedure Laterality Date   APPENDECTOMY     CARDIAC CATHETERIZATION  X2 before 10/15/2017   CARDIAC VALVE REPLACEMENT     CATARACT EXTRACTION W/PHACO Left 04/04/2017   Procedure: CATARACT EXTRACTION PHACO AND INTRAOCULAR LENS PLACEMENT ([place]);  Surgeon: Leandrew Koyanagi, MD;  Location: [facility] ORS;  Service: Ophthalmology;  Laterality: Left;  Lot # [id number] H [place] 1:00 Ap 25% CDE 8.54   CATARACT EXTRACTION W/PHACO Right 05/15/2017   Procedure: CATARACT EXTRACTION PHACO AND INTRAOCULAR LENS PLACEMENT (IOC);  Surgeon: Leandrew Koyanagi, MD;  Location: [facility] ORS;  Service: Ophthalmology;  Laterality: Right;  [place] 01:10 AP% 18.3 CDE 12.91 Fluid pack lot # [id number]   COLONOSCOPY     CORONARY ATHERECTOMY N/A 10/15/2017   Procedure: CORONARY ATHERECTOMY;  Surgeon: Burnell Blanks, MD;  Location: [place] CV LAB;  Service: Cardiovascular;   Laterality: N/A;   CORONARY STENT INTERVENTION N/A 10/15/2017   Procedure: CORONARY STENT INTERVENTION;  Surgeon: Burnell Blanks, MD;  Location: [place] CV LAB;  Service: Cardiovascular;  Laterality: N/A;   CYSTOSCOPY/URETEROSCOPY/HOLMIUM LASER/STENT PLACEMENT Left 08/03/2019   Procedure: CYSTOSCOPY/URETEROSCOPY/HOLMIUM LASER/STENT PLACEMENT;  Surgeon: Hollice Espy, MD;  Location: [facility] ORS;  Service: Urology;  Laterality: Left;   ESOPHAGOGASTRODUODENOSCOPY     ESOPHAGOGASTRODUODENOSCOPY (EGD) WITH PROPOFOL N/A 03/27/2016   Procedure: ESOPHAGOGASTRODUODENOSCOPY (EGD) WITH PROPOFOL;  Surgeon: Lollie Sails, MD;  Location: [facility] ENDOSCOPY;  Service: Endoscopy;  Laterality: N/A;   ESOPHAGOGASTRODUODENOSCOPY (EGD) WITH PROPOFOL N/A 05/28/2016   Procedure: ESOPHAGOGASTRODUODENOSCOPY (EGD) WITH PROPOFOL;  Surgeon: Lollie Sails, MD;  Location: [facility] ENDOSCOPY;  Service: Endoscopy;  Laterality: N/A;   ESOPHAGOGASTRODUODENOSCOPY (EGD) WITH PROPOFOL N/A 09/25/2016   Procedure: ESOPHAGOGASTRODUODENOSCOPY (EGD) WITH PROPOFOL;  Surgeon: Christene Lye, MD;  Location: [facility] ENDOSCOPY;  Service: Endoscopy;  Laterality: N/A;   ESOPHAGOGASTRODUODENOSCOPY (EGD) WITH PROPOFOL N/A 12/18/2016   Procedure: ESOPHAGOGASTRODUODENOSCOPY (EGD) WITH PROPOFOL;  Surgeon: Christene Lye, MD;  Location: [facility] ENDOSCOPY;  Service: Endoscopy;  Laterality: N/A;   ESOPHAGOGASTRODUODENOSCOPY (EGD) WITH PROPOFOL N/A  01/23/2017   Procedure: ESOPHAGOGASTRODUODENOSCOPY (EGD) WITH PROPOFOL;  Surgeon: Christene Lye, MD;  Location: [facility] ENDOSCOPY;  Service: Endoscopy;  Laterality: N/A;   ESOPHAGOGASTRODUODENOSCOPY (EGD) WITH PROPOFOL N/A 03/20/2017   Procedure: ESOPHAGOGASTRODUODENOSCOPY (EGD) WITH PROPOFOL;  Surgeon: Christene Lye, MD;  Location: [facility] ENDOSCOPY;  Service: Endoscopy;  Laterality: N/A;   ESOPHAGOGASTRODUODENOSCOPY (EGD) WITH PROPOFOL N/A 09/23/2020   Procedure:  ESOPHAGOGASTRODUODENOSCOPY (EGD) WITH PROPOFOL;  Surgeon: Lesly Rubenstein, MD;  Location: [facility] ENDOSCOPY;  Service: Endoscopy;  Laterality: N/A;  Rea SAYS "AMPICILLIN IS NEEDED"   FRACTURE SURGERY     INSERT / REPLACE / REMOVE PACEMAKER  12/09/2018   PACEMAKER LEADLESS INSERTION N/A 12/09/2018   Procedure: PACEMAKER LEADLESS INSERTION;  Surgeon: [name], MD;  Location: [place] CV LAB;  Service: Cardiovascular;  Laterality: N/A;   PARTIAL GASTRECTOMY N/A 08/03/2016   Hemigastrectomy, [place] I reconstruction Surgeon: Christene Lye, MD;  Location: [facility] ORS;  Service: General;  Laterality: N/A;   RIGHT/LEFT HEART CATH AND CORONARY ANGIOGRAPHY Bilateral 09/19/2017   Procedure: RIGHT/LEFT HEART CATH AND CORONARY ANGIOGRAPHY;  Surgeon: Yolonda Kida, MD;  Location: [place] CV LAB;  Service: Cardiovascular;  Laterality: Bilateral;   SHOULDER ARTHROSCOPY W/ CAPSULAR REPAIR Right    SKIN CANCER EXCISION     "cut/burned LUE; cut off right eye/nose & cut off chest" (10/15/2017)   TEE WITHOUT CARDIOVERSION N/A 12/02/2018   Procedure: TRANSESOPHAGEAL ECHOCARDIOGRAM (TEE);  Surgeon: Burnell Blanks, MD;  Location: [place] CV LAB;  Service: Open Heart Surgery;  Laterality: N/A;   THORACOTOMY Right 05/09/2015   Procedure: THORACOTOMY, RIGHT LOWER LOBECTOMY, BRONCHOSCOPY;  Surgeon: Nestor Lewandowsky, MD;  Location: [facility] ORS;  Service: Thoracic;  Laterality: Right;   TONSILLECTOMY  1944   TRANSCATHETER AORTIC VALVE REPLACEMENT, TRANSFEMORAL N/A 12/02/2018   Procedure: TRANSCATHETER AORTIC VALVE REPLACEMENT, TRANSFEMORAL;  Surgeon: Burnell Blanks, MD;  Location: [place] CV LAB;  Service: Open Heart Surgery;  Laterality: N/A;   TUBAL LIGATION     UPPER GI ENDOSCOPY N/A 08/03/2016   Procedure: UPPER  ENDOSCOPY;  Surgeon: Christene Lye, MD;  Location: [facility] ORS;  Service: General;  Laterality: N/A;   VAGINAL HYSTERECTOMY     WRIST FRACTURE SURGERY Right      FAMILY HISTORY :   Family History  Problem Relation Age of Onset   Aortic aneurysm Mother    Kidney Stones Mother    Hypertension Mother    Hypertension Father    Diabetes Other    Breast cancer Neg Hx    Prostate cancer Neg Hx    Bladder Cancer Neg Hx    Kidney cancer Neg Hx     SOCIAL HISTORY:   Social History   Tobacco Use   Smoking status: Never   Smokeless tobacco: Never  Vaping Use   Vaping Use: Never used  Substance Use Topics   Alcohol use: Not Currently   Drug use: Never    ALLERGIES:  is allergic to mirtazapine, pneumococcal vaccine, statins, and sulfa antibiotics.  MEDICATIONS:  Current Outpatient Medications  Medication Sig Dispense Refill   amoxicillin-clavulanate (AUGMENTIN) 500-125 MG tablet Take 1 tablet by mouth 2 (two) times daily.     aspirin EC 81 MG tablet Take 81 mg by mouth daily.     cholecalciferol (VITAMIN D) 1000 UNITS tablet Take 1,000 Units by mouth daily.     cyanocobalamin 1000 MCG tablet Take 1,000 mcg by mouth daily.      eltrombopag (PROMACTA) 25 MG tablet TAKE  1 TABLET BY MOUTH DAILY. TAKE ON AN EMPTY STOMACH, 1 HOUR BEFORE A MEAL OR 2 HOURS AFTER. 30 tablet 6   ezetimibe (ZETIA) 10 MG tablet Take 10 mg by mouth daily.     isosorbide mononitrate (IMDUR) 30 MG 24 hr tablet Take 1 tablet by mouth once daily 90 tablet 3   Magnesium 100 MG TABS Take by mouth.     metoprolol succinate (TOPROL-XL) 50 MG 24 hr tablet Take 1 tablet (50 mg total) by mouth every evening. Take with or immediately following a meal. 30 tablet 0   osimertinib mesylate (TAGRISSO) 80 MG tablet Take 1 tablet (80 mg total) by mouth daily. 30 tablet 5   potassium citrate (UROCIT-K) 10 MEQ (1080 MG) SR tablet TAKE 1 TABLET BY MOUTH TWICE DAILY WITH  BREAKFAST  AND  LUNCH (Patient not taking: Reported on 10/18/2021) 90 tablet 0   No current facility-administered medications for this visit.    PHYSICAL EXAMINATION: ECOG PERFORMANCE STATUS: 0 - Asymptomatic  BP (!)  143/63 (BP Location: Left Arm, Patient Position: Sitting)   Pulse 73   Temp (!) 97 F (36.1 C) (Tympanic)   Resp 16   Wt 104 lb (47.2 kg)   SpO2 98%   BMI 19.65 kg/m   Filed Weights   10/18/21 1444  Weight: 104 lb (47.2 kg)   Physical Exam Constitutional:      Comments: Patient walking by herself; no assistive devices.  Alone.  HENT:     Head: Normocephalic and atraumatic.     Mouth/Throat:     Pharynx: No oropharyngeal exudate.  Eyes:     Pupils: Pupils are equal, round, and reactive to light.  Cardiovascular:     Rate and Rhythm: Normal rate and regular rhythm.     Heart sounds: Murmur heard.  Pulmonary:     Effort: No respiratory distress.     Breath sounds: No wheezing.  Abdominal:     General: Bowel sounds are normal. There is no distension.     Palpations: Abdomen is soft. There is no mass.     Tenderness: There is no abdominal tenderness. There is no guarding or rebound.  Musculoskeletal:        General: No tenderness. Normal range of motion.     Cervical back: Normal range of motion and neck supple.  Skin:    General: Skin is warm.  Neurological:     Mental Status: She is alert and oriented to person, place, and time.     Comments: Weakness of the right upper extremity more than lower extremity.  Psychiatric:        Mood and Affect: Affect normal.    LABORATORY DATA:  I have reviewed the data as listed    Component Value Date/Time   NA 133 (L) 10/18/2021 1418   NA 144 01/24/2018 0847   NA 141 07/21/2012 1529   K 4.3 10/18/2021 1418   K 3.9 07/21/2012 1529   CL 100 10/18/2021 1418   CL 109 (H) 07/21/2012 1529   CO2 25 10/18/2021 1418   CO2 27 07/21/2012 1529   GLUCOSE 87 10/18/2021 1418   GLUCOSE 104 (H) 07/21/2012 1529   BUN 31 (H) 10/18/2021 1418   BUN 24 01/24/2018 0847   BUN 16 07/21/2012 1529   CREATININE 1.90 (H) 10/18/2021 1418   CREATININE 1.11 09/03/2012 1535   CALCIUM 8.8 (L) 10/18/2021 1418   CALCIUM 8.9 07/21/2012 1529   PROT 6.9  10/18/2021 1418   PROT  7.1 07/21/2012 1529   ALBUMIN 4.1 10/18/2021 1418   ALBUMIN 3.9 07/21/2012 1529   AST 17 10/18/2021 1418   AST 26 07/21/2012 1529   ALT 6 10/18/2021 1418   ALT 22 07/21/2012 1529   ALKPHOS 73 10/18/2021 1418   ALKPHOS 76 07/21/2012 1529   BILITOT 0.6 10/18/2021 1418   BILITOT 0.3 07/21/2012 1529   GFRNONAA 26 (L) 10/18/2021 1418   GFRNONAA 49 (L) 09/03/2012 1535   GFRAA 29 (L) 01/05/2020 1440   GFRAA 57 (L) 09/03/2012 1535    No results found for: "SPEP", "UPEP"  Lab Results  Component Value Date   WBC 4.4 10/18/2021   NEUTROABS 3.0 10/18/2021   HGB 11.1 (L) 10/18/2021   HCT 33.7 (L) 10/18/2021   MCV 89.9 10/18/2021   PLT 131 (L) 10/18/2021      Chemistry      Component Value Date/Time   NA 133 (L) 10/18/2021 1418   NA 144 01/24/2018 0847   NA 141 07/21/2012 1529   K 4.3 10/18/2021 1418   K 3.9 07/21/2012 1529   CL 100 10/18/2021 1418   CL 109 (H) 07/21/2012 1529   CO2 25 10/18/2021 1418   CO2 27 07/21/2012 1529   BUN 31 (H) 10/18/2021 1418   BUN 24 01/24/2018 0847   BUN 16 07/21/2012 1529   CREATININE 1.90 (H) 10/18/2021 1418   CREATININE 1.11 09/03/2012 1535      Component Value Date/Time   CALCIUM 8.8 (L) 10/18/2021 1418   CALCIUM 8.9 07/21/2012 1529   ALKPHOS 73 10/18/2021 1418   ALKPHOS 76 07/21/2012 1529   AST 17 10/18/2021 1418   AST 26 07/21/2012 1529   ALT 6 10/18/2021 1418   ALT 22 07/21/2012 1529   BILITOT 0.6 10/18/2021 1418   BILITOT 0.3 07/21/2012 1529       RADIOGRAPHIC STUDIES: I have personally reviewed the radiological images as listed and agreed with the findings in the report. No results found.   ASSESSMENT & PLAN:  Primary malignant neoplasm of right lower lobe of lung ([place]) # Stage IV recurrent adenocarcinoma the lung; EGFR mutated. On osimertinib 80 mg [Feb 6th 2020]. March  11th, 2023-  No evidence of FDG avid local tumor recurrence or metastatic disease, status post right lower lobectomy;  Stable  small right pleural effusion [see below]   # currently on Osimertinib 80 mg/day; tolerating well.  Continue current therapy;  Repeat scan in 4-6 months. STABLE.  Discussed with Ebony Hail.  #CT csan JUNE 2023- ER-Increasing moderate to large effusion on the right/however chronic; small left-sided effusion-clinically not suggestive of any progressive disease.  Monitor for now  # RIGHT LOWER quadrant pain- ? Colitis- JUNE 2023-CT scan.  Currently on antibiotics-Per PCP improved not resolved.  Awaiting possible evaluation with GI.  Clinically less likely from [place] as patient has been on [place] for at least 3 and half years now.  # ITP- chronic- platelets> 100 on promacta 25 mg/day-  STABLE.   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  # right sided stroke/ seizures-on asprin; off plavix;; f/u neurology-[place]-STABLE.   # CKD- stage IV; Left Hydronephrosis s/p ureteral stent placement/nephrolithiasis-~ GFR 25 today; on K-citrate BID- STABLE.   # DISPOSITION: # follow up in 2 months;  MD;labs- cbc/cmp; Dr.B     Orders Placed This Encounter  Procedures   CBC with Differential/Platelet    Standing Status:   Future    Standing Expiration  Date:   10/19/2022   Comprehensive metabolic panel    Standing Status:   Future    Standing Expiration Date:   10/19/2022   All questions were answered. The patient knows to call the clinic with any problems, questions or concerns.      Cammie Sickle, MD 10/18/2021 4:02 PM

## 2021-10-18 NOTE — Assessment & Plan Note (Addendum)
#  Stage IV recurrent adenocarcinoma the lung; EGFR mutated. On osimertinib 80 mg [Feb 6th 2020]. March  11th, 2023-  No evidence of FDG avid local tumor recurrence or metastatic disease, status post right lower lobectomy;  Stable small right pleural effusion [see below]  # currently on Osimertinib 80 mg/day; tolerating well.  Continue current therapy;  Repeat scan in 4-6 months. STABLE.  Discussed with Ebony Hail.  #CT csan JUNE 2023- ER-Increasing moderate to large effusion on the right/however chronic; small left-sided effusion-clinically not suggestive of any progressive disease.  Monitor for now  # RIGHT LOWER quadrant pain- ? Colitis- JUNE 2023-CT scan.  Currently on antibiotics-Per PCP improved not resolved.  Awaiting possible evaluation with GI.  Clinically less likely from [place] as patient has been on [place] for at least 3 and half years now.  # ITP- chronic- platelets> 100 on promacta 25 mg/day-  STABLE.   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  # right sided stroke/ seizures-on asprin; off plavix;; f/u neurology-[place]-STABLE.   # CKD- stage IV; Left Hydronephrosis s/p ureteral stent placement/nephrolithiasis-~ GFR 25 today; on K-citrate BID- STABLE.   # DISPOSITION: # follow up in 2 months;  MD;labs- cbc/cmp; Dr.B

## 2021-10-18 NOTE — Telephone Encounter (Signed)
Oral Chemotherapy Pharmacist Encounter   Called today to verify patient's approval status with [employer] assistance program for her free Tagrisso. She is enrolled in the assistance program until 04/08/22   Darl Pikes, PharmD, BCPS, BCOP, CPP Hematology/Oncology Clinical Pharmacist [place]/DB/AP [facility] [phone]  10/18/2021 3:44 PM

## 2021-12-07 ENCOUNTER — Other Ambulatory Visit (HOSPITAL_COMMUNITY): Payer: Self-pay

## 2021-12-19 ENCOUNTER — Inpatient Hospital Stay: Payer: Medicare PPO | Admitting: Internal Medicine

## 2021-12-19 ENCOUNTER — Encounter: Payer: Self-pay | Admitting: Internal Medicine

## 2021-12-19 ENCOUNTER — Inpatient Hospital Stay: Payer: Medicare PPO | Attending: Internal Medicine

## 2021-12-19 ENCOUNTER — Ambulatory Visit
Admission: RE | Admit: 2021-12-19 | Discharge: 2021-12-19 | Disposition: A | Discharge status: Home / Self Care | Payer: Medicare PPO | Source: Ambulatory Visit | Attending: Internal Medicine | Admitting: Internal Medicine

## 2021-12-19 ENCOUNTER — Other Ambulatory Visit: Payer: Self-pay

## 2021-12-19 DIAGNOSIS — Z87442 Personal history of urinary calculi: Secondary | ICD-10-CM | POA: Insufficient documentation

## 2021-12-19 DIAGNOSIS — Z7982 Long term (current) use of aspirin: Secondary | ICD-10-CM | POA: Insufficient documentation

## 2021-12-19 DIAGNOSIS — I129 Hypertensive chronic kidney disease with stage 1 through stage 4 chronic kidney disease, or unspecified chronic kidney disease: Secondary | ICD-10-CM | POA: Diagnosis not present

## 2021-12-19 DIAGNOSIS — C160 Malignant neoplasm of cardia: Secondary | ICD-10-CM | POA: Diagnosis not present

## 2021-12-19 DIAGNOSIS — C3431 Malignant neoplasm of lower lobe, right bronchus or lung: Secondary | ICD-10-CM | POA: Insufficient documentation

## 2021-12-19 DIAGNOSIS — Z79899 Other long term (current) drug therapy: Secondary | ICD-10-CM | POA: Diagnosis not present

## 2021-12-19 DIAGNOSIS — Z85828 Personal history of other malignant neoplasm of skin: Secondary | ICD-10-CM | POA: Insufficient documentation

## 2021-12-19 DIAGNOSIS — Z8719 Personal history of other diseases of the digestive system: Secondary | ICD-10-CM | POA: Insufficient documentation

## 2021-12-19 DIAGNOSIS — E785 Hyperlipidemia, unspecified: Secondary | ICD-10-CM | POA: Insufficient documentation

## 2021-12-19 DIAGNOSIS — E1151 Type 2 diabetes mellitus with diabetic peripheral angiopathy without gangrene: Secondary | ICD-10-CM | POA: Diagnosis not present

## 2021-12-19 DIAGNOSIS — Z8673 Personal history of transient ischemic attack (TIA), and cerebral infarction without residual deficits: Secondary | ICD-10-CM | POA: Diagnosis not present

## 2021-12-19 DIAGNOSIS — D693 Immune thrombocytopenic purpura: Secondary | ICD-10-CM | POA: Diagnosis not present

## 2021-12-19 DIAGNOSIS — K219 Gastro-esophageal reflux disease without esophagitis: Secondary | ICD-10-CM | POA: Diagnosis not present

## 2021-12-19 DIAGNOSIS — E1122 Type 2 diabetes mellitus with diabetic chronic kidney disease: Secondary | ICD-10-CM | POA: Insufficient documentation

## 2021-12-19 DIAGNOSIS — I2510 Atherosclerotic heart disease of native coronary artery without angina pectoris: Secondary | ICD-10-CM | POA: Insufficient documentation

## 2021-12-19 LAB — CBC WITH DIFFERENTIAL/PLATELET
Abs Immature Granulocytes: 0.01 10*3/uL (ref 0.00–0.07)
Basophils Absolute: 0 10*3/uL (ref 0.0–0.1)
Basophils Relative: 0 %
Eosinophils Absolute: 0.1 10*3/uL (ref 0.0–0.5)
Eosinophils Relative: 2 %
HCT: 29.2 % — ABNORMAL LOW (ref 36.0–46.0)
Hemoglobin: 9.5 g/dL — ABNORMAL LOW (ref 12.0–15.0)
Immature Granulocytes: 0 %
Lymphocytes Relative: 23 %
Lymphs Abs: 0.8 10*3/uL (ref 0.7–4.0)
MCH: 29.1 pg (ref 26.0–34.0)
MCHC: 32.5 g/dL (ref 30.0–36.0)
MCV: 89.3 fL (ref 80.0–100.0)
Monocytes Absolute: 0.3 10*3/uL (ref 0.1–1.0)
Monocytes Relative: 8 %
Neutro Abs: 2.3 10*3/uL (ref 1.7–7.7)
Neutrophils Relative %: 67 %
Platelets: 118 10*3/uL — ABNORMAL LOW (ref 150–400)
RBC: 3.27 MIL/uL — ABNORMAL LOW (ref 3.87–5.11)
RDW: 13.5 % (ref 11.5–15.5)
WBC: 3.4 10*3/uL — ABNORMAL LOW (ref 4.0–10.5)
nRBC: 0 % (ref 0.0–0.2)

## 2021-12-19 LAB — IRON AND TIBC
Iron: 47 ug/dL (ref 28–170)
Saturation Ratios: 17 % (ref 10.4–31.8)
TIBC: 286 ug/dL (ref 250–450)
UIBC: 239 ug/dL

## 2021-12-19 LAB — FERRITIN: Ferritin: 44 ng/mL (ref 11–307)

## 2021-12-19 LAB — COMPREHENSIVE METABOLIC PANEL
ALT: 8 U/L (ref 0–44)
AST: 16 U/L (ref 15–41)
Albumin: 3.5 g/dL (ref 3.5–5.0)
Alkaline Phosphatase: 68 U/L (ref 38–126)
Anion gap: 5 (ref 5–15)
BUN: 22 mg/dL (ref 8–23)
CO2: 25 mmol/L (ref 22–32)
Calcium: 8.5 mg/dL — ABNORMAL LOW (ref 8.9–10.3)
Chloride: 108 mmol/L (ref 98–111)
Creatinine, Ser: 1.25 mg/dL — ABNORMAL HIGH (ref 0.44–1.00)
GFR, Estimated: 43 mL/min — ABNORMAL LOW (ref >60)
Glucose, Bld: 120 mg/dL — ABNORMAL HIGH (ref 70–99)
Potassium: 3 mmol/L — ABNORMAL LOW (ref 3.5–5.1)
Sodium: 138 mmol/L (ref 135–145)
Total Bilirubin: 0.5 mg/dL (ref 0.3–1.2)
Total Protein: 6.1 g/dL — ABNORMAL LOW (ref 6.5–8.1)

## 2021-12-19 MED ORDER — POTASSIUM CITRATE ER 10 MEQ (1080 MG) PO TBCR: 10.0000 meq | EXTENDED_RELEASE_TABLET | Freq: Every day | ORAL | 0 refills | Status: DC

## 2021-12-19 NOTE — Progress Notes (Signed)
Worsening SOBr on exertion.  Appetite not improving drinks 2-3 Ensure a day and has 2 lb wt gain.
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
## 2021-12-19 NOTE — Patient Instructions (Signed)
#  Recommend gentle iron 1 pill a day; should not upset your stomach or cause constipation.  Talk to the pharmacist if you can find it/it is over-the-counter.  

## 2021-12-19 NOTE — Assessment & Plan Note (Addendum)
#  Stage IV recurrent adenocarcinoma the lung; EGFR mutated. On osimertinib 80 mg [Feb 6th 2020]. JUNE 27th-  2023- ER-Increasing moderate to large effusion on the right/however chronic; small left-sided effusion-clinically not suggestive of any progressive disease.  Monitor for now  # currently on Osimertinib 80 mg/day; tolerating well.  Continue current therapy;  Repeat scan in 2  Months; CT scan ordered-however see below regarding cough.    #Ongoing cough-dry.  Question related to underlying pleural effusion as noted on previous imaging in June 2023 CT scan.  Check chest x-ray.  Patient noted recommend thoracentesis for further evaluation.  # RIGHT LOWER quadrant pain- ? Colitis- JUNE 2023-CT scan.  Currently on antibiotics-Per PCP improved not resolved.  Awaiting possible evaluation with GI.  Clinically less likely from [place] as patient has been on [place] for at least 3 and half years now.  # ITP- chronic- platelets> 100 on promacta 25 mg/day-  STABLE.   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   # right sided stroke/ seizures-on asprin; off plavix;; f/u neurology-[place]-STABLE.    # CKD- stage IV; Left Hydronephrosis s/p ureteral stent placement/nephrolithiasis-~ GFR 25 today; Hypokalemia-3.0 recommend re-starting on  K-citrate q day.   # IV ACCESS: PIV  # DISPOSITION: # Add iron studies/ferritin To labs  # follow up in 2 months;  MD;labs- cbc/cmp; possible IV venofer-  PRIOR- CT scan CAP non- contrast-Dr.B

## 2021-12-21 ENCOUNTER — Other Ambulatory Visit: Payer: Self-pay | Admitting: Internal Medicine

## 2021-12-21 DIAGNOSIS — J90 Pleural effusion, not elsewhere classified: Secondary | ICD-10-CM | POA: Insufficient documentation

## 2021-12-21 NOTE — Progress Notes (Signed)
I spoke to pt re: need for right thoracentesis; and need to move the CT scans ordered sooner- next week.  Please have pt follow up with me in 2 weeks or so. MD; no labs- Dr.B

## 2021-12-21 NOTE — Progress Notes (Signed)
Will send scheduling request for thoracentesis

## 2021-12-21 NOTE — Progress Notes (Signed)
Patient informed she is scheduled for US Thoracentesis Mon 9/18 @10a   Arrive @9 :30a

## 2021-12-25 ENCOUNTER — Ambulatory Visit
Admission: RE | Admit: 2021-12-25 | Discharge: 2021-12-25 | Disposition: A | Discharge status: Home / Self Care | Payer: Medicare PPO | Source: Ambulatory Visit | Attending: Internal Medicine | Admitting: Internal Medicine

## 2021-12-25 ENCOUNTER — Other Ambulatory Visit: Payer: Self-pay | Admitting: Internal Medicine

## 2021-12-25 DIAGNOSIS — J90 Pleural effusion, not elsewhere classified: Secondary | ICD-10-CM

## 2021-12-25 LAB — BODY FLUID CELL COUNT WITH DIFFERENTIAL
Eos, Fluid: 1 %
Lymphs, Fluid: 82 %
Monocyte-Macrophage-Serous Fluid: 12 %
Neutrophil Count, Fluid: 5 %
Total Nucleated Cell Count, Fluid: 111 cu mm

## 2021-12-25 LAB — PROTEIN, PLEURAL OR PERITONEAL FLUID: Total protein, fluid: <3 g/dL

## 2021-12-25 LAB — GLUCOSE, PLEURAL OR PERITONEAL FLUID: Glucose, Fluid: 73 mg/dL

## 2021-12-25 LAB — LACTATE DEHYDROGENASE, PLEURAL OR PERITONEAL FLUID: LD, Fluid: 122 U/L — ABNORMAL HIGH (ref 3–23)

## 2021-12-25 NOTE — Procedures (Signed)
PROCEDURE SUMMARY:  Successful US guided right thoracentesis. Yielded 625 ml of clear yellow fluid. Pt tolerated procedure well. No immediate complications.  Specimen sent for labs. CXR ordered; no post-procedure pneumothorax identified  EBL < 2 mL  Theresa Duty, NP 12/25/2021 10:33 AM

## 2021-12-26 LAB — PROTEIN, BODY FLUID (OTHER): Total Protein, Body Fluid Other: 2.1 g/dL

## 2021-12-27 ENCOUNTER — Ambulatory Visit (INDEPENDENT_AMBULATORY_CARE_PROVIDER_SITE_OTHER): Payer: Medicare PPO

## 2021-12-27 DIAGNOSIS — I459 Conduction disorder, unspecified: Secondary | ICD-10-CM

## 2021-12-27 LAB — CUP PACEART REMOTE DEVICE CHECK
Battery Remaining Longevity: 65 mo
Battery Voltage: 2.97 V
Brady Statistic AS VP Percent: 80.33 %
Brady Statistic AS VS Percent: 0.01 %
Brady Statistic RV Percent Paced: 99.66 %
Date Time Interrogation Session: 20230920073200
Implantable Pulse Generator Implant Date: 20200901
Lead Channel Impedance Value: 440 Ohm
Lead Channel Pacing Threshold Amplitude: 0.875 V
Lead Channel Pacing Threshold Pulse Width: 0.24 ms
Lead Channel Sensing Intrinsic Amplitude: 14.738 mV
Lead Channel Setting Pacing Amplitude: 1.5 V
Lead Channel Setting Pacing Pulse Width: 0.24 ms
Lead Channel Setting Sensing Sensitivity: 2.8 mV

## 2021-12-28 LAB — BODY FLUID CULTURE W GRAM STAIN: Culture: NO GROWTH

## 2021-12-28 LAB — CYTOLOGY - NON PAP

## 2021-12-29 ENCOUNTER — Ambulatory Visit
Admission: RE | Admit: 2021-12-29 | Discharge: 2021-12-29 | Disposition: A | Discharge status: Home / Self Care | Payer: Medicare PPO | Source: Ambulatory Visit | Attending: Internal Medicine | Admitting: Internal Medicine

## 2021-12-29 DIAGNOSIS — C3431 Malignant neoplasm of lower lobe, right bronchus or lung: Secondary | ICD-10-CM | POA: Insufficient documentation

## 2022-01-01 ENCOUNTER — Ambulatory Visit: Payer: Medicare PPO | Admitting: Dermatology

## 2022-01-01 DIAGNOSIS — C44319 Basal cell carcinoma of skin of other parts of face: Secondary | ICD-10-CM

## 2022-01-01 DIAGNOSIS — L578 Other skin changes due to chronic exposure to nonionizing radiation: Secondary | ICD-10-CM

## 2022-01-01 DIAGNOSIS — L821 Other seborrheic keratosis: Secondary | ICD-10-CM

## 2022-01-01 DIAGNOSIS — C4491 Basal cell carcinoma of skin, unspecified: Secondary | ICD-10-CM

## 2022-01-01 DIAGNOSIS — L820 Inflamed seborrheic keratosis: Secondary | ICD-10-CM | POA: Diagnosis not present

## 2022-01-01 DIAGNOSIS — D692 Other nonthrombocytopenic purpura: Secondary | ICD-10-CM

## 2022-01-01 DIAGNOSIS — L814 Other melanin hyperpigmentation: Secondary | ICD-10-CM | POA: Diagnosis not present

## 2022-01-01 DIAGNOSIS — C44310 Basal cell carcinoma of skin of unspecified parts of face: Secondary | ICD-10-CM

## 2022-01-01 DIAGNOSIS — D492 Neoplasm of unspecified behavior of bone, soft tissue, and skin: Secondary | ICD-10-CM

## 2022-01-01 HISTORY — DX: Basal cell carcinoma of skin, unspecified: C44.91

## 2022-01-01 LAB — MISC LABCORP TEST (SEND OUT): Labcorp test code: 9985

## 2022-01-01 NOTE — Patient Instructions (Addendum)
Cryotherapy Aftercare  Wash gently with soap and water everyday.   Apply Vaseline and Band-Aid daily until healed.    Wound Care Instructions  Cleanse wound gently with soap and water once a day then pat dry with clean gauze. Apply a thin coat of Petrolatum (petroleum jelly, "Vaseline") over the wound (unless you have an allergy to this). We recommend that you use a new, sterile tube of Vaseline. Do not pick or remove scabs. Do not remove the yellow or white "healing tissue" from the base of the wound.  Cover the wound with fresh, clean, nonstick gauze and secure with paper tape. You may use Band-Aids in place of gauze and tape if the wound is small enough, but would recommend trimming much of the tape off as there is often too much. Sometimes Band-Aids can irritate the skin.  You should call the office for your biopsy report after 1 week if you have not already been contacted.  If you experience any problems, such as abnormal amounts of bleeding, swelling, significant bruising, significant pain, or evidence of infection, please call the office immediately.  FOR ADULT SURGERY PATIENTS: If you need something for pain relief you may take 1 extra strength Tylenol (acetaminophen) AND 2 Ibuprofen (200mg  each) together every 4 hours as needed for pain. (do not take these if you are allergic to them or if you have a reason you should not take them.) Typically, you may only need pain medication for 1 to 3 days.      Due to recent changes in healthcare laws, you may see results of your pathology and/or laboratory studies on MyChart before the doctors have had a chance to review them. We understand that in some cases there may be results that are confusing or concerning to you. Please understand that not all results are received at the same time and often the doctors may need to interpret multiple results in order to provide you with the best plan of care or course of treatment. Therefore, we ask that you  please give [place] 2 business days to thoroughly review all your results before contacting the office for clarification. Should we see a critical lab result, you will be contacted sooner.   If You Need Anything After Your Visit  If you have any questions or concerns for your doctor, please call our main line at [phone] and press option 4 to reach your doctor's medical assistant. If no one answers, please leave a voicemail as directed and we will return your call as soon as possible. Messages left after 4 pm will be answered the following business day.   You may also send [place] a message via [place]. We typically respond to MyChart messages within 1-2 business days.  For prescription refills, please ask your pharmacy to contact our office. Our fax number is [phone].  If you have an urgent issue when the clinic is closed that cannot wait until the next business day, you can page your doctor at the number below.    Please note that while we do our best to be available for urgent issues outside of office hours, we are not available 24/7.   If you have an urgent issue and are unable to reach [place], you may choose to seek medical care at your doctor's office, retail clinic, urgent care center, or emergency room.  If you have a medical emergency, please immediately call 911 or go to the emergency department.  Pager Numbers  - Dr. Nehemiah Massed: [phone]  -  Dr. Laurence Ferrari: [phone]  - Dr. Nicole Kindred: [phone]  In the event of inclement weather, please call our main line at [phone] for an update on the status of any delays or closures.  Dermatology Medication Tips: Please keep the boxes that topical medications come in in order to help keep track of the instructions about where and how to use these. Pharmacies typically print the medication instructions only on the boxes and not directly on the medication tubes.   If your medication is too expensive, please contact our office at  [phone] option 4 or send [place] a message through [place].   We are unable to tell what your co-pay for medications will be in advance as this is different depending on your insurance coverage. However, we may be able to find a substitute medication at lower cost or fill out paperwork to get insurance to cover a needed medication.   If a prior authorization is required to get your medication covered by your insurance company, please allow [place] 1-2 business days to complete this process.  Drug prices often vary depending on where the prescription is filled and some pharmacies may offer cheaper prices.  The website www.goodrx.com contains coupons for medications through different pharmacies. The prices here do not account for what the cost may be with help from insurance (it may be cheaper with your insurance), but the website can give you the price if you did not use any insurance.  - You can print the associated coupon and take it with your prescription to the pharmacy.  - You may also stop by our office during regular business hours and pick up a GoodRx coupon card.  - If you need your prescription sent electronically to a different pharmacy, notify our office through [facility] or by phone at [phone] option 4.     Si Usted Necesita Algo Despus de Su Visita  Tambin puede enviarnos un mensaje a travs de Pharmacist, community. Por lo general respondemos a los mensajes de MyChart en el transcurso de 1 a 2 das hbiles.  Para renovar recetas, por favor pida a su farmacia que se ponga en contacto con nuestra oficina. Harland Dingwall de fax es [place] [phone].  Si tiene un asunto urgente cuando la clnica est cerrada y que no puede esperar hasta el siguiente da hbil, puede llamar/localizar a su doctor(a) al nmero que aparece a continuacin.   Por favor, tenga en cuenta que aunque hacemos todo lo posible para estar disponibles para asuntos urgentes fuera del horario de [place], no estamos  disponibles las 24 horas del da, los 7 das de la [place].   Si tiene un problema urgente y no puede comunicarse con nosotros, puede optar por buscar atencin mdica  en el consultorio de su doctor(a), en una clnica privada, en un centro de atencin urgente o en una sala de emergencias.  Si tiene Engineering geologist, por favor llame inmediatamente al 911 o vaya a la sala de emergencias.  Nmeros de bper  - Dr. Nehemiah Massed: [phone]  - Dra. Moye: [phone]  - Dra. Nicole Kindred: [phone]  En caso de inclemencias del [place], por favor llame a Johnsie Kindred principal al [phone] para una actualizacin sobre el [place] de cualquier retraso o cierre.  Consejos para la medicacin en dermatologa: Por favor, guarde las cajas en las que vienen los medicamentos de uso tpico para ayudarle a seguir las instrucciones sobre dnde y cmo usarlos. Las farmacias generalmente imprimen las instrucciones del medicamento slo en las cajas y  no directamente en los tubos del medicamento.   Si su medicamento es muy caro, por favor, pngase en contacto con Zigmund Daniel llamando al [phone] y presione la opcin 4 o envenos un mensaje a travs de Pharmacist, community.   No podemos decirle cul ser su copago por los medicamentos por adelantado ya que esto es diferente dependiendo de la cobertura de su seguro. Sin embargo, es posible que podamos encontrar un medicamento sustituto a Electrical engineer un formulario para que el seguro cubra el medicamento que se considera necesario.   Si se requiere una autorizacin previa para que su compaa de seguros [place] su medicamento, por favor permtanos de 1 a 2 das hbiles para completar este proceso.  Los precios de los medicamentos varan con frecuencia dependiendo del Environmental consultant de dnde se surte la receta y alguna farmacias pueden ofrecer precios ms baratos.  El sitio web www.goodrx.com tiene cupones para medicamentos de Airline pilot. Los precios aqu no  tienen en cuenta lo que podra costar con la ayuda del seguro (puede ser ms barato con su seguro), pero el sitio web puede darle el precio si no utiliz Research scientist (physical sciences).  - Puede imprimir el cupn correspondiente y llevarlo con su receta a la farmacia.  - Tambin puede pasar por nuestra oficina durante el horario de atencin regular y Charity fundraiser una tarjeta de cupones de GoodRx.  - Si necesita que su receta se enve electrnicamente a una farmacia diferente, informe a nuestra oficina a travs de MyChart de [place] o por telfono llamando al [phone] y presione la opcin 4.     Due to recent changes in healthcare laws, you may see results of your pathology and/or laboratory studies on MyChart before the doctors have had a chance to review them. We understand that in some cases there may be results that are confusing or concerning to you. Please understand that not all results are received at the same time and often the doctors may need to interpret multiple results in order to provide you with the best plan of care or course of treatment. Therefore, we ask that you please give [place] 2 business days to thoroughly review all your results before contacting the office for clarification. Should we see a critical lab result, you will be contacted sooner.   If You Need Anything After Your Visit  If you have any questions or concerns for your doctor, please call our main line at [phone] and press option 4 to reach your doctor's medical assistant. If no one answers, please leave a voicemail as directed and we will return your call as soon as possible. Messages left after 4 pm will be answered the following business day.   You may also send [place] a message via [place]. We typically respond to MyChart messages within 1-2 business days.  For prescription refills, please ask your pharmacy to contact our office. Our fax number is [phone].  If you have an urgent issue when the clinic is closed that cannot  wait until the next business day, you can page your doctor at the number below.    Please note that while we do our best to be available for urgent issues outside of office hours, we are not available 24/7.   If you have an urgent issue and are unable to reach [place], you may choose to seek medical care at your doctor's office, retail clinic, urgent care center, or emergency room.  If you have a medical emergency, please immediately call 911  or go to the emergency department.  Pager Numbers  - Dr. Nehemiah Massed: [phone]  - Dr. Laurence Ferrari: [phone]  - Dr. Nicole Kindred: [phone]  In the event of inclement weather, please call our main line at [phone] for an update on the status of any delays or closures.  Dermatology Medication Tips: Please keep the boxes that topical medications come in in order to help keep track of the instructions about where and how to use these. Pharmacies typically print the medication instructions only on the boxes and not directly on the medication tubes.   If your medication is too expensive, please contact our office at [phone] option 4 or send [place] a message through [place].   We are unable to tell what your co-pay for medications will be in advance as this is different depending on your insurance coverage. However, we may be able to find a substitute medication at lower cost or fill out paperwork to get insurance to cover a needed medication.   If a prior authorization is required to get your medication covered by your insurance company, please allow [place] 1-2 business days to complete this process.  Drug prices often vary depending on where the prescription is filled and some pharmacies may offer cheaper prices.  The website www.goodrx.com contains coupons for medications through different pharmacies. The prices here do not account for what the cost may be with help from insurance (it may be cheaper with your insurance), but the website can give you the price  if you did not use any insurance.  - You can print the associated coupon and take it with your prescription to the pharmacy.  - You may also stop by our office during regular business hours and pick up a GoodRx coupon card.  - If you need your prescription sent electronically to a different pharmacy, notify our office through [facility] or by phone at [phone] option 4.     Si Usted Necesita Algo Despus de Su Visita  Tambin puede enviarnos un mensaje a travs de Pharmacist, community. Por lo general respondemos a los mensajes de MyChart en el transcurso de 1 a 2 das hbiles.  Para renovar recetas, por favor pida a su farmacia que se ponga en contacto con nuestra oficina. Harland Dingwall de fax es [place] [phone].  Si tiene un asunto urgente cuando la clnica est cerrada y que no puede esperar hasta el siguiente da hbil, puede llamar/localizar a su doctor(a) al nmero que aparece a continuacin.   Por favor, tenga en cuenta que aunque hacemos todo lo posible para estar disponibles para asuntos urgentes fuera del horario de [place], no estamos disponibles las 24 horas del da, los 7 das de la [place].   Si tiene un problema urgente y no puede comunicarse con nosotros, puede optar por buscar atencin mdica  en el consultorio de su doctor(a), en una clnica privada, en un centro de atencin urgente o en una sala de emergencias.  Si tiene Engineering geologist, por favor llame inmediatamente al 911 o vaya a la sala de emergencias.  Nmeros de bper  - Dr. Nehemiah Massed: [phone]  - Dra. Moye: [phone]  - Dra. Nicole Kindred: [phone]  En caso de inclemencias del [place], por favor llame a Johnsie Kindred principal al [phone] para una actualizacin sobre el [place] de cualquier retraso o cierre.  Consejos para la medicacin en dermatologa: Por favor, guarde las cajas en las que vienen los medicamentos de uso tpico para ayudarle a seguir las instrucciones sobre dnde y  cmo usarlos.  Las farmacias generalmente imprimen las instrucciones del medicamento slo en las cajas y no directamente en los tubos del [place].   Si su medicamento es muy caro, por favor, pngase en contacto con Zigmund Daniel llamando al [phone] y presione la opcin 4 o envenos un mensaje a travs de Pharmacist, community.   No podemos decirle cul ser su copago por los medicamentos por adelantado ya que esto es diferente dependiendo de la cobertura de su seguro. Sin embargo, es posible que podamos encontrar un medicamento sustituto a Electrical engineer un formulario para que el seguro cubra el medicamento que se considera necesario.   Si se requiere una autorizacin previa para que su compaa de seguros [place] su medicamento, por favor permtanos de 1 a 2 das hbiles para completar este proceso.  Los precios de los medicamentos varan con frecuencia dependiendo del Environmental consultant de dnde se surte la receta y alguna farmacias pueden ofrecer precios ms baratos.  El sitio web www.goodrx.com tiene cupones para medicamentos de Airline pilot. Los precios aqu no tienen en cuenta lo que podra costar con la ayuda del seguro (puede ser ms barato con su seguro), pero el sitio web puede darle el precio si no utiliz Research scientist (physical sciences).  - Puede imprimir el cupn correspondiente y llevarlo con su receta a la farmacia.  - Tambin puede pasar por nuestra oficina durante el horario de atencin regular y Charity fundraiser una tarjeta de cupones de GoodRx.  - Si necesita que su receta se enve electrnicamente a una farmacia diferente, informe a nuestra oficina a travs de MyChart de [place] o por telfono llamando al [phone] y presione la opcin 4.

## 2022-01-01 NOTE — Progress Notes (Unsigned)
New Patient Visit  Subjective  Stacy Cook is a 86 y.o. female who presents for the following: Skin Problem (The patient has spots on her forehead and right arm to be evaluated. The patient has spots, moles and lesions to be evaluated, some may be new or changing and the patient has concerns that these could be cancer.  The following portions of the chart were reviewed this encounter and updated as appropriate:   Tobacco  Allergies  Meds  Problems  Med Hx  Surg Hx  Fam Hx     Review of Systems:  No other skin or systemic complaints except as noted in HPI or Assessment and Plan.  Objective  Well appearing patient in no apparent distress; mood and affect are within normal limits.  A focused examination was performed including face,arms,back. Relevant physical exam findings are noted in the Assessment and Plan.  right anterior deltoid Stuck-on, waxy, tan-brown papule-- Discussed benign etiology and prognosis.   glabella 1.1 x 0.7 cm pink irregular patch       Assessment & Plan  Inflamed seborrheic keratosis right anterior deltoid  Symptomatic, irritating, patient would like treated.   Destruction of lesion - right anterior deltoid Complexity: simple   Destruction method: cryotherapy   Informed consent: discussed and consent obtained   Timeout:  patient name, date of birth, surgical site, and procedure verified Lesion destroyed using liquid nitrogen: Yes   Region frozen until ice ball extended beyond lesion: Yes   Outcome: patient tolerated procedure well with no complications   Post-procedure details: wound care instructions given    Neoplasm of skin glabella  Epidermal / dermal shaving  Lesion diameter (cm):  1.1 Informed consent: discussed and consent obtained   Timeout: patient name, date of birth, surgical site, and procedure verified   Procedure prep:  Patient was prepped and draped in usual sterile fashion Prep type:  Isopropyl alcohol Anesthesia: the  lesion was anesthetized in a standard fashion   Anesthetic:  1% lidocaine w/ epinephrine 1-100,000 buffered w/ 8.4% NaHCO3 Hemostasis achieved with: pressure, aluminum chloride and electrodesiccation   Outcome: patient tolerated procedure well   Post-procedure details: sterile dressing applied and wound care instructions given   Dressing type: bandage and petrolatum    Destruction of lesion  Destruction method: electrodesiccation and curettage   Informed consent: discussed and consent obtained   Timeout:  patient name, date of birth, surgical site, and procedure verified Anesthesia: the lesion was anesthetized in a standard fashion   Anesthetic:  1% lidocaine w/ epinephrine 1-100,000 buffered w/ 8.4% NaHCO3 Curettage performed in three different directions: Yes   Electrodesiccation performed over the curetted area: Yes   Curettage cycles:  3 Lesion length (cm):  1.1 Lesion width (cm):  1.1 Margin per side (cm):  0.2 Final wound size (cm):  1.5 Hemostasis achieved with:  electrodesiccation Outcome: patient tolerated procedure well with no complications   Post-procedure details: sterile dressing applied and wound care instructions given   Dressing type: petrolatum    Specimen 1 - Surgical pathology Differential Diagnosis: R/O BCC  Check Margins: No  Purpura - Chronic; persistent and recurrent.  Treatable, but not curable. - Violaceous macules and patches - Benign - Related to trauma, age, sun damage and/or use of blood thinners, chronic use of topical and/or oral steroids - Observe - Can use OTC arnica containing moisturizer such as Dermend Bruise Formula if desired - Call for worsening or other concerns   Actinic Damage - chronic, secondary to cumulative  UV radiation exposure/sun exposure over time - diffuse scaly erythematous macules with underlying dyspigmentation - Recommend daily broad spectrum sunscreen SPF 30+ to sun-exposed areas, reapply every 2 hours as needed.  -  Recommend staying in the shade or wearing long sleeves, sun glasses (UVA+UVB protection) and wide brim hats (4-inch brim around the entire circumference of the hat). - Call for new or changing lesions.   Lentigines - Scattered tan macules - Due to sun exposure - Benign-appearing, observe - Recommend daily broad spectrum sunscreen SPF 30+ to sun-exposed areas, reapply every 2 hours as needed. - Call for any changes   Seborrheic Keratoses - Stuck-on, waxy, tan-brown papules and/or plaques  - Benign-appearing - Discussed benign etiology and prognosis. - Observe - Call for any changes  Return in about 4 months (around 05/03/2022) for recheck ISK, .  I, Marye Round, CMA, am acting as scribe for Sarina Ser, MD .  Documentation: I have reviewed the above documentation for accuracy and completeness, and I agree with the above.  Sarina Ser, MD

## 2022-01-02 ENCOUNTER — Encounter: Payer: Self-pay | Admitting: Dermatology

## 2022-01-03 ENCOUNTER — Telehealth: Payer: Self-pay | Admitting: Internal Medicine

## 2022-01-03 ENCOUNTER — Telehealth: Payer: Self-pay

## 2022-01-03 ENCOUNTER — Other Ambulatory Visit: Payer: Self-pay | Admitting: Pharmacist

## 2022-01-03 DIAGNOSIS — C3431 Malignant neoplasm of lower lobe, right bronchus or lung: Secondary | ICD-10-CM

## 2022-01-03 MED ORDER — OSIMERTINIB MESYLATE 80 MG PO TABS: 80.0000 mg | ORAL_TABLET | Freq: Every day | ORAL | 5 refills | Status: DC

## 2022-01-03 NOTE — Telephone Encounter (Signed)
Advised pt of bx result/sh ?

## 2022-01-03 NOTE — Telephone Encounter (Signed)
Spoke to patient regarding results of the CT scan; thoracentesis-clinically not suggestive of progressive lung cancer. ?  Underlying CHF and volume overload.  Recommend patient follow-up with her cardiologist.  Patient will keep appointment as planned with me.   FYI

## 2022-01-03 NOTE — Telephone Encounter (Signed)
-----   Message from Ralene Bathe, MD sent at 01/02/2022  7:51 PM EDT ----- Diagnosis Skin , glabella BASAL CELL CARCINOMA WITH FOCAL SCLEROSIS, SEE DESCRIPTION  Cancer - BCC As suspected Already treated Recheck next visit

## 2022-01-09 NOTE — Progress Notes (Signed)
Remote pacemaker transmission.   

## 2022-01-12 ENCOUNTER — Encounter: Payer: Self-pay | Admitting: Internal Medicine

## 2022-01-12 ENCOUNTER — Telehealth: Payer: Self-pay | Admitting: Cardiovascular Disease

## 2022-01-12 ENCOUNTER — Inpatient Hospital Stay: Payer: Medicare PPO | Attending: Internal Medicine | Admitting: Internal Medicine

## 2022-01-12 ENCOUNTER — Ambulatory Visit: Payer: Medicare PPO | Admitting: Cardiovascular Disease

## 2022-01-12 ENCOUNTER — Telehealth: Payer: Self-pay | Admitting: *Deleted

## 2022-01-12 DIAGNOSIS — I739 Peripheral vascular disease, unspecified: Secondary | ICD-10-CM | POA: Insufficient documentation

## 2022-01-12 DIAGNOSIS — J910 Malignant pleural effusion: Secondary | ICD-10-CM | POA: Diagnosis not present

## 2022-01-12 DIAGNOSIS — M810 Age-related osteoporosis without current pathological fracture: Secondary | ICD-10-CM | POA: Insufficient documentation

## 2022-01-12 DIAGNOSIS — Z85020 Personal history of malignant carcinoid tumor of stomach: Secondary | ICD-10-CM | POA: Insufficient documentation

## 2022-01-12 DIAGNOSIS — I2510 Atherosclerotic heart disease of native coronary artery without angina pectoris: Secondary | ICD-10-CM | POA: Insufficient documentation

## 2022-01-12 DIAGNOSIS — E876 Hypokalemia: Secondary | ICD-10-CM | POA: Diagnosis not present

## 2022-01-12 DIAGNOSIS — E119 Type 2 diabetes mellitus without complications: Secondary | ICD-10-CM | POA: Insufficient documentation

## 2022-01-12 DIAGNOSIS — Z8673 Personal history of transient ischemic attack (TIA), and cerebral infarction without residual deficits: Secondary | ICD-10-CM | POA: Diagnosis not present

## 2022-01-12 DIAGNOSIS — K219 Gastro-esophageal reflux disease without esophagitis: Secondary | ICD-10-CM | POA: Insufficient documentation

## 2022-01-12 DIAGNOSIS — D693 Immune thrombocytopenic purpura: Secondary | ICD-10-CM | POA: Insufficient documentation

## 2022-01-12 DIAGNOSIS — E538 Deficiency of other specified B group vitamins: Secondary | ICD-10-CM | POA: Insufficient documentation

## 2022-01-12 DIAGNOSIS — Z79899 Other long term (current) drug therapy: Secondary | ICD-10-CM | POA: Insufficient documentation

## 2022-01-12 DIAGNOSIS — Z85828 Personal history of other malignant neoplasm of skin: Secondary | ICD-10-CM | POA: Diagnosis not present

## 2022-01-12 DIAGNOSIS — I700 Atherosclerosis of aorta: Secondary | ICD-10-CM | POA: Diagnosis not present

## 2022-01-12 DIAGNOSIS — Z86010 Personal history of colonic polyps: Secondary | ICD-10-CM | POA: Diagnosis not present

## 2022-01-12 DIAGNOSIS — C3431 Malignant neoplasm of lower lobe, right bronchus or lung: Secondary | ICD-10-CM | POA: Diagnosis present

## 2022-01-12 DIAGNOSIS — N184 Chronic kidney disease, stage 4 (severe): Secondary | ICD-10-CM | POA: Diagnosis not present

## 2022-01-12 MED ORDER — FUROSEMIDE 20 MG PO TABS: 20.0000 mg | ORAL_TABLET | Freq: Every day | ORAL | 0 refills | Status: DC

## 2022-01-12 NOTE — Assessment & Plan Note (Addendum)
#  Stage IV recurrent adenocarcinoma the lung; EGFR mutated. On osimertinib 80 mg [Feb 6th 2020]. SEP 2023- CT CAP- non-contrast- Right lower lobectomy. 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 Associated pleural thickening without nodularity. New septal thickening throughout the right upper and right middle lobes as well as within the left lower lobe. New small left pleural effusion. Findings may be due to congestive heart failure. component of lymphedema on the right is not excluded.  # currently on Osimertinib 80 mg/day; tolerating well.  Continue current therapy-no obvious evidence of recurrent malignancy at this time.  Thoracentesis negative.   #Right pleural effusion-status postthoracentesis-pleural fluid: Transudative; cytology negative for malignancy.  Question CHF/fluid overload.  Reached out to cardiology.  Sent MyChart message-also start the patient on Lasix 20 mg a day.  Recommend compliance with her potassium supplementation.  # RIGHT LOWER quadrant pain- ? Colitis- JUNE 2023-CT scan.  Currently on antibiotics-Per PCP improved not resolved.  Awaitingevaluation with GI [esp rectal thickening noted on CT sep 2023.].  # ITP- chronic- platelets> 100 on promacta 25 mg/day-  STABLE.   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    # right sided stroke/ seizures-on asprin; off plavix;; f/u neurology-[place]-STABLE.    # CKD- stage IV; Left Hydronephrosis s/p ureteral stent placement/nephrolithiasis-~ GFR 25 today; Hypokalemia-3.0 recommend re-starting on  K-citrate q day. STABLE.    #Incidental findings on Imaging  SEP 2023 2023: Difficult to exclude rectal wall thickening; Avascular necrosis of the femoral head; Aortic atherosclerosis (ICD10-I70.0). Coronary  artery calcification. I reviewed/discussed/counseled the patient.   # IV ACCESS: PIV  # DISPOSITION: # ADD BNP to next visit  # keep appt as planned in nov- Dr.B  # I reviewed the blood work- with the patient in detail; also reviewed the imaging independently [as summarized above]; and with the patient in detail.

## 2022-01-12 NOTE — Telephone Encounter (Signed)
Nurse reached out to cardiology at request of Dr. B regarding fluid overload. Spoke with office and they will reach out to schedule patient to see Dr. Julianne Handler.

## 2022-01-12 NOTE — Progress Notes (Signed)
Patient here today for follow up regarding lung cancer. Patient reports fair appetite, continues to drink 1-2 Ensure per day. Patient continues to have shortness of breath with minimal exertion, O2 sats 99% on room air today.

## 2022-01-12 NOTE — Progress Notes (Signed)
[facility] OFFICE PROGRESS NOTE  Patient Care Team: Klein, Bert J III, MD as PCP - General (Internal Medicine) McAlhany, Christopher D, MD as PCP - Cardiology (Cardiology) Allred, James, MD as PCP - Electrophysiology (Cardiology) Brahmanday, Govinda R, MD as Consulting Physician (Internal Medicine) Toledo, Teodoro K, MD as Consulting Physician (Gastroenterology)   Cancer Staging  Primary malignant neoplasm of right lower lobe of lung (HCC) Staging form: Lung, AJCC 7th Edition - Clinical: No stage assigned - Unsigned    Oncology History Overview Note  # DEC 2017- Adeno ca [GATA; her 2 Neu-NEG]; signet ring [1.5 x3 mm gastric incisura; Dr.Skulskie]; EUS [Dr.Burnbridge]; no significant abnormality noted;  Reviewed at [facility] also. JAN 2018- PET NED. April 2018- S/p partial gastrectomy [Dr.Sankar]- STAGE I ADENO CA; NO adjuvant therapy.  # STAGE I CARCINOID s/p partial gastrectomy   # May 2018- Chronic Atrophic gastritis- Prevpac   # FEB 2017- ADENOCARCINOMA with Lepidic 80%-20% acinar pattern; pT2a [Stage IB;T-2.3cm; visceral pleural invasion present; pN=0 ]; AUG 2017- CT NED;   # DEC 2019- RECURRENT/STAGE IV ADENO LUNG CA- EGFR MUTATED;# Jan 6th 2020-; START Osidemrtinib;  # AUG 2020- SEVERE AS [s/p TAVR; [place]]-complicated by R sided stroke/ seizures/acute renal failure.   # DEC 2nd 2020- START PROMACTA 25 mg/day [ITP]  # Molecular testing: EFGR mutated [id number] [omniseq]  # Palliative: O-1/20   DIAGNOSIS:  #ADENO CA LUNG-STAGE IV #Stomach adeno ca [stage I; dec 2017]  GOALS: pallaitive  CURRENT/MOST RECENT THERAPY - OSIMERTINIB [Jan 6th 2020]      Primary malignant neoplasm of right lower lobe of lung (HCC)  12/02/2015 Initial Diagnosis   Primary malignant neoplasm of right lower lobe of lung (HCC)   Adenocarcinoma of gastric cardia (HCC)   INTERVAL HISTORY: Alone.  Ambulating independantly.   Stacy Cook 86 y.o.  female pleasant patient above history  of recurrent/stage IV adenocarcinoma lung/EGFR mutated currently on osimertinib is here for follow-up/ results of the CT scan.  Patient s/p thoracentesis for right pleural effusion.Patient continues to have shortness of breath with minimal exertion, O2 sats 99% on room air today.  Continues to have mild to moderate cough.   No hemoptysis.  Denies any worsening swelling of the legs. No history of ongoing intermittent abdominal pain right lower quadrant.  Currently pain resolved.  Awaiting GI evaluation.  Review of Systems  Constitutional:  Positive for malaise/fatigue. Negative for chills, diaphoresis and fever.  HENT:  Negative for nosebleeds and sore throat.   Eyes:  Negative for double vision.  Respiratory:  Negative for hemoptysis, sputum production and wheezing.   Cardiovascular:  Negative for chest pain, palpitations, orthopnea and leg swelling.  Gastrointestinal:  Negative for blood in stool, constipation, melena, nausea and vomiting.  Genitourinary:  Negative for dysuria, frequency and urgency.  Musculoskeletal:  Positive for back pain and joint pain.  Neurological:  Positive for focal weakness. Negative for dizziness, tingling, weakness and headaches.  Endo/Heme/Allergies:  Bruises/bleeds easily.  Psychiatric/Behavioral:  Negative for depression. The patient is not nervous/anxious and does not have insomnia.       PAST MEDICAL HISTORY :  Past Medical History:  Diagnosis Date   Abdominal pain    Acid reflux 03/24/2015   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   Allergic genetic state    Anemia    Arteriosclerosis of coronary artery 03/24/2015   Arthritis    "back, hands" (10/15/2017)   Asthma    B12 deficiency anemia    Basal cell carcinoma   01/01/2022   glabella, EDC   Bile reflux gastritis    CAD (coronary artery disease)    Cancer of right lung ([place]) 05/09/2015   Dr. Genevive Bi performed Right lower lobe lobectomy.    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 08/03/2016    Carotid stenosis 02/07/2016   Cataract cortical, senile    Chest pain    Colon polyp    Degeneration of intervertebral disc of lumbar region 03/11/2014   Diabetes ([place])    Gastric adenocarcinoma (HCC)    GERD (gastroesophageal reflux disease)    also, history of ulcers   History of kidney stones    History of stroke    HLD (hyperlipidemia) 08/24/2013   Hypertension    Malignant tumor of stomach ([place] 07/2016   Adenocarcinoma, diffuse, poorly differentiated, signet ring, stage I   Neuritis or radiculitis due to rupture of lumbar intervertebral disc 09/10/2014   Neuroendocrine tumor 03/24/2015   Neuroma    Osteoporosis    Peripheral vascular disease (HCC)    PONV (postoperative nausea and vomiting)    Presence of permanent cardiac pacemaker 12/09/2018   Primary malignant neoplasm of right lower lobe of lung ([place]) 12/02/2015   Renal insufficiency    Renal stone    S/P TAVR (transcatheter aortic valve replacement)    Sciatica    Severe aortic stenosis    Skin cancer    "cut/burned LUE; cut off right eye/nose & cut off chest" (10/15/2017)   Stomach ulcer    Thrombocytopenia ([place])    Type 2 diabetes, diet controlled ([place])    "no RX since stomach OR 07/2016" (10/15/2017)    PAST SURGICAL HISTORY :   Past Surgical History:  Procedure Laterality Date   APPENDECTOMY     CARDIAC CATHETERIZATION  X2 before 10/15/2017   CARDIAC VALVE REPLACEMENT     CATARACT EXTRACTION W/PHACO Left 04/04/2017   Procedure: CATARACT EXTRACTION PHACO AND INTRAOCULAR LENS PLACEMENT ([place]);  Surgeon: Leandrew Koyanagi, MD;  Location: [facility];  Service: Ophthalmology;  Laterality: Left;  Lot # [id number] H [place] 1:00 Ap 25% CDE 8.54   CATARACT EXTRACTION W/PHACO Right 05/15/2017   Procedure: CATARACT EXTRACTION PHACO AND INTRAOCULAR LENS PLACEMENT (IOC);  Surgeon: Leandrew Koyanagi, MD;  Location: [facility];  Service: Ophthalmology;  Laterality: Right;  [place] 01:10 AP% 18.3 CDE 12.91 Fluid pack lot # [id number]    COLONOSCOPY     CORONARY ATHERECTOMY N/A 10/15/2017   Procedure: CORONARY ATHERECTOMY;  Surgeon: Burnell Blanks, MD;  Location: [facility];  Service: Cardiovascular;  Laterality: N/A;   CORONARY STENT INTERVENTION N/A 10/15/2017   Procedure: CORONARY STENT INTERVENTION;  Surgeon: Burnell Blanks, MD;  Location: [facility];  Service: Cardiovascular;  Laterality: N/A;   CYSTOSCOPY/URETEROSCOPY/HOLMIUM LASER/STENT PLACEMENT Left 08/03/2019   Procedure: CYSTOSCOPY/URETEROSCOPY/HOLMIUM LASER/STENT PLACEMENT;  Surgeon: Hollice Espy, MD;  Location: [facility];  Service: Urology;  Laterality: Left;   ESOPHAGOGASTRODUODENOSCOPY     ESOPHAGOGASTRODUODENOSCOPY (EGD) WITH PROPOFOL N/A 03/27/2016   Procedure: ESOPHAGOGASTRODUODENOSCOPY (EGD) WITH PROPOFOL;  Surgeon: Lollie Sails, MD;  Location: [facility] ENDOSCOPY;  Service: Endoscopy;  Laterality: N/A;   ESOPHAGOGASTRODUODENOSCOPY (EGD) WITH PROPOFOL N/A 05/28/2016   Procedure: ESOPHAGOGASTRODUODENOSCOPY (EGD) WITH PROPOFOL;  Surgeon: Lollie Sails, MD;  Location: [facility] ENDOSCOPY;  Service: Endoscopy;  Laterality: N/A;   ESOPHAGOGASTRODUODENOSCOPY (EGD) WITH PROPOFOL N/A 09/25/2016   Procedure: ESOPHAGOGASTRODUODENOSCOPY (EGD) WITH PROPOFOL;  Surgeon: Christene Lye, MD;  Location: [facility] ENDOSCOPY;  Service: Endoscopy;  Laterality: N/A;   ESOPHAGOGASTRODUODENOSCOPY (EGD) WITH  PROPOFOL N/A 12/18/2016   Procedure: ESOPHAGOGASTRODUODENOSCOPY (EGD) WITH PROPOFOL;  Surgeon: Sankar, Seeplaputhur G, MD;  Location: [facility] ENDOSCOPY;  Service: Endoscopy;  Laterality: N/A;   ESOPHAGOGASTRODUODENOSCOPY (EGD) WITH PROPOFOL N/A 01/23/2017   Procedure: ESOPHAGOGASTRODUODENOSCOPY (EGD) WITH PROPOFOL;  Surgeon: Sankar, Seeplaputhur G, MD;  Location: [facility] ENDOS[facility];  Service: Endoscopy;  Laterality: N/A;   ESOPHAGOGASTRODUODENOSCOPY (EGD) WITH PROPOFOL N/A 03/20/2017   Procedure: ESOPHAGOGASTRODUODENOSCOPY (EGD) WITH PROPOFOL;   Surgeon: Sankar, Seeplaputhur G, MD;  Location: [facility];  Service: Endoscopy;  Laterality: N/A;   ESOPHAGOGASTRODUODENOSCOPY (EGD) WITH PROPOFOL N/A 09/23/2020   Procedure: ESOPHAGOGASTRODUODENOSCOPY (EGD) WITH PROPOFOL;  Surgeon: Locklear, Cameron T, MD;  Location: [facility] END[facility]COPY;  Service: Endoscopy;  Laterality: N/A;  [facility] SAYS "AMPICILLIN IS NEEDED"   FRACTURE SURGERY     INSERT / REPLACE / REMOVE PACEMAKER  12/09/2018   PACEMAKER LEADLESS INSERTION N/A 12/09/2018   Procedure: PACEMAKER LEADLESS INSERTION;  Surgeon: Allred, James, MD;  [facility];  Service: Cardiovascular;  Laterality: N/A;   PARTIAL GASTRECTOMY N/A 08/03/2016   Hemigastrectomy, Billroth I reconstruction Surgeon: Seeplaputhur G Sankar, MD;  Location: [facility];  Service: General;  Laterality: N/A;   RIGHT/LEFT HEART CATH AND CORONARY ANGIOGRAPHY Bilateral 09/19/2017   Procedure: RIGHT/LEFT HEART CATH AND CORONARY ANGIOGRAPHY;  Surgeon: Callwood, Dwayne D, MD;  Location: [facility];  Service: Cardiovascular;  Laterality: Bilateral;   SHOULDER ARTHROSCOPY W/ CAPSULAR REPAIR Right    SKIN CANCER EXCISION     "cut/burned LUE; cut off right eye/nose & cut off chest" (10/15/2017)   TEE WITHOUT CARDIOVERSION N/A 12/02/2018   Procedure: TRANSESOPHAGEAL ECHOCARDIOGRAM (TEE);  Surgeon: McAlhany, Christopher D, MD;  [facility];  Service: Open Heart Surgery;  Laterality: N/A;   THORACOTOMY Right 05/09/2015   Procedure: THORACOTOMY, RIGHT LOWER LOBECTOMY, BRONCHOSCOPY;  Surgeon: Timothy Oaks, MD;  Location: [facility];  Service: Thoracic;  Laterality: Right;   TONSILLECTOMY  1944   TRANSCATHETER AORTIC VALVE REPLACEMENT, TRANSFEMORAL N/A 12/02/2018   Procedure: TRANSCATHETER AORTIC VALVE REPLACEMENT, TRANSFEMORAL;  Surgeon: McAlhany, Christopher D, MD;  [facility];  Service: Open Heart Surgery;  Laterality: N/A;   TUBAL LIGATION     UPPER GI ENDOSCOPY N/A 08/03/2016    Procedure: UPPER  ENDOSCOPY;  Surgeon: Seeplaputhur G Sankar, MD;  Location: [facility];  Service: General;  Laterality: N/A;   VAGINAL HYSTERECTOMY     WRIST FRACTURE SURGERY Right     FAMILY HISTORY :   Family History  Problem Relation Age of Onset   Aortic aneurysm Mother    Kidney Stones Mother    Hypertension Mother    Hypertension Father    Diabetes Other    Breast cancer Neg Hx    Prostate cancer Neg Hx    Bladder Cancer Neg Hx    Kidney cancer Neg Hx     SOCIAL HISTORY:   Social History   Tobacco Use   Smoking status: Never   Smokeless tobacco: Never  Vaping Use   Vaping Use: Never used  Substance Use Topics   Alcohol use: Not Currently   Drug use: Never    ALLERGIES:  is allergic to mirtazapine, pneumococcal vaccine, statins, and sulfa antibiotics.  MEDICATIONS:  Current Outpatient Medications  Medication Sig Dispense Refill   aspirin EC 81 MG tablet Take 81 mg by mouth daily.     cholecalciferol (VITAMIN D) 1000 UNITS tablet Take 1,000 Units by mouth daily.     cyanocobalamin 1000 MCG tablet Take 1,000   mcg by mouth daily.      eltrombopag (PROMACTA) 25 MG tablet TAKE 1 TABLET BY MOUTH DAILY. TAKE ON AN EMPTY STOMACH, 1 HOUR BEFORE A MEAL OR 2 HOURS AFTER. 30 tablet 6   ezetimibe (ZETIA) 10 MG tablet Take 10 mg by mouth daily.     furosemide (LASIX) 20 MG tablet Take 1 tablet (20 mg total) by mouth daily. 30 tablet 0   isosorbide mononitrate (IMDUR) 30 MG 24 hr tablet Take 1 tablet by mouth once daily 90 tablet 3   Magnesium 100 MG TABS Take by mouth.     metoprolol succinate (TOPROL-XL) 50 MG 24 hr tablet Take 1 tablet (50 mg total) by mouth every evening. Take with or immediately following a meal. 30 tablet 0   osimertinib mesylate (TAGRISSO) 80 MG tablet Take 1 tablet (80 mg total) by mouth daily. 30 tablet 5   potassium citrate (UROCIT-K) 10 MEQ (1080 MG) SR tablet Take 1 tablet (10 mEq total) by mouth daily. 90 tablet 0   No current facility-administered  medications for this visit.    PHYSICAL EXAMINATION: ECOG PERFORMANCE STATUS: 0 - Asymptomatic  BP (!) 160/71   Pulse 82   Temp 97.7 F (36.5 C) (Tympanic)   Resp 18   Wt 104 lb (47.2 kg)   SpO2 99%   BMI 19.65 kg/m   Filed Weights   01/12/22 1036  Weight: 104 lb (47.2 kg)   Decreased breath sounds on the right side compared to left.  Physical Exam Constitutional:      Comments: Patient walking by herself; no assistive devices.  Alone.  HENT:     Head: Normocephalic and atraumatic.     Mouth/Throat:     Pharynx: No oropharyngeal exudate.  Eyes:     Pupils: Pupils are equal, round, and reactive to light.  Cardiovascular:     Rate and Rhythm: Normal rate and regular rhythm.     Heart sounds: Murmur heard.  Pulmonary:     Effort: No respiratory distress.     Breath sounds: No wheezing.  Abdominal:     General: Bowel sounds are normal. There is no distension.     Palpations: Abdomen is soft. There is no mass.     Tenderness: There is no abdominal tenderness. There is no guarding or rebound.  Musculoskeletal:        General: No tenderness. Normal range of motion.     Cervical back: Normal range of motion and neck supple.  Skin:    General: Skin is warm.  Neurological:     Mental Status: She is alert and oriented to person, place, and time.     Comments: Weakness of the right upper extremity more than lower extremity.  Psychiatric:        Mood and Affect: Affect normal.    LABORATORY DATA:  I have reviewed the data as listed    Component Value Date/Time   NA 138 12/19/2021 1319   NA 144 01/24/2018 0847   NA 141 07/21/2012 1529   K 3.0 (L) 12/19/2021 1319   K 3.9 07/21/2012 1529   CL 108 12/19/2021 1319   CL 109 (H) 07/21/2012 1529   CO2 25 12/19/2021 1319   CO2 27 07/21/2012 1529   GLUCOSE 120 (H) 12/19/2021 1319   GLUCOSE 104 (H) 07/21/2012 1529   BUN 22 12/19/2021 1319   BUN 24 01/24/2018 0847   BUN 16 07/21/2012 1529   CREATININE 1.25 (H) 12/19/2021  1319  CREATININE 1.11 09/03/2012 1535   CALCIUM 8.5 (L) 12/19/2021 1319   CALCIUM 8.9 07/21/2012 1529   PROT 6.1 (L) 12/19/2021 1319   PROT 7.1 07/21/2012 1529   ALBUMIN 3.5 12/19/2021 1319   ALBUMIN 3.9 07/21/2012 1529   AST 16 12/19/2021 1319   AST 26 07/21/2012 1529   ALT 8 12/19/2021 1319   ALT 22 07/21/2012 1529   ALKPHOS 68 12/19/2021 1319   ALKPHOS 76 07/21/2012 1529   BILITOT 0.5 12/19/2021 1319   BILITOT 0.3 07/21/2012 1529   GFRNONAA 43 (L) 12/19/2021 1319   GFRNONAA 49 (L) 09/03/2012 1535   GFRAA 29 (L) 01/05/2020 1440   GFRAA 57 (L) 09/03/2012 1535    No results found for: "SPEP", "UPEP"  Lab Results  Component Value Date   WBC 3.4 (L) 12/19/2021   NEUTROABS 2.3 12/19/2021   HGB 9.5 (L) 12/19/2021   HCT 29.2 (L) 12/19/2021   MCV 89.3 12/19/2021   PLT 118 (L) 12/19/2021      Chemistry      Component Value Date/Time   NA 138 12/19/2021 1319   NA 144 01/24/2018 0847   NA 141 07/21/2012 1529   K 3.0 (L) 12/19/2021 1319   K 3.9 07/21/2012 1529   CL 108 12/19/2021 1319   CL 109 (H) 07/21/2012 1529   CO2 25 12/19/2021 1319   CO2 27 07/21/2012 1529   BUN 22 12/19/2021 1319   BUN 24 01/24/2018 0847   BUN 16 07/21/2012 1529   CREATININE 1.25 (H) 12/19/2021 1319   CREATININE 1.11 09/03/2012 1535      Component Value Date/Time   CALCIUM 8.5 (L) 12/19/2021 1319   CALCIUM 8.9 07/21/2012 1529   ALKPHOS 68 12/19/2021 1319   ALKPHOS 76 07/21/2012 1529   AST 16 12/19/2021 1319   AST 26 07/21/2012 1529   ALT 8 12/19/2021 1319   ALT 22 07/21/2012 1529   BILITOT 0.5 12/19/2021 1319   BILITOT 0.3 07/21/2012 1529       RADIOGRAPHIC STUDIES: I have personally reviewed the radiological images as listed and agreed with the findings in the report. No results found.   ASSESSMENT & PLAN:  Primary malignant neoplasm of right lower lobe of lung ([place]) # Stage IV recurrent adenocarcinoma the lung; EGFR mutated. On osimertinib 80 mg [Feb 6th 2020]. SEP 2023- CT  CAP- non-contrast- Right lower lobectomy. 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 Associated pleural thickening without nodularity. New septal thickening throughout the right upper and right middle lobes as well as within the left lower lobe. New small left pleural effusion. Findings may be due to congestive heart failure. component of lymphedema on the right is not excluded.   # currently on Osimertinib 80 mg/day; tolerating well.  Continue current therapy-no obvious evidence of recurrent malignancy at this time.  Thoracentesis negative.   #Right pleural effusion-status postthoracentesis-pleural fluid: Transudative; cytology negative for malignancy.  Question CHF/fluid overload.  Reached out to cardiology.  Sent MyChart message-also start the patient on Lasix 20 mg a day.  Recommend compliance with her potassium supplementation.  # RIGHT LOWER quadrant pain- ? Colitis- JUNE 2023-CT scan.  Currently on antibiotics-Per PCP improved not resolved.  Awaitingevaluation with GI [esp rectal thickening noted on CT sep 2023.].  # ITP- chronic- platelets> 100 on promacta 25 mg/day-  STABLE.   # Stomach cancer stage I  status post gastrectomy; June 2022- S/p EGD- [facility].-  STABLE.    # right sided stroke/ seizures-on asprin; off plavix;; f/u neurology-[place]-STABLE.    # CKD- stage IV; Left Hydronephrosis s/p ureteral stent placement/nephrolithiasis-~ GFR 25 today; Hypokalemia-3.0 recommend re-starting on  K-citrate q day. STABLE.    #Incidental findings on Imaging  SEP 2023 2023: Difficult to exclude rectal wall thickening; Avascular necrosis of the femoral head; Aortic atherosclerosis (ICD10-I70.0). Coronary artery calcification. I reviewed/discussed/counseled the patient.   # IV ACCESS: PIV  # DISPOSITION: # ADD BNP to next  visit  # keep appt as planned in nov- Dr.B  # I reviewed the blood work- with the patient in detail; also reviewed the imaging independently [as summarized above]; and with the patient in detail.         Orders Placed This Encounter  Procedures   Brain natriuretic peptide    Standing Status:   Future    Standing Expiration Date:   01/13/2023   All questions were answered. The patient knows to call the clinic with any problems, questions or concerns.      Cammie Sickle, MD 01/12/2022 11:40 AM

## 2022-01-12 NOTE — Telephone Encounter (Signed)
I received a phone call by patient's oncologist and wanted patient to be seen urgently. saw that Dr. Angelena Form had a 3 pm slot available and put patient in slot. she last saw McAlhany in 2020. she is calling now to see if someone can take her to appt
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
## 2022-01-16 ENCOUNTER — Encounter: Payer: Self-pay | Admitting: Cardiology

## 2022-01-16 ENCOUNTER — Ambulatory Visit: Payer: Medicare PPO | Attending: Cardiovascular Disease | Admitting: Cardiology

## 2022-01-16 VITALS — BP 112/64 | HR 80 | Ht 61.0 in | Wt 102.2 lb

## 2022-01-16 DIAGNOSIS — Z952 Presence of prosthetic heart valve: Secondary | ICD-10-CM | POA: Diagnosis not present

## 2022-01-16 DIAGNOSIS — Z79899 Other long term (current) drug therapy: Secondary | ICD-10-CM | POA: Diagnosis not present

## 2022-01-16 DIAGNOSIS — I459 Conduction disorder, unspecified: Secondary | ICD-10-CM

## 2022-01-16 DIAGNOSIS — I25118 Atherosclerotic heart disease of native coronary artery with other forms of angina pectoris: Secondary | ICD-10-CM

## 2022-01-16 DIAGNOSIS — I10 Essential (primary) hypertension: Secondary | ICD-10-CM

## 2022-01-16 DIAGNOSIS — I5033 Acute on chronic diastolic (congestive) heart failure: Secondary | ICD-10-CM

## 2022-01-16 DIAGNOSIS — I350 Nonrheumatic aortic (valve) stenosis: Secondary | ICD-10-CM

## 2022-01-16 DIAGNOSIS — N184 Chronic kidney disease, stage 4 (severe): Secondary | ICD-10-CM

## 2022-01-16 DIAGNOSIS — Z950 Presence of cardiac pacemaker: Secondary | ICD-10-CM

## 2022-01-16 DIAGNOSIS — I442 Atrioventricular block, complete: Secondary | ICD-10-CM

## 2022-01-16 NOTE — Progress Notes (Signed)
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 diagnosis, procedures, and orders are all accurate and complete.   Signed, Freada Bergeron, MD  01/16/2022 9:36 AM    [place]

## 2022-01-16 NOTE — Patient Instructions (Addendum)
Medication Instructions:   Your physician recommends that you continue on your current medications as directed. Please refer to the Current Medication list given to you today.  *If you need a refill on your cardiac medications before your next appointment, please call your pharmacy*   Lab Work:  TODAY--BMET AND PRO-BNP  If you have labs (blood work) drawn today and your tests are completely normal, you will receive your results only by: [place] (if you have MyChart) OR A paper copy in the mail If you have any lab test that is abnormal or we need to change your treatment, we will call you to review the results.   Testing/Procedures:  Your physician has requested that you have an echocardiogram. Echocardiography is a painless test that uses sound waves to create images of your heart. It provides your doctor with information about the size and shape of your heart and how well your heart's chambers and valves are working. This procedure takes approximately one hour. There are no restrictions for this procedure.   Follow-Up:  AS SCHEDULED WITH DR. Angelena Form ON 04/16/22 AT 4:20 PM

## 2022-01-17 ENCOUNTER — Telehealth: Payer: Self-pay

## 2022-01-17 ENCOUNTER — Telehealth: Payer: Self-pay | Admitting: *Deleted

## 2022-01-17 DIAGNOSIS — Z79899 Other long term (current) drug therapy: Secondary | ICD-10-CM

## 2022-01-17 DIAGNOSIS — I5033 Acute on chronic diastolic (congestive) heart failure: Secondary | ICD-10-CM

## 2022-01-17 DIAGNOSIS — N184 Chronic kidney disease, stage 4 (severe): Secondary | ICD-10-CM

## 2022-01-17 DIAGNOSIS — Z952 Presence of prosthetic heart valve: Secondary | ICD-10-CM

## 2022-01-17 DIAGNOSIS — I5032 Chronic diastolic (congestive) heart failure: Secondary | ICD-10-CM

## 2022-01-17 LAB — BASIC METABOLIC PANEL
BUN/Creatinine Ratio: 25 (ref 12–28)
BUN: 45 mg/dL — ABNORMAL HIGH (ref 8–27)
CO2: 23 mmol/L (ref 20–29)
Calcium: 9.5 mg/dL (ref 8.7–10.3)
Chloride: 99 mmol/L (ref 96–106)
Creatinine, Ser: 1.77 mg/dL — ABNORMAL HIGH (ref 0.57–1.00)
Glucose: 93 mg/dL (ref 70–99)
Potassium: 5.1 mmol/L (ref 3.5–5.2)
Sodium: 137 mmol/L (ref 134–144)
eGFR: 28 mL/min/{1.73_m2} — ABNORMAL LOW (ref >59)

## 2022-01-17 LAB — PRO B NATRIURETIC PEPTIDE: NT-Pro BNP: 6035 pg/mL — ABNORMAL HIGH (ref 0–738)

## 2022-01-17 MED ORDER — FUROSEMIDE 40 MG PO TABS: ORAL_TABLET | ORAL | 0 refills | Status: DC

## 2022-01-17 NOTE — Telephone Encounter (Signed)
The patient has been notified of the result and verbalized understanding.  All questions (if any) were answered.   Pt aware to increase her lasix to 40 mg po bid for 4 days only, then decrease to taking 40 mg po daily thereafter.    She was advised to continue current potassium regimen as prescribed, and we will advise if additional dosing of this medication is needed, at follow-up labs next week.  Pt aware to come in for repeat BMET and PRO-BNP in one week.  Scheduled her a lab appt to have this done on next Thursday 01/25/22.  Confirmed the pharmacy of choice with the pt.  Pt verbalized understanding and agrees with this plan.

## 2022-01-17 NOTE — Telephone Encounter (Signed)
Oral Oncology Patient Advocate Encounter   Received notification that assistance for Tagrisso has been approved through [employer].  This will allow the patient to receive their medication for $0.  [employer] phone number [phone].   Berdine Addison, [facility] Patient [place]  [phone] (phone) [phone] (fax) 01/17/2022 3:34 PM

## 2022-01-17 NOTE — Telephone Encounter (Signed)
-----   Message from Freada Bergeron, MD sent at 01/17/2022  2:19 PM EDT ----- Her fluid levels are significantly elevated. Cr is elevated but still within range of her prior labs. Can we increase her lasix to 40mg  BID for 4 days and then 40mg  daily thereafter. Repeat BMET and BNP next week to ensure things are improving. Potassium is on the higher side so will not increase it for now but will adjust it next week if needed pending her labs.

## 2022-01-25 ENCOUNTER — Ambulatory Visit: Payer: Medicare PPO | Attending: Cardiology

## 2022-01-25 DIAGNOSIS — I5033 Acute on chronic diastolic (congestive) heart failure: Secondary | ICD-10-CM

## 2022-01-25 DIAGNOSIS — N184 Chronic kidney disease, stage 4 (severe): Secondary | ICD-10-CM

## 2022-01-25 DIAGNOSIS — Z79899 Other long term (current) drug therapy: Secondary | ICD-10-CM

## 2022-01-25 DIAGNOSIS — I5032 Chronic diastolic (congestive) heart failure: Secondary | ICD-10-CM

## 2022-01-25 DIAGNOSIS — Z952 Presence of prosthetic heart valve: Secondary | ICD-10-CM

## 2022-01-26 ENCOUNTER — Ambulatory Visit: Payer: Medicare PPO

## 2022-01-26 ENCOUNTER — Telehealth: Payer: Self-pay | Admitting: *Deleted

## 2022-01-26 DIAGNOSIS — Z79899 Other long term (current) drug therapy: Secondary | ICD-10-CM

## 2022-01-26 DIAGNOSIS — I5032 Chronic diastolic (congestive) heart failure: Secondary | ICD-10-CM

## 2022-01-26 DIAGNOSIS — R7989 Other specified abnormal findings of blood chemistry: Secondary | ICD-10-CM

## 2022-01-26 DIAGNOSIS — Z952 Presence of prosthetic heart valve: Secondary | ICD-10-CM

## 2022-01-26 DIAGNOSIS — N184 Chronic kidney disease, stage 4 (severe): Secondary | ICD-10-CM

## 2022-01-26 DIAGNOSIS — R799 Abnormal finding of blood chemistry, unspecified: Secondary | ICD-10-CM

## 2022-01-26 DIAGNOSIS — I5033 Acute on chronic diastolic (congestive) heart failure: Secondary | ICD-10-CM

## 2022-01-26 LAB — BASIC METABOLIC PANEL
BUN/Creatinine Ratio: 33 — ABNORMAL HIGH (ref 12–28)
BUN: 76 mg/dL (ref 8–27)
CO2: 26 mmol/L (ref 20–29)
Calcium: 9.4 mg/dL (ref 8.7–10.3)
Chloride: 95 mmol/L — ABNORMAL LOW (ref 96–106)
Creatinine, Ser: 2.27 mg/dL — ABNORMAL HIGH (ref 0.57–1.00)
Glucose: 120 mg/dL — ABNORMAL HIGH (ref 70–99)
Potassium: 3.5 mmol/L (ref 3.5–5.2)
Sodium: 138 mmol/L (ref 134–144)
eGFR: 21 mL/min/{1.73_m2} — ABNORMAL LOW (ref >59)

## 2022-01-26 LAB — PRO B NATRIURETIC PEPTIDE: NT-Pro BNP: 4611 pg/mL — ABNORMAL HIGH (ref 0–738)

## 2022-01-26 MED ORDER — FUROSEMIDE 20 MG PO TABS: 20.0000 mg | ORAL_TABLET | Freq: Every day | ORAL | 1 refills | Status: DC

## 2022-01-26 NOTE — Telephone Encounter (Signed)
-----   Message from Freada Bergeron, MD sent at 01/26/2022  8:11 AM EDT ----- Cr bumped and BUN is 76. BNP better. Can we hold her lasix for 3 days and then resume at 20mg  daily thereafter? Will give one time dose of potassium 68mEq. Can we repeat BMET next week to ensure numbers are better?

## 2022-01-26 NOTE — Telephone Encounter (Signed)
The patient has been notified of the result and verbalized understanding.  All questions (if any) were answered.  Pt aware to hold lasix for 3 days only, then resume back taking lasix 20 mg po daily thereafter.  She is aware that she will need to take KDUR 20 mEq today only and come in for repeat BMET in a week.   Pt states she is already on potassium 10 mEq and is asking should she just take an extra dose of that for today only.  Made Dr. Johney Frame aware of this and she advised that would be ok for her to take an extra dose of her potassium citrate 10 mEq for today only, to get 20 mEq for today.   Scheduled the pt a repeat BMET on 10/30, and okayed by Dr. Johney Frame to have this done that day.  Confirmed the pharmacy of choice with the pt.   Pt verbalized understanding and agrees with this plan.

## 2022-01-31 ENCOUNTER — Other Ambulatory Visit (HOSPITAL_COMMUNITY): Payer: Medicare PPO

## 2022-01-31 DIAGNOSIS — N1832 Chronic kidney disease, stage 3b: Secondary | ICD-10-CM | POA: Insufficient documentation

## 2022-01-31 DIAGNOSIS — N184 Chronic kidney disease, stage 4 (severe): Secondary | ICD-10-CM | POA: Insufficient documentation

## 2022-02-05 ENCOUNTER — Ambulatory Visit: Payer: Medicare PPO | Attending: Cardiology

## 2022-02-05 ENCOUNTER — Telehealth: Payer: Self-pay | Admitting: *Deleted

## 2022-02-05 ENCOUNTER — Encounter (INDEPENDENT_AMBULATORY_CARE_PROVIDER_SITE_OTHER): Payer: Self-pay

## 2022-02-05 ENCOUNTER — Ambulatory Visit: Payer: Medicare PPO

## 2022-02-05 DIAGNOSIS — R7989 Other specified abnormal findings of blood chemistry: Secondary | ICD-10-CM | POA: Insufficient documentation

## 2022-02-05 DIAGNOSIS — I5032 Chronic diastolic (congestive) heart failure: Secondary | ICD-10-CM

## 2022-02-05 DIAGNOSIS — R799 Abnormal finding of blood chemistry, unspecified: Secondary | ICD-10-CM | POA: Diagnosis present

## 2022-02-05 DIAGNOSIS — I5033 Acute on chronic diastolic (congestive) heart failure: Secondary | ICD-10-CM | POA: Insufficient documentation

## 2022-02-05 DIAGNOSIS — Z79899 Other long term (current) drug therapy: Secondary | ICD-10-CM | POA: Diagnosis present

## 2022-02-05 DIAGNOSIS — N184 Chronic kidney disease, stage 4 (severe): Secondary | ICD-10-CM | POA: Diagnosis present

## 2022-02-05 DIAGNOSIS — I340 Nonrheumatic mitral (valve) insufficiency: Secondary | ICD-10-CM

## 2022-02-05 DIAGNOSIS — Z952 Presence of prosthetic heart valve: Secondary | ICD-10-CM | POA: Diagnosis not present

## 2022-02-05 LAB — ECHOCARDIOGRAM COMPLETE
AR max vel: 0.88 cm2
AV Area VTI: 0.82 cm2
AV Area mean vel: 0.89 cm2
AV Mean grad: 9.4 mmHg
AV Peak grad: 18.7 mmHg
Ao pk vel: 2.16 m/s
Area-P 1/2: 3.67 cm2
S' Lateral: 2.5 cm

## 2022-02-05 NOTE — Telephone Encounter (Signed)
The patient has been notified of the result and verbalized understanding.  All questions (if any) were answered.  Pt aware I will go ahead and place the order for repeat echo in one year in the system and send a message to our Echo Scheduler to call her back and arrange this appt for that time.   Pt verbalized understanding and agrees with this plan.

## 2022-02-05 NOTE — Telephone Encounter (Signed)
-----   Message from Freada Bergeron, MD sent at 02/05/2022  4:19 PM EDT ----- Her pumping function looks good at 60-65%. There are mildly elevated blood pressures in her lungs which we will need to monitor going forward. Her aortic valve looks great with normal valve function. There is mild leakiness of her mitral valve. Overall, this is stable from her prior echo. We will continue with yearly monitoring.

## 2022-02-06 LAB — BASIC METABOLIC PANEL
BUN/Creatinine Ratio: 26 (ref 12–28)
BUN: 34 mg/dL — ABNORMAL HIGH (ref 8–27)
CO2: 23 mmol/L (ref 20–29)
Calcium: 8.8 mg/dL (ref 8.7–10.3)
Chloride: 105 mmol/L (ref 96–106)
Creatinine, Ser: 1.31 mg/dL — ABNORMAL HIGH (ref 0.57–1.00)
Glucose: 101 mg/dL — ABNORMAL HIGH (ref 70–99)
Potassium: 4.3 mmol/L (ref 3.5–5.2)
Sodium: 141 mmol/L (ref 134–144)
eGFR: 40 mL/min/{1.73_m2} — ABNORMAL LOW (ref >59)

## 2022-02-09 ENCOUNTER — Other Ambulatory Visit (HOSPITAL_COMMUNITY): Payer: Self-pay

## 2022-02-14 ENCOUNTER — Telehealth: Payer: Self-pay

## 2022-02-14 NOTE — Telephone Encounter (Signed)
Oral Oncology Patient Advocate Encounter  Reached out and spoke with patient regarding PAP paperwork, explained that I would send it to their preferred email via [employer].   Confirmed email address: [email or web address].    Patient expressed understanding and consent.  Will follow up once paperwork has been signed and returned.   Berdine Addison, [facility] Patient [place]  [phone] (phone) [phone] (fax) 02/14/2022 3:11 PM

## 2022-02-14 NOTE — Telephone Encounter (Signed)
Oral Oncology Patient Advocate Encounter   Received notification that patient is due for re-enrollment for assistance for Promacta through [employer].   Re-enrollment process has been initiated and will be submitted upon completion of necessary documents.  [employer]' phone number [phone].   I will continue to follow until final determination.  Berdine Addison, [facility] Patient [place]  [phone] (phone) [phone] (fax) 02/14/2022 3:10 PM

## 2022-02-15 NOTE — Telephone Encounter (Addendum)
Received Patient Signatures. Waiting on MD Signatures and Proof of Income.   Berdine Addison, [facility] Patient [place]  [phone] (phone) [phone] (fax) 02/15/2022 8:38 AM

## 2022-02-16 ENCOUNTER — Other Ambulatory Visit (INDEPENDENT_AMBULATORY_CARE_PROVIDER_SITE_OTHER): Payer: Self-pay | Admitting: Vascular Surgery

## 2022-02-16 DIAGNOSIS — I739 Peripheral vascular disease, unspecified: Secondary | ICD-10-CM

## 2022-02-16 DIAGNOSIS — I679 Cerebrovascular disease, unspecified: Secondary | ICD-10-CM

## 2022-02-20 ENCOUNTER — Ambulatory Visit: Payer: Medicare PPO | Admitting: Internal Medicine

## 2022-02-20 ENCOUNTER — Other Ambulatory Visit: Payer: Medicare PPO

## 2022-02-20 ENCOUNTER — Ambulatory Visit: Payer: Medicare PPO

## 2022-02-23 ENCOUNTER — Encounter (INDEPENDENT_AMBULATORY_CARE_PROVIDER_SITE_OTHER): Payer: Self-pay | Admitting: Vascular Surgery

## 2022-02-23 ENCOUNTER — Ambulatory Visit (INDEPENDENT_AMBULATORY_CARE_PROVIDER_SITE_OTHER): Payer: Medicare PPO | Admitting: Vascular Surgery

## 2022-02-23 ENCOUNTER — Ambulatory Visit (INDEPENDENT_AMBULATORY_CARE_PROVIDER_SITE_OTHER): Payer: Medicare PPO

## 2022-02-23 VITALS — BP 138/54 | HR 76 | Resp 17 | Ht 61.0 in | Wt 102.8 lb

## 2022-02-23 DIAGNOSIS — I6523 Occlusion and stenosis of bilateral carotid arteries: Secondary | ICD-10-CM

## 2022-02-23 DIAGNOSIS — I739 Peripheral vascular disease, unspecified: Secondary | ICD-10-CM

## 2022-02-23 DIAGNOSIS — I10 Essential (primary) hypertension: Secondary | ICD-10-CM | POA: Diagnosis not present

## 2022-02-23 DIAGNOSIS — E1159 Type 2 diabetes mellitus with other circulatory complications: Secondary | ICD-10-CM

## 2022-02-23 DIAGNOSIS — I679 Cerebrovascular disease, unspecified: Secondary | ICD-10-CM | POA: Diagnosis not present

## 2022-02-23 NOTE — Assessment & Plan Note (Addendum)
Her carotid duplex today reveals slight progression of her right carotid artery stenosis now following just into the 40 to 59% range and stable left carotid artery stenosis also in the 40 to 59% range. No role for intervention at this level.  Continue current medical regimen with aspirin.  Intolerant to statins.  Recheck in 1 year.

## 2022-02-23 NOTE — Progress Notes (Signed)
MRN : [id number]  Stacy Cook is a 86 y.o. (04/16/1935) female who presents with chief complaint of No chief complaint on file. Marland Kitchen  History of Present Illness: Patient returns today in follow up of multiple vascular issues.  She is doing well today.  She denies any major issues or problems.  She denies any focal neurologic symptoms. Specifically, the patient denies amaurosis fugax, speech or swallowing difficulties, or arm or leg weakness or numbness.  Her carotid duplex today reveals slight progression of her right carotid artery stenosis now following just into the 40 to 59% range and stable left carotid artery stenosis also in the 40 to 59% range. She is also followed for some peripheral arterial disease.  No ulceration, infection, or rest pain.  No disabling claudication symptoms. Her carotid duplex today reveals slight progression of her right carotid artery stenosis now following just into the 40 to 59% range and stable left carotid artery stenosis also in the 40 to 59% range.   Current Outpatient Medications  Medication Sig Dispense Refill   aspirin EC 81 MG tablet Take 81 mg by mouth daily.     cholecalciferol (VITAMIN D) 1000 UNITS tablet Take 1,000 Units by mouth daily.     cyanocobalamin 1000 MCG tablet Take 1,000 mcg by mouth daily.      eltrombopag (PROMACTA) 25 MG tablet TAKE 1 TABLET BY MOUTH DAILY. TAKE ON AN EMPTY STOMACH, 1 HOUR BEFORE A MEAL OR 2 HOURS AFTER. 30 tablet 6   ezetimibe (ZETIA) 10 MG tablet Take 10 mg by mouth daily.     isosorbide mononitrate (IMDUR) 30 MG 24 hr tablet Take 1 tablet by mouth once daily 90 tablet 3   Magnesium 100 MG TABS Take by mouth.     metoprolol succinate (TOPROL-XL) 50 MG 24 hr tablet Take 1 tablet (50 mg total) by mouth every evening. Take with or immediately following a meal. 30 tablet 0   osimertinib mesylate (TAGRISSO) 80 MG tablet Take 1 tablet (80 mg total) by mouth daily. 30 tablet 5   potassium citrate (UROCIT-K) 10 MEQ (1080  MG) SR tablet Take 1 tablet (10 mEq total) by mouth daily. 90 tablet 0   furosemide (LASIX) 20 MG tablet Take 1 tablet (20 mg total) by mouth daily. Will start taking this on 01/30/22 for lasix is being held x 3 days. (Patient not taking: Reported on 02/23/2022) 90 tablet 1   No current facility-administered medications for this visit.    Past Medical History:  Diagnosis Date   Abdominal pain    Acid reflux 03/24/2015   Adenocarcinoma of gastric cardia ([place]) 04/10/2016   Allergic genetic state    Anemia    Arteriosclerosis of coronary artery 03/24/2015   Arthritis    "back, hands" (10/15/2017)   Asthma    B12 deficiency anemia    Basal cell carcinoma 01/01/2022   glabella, EDC   Bile reflux gastritis    CAD (coronary artery disease)    Cancer of right lung ([place]) 05/09/2015   Dr. Genevive Bi performed Right lower lobe lobectomy.    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 08/03/2016   Carotid stenosis 02/07/2016   Cataract cortical, senile    Chest pain    Colon polyp    Degeneration of intervertebral disc of lumbar region 03/11/2014   Diabetes ([place])    Gastric adenocarcinoma ([facility])    GERD (gastroesophageal reflux disease)    also, history of ulcers   History of kidney  stones    History of stroke    HLD (hyperlipidemia) 08/24/2013   Hypertension    Malignant tumor of stomach ([place]) 07/2016   Adenocarcinoma, diffuse, poorly differentiated, signet ring, stage I   Neuritis or radiculitis due to rupture of lumbar intervertebral disc 09/10/2014   Neuroendocrine tumor 03/24/2015   Neuroma    Osteoporosis    Peripheral vascular disease ([place])    PONV (postoperative nausea and vomiting)    Presence of permanent cardiac pacemaker 12/09/2018   Primary malignant neoplasm of right lower lobe of lung ([place]) 12/02/2015   Renal insufficiency    Renal stone    S/P TAVR (transcatheter aortic valve replacement)    Sciatica    Severe aortic stenosis    Skin cancer    "cut/burned LUE; cut off right  eye/nose & cut off chest" (10/15/2017)   Stomach ulcer    Thrombocytopenia ([place])    Type 2 diabetes, diet controlled ([place])    "no RX since stomach OR 07/2016" (10/15/2017)    Past Surgical History:  Procedure Laterality Date   APPENDECTOMY     CARDIAC CATHETERIZATION  X2 before 10/15/2017   CARDIAC VALVE REPLACEMENT     CATARACT EXTRACTION W/PHACO Left 04/04/2017   Procedure: CATARACT EXTRACTION PHACO AND INTRAOCULAR LENS PLACEMENT ([place]);  Surgeon: Leandrew Koyanagi, MD;  Location: [facility] ORS;  Service: Ophthalmology;  Laterality: Left;  Lot # [id number] H [place] 1:00 Ap 25% CDE 8.54   CATARACT EXTRACTION W/PHACO Right 05/15/2017   Procedure: CATARACT EXTRACTION PHACO AND INTRAOCULAR LENS PLACEMENT (IOC);  Surgeon: Leandrew Koyanagi, MD;  Location: [facility];  Service: Ophthalmology;  Laterality: Right;  [place] 01:10 AP% 18.3 CDE 12.91 Fluid pack lot # [id number]   COLONOSCOPY     CORONARY ATHERECTOMY N/A 10/15/2017   Procedure: CORONARY ATHERECTOMY;  Surgeon: Burnell Blanks, MD;  Location: [place] [facility];  Service: Cardiovascular;  Laterality: N/A;   CORONARY STENT INTERVENTION N/A 10/15/2017   Procedure: CORONARY STENT INTERVENTION;  Surgeon: Burnell Blanks, MD;  Location: [place] [facility];  Service: Cardiovascular;  Laterality: N/A;   CYSTOSCOPY/URETEROSCOPY/HOLMIUM LASER/STENT PLACEMENT Left 08/03/2019   Procedure: CYSTOSCOPY/URETEROSCOPY/HOLMIUM LASER/STENT PLACEMENT;  Surgeon: Hollice Espy, MD;  Location: [facility];  Service: Urology;  Laterality: Left;   ESOPHAGOGASTRODUODENOSCOPY     ESOPHAGOGASTRODUODENOSCOPY (EGD) WITH PROPOFOL N/A 03/27/2016   Procedure: ESOPHAGOGASTRODUODENOSCOPY (EGD) WITH PROPOFOL;  Surgeon: Lollie Sails, MD;  Location: [facility] ENDOSCOPY;  Service: Endoscopy;  Laterality: N/A;   ESOPHAGOGASTRODUODENOSCOPY (EGD) WITH PROPOFOL N/A 05/28/2016   Procedure: ESOPHAGOGASTRODUODENOSCOPY (EGD) WITH PROPOFOL;  Surgeon: Lollie Sails, MD;  Location:  [facility] ENDOSCOPY;  Service: Endoscopy;  Laterality: N/A;   ESOPHAGOGASTRODUODENOSCOPY (EGD) WITH PROPOFOL N/A 09/25/2016   Procedure: ESOPHAGOGASTRODUODENOSCOPY (EGD) WITH PROPOFOL;  Surgeon: Christene Lye, MD;  Location: [facility] ENDOSCOPY;  Service: Endoscopy;  Laterality: N/A;   ESOPHAGOGASTRODUODENOSCOPY (EGD) WITH PROPOFOL N/A 12/18/2016   Procedure: ESOPHAGOGASTRODUODENOSCOPY (EGD) WITH PROPOFOL;  Surgeon: Christene Lye, MD;  Location: [facility] ENDOSCOPY;  Service: Endoscopy;  Laterality: N/A;   ESOPHAGOGASTRODUODENOSCOPY (EGD) WITH PROPOFOL N/A 01/23/2017   Procedure: ESOPHAGOGASTRODUODENOSCOPY (EGD) WITH PROPOFOL;  Surgeon: Christene Lye, MD;  Location: [facility] ENDOSCOPY;  Service: Endoscopy;  Laterality: N/A;   ESOPHAGOGASTRODUODENOSCOPY (EGD) WITH PROPOFOL N/A 03/20/2017   Procedure: ESOPHAGOGASTRODUODENOSCOPY (EGD) WITH PROPOFOL;  Surgeon: Christene Lye, MD;  Location: [facility] ENDOSCOPY;  Service: Endoscopy;  Laterality: N/A;   ESOPHAGOGASTRODUODENOSCOPY (EGD) WITH PROPOFOL N/A 09/23/2020   Procedure: ESOPHAGOGASTRODUODENOSCOPY (EGD) WITH PROPOFOL;  Surgeon: Lesly Rubenstein, MD;  Location: [facility]  ENDOSCOPY;  Service: Endoscopy;  Laterality: N/A;  [place] SAYS "AMPICILLIN IS NEEDED"   FRACTURE SURGERY     INSERT / REPLACE / REMOVE PACEMAKER  12/09/2018   PACEMAKER LEADLESS INSERTION N/A 12/09/2018   Procedure: PACEMAKER LEADLESS INSERTION;  Surgeon: [name], MD;  Location: [place] [facility];  Service: Cardiovascular;  Laterality: N/A;   PARTIAL GASTRECTOMY N/A 08/03/2016   Hemigastrectomy, Billroth I reconstruction Surgeon: Christene Lye, MD;  Location: [facility] ORS;  Service: General;  Laterality: N/A;   RIGHT/LEFT HEART CATH AND CORONARY ANGIOGRAPHY Bilateral 09/19/2017   Procedure: RIGHT/LEFT HEART CATH AND CORONARY ANGIOGRAPHY;  Surgeon: Yolonda Kida, MD;  Location: [place] [facility];  Service: Cardiovascular;  Laterality: Bilateral;   SHOULDER  ARTHROSCOPY W/ CAPSULAR REPAIR Right    SKIN CANCER EXCISION     "cut/burned LUE; cut off right eye/nose & cut off chest" (10/15/2017)   TEE WITHOUT CARDIOVERSION N/A 12/02/2018   Procedure: TRANSESOPHAGEAL ECHOCARDIOGRAM (TEE);  Surgeon: Burnell Blanks, MD;  Location: [place] [facility];  Service: Open Heart Surgery;  Laterality: N/A;   THORACOTOMY Right 05/09/2015   Procedure: THORACOTOMY, RIGHT LOWER LOBECTOMY, BRONCHOSCOPY;  Surgeon: Nestor Lewandowsky, MD;  Location: [facility] OR[facility];  Service: Thoracic;  Laterality: Right;   TONSILLECTOMY  1944   TRANSCATHETER AORTIC VALVE REPLACEMENT, TRANSFEMORAL N/A 12/02/2018   Procedure: TRANSCATHETER AORTIC VALVE REPLACEMENT, TRANSFEMORAL;  Surgeon: Burnell Blanks, MD;  Location: [place] [facility];  Service: Open Heart Surgery;  Laterality: N/A;   TUBAL LIGATION     UPPER GI ENDOSCOPY N/A 08/03/2016   Procedure: UPPER  ENDOSCOPY;  Surgeon: Christene Lye, MD;  Location: [facility] ORS;  Service: General;  Laterality: N/A;   VAGINAL HYSTERECTOMY     WRIST FRACTURE SURGERY Right      Social History   Tobacco Use   Smoking status: Never   Smokeless tobacco: Never  Vaping Use   Vaping Use: Never used  Substance Use Topics   Alcohol use: Not Currently   Drug use: Never      Family History  Problem Relation Age of Onset   Aortic aneurysm Mother    Kidney Stones Mother    Hypertension Mother    Hypertension Father    Diabetes Other    Breast cancer Neg Hx    Prostate cancer Neg Hx    Bladder Cancer Neg Hx    Kidney cancer Neg Hx      Allergies  Allergen Reactions   Mirtazapine Other (See Comments)    Sluggish    Pneumococcal Vaccine Itching and Other (See Comments)    Hives and fever   Statins Other (See Comments)    Joint pain   Sulfa Antibiotics Nausea And Vomiting   REVIEW OF SYSTEMS (Negative unless checked)   Constitutional: _0 Weight loss  _1 Fever  _2 Chills Cardiac: _3 Chest pain   _4 Chest pressure    _5 Palpitations   _6 Shortness of breath when laying flat   _7 Shortness of breath at rest   _8 Shortness of breath with exertion. Vascular:  _9 Pain in legs with walking   _10 Pain in legs at rest   _11 Pain in legs when laying flat   _12 Claudication   _13 Pain in feet when walking  _14 Pain in feet at rest  _15 Pain in feet when laying flat   _16 History of DVT   _17 Phlebitis   _18 Swelling in legs   _19 Varicose veins   _20 Non-healing ulcers Pulmonary:   _21 Uses home oxygen   _22 Productive cough   _23 Hemoptysis   _24 Wheeze  [  x]COPD   _0 Asthma Neurologic:  _1 Dizziness  _2 Blackouts   _3 Seizures   _4 History of stroke   _5 History of TIA  _6 Aphasia   _7 Temporary blindness   _8 Dysphagia   _9 Weakness or numbness in arms   _10 Weakness or numbness in legs Musculoskeletal:  _11 Arthritis   _12 Joint swelling   _13 Joint pain   _14 Low back pain Hematologic:  _15 Easy bruising  _16 Easy bleeding   _17 Hypercoagulable state   _18 Anemic  _19 Hepatitis Gastrointestinal:  _20 Blood in stool   _21 Vomiting blood  _22 Gastroesophageal reflux/heartburn   _23 Difficulty swallowing. Genitourinary:  _24 Chronic kidney disease   _25 Difficult urination  _26 Frequent urination  _27 Burning with urination   _28 Blood in urine Skin:  _29 Rashes   _30 Ulcers   _31 Wounds Psychological:  _32 History of anxiety   _33  History of major depression.   Physical Examination  BP (!) 138/54 (BP Location: Left Arm)   Pulse 76   Resp 17   Ht _34  (1.549 m)   Wt 102 lb 12.8 oz (46.6 kg)   BMI 19.42 kg/m  Gen:  WD/WN, NAD. Appears younger than stated age. Head: [place]/AT, No temporalis wasting. Ear/Nose/Throat: Hearing grossly intact, nares w/o erythema or drainage Eyes: Conjunctiva clear. Sclera non-icteric Neck: Supple.  Trachea midline Pulmonary:  Good air movement, no use of accessory muscles.  Cardiac: RRR, no JVD Vascular:  Vessel Right Left  Radial Palpable Palpable                          PT Palpable Palpable  DP Palpable Palpable   Gastrointestinal: soft,  non-tender/non-distended. No guarding/reflex.  Musculoskeletal: M/S 5/5 throughout.  No deformity or atrophy. No edema. Neurologic: Sensation grossly intact in extremities.  Symmetrical.  Speech is fluent.  Psychiatric: Judgment intact, Mood & affect appropriate for pt's clinical situation. Dermatologic: No rashes or ulcers noted.  No cellulitis or open wounds.      Labs Recent Results (from the past 2160 hour(s))  Comprehensive metabolic panel     Status: Abnormal   Collection Time: 12/19/21  1:19 PM  Result Value Ref Range   Sodium 138 135 - 145 mmol/L   Potassium 3.0 (L) 3.5 - 5.1 mmol/L   Chloride 108 98 - 111 mmol/L   CO2 25 22 - 32 mmol/L   Glucose, Bld 120 (H) 70 - 99 mg/dL    Comment: Glucose reference range applies only to samples taken after fasting for at least 8 hours.   BUN 22 8 - 23 mg/dL   Creatinine, Ser 1.25 (H) 0.44 - 1.00 mg/dL   Calcium 8.5 (L) 8.9 - 10.3 mg/dL   Total Protein 6.1 (L) 6.5 - 8.1 g/dL   Albumin 3.5 3.5 - 5.0 g/dL   AST 16 15 - 41 U/L   ALT 8 0 - 44 U/L   Alkaline Phosphatase 68 38 - 126 U/L   Total Bilirubin 0.5 0.3 - 1.2 mg/dL   GFR, Estimated 43 (L) >60 mL/min    Comment: (NOTE) Calculated using the CKD-EPI Creatinine Equation (2021)    Anion gap 5 5 - 15    Comment: Performed at [facility], [address] [place], [place] [address]  CBC with Differential/Platelet     Status: Abnormal   Collection Time: 12/19/21  1:19 PM  Result Value Ref Range   WBC 3.4 (L) 4.0 - 10.5 K/uL   RBC 3.27 (L) 3.87 - 5.11 MIL/uL   Hemoglobin 9.5 (L) 12.0 - 15.0 g/dL   HCT 29.2 (L) 36.0 -  46.0 %   MCV 89.3 80.0 - 100.0 fL   MCH 29.1 26.0 - 34.0 pg   MCHC 32.5 30.0 - 36.0 g/dL   RDW 13.5 11.5 - 15.5 %   Platelets 118 (L) 150 - 400 K/uL   nRBC 0.0 0.0 - 0.2 %   Neutrophils Relative % 67 %   Neutro Abs 2.3 1.7 - 7.7 K/uL   Lymphocytes Relative 23 %   Lymphs Abs 0.8 0.7 - 4.0 K/uL   Monocytes Relative 8 %   Monocytes Absolute 0.3 0.1 - 1.0  K/uL   Eosinophils Relative 2 %   Eosinophils Absolute 0.1 0.0 - 0.5 K/uL   Basophils Relative 0 %   Basophils Absolute 0.0 0.0 - 0.1 K/uL   Immature Granulocytes 0 %   Abs Immature Granulocytes 0.01 0.00 - 0.07 K/uL    Comment: Performed at [facility], [address] [place], [place] [address]  Ferritin     Status: None   Collection Time: 12/19/21  1:19 PM  Result Value Ref Range   Ferritin 44 11 - 307 ng/mL    Comment: Performed at [facility], [address] [place], [place] [address]  Iron and TIBC     Status: None   Collection Time: 12/19/21  1:19 PM  Result Value Ref Range   Iron 47 28 - 170 ug/dL   TIBC 286 250 - 450 ug/dL   Saturation Ratios 17 10.4 - 31.8 %   UIBC 239 ug/dL    Comment: Performed at [facility], [address] [place], [place] [address]  Cytology - Non PAP;     Status: None   Collection Time: 12/25/21 10:10 AM  Result Value Ref Range   CYTOLOGY - NON GYN      CYTOLOGY - NON PAP CASE: [id number] PATIENT: Lakysha Wrubel Non-Gynecological Cytology Report     Specimen Submitted: A. Pleural fluid, right  Clinical History: None provided    DIAGNOSIS: A.  PLEURAL FLUID, RIGHT; ULTRASOUND-GUIDED THORACENTESIS: - RARE ATYPICAL CELLS, NOT DIAGNOSTIC OF MALIGNANCY.  Comment: Smears predominantly consist of blood, with a rare cluster of atypical epithelial cells of unclear significance seen on the cytospin preparation. The cell block slides show blood and inflammatory cells, without definite atypia.  Given the history of prior positive pleural fluid, immunohistochemical studies were performed. A single cluster of cells in the cell block shows positivity for TTF-1, although Ber-Ep4 appears negative in the minute portion of tissue. The significance of this staining is unclear, but does not appear diagnostic of malignancy. Calretinin marks scattered single mesothelial cells, signifying that the majority cells present  in the  cell block are likely inflammatory cells and macrophages.  Slides reviewed: 1 ThinPrep, 1 cell block, 1 cytospin  IHC slides were prepared by [facility] for [place] and Pathology, RTP, . All controls stained appropriately.  This test was developed and its performance characteristics determined by [facility]. It has not been cleared or approved by the [place] Food and Drug Administration. The FDA does not require this test to go through premarket FDA review. This test is used for clinical purposes. It should not be regarded as investigational or for research. This laboratory is certified under the Clinical Laboratory Improvement Amendments (CLIA) as qualified to perform high complexity clinical laboratory testing.  GROSS DESCRIPTION: A. Labeled: Pleural fluid (per requisition right) Received: Fresh Collection time: 10:10 AM on 12/25/2021 Placed into formalin time: Not applicable Volume: Approximately 600 mL Description of fluid and  container in which it is re ceived: Received in a glass 1000 mL evacuated container is yellow slightly cloudy fluid. Cytospin slide(s) received: 1  Specimen material submitted for: Cell block and ThinPrep  The cell block material is fixed in formalin for 6 hours prior to processing.  RB 12/26/2021   Final Diagnosis performed by Allena Napoleon, MD.   Electronically signed 12/28/2021 3:30:51PM The electronic signature indicates that the named Attending Pathologist has evaluated the specimen Technical component performed at [facility], [address] [place], [place] [address] Lab: [phone] Dir: Rush Farmer, MD, MMM  Professional component performed at [facility], [facility], [place], [place], [place] [address] Lab: [phone] Dir: Kathi Simpers, MD   Lactate dehydrogenase (pleural or peritoneal fluid)     Status: Abnormal   Collection Time: 12/25/21 10:17 AM  Result Value Ref Range   LD, Fluid 122 (H) 3 -  23 U/L    Comment: (NOTE) Results should be evaluated in conjunction with serum values    Fluid Type-FLDH CYTO PLEU     Comment: Performed at [facility], [address] [place], [place] [address]  Body fluid cell count with differential     Status: Abnormal   Collection Time: 12/25/21 10:17 AM  Result Value Ref Range   Fluid Type-FCT PLEURAL     Comment: CORRECTED ON 09/18 AT 1033: PREVIOUSLY REPORTED AS CYTO PLEU   Color, Fluid YELLOW YELLOW   Appearance, Fluid HAZY (A) CLEAR   Total Nucleated Cell Count, Fluid 111 cu mm   Neutrophil Count, Fluid 5 %   Lymphs, Fluid 82 %   Monocyte-Macrophage-Serous Fluid 12 %   Eos, Fluid 1 %    Comment: Performed at [facility], [address] [place], [place] [address]  Protein, pleural or peritoneal fluid     Status: None   Collection Time: 12/25/21 10:17 AM  Result Value Ref Range   Total protein, fluid <3.0 g/dL    Comment: (NOTE) No normal range established for this test Results should be evaluated in conjunction with serum values    Fluid Type-FTP CYTO PLEU     Comment: Performed at [facility], [address] [place], [place] [address]  Glucose, pleural or peritoneal fluid     Status: None   Collection Time: 12/25/21 10:17 AM  Result Value Ref Range   Glucose, Fluid 73 mg/dL    Comment: (NOTE) No normal range established for this test Results should be evaluated in conjunction with serum values    Fluid Type-FGLU CYTO PLEU     Comment: Performed at [facility], [address] [place], [place] [address]  Body fluid culture w Gram Stain     Status: None   Collection Time: 12/25/21 10:17 AM   Specimen: PATH Cytology Pleural fluid  Result Value Ref Range   Specimen Description      PLEURAL Performed at [facility], [address] [place], [place] [address]    Special Requests      NONE Performed at [facility], [place], [place] [address]     Gram Stain      RARE WBC PRESENT,BOTH PMN AND MONONUCLEAR NO ORGANISMS SEEN    Culture      NO GROWTH 3 DAYS Performed at [facility], [place] [address], [place], [place] [address]    Report Status 12/28/2021 FINAL   Miscellaneous [facility] test (send-out)     Status: None  Collection Time: 12/25/21 10:17 AM  Result Value Ref Range   [facility] test code 478295    [facility] test name quest [id number] ph bf pleural    Misc [facility] result See Scanned report in [place]: Performed at [facility], [address] [place], [place] [address]  Protein, body fluid (other)     Status: None   Collection Time: 12/25/21 10:17 AM  Result Value Ref Range   Total Protein, Body Fluid Other 2.1 g/dL    Comment: (NOTE)             _________________________________________            : BODY FLUID TYPE :     TOTAL PROTEIN     :            :_________________:_______________________:            : Amniotic Fluid  :               <0.4    :            :_________________:_______________________:            :                 : Nonmalignant: <3.0    :            : Ascitic Fluid   : Malignant:    >3.0    :            :_________________:_______________________:            : Bile, Clear     :               <0.9    :            :_________________:_______________________:            : Bile, Yellow    :          0.2 - 0.6    :            :_________________:_______________________:            : Lymph           :          2.2 - 6.0    :            :_________________:_______________________:            : Human Milk      :          1.9 - 2.0    :            :_________________: ______________________:            : Nasal Secretion :          0.1 - 3.5    :            :_________________:___________ ____________:            : Pancreatic      :          0.0 - 0.1    :            : Juice           :    (post stimulation) :            :_________________:_______________________:            :                  :  Transudate:   <0.3    :            : Pleural Fluid   : Exudate:      >0.3    :            :_________________:_______________________:            : Saliva          :          0.1 - 0.2    :            : (Mixed Glands)  :                       :            :_________________:_______________________:            : Synovial Fluid  :               <2.5    :            :_________________:_______________________:            : Tears           :          0.8 - 0.9    :            :_________________:_______________________:             Louanne Skye, Karene Fry Reference Intervals             for  Adults and Children 2008. Ninth             Edition (V9.1) [place],             [place]; [place]: July 2009. Performed At: [facility] [place], [place] [id number] Rush Farmer MD JJ:[id number]    Source of Sample PLEURAL     Comment: Performed at [facility], [address] [place], [place] [address]  CUP [place]     Status: None   Collection Time: 12/27/21  7:32 AM  Result Value Ref Range   Date Time Interrogation Session [id number]    Pulse Generator Manufacturer MERM    Pulse Gen Model [id number] Micra AV    Pulse Gen Serial Number [id number]    Clinic Name [facility]    Implantable Pulse Generator Type Implantable Pulse Generator    Implantable Pulse Generator Implant Date [id number]    Lead Channel Setting Sensing Sensitivity 2.8 mV   Lead Channel Setting Pacing Pulse Width 0.24 ms   Lead Channel Setting Pacing Amplitude 1.5 V   Lead Channel Impedance Value 440 ohm   Lead Channel Sensing Intrinsic Amplitude 14.738 mV   Lead Channel Pacing Threshold Amplitude 0.875 V   Lead Channel Pacing Threshold Pulse Width 0.24 ms   Battery Status OK    Battery Remaining Longevity 65 mo   Battery Voltage 2.97 V   Brady Statistic RV Percent Paced 99.66 %   Brady Statistic AS VP Percent 80.33 %   Brady Statistic AS VS  Percent 6.07 %  Basic metabolic panel     Status: Abnormal   Collection Time: 01/16/22  9:18 AM  Result Value Ref Range   Glucose 93 70 - 99 mg/dL   BUN 45 (H) 8 - 27 mg/dL   Creatinine, Ser 1.77 (H) 0.57 - 1.00 mg/dL   eGFR 28 (L) >59 mL/min/1.73  BUN/Creatinine Ratio 25 12 - 28   Sodium 137 134 - 144 mmol/L   Potassium 5.1 3.5 - 5.2 mmol/L   Chloride 99 96 - 106 mmol/L   CO2 23 20 - 29 mmol/L   Calcium 9.5 8.7 - 10.3 mg/dL  Pro b natriuretic peptide     Status: Abnormal   Collection Time: 01/16/22  9:18 AM  Result Value Ref Range   NT-Pro BNP 6,035 (H) 0 - 738 pg/mL    Comment: The following cut-points have been suggested for the use of proBNP for the diagnostic evaluation of heart failure (HF) in patients with acute dyspnea: Modality                     Age           Optimal Cut                            (years)            Point ------------------------------------------------------ Diagnosis (rule in HF)        <50            450 pg/mL                           50 - 75            900 pg/mL                               >75           1800 pg/mL Exclusion (rule out HF)  Age independent     300 pg/mL   Basic metabolic panel     Status: Abnormal   Collection Time: 01/25/22 10:11 AM  Result Value Ref Range   Glucose 120 (H) 70 - 99 mg/dL   BUN 76 (HH) 8 - 27 mg/dL   Creatinine, Ser 2.27 (H) 0.57 - 1.00 mg/dL   eGFR 21 (L) >59 mL/min/1.73   BUN/Creatinine Ratio 33 (H) 12 - 28   Sodium 138 134 - 144 mmol/L   Potassium 3.5 3.5 - 5.2 mmol/L   Chloride 95 (L) 96 - 106 mmol/L   CO2 26 20 - 29 mmol/L   Calcium 9.4 8.7 - 10.3 mg/dL  Pro b natriuretic peptide     Status: Abnormal   Collection Time: 01/25/22 10:11 AM  Result Value Ref Range   NT-Pro BNP 4,611 (H) 0 - 738 pg/mL    Comment: The following cut-points have been suggested for the use of proBNP for the diagnostic evaluation of heart failure (HF) in patients with acute dyspnea: Modality                     Age            Optimal Cut                            (years)            Point ------------------------------------------------------ Diagnosis (rule in HF)        <50            450 pg/mL  50 - 75            900 pg/mL                               >75           1800 pg/mL Exclusion (rule out HF)  Age independent     300 pg/mL   Basic metabolic panel     Status: Abnormal   Collection Time: 02/05/22  2:05 PM  Result Value Ref Range   Glucose 101 (H) 70 - 99 mg/dL   BUN 34 (H) 8 - 27 mg/dL   Creatinine, Ser 1.31 (H) 0.57 - 1.00 mg/dL   eGFR 40 (L) >59 mL/min/1.73   BUN/Creatinine Ratio 26 12 - 28   Sodium 141 134 - 144 mmol/L   Potassium 4.3 3.5 - 5.2 mmol/L   Chloride 105 96 - 106 mmol/L   CO2 23 20 - 29 mmol/L   Calcium 8.8 8.7 - 10.3 mg/dL  ECHOCARDIOGRAM COMPLETE     Status: None   Collection Time: 02/05/22  3:20 PM  Result Value Ref Range   Area-P 1/2 3.67 cm2   S' Lateral 2.50 cm   AV Area mean vel 0.89 cm2   AR max vel 0.88 cm2   AV Area VTI 0.82 cm2   Ao pk vel 2.16 m/s   AV Mean grad 9.4 mmHg   AV Peak grad 18.7 mmHg    Radiology ECHOCARDIOGRAM COMPLETE  Result Date: 02/05/2022    ECHOCARDIOGRAM REPORT   Patient Name:   Avalynne ASHYAH QUIZON Date of Exam: 02/05/2022 Medical Rec #:  [id number]        Height:       61.0 in Accession #:    [id number]       Weight:       102.2 lb Date of Birth:  1936-01-13        BSA:          1.420 m Patient Age:    82 years         BP:           110/56 mmHg Patient Gender: F                HR:           105 bpm. Exam Location:  [address] Procedure: 2D Echo, Cardiac Doppler and Color Doppler Indications:    Z95.2 S/pTAVR  History:        Patient has prior history of Echocardiogram examinations, most                 recent 11/26/2019. CHF, CAD, Pacemaker, Stroke, S/p TAVR (48m                 CoreValve Evolut Pro); Risk Factors:Hypertension, Diabetes and                 Dyslipidemia.                 Aortic Valve: 23 mm  CoreValve-Evolut Pro prosthetic, stented                 (TAVR) valve is present in the aortic position. Procedure Date:                 12/02/2018.  Sonographer:    WCoralyn HellingRDCS Referring Phys: [id number] 1. Left ventricular ejection fraction, by  estimation, is 60 to 65%. The left ventricle has normal function. The left ventricle has no regional wall motion abnormalities. There is mild left ventricular hypertrophy. Left ventricular diastolic parameters are indeterminate.  2. Right ventricular systolic function is normal. The right ventricular size is normal. There is mildly elevated pulmonary artery systolic pressure. The estimated right ventricular systolic pressure is 65.7 mmHg.  3. Left atrial size was moderately dilated.  4. The mitral valve is normal in structure. Mild mitral valve regurgitation. No evidence of mitral stenosis. Severe mitral annular calcification.  5. The aortic valve has been repaired/replaced. Aortic valve regurgitation is not visualized. No aortic stenosis is present. There is a 23 mm CoreValve-Evolut Pro prosthetic (TAVR) valve present in the aortic position. Procedure Date: 12/02/2018. Echo findings are consistent with normal structure and function of the aortic valve prosthesis. Aortic valve mean gradient measures 9.4 mmHg. Aortic valve Vmax measures 2.16 m/s.  6. The inferior vena cava is normal in size with greater than 50% respiratory variability, suggesting right atrial pressure of 3 mmHg. Comparison(s): No significant change from prior study. FINDINGS  Left Ventricle: Left ventricular ejection fraction, by estimation, is 60 to 65%. The left ventricle has normal function. The left ventricle has no regional wall motion abnormalities. The left ventricular internal cavity size was normal in size. There is  mild left ventricular hypertrophy. Left ventricular diastolic parameters are indeterminate.  LV Wall Scoring: The mid and distal anterior wall,  mid and distal anterior septum, and entire apex are akinetic. Right Ventricle: The right ventricular size is normal. No increase in right ventricular wall thickness. Right ventricular systolic function is normal. There is mildly elevated pulmonary artery systolic pressure. The tricuspid regurgitant velocity is 3.22  m/s, and with an assumed right atrial pressure of 3 mmHg, the estimated right ventricular systolic pressure is 84.6 mmHg. Left Atrium: Left atrial size was moderately dilated. Right Atrium: Right atrial size was normal in size. Pericardium: There is no evidence of pericardial effusion. Mitral Valve: The mitral valve is normal in structure. There is moderate thickening of the mitral valve leaflet(s). There is moderate calcification of the mitral valve leaflet(s). Severe mitral annular calcification. Mild mitral valve regurgitation. No evidence of mitral valve stenosis. Tricuspid Valve: The tricuspid valve is normal in structure. Tricuspid valve regurgitation is mild . No evidence of tricuspid stenosis. Aortic Valve: The aortic valve has been repaired/replaced. Aortic valve regurgitation is not visualized. No aortic stenosis is present. Aortic valve mean gradient measures 9.4 mmHg. Aortic valve peak gradient measures 18.7 mmHg. Aortic valve area, by VTI  measures 0.82 cm. There is a 23 mm CoreValve-Evolut Pro prosthetic, stented (TAVR) valve present in the aortic position. Procedure Date: 12/02/2018. Echo findings are consistent with normal structure and function of the aortic valve prosthesis. Pulmonic Valve: The pulmonic valve was normal in structure. Pulmonic valve regurgitation is mild. No evidence of pulmonic stenosis. Aorta: The aortic root is normal in size and structure. Venous: The inferior vena cava is normal in size with greater than 50% respiratory variability, suggesting right atrial pressure of 3 mmHg. IAS/Shunts: No atrial level shunt detected by color flow Doppler.  LEFT VENTRICLE PLAX 2D  LVIDd:         3.70 cm LVIDs:         2.50 cm LV PW:         1.20 cm LV IVS:        1.00 cm LVOT diam:     1.70 cm LV SV:  36 LV SV Index:   26 LVOT Area:     2.27 cm  RIGHT VENTRICLE            IVC RVSP:           44.5 mmHg  IVC diam: 1.20 cm LEFT ATRIUM             Index        RIGHT ATRIUM           Index LA diam:        4.30 cm 3.03 cm/m   RA Pressure: 3.00 mmHg LA Vol (A2C):   47.1 ml 33.17 ml/m  RA Area:     14.80 cm LA Vol (A4C):   70.9 ml 49.93 ml/m  RA Volume:   36.60 ml  25.78 ml/m LA Biplane Vol: 61.5 ml 43.31 ml/m  AORTIC VALVE AV Area (Vmax):    0.88 cm AV Area (Vmean):   0.89 cm AV Area (VTI):     0.82 cm AV Vmax:           216.40 cm/s AV Vmean:          140.400 cm/s AV VTI:            0.446 m AV Peak Grad:      18.7 mmHg AV Mean Grad:      9.4 mmHg LVOT Vmax:         83.54 cm/s LVOT Vmean:        55.200 cm/s LVOT VTI:          0.161 m LVOT/AV VTI ratio: 0.36  AORTA Ao Root diam: 2.90 cm MITRAL VALVE                TRICUSPID VALVE MV Area (PHT): 3.67 cm     TR Peak grad:   41.5 mmHg MV Decel Time: 207 msec     TR Vmax:        322.00 cm/s MV E velocity: 167.00 cm/s  Estimated RAP:  3.00 mmHg                             RVSP:           44.5 mmHg                              SHUNTS                             Systemic VTI:  0.16 m                             Systemic Diam: 1.70 cm Candee Furbish MD Electronically signed by Candee Furbish MD Signature Date/Time: 02/05/2022/3:33:07 PM    Final     Assessment/Plan Type 2 diabetes mellitus (HCC) blood glucose control important in reducing the progression of atherosclerotic disease. Also, involved in wound healing. On appropriate medications.   BP (high blood pressure) blood pressure control important in reducing the progression of atherosclerotic disease. On appropriate oral medications.  Carotid stenosis Her carotid duplex today reveals slight progression of her right carotid artery stenosis now following just into the 40 to 59% range  and stable left carotid artery stenosis also in the 40 to 59% range. No role for intervention at this level.  Continue current  medical regimen with aspirin.  Intolerant to statins.  Recheck in 1 year.  PAD (peripheral artery disease) (HCC) ABIs are 1.05 on the right and 1.01 on the left.  Some calcific atherosclerotic peripheral arterial disease is present without significant flow limitation at this point.  No role for intervention.  On aspirin.  Intolerant to statins.  Recheck in 1 year.    Leotis Pain, MD  02/23/2022 6:41 PM    This note was created with Dragon medical transcription system.  Any errors from dictation are purely unintentional

## 2022-02-23 NOTE — Assessment & Plan Note (Signed)
ABIs are 1.05 on the right and 1.01 on the left.  Some calcific atherosclerotic peripheral arterial disease is present without significant flow limitation at this point.  No role for intervention.  On aspirin.  Intolerant to statins.  Recheck in 1 year.

## 2022-03-06 NOTE — Telephone Encounter (Signed)
Oral Oncology Patient Advocate Encounter   Submitted application for assistance for Promacta to [employer].   Application submitted via e-fax to [facility]' phone number [phone].   I will continue to check the status until final determination.   Berdine Addison, [facility] Patient [place]  [phone] (phone) [phone] (fax) 03/06/2022 10:03 AM

## 2022-03-06 NOTE — Telephone Encounter (Addendum)
Oral Oncology Patient Advocate Encounter   Received notification re-enrollment for assistance for Promacta through [employer] has been approved. Patient may continue to receive their medication at $0 from this program.    [employer]' phone number [phone].   Effective dates: 01.01.24 through 12.31.24  I have spoken with the patient.    Stacy Cook, [facility] Patient [place]  [phone] (phone) [phone] (fax) 03/06/2022 4:23 PM

## 2022-03-07 ENCOUNTER — Inpatient Hospital Stay: Payer: Medicare PPO | Attending: Internal Medicine

## 2022-03-07 ENCOUNTER — Encounter: Payer: Self-pay | Admitting: Internal Medicine

## 2022-03-07 ENCOUNTER — Inpatient Hospital Stay: Payer: Medicare PPO

## 2022-03-07 ENCOUNTER — Inpatient Hospital Stay: Payer: Medicare PPO | Admitting: Internal Medicine

## 2022-03-07 VITALS — BP 155/75 | HR 72 | Temp 97.7°F | Resp 16 | Wt 104.8 lb

## 2022-03-07 DIAGNOSIS — J90 Pleural effusion, not elsewhere classified: Secondary | ICD-10-CM | POA: Insufficient documentation

## 2022-03-07 DIAGNOSIS — E876 Hypokalemia: Secondary | ICD-10-CM | POA: Insufficient documentation

## 2022-03-07 DIAGNOSIS — E1122 Type 2 diabetes mellitus with diabetic chronic kidney disease: Secondary | ICD-10-CM | POA: Insufficient documentation

## 2022-03-07 DIAGNOSIS — D693 Immune thrombocytopenic purpura: Secondary | ICD-10-CM | POA: Diagnosis not present

## 2022-03-07 DIAGNOSIS — I129 Hypertensive chronic kidney disease with stage 1 through stage 4 chronic kidney disease, or unspecified chronic kidney disease: Secondary | ICD-10-CM | POA: Insufficient documentation

## 2022-03-07 DIAGNOSIS — C160 Malignant neoplasm of cardia: Secondary | ICD-10-CM | POA: Insufficient documentation

## 2022-03-07 DIAGNOSIS — M879 Osteonecrosis, unspecified: Secondary | ICD-10-CM | POA: Insufficient documentation

## 2022-03-07 DIAGNOSIS — C3431 Malignant neoplasm of lower lobe, right bronchus or lung: Secondary | ICD-10-CM | POA: Diagnosis present

## 2022-03-07 DIAGNOSIS — Z8673 Personal history of transient ischemic attack (TIA), and cerebral infarction without residual deficits: Secondary | ICD-10-CM | POA: Insufficient documentation

## 2022-03-07 DIAGNOSIS — N1330 Unspecified hydronephrosis: Secondary | ICD-10-CM | POA: Diagnosis not present

## 2022-03-07 DIAGNOSIS — Z950 Presence of cardiac pacemaker: Secondary | ICD-10-CM | POA: Diagnosis not present

## 2022-03-07 DIAGNOSIS — R0602 Shortness of breath: Secondary | ICD-10-CM | POA: Diagnosis not present

## 2022-03-07 DIAGNOSIS — N184 Chronic kidney disease, stage 4 (severe): Secondary | ICD-10-CM | POA: Diagnosis not present

## 2022-03-07 LAB — CBC WITH DIFFERENTIAL/PLATELET
Abs Immature Granulocytes: 0.02 10*3/uL (ref 0.00–0.07)
Basophils Absolute: 0 10*3/uL (ref 0.0–0.1)
Basophils Relative: 1 %
Eosinophils Absolute: 0 10*3/uL (ref 0.0–0.5)
Eosinophils Relative: 1 %
HCT: 34.3 % — ABNORMAL LOW (ref 36.0–46.0)
Hemoglobin: 11 g/dL — ABNORMAL LOW (ref 12.0–15.0)
Immature Granulocytes: 1 %
Lymphocytes Relative: 21 %
Lymphs Abs: 0.8 10*3/uL (ref 0.7–4.0)
MCH: 28.8 pg (ref 26.0–34.0)
MCHC: 32.1 g/dL (ref 30.0–36.0)
MCV: 89.8 fL (ref 80.0–100.0)
Monocytes Absolute: 0.4 10*3/uL (ref 0.1–1.0)
Monocytes Relative: 10 %
Neutro Abs: 2.7 10*3/uL (ref 1.7–7.7)
Neutrophils Relative %: 66 %
Platelets: 116 10*3/uL — ABNORMAL LOW (ref 150–400)
RBC: 3.82 MIL/uL — ABNORMAL LOW (ref 3.87–5.11)
RDW: 13.2 % (ref 11.5–15.5)
WBC: 3.9 10*3/uL — ABNORMAL LOW (ref 4.0–10.5)
nRBC: 0 % (ref 0.0–0.2)

## 2022-03-07 LAB — COMPREHENSIVE METABOLIC PANEL
ALT: 10 U/L (ref 0–44)
AST: 19 U/L (ref 15–41)
Albumin: 3.6 g/dL (ref 3.5–5.0)
Alkaline Phosphatase: 94 U/L (ref 38–126)
Anion gap: 10 (ref 5–15)
BUN: 29 mg/dL — ABNORMAL HIGH (ref 8–23)
CO2: 25 mmol/L (ref 22–32)
Calcium: 8.8 mg/dL — ABNORMAL LOW (ref 8.9–10.3)
Chloride: 102 mmol/L (ref 98–111)
Creatinine, Ser: 1.32 mg/dL — ABNORMAL HIGH (ref 0.44–1.00)
GFR, Estimated: 40 mL/min — ABNORMAL LOW (ref >60)
Glucose, Bld: 110 mg/dL — ABNORMAL HIGH (ref 70–99)
Potassium: 4.4 mmol/L (ref 3.5–5.1)
Sodium: 137 mmol/L (ref 135–145)
Total Bilirubin: 0.3 mg/dL (ref 0.3–1.2)
Total Protein: 6.8 g/dL (ref 6.5–8.1)

## 2022-03-07 LAB — BRAIN NATRIURETIC PEPTIDE: B Natriuretic Peptide: 813 pg/mL — ABNORMAL HIGH (ref 0.0–100.0)

## 2022-03-07 NOTE — Progress Notes (Signed)
[place] OFFICE PROGRESS NOTE  Patient Care Team: Adin Hector, MD as PCP - General (Internal Medicine) [name], MD as PCP - Electrophysiology (Cardiology) Cammie Sickle, MD as Consulting Physician (Internal Medicine) Efrain Sella, MD as Consulting Physician (Gastroenterology)   Cancer Staging  Primary malignant neoplasm of right lower lobe of lung [facility]) Staging form: Lung, AJCC 7th Edition - Clinical: No stage assigned - Unsigned    Oncology History Overview Note  # DEC 2017- Adeno ca [GATA; her 2 Neu-NEG]; signet ring [1.5 x3 mm gastric incisura; Dr.Skulskie]; EUS [Dr.Burnbridge]; no significant abnormality noted;  Reviewed at [facility] also. JAN 2018- PET NED. April 2018- S/p partial gastrectomy [Dr.Sankar]- STAGE I ADENO CA; NO adjuvant therapy.  # STAGE I CARCINOID s/p partial gastrectomy   # May 2018- Chronic Atrophic gastritis- Prevpac   # FEB 2017- ADENOCARCINOMA with Lepidic 80%-20% acinar pattern; pT2a [Stage IB;T-2.3cm; visceral pleural invasion present; pN=0 ]; AUG 2017- CT NED;   # DEC 2019- RECURRENT/STAGE IV ADENO LUNG CA- EGFR MUTATED;# Jan 6th 2020-; START Osidemrtinib;  # AUG 2020- SEVERE AS [s/p TAVR; [place]]-complicated by R sided stroke/ seizures/acute renal failure.   # DEC 2nd 2020- START PROMACTA 25 mg/day [ITP]  # Molecular testing: EFGR mutated E332R [omniseq]  # Palliative: O-1/20   DIAGNOSIS:  #ADENO CA LUNG-STAGE IV #Stomach adeno ca [stage I; dec 2017]  GOALS: pallaitive  CURRENT/MOST RECENT THERAPY - OSIMERTINIB [Jan 6th 2020]      Primary malignant neoplasm of right lower lobe of lung ([place])  12/02/2015 Initial Diagnosis   Primary malignant neoplasm of right lower lobe of lung (HCC)   Adenocarcinoma of gastric cardia (HCC)   INTERVAL HISTORY: Alone.  Ambulating independantly.   Stacy Cook 86 y.o.  female pleasant patient above history of recurrent/stage IV adenocarcinoma lung/EGFR mutated  currently on osimertinib is here for follow-up.   At the last visit patient was referred to cardiology regarding worsening right pleural pleural effusion-thought to be from fluid overload. However noted to have dehydration/ ARF on lasix. Currently off lasix.   No hemoptysis.  Denies any worsening swelling of the legs. No history of ongoing intermittent abdominal pain right lower quadrant.  Currently pain resolved.   Review of Systems  Constitutional:  Positive for malaise/fatigue. Negative for chills, diaphoresis and fever.  HENT:  Negative for nosebleeds and sore throat.   Eyes:  Negative for double vision.  Respiratory:  Negative for hemoptysis, sputum production and wheezing.   Cardiovascular:  Negative for chest pain, palpitations, orthopnea and leg swelling.  Gastrointestinal:  Negative for blood in stool, constipation, melena, nausea and vomiting.  Genitourinary:  Negative for dysuria, frequency and urgency.  Musculoskeletal:  Positive for back pain and joint pain.  Neurological:  Positive for focal weakness. Negative for dizziness, tingling, weakness and headaches.  Endo/Heme/Allergies:  Bruises/bleeds easily.  Psychiatric/Behavioral:  Negative for depression. The patient is not nervous/anxious and does not have insomnia.       PAST MEDICAL HISTORY :  Past Medical History:  Diagnosis Date   Abdominal pain    Acid reflux 03/24/2015   Adenocarcinoma of gastric cardia ([place]) 04/10/2016   Allergic genetic state    Anemia    Arteriosclerosis of coronary artery 03/24/2015   Arthritis    "back, hands" (10/15/2017)   Asthma    B12 deficiency anemia    Basal cell carcinoma 01/01/2022   glabella, EDC   Bile reflux gastritis    CAD (coronary artery  disease)    Cancer of right lung ([place]) 05/09/2015   Dr. Genevive Bi performed Right lower lobe lobectomy.    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 08/03/2016   Carotid stenosis 02/07/2016   Cataract cortical, senile    Chest pain    Colon  polyp    Degeneration of intervertebral disc of lumbar region 03/11/2014   Diabetes ([place])    Gastric adenocarcinoma (HCC)    GERD (gastroesophageal reflux disease)    also, history of ulcers   History of kidney stones    History of stroke    HLD (hyperlipidemia) 08/24/2013   Hypertension    Malignant tumor of stomach ([place]) 07/2016   Adenocarcinoma, diffuse, poorly differentiated, signet ring, stage I   Neuritis or radiculitis due to rupture of lumbar intervertebral disc 09/10/2014   Neuroendocrine tumor 03/24/2015   Neuroma    Osteoporosis    Peripheral vascular disease (HCC)    PONV (postoperative nausea and vomiting)    Presence of permanent cardiac pacemaker 12/09/2018   Primary malignant neoplasm of right lower lobe of lung ([place]) 12/02/2015   Renal insufficiency    Renal stone    S/P TAVR (transcatheter aortic valve replacement)    Sciatica    Severe aortic stenosis    Skin cancer    "cut/burned LUE; cut off right eye/nose & cut off chest" (10/15/2017)   Stomach ulcer    Thrombocytopenia ([place])    Type 2 diabetes, diet controlled ([place])    "no RX since stomach OR 07/2016" (10/15/2017)    PAST SURGICAL HISTORY :   Past Surgical History:  Procedure Laterality Date   APPENDECTOMY     CARDIAC CATHETERIZATION  X2 before 10/15/2017   CARDIAC VALVE REPLACEMENT     CATARACT EXTRACTION W/PHACO Left 04/04/2017   Procedure: CATARACT EXTRACTION PHACO AND INTRAOCULAR LENS PLACEMENT ([place]);  Surgeon: Leandrew Koyanagi, MD;  Location: [facility] ORS;  Service: Ophthalmology;  Laterality: Left;  Lot # [id number] H [place] 1:00 Ap 25% CDE 8.54   CATARACT EXTRACTION W/PHACO Right 05/15/2017   Procedure: CATARACT EXTRACTION PHACO AND INTRAOCULAR LENS PLACEMENT ([facility]);  Surgeon: Leandrew Koyanagi, MD;  Location: [facility]S;  Service: Ophthalmology;  Laterality: Right;  [place] 01:10 AP% 18.3 CDE 12.91 Fluid pack lot # [id number]   COLONOSCOPY     CORONARY ATHERECTOMY N/A 10/15/2017   Procedure: CORONARY  ATHERECTOMY;  Surgeon: Burnell Blanks, MD;  Location: [place] [facility] LAB;  Service: Cardiovascular;  Laterality: N/A;   CORONARY STENT INTERVENTION N/A 10/15/2017   Procedure: CORONARY STENT INTERVENTION;  Surgeon: Burnell Blanks, MD;  Location: [place] [facility];  Service: Cardiovascular;  Laterality: N/A;   CYSTOSCOPY/URETEROSCOPY/HOLMIUM LASER/STENT PLACEMENT Left 08/03/2019   Procedure: CYSTOSCOPY/URETEROSCOPY/HOLMIUM LASER/STENT PLACEMENT;  Surgeon: Hollice Espy, MD;  Location: [facility];  Service: Urology;  Laterality: Left;   ESOPHAGOGASTRODUODENOSCOPY     ESOPHAGOGASTRODUODENOSCOPY (EGD) WITH PROPOFOL N/A 03/27/2016   Procedure: ESOPHAGOGASTRODUODENOSCOPY (EGD) WITH PROPOFOL;  Surgeon: Lollie Sails, MD;  Location: [facility] ENDOSCOPY;  Service: Endoscopy;  Laterality: N/A;   ESOPHAGOGASTRODUODENOSCOPY (EGD) WITH PROPOFOL N/A 05/28/2016   Procedure: ESOPHAGOGASTRODUODENOSCOPY (EGD) WITH PROPOFOL;  Surgeon: Lollie Sails, MD;  Location: [facility] ENDOSCOPY;  Service: Endoscopy;  Laterality: N/A;   ESOPHAGOGASTRODUODENOSCOPY (EGD) WITH PROPOFOL N/A 09/25/2016   Procedure: ESOPHAGOGASTRODUODENOSCOPY (EGD) WITH PROPOFOL;  Surgeon: Christene Lye, MD;  Location: [facility] ENDOSCOPY;  Service: Endoscopy;  Laterality: N/A;   ESOPHAGOGASTRODUODENOSCOPY (EGD) WITH PROPOFOL N/A 12/18/2016   Procedure: ESOPHAGOGASTRODUODENOSCOPY (EGD) WITH PROPOFOL;  Surgeon: Christene Lye, MD;  Location: [facility] ENDOSCOPY;  Service: Endoscopy;  Laterality: N/A;   ESOPHAGOGASTRODUODENOSCOPY (EGD) WITH PROPOFOL N/A 01/23/2017   Procedure: ESOPHAGOGASTRODUODENOSCOPY (EGD) WITH PROPOFOL;  Surgeon: Christene Lye, MD;  Location: [facility] ENDOSCOPY;  Service: Endoscopy;  Laterality: N/A;   ESOPHAGOGASTRODUODENOSCOPY (EGD) WITH PROPOFOL N/A 03/20/2017   Procedure: ESOPHAGOGASTRODUODENOSCOPY (EGD) WITH PROPOFOL;  Surgeon: Christene Lye, MD;  Location: [facility] ENDOSCOPY;  Service:  Endoscopy;  Laterality: N/A;   ESOPHAGOGASTRODUODENOSCOPY (EGD) WITH PROPOFOL N/A 09/23/2020   Procedure: ESOPHAGOGASTRODUODENOSCOPY (EGD) WITH PROPOFOL;  Surgeon: Lesly Rubenstein, MD;  Location: [facility] ENDOSCOPY;  Service: Endoscopy;  Laterality: N/A;  [place] SAYS "AMPICILLIN IS NEEDED"   FRACTURE SURGERY     INSERT / REPLACE / REMOVE PACEMAKER  12/09/2018   PACEMAKER LEADLESS INSERTION N/A 12/09/2018   Procedure: PACEMAKER LEADLESS INSERTION;  Surgeon: [name], MD;  Location: [place] [facility];  Service: Cardiovascular;  Laterality: N/A;   PARTIAL GASTRECTOMY N/A 08/03/2016   Hemigastrectomy, [place]th I reconstruction Surgeon: Christene Lye, MD;  Location: [facility] ORS;  Service: General;  Laterality: N/A;   RIGHT/LEFT HEART CATH AND CORONARY ANGIOGRAPHY Bilateral 09/19/2017   Procedure: RIGHT/LEFT HEART CATH AND CORONARY ANGIOGRAPHY;  Surgeon: Yolonda Kida, MD;  Location: [place] [facility];  Service: Cardiovascular;  Laterality: Bilateral;   SHOULDER ARTHROSCOPY W/ CAPSULAR REPAIR Right    SKIN CANCER EXCISION     "cut/burned LUE; cut off right eye/nose & cut off chest" (10/15/2017)   TEE WITHOUT CARDIOVERSION N/A 12/02/2018   Procedure: TRANSESOPHAGEAL ECHOCARDIOGRAM (TEE);  Surgeon: Burnell Blanks, MD;  Location: [place] [facility];  Service: Open Heart Surgery;  Laterality: N/A;   THORACOTOMY Right 05/09/2015   Procedure: THORACOTOMY, RIGHT LOWER LOBECTOMY, BRONCHOSCOPY;  Surgeon: Nestor Lewandowsky, MD;  Location: [facility] ORS;  Service: Thoracic;  Laterality: Right;   TONSILLECTOMY  1944   TRANSCATHETER AORTIC VALVE REPLACEMENT, TRANSFEMORAL N/A 12/02/2018   Procedure: TRANSCATHETER AORTIC VALVE REPLACEMENT, TRANSFEMORAL;  Surgeon: Burnell Blanks, MD;  Location: [place] [facility] LAB;  Service: Open Heart Surgery;  Laterality: N/A;   TUBAL LIGATION     UPPER GI ENDOSCOPY N/A 08/03/2016   Procedure: UPPER  ENDOSCOPY;  Surgeon: Christene Lye, MD;  Location:  [facility] ORS;  Service: General;  Laterality: N/A;   VAGINAL HYSTERECTOMY     WRIST FRACTURE SURGERY Right     FAMILY HISTORY :   Family History  Problem Relation Age of Onset   Aortic aneurysm Mother    Kidney Stones Mother    Hypertension Mother    Hypertension Father    Diabetes Other    Breast cancer Neg Hx    Prostate cancer Neg Hx    Bladder Cancer Neg Hx    Kidney cancer Neg Hx     SOCIAL HISTORY:   Social History   Tobacco Use   Smoking status: Never   Smokeless tobacco: Never  Vaping Use   Vaping Use: Never used  Substance Use Topics   Alcohol use: Not Currently   Drug use: Never    ALLERGIES:  is allergic to mirtazapine, pneumococcal vaccine, statins, and sulfa antibiotics.  MEDICATIONS:  Current Outpatient Medications  Medication Sig Dispense Refill   aspirin EC 81 MG tablet Take 81 mg by mouth daily.     cholecalciferol (VITAMIN D) 1000 UNITS tablet Take 1,000 Units by mouth daily.     cyanocobalamin 1000 MCG tablet Take 1,000 mcg by mouth daily.      eltrombopag (PROMACTA) 25 MG tablet TAKE 1 TABLET  BY MOUTH DAILY. TAKE ON AN EMPTY STOMACH, 1 HOUR BEFORE A MEAL OR 2 HOURS AFTER. 30 tablet 6   ezetimibe (ZETIA) 10 MG tablet Take 10 mg by mouth daily.     isosorbide mononitrate (IMDUR) 30 MG 24 hr tablet Take 1 tablet by mouth once daily 90 tablet 3   Magnesium 100 MG TABS Take by mouth.     metoprolol succinate (TOPROL-XL) 50 MG 24 hr tablet Take 1 tablet (50 mg total) by mouth every evening. Take with or immediately following a meal. 30 tablet 0   osimertinib mesylate (TAGRISSO) 80 MG tablet Take 1 tablet (80 mg total) by mouth daily. 30 tablet 5   potassium citrate (UROCIT-K) 10 MEQ (1080 MG) SR tablet Take 1 tablet (10 mEq total) by mouth daily. 90 tablet 0   furosemide (LASIX) 20 MG tablet Take 1 tablet (20 mg total) by mouth daily. Will start taking this on 01/30/22 for lasix is being held x 3 days. 90 tablet 1   No current facility-administered  medications for this visit.    PHYSICAL EXAMINATION: ECOG PERFORMANCE STATUS: 0 - Asymptomatic  BP (!) 155/75 (BP Location: Left Arm, Patient Position: Sitting)   Pulse 72   Temp 97.7 F (36.5 C) (Tympanic)   Resp 16   Wt 104 lb 12.8 oz (47.5 kg)   BMI 19.80 kg/m   Filed Weights   03/07/22 1300  Weight: 104 lb 12.8 oz (47.5 kg)   Decreased breath sounds on the right side compared to left.  Physical Exam Constitutional:      Comments: Patient walking by herself; no assistive devices.  Alone.  HENT:     Head: Normocephalic and atraumatic.     Mouth/Throat:     Pharynx: No oropharyngeal exudate.  Eyes:     Pupils: Pupils are equal, round, and reactive to light.  Cardiovascular:     Rate and Rhythm: Normal rate and regular rhythm.     Heart sounds: Murmur heard.  Pulmonary:     Effort: No respiratory distress.     Breath sounds: No wheezing.  Abdominal:     General: Bowel sounds are normal. There is no distension.     Palpations: Abdomen is soft. There is no mass.     Tenderness: There is no abdominal tenderness. There is no guarding or rebound.  Musculoskeletal:        General: No tenderness. Normal range of motion.     Cervical back: Normal range of motion and neck supple.  Skin:    General: Skin is warm.  Neurological:     Mental Status: She is alert and oriented to person, place, and time.     Comments: Weakness of the right upper extremity more than lower extremity.  Psychiatric:        Mood and Affect: Affect normal.    LABORATORY DATA:  I have reviewed the data as listed    Component Value Date/Time   NA 137 03/07/2022 1333   NA 141 02/05/2022 1405   NA 141 07/21/2012 1529   K 4.4 03/07/2022 1333   K 3.9 07/21/2012 1529   CL 102 03/07/2022 1333   CL 109 (H) 07/21/2012 1529   CO2 25 03/07/2022 1333   CO2 27 07/21/2012 1529   GLUCOSE 110 (H) 03/07/2022 1333   GLUCOSE 104 (H) 07/21/2012 1529   BUN 29 (H) 03/07/2022 1333   BUN 34 (H) 02/05/2022 1405    BUN 16 07/21/2012 1529   CREATININE 1.32 (  H) 03/07/2022 1333   CREATININE 1.11 09/03/2012 1535   CALCIUM 8.8 (L) 03/07/2022 1333   CALCIUM 8.9 07/21/2012 1529   PROT 6.8 03/07/2022 1333   PROT 7.1 07/21/2012 1529   ALBUMIN 3.6 03/07/2022 1333   ALBUMIN 3.9 07/21/2012 1529   AST 19 03/07/2022 1333   AST 26 07/21/2012 1529   ALT 10 03/07/2022 1333   ALT 22 07/21/2012 1529   ALKPHOS 94 03/07/2022 1333   ALKPHOS 76 07/21/2012 1529   BILITOT 0.3 03/07/2022 1333   BILITOT 0.3 07/21/2012 1529   GFRNONAA 40 (L) 03/07/2022 1333   GFRNONAA 49 (L) 09/03/2012 1535   GFRAA 29 (L) 01/05/2020 1440   GFRAA 57 (L) 09/03/2012 1535    No results found for: "SPEP", "UPEP"  Lab Results  Component Value Date   WBC 3.9 (L) 03/07/2022   NEUTROABS 2.7 03/07/2022   HGB 11.0 (L) 03/07/2022   HCT 34.3 (L) 03/07/2022   MCV 89.8 03/07/2022   PLT 116 (L) 03/07/2022      Chemistry      Component Value Date/Time   NA 137 03/07/2022 1333   NA 141 02/05/2022 1405   NA 141 07/21/2012 1529   K 4.4 03/07/2022 1333   K 3.9 07/21/2012 1529   CL 102 03/07/2022 1333   CL 109 (H) 07/21/2012 1529   CO2 25 03/07/2022 1333   CO2 27 07/21/2012 1529   BUN 29 (H) 03/07/2022 1333   BUN 34 (H) 02/05/2022 1405   BUN 16 07/21/2012 1529   CREATININE 1.32 (H) 03/07/2022 1333   CREATININE 1.11 09/03/2012 1535      Component Value Date/Time   CALCIUM 8.8 (L) 03/07/2022 1333   CALCIUM 8.9 07/21/2012 1529   ALKPHOS 94 03/07/2022 1333   ALKPHOS 76 07/21/2012 1529   AST 19 03/07/2022 1333   AST 26 07/21/2012 1529   ALT 10 03/07/2022 1333   ALT 22 07/21/2012 1529   BILITOT 0.3 03/07/2022 1333   BILITOT 0.3 07/21/2012 1529       RADIOGRAPHIC STUDIES: I have personally reviewed the radiological images as listed and agreed with the findings in the report. No results found.   ASSESSMENT & PLAN:  Primary malignant neoplasm of right lower lobe of lung ([place]) # Stage IV recurrent adenocarcinoma the lung; EGFR  mutated. On osimertinib 80 mg [Feb 6th 2020]. SEP 2023- CT CAP- non-contrast- Right lower lobectomy. 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 Associated pleural thickening without nodularity. New septal thickening throughout the right upper and right middle lobes as well as within the left lower lobe. New small left pleural effusion.    # currently on Osimertinib 80 mg/day; tolerating well.  Continue current therapy-no obvious evidence of recurrent malignancy at this time.  Thoracentesis negative any malignant cells.   #Right pleural effusion-status postthoracentesis-pleural fluid: Transudative; cytology negative for malignancy.  Question CHF/fluid overload.  Reached out to cardiology.  Sent MyChart message-also start the patient on Lasix 20 mg a day.  Recommend compliance with her potassium supplementation....  # RIGHT LOWER quadrant pain- ? Colitis- JUNE 2023-CT scan.  Currently on antibiotics-Per PCP improved not resolved.  Awaitingevaluation with GI [esp rectal thickening noted on CT sep 2023.].  # ITP- chronic- platelets> 100 on promacta 25 mg/day-  STABLE.   # Stomach cancer stage I status post gastrectomy; June 2022- S/p EGD- [facility]  GI.-  STABLE.    # right sided stroke/ seizures-on asprin; off plavix;; f/u neurology-[place]-STABLE.    # CKD- stage IV; Left Hydronephrosis s/p ureteral stent placement/nephrolithiasis-~ GFR 25 today; Hypokalemia-3.0 recommend re-starting on  K-citrate q day. STABLE.    #Incidental findings on Imaging  SEP 2023 2023: Difficult to exclude rectal wall thickening; Avascular necrosis of the femoral head; Aortic atherosclerosis (ICD10-I70.0). Coronary artery calcification. I reviewed/discussed/counseled the patient.   # IV ACCESS: PIV  # DISPOSITION: # ADD BNP to next visit  # keep appt as  planned in nov- Dr.B        Orders Placed This Encounter  Procedures   CT CHEST WO CONTRAST    Standing Status:   Future    Standing Expiration Date:   03/08/2023    Order Specific Question:   Preferred imaging location?    Answer:   [facility]    Order Specific Question:   Radiology Contrast Protocol - do NOT remove file path    Answer:   [email or web address]   All questions were answered. The patient knows to call the clinic with any problems, questions or concerns.      Cammie Sickle, MD 03/07/2022 2:17 PM

## 2022-03-07 NOTE — Progress Notes (Signed)
Patient denies new problems/concerns today.   °

## 2022-03-07 NOTE — Assessment & Plan Note (Addendum)
#  Stage IV recurrent adenocarcinoma the lung; EGFR mutated. On osimertinib 80 mg [Feb 6th 2020]. SEP 2023- CT CAP- non-contrast- Right lower lobectomy. 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 Associated pleural thickening without nodularity. New septal thickening throughout the right upper and right middle lobes as well as within the left lower lobe. New small left pleural effusion.    # currently on Osimertinib 80 mg/day; tolerating well.  Continue current therapy-no obvious evidence of recurrent malignancy at this time.  Thoracentesis negative any malignant cells. Will repeat CT scan in FEB 2023. Ordered today.   #Right pleural effusion-status postthoracentesis-pleural fluid: Transudative; cytology negative for malignancy.  Question CHF/fluid overload.  S/p Lasix 20 mg a day- improved/stable. Currently not on any lasix [ARF]   # RIGHT LOWER quadrant pain- ? Colitis- JUNE 2023-CT scan.  Currently on antibiotics-Per PCP improved not resolved.  Awaiting evaluation with GI [esp rectal thickening noted on CT sep 2023.]-STABLE.   # ITP- chronic- platelets> 100 on promacta 25 mg/day-  STABLE.   # Stomach cancer stage I status post gastrectomy; June 2022- S/p EGD- [facility].-   STABLE.    # right sided stroke/ seizures-on asprin; off plavix;; f/u neurology-[place]- STABLE.    # CKD- stage IV; Left Hydronephrosis s/p ureteral stent placement/nephrolithiasis-~ GFR 25 today; Hypokalemia-4.3 continue K-citrate q day.  STABLE.  # IV ACCESS: PIV  # DISPOSITION: # NO infusion # follow up in 2 months- MD: labs- cbc/cmp;LDH; iron studies.ferritin; CT chest prior-- Dr.B

## 2022-04-06 LAB — CUP PACEART REMOTE DEVICE CHECK
Battery Remaining Longevity: 65 mo
Battery Voltage: 2.96 V
Brady Statistic AS VP Percent: 79.94 %
Brady Statistic AS VS Percent: 0.02 %
Brady Statistic RV Percent Paced: 99.55 %
Date Time Interrogation Session: 20231229112800
Implantable Pulse Generator Implant Date: 20200901
Lead Channel Impedance Value: 450 Ohm
Lead Channel Pacing Threshold Amplitude: 0.875 V
Lead Channel Pacing Threshold Pulse Width: 0.24 ms
Lead Channel Sensing Intrinsic Amplitude: 4.05 mV
Lead Channel Setting Pacing Amplitude: 1.375
Lead Channel Setting Pacing Pulse Width: 0.24 ms
Lead Channel Setting Sensing Sensitivity: 2.8 mV
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
## 2022-04-16 ENCOUNTER — Ambulatory Visit: Payer: Medicare PPO | Admitting: Cardiovascular Disease

## 2022-04-17 ENCOUNTER — Ambulatory Visit: Payer: Medicare PPO | Admitting: Nurse Practitioner

## 2022-04-26 ENCOUNTER — Other Ambulatory Visit: Payer: Self-pay | Admitting: Cardiovascular Disease

## 2022-05-03 ENCOUNTER — Ambulatory Visit: Payer: Medicare PPO | Admitting: Dermatology

## 2022-05-03 VITALS — BP 156/64 | HR 69

## 2022-05-03 DIAGNOSIS — C44519 Basal cell carcinoma of skin of other part of trunk: Secondary | ICD-10-CM

## 2022-05-03 DIAGNOSIS — D229 Melanocytic nevi, unspecified: Secondary | ICD-10-CM

## 2022-05-03 DIAGNOSIS — D492 Neoplasm of unspecified behavior of bone, soft tissue, and skin: Secondary | ICD-10-CM

## 2022-05-03 DIAGNOSIS — Z85828 Personal history of other malignant neoplasm of skin: Secondary | ICD-10-CM | POA: Diagnosis not present

## 2022-05-03 DIAGNOSIS — L814 Other melanin hyperpigmentation: Secondary | ICD-10-CM | POA: Diagnosis not present

## 2022-05-03 DIAGNOSIS — L578 Other skin changes due to chronic exposure to nonionizing radiation: Secondary | ICD-10-CM | POA: Diagnosis not present

## 2022-05-03 DIAGNOSIS — D692 Other nonthrombocytopenic purpura: Secondary | ICD-10-CM

## 2022-05-03 DIAGNOSIS — L821 Other seborrheic keratosis: Secondary | ICD-10-CM

## 2022-05-03 DIAGNOSIS — Z1283 Encounter for screening for malignant neoplasm of skin: Secondary | ICD-10-CM

## 2022-05-03 DIAGNOSIS — C44612 Basal cell carcinoma of skin of right upper limb, including shoulder: Secondary | ICD-10-CM | POA: Diagnosis not present

## 2022-05-03 NOTE — Progress Notes (Signed)
Follow-Up Visit   Subjective  Stacy Cook is a 87 y.o. female who presents for the following: ISK (R ant deltoid, 52m f/u) and hx of BCC (Glabella, bx and [facility] 01/01/22).  The following portions of the chart were reviewed this encounter and updated as appropriate:   Tobacco  Allergies  Meds  Problems  Med Hx  Surg Hx  Fam Hx     Review of Systems:  No other skin or systemic complaints except as noted in HPI or Assessment and Plan.  Objective  Well appearing patient in no apparent distress; mood and affect are within normal limits.  All skin waist up examined.  glabella Well healed scar with no evidence of recurrence.   R ant deltoid 1.1cm crusted patch     Xyphoid 1.3cm ulcered patch      Assessment & Plan   Actinic Damage - chronic, secondary to cumulative UV radiation exposure/sun exposure over time - diffuse scaly erythematous macules with underlying dyspigmentation - Recommend daily broad spectrum sunscreen SPF 30+ to sun-exposed areas, reapply every 2 hours as needed.  - Recommend staying in the shade or wearing long sleeves, sun glasses (UVA+UVB protection) and wide brim hats (4-inch brim around the entire circumference of the hat). - Call for new or changing lesions.   Seborrheic Keratoses - Stuck-on, waxy, tan-brown papules and/or plaques  - Benign-appearing - Discussed benign etiology and prognosis. - Observe - Call for any changes  Purpura - Chronic; persistent and recurrent.  Treatable, but not curable. - Violaceous macules and patches - Benign - Related to trauma, age, sun damage and/or use of blood thinners, chronic use of topical and/or oral steroids - Observe - Can use OTC arnica containing moisturizer such as Dermend Bruise Formula if desired - Call for worsening or other concerns   History of basal cell carcinoma (BCC) glabella Clear. Observe for recurrence. Call clinic for new or changing lesions.  Recommend regular skin exams, daily  broad-spectrum spf 30+ sunscreen use, and photoprotection.    Neoplasm of skin (2) R ant deltoid Epidermal / dermal shaving  Lesion diameter (cm):  1.1 Informed consent: discussed and consent obtained   Timeout: patient name, date of birth, surgical site, and procedure verified   Procedure prep:  Patient was prepped and draped in usual sterile fashion Prep type:  Isopropyl alcohol Anesthesia: the lesion was anesthetized in a standard fashion   Anesthetic:  1% lidocaine w/ epinephrine 1-100,000 buffered w/ 8.4% NaHCO3 Instrument used: flexible razor blade   Hemostasis achieved with: pressure, aluminum chloride and electrodesiccation   Outcome: patient tolerated procedure well   Post-procedure details: sterile dressing applied and wound care instructions given   Dressing type: bandage and bacitracin    Destruction of lesion Complexity: extensive   Destruction method: electrodesiccation and curettage   Informed consent: discussed and consent obtained   Timeout:  patient name, date of birth, surgical site, and procedure verified Procedure prep:  Patient was prepped and draped in usual sterile fashion Prep type:  Isopropyl alcohol Anesthesia: the lesion was anesthetized in a standard fashion   Anesthetic:  1% lidocaine w/ epinephrine 1-100,000 buffered w/ 8.4% NaHCO3 Curettage performed in three different directions: Yes   Electrodesiccation performed over the curetted area: Yes   Lesion length (cm):  1.1 Lesion width (cm):  1.1 Margin per side (cm):  0.2 Final wound size (cm):  1.5 Hemostasis achieved with:  pressure, aluminum chloride and electrodesiccation Outcome: patient tolerated procedure well with no complications  Post-procedure details: sterile dressing applied and wound care instructions given   Dressing type: bandage and bacitracin    Specimen 1 - Surgical pathology Differential Diagnosis: D48.5 R/O BCC Check Margins: No 1.1cm crusted patch EDC  Xyphoid  Skin /  nail biopsy Type of biopsy: tangential   Informed consent: discussed and consent obtained   Timeout: patient name, date of birth, surgical site, and procedure verified   Procedure prep:  Patient was prepped and draped in usual sterile fashion Prep type:  Isopropyl alcohol Anesthesia: the lesion was anesthetized in a standard fashion   Anesthetic:  1% lidocaine w/ epinephrine 1-100,000 buffered w/ 8.4% NaHCO3 Instrument used: flexible razor blade   Outcome: patient tolerated procedure well   Post-procedure details: sterile dressing applied and wound care instructions given   Dressing type: bandage and bacitracin    Specimen 2 - Surgical pathology Differential Diagnosis: D48.5 R/O BCC Check Margins: No 1.3cm ulcered patch Plan excision if skin cancer  If bx on Xyphoid is skin cancer plan excising  Lentigines - Scattered tan macules - Due to sun exposure - Benign-appearing, observe - Recommend daily broad spectrum sunscreen SPF 30+ to sun-exposed areas, reapply every 2 hours as needed. - Call for any changes   Return in about 6 months (around 11/01/2022) for f/u bx, hx of BCCs, face, sun exposed areas.  I, Stacy Cook, RMA, am acting as scribe for Sarina Ser, MD . Documentation: I have reviewed the above documentation for accuracy and completeness, and I agree with the above.  Sarina Ser, MD

## 2022-05-03 NOTE — Patient Instructions (Addendum)
Wound Care Instructions  Cleanse wound gently with soap and water once a day then pat dry with clean gauze. Apply a thin coat of Petrolatum (petroleum jelly, "Vaseline") over the wound (unless you have an allergy to this). We recommend that you use a new, sterile tube of Vaseline. Do not pick or remove scabs. Do not remove the yellow or white "healing tissue" from the base of the wound.  Cover the wound with fresh, clean, nonstick gauze and secure with paper tape. You may use Band-Aids in place of gauze and tape if the wound is small enough, but would recommend trimming much of the tape off as there is often too much. Sometimes Band-Aids can irritate the skin.  You should call the office for your biopsy report after 1 week if you have not already been contacted.  If you experience any problems, such as abnormal amounts of bleeding, swelling, significant bruising, significant pain, or evidence of infection, please call the office immediately.  FOR ADULT SURGERY PATIENTS: If you need something for pain relief you may take 1 extra strength Tylenol (acetaminophen) AND 2 Ibuprofen (200mg each) together every 4 hours as needed for pain. (do not take these if you are allergic to them or if you have a reason you should not take them.) Typically, you may only need pain medication for 1 to 3 days.     Due to recent changes in healthcare laws, you may see results of your pathology and/or laboratory studies on MyChart before the doctors have had a chance to review them. We understand that in some cases there may be results that are confusing or concerning to you. Please understand that not all results are received at the same time and often the doctors may need to interpret multiple results in order to provide you with the best plan of care or course of treatment. Therefore, we ask that you please give us 2 business days to thoroughly review all your results before contacting the office for clarification. Should  we see a critical lab result, you will be contacted sooner.   If You Need Anything After Your Visit  If you have any questions or concerns for your doctor, please call our main line at [phone] and press option 4 to reach your doctor's medical assistant. If no one answers, please leave a voicemail as directed and we will return your call as soon as possible. Messages left after 4 pm will be answered the following business day.   You may also send us a message via MyChart. We typically respond to MyChart messages within 1-2 business days.  For prescription refills, please ask your pharmacy to contact our office. Our fax number is [phone].  If you have an urgent issue when the clinic is closed that cannot wait until the next business day, you can page your doctor at the number below.    Please note that while we do our best to be available for urgent issues outside of office hours, we are not available 24/7.   If you have an urgent issue and are unable to reach us, you may choose to seek medical care at your doctor's office, retail clinic, urgent care center, or emergency room.  If you have a medical emergency, please immediately call 911 or go to the emergency department.  Pager Numbers  - Dr. Kowalski: [phone]  - Dr. Moye: [phone]  - Dr. Stewart: [phone]  In the event of inclement weather, please call our main line at   [phone] for an update on the status of any delays or closures.  Dermatology Medication Tips: Please keep the boxes that topical medications come in in order to help keep track of the instructions about where and how to use these. Pharmacies typically print the medication instructions only on the boxes and not directly on the medication tubes.   If your medication is too expensive, please contact our office at [phone] option 4 or send us a message through MyChart.   We are unable to tell what your co-pay for medications will be in  advance as this is different depending on your insurance coverage. However, we may be able to find a substitute medication at lower cost or fill out paperwork to get insurance to cover a needed medication.   If a prior authorization is required to get your medication covered by your insurance company, please allow us 1-2 business days to complete this process.  Drug prices often vary depending on where the prescription is filled and some pharmacies may offer cheaper prices.  The website www.goodrx.com contains coupons for medications through different pharmacies. The prices here do not account for what the cost may be with help from insurance (it may be cheaper with your insurance), but the website can give you the price if you did not use any insurance.  - You can print the associated coupon and take it with your prescription to the pharmacy.  - You may also stop by our office during regular business hours and pick up a GoodRx coupon card.  - If you need your prescription sent electronically to a different pharmacy, notify our office through [facility] MyChart or by phone at [phone] option 4.     Si Usted Necesita Algo Despus de Su Visita  Tambin puede enviarnos un mensaje a travs de MyChart. Por lo general respondemos a los mensajes de MyChart en el transcurso de 1 a 2 das hbiles.  Para renovar recetas, por favor pida a su farmacia que se ponga en contacto con nuestra oficina. Nuestro nmero de fax es el [phone].  Si tiene un asunto urgente cuando la clnica est cerrada y que no puede esperar hasta el siguiente da hbil, puede llamar/localizar a su doctor(a) al nmero que aparece a continuacin.   Por favor, tenga en cuenta que aunque hacemos todo lo posible para estar disponibles para asuntos urgentes fuera del horario de oficina, no estamos disponibles las 24 horas del da, los 7 das de la semana.   Si tiene un problema urgente y no puede comunicarse con nosotros, puede  optar por buscar atencin mdica  en el consultorio de su doctor(a), en una clnica privada, en un centro de atencin urgente o en una sala de emergencias.  Si tiene una emergencia mdica, por favor llame inmediatamente al 911 o vaya a la sala de emergencias.  Nmeros de bper  - Dr. Kowalski: [phone]  - Dra. Moye: [phone]  - Dra. Stewart: [phone]  En caso de inclemencias del tiempo, por favor llame a nuestra lnea principal al [phone] para una actualizacin sobre el estado de cualquier retraso o cierre.  Consejos para la medicacin en dermatologa: Por favor, guarde las cajas en las que vienen los medicamentos de uso tpico para ayudarle a seguir las instrucciones sobre dnde y cmo usarlos. Las farmacias generalmente imprimen las instrucciones del medicamento slo en las cajas y no directamente en los tubos del medicamento.   Si su medicamento es muy caro, por favor, pngase en contacto con   nuestra oficina llamando al [phone] y presione la opcin 4 o envenos un mensaje a travs de MyChart.   No podemos decirle cul ser su copago por los medicamentos por adelantado ya que esto es diferente dependiendo de la cobertura de su seguro. Sin embargo, es posible que podamos encontrar un medicamento sustituto a menor costo o llenar un formulario para que el seguro cubra el medicamento que se considera necesario.   Si se requiere una autorizacin previa para que su compaa de seguros cubra su medicamento, por favor permtanos de 1 a 2 das hbiles para completar este proceso.  Los precios de los medicamentos varan con frecuencia dependiendo del lugar de dnde se surte la receta y alguna farmacias pueden ofrecer precios ms baratos.  El sitio web www.goodrx.com tiene cupones para medicamentos de diferentes farmacias. Los precios aqu no tienen en cuenta lo que podra costar con la ayuda del seguro (puede ser ms barato con su seguro), pero el sitio web puede darle el  precio si no utiliz ningn seguro.  - Puede imprimir el cupn correspondiente y llevarlo con su receta a la farmacia.  - Tambin puede pasar por nuestra oficina durante el horario de atencin regular y recoger una tarjeta de cupones de GoodRx.  - Si necesita que su receta se enve electrnicamente a una farmacia diferente, informe a nuestra oficina a travs de MyChart de [facility] o por telfono llamando al [phone] y presione la opcin 4.  

## 2022-05-04 ENCOUNTER — Telehealth: Payer: Self-pay | Admitting: *Deleted

## 2022-05-04 ENCOUNTER — Ambulatory Visit
Admission: RE | Admit: 2022-05-04 | Discharge: 2022-05-04 | Disposition: A | Discharge status: Home / Self Care | Payer: Medicare PPO | Source: Ambulatory Visit | Attending: Internal Medicine | Admitting: Internal Medicine

## 2022-05-04 DIAGNOSIS — C3431 Malignant neoplasm of lower lobe, right bronchus or lung: Secondary | ICD-10-CM | POA: Diagnosis present

## 2022-05-04 NOTE — Telephone Encounter (Signed)
Her recent scan may show some infection. Called pt to see if she is having any URI symptoms. She denies dyspnea. Has had a cough for the last 3-4 weeks, clear mucus. No fever. Pt has appt here on 05/14/22. I told her to call us if symptoms occur or cough worsens. She agreed to do that.

## 2022-05-04 NOTE — Telephone Encounter (Signed)
Called report  IMPRESSION: 1. Slight interval decrease in size of large chronic appearing and largely sub pulmonic pleural effusion in the RIGHT chest. Area now contains some gas. This is very likely related to interval sampling based on the combination of these factors. Would also correlate with any signs of infection particularly if there is no recent history of thoracentesis. 2. Otherwise stable appearance of the RIGHT chest. 3. Small pulmonary nodule in the LEFT lower lobe is indistinct at 3-4 mm and is stable compared to previous imaging. 4. Reportedly the patient has a recent history of some shortness of breath. Patchy areas of ground-glass the LEFT upper lobe in particular but also seen to a lesser extent the LEFT lower lobe may correlate with ongoing infection. Findings could also be seen in the setting of respiratory bronchiolitis. 5. Post trans arterial aortic valve replacement. 6. Calcified coronary artery disease. 7. Small hiatal hernia. 8. Emphysema and aortic atherosclerosis.   Aortic Atherosclerosis (ICD10-I70.0) and Emphysema (ICD10-J43.9).   These results will be called to the ordering clinician or representative by the Radiologist Assistant, and communication documented in the PACS or [employer].     Electronically Signed   By: Zetta Bills M.D.   On: 05/04/2022 15:36

## 2022-05-07 ENCOUNTER — Other Ambulatory Visit: Payer: Medicare PPO

## 2022-05-07 ENCOUNTER — Ambulatory Visit: Payer: Medicare PPO | Admitting: Internal Medicine

## 2022-05-09 ENCOUNTER — Telehealth: Payer: Self-pay

## 2022-05-09 NOTE — Telephone Encounter (Signed)
Advised pt of bx results and scheduled pt for surgery.Stacy Cook

## 2022-05-09 NOTE — Telephone Encounter (Signed)
-----  Message from Ralene Bathe, MD sent at 05/08/2022  6:48 PM EST ----- Diagnosis 1. Skin , right ant deltoid BASAL CELL CARCINOMA, NODULAR PATTERN 2. Skin , xyphoid BASAL CELL CARCINOMA, NODULAR AND INFILTRATIVE PATTERNS  1- Cancer - Bcc Already treated Recheck next visit 2- Cancer - BCC Schedule for surgery

## 2022-05-11 ENCOUNTER — Encounter: Payer: Self-pay | Admitting: Dermatology

## 2022-05-14 ENCOUNTER — Inpatient Hospital Stay: Payer: Medicare PPO | Attending: Internal Medicine

## 2022-05-14 ENCOUNTER — Encounter: Payer: Self-pay | Admitting: Internal Medicine

## 2022-05-14 ENCOUNTER — Inpatient Hospital Stay (HOSPITAL_BASED_OUTPATIENT_CLINIC_OR_DEPARTMENT_OTHER): Payer: Medicare PPO | Admitting: Internal Medicine

## 2022-05-14 VITALS — BP 148/57 | HR 96 | Temp 97.1°F | Resp 18 | Wt 107.2 lb

## 2022-05-14 DIAGNOSIS — D693 Immune thrombocytopenic purpura: Secondary | ICD-10-CM | POA: Diagnosis not present

## 2022-05-14 DIAGNOSIS — Z85038 Personal history of other malignant neoplasm of large intestine: Secondary | ICD-10-CM | POA: Diagnosis not present

## 2022-05-14 DIAGNOSIS — M810 Age-related osteoporosis without current pathological fracture: Secondary | ICD-10-CM | POA: Insufficient documentation

## 2022-05-14 DIAGNOSIS — I2510 Atherosclerotic heart disease of native coronary artery without angina pectoris: Secondary | ICD-10-CM | POA: Insufficient documentation

## 2022-05-14 DIAGNOSIS — E538 Deficiency of other specified B group vitamins: Secondary | ICD-10-CM | POA: Diagnosis not present

## 2022-05-14 DIAGNOSIS — J45909 Unspecified asthma, uncomplicated: Secondary | ICD-10-CM | POA: Diagnosis not present

## 2022-05-14 DIAGNOSIS — Z8673 Personal history of transient ischemic attack (TIA), and cerebral infarction without residual deficits: Secondary | ICD-10-CM | POA: Insufficient documentation

## 2022-05-14 DIAGNOSIS — Z85828 Personal history of other malignant neoplasm of skin: Secondary | ICD-10-CM | POA: Diagnosis not present

## 2022-05-14 DIAGNOSIS — Z79899 Other long term (current) drug therapy: Secondary | ICD-10-CM | POA: Insufficient documentation

## 2022-05-14 DIAGNOSIS — Z86010 Personal history of colonic polyps: Secondary | ICD-10-CM | POA: Diagnosis not present

## 2022-05-14 DIAGNOSIS — E785 Hyperlipidemia, unspecified: Secondary | ICD-10-CM | POA: Diagnosis not present

## 2022-05-14 DIAGNOSIS — I129 Hypertensive chronic kidney disease with stage 1 through stage 4 chronic kidney disease, or unspecified chronic kidney disease: Secondary | ICD-10-CM | POA: Insufficient documentation

## 2022-05-14 DIAGNOSIS — D649 Anemia, unspecified: Secondary | ICD-10-CM | POA: Insufficient documentation

## 2022-05-14 DIAGNOSIS — N184 Chronic kidney disease, stage 4 (severe): Secondary | ICD-10-CM | POA: Insufficient documentation

## 2022-05-14 DIAGNOSIS — E119 Type 2 diabetes mellitus without complications: Secondary | ICD-10-CM | POA: Diagnosis not present

## 2022-05-14 DIAGNOSIS — Z7982 Long term (current) use of aspirin: Secondary | ICD-10-CM | POA: Insufficient documentation

## 2022-05-14 DIAGNOSIS — I10 Essential (primary) hypertension: Secondary | ICD-10-CM | POA: Diagnosis not present

## 2022-05-14 DIAGNOSIS — Z87442 Personal history of urinary calculi: Secondary | ICD-10-CM | POA: Diagnosis not present

## 2022-05-14 DIAGNOSIS — K219 Gastro-esophageal reflux disease without esophagitis: Secondary | ICD-10-CM | POA: Diagnosis not present

## 2022-05-14 DIAGNOSIS — C3431 Malignant neoplasm of lower lobe, right bronchus or lung: Secondary | ICD-10-CM | POA: Insufficient documentation

## 2022-05-14 LAB — CBC WITH DIFFERENTIAL/PLATELET
Abs Immature Granulocytes: 0.01 10*3/uL (ref 0.00–0.07)
Basophils Absolute: 0 10*3/uL (ref 0.0–0.1)
Basophils Relative: 1 %
Eosinophils Absolute: 0.1 10*3/uL (ref 0.0–0.5)
Eosinophils Relative: 2 %
HCT: 30.3 % — ABNORMAL LOW (ref 36.0–46.0)
Hemoglobin: 9.7 g/dL — ABNORMAL LOW (ref 12.0–15.0)
Immature Granulocytes: 0 %
Lymphocytes Relative: 16 %
Lymphs Abs: 0.7 10*3/uL (ref 0.7–4.0)
MCH: 29.5 pg (ref 26.0–34.0)
MCHC: 32 g/dL (ref 30.0–36.0)
MCV: 92.1 fL (ref 80.0–100.0)
Monocytes Absolute: 0.3 10*3/uL (ref 0.1–1.0)
Monocytes Relative: 7 %
Neutro Abs: 3.1 10*3/uL (ref 1.7–7.7)
Neutrophils Relative %: 74 %
Platelets: 153 10*3/uL (ref 150–400)
RBC: 3.29 MIL/uL — ABNORMAL LOW (ref 3.87–5.11)
RDW: 13.3 % (ref 11.5–15.5)
WBC: 4.2 10*3/uL (ref 4.0–10.5)
nRBC: 0 % (ref 0.0–0.2)

## 2022-05-14 LAB — COMPREHENSIVE METABOLIC PANEL
ALT: 10 U/L (ref 0–44)
AST: 19 U/L (ref 15–41)
Albumin: 3.7 g/dL (ref 3.5–5.0)
Alkaline Phosphatase: 103 U/L (ref 38–126)
Anion gap: 8 (ref 5–15)
BUN: 27 mg/dL — ABNORMAL HIGH (ref 8–23)
CO2: 24 mmol/L (ref 22–32)
Calcium: 8.6 mg/dL — ABNORMAL LOW (ref 8.9–10.3)
Chloride: 101 mmol/L (ref 98–111)
Creatinine, Ser: 1.25 mg/dL — ABNORMAL HIGH (ref 0.44–1.00)
GFR, Estimated: 43 mL/min — ABNORMAL LOW (ref >60)
Glucose, Bld: 104 mg/dL — ABNORMAL HIGH (ref 70–99)
Potassium: 3.6 mmol/L (ref 3.5–5.1)
Sodium: 133 mmol/L — ABNORMAL LOW (ref 135–145)
Total Bilirubin: 0.4 mg/dL (ref 0.3–1.2)
Total Protein: 6.8 g/dL (ref 6.5–8.1)

## 2022-05-14 LAB — IRON AND TIBC
Iron: 54 ug/dL (ref 28–170)
Saturation Ratios: 20 % (ref 10.4–31.8)
TIBC: 277 ug/dL (ref 250–450)
UIBC: 223 ug/dL

## 2022-05-14 LAB — LACTATE DEHYDROGENASE: LDH: 150 U/L (ref 98–192)

## 2022-05-14 LAB — FERRITIN: Ferritin: 122 ng/mL (ref 11–307)

## 2022-05-14 MED ORDER — POTASSIUM CITRATE ER 10 MEQ (1080 MG) PO TBCR: 10.0000 meq | EXTENDED_RELEASE_TABLET | Freq: Every day | ORAL | 0 refills | Status: DC

## 2022-05-14 NOTE — Progress Notes (Signed)
[name] OFFICE PROGRESS NOTE  Patient Care Team: Adin Hector, MD as PCP - General (Internal Medicine) [name], MD (Inactive) as PCP - Electrophysiology (Cardiology) Cammie Sickle, MD as Consulting Physician (Internal Medicine) Efrain Sella, MD as Consulting Physician (Gastroenterology)   Cancer Staging  Primary malignant neoplasm of right lower lobe of lung [facility]) Staging form: Lung, AJCC 7th Edition - Clinical: No stage assigned - Unsigned    Oncology History Overview Note  # DEC 2017- Adeno ca [GATA; her 2 Neu-NEG]; signet ring [1.5 x3 mm gastric incisura; Dr.Skulskie]; EUS [Dr.Burnbridge]; no significant abnormality noted;  Reviewed at [facility] also. JAN 2018- PET NED. April 2018- S/p partial gastrectomy [Dr.Sankar]- STAGE I ADENO CA; NO adjuvant therapy.  # STAGE I CARCINOID s/p partial gastrectomy   # May 2018- Chronic Atrophic gastritis- Prevpac   # FEB 2017- ADENOCARCINOMA with Lepidic 80%-20% acinar pattern; pT2a [Stage IB;T-2.3cm; visceral pleural invasion present; pN=0 ]; AUG 2017- CT NED;   # DEC 2019- RECURRENT/STAGE IV ADENO LUNG CA- EGFR MUTATED;# Jan 6th 2020-; START Osidemrtinib;  # AUG 2020- SEVERE AS [s/p TAVR; [place]]-complicated by R sided stroke/ seizures/acute renal failure.   # DEC 2nd 2020- START PROMACTA 25 mg/day [ITP]  # Molecular testing: EFGR mutated [id number] [omniseq]  # Palliative: O-1/20   DIAGNOSIS:  #ADENO CA LUNG-STAGE IV #Stomach adeno ca [stage I; dec 2017]  GOALS: pallaitive  CURRENT/MOST RECENT THERAPY - OSIMERTINIB [Jan 6th 2020]      Primary malignant neoplasm of right lower lobe of lung ([place])  12/02/2015 Initial Diagnosis   Primary malignant neoplasm of right lower lobe of lung (HCC)   Adenocarcinoma of gastric cardia (HCC)   INTERVAL HISTORY: Alone.  Ambulating independantly.   Stacy Cook 87 y.o.  female pleasant patient above history of recurrent/stage IV adenocarcinoma lung/EGFR  mutated currently on osimertinib is here for follow-up/ review the results of the CT scan.   Pt has chronic fatigue energy. No chest discomfort. Dyspnea. Has a cough with clear sputum. Small appetite. Early satiation after stomach cancer surgery. Has arthritis in her hips. Ambulatory. Pt said her right side still feels weak from her stroke.   No hemoptysis.  Denies any worsening swelling of the legs. No history of ongoing intermittent abdominal pain right lower quadrant.  Currently pain resolved.   Review of Systems  Constitutional:  Positive for malaise/fatigue. Negative for chills, diaphoresis and fever.  HENT:  Negative for nosebleeds and sore throat.   Eyes:  Negative for double vision.  Respiratory:  Negative for hemoptysis, sputum production and wheezing.   Cardiovascular:  Negative for chest pain, palpitations, orthopnea and leg swelling.  Gastrointestinal:  Negative for blood in stool, constipation, melena, nausea and vomiting.  Genitourinary:  Negative for dysuria, frequency and urgency.  Musculoskeletal:  Positive for back pain and joint pain.  Neurological:  Positive for focal weakness. Negative for dizziness, tingling, weakness and headaches.  Endo/Heme/Allergies:  Bruises/bleeds easily.  Psychiatric/Behavioral:  Negative for depression. The patient is not nervous/anxious and does not have insomnia.       PAST MEDICAL HISTORY :  Past Medical History:  Diagnosis Date   Abdominal pain    Acid reflux 03/24/2015   Adenocarcinoma of gastric cardia ([place]) 04/10/2016   Allergic genetic state    Anemia    Arteriosclerosis of coronary artery 03/24/2015   Arthritis    "back, hands" (10/15/2017)   Asthma    B12 deficiency anemia    Basal cell  carcinoma 01/01/2022   glabella, [facility]   Basal cell carcinoma 05/03/2022   R ant deltoid, [facility]   Basal cell carcinoma 05/03/2022   Xyphoid, needs excision   Bile reflux gastritis    CAD (coronary artery disease)    Cancer of right lung ([place])  05/09/2015   Dr. Genevive Bi performed Right lower lobe lobectomy.    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 08/03/2016   Carotid stenosis 02/07/2016   Cataract cortical, senile    Chest pain    Colon polyp    Degeneration of intervertebral disc of lumbar region 03/11/2014   Diabetes ([place])    Gastric adenocarcinoma (HCC)    GERD (gastroesophageal reflux disease)    also, history of ulcers   History of kidney stones    History of stroke    HLD (hyperlipidemia) 08/24/2013   Hypertension    Malignant tumor of stomach ([place]) 07/2016   Adenocarcinoma, diffuse, poorly differentiated, signet ring, stage I   Neuritis or radiculitis due to rupture of lumbar intervertebral disc 09/10/2014   Neuroendocrine tumor 03/24/2015   Neuroma    Osteoporosis    Peripheral vascular disease (HCC)    PONV (postoperative nausea and vomiting)    Presence of permanent cardiac pacemaker 12/09/2018   Primary malignant neoplasm of right lower lobe of lung ([place]) 12/02/2015   Renal insufficiency    Renal stone    S/P TAVR (transcatheter aortic valve replacement)    Sciatica    Severe aortic stenosis    Skin cancer    "cut/burned LUE; cut off right eye/nose & cut off chest" (10/15/2017)   Stomach ulcer    Thrombocytopenia ([place])    Type 2 diabetes, diet controlled ([place])    "no RX since stomach OR 07/2016" (10/15/2017)    PAST SURGICAL HISTORY :   Past Surgical History:  Procedure Laterality Date   APPENDECTOMY     CARDIAC CATHETERIZATION  X2 before 10/15/2017   CARDIAC VALVE REPLACEMENT     CATARACT EXTRACTION W/PHACO Left 04/04/2017   Procedure: CATARACT EXTRACTION PHACO AND INTRAOCULAR LENS PLACEMENT ([place]);  Surgeon: Leandrew Koyanagi, MD;  Location: [facility] ORS;  Service: Ophthalmology;  Laterality: Left;  Lot # [id number] H [place] 1:00 Ap 25% CDE 8.54   CATARACT EXTRACTION W/PHACO Right 05/15/2017   Procedure: CATARACT EXTRACTION PHACO AND INTRAOCULAR LENS PLACEMENT ([facility]);  Surgeon: Leandrew Koyanagi, MD;  Location:  [facility] ORS;  Service: Ophthalmology;  Laterality: Right;  [place] 01:10 AP% 18.3 CDE 12.91 Fluid pack lot # [id number]   COLONOSCOPY     CORONARY ATHERECTOMY N/A 10/15/2017   Procedure: CORONARY ATHERECTOMY;  Surgeon: Burnell Blanks, MD;  Location: [place] CV LAB;  Service: Cardiovascular;  Laterality: N/A;   CORONARY STENT INTERVENTION N/A 10/15/2017   Procedure: CORONARY STENT INTERVENTION;  Surgeon: Burnell Blanks, MD;  Location: [place] CV LAB;  Service: Cardiovascular;  Laterality: N/A;   CYSTOSCOPY/URETEROSCOPY/HOLMIUM LASER/STENT PLACEMENT Left 08/03/2019   Procedure: CYSTOSCOPY/URETEROSCOPY/HOLMIUM LASER/STENT PLACEMENT;  Surgeon: Hollice Espy, MD;  Location: [facility] ORS;  Service: Urology;  Laterality: Left;   ESOPHAGOGASTRODUODENOSCOPY     ESOPHAGOGASTRODUODENOSCOPY (EGD) WITH PROPOFOL N/A 03/27/2016   Procedure: ESOPHAGOGASTRODUODENOSCOPY (EGD) WITH PROPOFOL;  Surgeon: Lollie Sails, MD;  Location: [facility] ENDOSCOPY;  Service: Endoscopy;  Laterality: N/A;   ESOPHAGOGASTRODUODENOSCOPY (EGD) WITH PROPOFOL N/A 05/28/2016   Procedure: ESOPHAGOGASTRODUODENOSCOPY (EGD) WITH PROPOFOL;  Surgeon: Lollie Sails, MD;  Location: [facility] ENDOSCOPY;  Service: Endoscopy;  Laterality: N/A;   ESOPHAGOGASTRODUODENOSCOPY (EGD) WITH PROPOFOL N/A 09/25/2016   Procedure: ESOPHAGOGASTRODUODENOSCOPY (  EGD) WITH PROPOFOL;  Surgeon: Christene Lye, MD;  Location: [facility] ENDOSCOPY;  Service: Endoscopy;  Laterality: N/A;   ESOPHAGOGASTRODUODENOSCOPY (EGD) WITH PROPOFOL N/A 12/18/2016   Procedure: ESOPHAGOGASTRODUODENOSCOPY (EGD) WITH PROPOFOL;  Surgeon: Christene Lye, MD;  Location: [facility] ENDOSCOPY;  Service: Endoscopy;  Laterality: N/A;   ESOPHAGOGASTRODUODENOSCOPY (EGD) WITH PROPOFOL N/A 01/23/2017   Procedure: ESOPHAGOGASTRODUODENOSCOPY (EGD) WITH PROPOFOL;  Surgeon: Christene Lye, MD;  Location: [facility] ENDOSCOPY;  Service: Endoscopy;  Laterality: N/A;    ESOPHAGOGASTRODUODENOSCOPY (EGD) WITH PROPOFOL N/A 03/20/2017   Procedure: ESOPHAGOGASTRODUODENOSCOPY (EGD) WITH PROPOFOL;  Surgeon: Christene Lye, MD;  Location: [facility] ENDOSCOPY;  Service: Endoscopy;  Laterality: N/A;   ESOPHAGOGASTRODUODENOSCOPY (EGD) WITH PROPOFOL N/A 09/23/2020   Procedure: ESOPHAGOGASTRODUODENOSCOPY (EGD) WITH PROPOFOL;  Surgeon: Lesly Rubenstein, MD;  Location: [facility] ENDOSCOPY;  Service: Endoscopy;  Laterality: N/A;  [place] SAYS "AMPICILLIN IS NEEDED"   FRACTURE SURGERY     INSERT / REPLACE / REMOVE PACEMAKER  12/09/2018   PACEMAKER LEADLESS INSERTION N/A 12/09/2018   Procedure: PACEMAKER LEADLESS INSERTION;  Surgeon: [name], MD;  Location: [place] CV LAB;  Service: Cardiovascular;  Laterality: N/A;   PARTIAL GASTRECTOMY N/A 08/03/2016   Hemigastrectomy, [place] I reconstruction Surgeon: Christene Lye, MD;  Location: [facility] ORS;  Service: General;  Laterality: N/A;   RIGHT/LEFT HEART CATH AND CORONARY ANGIOGRAPHY Bilateral 09/19/2017   Procedure: RIGHT/LEFT HEART CATH AND CORONARY ANGIOGRAPHY;  Surgeon: Yolonda Kida, MD;  Location: [place] CV LAB;  Service: Cardiovascular;  Laterality: Bilateral;   SHOULDER ARTHROSCOPY W/ CAPSULAR REPAIR Right    SKIN CANCER EXCISION     "cut/burned LUE; cut off right eye/nose & cut off chest" (10/15/2017)   TEE WITHOUT CARDIOVERSION N/A 12/02/2018   Procedure: TRANSESOPHAGEAL ECHOCARDIOGRAM (TEE);  Surgeon: Burnell Blanks, MD;  Location: [place] CV LAB;  Service: Open Heart Surgery;  Laterality: N/A;   THORACOTOMY Right 05/09/2015   Procedure: THORACOTOMY, RIGHT LOWER LOBECTOMY, BRONCHOSCOPY;  Surgeon: Nestor Lewandowsky, MD;  Location: [facility] ORS;  Service: Thoracic;  Laterality: Right;   TONSILLECTOMY  1944   TRANSCATHETER AORTIC VALVE REPLACEMENT, TRANSFEMORAL N/A 12/02/2018   Procedure: TRANSCATHETER AORTIC VALVE REPLACEMENT, TRANSFEMORAL;  Surgeon: Burnell Blanks, MD;  Location: [place] CV LAB;  Service: Open Heart Surgery;  Laterality: N/A;   TUBAL LIGATION     UPPER GI ENDOSCOPY N/A 08/03/2016   Procedure: UPPER  ENDOSCOPY;  Surgeon: Christene Lye, MD;  Location: [facility] ORS;  Service: General;  Laterality: N/A;   VAGINAL HYSTERECTOMY     WRIST FRACTURE SURGERY Right     FAMILY HISTORY :   Family History  Problem Relation Age of Onset   Aortic aneurysm Mother    Kidney Stones Mother    Hypertension Mother    Hypertension Father    Diabetes Other    Breast cancer Neg Hx    Prostate cancer Neg Hx    Bladder Cancer Neg Hx    Kidney cancer Neg Hx     SOCIAL HISTORY:   Social History   Tobacco Use   Smoking status: Never   Smokeless tobacco: Never  Vaping Use   Vaping Use: Never used  Substance Use Topics   Alcohol use: Not Currently   Drug use: Never    ALLERGIES:  is allergic to mirtazapine, pneumococcal vaccine, statins, and sulfa antibiotics.  MEDICATIONS:  Current Outpatient Medications  Medication Sig Dispense Refill   aspirin EC 81 MG tablet Take 81 mg by mouth daily.  cholecalciferol (VITAMIN D) 1000 UNITS tablet Take 1,000 Units by mouth daily.     cyanocobalamin 1000 MCG tablet Take 1,000 mcg by mouth daily.      eltrombopag (PROMACTA) 25 MG tablet TAKE 1 TABLET BY MOUTH DAILY. TAKE ON AN EMPTY STOMACH, 1 HOUR BEFORE A MEAL OR 2 HOURS AFTER. 30 tablet 6   ezetimibe (ZETIA) 10 MG tablet Take 10 mg by mouth daily.     furosemide (LASIX) 20 MG tablet Take 1 tablet (20 mg total) by mouth daily. Will start taking this on 01/30/22 for lasix is being held x 3 days. 90 tablet 1   isosorbide mononitrate (IMDUR) 30 MG 24 hr tablet Take 1 tablet by mouth once daily 90 tablet 2   Magnesium 100 MG TABS Take by mouth.     metoprolol succinate (TOPROL-XL) 50 MG 24 hr tablet Take 1 tablet (50 mg total) by mouth every evening. Take with or immediately following a meal. 30 tablet 0   osimertinib mesylate (TAGRISSO) 80 MG tablet Take 1 tablet  (80 mg total) by mouth daily. 30 tablet 5   potassium citrate (UROCIT-K) 10 MEQ (1080 MG) SR tablet Take 1 tablet (10 mEq total) by mouth daily. 90 tablet 0   No current facility-administered medications for this visit.    PHYSICAL EXAMINATION: ECOG PERFORMANCE STATUS: 0 - Asymptomatic  BP (!) 148/57 (BP Location: Left Arm, Patient Position: Sitting)   Pulse 96   Temp (!) 97.1 F (36.2 C) (Tympanic)   Resp 18   Wt 107 lb 3.2 oz (48.6 kg)   SpO2 100%   BMI 20.26 kg/m   Filed Weights   05/14/22 1518  Weight: 107 lb 3.2 oz (48.6 kg)   Decreased breath sounds on the right side compared to left.  Physical Exam Constitutional:      Comments: Patient walking by herself; no assistive devices.  Alone.  HENT:     Head: Normocephalic and atraumatic.     Mouth/Throat:     Pharynx: No oropharyngeal exudate.  Eyes:     Pupils: Pupils are equal, round, and reactive to light.  Cardiovascular:     Rate and Rhythm: Normal rate and regular rhythm.     Heart sounds: Murmur heard.  Pulmonary:     Effort: No respiratory distress.     Breath sounds: No wheezing.  Abdominal:     General: Bowel sounds are normal. There is no distension.     Palpations: Abdomen is soft. There is no mass.     Tenderness: There is no abdominal tenderness. There is no guarding or rebound.  Musculoskeletal:        General: No tenderness. Normal range of motion.     Cervical back: Normal range of motion and neck supple.  Skin:    General: Skin is warm.  Neurological:     Mental Status: She is alert and oriented to person, place, and time.     Comments: Weakness of the right upper extremity more than lower extremity.  Psychiatric:        Mood and Affect: Affect normal.    LABORATORY DATA:  I have reviewed the data as listed    Component Value Date/Time   NA 133 (L) 05/14/2022 1447   NA 141 02/05/2022 1405   NA 141 07/21/2012 1529   K 3.6 05/14/2022 1447   K 3.9 07/21/2012 1529   CL 101 05/14/2022  1447   CL 109 (H) 07/21/2012 1529   CO2 24 05/14/2022  1447   CO2 27 07/21/2012 1529   GLUCOSE 104 (H) 05/14/2022 1447   GLUCOSE 104 (H) 07/21/2012 1529   BUN 27 (H) 05/14/2022 1447   BUN 34 (H) 02/05/2022 1405   BUN 16 07/21/2012 1529   CREATININE 1.25 (H) 05/14/2022 1447   CREATININE 1.11 09/03/2012 1535   CALCIUM 8.6 (L) 05/14/2022 1447   CALCIUM 8.9 07/21/2012 1529   PROT 6.8 05/14/2022 1447   PROT 7.1 07/21/2012 1529   ALBUMIN 3.7 05/14/2022 1447   ALBUMIN 3.9 07/21/2012 1529   AST 19 05/14/2022 1447   AST 26 07/21/2012 1529   ALT 10 05/14/2022 1447   ALT 22 07/21/2012 1529   ALKPHOS 103 05/14/2022 1447   ALKPHOS 76 07/21/2012 1529   BILITOT 0.4 05/14/2022 1447   BILITOT 0.3 07/21/2012 1529   GFRNONAA 43 (L) 05/14/2022 1447   GFRNONAA 49 (L) 09/03/2012 1535   GFRAA 29 (L) 01/05/2020 1440   GFRAA 57 (L) 09/03/2012 1535    No results found for: "SPEP", "UPEP"  Lab Results  Component Value Date   WBC 4.2 05/14/2022   NEUTROABS 3.1 05/14/2022   HGB 9.7 (L) 05/14/2022   HCT 30.3 (L) 05/14/2022   MCV 92.1 05/14/2022   PLT 153 05/14/2022      Chemistry      Component Value Date/Time   NA 133 (L) 05/14/2022 1447   NA 141 02/05/2022 1405   NA 141 07/21/2012 1529   K 3.6 05/14/2022 1447   K 3.9 07/21/2012 1529   CL 101 05/14/2022 1447   CL 109 (H) 07/21/2012 1529   CO2 24 05/14/2022 1447   CO2 27 07/21/2012 1529   BUN 27 (H) 05/14/2022 1447   BUN 34 (H) 02/05/2022 1405   BUN 16 07/21/2012 1529   CREATININE 1.25 (H) 05/14/2022 1447   CREATININE 1.11 09/03/2012 1535      Component Value Date/Time   CALCIUM 8.6 (L) 05/14/2022 1447   CALCIUM 8.9 07/21/2012 1529   ALKPHOS 103 05/14/2022 1447   ALKPHOS 76 07/21/2012 1529   AST 19 05/14/2022 1447   AST 26 07/21/2012 1529   ALT 10 05/14/2022 1447   ALT 22 07/21/2012 1529   BILITOT 0.4 05/14/2022 1447   BILITOT 0.3 07/21/2012 1529       RADIOGRAPHIC STUDIES: I have personally reviewed the radiological  images as listed and agreed with the findings in the report. No results found.   ASSESSMENT & PLAN:  Primary malignant neoplasm of right lower lobe of lung ([place]) # Stage IV recurrent adenocarcinoma the lung; EGFR mutated. On osimertinib 80 mg [Feb 6th 2020]. JAN 31st, 2024- CT CAP- non-contrast- Right lower lobectomy. No definitive evidence of disease;  Slight interval decrease in size of large chronic appearing and largely sub pulmonic pleural effusion in the RIGHT chest. Small pulmonary nodule in the LEFT lower lobe is indistinct at 3-4 mm and is stable compared to previous imaging.   # currently on Osimertinib 80 mg/day; tolerating well. See above/ Continue current therapy-no obvious evidence of recurrent malignancy at this time. Thoracentesis negative any malignant cells.   #Right pleural effusion-status postthoracentesis-pleural fluid: Transudative; cytology negative for malignancy.  Question CHF/fluid overload.  S/p Lasix 20 mg a day- improved/stable. Currently not on any lasix [ARF].  Improved.  Stable.  # Worsening anemia- Hb 9.3; sep 2023- Iron sat- 17%; ferritin- 44- recommend Iron pills once day.   # ITP- chronic- platelets> 100 on promacta 25 mg/day-  stable.   # Stomach cancer  stage I status post gastrectomy; June 2022- S/p EGD- [facility].- stable.   # right sided stroke/ seizures-on asprin; off plavix;; f/u neurology-[place]-  stable.   # CKD- stage IV; Left Hydronephrosis s/p ureteral stent placement/nephrolithiasis-~ GFR 25 today; Hypokalemia-4.3 continue K-citrate q day.   stable.   # IV ACCESS: PIV  # DISPOSITION: # follow up in 2 months- MD: labs- cbc/cmp;LDH; possible IV venofer-  Dr.B  # I reviewed the blood work- with the patient in detail; also reviewed the imaging independently [as summarized above]; and with the patient in detail.          Orders Placed This Encounter  Procedures   Lactate dehydrogenase    Standing Status:   Future    Standing Expiration Date:    05/15/2023   CBC with Differential/Platelet    Standing Status:   Future    Standing Expiration Date:   05/15/2023   Comprehensive metabolic panel    Standing Status:   Future    Standing Expiration Date:   05/15/2023   All questions were answered. The patient knows to call the clinic with any problems, questions or concerns.      Cammie Sickle, MD 05/14/2022 9:40 PM

## 2022-05-14 NOTE — Progress Notes (Signed)
Pt here for results of her CT scan. No energy. No chest discomfort. Dyspnea. Has a cough with clear sputum. Small appetite. Early satiation after stomach cancer surgery. Has arthritis in her hips. Ambulatory. Pt said her right side still feels weak from her stroke. Otherwise she is "holding on. "

## 2022-05-14 NOTE — Assessment & Plan Note (Addendum)
#   Stage IV recurrent adenocarcinoma the lung; EGFR mutated. On osimertinib 80 mg [Feb 6th 2020]. JAN 31st, 2024- CT CAP- non-contrast- Right lower lobectomy. No definitive evidence of disease;  Slight interval decrease in size of large chronic appearing and largely sub pulmonic pleural effusion in the RIGHT chest. Small pulmonary nodule in the LEFT lower lobe is indistinct at 3-4 mm and is stable compared to previous imaging.   # currently on Osimertinib 80 mg/day; tolerating well. See above/ Continue current therapy-no obvious evidence of recurrent malignancy at this time. Thoracentesis negative any malignant cells.   #Right pleural effusion-status postthoracentesis-pleural fluid: Transudative; cytology negative for malignancy.  Question CHF/fluid overload.  S/p Lasix 20 mg a day- improved/stable. Currently not on any lasix [ARF].  Improved.  Stable.  # Worsening anemia- Hb 9.3; sep 2023- Iron sat- 17%; ferritin- 44- recommend Iron pills once day.   # ITP- chronic- platelets> 100 on promacta 25 mg/day-  stable.   # Stomach cancer stage I status post gastrectomy; June 2022- S/p EGD- [facility].- stable.   # right sided stroke/ seizures-on asprin; off plavix;; f/u neurology-[place]-  stable.   # CKD- stage IV; Left Hydronephrosis s/p ureteral stent placement/nephrolithiasis-~ GFR 25 today; Hypokalemia-4.3 continue K-citrate q day.   stable.   # IV ACCESS: PIV  # DISPOSITION: # follow up in 2 months- MD: labs- cbc/cmp;LDH; possible IV venofer-  Dr.B  # I reviewed the blood work- with the patient in detail; also reviewed the imaging independently [as summarized above]; and with the patient in detail.

## 2022-05-17 ENCOUNTER — Other Ambulatory Visit: Payer: Self-pay | Admitting: Internal Medicine

## 2022-05-17 ENCOUNTER — Ambulatory Visit
Admission: RE | Admit: 2022-05-17 | Discharge: 2022-05-17 | Disposition: A | Discharge status: Home / Self Care | Payer: Medicare PPO | Source: Ambulatory Visit | Attending: Internal Medicine | Admitting: Internal Medicine

## 2022-05-17 DIAGNOSIS — C3431 Malignant neoplasm of lower lobe, right bronchus or lung: Secondary | ICD-10-CM | POA: Insufficient documentation

## 2022-05-17 DIAGNOSIS — R091 Pleurisy: Secondary | ICD-10-CM

## 2022-05-17 DIAGNOSIS — R0602 Shortness of breath: Secondary | ICD-10-CM | POA: Diagnosis present

## 2022-05-17 MED ORDER — IOHEXOL 350 MG/ML SOLN
60.0000 mL | Freq: Once | INTRAVENOUS | Status: AC | PRN
  Administered 2022-05-17: 60 mL via INTRAVENOUS
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
## 2022-05-21 ENCOUNTER — Ambulatory Visit: Payer: Medicare PPO | Attending: Nurse Practitioner | Admitting: Nurse Practitioner

## 2022-05-21 ENCOUNTER — Encounter: Payer: Self-pay | Admitting: Nurse Practitioner

## 2022-05-21 VITALS — BP 132/60 | HR 101 | Ht 61.0 in | Wt 106.2 lb

## 2022-05-21 DIAGNOSIS — I4891 Unspecified atrial fibrillation: Secondary | ICD-10-CM | POA: Diagnosis not present

## 2022-05-21 DIAGNOSIS — I459 Conduction disorder, unspecified: Secondary | ICD-10-CM | POA: Diagnosis not present

## 2022-05-21 DIAGNOSIS — I5032 Chronic diastolic (congestive) heart failure: Secondary | ICD-10-CM

## 2022-05-21 DIAGNOSIS — Z950 Presence of cardiac pacemaker: Secondary | ICD-10-CM | POA: Diagnosis not present

## 2022-05-21 DIAGNOSIS — Z952 Presence of prosthetic heart valve: Secondary | ICD-10-CM

## 2022-05-21 DIAGNOSIS — I350 Nonrheumatic aortic (valve) stenosis: Secondary | ICD-10-CM

## 2022-05-21 DIAGNOSIS — I2510 Atherosclerotic heart disease of native coronary artery without angina pectoris: Secondary | ICD-10-CM

## 2022-05-21 MED ORDER — APIXABAN 2.5 MG PO TABS: 2.5000 mg | ORAL_TABLET | Freq: Two times a day (BID) | ORAL | 2 refills | Status: DC

## 2022-05-21 NOTE — Patient Instructions (Addendum)
Medication Instructions:  START Eliquis 2.5mg  Take 1 tablet twice a day  STOP Aspirin  *If you need a refill on your cardiac medications before your next appointment, please call your pharmacy*   Lab Work: None ordered   Testing/Procedures: None ordered   Follow-Up: At [facility], you and your health needs are our priority.  As part of our continuing mission to provide you with exceptional heart care, we have created designated Provider Care Teams.  These Care Teams include your primary Cardiologist (physician) and Advanced Practice Providers (APPs -  Physician Assistants and Nurse Practitioners) who all work together to provide you with the care you need, when you need it.  We recommend signing up for the patient portal called "MyChart".  Sign up information is provided on this After Visit Summary.  MyChart is used to connect with patients for Virtual Visits (Telemedicine).  Patients are able to view lab/test results, encounter notes, upcoming appointments, etc.  Non-urgent messages can be sent to your provider as well.   To learn more about what you can do with MyChart, go to [email or web address].    Your next appointment:   2 week(s)  Provider:   You may see Lars Mage, MD or one of the following Advanced Practice Providers on your designated Care Team:   Tommye Standard, [place] "Jonni Sanger" [place], [place] Mamie Levers, NP   Other Instructions
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
## 2022-06-05 ENCOUNTER — Encounter: Payer: Self-pay | Admitting: Physician Assistant

## 2022-06-05 ENCOUNTER — Ambulatory Visit: Payer: Medicare PPO | Attending: Physician Assistant | Admitting: Physician Assistant

## 2022-06-05 VITALS — BP 118/74 | HR 71 | Ht 61.0 in | Wt 101.6 lb

## 2022-06-05 DIAGNOSIS — I5032 Chronic diastolic (congestive) heart failure: Secondary | ICD-10-CM | POA: Diagnosis not present

## 2022-06-05 DIAGNOSIS — Z952 Presence of prosthetic heart valve: Secondary | ICD-10-CM | POA: Diagnosis not present

## 2022-06-05 DIAGNOSIS — Z950 Presence of cardiac pacemaker: Secondary | ICD-10-CM

## 2022-06-05 DIAGNOSIS — I480 Paroxysmal atrial fibrillation: Secondary | ICD-10-CM

## 2022-06-05 NOTE — Patient Instructions (Signed)
Medication Instructions:  Your physician recommends that you continue on your current medications as directed. Please refer to the Current Medication list given to you today.  *If you need a refill on your cardiac medications before your next appointment, please call your pharmacy*   Lab Work: None If you have labs (blood work) drawn today and your tests are completely normal, you will receive your results only by: [place] (if you have MyChart) OR A paper copy in the mail If you have any lab test that is abnormal or we need to change your treatment, we will call you to review the results.   Follow-Up: At [facility], you and your health needs are our priority.  As part of our continuing mission to provide you with exceptional heart care, we have created designated Provider Care Teams.  These Care Teams include your primary Cardiologist (physician) and Advanced Practice Providers (APPs -  Physician Assistants and Nurse Practitioners) who all work together to provide you with the care you need, when you need it.   Your next appointment:   3 month(s) with Lars Mage, MD 6 months with Gwyndolyn Kaufman, MD

## 2022-06-06 LAB — CUP PACEART INCLINIC DEVICE CHECK
Date Time Interrogation Session: 20240227122525
Implantable Pulse Generator Implant Date: 20200901

## 2022-06-12 ENCOUNTER — Ambulatory Visit: Payer: Medicare PPO | Admitting: Dermatology

## 2022-06-12 ENCOUNTER — Encounter: Payer: Self-pay | Admitting: Dermatology

## 2022-06-12 DIAGNOSIS — C44519 Basal cell carcinoma of skin of other part of trunk: Secondary | ICD-10-CM

## 2022-06-12 MED ORDER — MUPIROCIN 2 % EX OINT: 1.0000 | TOPICAL_OINTMENT | Freq: Every day | CUTANEOUS | 0 refills | Status: DC

## 2022-06-12 NOTE — Patient Instructions (Signed)

## 2022-06-12 NOTE — Progress Notes (Signed)
   Follow-Up Visit   Subjective  Stacy Cook is a 87 y.o. female who presents for the following: Basal Cell Carcinoma (Biopsy proven of xyphoid - excised today).  The following portions of the chart were reviewed this encounter and updated as appropriate:   Tobacco  Allergies  Meds  Problems  Med Hx  Surg Hx  Fam Hx     Review of Systems:  No other skin or systemic complaints except as noted in HPI or Assessment and Plan.  Objective  Well appearing patient in no apparent distress; mood and affect are within normal limits.  A focused examination was performed including chest. Relevant physical exam findings are noted in the Assessment and Plan.  Xyphoid Healing biopsy site   Assessment & Plan  Basal cell carcinoma (BCC) of skin of other part of torso Xyphoid  Skin excision  Lesion length (cm):  2.2 Lesion width (cm):  1.2 Margin per side (cm):  0.2 Total excision diameter (cm):  2.6 Informed consent: discussed and consent obtained   Timeout: patient name, date of birth, surgical site, and procedure verified   Procedure prep:  Patient was prepped and draped in usual sterile fashion Prep type:  Isopropyl alcohol and povidone-iodine Anesthesia: the lesion was anesthetized in a standard fashion   Anesthetic:  1% lidocaine w/ epinephrine 1-100,000 buffered w/ 8.4% NaHCO3 Instrument used: #15 blade   Hemostasis achieved with: pressure   Hemostasis achieved with comment:  Electrocautery Outcome: patient tolerated procedure well with no complications   Post-procedure details: sterile dressing applied and wound care instructions given   Dressing type: bandage and pressure dressing (mupirocin)    Skin repair Complexity:  Complex Final length (cm):  5 Reason for type of repair: reduce tension to allow closure, reduce the risk of dehiscence, infection, and necrosis, reduce subcutaneous dead space and avoid a hematoma, allow closure of the large defect, preserve normal anatomy,  preserve normal anatomical and functional relationships and enhance both functionality and cosmetic results   Undermining: area extensively undermined   Undermining comment:  Undermining defect 1.6 cm Subcutaneous layers (deep stitches):  Suture size:  3-0 Suture type: Vicryl (polyglactin 910)   Subcutaneous suture technique: inverted dermal. Fine/surface layer approximation (top stitches):  Suture size:  3-0 Suture type: nylon   Stitches: simple running   Suture removal (days):  7 Hemostasis achieved with: suture and pressure Outcome: patient tolerated procedure well with no complications   Post-procedure details: sterile dressing applied and wound care instructions given   Dressing type: bandage and pressure dressing (mupirocin)    mupirocin ointment (BACTROBAN) 2 % Apply 1 Application topically daily. With dressing changes  Specimen 1 - Surgical pathology Differential Diagnosis: Biopsy proven BCC Check Margins: Yes [phone]   Return in about 1 week (around 06/19/2022) for suture removal.  I, Ashok Cordia, CMA, am acting as scribe for Sarina Ser, MD . Documentation: I have reviewed the above documentation for accuracy and completeness, and I agree with the above.  Sarina Ser, MD

## 2022-06-16 ENCOUNTER — Encounter: Payer: Self-pay | Admitting: Dermatology

## 2022-06-19 ENCOUNTER — Ambulatory Visit (INDEPENDENT_AMBULATORY_CARE_PROVIDER_SITE_OTHER): Payer: Medicare PPO | Admitting: Dermatology

## 2022-06-19 ENCOUNTER — Encounter: Payer: Self-pay | Admitting: Dermatology

## 2022-06-19 VITALS — BP 135/61 | HR 98

## 2022-06-19 DIAGNOSIS — Z85828 Personal history of other malignant neoplasm of skin: Secondary | ICD-10-CM

## 2022-06-19 DIAGNOSIS — Z4802 Encounter for removal of sutures: Secondary | ICD-10-CM

## 2022-06-19 NOTE — Progress Notes (Signed)
   Follow-Up Visit   Subjective  Stacy Cook is a 87 y.o. female who presents for the following: Suture / Staple Removal.  The following portions of the chart were reviewed this encounter and updated as appropriate:  Tobacco  Allergies  Meds  Problems  Med Hx  Surg Hx  Fam Hx     Review of Systems: No other skin or systemic complaints except as noted in HPI or Assessment and Plan.  Objective  Well appearing patient in no apparent distress; mood and affect are within normal limits.  A focused examination was performed including chest. Relevant physical exam findings are noted in the Assessment and Plan.   Assessment & Plan   Encounter for Removal of Sutures - Incision site at the xyphoid is clean, dry and intact - Wound cleansed, sutures removed, wound cleansed and steri strips applied.  - Discussed pathology results showing residual BCC, margins free  - Patient advised to keep steri-strips dry until they fall off. - Scars remodel for a full year. - Once steri-strips fall off, patient can apply over-the-counter silicone scar cream each night to help with scar remodeling if desired. - Patient advised to call with any concerns or if they notice any new or changing lesions.   Return for Follow Up As Scheduled.  I, Emelia Salisbury, CMA, am acting as scribe for Sarina Ser, MD. Documentation: I have reviewed the above documentation for accuracy and completeness, and I agree with the above.  Sarina Ser, MD

## 2022-06-19 NOTE — Patient Instructions (Signed)
Due to recent changes in healthcare laws, you may see results of your pathology and/or laboratory studies on MyChart before the doctors have had a chance to review them. We understand that in some cases there may be results that are confusing or concerning to you. Please understand that not all results are received at the same time and often the doctors may need to interpret multiple results in order to provide you with the best plan of care or course of treatment. Therefore, we ask that you please give us 2 business days to thoroughly review all your results before contacting the office for clarification. Should we see a critical lab result, you will be contacted sooner.   If You Need Anything After Your Visit  If you have any questions or concerns for your doctor, please call our main line at [phone] and press option 4 to reach your doctor's medical assistant. If no one answers, please leave a voicemail as directed and we will return your call as soon as possible. Messages left after 4 pm will be answered the following business day.   You may also send us a message via MyChart. We typically respond to MyChart messages within 1-2 business days.  For prescription refills, please ask your pharmacy to contact our office. Our fax number is [phone].  If you have an urgent issue when the clinic is closed that cannot wait until the next business day, you can page your doctor at the number below.    Please note that while we do our best to be available for urgent issues outside of office hours, we are not available 24/7.   If you have an urgent issue and are unable to reach us, you may choose to seek medical care at your doctor's office, retail clinic, urgent care center, or emergency room.  If you have a medical emergency, please immediately call 911 or go to the emergency department.  Pager Numbers  - Dr. Kowalski: [phone]  - Dr. Moye: [phone]  - Dr. Stewart:  [phone]  In the event of inclement weather, please call our main line at [phone] for an update on the status of any delays or closures.  Dermatology Medication Tips: Please keep the boxes that topical medications come in in order to help keep track of the instructions about where and how to use these. Pharmacies typically print the medication instructions only on the boxes and not directly on the medication tubes.   If your medication is too expensive, please contact our office at [phone] option 4 or send us a message through MyChart.   We are unable to tell what your co-pay for medications will be in advance as this is different depending on your insurance coverage. However, we may be able to find a substitute medication at lower cost or fill out paperwork to get insurance to cover a needed medication.   If a prior authorization is required to get your medication covered by your insurance company, please allow us 1-2 business days to complete this process.  Drug prices often vary depending on where the prescription is filled and some pharmacies may offer cheaper prices.  The website www.goodrx.com contains coupons for medications through different pharmacies. The prices here do not account for what the cost may be with help from insurance (it may be cheaper with your insurance), but the website can give you the price if you did not use any insurance.  - You can print the associated coupon and take it with   your prescription to the pharmacy.  - You may also stop by our office during regular business hours and pick up a GoodRx coupon card.  - If you need your prescription sent electronically to a different pharmacy, notify our office through [place] MyChart or by phone at [phone] option 4.     Si Usted Necesita Algo Despus de Su Visita  Tambin puede enviarnos un mensaje a travs de MyChart. Por lo general respondemos a los mensajes de MyChart en el transcurso de 1 a 2  das hbiles.  Para renovar recetas, por favor pida a su farmacia que se ponga en contacto con nuestra oficina. Nuestro nmero de fax es el [phone].  Si tiene un asunto urgente cuando la clnica est cerrada y que no puede esperar hasta el siguiente da hbil, puede llamar/localizar a su doctor(a) al nmero que aparece a continuacin.   Por favor, tenga en cuenta que aunque hacemos todo lo posible para estar disponibles para asuntos urgentes fuera del horario de oficina, no estamos disponibles las 24 horas del da, los 7 das de la semana.   Si tiene un problema urgente y no puede comunicarse con nosotros, puede optar por buscar atencin mdica  en el consultorio de su doctor(a), en una clnica privada, en un centro de atencin urgente o en una sala de emergencias.  Si tiene una emergencia mdica, por favor llame inmediatamente al 911 o vaya a la sala de emergencias.  Nmeros de bper  - Dr. Kowalski: [phone]  - Dra. Moye: [phone]  - Dra. Stewart: [phone]  En caso de inclemencias del tiempo, por favor llame a nuestra lnea principal al [phone] para una actualizacin sobre el estado de cualquier retraso o cierre.  Consejos para la medicacin en dermatologa: Por favor, guarde las cajas en las que vienen los medicamentos de uso tpico para ayudarle a seguir las instrucciones sobre dnde y cmo usarlos. Las farmacias generalmente imprimen las instrucciones del medicamento slo en las cajas y no directamente en los tubos del medicamento.   Si su medicamento es muy caro, por favor, pngase en contacto con nuestra oficina llamando al [phone] y presione la opcin 4 o envenos un mensaje a travs de MyChart.   No podemos decirle cul ser su copago por los medicamentos por adelantado ya que esto es diferente dependiendo de la cobertura de su seguro. Sin embargo, es posible que podamos encontrar un medicamento sustituto a menor costo o llenar un formulario para que el  seguro cubra el medicamento que se considera necesario.   Si se requiere una autorizacin previa para que su compaa de seguros cubra su medicamento, por favor permtanos de 1 a 2 das hbiles para completar este proceso.  Los precios de los medicamentos varan con frecuencia dependiendo del lugar de dnde se surte la receta y alguna farmacias pueden ofrecer precios ms baratos.  El sitio web www.goodrx.com tiene cupones para medicamentos de diferentes farmacias. Los precios aqu no tienen en cuenta lo que podra costar con la ayuda del seguro (puede ser ms barato con su seguro), pero el sitio web puede darle el precio si no utiliz ningn seguro.  - Puede imprimir el cupn correspondiente y llevarlo con su receta a la farmacia.  - Tambin puede pasar por nuestra oficina durante el horario de atencin regular y recoger una tarjeta de cupones de GoodRx.  - Si necesita que su receta se enve electrnicamente a una farmacia diferente, informe a nuestra oficina a travs de MyChart de [place]   o por telfono llamando al [phone] y presione la opcin 4.  

## 2022-06-22 ENCOUNTER — Encounter: Payer: Self-pay | Admitting: Dermatology

## 2022-07-06 ENCOUNTER — Ambulatory Visit (INDEPENDENT_AMBULATORY_CARE_PROVIDER_SITE_OTHER): Payer: Medicare PPO

## 2022-07-06 DIAGNOSIS — I459 Conduction disorder, unspecified: Secondary | ICD-10-CM | POA: Diagnosis not present

## 2022-07-08 LAB — CUP PACEART REMOTE DEVICE CHECK
Battery Remaining Longevity: 68 mo
Battery Voltage: 2.96 V
Brady Statistic AS VP Percent: 74.73 %
Brady Statistic AS VS Percent: 0 %
Brady Statistic RV Percent Paced: 99.26 %
Date Time Interrogation Session: 20240329121500
Implantable Pulse Generator Implant Date: 20200901
Lead Channel Impedance Value: 460 Ohm
Lead Channel Pacing Threshold Amplitude: 0.875 V
Lead Channel Pacing Threshold Pulse Width: 0.24 ms
Lead Channel Sensing Intrinsic Amplitude: 4.05 mV
Lead Channel Setting Pacing Amplitude: 1.375
Lead Channel Setting Pacing Pulse Width: 0.24 ms
Lead Channel Setting Sensing Sensitivity: 2.8 mV

## 2022-07-12 MED FILL — Iron Sucrose Inj 20 MG/ML (Fe Equiv): INTRAVENOUS | Qty: 10 | Status: AC

## 2022-07-13 ENCOUNTER — Encounter: Payer: Self-pay | Admitting: Internal Medicine

## 2022-07-13 ENCOUNTER — Inpatient Hospital Stay: Payer: Medicare PPO | Admitting: Internal Medicine

## 2022-07-13 ENCOUNTER — Inpatient Hospital Stay: Payer: Medicare PPO | Attending: Internal Medicine

## 2022-07-13 ENCOUNTER — Inpatient Hospital Stay: Payer: Medicare PPO

## 2022-07-13 VITALS — BP 158/54 | HR 64 | Temp 96.8°F | Resp 16 | Wt 103.0 lb

## 2022-07-13 DIAGNOSIS — E785 Hyperlipidemia, unspecified: Secondary | ICD-10-CM | POA: Diagnosis not present

## 2022-07-13 DIAGNOSIS — I129 Hypertensive chronic kidney disease with stage 1 through stage 4 chronic kidney disease, or unspecified chronic kidney disease: Secondary | ICD-10-CM | POA: Insufficient documentation

## 2022-07-13 DIAGNOSIS — D649 Anemia, unspecified: Secondary | ICD-10-CM | POA: Insufficient documentation

## 2022-07-13 DIAGNOSIS — J90 Pleural effusion, not elsewhere classified: Secondary | ICD-10-CM | POA: Diagnosis not present

## 2022-07-13 DIAGNOSIS — D693 Immune thrombocytopenic purpura: Secondary | ICD-10-CM | POA: Diagnosis not present

## 2022-07-13 DIAGNOSIS — N179 Acute kidney failure, unspecified: Secondary | ICD-10-CM | POA: Insufficient documentation

## 2022-07-13 DIAGNOSIS — K219 Gastro-esophageal reflux disease without esophagitis: Secondary | ICD-10-CM | POA: Diagnosis not present

## 2022-07-13 DIAGNOSIS — E1122 Type 2 diabetes mellitus with diabetic chronic kidney disease: Secondary | ICD-10-CM | POA: Diagnosis not present

## 2022-07-13 DIAGNOSIS — Z85028 Personal history of other malignant neoplasm of stomach: Secondary | ICD-10-CM | POA: Insufficient documentation

## 2022-07-13 DIAGNOSIS — J45909 Unspecified asthma, uncomplicated: Secondary | ICD-10-CM | POA: Insufficient documentation

## 2022-07-13 DIAGNOSIS — Z8673 Personal history of transient ischemic attack (TIA), and cerebral infarction without residual deficits: Secondary | ICD-10-CM | POA: Insufficient documentation

## 2022-07-13 DIAGNOSIS — Z85828 Personal history of other malignant neoplasm of skin: Secondary | ICD-10-CM | POA: Insufficient documentation

## 2022-07-13 DIAGNOSIS — C3431 Malignant neoplasm of lower lobe, right bronchus or lung: Secondary | ICD-10-CM | POA: Diagnosis not present

## 2022-07-13 DIAGNOSIS — E1151 Type 2 diabetes mellitus with diabetic peripheral angiopathy without gangrene: Secondary | ICD-10-CM | POA: Insufficient documentation

## 2022-07-13 DIAGNOSIS — I4891 Unspecified atrial fibrillation: Secondary | ICD-10-CM | POA: Insufficient documentation

## 2022-07-13 DIAGNOSIS — Z8616 Personal history of COVID-19: Secondary | ICD-10-CM | POA: Diagnosis not present

## 2022-07-13 DIAGNOSIS — N184 Chronic kidney disease, stage 4 (severe): Secondary | ICD-10-CM | POA: Diagnosis not present

## 2022-07-13 DIAGNOSIS — Z87442 Personal history of urinary calculi: Secondary | ICD-10-CM | POA: Insufficient documentation

## 2022-07-13 DIAGNOSIS — I2510 Atherosclerotic heart disease of native coronary artery without angina pectoris: Secondary | ICD-10-CM | POA: Diagnosis not present

## 2022-07-13 LAB — COMPREHENSIVE METABOLIC PANEL
ALT: 21 U/L (ref 0–44)
AST: 29 U/L (ref 15–41)
Albumin: 4 g/dL (ref 3.5–5.0)
Alkaline Phosphatase: 97 U/L (ref 38–126)
Anion gap: 6 (ref 5–15)
BUN: 32 mg/dL — ABNORMAL HIGH (ref 8–23)
CO2: 26 mmol/L (ref 22–32)
Calcium: 9.1 mg/dL (ref 8.9–10.3)
Chloride: 106 mmol/L (ref 98–111)
Creatinine, Ser: 1.34 mg/dL — ABNORMAL HIGH (ref 0.44–1.00)
GFR, Estimated: 39 mL/min — ABNORMAL LOW (ref >60)
Glucose, Bld: 82 mg/dL (ref 70–99)
Potassium: 4.2 mmol/L (ref 3.5–5.1)
Sodium: 138 mmol/L (ref 135–145)
Total Bilirubin: 0.4 mg/dL (ref 0.3–1.2)
Total Protein: 7 g/dL (ref 6.5–8.1)

## 2022-07-13 LAB — CBC WITH DIFFERENTIAL/PLATELET
Abs Immature Granulocytes: 0.01 10*3/uL (ref 0.00–0.07)
Basophils Absolute: 0 10*3/uL (ref 0.0–0.1)
Basophils Relative: 0 %
Eosinophils Absolute: 0.1 10*3/uL (ref 0.0–0.5)
Eosinophils Relative: 2 %
HCT: 33.7 % — ABNORMAL LOW (ref 36.0–46.0)
Hemoglobin: 11.1 g/dL — ABNORMAL LOW (ref 12.0–15.0)
Immature Granulocytes: 0 %
Lymphocytes Relative: 16 %
Lymphs Abs: 0.8 10*3/uL (ref 0.7–4.0)
MCH: 29.9 pg (ref 26.0–34.0)
MCHC: 32.9 g/dL (ref 30.0–36.0)
MCV: 90.8 fL (ref 80.0–100.0)
Monocytes Absolute: 0.4 10*3/uL (ref 0.1–1.0)
Monocytes Relative: 7 %
Neutro Abs: 3.7 10*3/uL (ref 1.7–7.7)
Neutrophils Relative %: 75 %
Platelets: 119 10*3/uL — ABNORMAL LOW (ref 150–400)
RBC: 3.71 MIL/uL — ABNORMAL LOW (ref 3.87–5.11)
RDW: 12.7 % (ref 11.5–15.5)
WBC: 5 10*3/uL (ref 4.0–10.5)
nRBC: 0 % (ref 0.0–0.2)

## 2022-07-13 LAB — LACTATE DEHYDROGENASE: LDH: 165 U/L (ref 98–192)

## 2022-07-13 NOTE — Progress Notes (Signed)
[facility] OFFICE PROGRESS NOTE  Patient Care Team: Lynnea Ferrier, MD as PCP - General (Internal Medicine) Earna Coder, MD as Consulting Physician (Internal Medicine) Stanton Kidney, MD as Consulting Physician (Gastroenterology) Lanier Prude, MD as Consulting Physician (Cardiology)   Cancer Staging  Primary malignant neoplasm of right lower lobe of lung Staging form: Lung, AJCC 7th Edition - Clinical: No stage assigned - Unsigned    Oncology History Overview Note  # DEC 2017- Adeno ca [GATA; her 2 Neu-NEG]; signet ring [1.5 x3 mm gastric incisura; Dr.Skulskie]; EUS [Dr.Burnbridge]; no significant abnormality noted;  Reviewed at [facility] also. JAN 2018- PET NED. April 2018- S/p partial gastrectomy [Dr.Sankar]- STAGE I ADENO CA; NO adjuvant therapy.  # STAGE I CARCINOID s/p partial gastrectomy   # May 2018- Chronic Atrophic gastritis- Prevpac   # FEB 2017- ADENOCARCINOMA with Lepidic 80%-20% acinar pattern; pT2a [Stage IB;T-2.3cm; visceral pleural invasion present; pN=0 ]; AUG 2017- CT NED;   # DEC 2019- RECURRENT/STAGE IV ADENO LUNG CA- EGFR MUTATED;# Jan 6th 2020-; START Osidemrtinib;  # AUG 2020- SEVERE AS [s/p TAVR; [place]]-complicated by R sided stroke/ seizures/acute renal failure.   # DEC 2nd 2020- START PROMACTA 25 mg/day [ITP]  # Molecular testing: EFGR mutated [id number] [omniseq]  # Palliative: O-1/20   DIAGNOSIS:  #ADENO CA LUNG-STAGE IV #Stomach adeno ca [stage I; dec 2017]  GOALS: pallaitive  CURRENT/MOST RECENT THERAPY - OSIMERTINIB [Jan 6th 2020]      Primary malignant neoplasm of right lower lobe of lung  12/02/2015 Initial Diagnosis   Primary malignant neoplasm of right lower lobe of lung (HCC)   Adenocarcinoma of gastric cardia   INTERVAL HISTORY: Alone.  Ambulating independantly.   Stacy Cook 87 y.o.  female pleasant patient above history of recurrent/stage IV adenocarcinoma lung/EGFR mutated currently on  osimertinib; ITP on promacta is here for follow-up.  Patient in for follow up, reports she is doing well, feels appetite is improved even though weight is down. Pt states she has basal cell skin cancer removed in March by Dr Gwen Pounds.  Recently started on eliquis 2.5 mg BID by cardiology. No hemoptysis.  Denies any worsening swelling of the legs.  Patient states that she is compliant with her oral iron.  No constipation.  Review of Systems  Constitutional:  Positive for malaise/fatigue. Negative for chills, diaphoresis and fever.  HENT:  Negative for nosebleeds and sore throat.   Eyes:  Negative for double vision.  Respiratory:  Negative for hemoptysis, sputum production and wheezing.   Cardiovascular:  Negative for chest pain, palpitations, orthopnea and leg swelling.  Gastrointestinal:  Negative for blood in stool, constipation, melena, nausea and vomiting.  Genitourinary:  Negative for dysuria, frequency and urgency.  Musculoskeletal:  Positive for back pain and joint pain.  Neurological:  Positive for focal weakness. Negative for dizziness, tingling, weakness and headaches.  Endo/Heme/Allergies:  Bruises/bleeds easily.  Psychiatric/Behavioral:  Negative for depression. The patient is not nervous/anxious and does not have insomnia.       PAST MEDICAL HISTORY :  Past Medical History:  Diagnosis Date   Abdominal pain    Acid reflux 03/24/2015   Adenocarcinoma of gastric cardia 04/10/2016   Allergic genetic state    Anemia    Arteriosclerosis of coronary artery 03/24/2015   Arthritis    "back, hands" (10/15/2017)   Asthma    B12 deficiency anemia    Basal cell carcinoma 01/01/2022   glabella, [facility]   Basal cell  carcinoma 05/03/2022   R ant deltoid, [facility]   Basal cell carcinoma 05/03/2022   Xyphoid excised 06/12/2022   Bile reflux gastritis    CAD (coronary artery disease)    Cancer of right lung 05/09/2015   Dr. Thelma Barge performed Right lower lobe lobectomy.    Carcinoma in situ of  body of stomach 08/03/2016   Carotid stenosis 02/07/2016   Cataract cortical, senile    Chest pain    Colon polyp    Degeneration of intervertebral disc of lumbar region 03/11/2014   Diabetes    Gastric adenocarcinoma    GERD (gastroesophageal reflux disease)    also, history of ulcers   History of kidney stones    History of stroke    HLD (hyperlipidemia) 08/24/2013   Hypertension    Malignant tumor of stomach 07/2016   Adenocarcinoma, diffuse, poorly differentiated, signet ring, stage I   Neuritis or radiculitis due to rupture of lumbar intervertebral disc 09/10/2014   Neuroendocrine tumor 03/24/2015   Neuroma    Osteoporosis    Peripheral vascular disease    PONV (postoperative nausea and vomiting)    Presence of permanent cardiac pacemaker 12/09/2018   Primary malignant neoplasm of right lower lobe of lung 12/02/2015   Renal insufficiency    Renal stone    S/P TAVR (transcatheter aortic valve replacement)    Sciatica    Severe aortic stenosis    Skin cancer    "cut/burned LUE; cut off right eye/nose & cut off chest" (10/15/2017)   Stomach ulcer    Thrombocytopenia    Type 2 diabetes, diet controlled    "no RX since stomach OR 07/2016" (10/15/2017)    PAST SURGICAL HISTORY :   Past Surgical History:  Procedure Laterality Date   APPENDECTOMY     CARDIAC CATHETERIZATION  X2 before 10/15/2017   CARDIAC VALVE REPLACEMENT     CATARACT EXTRACTION W/PHACO Left 04/04/2017   Procedure: CATARACT EXTRACTION PHACO AND INTRAOCULAR LENS PLACEMENT (IOC);  Surgeon: Lockie Mola, MD;  Location: [facility] ORS;  Service: Ophthalmology;  Laterality: Left;  Lot # [id number] H [place] 1:00 Ap 25% CDE 8.54   CATARACT EXTRACTION W/PHACO Right 05/15/2017   Procedure: CATARACT EXTRACTION PHACO AND INTRAOCULAR LENS PLACEMENT (IOC);  Surgeon: Lockie Mola, MD;  Location: [facility];  Service: Ophthalmology;  Laterality: Right;  [place] 01:10 AP% 18.3 CDE 12.91 Fluid pack lot # [id number]   COLONOSCOPY      CORONARY ATHERECTOMY N/A 10/15/2017   Procedure: CORONARY ATHERECTOMY;  Surgeon: Kathleene Hazel, MD;  Location: [facility];  Service: Cardiovascular;  Laterality: N/A;   CORONARY STENT INTERVENTION N/A 10/15/2017   Procedure: CORONARY STENT INTERVENTION;  Surgeon: Kathleene Hazel, MD;  Location: [facility];  Service: Cardiovascular;  Laterality: N/A;   CYSTOSCOPY/URETEROSCOPY/HOLMIUM LASER/STENT PLACEMENT Left 08/03/2019   Procedure: CYSTOSCOPY/URETEROSCOPY/HOLMIUM LASER/STENT PLACEMENT;  Surgeon: Vanna Scotland, MD;  Location: [facility];  Service: Urology;  Laterality: Left;   ESOPHAGOGASTRODUODENOSCOPY     ESOPHAGOGASTRODUODENOSCOPY (EGD) WITH PROPOFOL N/A 03/27/2016   Procedure: ESOPHAGOGASTRODUODENOSCOPY (EGD) WITH PROPOFOL;  Surgeon: Christena Deem, MD;  Location: [facility] ENDOSCOPY;  Service: Endoscopy;  Laterality: N/A;   ESOPHAGOGASTRODUODENOSCOPY (EGD) WITH PROPOFOL N/A 05/28/2016   Procedure: ESOPHAGOGASTRODUODENOSCOPY (EGD) WITH PROPOFOL;  Surgeon: Christena Deem, MD;  Location: [facility] ENDOSCOPY;  Service: Endoscopy;  Laterality: N/A;   ESOPHAGOGASTRODUODENOSCOPY (EGD) WITH PROPOFOL N/A 09/25/2016   Procedure: ESOPHAGOGASTRODUODENOSCOPY (EGD) WITH PROPOFOL;  Surgeon: Kieth Brightly, MD;  Location: [facility] ENDOSCOPY;  Service: Endoscopy;  Laterality:  N/A;   ESOPHAGOGASTRODUODENOSCOPY (EGD) WITH PROPOFOL N/A 12/18/2016   Procedure: ESOPHAGOGASTRODUODENOSCOPY (EGD) WITH PROPOFOL;  Surgeon: Kieth Brightly, MD;  Location: [facility] ENDOSCOPY;  Service: Endoscopy;  Laterality: N/A;   ESOPHAGOGASTRODUODENOSCOPY (EGD) WITH PROPOFOL N/A 01/23/2017   Procedure: ESOPHAGOGASTRODUODENOSCOPY (EGD) WITH PROPOFOL;  Surgeon: Kieth Brightly, MD;  Location: [facility] ENDOSCOPY;  Service: Endoscopy;  Laterality: N/A;   ESOPHAGOGASTRODUODENOSCOPY (EGD) WITH PROPOFOL N/A 03/20/2017   Procedure: ESOPHAGOGASTRODUODENOSCOPY (EGD) WITH PROPOFOL;  Surgeon: Kieth Brightly, MD;  Location: [facility] ENDOSCOPY;  Service: Endoscopy;  Laterality: N/A;   ESOPHAGOGASTRODUODENOSCOPY (EGD) WITH PROPOFOL N/A 09/23/2020   Procedure: ESOPHAGOGASTRODUODENOSCOPY (EGD) WITH PROPOFOL;  Surgeon: Regis Bill, MD;  Location: [facility] ENDOSCOPY;  Service: Endoscopy;  Laterality: N/A;  [facility] SAYS "AMPICILLIN IS NEEDED"   FRACTURE SURGERY     INSERT / REPLACE / REMOVE PACEMAKER  12/09/2018   PACEMAKER LEADLESS INSERTION N/A 12/09/2018   Procedure: PACEMAKER LEADLESS INSERTION;  Surgeon: Hillis Range, MD;  [facility]: [facility];  Service: Cardiovascular;  Laterality: N/A;   PARTIAL GASTRECTOMY N/A 08/03/2016   Hemigastrectomy, Billroth I reconstruction Surgeon: Kieth Brightly, MD;  Location: [facility];  Service: General;  Laterality: N/A;   RIGHT/LEFT HEART CATH AND CORONARY ANGIOGRAPHY Bilateral 09/19/2017   Procedure: RIGHT/LEFT HEART CATH AND CORONARY ANGIOGRAPHY;  Surgeon: Alwyn Pea, MD;  Location: [facility];  Service: Cardiovascular;  Laterality: Bilateral;   SHOULDER ARTHROSCOPY W/ CAPSULAR REPAIR Right    SKIN CANCER EXCISION     "cut/burned LUE; cut off right eye/nose & cut off chest" (10/15/2017)   TEE WITHOUT CARDIOVERSION N/A 12/02/2018   Procedure: TRANSESOPHAGEAL ECHOCARDIOGRAM (TEE);  Surgeon: Kathleene Hazel, MD;  Location: [facility] INVASIVE CV LAB;  Service: Open Heart Surgery;  Laterality: N/A;   THORACOTOMY Right 05/09/2015   Procedure: THORACOTOMY, RIGHT LOWER LOBECTOMY, BRONCHOSCOPY;  Surgeon: Hulda Marin, MD;  Location: [facility] OR[facility];  Service: Thoracic;  Laterality: Right;   TONSILLECTOMY  1944   TRANSCATHETER AORTIC VALVE REPLACEMENT, TRANSFEMORAL N/A 12/02/2018   Procedure: TRANSCATHETER AORTIC VALVE REPLACEMENT, TRANSFEMORAL;  Surgeon: Kathleene Hazel, MD;  [facility];  Service: Open Heart Surgery;  Laterality: N/A;   TUBAL LIGATION     UPPER GI ENDOSCOPY N/A 08/03/2016   Procedure: UPPER   ENDOSCOPY;  Surgeon: Kieth Brightly, MD;  Location: [facility] OR[facility];  Service: General;  Laterality: N/A;   VAGINAL HYSTERECTOMY     WRIST FRACTURE SURGERY Right     FAMILY HISTORY :   Family History  Problem Relation Age of Onset   Aortic aneurysm Mother    Kidney Stones Mother    Hypertension Mother    Hypertension Father    Diabetes Other    Breast cancer Neg Hx    Prostate cancer Neg Hx    Bladder Cancer Neg Hx    Kidney cancer Neg Hx     SOCIAL HISTORY:   Social History   Tobacco Use   Smoking status: Never   Smokeless tobacco: Never  Vaping Use   Vaping Use: Never used  Substance Use Topics   Alcohol use: Not Currently   Drug use: Never    ALLERGIES:  is allergic to mirtazapine, pneumococcal vaccine, statins, and sulfa antibiotics.  MEDICATIONS:  Current Outpatient Medications  Medication Sig Dispense Refill   apixaban (ELIQUIS) 2.5 MG TABS tablet Take 1 tablet (2.5 mg total) by mouth 2 (two) times daily. 60 tablet 2   cholecalciferol (VITAMIN D) 1000 UNITS tablet Take 1,000  Units by mouth daily.     cyanocobalamin 1000 MCG tablet Take 1,000 mcg by mouth daily.      eltrombopag (PROMACTA) 25 MG tablet TAKE 1 TABLET BY MOUTH DAILY. TAKE ON AN EMPTY STOMACH, 1 HOUR BEFORE A MEAL OR 2 HOURS AFTER. 30 tablet 6   ezetimibe (ZETIA) 10 MG tablet Take 10 mg by mouth daily.     ferrous sulfate 325 (65 FE) MG tablet Take 325 mg by mouth daily with breakfast.     isosorbide mononitrate (IMDUR) 30 MG 24 hr tablet Take 1 tablet by mouth once daily 90 tablet 2   metoprolol succinate (TOPROL-XL) 50 MG 24 hr tablet Take 1 tablet (50 mg total) by mouth every evening. Take with or immediately following a meal. 30 tablet 0   mupirocin ointment (BACTROBAN) 2 % Apply 1 Application topically daily. With dressing changes 22 g 0   osimertinib mesylate (TAGRISSO) 80 MG tablet Take 1 tablet (80 mg total) by mouth daily. 30 tablet 5   potassium citrate (UROCIT-K) 10 MEQ (1080 MG) SR tablet  Take 1 tablet (10 mEq total) by mouth daily. 90 tablet 0   Magnesium 100 MG TABS Take by mouth. (Patient not taking: Reported on 07/13/2022)     No current facility-administered medications for this visit.    PHYSICAL EXAMINATION: ECOG PERFORMANCE STATUS: 0 - Asymptomatic  BP (!) 158/54 (BP Location: Right Arm, Patient Position: Sitting)   Pulse 64   Temp (!) 96.8 F (36 C) (Tympanic)   Resp 16   Wt 103 lb (46.7 kg)   SpO2 99%   BMI 19.46 kg/m   Filed Weights   07/13/22 1503  Weight: 103 lb (46.7 kg)   Decreased breath sounds on the right side compared to left.  Physical Exam Constitutional:      Comments: Patient walking by herself; no assistive devices.  Alone.  HENT:     Head: Normocephalic and atraumatic.     Mouth/Throat:     Pharynx: No oropharyngeal exudate.  Eyes:     Pupils: Pupils are equal, round, and reactive to light.  Cardiovascular:     Rate and Rhythm: Normal rate and regular rhythm.     Heart sounds: Murmur heard.  Pulmonary:     Effort: No respiratory distress.     Breath sounds: No wheezing.  Abdominal:     General: Bowel sounds are normal. There is no distension.     Palpations: Abdomen is soft. There is no mass.     Tenderness: There is no abdominal tenderness. There is no guarding or rebound.  Musculoskeletal:        General: No tenderness. Normal range of motion.     Cervical back: Normal range of motion and neck supple.  Skin:    General: Skin is warm.  Neurological:     Mental Status: She is alert and oriented to person, place, and time.     Comments: Weakness of the right upper extremity more than lower extremity.  Psychiatric:        Mood and Affect: Affect normal.    LABORATORY DATA:  I have reviewed the data as listed    Component Value Date/Time   NA 138 07/13/2022 1431   NA 141 02/05/2022 1405   NA 141 07/21/2012 1529   K 4.2 07/13/2022 1431   K 3.9 07/21/2012 1529   CL 106 07/13/2022 1431   CL 109 (H) 07/21/2012 1529    CO2 26 07/13/2022 1431  CO2 27 07/21/2012 1529   GLUCOSE 82 07/13/2022 1431   GLUCOSE 104 (H) 07/21/2012 1529   BUN 32 (H) 07/13/2022 1431   BUN 34 (H) 02/05/2022 1405   BUN 16 07/21/2012 1529   CREATININE 1.34 (H) 07/13/2022 1431   CREATININE 1.11 09/03/2012 1535   CALCIUM 9.1 07/13/2022 1431   CALCIUM 8.9 07/21/2012 1529   PROT 7.0 07/13/2022 1431   PROT 7.1 07/21/2012 1529   ALBUMIN 4.0 07/13/2022 1431   ALBUMIN 3.9 07/21/2012 1529   AST 29 07/13/2022 1431   AST 26 07/21/2012 1529   ALT 21 07/13/2022 1431   ALT 22 07/21/2012 1529   ALKPHOS 97 07/13/2022 1431   ALKPHOS 76 07/21/2012 1529   BILITOT 0.4 07/13/2022 1431   BILITOT 0.3 07/21/2012 1529   GFRNONAA 39 (L) 07/13/2022 1431   GFRNONAA 49 (L) 09/03/2012 1535   GFRAA 29 (L) 01/05/2020 1440   GFRAA 57 (L) 09/03/2012 1535    No results found for: "SPEP", "UPEP"  Lab Results  Component Value Date   WBC 5.0 07/13/2022   NEUTROABS 3.7 07/13/2022   HGB 11.1 (L) 07/13/2022   HCT 33.7 (L) 07/13/2022   MCV 90.8 07/13/2022   PLT 119 (L) 07/13/2022      Chemistry      Component Value Date/Time   NA 138 07/13/2022 1431   NA 141 02/05/2022 1405   NA 141 07/21/2012 1529   K 4.2 07/13/2022 1431   K 3.9 07/21/2012 1529   CL 106 07/13/2022 1431   CL 109 (H) 07/21/2012 1529   CO2 26 07/13/2022 1431   CO2 27 07/21/2012 1529   BUN 32 (H) 07/13/2022 1431   BUN 34 (H) 02/05/2022 1405   BUN 16 07/21/2012 1529   CREATININE 1.34 (H) 07/13/2022 1431   CREATININE 1.11 09/03/2012 1535      Component Value Date/Time   CALCIUM 9.1 07/13/2022 1431   CALCIUM 8.9 07/21/2012 1529   ALKPHOS 97 07/13/2022 1431   ALKPHOS 76 07/21/2012 1529   AST 29 07/13/2022 1431   AST 26 07/21/2012 1529   ALT 21 07/13/2022 1431   ALT 22 07/21/2012 1529   BILITOT 0.4 07/13/2022 1431   BILITOT 0.3 07/21/2012 1529       RADIOGRAPHIC STUDIES: I have personally reviewed the radiological images as listed and agreed with the findings in the  report. No results found.   ASSESSMENT & PLAN:  Primary malignant neoplasm of right lower lobe of lung (HCC) # Stage IV recurrent adenocarcinoma the lung; EGFR mutated. On osimertinib 80 mg [Feb 6th 2020]. JAN 31st, 2024- CT CAP- non-contrast- Right lower lobectomy. No definitive evidence of disease;  Slight interval decrease in size of large chronic appearing and largely sub pulmonic pleural effusion in the RIGHT chest. Small pulmonary nodule in the LEFT lower lobe is indistinct at 3-4 mm and is stable compared to previous imaging.  Will again repeat imaging in 2 to 3 months./Ordered next visit.   # currently on Osimertinib 80 mg/day; tolerating well. See above/ Continue current therapy-no obvious evidence of recurrent malignancy at this time. Thoracentesis negative any malignant cells.  stable  #Right pleural effusion-status postthoracentesis-pleural fluid: Transudative; cytology negative for malignancy.  Question CHF/fluid overload.  S/p Lasix 20 mg a day- improved/stable. Currently not on any lasix [ARF].  stable  # Worsening anemia- Hb 11.10 Dec 2021- Iron sat- 17%; ferritin- 44- continue Iron pills once day. Hold off Venofer.   # ITP- chronic- platelets> 100 on promacta 25  mg/day-  stable  # Stomach cancer stage I status post gastrectomy; June 2022- S/p EGD- [facility].- stable  # A-fib on Eliquis 2.5/cardiology.  Discussed regarding bleeding precautions.  # right sided stroke/ seizures-on asprin; off plavix;; f/u neurology-[place]-  stable  # CKD- stage IV; Left Hydronephrosis s/p ureteral stent placement/nephrolithiasis-~ GFR 25 today; Hypokalemia-4.3 continue K-citrate q day. stable  # IV ACCESS: PIV  # DISPOSITION: # no venofer today # follow up in 2 months- MD: labs- cbc/cmp;LDH; iron studies; ferritin- possible IV venofer-  Dr.B      Orders Placed This Encounter  Procedures   CBC with Differential (Cancer Center Only)    Standing Status:   Future    Standing Expiration Date:    07/09/2023   Ferritin    Standing Status:   Future    Standing Expiration Date:   07/13/2023   Iron and TIBC    Standing Status:   Future    Standing Expiration Date:   07/13/2023   Lactate dehydrogenase    Standing Status:   Future    Standing Expiration Date:   07/13/2023   CMP (Cancer Center only)    Standing Status:   Future    Standing Expiration Date:   07/13/2023   All questions were answered. The patient knows to call the clinic with any problems, questions or concerns.      Earna CoderGovinda R Skylin Kennerson, MD 07/13/2022 3:53 PM

## 2022-07-13 NOTE — Progress Notes (Signed)
Pt in for follow up, reports she is doing well, feels appetite is improved even though weight is down. Pt states she has basal cell skin cancer removed in March by Dr Gwen Pounds.

## 2022-07-13 NOTE — Assessment & Plan Note (Addendum)
#   Stage IV recurrent adenocarcinoma the lung; EGFR mutated. On osimertinib 80 mg [Feb 6th 2020]. JAN 31st, 2024- CT CAP- non-contrast- Right lower lobectomy. No definitive evidence of disease;  Slight interval decrease in size of large chronic appearing and largely sub pulmonic pleural effusion in the RIGHT chest. Small pulmonary nodule in the LEFT lower lobe is indistinct at 3-4 mm and is stable compared to previous imaging.  Will again repeat imaging in 2 to 3 months./Ordered next visit.   # currently on Osimertinib 80 mg/day; tolerating well. See above/ Continue current therapy-no obvious evidence of recurrent malignancy at this time. Thoracentesis negative any malignant cells.  stable  #Right pleural effusion-status postthoracentesis-pleural fluid: Transudative; cytology negative for malignancy.  Question CHF/fluid overload.  S/p Lasix 20 mg a day- improved/stable. Currently not on any lasix [ARF].  stable  # Worsening anemia- Hb 11.10 Dec 2021- Iron sat- 17%; ferritin- 44- continue Iron pills once day. Hold off Venofer.   # ITP- chronic- platelets> 100 on promacta 25 mg/day-  stable  # Stomach cancer stage I status post gastrectomy; June 2022- S/p EGD- [facility].- stable  # A-fib on Eliquis 2.5/cardiology.  Discussed regarding bleeding precautions.  # right sided stroke/ seizures-on asprin; off plavix;; f/u neurology-[place]-  stable  # CKD- stage IV; Left Hydronephrosis s/p ureteral stent placement/nephrolithiasis-~ GFR 25 today; Hypokalemia-4.3 continue K-citrate q day. stable  # IV ACCESS: PIV  # DISPOSITION: # no venofer today # follow up in 2 months- MD: labs- cbc/cmp;LDH; iron studies; ferritin- possible IV venofer-  Dr.B

## 2022-08-08 NOTE — Progress Notes (Signed)
Remote pacemaker transmission.   

## 2022-08-09 ENCOUNTER — Other Ambulatory Visit: Payer: Self-pay | Admitting: Pharmacist

## 2022-08-09 DIAGNOSIS — C3431 Malignant neoplasm of lower lobe, right bronchus or lung: Secondary | ICD-10-CM

## 2022-08-09 MED ORDER — OSIMERTINIB MESYLATE 80 MG PO TABS
80.0000 mg | ORAL_TABLET | Freq: Every day | ORAL | 11 refills | Status: DC
Start: 2022-08-09 — End: 2023-04-11

## 2022-08-16 ENCOUNTER — Other Ambulatory Visit: Payer: Self-pay | Admitting: Pharmacist

## 2022-08-16 DIAGNOSIS — D693 Immune thrombocytopenic purpura: Secondary | ICD-10-CM

## 2022-08-16 MED ORDER — ELTROMBOPAG OLAMINE 25 MG PO TABS
25.0000 mg | ORAL_TABLET | Freq: Every day | ORAL | 7 refills | Status: DC
Start: 2022-08-16 — End: 2023-04-15

## 2022-09-04 ENCOUNTER — Other Ambulatory Visit: Payer: Self-pay | Admitting: Nurse Practitioner

## 2022-09-04 ENCOUNTER — Other Ambulatory Visit: Payer: Self-pay | Admitting: Internal Medicine

## 2022-09-04 DIAGNOSIS — I480 Paroxysmal atrial fibrillation: Secondary | ICD-10-CM

## 2022-09-04 NOTE — Telephone Encounter (Signed)
Prescription refill request for Eliquis received. Indication: a fib Last office visit: 06/05/22 Scr: 1.34 07/13/22 epic Age: 87 Weight: 46kg

## 2022-09-04 NOTE — Telephone Encounter (Signed)
Component Ref Range & Units 1 mo ago (07/13/22) 3 mo ago (05/14/22) 6 mo ago (03/07/22) 7 mo ago (02/05/22) 7 mo ago (01/25/22) 7 mo ago (01/16/22) 8 mo ago (12/19/21)  Potassium 3.5 - 5.1 mmol/L 4.2 3.6 4.4 4.3 R 3.5 R 5.1 R 3.0 Low

## 2022-09-06 MED FILL — Iron Sucrose Inj 20 MG/ML (Fe Equiv): INTRAVENOUS | Qty: 10 | Status: AC

## 2022-09-07 ENCOUNTER — Other Ambulatory Visit: Payer: Self-pay

## 2022-09-07 ENCOUNTER — Telehealth: Payer: Self-pay | Admitting: Cardiology

## 2022-09-07 NOTE — Telephone Encounter (Signed)
Pt c/o medication issue:  1. Name of Medication:   apixaban (ELIQUIS) 2.5 MG TABS tablet    2. How are you currently taking this medication (dosage and times per day)?   Take 1 tablet by mouth twice daily    3. Are you having a reaction (difficulty breathing--STAT)? No  4. What is your medication issue? Pt stated she tried to pick up her medication but pharmacy told her insurance was no longer paying for it and it'll now cost her $2,000. Pt is requesting a callback to discuss options due to her running low on medication now. Please advise

## 2022-09-10 ENCOUNTER — Inpatient Hospital Stay: Payer: Medicare PPO | Attending: Internal Medicine

## 2022-09-10 ENCOUNTER — Inpatient Hospital Stay: Payer: Medicare PPO

## 2022-09-10 ENCOUNTER — Other Ambulatory Visit (HOSPITAL_COMMUNITY): Payer: Self-pay

## 2022-09-10 ENCOUNTER — Telehealth: Payer: Self-pay

## 2022-09-10 ENCOUNTER — Inpatient Hospital Stay: Payer: Medicare PPO | Admitting: Internal Medicine

## 2022-09-10 ENCOUNTER — Encounter: Payer: Self-pay | Admitting: Internal Medicine

## 2022-09-10 VITALS — BP 170/55 | HR 71 | Temp 97.6°F | Resp 20 | Wt 105.8 lb

## 2022-09-10 DIAGNOSIS — Z85028 Personal history of other malignant neoplasm of stomach: Secondary | ICD-10-CM | POA: Insufficient documentation

## 2022-09-10 DIAGNOSIS — D519 Vitamin B12 deficiency anemia, unspecified: Secondary | ICD-10-CM | POA: Diagnosis not present

## 2022-09-10 DIAGNOSIS — Z8673 Personal history of transient ischemic attack (TIA), and cerebral infarction without residual deficits: Secondary | ICD-10-CM | POA: Diagnosis not present

## 2022-09-10 DIAGNOSIS — N184 Chronic kidney disease, stage 4 (severe): Secondary | ICD-10-CM | POA: Diagnosis not present

## 2022-09-10 DIAGNOSIS — J90 Pleural effusion, not elsewhere classified: Secondary | ICD-10-CM | POA: Insufficient documentation

## 2022-09-10 DIAGNOSIS — E1151 Type 2 diabetes mellitus with diabetic peripheral angiopathy without gangrene: Secondary | ICD-10-CM | POA: Insufficient documentation

## 2022-09-10 DIAGNOSIS — Z85828 Personal history of other malignant neoplasm of skin: Secondary | ICD-10-CM | POA: Insufficient documentation

## 2022-09-10 DIAGNOSIS — N1330 Unspecified hydronephrosis: Secondary | ICD-10-CM | POA: Insufficient documentation

## 2022-09-10 DIAGNOSIS — D649 Anemia, unspecified: Secondary | ICD-10-CM | POA: Diagnosis not present

## 2022-09-10 DIAGNOSIS — E785 Hyperlipidemia, unspecified: Secondary | ICD-10-CM | POA: Insufficient documentation

## 2022-09-10 DIAGNOSIS — I129 Hypertensive chronic kidney disease with stage 1 through stage 4 chronic kidney disease, or unspecified chronic kidney disease: Secondary | ICD-10-CM | POA: Diagnosis not present

## 2022-09-10 DIAGNOSIS — Z8719 Personal history of other diseases of the digestive system: Secondary | ICD-10-CM | POA: Diagnosis not present

## 2022-09-10 DIAGNOSIS — R569 Unspecified convulsions: Secondary | ICD-10-CM | POA: Insufficient documentation

## 2022-09-10 DIAGNOSIS — C3490 Malignant neoplasm of unspecified part of unspecified bronchus or lung: Secondary | ICD-10-CM

## 2022-09-10 DIAGNOSIS — I4891 Unspecified atrial fibrillation: Secondary | ICD-10-CM | POA: Diagnosis not present

## 2022-09-10 DIAGNOSIS — E876 Hypokalemia: Secondary | ICD-10-CM | POA: Insufficient documentation

## 2022-09-10 DIAGNOSIS — K219 Gastro-esophageal reflux disease without esophagitis: Secondary | ICD-10-CM | POA: Diagnosis not present

## 2022-09-10 DIAGNOSIS — I2510 Atherosclerotic heart disease of native coronary artery without angina pectoris: Secondary | ICD-10-CM | POA: Insufficient documentation

## 2022-09-10 DIAGNOSIS — C3431 Malignant neoplasm of lower lobe, right bronchus or lung: Secondary | ICD-10-CM | POA: Diagnosis present

## 2022-09-10 DIAGNOSIS — E1122 Type 2 diabetes mellitus with diabetic chronic kidney disease: Secondary | ICD-10-CM | POA: Insufficient documentation

## 2022-09-10 DIAGNOSIS — D693 Immune thrombocytopenic purpura: Secondary | ICD-10-CM | POA: Diagnosis not present

## 2022-09-10 DIAGNOSIS — Z87442 Personal history of urinary calculi: Secondary | ICD-10-CM | POA: Insufficient documentation

## 2022-09-10 DIAGNOSIS — Z7901 Long term (current) use of anticoagulants: Secondary | ICD-10-CM | POA: Diagnosis not present

## 2022-09-10 LAB — CMP (CANCER CENTER ONLY)
ALT: 17 U/L (ref 0–44)
AST: 21 U/L (ref 15–41)
Albumin: 3.7 g/dL (ref 3.5–5.0)
Alkaline Phosphatase: 89 U/L (ref 38–126)
Anion gap: 8 (ref 5–15)
BUN: 31 mg/dL — ABNORMAL HIGH (ref 8–23)
CO2: 24 mmol/L (ref 22–32)
Calcium: 8.6 mg/dL — ABNORMAL LOW (ref 8.9–10.3)
Chloride: 105 mmol/L (ref 98–111)
Creatinine: 1.35 mg/dL — ABNORMAL HIGH (ref 0.44–1.00)
GFR, Estimated: 39 mL/min — ABNORMAL LOW (ref >60)
Glucose, Bld: 76 mg/dL (ref 70–99)
Potassium: 3.8 mmol/L (ref 3.5–5.1)
Sodium: 137 mmol/L (ref 135–145)
Total Bilirubin: 0.4 mg/dL (ref 0.3–1.2)
Total Protein: 6.6 g/dL (ref 6.5–8.1)

## 2022-09-10 LAB — CBC WITH DIFFERENTIAL (CANCER CENTER ONLY)
Abs Immature Granulocytes: 0.08 10*3/uL — ABNORMAL HIGH (ref 0.00–0.07)
Basophils Absolute: 0 10*3/uL (ref 0.0–0.1)
Basophils Relative: 1 %
Eosinophils Absolute: 0.1 10*3/uL (ref 0.0–0.5)
Eosinophils Relative: 2 %
HCT: 31.6 % — ABNORMAL LOW (ref 36.0–46.0)
Hemoglobin: 10.5 g/dL — ABNORMAL LOW (ref 12.0–15.0)
Immature Granulocytes: 1 %
Lymphocytes Relative: 16 %
Lymphs Abs: 1 10*3/uL (ref 0.7–4.0)
MCH: 30.4 pg (ref 26.0–34.0)
MCHC: 33.2 g/dL (ref 30.0–36.0)
MCV: 91.6 fL (ref 80.0–100.0)
Monocytes Absolute: 0.5 10*3/uL (ref 0.1–1.0)
Monocytes Relative: 8 %
Neutro Abs: 4.6 10*3/uL (ref 1.7–7.7)
Neutrophils Relative %: 72 %
Platelet Count: 137 10*3/uL — ABNORMAL LOW (ref 150–400)
RBC: 3.45 MIL/uL — ABNORMAL LOW (ref 3.87–5.11)
RDW: 13.4 % (ref 11.5–15.5)
WBC Count: 6.3 10*3/uL (ref 4.0–10.5)
nRBC: 0 % (ref 0.0–0.2)

## 2022-09-10 LAB — IRON AND TIBC
Iron: 54 ug/dL (ref 28–170)
Saturation Ratios: 22 % (ref 10.4–31.8)
TIBC: 244 ug/dL — ABNORMAL LOW (ref 250–450)
UIBC: 190 ug/dL

## 2022-09-10 LAB — FERRITIN: Ferritin: 137 ng/mL (ref 11–307)

## 2022-09-10 LAB — LACTATE DEHYDROGENASE: LDH: 160 U/L (ref 98–192)

## 2022-09-10 NOTE — Assessment & Plan Note (Addendum)
#   Stage IV recurrent adenocarcinoma the lung; EGFR mutated. On osimertinib 80 mg [Feb 6th 2020]. JAN 31st, 2024- CT CAP- non-contrast- Right lower lobectomy. No definitive evidence of disease;  Slight interval decrease in size of large chronic appearing and largely sub pulmonic pleural effusion in the RIGHT chest. Small pulmonary nodule in the LEFT lower lobe is indistinct at 3-4 mm and is stable compared to previous imaging.  Will again repeat imaging in 2 months./Ordered today.    # currently on Osimertinib 80 mg/day; tolerating well. See above/ Continue current therapy-no obvious evidence of recurrent malignancy at this time. Thoracentesis negative any malignant cells.  stable  #Right pleural effusion-status postthoracentesis-pleural fluid: Transudative; cytology negative for malignancy.  Question CHF/fluid overload.  S/p Lasix 20 mg a day- improved/stable. Currently not on any lasix [ARF].  stable  # Worsening anemia- Hb 11.10 Dec 2021- Iron sat- 17%; ferritin- 44- continue Iron pills once day. Hold off Venofer. stable  # ITP- chronic- platelets> 100 on promacta 25 mg/day- stable  # Stomach cancer stage I status post gastrectomy; June 2022- S/p EGD- [facility].-stable  # A-fib [feb 2024] on Eliquis 2.5/cardiology.  Discussed regarding bleeding precautions; recommend speaking to cards- re: insurance issues.   # right sided stroke/ seizures-on asprin; off plavix;; f/u neurology-[place]-  stable  # CKD- stage IV; Left Hydronephrosis s/p ureteral stent placement/nephrolithiasis-~ GFR 34 today; Hypokalemia-3.4  continue K-citrate q day. stable  # IV ACCESS: PIV  # DISPOSITION: # no venofer today # follow up in 2 months- MD: labs- cbc/cmp;LDH; iron studies; ferritin- possible IV venofer; CT CAP-  Dr.B

## 2022-09-10 NOTE — Telephone Encounter (Signed)
Prior Berkley Harvey was submitted and approved, pharmacy should be able to process her Eliquis rx for normal copay now.

## 2022-09-10 NOTE — Progress Notes (Signed)
Patient has no concerns 

## 2022-09-10 NOTE — Progress Notes (Signed)
[facility] OFFICE PROGRESS NOTE  Patient Care Team: Lynnea Ferrier, MD as PCP - General (Internal Medicine) Earna Coder, MD as Consulting Physician (Internal Medicine) Stanton Kidney, MD as Consulting Physician (Gastroenterology) Lanier Prude, MD as Consulting Physician (Cardiology)   Cancer Staging  Primary malignant neoplasm of right lower lobe of lung [facility]) Staging form: Lung, AJCC 7th Edition - Clinical: No stage assigned - Unsigned    Oncology History Overview Note  # DEC 2017- Adeno ca [GATA; her 2 Neu-NEG]; signet ring [1.5 x3 mm gastric incisura; Dr.Skulskie]; EUS [Dr.Burnbridge]; no significant abnormality noted;  Reviewed at [facility] also. JAN 2018- PET NED. April 2018- S/p partial gastrectomy [Dr.Sankar]- STAGE I ADENO CA; NO adjuvant therapy.  # STAGE I CARCINOID s/p partial gastrectomy   # May 2018- Chronic Atrophic gastritis- Prevpac   # FEB 2017- ADENOCARCINOMA with Lepidic 80%-20% acinar pattern; pT2a [Stage IB;T-2.3cm; visceral pleural invasion present; pN=0 ]; AUG 2017- CT NED;   # DEC 2019- RECURRENT/STAGE IV ADENO LUNG CA- EGFR MUTATED;# Jan 6th 2020-; START Osidemrtinib;  # AUG 2020- SEVERE AS [s/p TAVR; [place]]-complicated by R sided stroke/ seizures/acute renal failure.   # DEC 2nd 2020- START PROMACTA 25 mg/day [ITP]  # Molecular testing: EFGR mutated [id number] [omniseq]  # Palliative: O-1/20   DIAGNOSIS:  #ADENO CA LUNG-STAGE IV #Stomach adeno ca [stage I; dec 2017]  GOALS: pallaitive  CURRENT/MOST RECENT THERAPY - OSIMERTINIB [Jan 6th 2020]      Primary malignant neoplasm of right lower lobe of lung (HCC)  12/02/2015 Initial Diagnosis   Primary malignant neoplasm of right lower lobe of lung (HCC)   Adenocarcinoma of gastric cardia (HCC)   INTERVAL HISTORY: Alone.  Ambulating independantly.   Stacy Cook 87 y.o.  female pleasant patient above history of recurrent/stage IV adenocarcinoma lung/EGFR mutated  currently on osimertinib; ITP on promacta is here for follow-up.  Patient in for follow up, reports she is doing well, feels appetite is improved even though weight is down. Pt states she has basal cell skin cancer removed in March by Dr Gwen Pounds.  Recently started on eliquis 2.5 mg BID by cardiology. No hemoptysis.  Denies any worsening swelling of the legs.  Patient states that she is compliant with her oral iron.  No constipation.  Review of Systems  Constitutional:  Positive for malaise/fatigue. Negative for chills, diaphoresis and fever.  HENT:  Negative for nosebleeds and sore throat.   Eyes:  Negative for double vision.  Respiratory:  Negative for hemoptysis, sputum production and wheezing.   Cardiovascular:  Negative for chest pain, palpitations, orthopnea and leg swelling.  Gastrointestinal:  Negative for blood in stool, constipation, melena, nausea and vomiting.  Genitourinary:  Negative for dysuria, frequency and urgency.  Musculoskeletal:  Positive for back pain and joint pain.  Neurological:  Positive for focal weakness. Negative for dizziness, tingling, weakness and headaches.  Endo/Heme/Allergies:  Bruises/bleeds easily.  Psychiatric/Behavioral:  Negative for depression. The patient is not nervous/anxious and does not have insomnia.       PAST MEDICAL HISTORY :  Past Medical History:  Diagnosis Date   Abdominal pain    Acid reflux 03/24/2015   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   Allergic genetic state    Anemia    Arteriosclerosis of coronary artery 03/24/2015   Arthritis    "back, hands" (10/15/2017)   Asthma    B12 deficiency anemia    Basal cell carcinoma 01/01/2022   glabella, ED[facility]  Basal cell carcinoma 05/03/2022   R ant deltoid, [facility]   Basal cell carcinoma 05/03/2022   Xyphoid excised 06/12/2022   Bile reflux gastritis    CAD (coronary artery disease)    Cancer of right lung (HCC) 05/09/2015   Dr. Thelma Barge performed Right lower lobe lobectomy.     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 08/03/2016   Carotid stenosis 02/07/2016   Cataract cortical, senile    Chest pain    Colon polyp    Degeneration of intervertebral disc of lumbar region 03/11/2014   Diabetes (HCC)    Gastric adenocarcinoma (HCC)    GERD (gastroesophageal reflux disease)    also, history of ulcers   History of kidney stones    History of stroke    HLD (hyperlipidemia) 08/24/2013   Hypertension    Malignant tumor of stomach (HCC) 07/2016   Adenocarcinoma, diffuse, poorly differentiated, signet ring, stage I   Neuritis or radiculitis due to rupture of lumbar intervertebral disc 09/10/2014   Neuroendocrine tumor 03/24/2015   Neuroma    Osteoporosis    Peripheral vascular disease (HCC)    PONV (postoperative nausea and vomiting)    Presence of permanent cardiac pacemaker 12/09/2018   Primary malignant neoplasm of right lower lobe of lung (HCC) 12/02/2015   Renal insufficiency    Renal stone    S/P TAVR (transcatheter aortic valve replacement)    Sciatica    Severe aortic stenosis    Skin cancer    "cut/burned LUE; cut off right eye/nose & cut off chest" (10/15/2017)   Stomach ulcer    Thrombocytopenia (HCC)    Type 2 diabetes, diet controlled (HCC)    "no RX since stomach OR 07/2016" (10/15/2017)    PAST SURGICAL HISTORY :   Past Surgical History:  Procedure Laterality Date   APPENDECTOMY     CARDIAC CATHETERIZATION  X2 before 10/15/2017   CARDIAC VALVE REPLACEMENT     CATARACT EXTRACTION W/PHACO Left 04/04/2017   Procedure: CATARACT EXTRACTION PHACO AND INTRAOCULAR LENS PLACEMENT (IOC);  Surgeon: Lockie Mola, MD;  Location: [facility] ORS;  Service: Ophthalmology;  Laterality: Left;  Lot # [id number] H [place] 1:00 Ap 25% CDE 8.54   CATARACT EXTRACTION W/PHACO Right 05/15/2017   Procedure: CATARACT EXTRACTION PHACO AND INTRAOCULAR LENS PLACEMENT (IOC);  Surgeon: Lockie Mola, MD;  Location: [facility];  Service: Ophthalmology;  Laterality: Right;  [place]  01:10 AP% 18.3 CDE 12.91 Fluid pack lot # [id number]   COLONOSCOPY     CORONARY ATHERECTOMY N/A 10/15/2017   Procedure: CORONARY ATHERECTOMY;  Surgeon: Kathleene Hazel, MD;  [facility];  Service: Cardiovascular;  Laterality: N/A;   CORONARY STENT INTERVENTION N/A 10/15/2017   Procedure: CORONARY STENT INTERVENTION;  Surgeon: Kathleene Hazel, MD;  Location: [facility];  Service: Cardiovascular;  Laterality: N/A;   CYSTOSCOPY/URETEROSCOPY/HOLMIUM LASER/STENT PLACEMENT Left 08/03/2019   Procedure: CYSTOSCOPY/URETEROSCOPY/HOLMIUM LASER/STENT PLACEMENT;  Surgeon: Vanna Scotland, MD;  Location: [facility] OR[facility];  Service: Urology;  Laterality: Left;   ESOPHAGOGASTRODUODENOSCOPY     ESOPHAGOGASTRODUODENOSCOPY (EGD) WITH PROPOFOL N/A 03/27/2016   Procedure: ESOPHAGOGASTRODUODENOSCOPY (EGD) WITH PROPOFOL;  Surgeon: Christena Deem, MD;  Location[facility] ENDOSCOPY;  Service: Endoscopy;  Laterality: N/A;   ESOPHAGOGASTRODUODENOSCOPY (EGD) WITH PROPOFOL N/A 05/28/2016   Procedure: ESOPHAGOGASTRODUODENOSCOPY (EGD) WITH PROPOFOL;  Surgeon: Christena Deem, MD;  Location: [facility] ENDOSCOPY;  Service: Endoscopy;  Laterality: N/A;   ESOPHAGOGASTRODUODENOSCOPY (EGD) WITH PROPOFOL N/A 09/25/2016   Procedure: ESOPHAGOGASTRODUODENOSCOPY (EGD) WITH PROPOFOL;  Surgeon: Kieth Brightly,  MD;  Location: [facility] ENDOSCOPY;  Service: Endoscopy;  Laterality: N/A;   ESOPHAGOGASTRODUODENOSCOPY (EGD) WITH PROPOFOL N/A 12/18/2016   Procedure: ESOPHAGOGASTRODUODENOSCOPY (EGD) WITH PROPOFOL;  Surgeon: Kieth Brightly, MD;  Location: [facility] ENDOSCOPY;  Service: Endoscopy;  Laterality: N/A;   ESOPHAGOGASTRODUODENOSCOPY (EGD) WITH PROPOFOL N/A 01/23/2017   Procedure: ESOPHAGOGASTRODUODENOSCOPY (EGD) WITH PROPOFOL;  Surgeon: Kieth Brightly, MD;  Location: [facility] ENDOSCOPY;  Service: Endoscopy;  Laterality: N/A;   ESOPHAGOGASTRODUODENOSCOPY (EGD) WITH PROPOFOL N/A 03/20/2017    Procedure: ESOPHAGOGASTRODUODENOSCOPY (EGD) WITH PROPOFOL;  Surgeon: Kieth Brightly, MD;  Location: [facility] ENDOSCOPY;  Service: Endoscopy;  Laterality: N/A;   ESOPHAGOGASTRODUODENOSCOPY (EGD) WITH PROPOFOL N/A 09/23/2020   Procedure: ESOPHAGOGASTRODUODENOSCOPY (EGD) WITH PROPOFOL;  Surgeon: Regis Bill, MD;  Location: [facility] ENDOSCOPY;  Service: Endoscopy;  Laterality: N/A;  [facility] SAYS "AMPICILLIN IS NEEDED"   FRACTURE SURGERY     INSERT / REPLACE / REMOVE PACEMAKER  12/09/2018   PACEMAKER LEADLESS INSERTION N/A 12/09/2018   Procedure: PACEMAKER LEADLESS INSERTION;  Surgeon: Hillis Range, MD;  [facility];  Service: Cardiovascular;  Laterality: N/A;   PARTIAL GASTRECTOMY N/A 08/03/2016   Hemigastrectomy, Billroth I reconstruction Surgeon: Kieth Brightly, MD;  Location: [facility] ORS;  Service: General;  Laterality: N/A;   RIGHT/LEFT HEART CATH AND CORONARY ANGIOGRAPHY Bilateral 09/19/2017   Procedure: RIGHT/LEFT HEART CATH AND CORONARY ANGIOGRAPHY;  Surgeon: Alwyn Pea, MD;  Location: [facility] CV [facility];  Service: Cardiovascular;  Laterality: Bilateral;   SHOULDER ARTHROSCOPY W/ CAPSULAR REPAIR Right    SKIN CANCER EXCISION     "cut/burned LUE; cut off right eye/nose & cut off chest" (10/15/2017)   TEE WITHOUT CARDIOVERSION N/A 12/02/2018   Procedure: TRANSESOPHAGEAL ECHOCARDIOGRAM (TEE);  Surgeon: Kathleene Hazel, MD;  [facility] INVASIVE CV LAB;  Service: Open Heart Surgery;  Laterality: N/A;   THORACOTOMY Right 05/09/2015   Procedure: THORACOTOMY, RIGHT LOWER LOBECTOMY, BRONCHOSCOPY;  Surgeon: Hulda Marin, MD;  Location: [facility] ORS;  Service: Thoracic;  Laterality: Right;   TONSILLECTOMY  1944   TRANSCATHETER AORTIC VALVE REPLACEMENT, TRANSFEMORAL N/A 12/02/2018   Procedure: TRANSCATHETER AORTIC VALVE REPLACEMENT, TRANSFEMORAL;  Surgeon: Kathleene Hazel, MD;  [facility];  Service: Open Heart Surgery;  Laterality: N/A;    TUBAL LIGATION     UPPER GI ENDOSCOPY N/A 08/03/2016   Procedure: UPPER  ENDOSCOPY;  Surgeon: Kieth Brightly, MD;  Location: [facility] ORS;  Service: General;  Laterality: N/A;   VAGINAL HYSTERECTOMY     WRIST FRACTURE SURGERY Right     FAMILY HISTORY :   Family History  Problem Relation Age of Onset   Aortic aneurysm Mother    Kidney Stones Mother    Hypertension Mother    Hypertension Father    Diabetes Other    Breast cancer Neg Hx    Prostate cancer Neg Hx    Bladder Cancer Neg Hx    Kidney cancer Neg Hx     SOCIAL HISTORY:   Social History   Tobacco Use   Smoking status: Never   Smokeless tobacco: Never  Vaping Use   Vaping Use: Never used  Substance Use Topics   Alcohol use: Not Currently   Drug use: Never    ALLERGIES:  is allergic to mirtazapine, pneumococcal vaccine, statins, and sulfa antibiotics.  MEDICATIONS:  Current Outpatient Medications  Medication Sig Dispense Refill   apixaban (ELIQUIS) 2.5 MG TABS tablet Take 1 tablet by mouth twice daily 180 tablet 0   cholecalciferol (VITAMIN D)  1000 UNITS tablet Take 1,000 Units by mouth daily.     cyanocobalamin 1000 MCG tablet Take 1,000 mcg by mouth daily.      eltrombopag (PROMACTA) 25 MG tablet Take 1 tablet (25 mg total) by mouth daily. Take on an empty stomach, 1 hour before a meal or 2 hours after. 30 tablet 7   ezetimibe (ZETIA) 10 MG tablet Take 10 mg by mouth daily.     ferrous sulfate 325 (65 FE) MG tablet Take 325 mg by mouth daily with breakfast.     isosorbide mononitrate (IMDUR) 30 MG 24 hr tablet Take 1 tablet by mouth once daily 90 tablet 2   metoprolol succinate (TOPROL-XL) 50 MG 24 hr tablet Take 1 tablet (50 mg total) by mouth every evening. Take with or immediately following a meal. 30 tablet 0   mupirocin ointment (BACTROBAN) 2 % Apply 1 Application topically daily. With dressing changes 22 g 0   osimertinib mesylate (TAGRISSO) 80 MG tablet Take 1 tablet (80 mg total) by mouth daily. 30  tablet 11   potassium citrate (UROCIT-K) 10 MEQ (1080 MG) SR tablet Take 1 tablet by mouth once daily 90 tablet 0   Magnesium 100 MG TABS Take by mouth. (Patient not taking: Reported on 07/13/2022)     No current facility-administered medications for this visit.    PHYSICAL EXAMINATION: ECOG PERFORMANCE STATUS: 0 - Asymptomatic  BP (!) 170/55   Pulse 71   Temp 97.6 F (36.4 C)   Resp 20   Wt 105 lb 12.8 oz (48 kg)   SpO2 100%   BMI 19.99 kg/m   Filed Weights   09/10/22 1424  Weight: 105 lb 12.8 oz (48 kg)   Decreased breath sounds on the right side compared to left.  Physical Exam Constitutional:      Comments: Patient walking by herself; no assistive devices.  Alone.  HENT:     Head: Normocephalic and atraumatic.     Mouth/Throat:     Pharynx: No oropharyngeal exudate.  Eyes:     Pupils: Pupils are equal, round, and reactive to light.  Cardiovascular:     Rate and Rhythm: Normal rate and regular rhythm.     Heart sounds: Murmur heard.  Pulmonary:     Effort: No respiratory distress.     Breath sounds: No wheezing.  Abdominal:     General: Bowel sounds are normal. There is no distension.     Palpations: Abdomen is soft. There is no mass.     Tenderness: There is no abdominal tenderness. There is no guarding or rebound.  Musculoskeletal:        General: No tenderness. Normal range of motion.     Cervical back: Normal range of motion and neck supple.  Skin:    General: Skin is warm.  Neurological:     Mental Status: She is alert and oriented to person, place, and time.     Comments: Weakness of the right upper extremity more than lower extremity.  Psychiatric:        Mood and Affect: Affect normal.    LABORATORY DATA:  I have reviewed the data as listed    Component Value Date/Time   NA 137 09/10/2022 1415   NA 141 02/05/2022 1405   NA 141 07/21/2012 1529   K 3.8 09/10/2022 1415   K 3.9 07/21/2012 1529   CL 105 09/10/2022 1415   CL 109 (H) 07/21/2012  1529   CO2 24 09/10/2022 1415  CO2 27 07/21/2012 1529   GLUCOSE 76 09/10/2022 1415   GLUCOSE 104 (H) 07/21/2012 1529   BUN 31 (H) 09/10/2022 1415   BUN 34 (H) 02/05/2022 1405   BUN 16 07/21/2012 1529   CREATININE 1.35 (H) 09/10/2022 1415   CREATININE 1.11 09/03/2012 1535   CALCIUM 8.6 (L) 09/10/2022 1415   CALCIUM 8.9 07/21/2012 1529   PROT 6.6 09/10/2022 1415   PROT 7.1 07/21/2012 1529   ALBUMIN 3.7 09/10/2022 1415   ALBUMIN 3.9 07/21/2012 1529   AST 21 09/10/2022 1415   ALT 17 09/10/2022 1415   ALT 22 07/21/2012 1529   ALKPHOS 89 09/10/2022 1415   ALKPHOS 76 07/21/2012 1529   BILITOT 0.4 09/10/2022 1415   GFRNONAA 39 (L) 09/10/2022 1415   GFRNONAA 49 (L) 09/03/2012 1535   GFRAA 29 (L) 01/05/2020 1440   GFRAA 57 (L) 09/03/2012 1535    No results found for: "SPEP", "UPEP"  Lab Results  Component Value Date   WBC 6.3 09/10/2022   NEUTROABS 4.6 09/10/2022   HGB 10.5 (L) 09/10/2022   HCT 31.6 (L) 09/10/2022   MCV 91.6 09/10/2022   PLT 137 (L) 09/10/2022      Chemistry      Component Value Date/Time   NA 137 09/10/2022 1415   NA 141 02/05/2022 1405   NA 141 07/21/2012 1529   K 3.8 09/10/2022 1415   K 3.9 07/21/2012 1529   CL 105 09/10/2022 1415   CL 109 (H) 07/21/2012 1529   CO2 24 09/10/2022 1415   CO2 27 07/21/2012 1529   BUN 31 (H) 09/10/2022 1415   BUN 34 (H) 02/05/2022 1405   BUN 16 07/21/2012 1529   CREATININE 1.35 (H) 09/10/2022 1415   CREATININE 1.11 09/03/2012 1535      Component Value Date/Time   CALCIUM 8.6 (L) 09/10/2022 1415   CALCIUM 8.9 07/21/2012 1529   ALKPHOS 89 09/10/2022 1415   ALKPHOS 76 07/21/2012 1529   AST 21 09/10/2022 1415   ALT 17 09/10/2022 1415   ALT 22 07/21/2012 1529   BILITOT 0.4 09/10/2022 1415       RADIOGRAPHIC STUDIES: I have personally reviewed the radiological images as listed and agreed with the findings in the report. No results found.   ASSESSMENT & PLAN:  Primary malignant neoplasm of right lower lobe  of lung (HCC) # Stage IV recurrent adenocarcinoma the lung; EGFR mutated. On osimertinib 80 mg [Feb 6th 2020]. JAN 31st, 2024- CT CAP- non-contrast- Right lower lobectomy. No definitive evidence of disease;  Slight interval decrease in size of large chronic appearing and largely sub pulmonic pleural effusion in the RIGHT chest. Small pulmonary nodule in the LEFT lower lobe is indistinct at 3-4 mm and is stable compared to previous imaging.  Will again repeat imaging in 2 months./Ordered today.    # currently on Osimertinib 80 mg/day; tolerating well. See above/ Continue current therapy-no obvious evidence of recurrent malignancy at this time. Thoracentesis negative any malignant cells.  stable  #Right pleural effusion-status postthoracentesis-pleural fluid: Transudative; cytology negative for malignancy.  Question CHF/fluid overload.  S/p Lasix 20 mg a day- improved/stable. Currently not on any lasix [ARF].  stable  # Worsening anemia- Hb 11.10 Dec 2021- Iron sat- 17%; ferritin- 44- continue Iron pills once day. Hold off Venofer. stable  # ITP- chronic- platelets> 100 on promacta 25 mg/day- stable  # Stomach cancer stage I status post gastrectomy; June 2022- S/p EGD- [facility].-stable  # A-fib [feb 2024] on Eliquis  2.5/cardiology.  Discussed regarding bleeding precautions; recommend speaking to cards- re: insurance issues.   # right sided stroke/ seizures-on asprin; off plavix;; f/u neurology-[place]-  stable  # CKD- stage IV; Left Hydronephrosis s/p ureteral stent placement/nephrolithiasis-~ GFR 34 today; Hypokalemia-3.4  continue K-citrate q day. stable  # IV ACCESS: PIV  # DISPOSITION: # no venofer today # follow up in 2 months- MD: labs- cbc/cmp;LDH; iron studies; ferritin- possible IV venofer; CT CAP-  Dr.B      Orders Placed This Encounter  Procedures   CT ABDOMEN PELVIS WO CONTRAST    Standing Status:   Future    Standing Expiration Date:   09/10/2023    Order Specific Question:    Preferred imaging location?    Answer:   [facility]    Order Specific Question:   If indicated for the ordered procedure, I authorize the administration of oral contrast media per Radiology protocol    Answer:   Yes    Order Specific Question:   Does the patient have a contrast media/X-ray dye allergy?    Answer:   No   CT CHEST WO CONTRAST    Standing Status:   Future    Standing Expiration Date:   09/10/2023    Order Specific Question:   Preferred imaging location?    Answer:   [facility]    Order Specific Question:   Radiology Contrast Protocol - do NOT remove file path    Answer:   [email or web address]   All questions were answered. The patient knows to call the clinic with any problems, questions or concerns.      Earna Coder, MD 09/10/2022 2:53 PM

## 2022-09-10 NOTE — Telephone Encounter (Signed)
Pharmacy Patient Advocate Encounter  Prior Authorization for ELIQUIS 2.5 has been APPROVED by [facility] from 1.1.24 to 12.31.24.   KEY# [id number]

## 2022-09-11 ENCOUNTER — Other Ambulatory Visit: Payer: Self-pay | Admitting: Nurse Practitioner

## 2022-09-11 DIAGNOSIS — I480 Paroxysmal atrial fibrillation: Secondary | ICD-10-CM

## 2022-09-11 NOTE — Telephone Encounter (Signed)
Eliquis 2.5mg  refill request received. Patient is 87 years old, weight-48kg, Crea-1.35 on 09/10/22, Diagnosis-Afib, and last seen by Francis Dowse on 06/05/22. Dose is appropriate based on dosing criteria. Will send in refill to requested pharmacy.

## 2022-09-28 ENCOUNTER — Encounter: Payer: Self-pay | Admitting: Cardiology

## 2022-09-28 ENCOUNTER — Ambulatory Visit: Payer: Medicare PPO | Attending: Cardiology | Admitting: Cardiology

## 2022-09-28 VITALS — BP 154/68 | HR 67 | Ht 61.0 in | Wt 107.6 lb

## 2022-09-28 DIAGNOSIS — I480 Paroxysmal atrial fibrillation: Secondary | ICD-10-CM

## 2022-09-28 DIAGNOSIS — Z950 Presence of cardiac pacemaker: Secondary | ICD-10-CM

## 2022-09-28 NOTE — Progress Notes (Unsigned)
  Electrophysiology Office Follow up Visit Note:    Date:  09/29/2022   ID:  Stacy Cook, DOB 07/24/1935, MRN [id number]  PCP:  Lynnea Ferrier, MD  [facility] Cardiologist:  None  [facility] Electrophysiologist:  None    Interval History:    Stacy Cook is a 87 y.o. female who presents for a follow up visit.   Last seen 06/05/2022 by Stacy Cook. I have not previously met the patient as she was seen by Dr Johney Frame.  She has a history of VHD s/p TAVR, CHB s/p PPM with leadless PPM, lung cancer, CKD3, DM, HTN, stroke and HFpEF. Today she reports doing OK, no significant changes in her cardiac status. No syncope or presyncope.       Past medical, surgical, social and family history were reviewed.  ROS:   Please see the history of present illness.    All other systems reviewed and are negative.  EKGs/Labs/Other Studies Reviewed:    The following studies were reviewed today:  09/28/2022 in clinic device interrogation personally reviewed Longevity 5.6 years Lead parameters stable. AM-VP 78.6% Programmed VDD 60-115 bpm.   EKG Interpretation  Date/Time:  Friday September 28 2022 15:23:51 EDT Ventricular Rate:  68 PR Interval:  154 QRS Duration: 160 QT Interval:  494 QTC Calculation: 525 R Axis:   -74 Text Interpretation: Atrial-sensed ventricular-paced rhythm with occasional Premature ventricular complexes Confirmed by Steffanie Dunn [phone]) on 09/28/2022 3:47:06 PM    Physical Exam:    VS:  BP (!) 154/68   Pulse 67   Ht 5\' 1"  (1.549 m)   Wt 107 lb 9.6 oz (48.8 kg)   SpO2 98%   BMI 20.33 kg/m     Wt Readings from Last 3 Encounters:  09/28/22 107 lb 9.6 oz (48.8 kg)  09/10/22 105 lb 12.8 oz (48 kg)  07/13/22 103 lb (46.7 kg)     GEN:  Well nourished, well developed in no acute distress CARDIAC: RRR, no murmurs, rubs, gallops RESPIRATORY:  Clear to auscultation without rales, wheezing or rhonchi       ASSESSMENT:    1. Cardiac pacemaker in situ    2. Paroxysmal atrial fibrillation (HCC)    PLAN:    In order of problems listed above:    #CHB #PPM in situ Device functioning appropriately. Continue remote monitoring.  #pAF Low burden On eliquis  #VHD Seems compensated today on exam.   Follow up 1 year with APP.      Signed, Steffanie Dunn, MD, [facility], [facility] 09/29/2022 9:44 AM    Electrophysiology [facility]

## 2022-09-28 NOTE — Patient Instructions (Signed)
Medication Instructions:  Your physician recommends that you continue on your current medications as directed. Please refer to the Current Medication list given to you today.  *If you need a refill on your cardiac medications before your next appointment, please call your pharmacy*  Follow-Up: At [facility], you and your health needs are our priority.  As part of our continuing mission to provide you with exceptional heart care, we have created designated Provider Care Teams.  These Care Teams include your primary Cardiologist (physician) and Advanced Practice Providers (APPs -  Physician Assistants and Nurse Practitioners) who all work together to provide you with the care you need, when you need it.    Your next appointment:   1 year(s)  Provider:   Casimiro Needle "Otilio Saber, PA-C

## 2022-10-05 ENCOUNTER — Ambulatory Visit (INDEPENDENT_AMBULATORY_CARE_PROVIDER_SITE_OTHER): Payer: Medicare PPO

## 2022-10-05 DIAGNOSIS — I459 Conduction disorder, unspecified: Secondary | ICD-10-CM

## 2022-10-05 LAB — CUP PACEART REMOTE DEVICE CHECK
Battery Remaining Longevity: 66 mo
Battery Voltage: 2.96 V
Brady Statistic AS VP Percent: 82.11 %
Brady Statistic AS VS Percent: 0 %
Brady Statistic RV Percent Paced: 99.4 %
Date Time Interrogation Session: 20240628112900
Implantable Pulse Generator Implant Date: 20200901
Lead Channel Impedance Value: 500 Ohm
Lead Channel Pacing Threshold Amplitude: 0.75 V
Lead Channel Pacing Threshold Pulse Width: 0.24 ms
Lead Channel Sensing Intrinsic Amplitude: 18.788 mV
Lead Channel Setting Pacing Amplitude: 1.375
Lead Channel Setting Pacing Pulse Width: 0.24 ms
Lead Channel Setting Sensing Sensitivity: 2.8 mV

## 2022-10-18 ENCOUNTER — Encounter: Payer: Self-pay | Admitting: Dermatology

## 2022-10-18 ENCOUNTER — Ambulatory Visit: Payer: Medicare PPO | Admitting: Dermatology

## 2022-10-18 VITALS — BP 139/64 | HR 98

## 2022-10-18 DIAGNOSIS — D229 Melanocytic nevi, unspecified: Secondary | ICD-10-CM

## 2022-10-18 DIAGNOSIS — L814 Other melanin hyperpigmentation: Secondary | ICD-10-CM | POA: Diagnosis not present

## 2022-10-18 DIAGNOSIS — L821 Other seborrheic keratosis: Secondary | ICD-10-CM | POA: Diagnosis not present

## 2022-10-18 DIAGNOSIS — L578 Other skin changes due to chronic exposure to nonionizing radiation: Secondary | ICD-10-CM

## 2022-10-18 DIAGNOSIS — D1801 Hemangioma of skin and subcutaneous tissue: Secondary | ICD-10-CM | POA: Diagnosis not present

## 2022-10-18 DIAGNOSIS — W908XXA Exposure to other nonionizing radiation, initial encounter: Secondary | ICD-10-CM

## 2022-10-18 DIAGNOSIS — L820 Inflamed seborrheic keratosis: Secondary | ICD-10-CM

## 2022-10-18 DIAGNOSIS — D692 Other nonthrombocytopenic purpura: Secondary | ICD-10-CM

## 2022-10-18 DIAGNOSIS — Z1283 Encounter for screening for malignant neoplasm of skin: Secondary | ICD-10-CM

## 2022-10-18 DIAGNOSIS — Z85828 Personal history of other malignant neoplasm of skin: Secondary | ICD-10-CM

## 2022-10-18 NOTE — Progress Notes (Signed)
Remote pacemaker transmission.   

## 2022-10-18 NOTE — Progress Notes (Signed)
Follow-Up Visit   Subjective  Stacy Cook is a 87 y.o. female who presents for the following: Hx of BCCs, sun exposed skin exam. Areas of concern. The patient has spots, moles and lesions to be evaluated, some may be new or changing and the patient may have concern these could be cancer.  The following portions of the chart were reviewed this encounter and updated as appropriate: medications, allergies, medical history  Review of Systems:  No other skin or systemic complaints except as noted in HPI or Assessment and Plan.  Objective  Well appearing patient in no apparent distress; mood and affect are within normal limits.  A focused examination was performed of the following areas: Face, neck, ears, scalp, arms, hands, chest  Relevant exam findings are noted in the Assessment and Plan.  right anterior vertex scalp x1 1.5 cm brown flat waxy papule        Assessment & Plan   HISTORY OF BASAL CELL CARCINOMA OF THE SKIN. Glabella, ED[facility] 01/01/2022; R ant deltoid, [facility] 05/03/2022; Xyphoid excised 06/12/2022. - No evidence of recurrence today - Recommend regular full body skin exams - Recommend daily broad spectrum sunscreen SPF 30+ to sun-exposed areas, reapply every 2 hours as needed.  - Call if any new or changing lesions are noted between office visits  Purpura - Chronic; persistent and recurrent.  Treatable, but not curable. - Violaceous macules and patches - Benign - Related to trauma, age, sun damage and/or use of blood thinners, chronic use of topical and/or oral steroids - Observe - Can use OTC arnica containing moisturizer such as Dermend Bruise Formula if desired - Call for worsening or other concerns  LENTIGINES, SEBORRHEIC KERATOSES, HEMANGIOMAS - Benign normal skin lesions - Benign-appearing - Call for any changes  MELANOCYTIC NEVI - Tan-brown and/or pink-flesh-colored symmetric macules and papules - Benign appearing on exam today - Observation - Call clinic  for new or changing moles - Recommend daily use of broad spectrum spf 30+ sunscreen to sun-exposed areas.   ACTINIC DAMAGE - Chronic condition, secondary to cumulative UV/sun exposure - diffuse scaly erythematous macules with underlying dyspigmentation - Recommend daily broad spectrum sunscreen SPF 30+ to sun-exposed areas, reapply every 2 hours as needed.  - Staying in the shade or wearing long sleeves, sun glasses (UVA+UVB protection) and wide brim hats (4-inch brim around the entire circumference of the hat) are also recommended for sun protection.  - Call for new or changing lesions.  SKIN CANCER SCREENING PERFORMED TODAY   Inflamed seborrheic keratosis right anterior vertex scalp x1  Symptomatic, irritating, patient would like treated.   Recheck in 4 months. Bx if not resolved.   Destruction of lesion - right anterior vertex scalp x1 Complexity: simple   Destruction method: cryotherapy   Informed consent: discussed and consent obtained   Timeout:  patient name, date of birth, surgical site, and procedure verified Lesion destroyed using liquid nitrogen: Yes   Region frozen until ice ball extended beyond lesion: Yes   Outcome: patient tolerated procedure well with no complications   Post-procedure details: wound care instructions given   Additional details:  Prior to procedure, discussed risks of blister formation, small wound, skin dyspigmentation, or rare scar following cryotherapy. Recommend Vaseline ointment to treated areas while healing.     Return in about 4 months (around 02/18/2023) for ISK Follow Up.  I, Lawson Radar, CMA, am acting as scribe for Armida Sans, MD.  Documentation: I have reviewed the above documentation for accuracy and  completeness, and I agree with the above.  Armida Sans, MD

## 2022-10-18 NOTE — Patient Instructions (Signed)
Cryotherapy Aftercare  Wash gently with soap and water everyday.   Apply Vaseline daily until healed.    Recommend daily broad spectrum sunscreen SPF 30+ to sun-exposed areas, reapply every 2 hours as needed. Call for new or changing lesions.  Staying in the shade or wearing long sleeves, sun glasses (UVA+UVB protection) and wide brim hats (4-inch brim around the entire circumference of the hat) are also recommended for sun protection.     Melanoma ABCDEs  Melanoma is the most dangerous type of skin cancer, and is the leading cause of death from skin disease.  You are more likely to develop melanoma if you: Have light-colored skin, light-colored eyes, or red or blond hair Spend a lot of time in the sun Tan regularly, either outdoors or in a tanning bed Have had blistering sunburns, especially during childhood Have a close family member who has had a melanoma Have atypical moles or large birthmarks  Early detection of melanoma is key since treatment is typically straightforward and cure rates are extremely high if we catch it early.   The first sign of melanoma is often a change in a mole or a new dark spot.  The ABCDE system is a way of remembering the signs of melanoma.  A for asymmetry:  The two halves do not match. B for border:  The edges of the growth are irregular. C for color:  A mixture of colors are present instead of an even brown color. D for diameter:  Melanomas are usually (but not always) greater than 6mm - the size of a pencil eraser. E for evolution:  The spot keeps changing in size, shape, and color.  Please check your skin once per month between visits. You can use a small mirror in front and a large mirror behind you to keep an eye on the back side or your body.   If you see any new or changing lesions before your next follow-up, please call to schedule a visit.  Please continue daily skin protection including broad spectrum sunscreen SPF 30+ to sun-exposed  areas, reapplying every 2 hours as needed when you're outdoors.   Staying in the shade or wearing long sleeves, sun glasses (UVA+UVB protection) and wide brim hats (4-inch brim around the entire circumference of the hat) are also recommended for sun protection.     Due to recent changes in healthcare laws, you may see results of your pathology and/or laboratory studies on MyChart before the doctors have had a chance to review them. We understand that in some cases there may be results that are confusing or concerning to you. Please understand that not all results are received at the same time and often the doctors may need to interpret multiple results in order to provide you with the best plan of care or course of treatment. Therefore, we ask that you please give us 2 business days to thoroughly review all your results before contacting the office for clarification. Should we see a critical lab result, you will be contacted sooner.   If You Need Anything After Your Visit  If you have any questions or concerns for your doctor, please call our main line at [phone] and press option 4 to reach your doctor's medical assistant. If no one answers, please leave a voicemail as directed and we will return your call as soon as possible. Messages left after 4 pm will be answered the following business day.   You may also send us a message via   MyChart. We typically respond to MyChart messages within 1-2 business days.  For prescription refills, please ask your pharmacy to contact our office. Our fax number is [phone].  If you have an urgent issue when the clinic is closed that cannot wait until the next business day, you can page your doctor at the number below.    Please note that while we do our best to be available for urgent issues outside of office hours, we are not available 24/7.   If you have an urgent issue and are unable to reach us, you may choose to seek medical care at your doctor's  office, retail clinic, urgent care center, or emergency room.  If you have a medical emergency, please immediately call 911 or go to the emergency department.  Pager Numbers  - Dr. Kowalski: [phone]  - Dr. Moye: [phone]  - Dr. Stewart: [phone]  In the event of inclement weather, please call our main line at [phone] for an update on the status of any delays or closures.  Dermatology Medication Tips: Please keep the boxes that topical medications come in in order to help keep track of the instructions about where and how to use these. Pharmacies typically print the medication instructions only on the boxes and not directly on the medication tubes.   If your medication is too expensive, please contact our office at [phone] option 4 or send us a message through MyChart.   We are unable to tell what your co-pay for medications will be in advance as this is different depending on your insurance coverage. However, we may be able to find a substitute medication at lower cost or fill out paperwork to get insurance to cover a needed medication.   If a prior authorization is required to get your medication covered by your insurance company, please allow us 1-2 business days to complete this process.  Drug prices often vary depending on where the prescription is filled and some pharmacies may offer cheaper prices.  The website www.goodrx.com contains coupons for medications through different pharmacies. The prices here do not account for what the cost may be with help from insurance (it may be cheaper with your insurance), but the website can give you the price if you did not use any insurance.  - You can print the associated coupon and take it with your prescription to the pharmacy.  - You may also stop by our office during regular business hours and pick up a GoodRx coupon card.  - If you need your prescription sent electronically to a different pharmacy, notify our office  through [facility] MyChart or by phone at [phone] option 4.     Si Usted Necesita Algo Despus de Su Visita  Tambin puede enviarnos un mensaje a travs de MyChart. Por lo general respondemos a los mensajes de MyChart en el transcurso de 1 a 2 das hbiles.  Para renovar recetas, por favor pida a su farmacia que se ponga en contacto con nuestra oficina. Nuestro nmero de fax es el [phone].  Si tiene un asunto urgente cuando la clnica est cerrada y que no puede esperar hasta el siguiente da hbil, puede llamar/localizar a su doctor(a) al nmero que aparece a continuacin.   Por favor, tenga en cuenta que aunque hacemos todo lo posible para estar disponibles para asuntos urgentes fuera del horario de oficina, no estamos disponibles las 24 horas del da, los 7 das de la semana.   Si tiene un problema urgente y no   puede comunicarse con nosotros, puede optar por buscar atencin mdica  en el consultorio de su doctor(a), en una clnica privada, en un centro de atencin urgente o en una sala de emergencias.  Si tiene una emergencia mdica, por favor llame inmediatamente al 911 o vaya a la sala de emergencias.  Nmeros de bper  - Dr. Kowalski: [phone]  - Dra. Moye: [phone]  - Dra. Stewart: [phone]  En caso de inclemencias del tiempo, por favor llame a nuestra lnea principal al [phone] para una actualizacin sobre el estado de cualquier retraso o cierre.  Consejos para la medicacin en dermatologa: Por favor, guarde las cajas en las que vienen los medicamentos de uso tpico para ayudarle a seguir las instrucciones sobre dnde y cmo usarlos. Las farmacias generalmente imprimen las instrucciones del medicamento slo en las cajas y no directamente en los tubos del medicamento.   Si su medicamento es muy caro, por favor, pngase en contacto con nuestra oficina llamando al [phone] y presione la opcin 4 o envenos un mensaje a travs de MyChart.   No  podemos decirle cul ser su copago por los medicamentos por adelantado ya que esto es diferente dependiendo de la cobertura de su seguro. Sin embargo, es posible que podamos encontrar un medicamento sustituto a menor costo o llenar un formulario para que el seguro cubra el medicamento que se considera necesario.   Si se requiere una autorizacin previa para que su compaa de seguros cubra su medicamento, por favor permtanos de 1 a 2 das hbiles para completar este proceso.  Los precios de los medicamentos varan con frecuencia dependiendo del lugar de dnde se surte la receta y alguna farmacias pueden ofrecer precios ms baratos.  El sitio web www.goodrx.com tiene cupones para medicamentos de diferentes farmacias. Los precios aqu no tienen en cuenta lo que podra costar con la ayuda del seguro (puede ser ms barato con su seguro), pero el sitio web puede darle el precio si no utiliz ningn seguro.  - Puede imprimir el cupn correspondiente y llevarlo con su receta a la farmacia.  - Tambin puede pasar por nuestra oficina durante el horario de atencin regular y recoger una tarjeta de cupones de GoodRx.  - Si necesita que su receta se enve electrnicamente a una farmacia diferente, informe a nuestra oficina a travs de MyChart de [facility] o por telfono llamando al [phone] y presione la opcin 4.    

## 2022-10-19 ENCOUNTER — Encounter: Payer: Self-pay | Admitting: Dermatology

## 2022-11-09 ENCOUNTER — Ambulatory Visit
Admission: RE | Admit: 2022-11-09 | Discharge: 2022-11-09 | Disposition: A | Discharge status: Home / Self Care | Payer: Medicare PPO | Source: Ambulatory Visit | Attending: Internal Medicine | Admitting: Internal Medicine

## 2022-11-09 DIAGNOSIS — C3490 Malignant neoplasm of unspecified part of unspecified bronchus or lung: Secondary | ICD-10-CM | POA: Insufficient documentation

## 2022-11-09 DIAGNOSIS — C3431 Malignant neoplasm of lower lobe, right bronchus or lung: Secondary | ICD-10-CM | POA: Diagnosis present

## 2022-11-15 MED FILL — Iron Sucrose Inj 20 MG/ML (Fe Equiv): INTRAVENOUS | Qty: 10 | Status: AC

## 2022-11-16 ENCOUNTER — Encounter: Payer: Self-pay | Admitting: Internal Medicine

## 2022-11-16 ENCOUNTER — Inpatient Hospital Stay: Payer: Medicare PPO | Attending: Internal Medicine

## 2022-11-16 ENCOUNTER — Inpatient Hospital Stay: Payer: Medicare PPO

## 2022-11-16 ENCOUNTER — Inpatient Hospital Stay (HOSPITAL_BASED_OUTPATIENT_CLINIC_OR_DEPARTMENT_OTHER): Payer: Medicare PPO | Admitting: Internal Medicine

## 2022-11-16 VITALS — BP 106/47 | HR 83 | Temp 97.8°F | Ht 61.0 in | Wt 108.6 lb

## 2022-11-16 DIAGNOSIS — D693 Immune thrombocytopenic purpura: Secondary | ICD-10-CM | POA: Insufficient documentation

## 2022-11-16 DIAGNOSIS — C160 Malignant neoplasm of cardia: Secondary | ICD-10-CM | POA: Diagnosis not present

## 2022-11-16 DIAGNOSIS — Z7901 Long term (current) use of anticoagulants: Secondary | ICD-10-CM | POA: Insufficient documentation

## 2022-11-16 DIAGNOSIS — Z85828 Personal history of other malignant neoplasm of skin: Secondary | ICD-10-CM | POA: Insufficient documentation

## 2022-11-16 DIAGNOSIS — E1122 Type 2 diabetes mellitus with diabetic chronic kidney disease: Secondary | ICD-10-CM | POA: Insufficient documentation

## 2022-11-16 DIAGNOSIS — Z85028 Personal history of other malignant neoplasm of stomach: Secondary | ICD-10-CM | POA: Insufficient documentation

## 2022-11-16 DIAGNOSIS — K219 Gastro-esophageal reflux disease without esophagitis: Secondary | ICD-10-CM | POA: Insufficient documentation

## 2022-11-16 DIAGNOSIS — I2510 Atherosclerotic heart disease of native coronary artery without angina pectoris: Secondary | ICD-10-CM | POA: Insufficient documentation

## 2022-11-16 DIAGNOSIS — Z8719 Personal history of other diseases of the digestive system: Secondary | ICD-10-CM | POA: Insufficient documentation

## 2022-11-16 DIAGNOSIS — I4891 Unspecified atrial fibrillation: Secondary | ICD-10-CM | POA: Insufficient documentation

## 2022-11-16 DIAGNOSIS — C3431 Malignant neoplasm of lower lobe, right bronchus or lung: Secondary | ICD-10-CM

## 2022-11-16 DIAGNOSIS — K921 Melena: Secondary | ICD-10-CM | POA: Diagnosis not present

## 2022-11-16 DIAGNOSIS — I129 Hypertensive chronic kidney disease with stage 1 through stage 4 chronic kidney disease, or unspecified chronic kidney disease: Secondary | ICD-10-CM | POA: Diagnosis not present

## 2022-11-16 DIAGNOSIS — E538 Deficiency of other specified B group vitamins: Secondary | ICD-10-CM | POA: Insufficient documentation

## 2022-11-16 DIAGNOSIS — Z87442 Personal history of urinary calculi: Secondary | ICD-10-CM | POA: Insufficient documentation

## 2022-11-16 DIAGNOSIS — J90 Pleural effusion, not elsewhere classified: Secondary | ICD-10-CM | POA: Insufficient documentation

## 2022-11-16 DIAGNOSIS — E1151 Type 2 diabetes mellitus with diabetic peripheral angiopathy without gangrene: Secondary | ICD-10-CM | POA: Insufficient documentation

## 2022-11-16 DIAGNOSIS — D649 Anemia, unspecified: Secondary | ICD-10-CM | POA: Insufficient documentation

## 2022-11-16 DIAGNOSIS — E785 Hyperlipidemia, unspecified: Secondary | ICD-10-CM | POA: Insufficient documentation

## 2022-11-16 LAB — CBC WITH DIFFERENTIAL (CANCER CENTER ONLY)
Abs Immature Granulocytes: 0.02 10*3/uL (ref 0.00–0.07)
Basophils Absolute: 0 10*3/uL (ref 0.0–0.1)
Basophils Relative: 0 %
Eosinophils Absolute: 0.1 10*3/uL (ref 0.0–0.5)
Eosinophils Relative: 2 %
HCT: 31.5 % — ABNORMAL LOW (ref 36.0–46.0)
Hemoglobin: 10.6 g/dL — ABNORMAL LOW (ref 12.0–15.0)
Immature Granulocytes: 0 %
Lymphocytes Relative: 18 %
Lymphs Abs: 1 10*3/uL (ref 0.7–4.0)
MCH: 31.3 pg (ref 26.0–34.0)
MCHC: 33.7 g/dL (ref 30.0–36.0)
MCV: 92.9 fL (ref 80.0–100.0)
Monocytes Absolute: 0.4 10*3/uL (ref 0.1–1.0)
Monocytes Relative: 7 %
Neutro Abs: 3.8 10*3/uL (ref 1.7–7.7)
Neutrophils Relative %: 73 %
Platelet Count: 110 10*3/uL — ABNORMAL LOW (ref 150–400)
RBC: 3.39 MIL/uL — ABNORMAL LOW (ref 3.87–5.11)
RDW: 12.6 % (ref 11.5–15.5)
WBC Count: 5.3 10*3/uL (ref 4.0–10.5)
nRBC: 0 % (ref 0.0–0.2)

## 2022-11-16 LAB — CMP (CANCER CENTER ONLY)
ALT: 15 U/L (ref 0–44)
AST: 20 U/L (ref 15–41)
Albumin: 3.8 g/dL (ref 3.5–5.0)
Alkaline Phosphatase: 91 U/L (ref 38–126)
Anion gap: 8 (ref 5–15)
BUN: 29 mg/dL — ABNORMAL HIGH (ref 8–23)
CO2: 23 mmol/L (ref 22–32)
Calcium: 8.8 mg/dL — ABNORMAL LOW (ref 8.9–10.3)
Chloride: 106 mmol/L (ref 98–111)
Creatinine: 1.38 mg/dL — ABNORMAL HIGH (ref 0.44–1.00)
GFR, Estimated: 38 mL/min — ABNORMAL LOW (ref >60)
Glucose, Bld: 105 mg/dL — ABNORMAL HIGH (ref 70–99)
Potassium: 3.9 mmol/L (ref 3.5–5.1)
Sodium: 137 mmol/L (ref 135–145)
Total Bilirubin: 0.5 mg/dL (ref 0.3–1.2)
Total Protein: 6.4 g/dL — ABNORMAL LOW (ref 6.5–8.1)

## 2022-11-16 LAB — IRON AND TIBC
Iron: 72 ug/dL (ref 28–170)
Saturation Ratios: 30 % (ref 10.4–31.8)
TIBC: 238 ug/dL — ABNORMAL LOW (ref 250–450)
UIBC: 166 ug/dL

## 2022-11-16 LAB — FERRITIN: Ferritin: 143 ng/mL (ref 11–307)

## 2022-11-16 LAB — LACTATE DEHYDROGENASE: LDH: 158 U/L (ref 98–192)

## 2022-11-16 NOTE — Progress Notes (Signed)
[facility] OFFICE PROGRESS NOTE  Patient Care Team: Lynnea Ferrier, MD as PCP - General (Internal Medicine) Lanier Prude, MD as PCP - Electrophysiology (Cardiology) Earna Coder, MD as Consulting Physician (Internal Medicine) Stanton Kidney, MD as Consulting Physician (Gastroenterology) Lanier Prude, MD as Consulting Physician (Cardiology)   Cancer Staging  Primary malignant neoplasm of right lower lobe of lung [facility] Staging form: Lung, AJCC 7th Edition - Clinical: No stage assigned - Unsigned    Oncology History Overview Note  # DEC 2017- Adeno ca [GATA; her 2 Neu-NEG]; signet ring [1.5 x3 mm gastric incisura; Dr.Skulskie]; EUS [Dr.Burnbridge]; no significant abnormality noted;  Reviewed at [facility] also. JAN 2018- PET NED. April 2018- S/p partial gastrectomy [Dr.Sankar]- STAGE I ADENO CA; NO adjuvant therapy.  # STAGE I CARCINOID s/p partial gastrectomy   # May 2018- Chronic Atrophic gastritis- Prevpac   # FEB 2017- ADENOCARCINOMA with Lepidic 80%-20% acinar pattern; pT2a [Stage IB;T-2.3cm; visceral pleural invasion present; pN=0 ]; AUG 2017- CT NED;   # DEC 2019- RECURRENT/STAGE IV ADENO LUNG CA- EGFR MUTATED;# Jan 6th 2020-; START Osidemrtinib;  # AUG 2020- SEVERE AS [s/p TAVR; [place]]-complicated by R sided stroke/ seizures/acute renal failure.   # DEC 2nd 2020- START PROMACTA 25 mg/day [ITP]  # Molecular testing: EFGR mutated [id number] [omniseq]  # Palliative: O-1/20   DIAGNOSIS:  #ADENO CA LUNG-STAGE IV #Stomach adeno ca [stage I; dec 2017]  GOALS: pallaitive  CURRENT/MOST RECENT THERAPY - OSIMERTINIB [Jan 6th 2020]      Primary malignant neoplasm of right lower lobe of lung (HCC)  12/02/2015 Initial Diagnosis   Primary malignant neoplasm of right lower lobe of lung (HCC)   Adenocarcinoma of gastric cardia (HCC)   INTERVAL HISTORY: Alone.  Ambulating independantly.   Stacy Cook 87 y.o.  female pleasant patient above  history of recurrent/stage IV adenocarcinoma lung/EGFR mutated currently on osimertinib; ITP on promacta; A.fib on eliquis is here for follow-up/and review the results of CT scan.  Chronic fatigue; not any worse.   Patient had one episode of Blood in stool: yes about 10 days ago with diarrhea, bright red, turn water in toilet red. But has cleared up.   Currently on eliquis 2.5 mg BID by cardiology. No hemoptysis.  Denies any worsening swelling of the legs.  Patient states that she is compliant with her oral iron.  No constipation.  Review of Systems  Constitutional:  Positive for malaise/fatigue. Negative for chills, diaphoresis and fever.  HENT:  Negative for nosebleeds and sore throat.   Eyes:  Negative for double vision.  Respiratory:  Negative for hemoptysis, sputum production and wheezing.   Cardiovascular:  Negative for chest pain, palpitations, orthopnea and leg swelling.  Gastrointestinal:  Negative for blood in stool, constipation, melena, nausea and vomiting.  Genitourinary:  Negative for dysuria, frequency and urgency.  Musculoskeletal:  Positive for back pain and joint pain.  Neurological:  Positive for focal weakness. Negative for dizziness, tingling, weakness and headaches.  Endo/Heme/Allergies:  Bruises/bleeds easily.  Psychiatric/Behavioral:  Negative for depression. The patient is not nervous/anxious and does not have insomnia.       PAST MEDICAL HISTORY :  Past Medical History:  Diagnosis Date   Abdominal pain    Acid reflux 03/24/2015   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   Allergic genetic state    Anemia    Arteriosclerosis of coronary artery 03/24/2015   Arthritis    "back, hands" (10/15/2017)  Asthma    B12 deficiency anemia    Basal cell carcinoma 01/01/2022   glabella, [facility]   Basal cell carcinoma 05/03/2022   R ant deltoid, [facility]   Basal cell carcinoma 05/03/2022   Xyphoid excised 06/12/2022   Bile reflux gastritis    CAD (coronary artery  disease)    Cancer of right lung (HCC) 05/09/2015   Dr. Thelma Barge performed Right lower lobe lobectomy.    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 08/03/2016   Carotid stenosis 02/07/2016   Cataract cortical, senile    Chest pain    Colon polyp    Degeneration of intervertebral disc of lumbar region 03/11/2014   Diabetes (HCC)    Gastric adenocarcinoma (HCC)    GERD (gastroesophageal reflux disease)    also, history of ulcers   History of kidney stones    History of stroke    HLD (hyperlipidemia) 08/24/2013   Hypertension    Malignant tumor of stomach (HCC) 07/2016   Adenocarcinoma, diffuse, poorly differentiated, signet ring, stage I   Neuritis or radiculitis due to rupture of lumbar intervertebral disc 09/10/2014   Neuroendocrine tumor 03/24/2015   Neuroma    Osteoporosis    Peripheral vascular disease (HCC)    PONV (postoperative nausea and vomiting)    Presence of permanent cardiac pacemaker 12/09/2018   Primary malignant neoplasm of right lower lobe of lung (HCC) 12/02/2015   Renal insufficiency    Renal stone    S/P TAVR (transcatheter aortic valve replacement)    Sciatica    Severe aortic stenosis    Skin cancer    "cut/burned LUE; cut off right eye/nose & cut off chest" (10/15/2017)   Stomach ulcer    Thrombocytopenia (HCC)    Type 2 diabetes, diet controlled (HCC)    "no RX since stomach OR 07/2016" (10/15/2017)    PAST SURGICAL HISTORY :   Past Surgical History:  Procedure Laterality Date   APPENDECTOMY     CARDIAC CATHETERIZATION  X2 before 10/15/2017   CARDIAC VALVE REPLACEMENT     CATARACT EXTRACTION W/PHACO Left 04/04/2017   Procedure: CATARACT EXTRACTION PHACO AND INTRAOCULAR LENS PLACEMENT (IOC);  Surgeon: Lockie Mola, MD;  Location: [facility] ORS;  Service: Ophthalmology;  Laterality: Left;  Lot # [id number] H [place] 1:00 Ap 25% CDE 8.54   CATARACT EXTRACTION W/PHACO Right 05/15/2017   Procedure: CATARACT EXTRACTION PHACO AND INTRAOCULAR LENS PLACEMENT (IOC);   Surgeon: Lockie Mola, MD;  Location: [facility] ORS;  Service: Ophthalmology;  Laterality: Right;  [place] 01:10 AP% 18.3 CDE 12.91 Fluid pack lot # [id number]   COLONOSCOPY     CORONARY ATHERECTOMY N/A 10/15/2017   Procedure: CORONARY ATHERECTOMY;  Surgeon: Kathleene Hazel, MD;  Location: [facility];  Service: Cardiovascular;  Laterality: N/A;   CORONARY STENT INTERVENTION N/A 10/15/2017   Procedure: CORONARY STENT INTERVENTION;  Surgeon: Kathleene Hazel, MD;  Location: [facility];  Service: Cardiovascular;  Laterality: N/A;   CYSTOSCOPY/URETEROSCOPY/HOLMIUM LASER/STENT PLACEMENT Left 08/03/2019   Procedure: CYSTOSCOPY/URETEROSCOPY/HOLMIUM LASER/STENT PLACEMENT;  Surgeon: Vanna Scotland, MD;  Location: [facility] ORS;  Service: Urology;  Laterality: Left;   ESOPHAGOGASTRODUODENOSCOPY     ESOPHAGOGASTRODUODENOSCOPY (EGD) WITH PROPOFOL N/A 03/27/2016   Procedure: ESOPHAGOGASTRODUODENOSCOPY (EGD) WITH PROPOFOL;  Surgeon: Christena Deem, MD;  Location: [facility] ENDOSCOPY;  Service: Endoscopy;  Laterality: N/A;   ESOPHAGOGASTRODUODENOSCOPY (EGD) WITH PROPOFOL N/A 05/28/2016   Procedure: ESOPHAGOGASTRODUODENOSCOPY (EGD) WITH PROPOFOL;  Surgeon: Christena Deem, MD;  Location: [facility] ENDOSCOPY;  Service: Endoscopy;  Laterality: N/A;  ESOPHAGOGASTRODUODENOSCOPY (EGD) WITH PROPOFOL N/A 09/25/2016   Procedure: ESOPHAGOGASTRODUODENOSCOPY (EGD) WITH PROPOFOL;  Surgeon: Kieth Brightly, MD;  Location: [facility] ENDOSCOPY;  Service: Endoscopy;  Laterality: N/A;   ESOPHAGOGASTRODUODENOSCOPY (EGD) WITH PROPOFOL N/A 12/18/2016   Procedure: ESOPHAGOGASTRODUODENOSCOPY (EGD) WITH PROPOFOL;  Surgeon: Kieth Brightly, MD;  Location: [facility] ENDOSCOPY;  Service: Endoscopy;  Laterality: N/A;   ESOPHAGOGASTRODUODENOSCOPY (EGD) WITH PROPOFOL N/A 01/23/2017   Procedure: ESOPHAGOGASTRODUODENOSCOPY (EGD) WITH PROPOFOL;  Surgeon: Kieth Brightly, MD;  Location: [facility] ENDOSCOPY;  Service:  Endoscopy;  Laterality: N/A;   ESOPHAGOGASTRODUODENOSCOPY (EGD) WITH PROPOFOL N/A 03/20/2017   Procedure: ESOPHAGOGASTRODUODENOSCOPY (EGD) WITH PROPOFOL;  Surgeon: Kieth Brightly, MD;  Location: [facility] ENDOSCOPY;  Service: Endoscopy;  Laterality: N/A;   ESOPHAGOGASTRODUODENOSCOPY (EGD) WITH PROPOFOL N/A 09/23/2020   Procedure: ESOPHAGOGASTRODUODENOSCOPY (EGD) WITH PROPOFOL;  Surgeon: Regis Bill, MD;  Location: [facility] ENDOSCOPY;  Service: Endoscopy;  Laterality: N/A;  [facility] SAYS "AMPICILLIN IS NEEDED"   FRACTURE SURGERY     INSERT / REPLACE / REMOVE PACEMAKER  12/09/2018   PACEMAKER LEADLESS INSERTION N/A 12/09/2018   Procedure: PACEMAKER LEADLESS INSERTION;  Surgeon: Hillis Range, MD;  [facility];  Service: Cardiovascular;  Laterality: N/A;   PARTIAL GASTRECTOMY N/A 08/03/2016   Hemigastrectomy, Billroth I reconstruction Surgeon: Kieth Brightly, MD;  Location: [facility] ORS;  Service: General;  Laterality: N/A;   RIGHT/LEFT HEART CATH AND CORONARY ANGIOGRAPHY Bilateral 09/19/2017   Procedure: RIGHT/LEFT HEART CATH AND CORONARY ANGIOGRAPHY;  Surgeon: Alwyn Pea, MD;  Location: [facility] CV [facility];  Service: Cardiovascular;  Laterality: Bilateral;   SHOULDER ARTHROSCOPY W/ CAPSULAR REPAIR Right    SKIN CANCER EXCISION     "cut/burned LUE; cut off right eye/nose & cut off chest" (10/15/2017)   TEE WITHOUT CARDIOVERSION N/A 12/02/2018   Procedure: TRANSESOPHAGEAL ECHOCARDIOGRAM (TEE);  Surgeon: Kathleene Hazel, MD;  Location: [facility] INVASIVE CV LAB;  Service: Open Heart Surgery;  Laterality: N/A;   THORACOTOMY Right 05/09/2015   Procedure: THORACOTOMY, RIGHT LOWER LOBECTOMY, BRONCHOSCOPY;  Surgeon: Hulda Marin, MD;  Location: [facility] ORS;  Service: Thoracic;  Laterality: Right;   TONSILLECTOMY  1944   TRANSCATHETER AORTIC VALVE REPLACEMENT, TRANSFEMORAL N/A 12/02/2018   Procedure: TRANSCATHETER AORTIC VALVE REPLACEMENT, TRANSFEMORAL;  Surgeon: Kathleene Hazel, MD;  [facility];  Service: Open Heart Surgery;  Laterality: N/A;   TUBAL LIGATION     UPPER GI ENDOSCOPY N/A 08/03/2016   Procedure: UPPER  ENDOSCOPY;  Surgeon: Kieth Brightly, MD;  Location: [facility] ORS;  Service: General;  Laterality: N/A;   VAGINAL HYSTERECTOMY     WRIST FRACTURE SURGERY Right     FAMILY HISTORY :   Family History  Problem Relation Age of Onset   Aortic aneurysm Mother    Kidney Stones Mother    Hypertension Mother    Hypertension Father    Diabetes Other    Breast cancer Neg Hx    Prostate cancer Neg Hx    Bladder Cancer Neg Hx    Kidney cancer Neg Hx     SOCIAL HISTORY:   Social History   Tobacco Use   Smoking status: Never   Smokeless tobacco: Never  Vaping Use   Vaping status: Never Used  Substance Use Topics   Alcohol use: Not Currently   Drug use: Never    ALLERGIES:  is allergic to mirtazapine, pneumococcal vaccine, statins, and sulfa antibiotics.  MEDICATIONS:  Current Outpatient Medications  Medication Sig Dispense Refill   apixaban (ELIQUIS) 2.5  MG TABS tablet Take 1 tablet by mouth twice daily 60 tablet 5   cholecalciferol (VITAMIN D) 1000 UNITS tablet Take 1,000 Units by mouth daily.     cyanocobalamin 1000 MCG tablet Take 1,000 mcg by mouth daily.      eltrombopag (PROMACTA) 25 MG tablet Take 1 tablet (25 mg total) by mouth daily. Take on an empty stomach, 1 hour before a meal or 2 hours after. 30 tablet 7   ezetimibe (ZETIA) 10 MG tablet Take 10 mg by mouth daily.     ferrous sulfate 325 (65 FE) MG tablet Take 325 mg by mouth daily with breakfast.     isosorbide mononitrate (IMDUR) 30 MG 24 hr tablet Take 1 tablet by mouth once daily 90 tablet 2   Magnesium 100 MG TABS Take by mouth.     metoprolol succinate (TOPROL-XL) 50 MG 24 hr tablet Take 1 tablet (50 mg total) by mouth every evening. Take with or immediately following a meal. 30 tablet 0   osimertinib mesylate (TAGRISSO) 80 MG tablet Take 1  tablet (80 mg total) by mouth daily. 30 tablet 11   potassium citrate (UROCIT-K) 10 MEQ (1080 MG) SR tablet Take 1 tablet by mouth once daily 90 tablet 0   No current facility-administered medications for this visit.    PHYSICAL EXAMINATION: ECOG PERFORMANCE STATUS: 0 - Asymptomatic  BP (!) 106/47 (BP Location: Left Arm, Patient Position: Sitting, Cuff Size: Normal)   Pulse 83   Temp 97.8 F (36.6 C) (Tympanic)   Ht 5\' 1"  (1.549 m)   Wt 108 lb 9.6 oz (49.3 kg)   SpO2 98%   BMI 20.52 kg/m   Filed Weights   11/16/22 1323  Weight: 108 lb 9.6 oz (49.3 kg)   Decreased breath sounds on the right side compared to left.  Physical Exam Constitutional:      Comments: Patient walking by herself; no assistive devices.  Alone.  HENT:     Head: Normocephalic and atraumatic.     Mouth/Throat:     Pharynx: No oropharyngeal exudate.  Eyes:     Pupils: Pupils are equal, round, and reactive to light.  Cardiovascular:     Rate and Rhythm: Normal rate and regular rhythm.     Heart sounds: Murmur heard.  Pulmonary:     Effort: No respiratory distress.     Breath sounds: No wheezing.  Abdominal:     General: Bowel sounds are normal. There is no distension.     Palpations: Abdomen is soft. There is no mass.     Tenderness: There is no abdominal tenderness. There is no guarding or rebound.  Musculoskeletal:        General: No tenderness. Normal range of motion.     Cervical back: Normal range of motion and neck supple.  Skin:    General: Skin is warm.  Neurological:     Mental Status: She is alert and oriented to person, place, and time.     Comments: Weakness of the right upper extremity more than lower extremity.  Psychiatric:        Mood and Affect: Affect normal.    LABORATORY DATA:  I have reviewed the data as listed    Component Value Date/Time   NA 137 11/16/2022 1306   NA 141 02/05/2022 1405   NA 141 07/21/2012 1529   K 3.9 11/16/2022 1306   K 3.9 07/21/2012 1529   CL  106 11/16/2022 1306   CL 109 (H) 07/21/2012  1529   CO2 23 11/16/2022 1306   CO2 27 07/21/2012 1529   GLUCOSE 105 (H) 11/16/2022 1306   GLUCOSE 104 (H) 07/21/2012 1529   BUN 29 (H) 11/16/2022 1306   BUN 34 (H) 02/05/2022 1405   BUN 16 07/21/2012 1529   CREATININE 1.38 (H) 11/16/2022 1306   CREATININE 1.11 09/03/2012 1535   CALCIUM 8.8 (L) 11/16/2022 1306   CALCIUM 8.9 07/21/2012 1529   PROT 6.4 (L) 11/16/2022 1306   PROT 7.1 07/21/2012 1529   ALBUMIN 3.8 11/16/2022 1306   ALBUMIN 3.9 07/21/2012 1529   AST 20 11/16/2022 1306   ALT 15 11/16/2022 1306   ALT 22 07/21/2012 1529   ALKPHOS 91 11/16/2022 1306   ALKPHOS 76 07/21/2012 1529   BILITOT 0.5 11/16/2022 1306   GFRNONAA 38 (L) 11/16/2022 1306   GFRNONAA 49 (L) 09/03/2012 1535   GFRAA 29 (L) 01/05/2020 1440   GFRAA 57 (L) 09/03/2012 1535    No results found for: "SPEP", "UPEP"  Lab Results  Component Value Date   WBC 5.3 11/16/2022   NEUTROABS 3.8 11/16/2022   HGB 10.6 (L) 11/16/2022   HCT 31.5 (L) 11/16/2022   MCV 92.9 11/16/2022   PLT 110 (L) 11/16/2022      Chemistry      Component Value Date/Time   NA 137 11/16/2022 1306   NA 141 02/05/2022 1405   NA 141 07/21/2012 1529   K 3.9 11/16/2022 1306   K 3.9 07/21/2012 1529   CL 106 11/16/2022 1306   CL 109 (H) 07/21/2012 1529   CO2 23 11/16/2022 1306   CO2 27 07/21/2012 1529   BUN 29 (H) 11/16/2022 1306   BUN 34 (H) 02/05/2022 1405   BUN 16 07/21/2012 1529   CREATININE 1.38 (H) 11/16/2022 1306   CREATININE 1.11 09/03/2012 1535      Component Value Date/Time   CALCIUM 8.8 (L) 11/16/2022 1306   CALCIUM 8.9 07/21/2012 1529   ALKPHOS 91 11/16/2022 1306   ALKPHOS 76 07/21/2012 1529   AST 20 11/16/2022 1306   ALT 15 11/16/2022 1306   ALT 22 07/21/2012 1529   BILITOT 0.5 11/16/2022 1306       RADIOGRAPHIC STUDIES: I have personally reviewed the radiological images as listed and agreed with the findings in the report. No results found.   ASSESSMENT &  PLAN:  Primary malignant neoplasm of right lower lobe of lung (HCC) # Stage IV recurrent adenocarcinoma the lung; EGFR mutated. On osimertinib 80 mg [Feb 6th 2020]. November 07, 2022- Status post right lower lobectomy. Similar loculated, thick-walled right-sided pleural fluid collection with resolution of pleural air. Similar adjacent collapse/consolidation within the right lung base. No convincing evidence of recurrent or metastatic disease. No acute process or evidence of metastatic disease in the abdomen/pelvis.   # currently on Osimertinib 80 mg/day; tolerating well. See above/ Continue current therapy-no obvious evidence of recurrent malignancy at this time. Thoracentesis negative any malignant cells.  Stable  # Blood in stool- 1 episode [on eliquis]- resolved [2 weeks ago]; caution while on Eliquis.  No recent colonoscopies.  If worse recommend-calling GI regarding further recommendations.   #Right pleural effusion-status postthoracentesis-pleural fluid: Transudative; cytology negative for malignancy.  Question CHF/fluid overload.  S/p Lasix 20 mg a day- improved/stable. Currently not on any lasix [ARF].  stable  # Chronic anemia- Hb 10-18 Dec 2021- Iron sat- 22%; ferritin- 137- continue Iron pills once day. Hold off Venofer. stable  # ITP- chronic- platelets> 100 on promacta 25  mg/day- stable  # Stomach cancer stage I status post gastrectomy; June 2022- S/p EGD- [facility].-stable;   # A-fib [feb 2024] on Eliquis 2.5/cardiology.  stable.  # right sided stroke/ seizures-on asprin; off plavix;; f/u neurology-[place]-  stable.  # CKD- stage IV; Left Hydronephrosis s/p ureteral stent placement/nephrolithiasis-~ GFR 34 today; Hypokalemia-3.4  continue K-citrate q day. Stable  #Incidental findings on Imaging  CT , 2024: gall stones; atherosclerosis; emphysema reviewed/discussed/counseled the patient.  # IV ACCESS: PIV  # DISPOSITION: # no venofer today # follow up in 2 months- MD: labs- cbc/cmp;LDH;  possible IV venofer;   Dr.B  # I reviewed the blood work- with the patient in detail; also reviewed the imaging independently [as summarized above]; and with the patient in detail.         No orders of the defined types were placed in this encounter.  All questions were answered. The patient knows to call the clinic with any problems, questions or concerns.      Earna Coder, MD 11/16/2022 2:21 PM

## 2022-11-16 NOTE — Progress Notes (Signed)
Fatigue/weakness: yes Dyspena: yes Light headedness: occasionally Blood in stool: yes about 10 days ago with diarrhea, bright red, turn water in toilet red. But has cleared up.

## 2022-11-16 NOTE — Assessment & Plan Note (Addendum)
#   Stage IV recurrent adenocarcinoma the lung; EGFR mutated. On osimertinib 80 mg [Feb 6th 2020]. November 07, 2022- Status post right lower lobectomy. Similar loculated, thick-walled right-sided pleural fluid collection with resolution of pleural air. Similar adjacent collapse/consolidation within the right lung base. No convincing evidence of recurrent or metastatic disease. No acute process or evidence of metastatic disease in the abdomen/pelvis.   # currently on Osimertinib 80 mg/day; tolerating well. See above/ Continue current therapy-no obvious evidence of recurrent malignancy at this time. Thoracentesis negative any malignant cells.  Stable  # Blood in stool- 1 episode [on eliquis]- resolved [2 weeks ago]; caution while on Eliquis.  No recent colonoscopies.  If worse recommend-calling GI regarding further recommendations.   #Right pleural effusion-status postthoracentesis-pleural fluid: Transudative; cytology negative for malignancy.  Question CHF/fluid overload.  S/p Lasix 20 mg a day- improved/stable. Currently not on any lasix [ARF].  stable  # Chronic anemia- Hb 10-18 Dec 2021- Iron sat- 22%; ferritin- 137- continue Iron pills once day. Hold off Venofer. stable  # ITP- chronic- platelets> 100 on promacta 25 mg/day- stable  # Stomach cancer stage I status post gastrectomy; June 2022- S/p EGD- [facility].-stable;   # A-fib [feb 2024] on Eliquis 2.5/cardiology.  stable.  # right sided stroke/ seizures-on asprin; off plavix;; f/u neurology-[place]-  stable.  # CKD- stage IV; Left Hydronephrosis s/p ureteral stent placement/nephrolithiasis-~ GFR 34 today; Hypokalemia-3.4  continue K-citrate q day. Stable  #Incidental findings on Imaging  CT , 2024: gall stones; atherosclerosis; emphysema reviewed/discussed/counseled the patient.  # IV ACCESS: PIV  # DISPOSITION: # no venofer today # follow up in 2 months- MD: labs- cbc/cmp;LDH; possible IV venofer;   Dr.B  # I reviewed the blood work- with the  patient in detail; also reviewed the imaging independently [as summarized above]; and with the patient in detail.

## 2022-12-11 ENCOUNTER — Other Ambulatory Visit: Payer: Self-pay | Admitting: Internal Medicine

## 2022-12-12 ENCOUNTER — Encounter: Payer: Self-pay | Admitting: Internal Medicine

## 2022-12-12 NOTE — Telephone Encounter (Signed)
Component Ref Range & Units 3 wk ago (11/16/22) 3 mo ago (09/10/22) 5 mo ago (07/13/22) 7 mo ago (05/14/22) 9 mo ago (03/07/22) 10 mo ago (02/05/22) 10 mo ago (01/25/22)  Potassium 3.5 - 5.1 mmol/L 3.9 3.8 4.2 3.6 4.4 4.3 R 3.5 R

## 2023-01-02 ENCOUNTER — Ambulatory Visit: Payer: Medicare PPO | Admitting: Cardiology

## 2023-01-03 ENCOUNTER — Ambulatory Visit: Payer: Medicare PPO | Attending: Cardiology | Admitting: Physician Assistant

## 2023-01-03 ENCOUNTER — Encounter: Payer: Self-pay | Admitting: Physician Assistant

## 2023-01-03 VITALS — BP 140/60 | HR 63 | Ht 61.0 in | Wt 107.6 lb

## 2023-01-03 DIAGNOSIS — I2510 Atherosclerotic heart disease of native coronary artery without angina pectoris: Secondary | ICD-10-CM

## 2023-01-03 DIAGNOSIS — Z950 Presence of cardiac pacemaker: Secondary | ICD-10-CM

## 2023-01-03 DIAGNOSIS — I4891 Unspecified atrial fibrillation: Secondary | ICD-10-CM

## 2023-01-03 DIAGNOSIS — I5032 Chronic diastolic (congestive) heart failure: Secondary | ICD-10-CM

## 2023-01-03 DIAGNOSIS — Z952 Presence of prosthetic heart valve: Secondary | ICD-10-CM

## 2023-01-03 DIAGNOSIS — I459 Conduction disorder, unspecified: Secondary | ICD-10-CM

## 2023-01-03 MED ORDER — AMOXICILLIN 500 MG PO TABS: 2000.0000 mg | ORAL_TABLET | Freq: Once | ORAL | 1 refills | Status: AC

## 2023-01-03 MED ORDER — AMOXICILLIN 500 MG PO TABS: 2000.0000 mg | ORAL_TABLET | Freq: Once | ORAL | 1 refills | Status: DC

## 2023-01-03 NOTE — Progress Notes (Signed)
Cardiology Office Note:  .   Date:  01/03/2023  ID:  Stacy Cook, DOB 05-Jun-1935, MRN [id number] PCP: Lynnea Ferrier, MD  [facility] Providers Cardiologist:  None Electrophysiologist:  Lanier Prude, MD {  History of Present Illness: .   Stacy Cook is a 87 y.o. female with history of gastric cancer status post partial gastrectomy in 2019, lung cancer status post right lower lobectomy in 2017 with reoccurrence and malignant pleural effusion, pancytopenia, CKD stage III, carotid artery disease, diabetes, HLD with statin intolerance, HTN, multivessel CAD status post PCI and severe AS status post TAVR (12/02/2018) complicated by acute CVA with right hemiparesis and CHB status post leadless PPM (12/09/2018).  Echocardiogram August 2018 showed normal LV systolic function with LVEF 55%, moderate LVH and severe aortic stenosis with mean gradient 43 mmHg.  Cardiac cath June 2019 at [facility] showed severe three-vessel CAD.  Referred to Dr. Clifton James 09/2017 for consultation of multivessel PCI and TAVR.  She complained of DOE and chest pressure as well as near syncope.  Repeat echo 10/11/2017 showed EF 66 5%, moderate AAS and mild AR, mean gradient 31 mmHg, peak gradient 52 mmHg, DVI 0.34, AVA 0.56 cm.  Underwent cardiac cath 10/15/2017 with PCI/DES to distal RCA and orbital atherectomy/DES to mid LCx.  Was placed on DAPT with aspirin/Plavix.  Cath showed AAS with mean gradient 17 mmHg felt to be moderately severe.  She had an improvement in chest pain after cath.  Plan for following AAS.  Repeat echo 01/20/2018 with LVEF 65 to 70%, severe AS.  Plans for TAVR.  Pre-TAVR CTs revealed new pleural effusion suspicious for recurrent malignancy.  Cytology confirmed the presence of malignant cells and reoccurrence of her lung cancer.  Referred to oncology and TAVR was delayed.  Treated with oral chemotherapy and had 2 thoracenteses.  Significant DOE and fatigue felt to be related to severe AS.  CT  of the chest 10/2018 showed stable partially loculated right pleural effusion.  Reevaluated by multidisciplinary valve team and felt to be suitable candidate for TAVR.  Underwent successful TAVR with Medtronic E volt Pro plus TVH that he has a TF approach 12/02/2018.  Postop echo with EF 65%, normal functioning TAVR with mean gradient of 6 and mild PVL.  Unfortunately, developed right-sided weakness and numbness the evening after TAVR.  CTA of the head and neck showed diffuse atherosclerosis.  MRI showed left pontine and frontal embolic stroke.  Was discharged to inpatient rehab where she developed CHB and was readmitted for leadless pacemaker on 12/09/2018.  She went back to inpatient rehab and was discharged home 12/27/2018.   She was seen by Dr. Shari Prows 01/16/2022 and she had new nocturnal dyspnea and cough as well as intermittent lower extremity edema.  CT showed right-sided pleural effusion and new small left-sided pleural effusion possibly reflective of CHF.  Started on Lasix 20 mg daily.  Otherwise, chronic DOE that is stable.  Felt off balance since her stroke.  BNP was 6035.  Advised to increase the Lasix to 40 mg twice a day for 4 days then back down to 40 daily.  Creatinine unfortunately bumped to 2.27.  Eventually serum creatinine improved to 1.31.  Echo revealed LVEF 65% with mildly elevated pulmonary artery systolic pressure, normally functioning AV without significant PVL.  She was seen for follow-up February 2024 and at that time reported she was feeling well but was diagnosed with a lung infection recently.  States she had right-sided chest  pain since being told she has an infection.  Got dehydrated with Lasix that she stopped.  Home BP 113/60.  No significant chest pain or shortness of breath at that appointment.  Today, she tells me that sometimes she does breathe heavier with activity but this has been going on for a while.  No chest pain.  She does not feel her A-fib if she is going in and  out.  Compliant with Eliquis without issues.  Blood pressure 140/60 today.  Occasionally experiences dizziness and lightheadedness which occurs randomly but mostly when she is changing positions.  Sounding like orthostatic hypotension.  Encouraged increasing water intake and wearing lower extremity compression stockings to help.  No syncope.  No falls.  Echocardiogram scheduled for next month.  No swelling in her legs that she is noticed.  She does need a refill of her amoxicillin for upcoming dental work.  She should not need to hold her Eliquis for simple cleanings and simple extractions (1 tooth).  She does mention that occasionally she has a bandlike sensation around her bra area even when she is not wearing a bra.  She cannot remember what her symptoms were back when she had stents of her coronary arteries.  She says it is not severe.  If this gets worse she is to let us know.  She states that she has had asthma dating back to when she was a little girl.  She was given adrenaline in her arm at that time and wondered if it has anything to do with her current heart problems.  Reports no shortness of breath nor dyspnea on exertion. Reports no chest pain, pressure, or tightness. No edema, orthopnea, PND. Reports no palpitations.    ROS: Pertinent ROS in HPI  Studies Reviewed: .       Echocardiogram 02/05/2022 IMPRESSIONS     1. Left ventricular ejection fraction, by estimation, is 60 to 65%. The  left ventricle has normal function. The left ventricle has no regional  wall motion abnormalities. There is mild left ventricular hypertrophy.  Left ventricular diastolic parameters  are indeterminate.   2. Right ventricular systolic function is normal. The right ventricular  size is normal. There is mildly elevated pulmonary artery systolic  pressure. The estimated right ventricular systolic pressure is 44.5 mmHg.   3. Left atrial size was moderately dilated.   4. The mitral valve is normal in  structure. Mild mitral valve  regurgitation. No evidence of mitral stenosis. Severe mitral annular  calcification.   5. The aortic valve has been repaired/replaced. Aortic valve  regurgitation is not visualized. No aortic stenosis is present. There is a  23 mm CoreValve-Evolut Pro prosthetic (TAVR) valve present in the aortic  position. Procedure Date: 12/02/2018. Echo  findings are consistent with normal structure and function of the aortic  valve prosthesis. Aortic valve mean gradient measures 9.4 mmHg. Aortic  valve Vmax measures 2.16 m/s.   6. The inferior vena cava is normal in size with greater than 50%  respiratory variability, suggesting right atrial pressure of 3 mmHg.   Comparison(s): No significant change from prior study.   FINDINGS   Left Ventricle: Left ventricular ejection fraction, by estimation, is 60  to 65%. The left ventricle has normal function. The left ventricle has no  regional wall motion abnormalities. The left ventricular internal cavity  size was normal in size. There is   mild left ventricular hypertrophy. Left ventricular diastolic parameters  are indeterminate.     LV Wall  Scoring:  The mid and distal anterior wall, mid and distal anterior septum, and  entire  apex are akinetic.   Right Ventricle: The right ventricular size is normal. No increase in  right ventricular wall thickness. Right ventricular systolic function is  normal. There is mildly elevated pulmonary artery systolic pressure. The  tricuspid regurgitant velocity is 3.22   m/s, and with an assumed right atrial pressure of 3 mmHg, the estimated  right ventricular systolic pressure is 44.5 mmHg.   Left Atrium: Left atrial size was moderately dilated.   Right Atrium: Right atrial size was normal in size.   Pericardium: There is no evidence of pericardial effusion.   Mitral Valve: The mitral valve is normal in structure. There is moderate  thickening of the mitral valve leaflet(s).  There is moderate calcification  of the mitral valve leaflet(s). Severe mitral annular calcification. Mild  mitral valve regurgitation. No  evidence of mitral valve stenosis.   Tricuspid Valve: The tricuspid valve is normal in structure. Tricuspid  valve regurgitation is mild . No evidence of tricuspid stenosis.   Aortic Valve: The aortic valve has been repaired/replaced. Aortic valve  regurgitation is not visualized. No aortic stenosis is present. Aortic  valve mean gradient measures 9.4 mmHg. Aortic valve peak gradient measures  18.7 mmHg. Aortic valve area, by VTI   measures 0.82 cm. There is a 23 mm CoreValve-Evolut Pro prosthetic,  stented (TAVR) valve present in the aortic position. Procedure Date:  12/02/2018. Echo findings are consistent with normal structure and  function of the aortic valve prosthesis.   Pulmonic Valve: The pulmonic valve was normal in structure. Pulmonic valve  regurgitation is mild. No evidence of pulmonic stenosis.   Aorta: The aortic root is normal in size and structure.   Venous: The inferior vena cava is normal in size with greater than 50%  respiratory variability, suggesting right atrial pressure of 3 mmHg.   IAS/Shunts: No atrial level shunt detected by color flow Doppler.      Risk Assessment/Calculations:    CHA2DS2-VASc Score = 8   This indicates a 10.8% annual risk of stroke. The patient's score is based upon: CHF History: 0 HTN History: 1 Diabetes History: 1 Stroke History: 2 Vascular Disease History: 1 Age Score: 2 Gender Score: 1        Physical Exam:   VS:  BP (!) 140/60   Pulse 63   Ht 5\' 1"  (1.549 m)   Wt 107 lb 9.6 oz (48.8 kg)   SpO2 97%   BMI 20.33 kg/m    Wt Readings from Last 3 Encounters:  01/03/23 107 lb 9.6 oz (48.8 kg)  11/16/22 108 lb 9.6 oz (49.3 kg)  09/28/22 107 lb 9.6 oz (48.8 kg)    GEN: Well nourished, well developed in no acute distress NECK: No JVD; No carotid bruits CARDIAC: RRR, + systolic  murmurs, rubs, gallops RESPIRATORY:  Clear to auscultation without rales, wheezing or rhonchi  ABDOMEN: Soft, non-tender, non-distended EXTREMITIES:  No edema; No deformity   ASSESSMENT AND PLAN: .   1.  Atrial fibrillation -Doing okay on Eliquis with no major bleeding -She is in normal sinus rhythm today -She is asymptomatic if she does get in and out of her atrial fibrillation  2. Severe AS Status post TAVR -She has a systolic murmur that is present on exam today and she states that it has been like this for a few years -Plan for repeat echocardiogram next month -No syncope or presyncope -Would  continue current medications including Eliquis 2.5 mg twice a day, Zetia 10 mg daily, Imdur 30 mg daily, metoprolol succinate 50 mg daily, magnesium 100 mg daily and potassium 10 mEq daily -Provided her with a new amoxicillin 2 g once an hour before dental procedures prescription  3.  Pacemaker -No recent issues -This was placed right after her TAVR procedure  4.  Coronary artery disease status post PCI -No further chest pain but does endorse some tightness around her bra region -If this gets worse or becomes more bothersome she is to let us know -She cannot remember what her anginal equivalent was when she had her stents placed back in 2019  5.  HFpEF   -She is euvolemic on exam and denies any lower extremity swelling -She will have follow-up echocardiogram next month to reevaluate   Dispo: She can follow-up in 6 months with Dr. Cristal Deer or myself  Signed, Sharlene Dory, PA-C

## 2023-01-03 NOTE — Patient Instructions (Signed)
Medication Instructions:  Take amoxicillin 4 tablets (2000 mg) 1 hour prior to dental appointments. *If you need a refill on your cardiac medications before your next appointment, please call your pharmacy*  Lab Work: None ordered If you have labs (blood work) drawn today and your tests are completely normal, you will receive your results only by: MyChart Message (if you have MyChart) OR A paper copy in the mail If you have any lab test that is abnormal or we need to change your treatment, we will call you to review the results.  Follow-Up: At [facility] ([facility]), you and your health needs are our priority.  As part of our continuing mission to provide you with exceptional heart care, we have created designated Provider Care Teams.  These Care Teams include your primary Cardiologist (physician) and Advanced Practice Providers (APPs -  Physician Assistants and Nurse Practitioners) who all work together to provide you with the care you need, when you need it.  Your next appointment:   6 month(s)  Provider:   Jodelle Red, MD or Jari Favre PA-C  Other Instructions Increase water intake Wear compression socks/stockings  Low-Sodium Eating Plan Salt (sodium) helps you keep a healthy balance of fluids in your body. Too much sodium can raise your blood pressure. It can also cause fluid and waste to be held in your body. Your health care provider or dietitian may recommend a low-sodium eating plan if you have high blood pressure (hypertension), kidney disease, liver disease, or heart failure. Eating less sodium can help lower your blood pressure and reduce swelling. It can also protect your heart, liver, and kidneys. What are tips for following this plan? Reading food labels  Check food labels for the amount of sodium per serving. If you eat more than one serving, you must multiply the listed amount by the number of servings. Choose foods with less than 140 milligrams (mg) of sodium  per serving. Avoid foods with 300 mg of sodium or more per serving. Always check how much sodium is in a product, even if the label says "unsalted" or "no salt added." Shopping  Buy products labeled as "low-sodium" or "no salt added." Buy fresh foods. Avoid canned foods and pre-made or frozen meals. Avoid canned, cured, or processed meats. Buy breads that have less than 80 mg of sodium per slice. Cooking  Eat more home-cooked food. Try to eat less restaurant, buffet, and fast food. Try not to add salt when you cook. Use salt-free seasonings or herbs instead of table salt or sea salt. Check with your provider or pharmacist before using salt substitutes. Cook with plant-based oils, such as canola, sunflower, or olive oil. Meal planning When eating at a restaurant, ask if your food can be made with less salt or no salt. Avoid dishes labeled as brined, pickled, cured, or smoked. Avoid dishes made with soy sauce, miso, or teriyaki sauce. Avoid foods that have monosodium glutamate (MSG) in them. MSG may be added to some restaurant food, sauces, soups, bouillon, and canned foods. Make meals that can be grilled, baked, poached, roasted, or steamed. These are often made with less sodium. General information Try to limit your sodium intake to 1,500-2,300 mg each day, or the amount told by your provider. What foods should I eat? Fruits Fresh, frozen, or canned fruit. Fruit juice. Vegetables Fresh or frozen vegetables. "No salt added" canned vegetables. "No salt added" tomato sauce and paste. Low-sodium or reduced-sodium tomato and vegetable juice. Grains Low-sodium cereals, such as  oats, puffed wheat and rice, and shredded wheat. Low-sodium crackers. Unsalted rice. Unsalted pasta. Low-sodium bread. Whole grain breads and whole grain pasta. Meats and other proteins Fresh or frozen meat, poultry, seafood, and fish. These should have no added salt. Low-sodium canned tuna and salmon. Unsalted nuts.  Dried peas, beans, and lentils without added salt. Unsalted canned beans. Eggs. Unsalted nut butters. Dairy Milk. Soy milk. Cheese that is naturally low in sodium, such as ricotta cheese, fresh mozzarella, or Swiss cheese. Low-sodium or reduced-sodium cheese. Cream cheese. Yogurt. Seasonings and condiments Fresh and dried herbs and spices. Salt-free seasonings. Low-sodium mustard and ketchup. Sodium-free salad dressing. Sodium-free light mayonnaise. Fresh or refrigerated horseradish. Lemon juice. Vinegar. Other foods Homemade, reduced-sodium, or low-sodium soups. Unsalted popcorn and pretzels. Low-salt or salt-free chips. The items listed above may not be all the foods and drinks you can have. Talk to a dietitian to learn more. What foods should I avoid? Vegetables Sauerkraut, pickled vegetables, and relishes. Olives. [place] fries. Onion rings. Regular canned vegetables, except low-sodium or reduced-sodium items. Regular canned tomato sauce and paste. Regular tomato and vegetable juice. Frozen vegetables in sauces. Grains Instant hot cereals. Bread stuffing, pancake, and biscuit mixes. Croutons. Seasoned rice or pasta mixes. Noodle soup cups. Boxed or frozen macaroni and cheese. Regular salted crackers. Self-rising flour. Meats and other proteins Meat or fish that is salted, canned, smoked, spiced, or pickled. Precooked or cured meat, such as sausages or meat loaves. Tomasa Blase. Ham. Pepperoni. Hot dogs. Corned beef. Chipped beef. Salt pork. Jerky. Pickled herring, anchovies, and sardines. Regular canned tuna. Salted nuts. Dairy Processed cheese and cheese spreads. Hard cheeses. Cheese curds. Blue cheese. Feta cheese. String cheese. Regular cottage cheese. Buttermilk. Canned milk. Fats and oils Salted butter. Regular margarine. Ghee. Bacon fat. Seasonings and condiments Onion salt, garlic salt, seasoned salt, table salt, and sea salt. Canned and packaged gravies. Worcestershire sauce. Tartar sauce.  Barbecue sauce. Teriyaki sauce. Soy sauce, including reduced-sodium soy sauce. Steak sauce. Fish sauce. Oyster sauce. Cocktail sauce. Horseradish that you find on the shelf. Regular ketchup and mustard. Meat flavorings and tenderizers. Bouillon cubes. Hot sauce. Pre-made or packaged marinades. Pre-made or packaged taco seasonings. Relishes. Regular salad dressings. Salsa. Other foods Salted popcorn and pretzels. Corn chips and puffs. Potato and tortilla chips. Canned or dried soups. Pizza. Frozen entrees and pot pies. The items listed above may not be all the foods and drinks you should avoid. Talk to a dietitian to learn more. This information is not intended to replace advice given to you by your health care provider. Make sure you discuss any questions you have with your health care provider. Document Revised: 04/12/2022 Document Reviewed: 04/12/2022 Elsevier Patient Education  2024 [employer] Heart-Healthy Eating Plan Many factors influence your heart health, including eating and exercise habits. Heart health is also called coronary health. Coronary risk increases with abnormal blood fat (lipid) levels. A heart-healthy eating plan includes limiting unhealthy fats, increasing healthy fats, limiting salt (sodium) intake, and making other diet and lifestyle changes. What is my plan? Your health care provider may recommend that: You limit your fat intake to _________% or less of your total calories each day. You limit your saturated fat intake to _________% or less of your total calories each day. You limit the amount of cholesterol in your diet to less than _________ mg per day. You limit the amount of sodium in your diet to less than _________ mg per day. What are tips for following this plan? Cooking  Cook foods using methods other than frying. Baking, boiling, grilling, and broiling are all good options. Other ways to reduce fat include: Removing the skin from poultry. Removing all visible  fats from meats. Steaming vegetables in water or broth. Meal planning  At meals, imagine dividing your plate into fourths: Fill one-half of your plate with vegetables and green salads. Fill one-fourth of your plate with whole grains. Fill one-fourth of your plate with lean protein foods. Eat 2-4 cups of vegetables per day. One cup of vegetables equals 1 cup (91 g) broccoli or cauliflower florets, 2 medium carrots, 1 large bell pepper, 1 large sweet potato, 1 large tomato, 1 medium white potato, 2 cups (150 g) raw leafy greens. Eat 1-2 cups of fruit per day. One cup of fruit equals 1 small apple, 1 large banana, 1 cup (237 g) mixed fruit, 1 large orange,  cup (82 g) dried fruit, 1 cup (240 mL) 100% fruit juice. Eat more foods that contain soluble fiber. Examples include apples, broccoli, carrots, beans, peas, and barley. Aim to get 25-30 g of fiber per day. Increase your consumption of legumes, nuts, and seeds to 4-5 servings per week. One serving of dried beans or legumes equals  cup (90 g) cooked, 1 serving of nuts is  oz (12 almonds, 24 pistachios, or 7 walnut halves), and 1 serving of seeds equals  oz (8 g). Fats Choose healthy fats more often. Choose monounsaturated and polyunsaturated fats, such as olive and canola oils, avocado oil, flaxseeds, walnuts, almonds, and seeds. Eat more omega-3 fats. Choose salmon, mackerel, sardines, tuna, flaxseed oil, and ground flaxseeds. Aim to eat fish at least 2 times each week. Check food labels carefully to identify foods with trans fats or high amounts of saturated fat. Limit saturated fats. These are found in animal products, such as meats, butter, and cream. Plant sources of saturated fats include palm oil, palm kernel oil, and coconut oil. Avoid foods with partially hydrogenated oils in them. These contain trans fats. Examples are stick margarine, some tub margarines, cookies, crackers, and other baked goods. Avoid fried foods. General  information Eat more home-cooked food and less restaurant, buffet, and fast food. Limit or avoid alcohol. Limit foods that are high in added sugar and simple starches such as foods made using white refined flour (white breads, pastries, sweets). Lose weight if you are overweight. Losing just 5-10% of your body weight can help your overall health and prevent diseases such as diabetes and heart disease. Monitor your sodium intake, especially if you have high blood pressure. Talk with your health care provider about your sodium intake. Try to incorporate more vegetarian meals weekly. What foods should I eat? Fruits All fresh, canned (in natural juice), or frozen fruits. Vegetables Fresh or frozen vegetables (raw, steamed, roasted, or grilled). Green salads. Grains Most grains. Choose whole wheat and whole grains most of the time. Rice and pasta, including brown rice and pastas made with whole wheat. Meats and other proteins Lean, well-trimmed beef, veal, pork, and lamb. Chicken and [place] without skin. All fish and shellfish. Wild duck, rabbit, pheasant, and venison. Egg whites or low-cholesterol egg substitutes. Dried beans, peas, lentils, and tofu. Seeds and most nuts. Dairy Low-fat or nonfat cheeses, including ricotta and mozzarella. Skim or 1% milk (liquid, powdered, or evaporated). Buttermilk made with low-fat milk. Nonfat or low-fat yogurt. Fats and oils Non-hydrogenated (trans-free) margarines. Vegetable oils, including soybean, sesame, sunflower, olive, avocado, peanut, safflower, corn, canola, and cottonseed. Salad dressings or mayonnaise  made with a vegetable oil. Beverages Water (mineral or sparkling). Coffee and tea. Unsweetened ice tea. Diet beverages. Sweets and desserts Sherbet, gelatin, and fruit ice. Small amounts of dark chocolate. Limit all sweets and desserts. Seasonings and condiments All seasonings and condiments. The items listed above may not be a complete list of  foods and beverages you can eat. Contact a dietitian for more options. What foods should I avoid? Fruits Canned fruit in heavy syrup. Fruit in cream or butter sauce. Fried fruit. Limit coconut. Vegetables Vegetables cooked in cheese, cream, or butter sauce. Fried vegetables. Grains Breads made with saturated or trans fats, oils, or whole milk. Croissants. Sweet rolls. Donuts. High-fat crackers, such as cheese crackers and chips. Meats and other proteins Fatty meats, such as hot dogs, ribs, sausage, bacon, rib-eye roast or steak. High-fat deli meats, such as salami and bologna. Caviar. Domestic duck and goose. Organ meats, such as liver. Dairy Cream, sour cream, cream cheese, and creamed cottage cheese. Whole-milk cheeses. Whole or 2% milk (liquid, evaporated, or condensed). Whole buttermilk. Cream sauce or high-fat cheese sauce. Whole-milk yogurt. Fats and oils Meat fat, or shortening. Cocoa butter, hydrogenated oils, palm oil, coconut oil, palm kernel oil. Solid fats and shortenings, including bacon fat, salt pork, lard, and butter. Nondairy cream substitutes. Salad dressings with cheese or sour cream. Beverages Regular sodas and any drinks with added sugar. Sweets and desserts Frosting. Pudding. Cookies. Cakes. Pies. Milk chocolate or white chocolate. Buttered syrups. Full-fat ice cream or ice cream drinks. The items listed above may not be a complete list of foods and beverages to avoid. Contact a dietitian for more information. Summary Heart-healthy meal planning includes limiting unhealthy fats, increasing healthy fats, limiting salt (sodium) intake and making other diet and lifestyle changes. Lose weight if you are overweight. Losing just 5-10% of your body weight can help your overall health and prevent diseases such as diabetes and heart disease. Focus on eating a balance of foods, including fruits and vegetables, low-fat or nonfat dairy, lean protein, nuts and legumes, whole grains,  and heart-healthy oils and fats. This information is not intended to replace advice given to you by your health care provider. Make sure you discuss any questions you have with your health care provider. Document Revised: 05/01/2021 Document Reviewed: 05/01/2021 Elsevier Patient Education  2024 [employer].

## 2023-01-04 ENCOUNTER — Ambulatory Visit (INDEPENDENT_AMBULATORY_CARE_PROVIDER_SITE_OTHER): Payer: Medicare PPO

## 2023-01-04 DIAGNOSIS — I459 Conduction disorder, unspecified: Secondary | ICD-10-CM | POA: Diagnosis not present

## 2023-01-05 LAB — CUP PACEART REMOTE DEVICE CHECK
Battery Remaining Longevity: 64 mo
Battery Voltage: 2.96 V
Brady Statistic AS VP Percent: 79.39 %
Brady Statistic AS VS Percent: 0 %
Brady Statistic RV Percent Paced: 99.44 %
Date Time Interrogation Session: 20240927105500
Implantable Pulse Generator Implant Date: 20200901
Lead Channel Impedance Value: 490 Ohm
Lead Channel Pacing Threshold Amplitude: 0.75 V
Lead Channel Pacing Threshold Pulse Width: 0.24 ms
Lead Channel Sensing Intrinsic Amplitude: 19.913 mV
Lead Channel Setting Pacing Amplitude: 1.25 V
Lead Channel Setting Pacing Pulse Width: 0.24 ms
Lead Channel Setting Sensing Sensitivity: 2.8 mV

## 2023-01-07 ENCOUNTER — Other Ambulatory Visit: Payer: Self-pay | Admitting: Cardiovascular Disease

## 2023-01-10 NOTE — Progress Notes (Signed)
Remote pacemaker transmission.   

## 2023-01-18 ENCOUNTER — Other Ambulatory Visit: Payer: Self-pay | Admitting: *Deleted

## 2023-01-18 DIAGNOSIS — C3431 Malignant neoplasm of lower lobe, right bronchus or lung: Secondary | ICD-10-CM

## 2023-01-21 ENCOUNTER — Inpatient Hospital Stay (HOSPITAL_BASED_OUTPATIENT_CLINIC_OR_DEPARTMENT_OTHER): Payer: Medicare PPO | Admitting: Internal Medicine

## 2023-01-21 ENCOUNTER — Inpatient Hospital Stay: Payer: Medicare PPO | Attending: Internal Medicine

## 2023-01-21 ENCOUNTER — Encounter: Payer: Self-pay | Admitting: Internal Medicine

## 2023-01-21 ENCOUNTER — Inpatient Hospital Stay: Payer: Medicare PPO

## 2023-01-21 VITALS — BP 116/56 | HR 73 | Temp 97.3°F | Ht 61.0 in | Wt 108.0 lb

## 2023-01-21 DIAGNOSIS — N184 Chronic kidney disease, stage 4 (severe): Secondary | ICD-10-CM | POA: Diagnosis not present

## 2023-01-21 DIAGNOSIS — Z860101 Personal history of adenomatous and serrated colon polyps: Secondary | ICD-10-CM | POA: Diagnosis not present

## 2023-01-21 DIAGNOSIS — J45909 Unspecified asthma, uncomplicated: Secondary | ICD-10-CM | POA: Insufficient documentation

## 2023-01-21 DIAGNOSIS — I2510 Atherosclerotic heart disease of native coronary artery without angina pectoris: Secondary | ICD-10-CM | POA: Insufficient documentation

## 2023-01-21 DIAGNOSIS — C3431 Malignant neoplasm of lower lobe, right bronchus or lung: Secondary | ICD-10-CM

## 2023-01-21 DIAGNOSIS — Z79899 Other long term (current) drug therapy: Secondary | ICD-10-CM | POA: Insufficient documentation

## 2023-01-21 DIAGNOSIS — E538 Deficiency of other specified B group vitamins: Secondary | ICD-10-CM | POA: Diagnosis not present

## 2023-01-21 DIAGNOSIS — Z952 Presence of prosthetic heart valve: Secondary | ICD-10-CM | POA: Insufficient documentation

## 2023-01-21 DIAGNOSIS — I4891 Unspecified atrial fibrillation: Secondary | ICD-10-CM | POA: Insufficient documentation

## 2023-01-21 DIAGNOSIS — R569 Unspecified convulsions: Secondary | ICD-10-CM | POA: Diagnosis not present

## 2023-01-21 DIAGNOSIS — Z85828 Personal history of other malignant neoplasm of skin: Secondary | ICD-10-CM | POA: Diagnosis not present

## 2023-01-21 DIAGNOSIS — D693 Immune thrombocytopenic purpura: Secondary | ICD-10-CM | POA: Insufficient documentation

## 2023-01-21 DIAGNOSIS — Z7901 Long term (current) use of anticoagulants: Secondary | ICD-10-CM | POA: Insufficient documentation

## 2023-01-21 DIAGNOSIS — J90 Pleural effusion, not elsewhere classified: Secondary | ICD-10-CM | POA: Diagnosis not present

## 2023-01-21 DIAGNOSIS — Z8673 Personal history of transient ischemic attack (TIA), and cerebral infarction without residual deficits: Secondary | ICD-10-CM | POA: Diagnosis not present

## 2023-01-21 DIAGNOSIS — K219 Gastro-esophageal reflux disease without esophagitis: Secondary | ICD-10-CM | POA: Insufficient documentation

## 2023-01-21 DIAGNOSIS — E1122 Type 2 diabetes mellitus with diabetic chronic kidney disease: Secondary | ICD-10-CM | POA: Diagnosis not present

## 2023-01-21 DIAGNOSIS — Z85038 Personal history of other malignant neoplasm of large intestine: Secondary | ICD-10-CM | POA: Diagnosis not present

## 2023-01-21 DIAGNOSIS — D649 Anemia, unspecified: Secondary | ICD-10-CM | POA: Insufficient documentation

## 2023-01-21 DIAGNOSIS — Z8719 Personal history of other diseases of the digestive system: Secondary | ICD-10-CM | POA: Insufficient documentation

## 2023-01-21 DIAGNOSIS — E1151 Type 2 diabetes mellitus with diabetic peripheral angiopathy without gangrene: Secondary | ICD-10-CM | POA: Diagnosis not present

## 2023-01-21 LAB — CBC WITH DIFFERENTIAL (CANCER CENTER ONLY)
Abs Immature Granulocytes: 0.01 10*3/uL (ref 0.00–0.07)
Basophils Absolute: 0 10*3/uL (ref 0.0–0.1)
Basophils Relative: 1 %
Eosinophils Absolute: 0.1 10*3/uL (ref 0.0–0.5)
Eosinophils Relative: 1 %
HCT: 35.1 % — ABNORMAL LOW (ref 36.0–46.0)
Hemoglobin: 11.5 g/dL — ABNORMAL LOW (ref 12.0–15.0)
Immature Granulocytes: 0 %
Lymphocytes Relative: 18 %
Lymphs Abs: 1 10*3/uL (ref 0.7–4.0)
MCH: 30.4 pg (ref 26.0–34.0)
MCHC: 32.8 g/dL (ref 30.0–36.0)
MCV: 92.9 fL (ref 80.0–100.0)
Monocytes Absolute: 0.4 10*3/uL (ref 0.1–1.0)
Monocytes Relative: 7 %
Neutro Abs: 3.8 10*3/uL (ref 1.7–7.7)
Neutrophils Relative %: 73 %
Platelet Count: 132 10*3/uL — ABNORMAL LOW (ref 150–400)
RBC: 3.78 MIL/uL — ABNORMAL LOW (ref 3.87–5.11)
RDW: 12.7 % (ref 11.5–15.5)
WBC Count: 5.3 10*3/uL (ref 4.0–10.5)
nRBC: 0 % (ref 0.0–0.2)

## 2023-01-21 LAB — CMP (CANCER CENTER ONLY)
ALT: 12 U/L (ref 0–44)
AST: 20 U/L (ref 15–41)
Albumin: 4.1 g/dL (ref 3.5–5.0)
Alkaline Phosphatase: 84 U/L (ref 38–126)
Anion gap: 8 (ref 5–15)
BUN: 31 mg/dL — ABNORMAL HIGH (ref 8–23)
CO2: 25 mmol/L (ref 22–32)
Calcium: 9 mg/dL (ref 8.9–10.3)
Chloride: 105 mmol/L (ref 98–111)
Creatinine: 1.5 mg/dL — ABNORMAL HIGH (ref 0.44–1.00)
GFR, Estimated: 34 mL/min — ABNORMAL LOW (ref >60)
Glucose, Bld: 98 mg/dL (ref 70–99)
Potassium: 4.2 mmol/L (ref 3.5–5.1)
Sodium: 138 mmol/L (ref 135–145)
Total Bilirubin: 0.5 mg/dL (ref 0.3–1.2)
Total Protein: 6.9 g/dL (ref 6.5–8.1)

## 2023-01-21 LAB — LACTATE DEHYDROGENASE: LDH: 177 U/L (ref 98–192)

## 2023-01-21 NOTE — Progress Notes (Signed)
[facility] OFFICE PROGRESS NOTE  Patient Care Team: Lynnea Ferrier, MD as PCP - General (Internal Medicine) Lanier Prude, MD as PCP - Electrophysiology (Cardiology) Earna Coder, MD as Consulting Physician (Internal Medicine) Stanton Kidney, MD as Consulting Physician (Gastroenterology) Lanier Prude, MD as Consulting Physician (Cardiology)   Cancer Staging  Primary malignant neoplasm of right lower lobe of lung [facility]) Staging form: Lung, AJCC 7th Edition - Clinical: No stage assigned - Unsigned    Oncology History Overview Note  # DEC 2017- Adeno ca [GATA; her 2 Neu-NEG]; signet ring [1.5 x3 mm gastric incisura; Dr.Skulskie]; EUS [Dr.Burnbridge]; no significant abnormality noted;  Reviewed at [facility] also. JAN 2018- PET NED. April 2018- S/p partial gastrectomy [Dr.Sankar]- STAGE I ADENO CA; NO adjuvant therapy.  # STAGE I CARCINOID s/p partial gastrectomy   # May 2018- Chronic Atrophic gastritis- Prevpac   # FEB 2017- ADENOCARCINOMA with Lepidic 80%-20% acinar pattern; pT2a [Stage IB;T-2.3cm; visceral pleural invasion present; pN=0 ]; AUG 2017- CT NED;   # DEC 2019- RECURRENT/STAGE IV ADENO LUNG CA- EGFR MUTATED;# Jan 6th 2020-; START Osidemrtinib;  # AUG 2020- SEVERE AS [s/p TAVR; [place]]-complicated by R sided stroke/ seizures/acute renal failure.   # DEC 2nd 2020- START PROMACTA 25 mg/day [ITP]  # Molecular testing: EFGR mutated [id number] [omniseq]  # Palliative: O-1/20   DIAGNOSIS:  #ADENO CA LUNG-STAGE IV #Stomach adeno ca [stage I; dec 2017]  GOALS: pallaitive  CURRENT/MOST RECENT THERAPY - OSIMERTINIB [Jan 6th 2020]      Primary malignant neoplasm of right lower lobe of lung (HCC)  12/02/2015 Initial Diagnosis   Primary malignant neoplasm of right lower lobe of lung (HCC)   Adenocarcinoma of gastric cardia (HCC)   INTERVAL HISTORY: Alone.  Ambulating independantly.   Stacy Cook 87 y.o.  female pleasant patient above  history of recurrent/stage IV adenocarcinoma lung/EGFR mutated currently on osimertinib; ITP on promacta; A.fib on eliquis is here for follow-up.   Patient has mild Chronic fatigue; not any worse. sure 1/day, appetite 75% normal.   He is asking if her nose running like water and scalp itching for last 3-4 months.   Patient had one episode of Blood in stool- currently resolved.  Currently on eliquis 2.5 mg BID by cardiology. No hemoptysis.  Denies any worsening swelling of the legs.  Patient states that she is compliant with her oral iron.  No constipation.  Review of Systems  Constitutional:  Positive for malaise/fatigue. Negative for chills, diaphoresis and fever.  HENT:  Negative for nosebleeds and sore throat.   Eyes:  Negative for double vision.  Respiratory:  Negative for hemoptysis, sputum production and wheezing.   Cardiovascular:  Negative for chest pain, palpitations, orthopnea and leg swelling.  Gastrointestinal:  Negative for blood in stool, constipation, melena, nausea and vomiting.  Genitourinary:  Negative for dysuria, frequency and urgency.  Musculoskeletal:  Positive for back pain and joint pain.  Neurological:  Positive for focal weakness. Negative for dizziness, tingling, weakness and headaches.  Endo/Heme/Allergies:  Bruises/bleeds easily.  Psychiatric/Behavioral:  Negative for depression. The patient is not nervous/anxious and does not have insomnia.       PAST MEDICAL HISTORY :  Past Medical History:  Diagnosis Date   Abdominal pain    Acid reflux 03/24/2015   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   Allergic genetic state    Anemia    Arteriosclerosis of coronary artery 03/24/2015   Arthritis    "back,  hands" (10/15/2017)   Asthma    B12 deficiency anemia    Basal cell carcinoma 01/01/2022   glabella, [facility]   Basal cell carcinoma 05/03/2022   R ant deltoid, [facility]   Basal cell carcinoma 05/03/2022   Xyphoid excised 06/12/2022   Bile reflux gastritis     CAD (coronary artery disease)    Cancer of right lung (HCC) 05/09/2015   Dr. Thelma Barge performed Right lower lobe lobectomy.    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 08/03/2016   Carotid stenosis 02/07/2016   Cataract cortical, senile    Chest pain    Colon polyp    Degeneration of intervertebral disc of lumbar region 03/11/2014   Diabetes (HCC)    Gastric adenocarcinoma (HCC)    GERD (gastroesophageal reflux disease)    also, history of ulcers   History of kidney stones    History of stroke    HLD (hyperlipidemia) 08/24/2013   Hypertension    Malignant tumor of stomach (HCC) 07/2016   Adenocarcinoma, diffuse, poorly differentiated, signet ring, stage I   Neuritis or radiculitis due to rupture of lumbar intervertebral disc 09/10/2014   Neuroendocrine tumor 03/24/2015   Neuroma    Osteoporosis    Peripheral vascular disease (HCC)    PONV (postoperative nausea and vomiting)    Presence of permanent cardiac pacemaker 12/09/2018   Primary malignant neoplasm of right lower lobe of lung (HCC) 12/02/2015   Renal insufficiency    Renal stone    S/P TAVR (transcatheter aortic valve replacement)    Sciatica    Severe aortic stenosis    Skin cancer    "cut/burned LUE; cut off right eye/nose & cut off chest" (10/15/2017)   Stomach ulcer    Thrombocytopenia (HCC)    Type 2 diabetes, diet controlled (HCC)    "no RX since stomach OR 07/2016" (10/15/2017)    PAST SURGICAL HISTORY :   Past Surgical History:  Procedure Laterality Date   APPENDECTOMY     CARDIAC CATHETERIZATION  X2 before 10/15/2017   CARDIAC VALVE REPLACEMENT     CATARACT EXTRACTION W/PHACO Left 04/04/2017   Procedure: CATARACT EXTRACTION PHACO AND INTRAOCULAR LENS PLACEMENT (IOC);  Surgeon: Lockie Mola, MD;  Location: [facility] ORS;  Service: Ophthalmology;  Laterality: Left;  Lot # [id number] H [place] 1:00 Ap 25% CDE 8.54   CATARACT EXTRACTION W/PHACO Right 05/15/2017   Procedure: CATARACT EXTRACTION PHACO AND INTRAOCULAR LENS  PLACEMENT (IOC);  Surgeon: Lockie Mola, MD;  Location: [facility] ORS;  Service: Ophthalmology;  Laterality: Right;  [place] 01:10 AP% 18.3 CDE 12.91 Fluid pack lot # [id number]   COLONOSCOPY     CORONARY ATHERECTOMY N/A 10/15/2017   Procedure: CORONARY ATHERECTOMY;  Surgeon: Kathleene Hazel, MD;  [facility];  Service: Cardiovascular;  Laterality: N/A;   CORONARY STENT INTERVENTION N/A 10/15/2017   Procedure: CORONARY STENT INTERVENTION;  Surgeon: Kathleene Hazel, MD;  Location: [facility];  Service: Cardiovascular;  Laterality: N/A;   CYSTOSCOPY/URETEROSCOPY/HOLMIUM LASER/STENT PLACEMENT Left 08/03/2019   Procedure: CYSTOSCOPY/URETEROSCOPY/HOLMIUM LASER/STENT PLACEMENT;  Surgeon: Vanna Scotland, MD;  Location: [facility] ORS;  Service: Urology;  Laterality: Left;   ESOPHAGOGASTRODUODENOSCOPY     ESOPHAGOGASTRODUODENOSCOPY (EGD) WITH PROPOFOL N/A 03/27/2016   Procedure: ESOPHAGOGASTRODUODENOSCOPY (EGD) WITH PROPOFOL;  Surgeon: Christena Deem, MD;  Location: [facility] ENDOSCOPY;  Service: Endoscopy;  Laterality: N/A;   ESOPHAGOGASTRODUODENOSCOPY (EGD) WITH PROPOFOL N/A 05/28/2016   Procedure: ESOPHAGOGASTRODUODENOSCOPY (EGD) WITH PROPOFOL;  Surgeon: Christena Deem, MD;  Location: [facility] ENDOSCOPY;  Service:  Endoscopy;  Laterality: N/A;   ESOPHAGOGASTRODUODENOSCOPY (EGD) WITH PROPOFOL N/A 09/25/2016   Procedure: ESOPHAGOGASTRODUODENOSCOPY (EGD) WITH PROPOFOL;  Surgeon: Kieth Brightly, MD;  Location: [facility] ENDOSCOPY;  Service: Endoscopy;  Laterality: N/A;   ESOPHAGOGASTRODUODENOSCOPY (EGD) WITH PROPOFOL N/A 12/18/2016   Procedure: ESOPHAGOGASTRODUODENOSCOPY (EGD) WITH PROPOFOL;  Surgeon: Kieth Brightly, MD;  Location: [facility] ENDOSCOPY;  Service: Endoscopy;  Laterality: N/A;   ESOPHAGOGASTRODUODENOSCOPY (EGD) WITH PROPOFOL N/A 01/23/2017   Procedure: ESOPHAGOGASTRODUODENOSCOPY (EGD) WITH PROPOFOL;  Surgeon: Kieth Brightly, MD;  Location: [facility]  ENDOSCOPY;  Service: Endoscopy;  Laterality: N/A;   ESOPHAGOGASTRODUODENOSCOPY (EGD) WITH PROPOFOL N/A 03/20/2017   Procedure: ESOPHAGOGASTRODUODENOSCOPY (EGD) WITH PROPOFOL;  Surgeon: Kieth Brightly, MD;  Location: [facility] ENDOSCOPY;  Service: Endoscopy;  Laterality: N/A;   ESOPHAGOGASTRODUODENOSCOPY (EGD) WITH PROPOFOL N/A 09/23/2020   Procedure: ESOPHAGOGASTRODUODENOSCOPY (EGD) WITH PROPOFOL;  Surgeon: Regis Bill, MD;  Location: [facility] ENDOSCOPY;  Service: Endoscopy;  Laterality: N/A;  [facility] SAYS "AMPICILLIN IS NEEDED"   FRACTURE SURGERY     INSERT / REPLACE / REMOVE PACEMAKER  12/09/2018   PACEMAKER LEADLESS INSERTION N/A 12/09/2018   Procedure: PACEMAKER LEADLESS INSERTION;  Surgeon: Hillis Range, MD;  [facility];  Service: Cardiovascular;  Laterality: N/A;   PARTIAL GASTRECTOMY N/A 08/03/2016   Hemigastrectomy, Billroth I reconstruction Surgeon: Kieth Brightly, MD;  Location: [facility] ORS;  Service: General;  Laterality: N/A;   RIGHT/LEFT HEART CATH AND CORONARY ANGIOGRAPHY Bilateral 09/19/2017   Procedure: RIGHT/LEFT HEART CATH AND CORONARY ANGIOGRAPHY;  Surgeon: Alwyn Pea, MD;  Location: [facility] INVASIVE CV LAB;  Service: Cardiovascular;  Laterality: Bilateral;   SHOULDER ARTHROSCOPY W/ CAPSULAR REPAIR Right    SKIN CANCER EXCISION     "cut/burned LUE; cut off right eye/nose & cut off chest" (10/15/2017)   TEE WITHOUT CARDIOVERSION N/A 12/02/2018   Procedure: TRANSESOPHAGEAL ECHOCARDIOGRAM (TEE);  Surgeon: Kathleene Hazel, MD;  Location: [facility] INVASIVE CV LAB;  Service: Open Heart Surgery;  Laterality: N/A;   THORACOTOMY Right 05/09/2015   Procedure: THORACOTOMY, RIGHT LOWER LOBECTOMY, BRONCHOSCOPY;  Surgeon: Hulda Marin, MD;  Location: [facility] ORS;  Service: Thoracic;  Laterality: Right;   TONSILLECTOMY  1944   TRANSCATHETER AORTIC VALVE REPLACEMENT, TRANSFEMORAL N/A 12/02/2018   Procedure: TRANSCATHETER AORTIC VALVE REPLACEMENT, TRANSFEMORAL;   Surgeon: Kathleene Hazel, MD;  [facility];  Service: Open Heart Surgery;  Laterality: N/A;   TUBAL LIGATION     UPPER GI ENDOSCOPY N/A 08/03/2016   Procedure: UPPER  ENDOSCOPY;  Surgeon: Kieth Brightly, MD;  Location: [facility] ORS;  Service: General;  Laterality: N/A;   VAGINAL HYSTERECTOMY     WRIST FRACTURE SURGERY Right     FAMILY HISTORY :   Family History  Problem Relation Age of Onset   Aortic aneurysm Mother    Kidney Stones Mother    Hypertension Mother    Hypertension Father    Diabetes Other    Breast cancer Neg Hx    Prostate cancer Neg Hx    Bladder Cancer Neg Hx    Kidney cancer Neg Hx     SOCIAL HISTORY:   Social History   Tobacco Use   Smoking status: Never   Smokeless tobacco: Never  Vaping Use   Vaping status: Never Used  Substance Use Topics   Alcohol use: Not Currently   Drug use: Never    ALLERGIES:  is allergic to mirtazapine, pneumococcal vaccine, statins, and sulfa antibiotics.  MEDICATIONS:  Current Outpatient Medications  Medication Sig Dispense  Refill   apixaban (ELIQUIS) 2.5 MG TABS tablet Take 1 tablet by mouth twice daily 60 tablet 5   cholecalciferol (VITAMIN D) 1000 UNITS tablet Take 1,000 Units by mouth daily.     cyanocobalamin 1000 MCG tablet Take 1,000 mcg by mouth daily.      eltrombopag (PROMACTA) 25 MG tablet Take 1 tablet (25 mg total) by mouth daily. Take on an empty stomach, 1 hour before a meal or 2 hours after. 30 tablet 7   ezetimibe (ZETIA) 10 MG tablet Take 10 mg by mouth daily.     ferrous sulfate 325 (65 FE) MG tablet Take 325 mg by mouth daily with breakfast.     isosorbide mononitrate (IMDUR) 30 MG 24 hr tablet Take 1 tablet by mouth once daily 90 tablet 3   Magnesium 100 MG TABS Take by mouth.     metoprolol succinate (TOPROL-XL) 50 MG 24 hr tablet Take 1 tablet (50 mg total) by mouth every evening. Take with or immediately following a meal. 30 tablet 0   osimertinib mesylate (TAGRISSO) 80  MG tablet Take 1 tablet (80 mg total) by mouth daily. 30 tablet 11   potassium citrate (UROCIT-K) 10 MEQ (1080 MG) SR tablet Take 1 tablet by mouth once daily 90 tablet 0   No current facility-administered medications for this visit.    PHYSICAL EXAMINATION: ECOG PERFORMANCE STATUS: 0 - Asymptomatic  BP (!) 116/56 (BP Location: Left Arm, Patient Position: Sitting, Cuff Size: Normal)   Pulse 73   Temp (!) 97.3 F (36.3 C) (Tympanic)   Ht 5\' 1"  (1.549 m)   Wt 108 lb (49 kg)   SpO2 99%   BMI 20.41 kg/m   Filed Weights   01/21/23 1340  Weight: 108 lb (49 kg)    Decreased breath sounds on the right side compared to left.  Physical Exam Constitutional:      Comments: Patient walking by herself; no assistive devices.  Alone.  HENT:     Head: Normocephalic and atraumatic.     Mouth/Throat:     Pharynx: No oropharyngeal exudate.  Eyes:     Pupils: Pupils are equal, round, and reactive to light.  Cardiovascular:     Rate and Rhythm: Normal rate and regular rhythm.     Heart sounds: Murmur heard.  Pulmonary:     Effort: No respiratory distress.     Breath sounds: No wheezing.  Abdominal:     General: Bowel sounds are normal. There is no distension.     Palpations: Abdomen is soft. There is no mass.     Tenderness: There is no abdominal tenderness. There is no guarding or rebound.  Musculoskeletal:        General: No tenderness. Normal range of motion.     Cervical back: Normal range of motion and neck supple.  Skin:    General: Skin is warm.  Neurological:     Mental Status: She is alert and oriented to person, place, and time.     Comments: Weakness of the right upper extremity more than lower extremity.  Psychiatric:        Mood and Affect: Affect normal.    LABORATORY DATA:  I have reviewed the data as listed    Component Value Date/Time   NA 138 01/21/2023 1326   NA 141 02/05/2022 1405   NA 141 07/21/2012 1529   K 4.2 01/21/2023 1326   K 3.9 07/21/2012 1529    CL 105 01/21/2023 1326  CL 109 (H) 07/21/2012 1529   CO2 25 01/21/2023 1326   CO2 27 07/21/2012 1529   GLUCOSE 98 01/21/2023 1326   GLUCOSE 104 (H) 07/21/2012 1529   BUN 31 (H) 01/21/2023 1326   BUN 34 (H) 02/05/2022 1405   BUN 16 07/21/2012 1529   CREATININE 1.50 (H) 01/21/2023 1326   CREATININE 1.11 09/03/2012 1535   CALCIUM 9.0 01/21/2023 1326   CALCIUM 8.9 07/21/2012 1529   PROT 6.9 01/21/2023 1326   PROT 7.1 07/21/2012 1529   ALBUMIN 4.1 01/21/2023 1326   ALBUMIN 3.9 07/21/2012 1529   AST 20 01/21/2023 1326   ALT 12 01/21/2023 1326   ALT 22 07/21/2012 1529   ALKPHOS 84 01/21/2023 1326   ALKPHOS 76 07/21/2012 1529   BILITOT 0.5 01/21/2023 1326   GFRNONAA 34 (L) 01/21/2023 1326   GFRNONAA 49 (L) 09/03/2012 1535   GFRAA 29 (L) 01/05/2020 1440   GFRAA 57 (L) 09/03/2012 1535    No results found for: "SPEP", "UPEP"  Lab Results  Component Value Date   WBC 5.3 01/21/2023   NEUTROABS 3.8 01/21/2023   HGB 11.5 (L) 01/21/2023   HCT 35.1 (L) 01/21/2023   MCV 92.9 01/21/2023   PLT 132 (L) 01/21/2023      Chemistry      Component Value Date/Time   NA 138 01/21/2023 1326   NA 141 02/05/2022 1405   NA 141 07/21/2012 1529   K 4.2 01/21/2023 1326   K 3.9 07/21/2012 1529   CL 105 01/21/2023 1326   CL 109 (H) 07/21/2012 1529   CO2 25 01/21/2023 1326   CO2 27 07/21/2012 1529   BUN 31 (H) 01/21/2023 1326   BUN 34 (H) 02/05/2022 1405   BUN 16 07/21/2012 1529   CREATININE 1.50 (H) 01/21/2023 1326   CREATININE 1.11 09/03/2012 1535      Component Value Date/Time   CALCIUM 9.0 01/21/2023 1326   CALCIUM 8.9 07/21/2012 1529   ALKPHOS 84 01/21/2023 1326   ALKPHOS 76 07/21/2012 1529   AST 20 01/21/2023 1326   ALT 12 01/21/2023 1326   ALT 22 07/21/2012 1529   BILITOT 0.5 01/21/2023 1326       RADIOGRAPHIC STUDIES: I have personally reviewed the radiological images as listed and agreed with the findings in the report. No results found.   ASSESSMENT & PLAN:   Primary malignant neoplasm of right lower lobe of lung (HCC) # Stage IV recurrent adenocarcinoma the lung; EGFR mutated. On osimertinib 80 mg [Feb 6th 2020]. November 07, 2022- Status post right lower lobectomy. Similar loculated, thick-walled right-sided pleural fluid collection with resolution of pleural air. Similar adjacent collapse/consolidation within the right lung base. No convincing evidence of recurrent or metastatic disease. No acute process or evidence of metastatic disease in the abdomen/pelvis.   # currently on Osimertinib 80 mg/day; tolerating well. See above/ Continue current therapy-no obvious evidence of recurrent malignancy at this time. Thoracentesis negative any malignant cells- STABLE.   # Blood in stool- 1 episode [on eliquis]- resolved [2 weeks ago]; caution while on Eliquis.  No recent colonoscopies.  If worse recommend-calling GI regarding further recommendations- STABLE.   #Right pleural effusion-status postthoracentesis-pleural fluid: Transudative; cytology negative for malignancy.  Question CHF/fluid overload.  S/p Lasix 20 mg a day- improved/stable. Currently not on any lasix [ARF]. STABLE.   # Chronic anemia- Hb 10-18 Dec 2021- Iron sat- 22%; ferritin- 137- continue Iron pills once day. Hold off Venofer-STABLE.   # ITP- chronic- platelets> 100 on promacta  25 mg/day-STABLE.   # Stomach cancer stage I status post gastrectomy; June 2022- S/p EGD- [facility].STABLE.   # A-fib [feb 2024] on Eliquis 2.5/cardiology. STABLE.   # right sided stroke/ seizures-on asprin; off plavix;; f/u neurology-[place]- STABLE.   # CKD- stage IV; Left Hydronephrosis s/p ureteral stent placement/nephrolithiasis-~ GFR 34 today; Hypokalemia-3.4  continue K-citrate q day. STABLE.   # IV ACCESS: PIV  # DISPOSITION: # no venofer today # follow up in 3  months- MD: labs- cbc/cmp;LDH; possible IV venofer;   Dr.B        Orders Placed This Encounter  Procedures   CBC with Differential (Cancer  Center Only)    Standing Status:   Future    Standing Expiration Date:   01/21/2024   CMP (Cancer Center only)    Standing Status:   Future    Standing Expiration Date:   01/21/2024   Lactate dehydrogenase    Standing Status:   Future    Standing Expiration Date:   01/21/2024   All questions were answered. The patient knows to call the clinic with any problems, questions or concerns.      Earna Coder, MD 01/21/2023 3:01 PM

## 2023-01-21 NOTE — Progress Notes (Signed)
Ensure 1/day, appetite 75% normal.  He is asking if her nose running like water and scalp itching is related to her lung cancer? She saw this on google.

## 2023-01-21 NOTE — Assessment & Plan Note (Addendum)
#   Stage IV recurrent adenocarcinoma the lung; EGFR mutated. On osimertinib 80 mg [Feb 6th 2020]. November 07, 2022- Status post right lower lobectomy. Similar loculated, thick-walled right-sided pleural fluid collection with resolution of pleural air. Similar adjacent collapse/consolidation within the right lung base. No convincing evidence of recurrent or metastatic disease. No acute process or evidence of metastatic disease in the abdomen/pelvis.   # currently on Osimertinib 80 mg/day; tolerating well. See above/ Continue current therapy-no obvious evidence of recurrent malignancy at this time. Thoracentesis negative any malignant cells- STABLE.   # Blood in stool- 1 episode [on eliquis]- resolved [2 weeks ago]; caution while on Eliquis.  No recent colonoscopies.  If worse recommend-calling GI regarding further recommendations- STABLE.   #Right pleural effusion-status postthoracentesis-pleural fluid: Transudative; cytology negative for malignancy.  Question CHF/fluid overload.  S/p Lasix 20 mg a day- improved/stable. Currently not on any lasix [ARF]. STABLE.   # Chronic anemia- Hb 10-18 Dec 2021- Iron sat- 22%; ferritin- 137- continue Iron pills once day. Hold off Venofer-STABLE.   # ITP- chronic- platelets> 100 on promacta 25 mg/day-STABLE.   # Stomach cancer stage I status post gastrectomy; June 2022- S/p EGD- [facility].STABLE.   # A-fib [feb 2024] on Eliquis 2.5/cardiology. STABLE.   # right sided stroke/ seizures-on asprin; off plavix;; f/u neurology-[place]- STABLE.   # CKD- stage IV; Left Hydronephrosis s/p ureteral stent placement/nephrolithiasis-~ GFR 34 today; Hypokalemia-3.4  continue K-citrate q day. STABLE.   # IV ACCESS: PIV  # DISPOSITION: # no venofer today # follow up in 3  months- MD: labs- cbc/cmp;LDH; possible IV venofer;   Dr.B

## 2023-01-31 ENCOUNTER — Other Ambulatory Visit: Payer: Self-pay

## 2023-02-05 ENCOUNTER — Ambulatory Visit (HOSPITAL_COMMUNITY): Payer: Medicare PPO | Attending: Cardiology

## 2023-02-05 DIAGNOSIS — I340 Nonrheumatic mitral (valve) insufficiency: Secondary | ICD-10-CM | POA: Insufficient documentation

## 2023-02-05 DIAGNOSIS — Z952 Presence of prosthetic heart valve: Secondary | ICD-10-CM | POA: Insufficient documentation

## 2023-02-05 DIAGNOSIS — I5033 Acute on chronic diastolic (congestive) heart failure: Secondary | ICD-10-CM | POA: Diagnosis present

## 2023-02-05 LAB — ECHOCARDIOGRAM COMPLETE
AV Mean grad: 9.4 mm[Hg]
AV Peak grad: 16.3 mm[Hg]
Ao pk vel: 2.02 m/s
Area-P 1/2: 3.08 cm2
S' Lateral: 3 cm

## 2023-02-06 ENCOUNTER — Telehealth (HOSPITAL_BASED_OUTPATIENT_CLINIC_OR_DEPARTMENT_OTHER): Payer: Self-pay

## 2023-02-06 ENCOUNTER — Other Ambulatory Visit (HOSPITAL_COMMUNITY): Payer: Self-pay

## 2023-02-06 ENCOUNTER — Encounter: Payer: Self-pay | Admitting: Internal Medicine

## 2023-02-06 NOTE — Telephone Encounter (Addendum)
Called patient and reviewed results to her. She verbalized understanding.   ----- Message from Nurse Corky Crafts sent at 02/05/2023  6:18 PM EDT ----- Former Dr. Shari Prows pt but now is established with Dr. Cristal Deer and Jari Favre PA-C.  Please result and review with the pt.   Thanks, Lajoyce Corners ----- Message ----- From: Interface, [employer] Sent: 02/05/2023   6:14 PM EDT To: [address] Triage

## 2023-02-12 ENCOUNTER — Telehealth: Payer: Self-pay

## 2023-02-12 NOTE — Telephone Encounter (Signed)
Called and spoke with patient regarding re-enrollment application. Patient would like to stop by MD Office and sign application in person by end of week (02/15/23). I will meet patient in office to obtain signatures and will submit once in hand. I will continue to follow and update until final determination.    Ardeen Fillers, CPhT Oncology Pharmacy Patient Advocate  [facility]  [phone] (phone) [phone] (fax) 02/12/2023 9:55 AM

## 2023-02-12 NOTE — Telephone Encounter (Signed)
Oral Oncology Patient Advocate Encounter   Received notification that patient is due for re-enrollment for assistance for Promacta through [employer].   Re-enrollment process has been initiated and will be submitted upon completion of necessary documents.  NPA[employer]'s phone number [phone].   I will continue to follow until final determination.   Ardeen Fillers, CPhT Oncology Pharmacy Patient Advocate  [facility]  [phone] (phone) [phone] (fax) 02/12/2023 9:54 AM

## 2023-02-19 NOTE — Telephone Encounter (Signed)
Oral Oncology Patient Advocate Encounter   Submitted application for assistance for Promacta to [employer].   Application submitted via e-fax to [phone]   NPAF's phone number [phone].   I will continue to check the status until final determination.    Ardeen Fillers, CPhT Oncology Pharmacy Patient Advocate  [facility]  [phone] (phone) [phone] (fax) 02/19/2023 8:44 AM

## 2023-02-20 ENCOUNTER — Ambulatory Visit: Payer: Medicare PPO | Admitting: Dermatology

## 2023-02-20 DIAGNOSIS — W908XXA Exposure to other nonionizing radiation, initial encounter: Secondary | ICD-10-CM | POA: Diagnosis not present

## 2023-02-20 DIAGNOSIS — L820 Inflamed seborrheic keratosis: Secondary | ICD-10-CM

## 2023-02-20 DIAGNOSIS — L578 Other skin changes due to chronic exposure to nonionizing radiation: Secondary | ICD-10-CM | POA: Diagnosis not present

## 2023-02-20 NOTE — Progress Notes (Signed)
   Follow-Up Visit   Subjective  Stacy Cook is a 87 y.o. female who presents for the following: 4 month isk follow up Hx of isk at right anterior vertex scalp The patient has spots, moles and lesions to be evaluated, some may be new or changing and the patient may have concern these could be cancer.   The following portions of the chart were reviewed this encounter and updated as appropriate: medications, allergies, medical history  Review of Systems:  No other skin or systemic complaints except as noted in HPI or Assessment and Plan.  Objective  Well appearing patient in no apparent distress; mood and affect are within normal limits.   A focused examination was performed of the following areas: scalp  Relevant exam findings are noted in the Assessment and Plan.  right anterior vertex scalp x 1 Erythematous stuck-on, waxy papule or plaque    Assessment & Plan    ACTINIC DAMAGE - chronic, secondary to cumulative UV radiation exposure/sun exposure over time - diffuse scaly erythematous macules with underlying dyspigmentation - Recommend daily broad spectrum sunscreen SPF 30+ to sun-exposed areas, reapply every 2 hours as needed.  - Recommend staying in the shade or wearing long sleeves, sun glasses (UVA+UVB protection) and wide brim hats (4-inch brim around the entire circumference of the hat). - Call for new or changing lesions.  Inflamed seborrheic keratosis right anterior vertex scalp x 1  Symptomatic, irritating, patient would like treated.  Residual   Destruction of lesion - right anterior vertex scalp x 1 Complexity: simple   Destruction method: cryotherapy   Informed consent: discussed and consent obtained   Timeout:  patient name, date of birth, surgical site, and procedure verified Lesion destroyed using liquid nitrogen: Yes   Region frozen until ice ball extended beyond lesion: Yes   Outcome: patient tolerated procedure well with no complications    Post-procedure details: wound care instructions given      Return for 6 month tbse .  IAsher Muir, CMA, am acting as scribe for Armida Sans, MD.   Documentation: I have reviewed the above documentation for accuracy and completeness, and I agree with the above.  Armida Sans, MD

## 2023-02-20 NOTE — Patient Instructions (Addendum)
 Seborrheic Keratosis  What causes seborrheic keratoses? Seborrheic keratoses are harmless, common skin growths that first appear during adult life.  As time goes by, more growths appear.  Some people may develop a large number of them.  Seborrheic keratoses appear on both covered and uncovered body parts.  They are not caused by sunlight.  The tendency to develop seborrheic keratoses can be inherited.  They vary in color from skin-colored to gray, brown, or even black.  They can be either smooth or have a rough, warty surface.   Seborrheic keratoses are superficial and look as if they were stuck on the skin.  Under the microscope this type of keratosis looks like layers upon layers of skin.  That is why at times the top layer may seem to fall off, but the rest of the growth remains and re-grows.    Treatment Seborrheic keratoses do not need to be treated, but can easily be removed in the office.  Seborrheic keratoses often cause symptoms when they rub on clothing or jewelry.  Lesions can be in the way of shaving.  If they become inflamed, they can cause itching, soreness, or burning.  Removal of a seborrheic keratosis can be accomplished by freezing, burning, or surgery. If any spot bleeds, scabs, or grows rapidly, please return to have it checked, as these can be an indication of a skin cancer.  Cryotherapy Aftercare  Wash gently with soap and water everyday.   Apply Vaseline and Band-Aid daily until healed.   Due to recent changes in healthcare laws, you may see results of your pathology and/or laboratory studies on MyChart before the doctors have had a chance to review them. We understand that in some cases there may be results that are confusing or concerning to you. Please understand that not all results are received at the same time and often the doctors may need to interpret multiple results in order to provide you with the best plan of care or course of treatment. Therefore, we ask that you  please give [place] 2 business days to thoroughly review all your results before contacting the office for clarification. Should we see a critical lab result, you will be contacted sooner.   If You Need Anything After Your Visit  If you have any questions or concerns for your doctor, please call our main line at [phone] and press option 4 to reach your doctor's medical assistant. If no one answers, please leave a voicemail as directed and we will return your call as soon as possible. Messages left after 4 pm will be answered the following business day.   You may also send [place] a message via MyChart. We typically respond to MyChart messages within 1-2 business days.  For prescription refills, please ask your pharmacy to contact our office. Our fax number is [phone].  If you have an urgent issue when the clinic is closed that cannot wait until the next business day, you can page your doctor at the number below.    Please note that while we do our best to be available for urgent issues outside of office hours, we are not available 24/7.   If you have an urgent issue and are unable to reach [place], you may choose to seek medical care at your doctor's office, retail clinic, urgent care center, or emergency room.  If you have a medical emergency, please immediately call 911 or go to the emergency department.  Pager Numbers  - Dr. Gwen Pounds: [phone]  -  Dr. Roseanne Reno: [phone]  - Dr. Katrinka Blazing: [phone]   In the event of inclement weather, please call our main line at [phone] for an update on the status of any delays or closures.  Dermatology Medication Tips: Please keep the boxes that topical medications come in in order to help keep track of the instructions about where and how to use these. Pharmacies typically print the medication instructions only on the boxes and not directly on the medication tubes.   If your medication is too expensive, please contact our office at  [phone] option 4 or send [place] a message through MyChart.   We are unable to tell what your co-pay for medications will be in advance as this is different depending on your insurance coverage. However, we may be able to find a substitute medication at lower cost or fill out paperwork to get insurance to cover a needed medication.   If a prior authorization is required to get your medication covered by your insurance company, please allow [place] 1-2 business days to complete this process.  Drug prices often vary depending on where the prescription is filled and some pharmacies may offer cheaper prices.  The website www.goodrx.com contains coupons for medications through different pharmacies. The prices here do not account for what the cost may be with help from insurance (it may be cheaper with your insurance), but the website can give you the price if you did not use any insurance.  - You can print the associated coupon and take it with your prescription to the pharmacy.  - You may also stop by our office during regular business hours and pick up a GoodRx coupon card.  - If you need your prescription sent electronically to a different pharmacy, notify our office through [facility] or by phone at [phone] option 4.     Si Usted Necesita Algo Despus de Su Visita  Tambin puede enviarnos un mensaje a travs de Clinical cytogeneticist. Por lo general respondemos a los mensajes de MyChart en el transcurso de 1 a 2 das hbiles.  Para renovar recetas, por favor pida a su farmacia que se ponga en contacto con nuestra oficina. Annie Sable de fax es [place] [phone].  Si tiene un asunto urgente cuando la clnica est cerrada y que no puede esperar hasta el siguiente da hbil, puede llamar/localizar a su doctor(a) al nmero que aparece a continuacin.   Por favor, tenga en cuenta que aunque hacemos todo lo posible para estar disponibles para asuntos urgentes fuera del horario de [place], no estamos  disponibles las 24 horas del da, los 7 [address]  [address] de [place].   Si tiene un problema urgente y no puede comunicarse con nosotros, puede optar por buscar atencin mdica  en el consultorio de su doctor(a), en una clnica privada, en un centro de atencin urgente o en una sala de emergencias.  Si tiene Engineer, drilling, por favor llame inmediatamente al 911 o vaya a la sala de emergencias.  Nmeros de bper  - Dr. Gwen Pounds: [phone]  - Dra. Roseanne Reno: [phone]  - Dr. Katrinka Blazing: [phone]   En caso de inclemencias del tiempo, por favor llame a Lacy Duverney principal al [phone] para una actualizacin sobre el [place] de cualquier retraso o cierre.  Consejos para la medicacin en dermatologa: Por favor, guarde las cajas en las que vienen los medicamentos de uso tpico para ayudarle a seguir las instrucciones sobre dnde y cmo usarlos. Las farmacias generalmente imprimen las instrucciones del medicamento slo en las  cajas y no directamente en los tubos del medicamento.   Si su medicamento es muy caro, por favor, pngase en contacto con Rolm Gala llamando al [phone] y presione la opcin 4 o envenos un mensaje a travs de Clinical cytogeneticist.   No podemos decirle cul ser su copago por los medicamentos por adelantado ya que esto es diferente dependiendo de la cobertura de su seguro. Sin embargo, es posible que podamos encontrar un medicamento sustituto a Audiological scientist un formulario para que el seguro cubra el medicamento que se considera necesario.   Si se requiere una autorizacin previa para que su compaa de seguros [place] su medicamento, por favor permtanos de 1 a 2 das hbiles para completar [address]  Los precios de los medicamentos varan con frecuencia dependiendo del Environmental consultant de dnde se surte la receta y alguna farmacias pueden ofrecer precios ms baratos.  El sitio web www.goodrx.com tiene cupones para medicamentos de Health and safety inspector. Los precios aqu no  tienen en cuenta lo que podra costar con la ayuda del seguro (puede ser ms barato con su seguro), pero el sitio web puede darle el precio si no utiliz Tourist information centre manager.  - Puede imprimir el cupn correspondiente y llevarlo con su receta a la farmacia.  - Tambin puede pasar por nuestra oficina durante el horario de atencin regular y Education officer, museum una tarjeta de cupones de GoodRx.  - Si necesita que su receta se enve electrnicamente a una farmacia diferente, informe a nuestra oficina a travs de MyChart de [place] o por telfono llamando al [phone] y presione la opcin 4.

## 2023-02-21 ENCOUNTER — Other Ambulatory Visit (INDEPENDENT_AMBULATORY_CARE_PROVIDER_SITE_OTHER): Payer: Self-pay | Admitting: Vascular Surgery

## 2023-02-21 DIAGNOSIS — I739 Peripheral vascular disease, unspecified: Secondary | ICD-10-CM

## 2023-02-21 DIAGNOSIS — I6523 Occlusion and stenosis of bilateral carotid arteries: Secondary | ICD-10-CM

## 2023-02-25 NOTE — Telephone Encounter (Addendum)
    Received notification from [employer] that patient was required to apply for Medicare Extra Help before re-enrollment application could continue processing. I called and spoke with patient who indicated that they spoke with a representative from Medicare about 2 weeks ago and applied for Medicare Extra Help. Patient knows to bring in Letter of Approval/ Denial once received so I can submit to [employer] for continued processing of application. I will continue to follow and update until final determination.   Ardeen Fillers, CPhT Oncology Pharmacy Patient Advocate  [facility]  [phone] (phone) [phone] (fax) 02/25/2023 9:19 AM

## 2023-02-26 ENCOUNTER — Encounter (INDEPENDENT_AMBULATORY_CARE_PROVIDER_SITE_OTHER): Payer: Self-pay

## 2023-02-26 ENCOUNTER — Ambulatory Visit (INDEPENDENT_AMBULATORY_CARE_PROVIDER_SITE_OTHER): Payer: Medicare PPO | Admitting: Vascular Surgery

## 2023-02-26 ENCOUNTER — Ambulatory Visit (INDEPENDENT_AMBULATORY_CARE_PROVIDER_SITE_OTHER): Payer: Medicare PPO

## 2023-02-26 DIAGNOSIS — I739 Peripheral vascular disease, unspecified: Secondary | ICD-10-CM

## 2023-02-26 DIAGNOSIS — I6523 Occlusion and stenosis of bilateral carotid arteries: Secondary | ICD-10-CM | POA: Diagnosis not present

## 2023-02-27 ENCOUNTER — Encounter: Payer: Self-pay | Admitting: Dermatology

## 2023-03-05 LAB — VAS US ABI WITH/WO TBI
Left ABI: 1.11
Right ABI: 1.11

## 2023-03-18 ENCOUNTER — Other Ambulatory Visit: Payer: Self-pay | Admitting: Internal Medicine

## 2023-03-18 NOTE — Telephone Encounter (Signed)
Component Ref Range & Units 1 mo ago (01/21/23) 4 mo ago (11/16/22) 6 mo ago (09/10/22) 8 mo ago (07/13/22) 10 mo ago (05/14/22) 1 yr ago (03/07/22) 1 yr ago (02/05/22)  Potassium 3.5 - 5.1 mmol/L 4.2 3.9 3.8 4.2 3.6 4.4 4.3 R

## 2023-03-20 ENCOUNTER — Telehealth: Payer: Self-pay

## 2023-03-20 NOTE — Telephone Encounter (Signed)
Oral Oncology Patient Advocate Encounter   **AZ&Me PAP to [facility] in Jan 2025**  Was successful in securing patient a $4,000.00 grant from [employer] to provide copayment coverage for Tagrisso.  This will keep the out of pocket expense at $0.     I have spoken with the patient.    The billing information is as follows and has been shared with [facility].   Member ID: [id number] Group ID: [id number] RxBin: [id number] PCN: [id number] Dates of Eligibility: 02/19/23 through 02/19/24  Fund name:  Non Small Cell Lung Cancer.   Ardeen Fillers, CPhT Oncology Pharmacy Patient Advocate  [facility]  [phone] (phone) [phone] (fax) 03/20/2023

## 2023-04-05 ENCOUNTER — Ambulatory Visit (INDEPENDENT_AMBULATORY_CARE_PROVIDER_SITE_OTHER): Payer: Medicare PPO

## 2023-04-05 DIAGNOSIS — I442 Atrioventricular block, complete: Secondary | ICD-10-CM | POA: Diagnosis not present

## 2023-04-10 LAB — CUP PACEART REMOTE DEVICE CHECK
Battery Remaining Longevity: 59 mo
Battery Voltage: 2.95 V
Brady Statistic AS VP Percent: 77.85 %
Brady Statistic AS VS Percent: 0 %
Brady Statistic RV Percent Paced: 99.56 %
Date Time Interrogation Session: 20241231173100
Implantable Pulse Generator Implant Date: 20200901
Lead Channel Impedance Value: 490 Ohm
Lead Channel Pacing Threshold Amplitude: 0.75 V
Lead Channel Pacing Threshold Pulse Width: 0.24 ms
Lead Channel Sensing Intrinsic Amplitude: 21.938 mV
Lead Channel Setting Pacing Amplitude: 1.25 V
Lead Channel Setting Pacing Pulse Width: 0.24 ms
Lead Channel Setting Sensing Sensitivity: 2.8 mV

## 2023-04-11 ENCOUNTER — Other Ambulatory Visit: Payer: Self-pay

## 2023-04-11 ENCOUNTER — Telehealth: Payer: Self-pay

## 2023-04-11 ENCOUNTER — Other Ambulatory Visit (HOSPITAL_COMMUNITY): Payer: Self-pay

## 2023-04-11 ENCOUNTER — Other Ambulatory Visit: Payer: Self-pay | Admitting: Pharmacist

## 2023-04-11 ENCOUNTER — Encounter: Payer: Self-pay | Admitting: Internal Medicine

## 2023-04-11 DIAGNOSIS — C3431 Malignant neoplasm of lower lobe, right bronchus or lung: Secondary | ICD-10-CM

## 2023-04-11 MED ORDER — OSIMERTINIB MESYLATE 80 MG PO TABS
80.0000 mg | ORAL_TABLET | Freq: Every day | ORAL | 1 refills | Status: DC
  Filled 2023-04-11: qty 30, 30d supply, fill #0
  Filled 2023-05-06: qty 30, 30d supply, fill #1

## 2023-04-11 NOTE — Progress Notes (Signed)
 Specialty Pharmacy Initial Fill Coordination Note  Tanner Vigna is a 88 y.o. female contacted today regarding initial fill of specialty medication(s) Eltrombopag  Olamine (PROMACTA ); Osimertinib  Mesylate (TAGRISSO )  Patient requested Delivery   Delivery date: 04/15/23   Verified address: [address], [place], [place] [address]  Medication will be filled on 04/12/23.   Patient is aware of $0.00 copayment. Bill CancerCare Secondary.    Morene Potters, CPhT Oncology Pharmacy Patient Advocate  [facility]  [phone] (phone) [phone] (fax) 04/11/2023 1:21 PM

## 2023-04-11 NOTE — Telephone Encounter (Signed)
 Oral Oncology Patient Advocate Encounter  Re-authorization   Received notification that prior authorization for Tagrisso  is due for renewal.   PA submitted on 04/11/23  Key [id number]  Status is pending     Morene Potters, CPhT Oncology Pharmacy Patient Advocate  [facility]  [phone] (phone) [phone] (fax) 04/11/2023 1:52 PM

## 2023-04-11 NOTE — Telephone Encounter (Signed)
 Oral Oncology Patient Advocate Encounter  Prior Authorization for Tagrisso  has been approved.    PA# [id number]  Effective dates: 04/10/23 through 04/08/24  Patients co-pay is $2,000.00.  Patient has Environmental Consultant to make co-pay $0.00.    Morene Potters, CPhT Oncology Pharmacy Patient Advocate  [facility]  [phone] (phone) [phone] (fax) 04/11/2023 1:53 PM

## 2023-04-11 NOTE — Telephone Encounter (Signed)
 Patient successfully OnBoarded. Medication scheduled to be shipped on Friday, 04/12/23, for delivery on Monday, 04/14/22, from [facility] to patient's address. Patient also knows to call me at [phone] with any questions or concerns regarding receiving medication or if there is any unexpected change in co-pay.    Morene Potters, CPhT Oncology Pharmacy Patient Advocate  [facility]  [phone] (phone) [phone] (fax) 04/11/2023 2:02 PM

## 2023-04-11 NOTE — Progress Notes (Signed)
 Patient switching to filling Stacy Cook and Tagrisso at [facility] (Specialty)

## 2023-04-11 NOTE — Progress Notes (Signed)
 Specialty Pharmacy Initiation Note   Stacy Cook is a 88 y.o. female who will be followed by the specialty pharmacy service for RxSp Oncology    Review of administration, indication, effectiveness, safety, potential side effects, storage/disposable, and missed dose instructions occurred today for patient's specialty medication(s) Eltrombopag  Olamine (PROMACTA ); Osimertinib  Mesylate (TAGRISSO )     Patient/Caregiver did not have any additional questions or concerns.   Patient's therapy is appropriate to: Continue    Goals Addressed             This Visit's Progress    Slow Disease Progression       Patient is on track. Patient will maintain adherence         Elvert Cumpton N Jahmar Mckelvy Specialty Pharmacist

## 2023-04-12 ENCOUNTER — Other Ambulatory Visit: Payer: Self-pay

## 2023-04-15 ENCOUNTER — Other Ambulatory Visit: Payer: Self-pay

## 2023-04-15 ENCOUNTER — Other Ambulatory Visit: Payer: Self-pay | Admitting: Pharmacist

## 2023-04-15 DIAGNOSIS — D693 Immune thrombocytopenic purpura: Secondary | ICD-10-CM

## 2023-04-15 MED ORDER — ELTROMBOPAG OLAMINE 25 MG PO TABS
25.0000 mg | ORAL_TABLET | Freq: Every day | ORAL | 2 refills | Status: DC
  Filled 2023-04-15: qty 30, 30d supply, fill #0
  Filled 2023-05-06: qty 30, 30d supply, fill #1
  Filled 2023-06-07: qty 30, 30d supply, fill #2

## 2023-04-15 NOTE — Progress Notes (Signed)
 Patient switching to filling Val Riles and Tagrisso at [facility] (Specialty)

## 2023-04-15 NOTE — Progress Notes (Signed)
 Specialty Pharmacy Initial Fill Coordination Note  Stacy Cook is a 88 y.o. female contacted today regarding initial fill of specialty medication(s) Promacta  (eltrombopag )  Patient requested Delivery  Delivery date: 04/17/23  Verified address: [address] [place], [place] [address]  Medication will be filled on 04/16/23.   Patient is aware of $0.00 copayment. Address Verified.    Morene Potters, CPhT Oncology Pharmacy Patient Advocate  [facility]  [phone] (phone) [phone] (fax) 04/15/2023 10:06 AM

## 2023-04-16 ENCOUNTER — Encounter: Payer: Self-pay | Admitting: Internal Medicine

## 2023-04-16 ENCOUNTER — Other Ambulatory Visit: Payer: Self-pay

## 2023-04-16 ENCOUNTER — Other Ambulatory Visit (HOSPITAL_COMMUNITY): Payer: Self-pay

## 2023-04-16 ENCOUNTER — Telehealth: Payer: Self-pay

## 2023-04-16 NOTE — Telephone Encounter (Signed)
 Oral Oncology Patient Advocate Encounter  Prior Authorization for Promacta  has been approved.    PA# [id number]  Effective dates: 04/16/23 through 10/13/23  Patients co-pay is $0.00.    Morene Potters, CPhT Oncology Pharmacy Patient Advocate  [facility]  [phone] (phone) [phone] (fax) 04/16/2023 1:01 PM

## 2023-04-16 NOTE — Telephone Encounter (Signed)
 Oral Oncology Patient Advocate Encounter  Re-authorization   Received notification that prior authorization for Promacta  is due for renewal.   PA submitted on 04/16/23  Key [id number]  Status is pending     Morene Potters, CPhT Oncology Pharmacy Patient Advocate  [facility]  [phone] (phone) [phone] (fax) 04/16/2023 1:01 PM

## 2023-04-17 ENCOUNTER — Encounter: Payer: Self-pay | Admitting: Internal Medicine

## 2023-04-23 ENCOUNTER — Inpatient Hospital Stay: Payer: Medicare HMO

## 2023-04-23 ENCOUNTER — Encounter: Payer: Self-pay | Admitting: Internal Medicine

## 2023-04-23 ENCOUNTER — Inpatient Hospital Stay (HOSPITAL_BASED_OUTPATIENT_CLINIC_OR_DEPARTMENT_OTHER): Payer: Medicare HMO | Admitting: Internal Medicine

## 2023-04-23 ENCOUNTER — Inpatient Hospital Stay: Payer: Medicare HMO | Attending: Internal Medicine

## 2023-04-23 VITALS — BP 147/60 | HR 65 | Temp 96.7°F | Resp 17 | Wt 105.2 lb

## 2023-04-23 DIAGNOSIS — C160 Malignant neoplasm of cardia: Secondary | ICD-10-CM | POA: Diagnosis not present

## 2023-04-23 DIAGNOSIS — D519 Vitamin B12 deficiency anemia, unspecified: Secondary | ICD-10-CM | POA: Diagnosis not present

## 2023-04-23 DIAGNOSIS — Z85028 Personal history of other malignant neoplasm of stomach: Secondary | ICD-10-CM | POA: Insufficient documentation

## 2023-04-23 DIAGNOSIS — I4891 Unspecified atrial fibrillation: Secondary | ICD-10-CM | POA: Insufficient documentation

## 2023-04-23 DIAGNOSIS — Z7901 Long term (current) use of anticoagulants: Secondary | ICD-10-CM | POA: Diagnosis not present

## 2023-04-23 DIAGNOSIS — I2510 Atherosclerotic heart disease of native coronary artery without angina pectoris: Secondary | ICD-10-CM | POA: Insufficient documentation

## 2023-04-23 DIAGNOSIS — J45909 Unspecified asthma, uncomplicated: Secondary | ICD-10-CM | POA: Diagnosis not present

## 2023-04-23 DIAGNOSIS — I129 Hypertensive chronic kidney disease with stage 1 through stage 4 chronic kidney disease, or unspecified chronic kidney disease: Secondary | ICD-10-CM | POA: Insufficient documentation

## 2023-04-23 DIAGNOSIS — J90 Pleural effusion, not elsewhere classified: Secondary | ICD-10-CM | POA: Diagnosis not present

## 2023-04-23 DIAGNOSIS — E1151 Type 2 diabetes mellitus with diabetic peripheral angiopathy without gangrene: Secondary | ICD-10-CM | POA: Insufficient documentation

## 2023-04-23 DIAGNOSIS — Z860100 Personal history of colon polyps, unspecified: Secondary | ICD-10-CM | POA: Diagnosis not present

## 2023-04-23 DIAGNOSIS — E1122 Type 2 diabetes mellitus with diabetic chronic kidney disease: Secondary | ICD-10-CM | POA: Insufficient documentation

## 2023-04-23 DIAGNOSIS — Z87442 Personal history of urinary calculi: Secondary | ICD-10-CM | POA: Insufficient documentation

## 2023-04-23 DIAGNOSIS — R197 Diarrhea, unspecified: Secondary | ICD-10-CM | POA: Diagnosis not present

## 2023-04-23 DIAGNOSIS — C3431 Malignant neoplasm of lower lobe, right bronchus or lung: Secondary | ICD-10-CM

## 2023-04-23 DIAGNOSIS — D693 Immune thrombocytopenic purpura: Secondary | ICD-10-CM | POA: Insufficient documentation

## 2023-04-23 DIAGNOSIS — E785 Hyperlipidemia, unspecified: Secondary | ICD-10-CM | POA: Diagnosis not present

## 2023-04-23 DIAGNOSIS — D649 Anemia, unspecified: Secondary | ICD-10-CM | POA: Diagnosis not present

## 2023-04-23 DIAGNOSIS — K219 Gastro-esophageal reflux disease without esophagitis: Secondary | ICD-10-CM | POA: Diagnosis not present

## 2023-04-23 DIAGNOSIS — Z85828 Personal history of other malignant neoplasm of skin: Secondary | ICD-10-CM | POA: Insufficient documentation

## 2023-04-23 DIAGNOSIS — Z8673 Personal history of transient ischemic attack (TIA), and cerebral infarction without residual deficits: Secondary | ICD-10-CM | POA: Diagnosis not present

## 2023-04-23 LAB — CBC WITH DIFFERENTIAL (CANCER CENTER ONLY)
Abs Immature Granulocytes: 0.01 10*3/uL (ref 0.00–0.07)
Basophils Absolute: 0 10*3/uL (ref 0.0–0.1)
Basophils Relative: 1 %
Eosinophils Absolute: 0.1 10*3/uL (ref 0.0–0.5)
Eosinophils Relative: 1 %
HCT: 35.3 % — ABNORMAL LOW (ref 36.0–46.0)
Hemoglobin: 11.6 g/dL — ABNORMAL LOW (ref 12.0–15.0)
Immature Granulocytes: 0 %
Lymphocytes Relative: 22 %
Lymphs Abs: 1.2 10*3/uL (ref 0.7–4.0)
MCH: 30.6 pg (ref 26.0–34.0)
MCHC: 32.9 g/dL (ref 30.0–36.0)
MCV: 93.1 fL (ref 80.0–100.0)
Monocytes Absolute: 0.5 10*3/uL (ref 0.1–1.0)
Monocytes Relative: 8 %
Neutro Abs: 3.8 10*3/uL (ref 1.7–7.7)
Neutrophils Relative %: 68 %
Platelet Count: 136 10*3/uL — ABNORMAL LOW (ref 150–400)
RBC: 3.79 MIL/uL — ABNORMAL LOW (ref 3.87–5.11)
RDW: 12.8 % (ref 11.5–15.5)
WBC Count: 5.6 10*3/uL (ref 4.0–10.5)
nRBC: 0 % (ref 0.0–0.2)

## 2023-04-23 LAB — CMP (CANCER CENTER ONLY)
ALT: 10 U/L (ref 0–44)
AST: 21 U/L (ref 15–41)
Albumin: 3.7 g/dL (ref 3.5–5.0)
Alkaline Phosphatase: 79 U/L (ref 38–126)
Anion gap: 10 (ref 5–15)
BUN: 29 mg/dL — ABNORMAL HIGH (ref 8–23)
CO2: 25 mmol/L (ref 22–32)
Calcium: 8.8 mg/dL — ABNORMAL LOW (ref 8.9–10.3)
Chloride: 103 mmol/L (ref 98–111)
Creatinine: 1.42 mg/dL — ABNORMAL HIGH (ref 0.44–1.00)
GFR, Estimated: 36 mL/min — ABNORMAL LOW (ref >60)
Glucose, Bld: 90 mg/dL (ref 70–99)
Potassium: 4.1 mmol/L (ref 3.5–5.1)
Sodium: 138 mmol/L (ref 135–145)
Total Bilirubin: 0.5 mg/dL (ref 0.0–1.2)
Total Protein: 6.4 g/dL — ABNORMAL LOW (ref 6.5–8.1)

## 2023-04-23 LAB — LACTATE DEHYDROGENASE: LDH: 171 U/L (ref 98–192)

## 2023-04-23 NOTE — Assessment & Plan Note (Addendum)
#   Stage IV recurrent adenocarcinoma the lung; EGFR mutated. On osimertinib  80 mg [Feb 6th 2020]. November 07, 2022- Status post right lower lobectomy. Similar loculated, thick-walled right-sided pleural fluid collection with resolution of pleural air. Similar adjacent collapse/consolidation within the right lung base. No convincing evidence of recurrent or metastatic disease. No acute process or evidence of metastatic disease in the abdomen/pelvis.   # currently on Osimertinib  80 mg/day; tolerating well. See above/ Continue current therapy-no obvious evidence of recurrent malignancy at this time. Thoracentesis negative any malignant cells- STABLE.; will repeat CT scan chest non- contrast  #Right pleural effusion-status postthoracentesis-pleural fluid: Transudative; cytology negative for malignancy.  Question CHF/fluid overload.  S/p Lasix  20 mg a day- improved/stable. Currently not on any lasix  [ARF].  STABLE.    # Chronic anemia- Hb 10-18 Dec 2021- Iron  sat- 22%; ferritin- 137- continue Iron  pills once day. Hold off Venofer - STABLE.   # ITP- chronic- platelets> 100 on promacta  25 mg/day- STABLE.   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 STABLE.   # A-fib [feb 2024] on Eliquis  2.5/cardiology. STABLE.   # right sided stroke/ seizures-on asprin; off plavix ;; f/u neurology-[place]-  STABLE.   # CKD- stage IV; Left Hydronephrosis s/p ureteral stent placement/nephrolithiasis-~ GFR 34 today; Hypokalemia-3.4  continue K-citrate q day. STABLE.   # IV ACCESS: PIV  # DISPOSITION: # no venofer  today # follow up in 3  months- MD: labs- cbc/cmp;iron  studies; ferritin;LDH; possible IV venofer ; CT chest non- contrast  Dr.B

## 2023-04-23 NOTE — Progress Notes (Signed)
 Complains of no energy in a long time now. Denies pain. No appetite. Denies nausea. Has mild diarrhea occasionally. Feels cold all the time. Pt has started coughing which can take her breath, no sputum. Nose runs clear water trying to eat or drink 75% or more of the time.

## 2023-04-23 NOTE — Progress Notes (Signed)
 [facility] OFFICE PROGRESS NOTE  Patient Care Team: [name], MD as PCP - General (Internal Medicine) Cindie Ole DASEN, MD as PCP - Electrophysiology (Cardiology) Rennie Cindy SAUNDERS, MD as Consulting Physician (Internal Medicine) Aundria Ladell POUR, MD as Consulting Physician (Gastroenterology) Cindie Ole DASEN, MD as Consulting Physician (Cardiology)   Cancer Staging  Primary malignant neoplasm of right lower lobe of lung [facility]) Staging form: Lung, AJCC 7th Edition - Clinical: No stage assigned - Unsigned    Oncology History Overview Note  # DEC 2017- Adeno ca [GATA; her 2 Neu-NEG]; signet ring [1.5 x3 mm gastric incisura; Dr.Skulskie]; EUS [Dr.Burnbridge]; no significant abnormality noted;  Reviewed at [facility] also. JAN 2018- PET NED. April 2018- S/p partial gastrectomy [Dr.Sankar]- STAGE I ADENO CA; NO adjuvant therapy.  # STAGE I CARCINOID s/p partial gastrectomy   # May 2018- Chronic Atrophic gastritis- Prevpac   # FEB 2017- ADENOCARCINOMA with Lepidic 80%-20% acinar pattern; pT2a [Stage IB;T-2.3cm; visceral pleural invasion present; pN=0 ]; AUG 2017- CT NED;   # DEC 2019- RECURRENT/STAGE IV ADENO LUNG CA- EGFR MUTATED;# Jan 6th 2020-; START Osidemrtinib;  # AUG 2020- SEVERE AS [s/p TAVR; [place]]-complicated by R sided stroke/ seizures/acute renal failure.   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 [omniseq]  # Palliative: O-1/20   DIAGNOSIS:  #ADENO CA LUNG-STAGE IV #Stomach adeno ca [stage I; dec 2017]  GOALS: pallaitive  CURRENT/MOST RECENT THERAPY - OSIMERTINIB  [Jan 6th 2020]      Primary malignant neoplasm of right lower lobe of lung (HCC)  12/02/2015 Initial Diagnosis   Primary malignant neoplasm of right lower lobe of lung (HCC)   Adenocarcinoma of gastric cardia (HCC)   INTERVAL HISTORY: Alone.  Ambulating independantly.   Stacy Cook 88 y.o.  female pleasant patient above  history of recurrent/stage IV adenocarcinoma lung/EGFR mutated currently on osimertinib ; ITP on promacta ; A.fib on eliquis  is here for follow-up.   Complains of no energy in a long time now. Denies pain. No appetite. Denies nausea. Has mild diarrhea occasionally. Feels cold all the time.   Pt has started coughing which can take her breath, no sputum. Nose runs clear water trying to eat or drink 75% or more of the time.   Currently on eliquis  2.5 mg BID by cardiology. No hemoptysis.  Denies any worsening swelling of the legs.  Patient states that she is compliant with her oral iron .  No constipation.  Review of Systems  Constitutional:  Positive for malaise/fatigue. Negative for chills, diaphoresis and fever.  HENT:  Negative for nosebleeds and sore throat.   Eyes:  Negative for double vision.  Respiratory:  Negative for hemoptysis, sputum production and wheezing.   Cardiovascular:  Negative for chest pain, palpitations, orthopnea and leg swelling.  Gastrointestinal:  Negative for blood in stool, constipation, melena, nausea and vomiting.  Genitourinary:  Negative for dysuria, frequency and urgency.  Musculoskeletal:  Positive for back pain and joint pain.  Neurological:  Positive for focal weakness. Negative for dizziness, tingling, weakness and headaches.  Endo/Heme/Allergies:  Bruises/bleeds easily.  Psychiatric/Behavioral:  Negative for depression. The patient is not nervous/anxious and does not have insomnia.       PAST MEDICAL HISTORY :  Past Medical History:  Diagnosis Date   Abdominal pain    Acid reflux 03/24/2015   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   Allergic genetic state    Anemia    Arteriosclerosis of  coronary artery 03/24/2015   Arthritis    back, hands (10/15/2017)   Asthma    B12 deficiency anemia    Basal cell carcinoma 01/01/2022   glabella, ED[facility]   Basal cell carcinoma 05/03/2022   R ant deltoid, [facility]   Basal cell carcinoma 05/03/2022   Xyphoid  excised 06/12/2022   Bile reflux gastritis    CAD (coronary artery disease)    Cancer of right lung (HCC) 05/09/2015   Dr. Volney performed Right lower lobe lobectomy.    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 08/03/2016   Carotid stenosis 02/07/2016   Cataract cortical, senile    Chest pain    Colon polyp    Degeneration of intervertebral disc of lumbar region 03/11/2014   Diabetes (HCC)    Gastric adenocarcinoma (HCC)    GERD (gastroesophageal reflux disease)    also, history of ulcers   History of kidney stones    History of stroke    HLD (hyperlipidemia) 08/24/2013   Hypertension    Malignant tumor of stomach (HCC) 07/2016   Adenocarcinoma, diffuse, poorly differentiated, signet ring, stage I   Neuritis or radiculitis due to rupture of lumbar intervertebral disc 09/10/2014   Neuroendocrine tumor 03/24/2015   Neuroma    Osteoporosis    Peripheral vascular disease (HCC)    PONV (postoperative nausea and vomiting)    Presence of permanent cardiac pacemaker 12/09/2018   Primary malignant neoplasm of right lower lobe of lung (HCC) 12/02/2015   Renal insufficiency    Renal stone    S/P TAVR (transcatheter aortic valve replacement)    Sciatica    Severe aortic stenosis    Skin cancer    cut/burned LUE; cut off right eye/nose & cut off chest (10/15/2017)   Stomach ulcer    Thrombocytopenia (HCC)    Type 2 diabetes, diet controlled (HCC)    no RX since stomach OR 07/2016 (10/15/2017)    PAST SURGICAL HISTORY :   Past Surgical History:  Procedure Laterality Date   APPENDECTOMY     CARDIAC CATHETERIZATION  X2 before 10/15/2017   CARDIAC VALVE REPLACEMENT     CATARACT EXTRACTION W/PHACO Left 04/04/2017   Procedure: CATARACT EXTRACTION PHACO AND INTRAOCULAR LENS PLACEMENT (IOC);  Surgeon: Mittie Gaskin, MD;  Location: [facility] ORS;  Service: Ophthalmology;  Laterality: Left;  Lot # [id number] H US  1:00 Ap 25% CDE 8.54   CATARACT EXTRACTION W/PHACO Right 05/15/2017   Procedure:  CATARACT EXTRACTION PHACO AND INTRAOCULAR LENS PLACEMENT (IOC);  Surgeon: Mittie Gaskin, MD;  Location: [facility];  Service: Ophthalmology;  Laterality: Right;  US  01:10 AP% 18.3 CDE 12.91 Fluid pack lot # [id number]   COLONOSCOPY     CORONARY ATHERECTOMY N/A 10/15/2017   Procedure: CORONARY ATHERECTOMY;  Surgeon: Verlin Lonni BIRCH, MD;  [facility];  Service: Cardiovascular;  Laterality: N/A;   CORONARY STENT INTERVENTION N/A 10/15/2017   Procedure: CORONARY STENT INTERVENTION;  Surgeon: Verlin Lonni BIRCH, MD;  [facility]: [facility];  Service: Cardiovascular;  Laterality: N/A;   CYSTOSCOPY/URETEROSCOPY/HOLMIUM LASER/STENT PLACEMENT Left 08/03/2019   Procedure: CYSTOSCOPY/URETEROSCOPY/HOLMIUM LASER/STENT PLACEMENT;  Surgeon: Penne Knee, MD;  Location: [facility];  Service: Urology;  Laterality: Left;   ESOPHAGOGASTRODUODENOSCOPY     ESOPHAGOGASTRODUODENOSCOPY (EGD) WITH PROPOFOL  N/A 03/27/2016   Procedure: ESOPHAGOGASTRODUODENOSCOPY (EGD) WITH PROPOFOL ;  Surgeon: Gladis RAYMOND Mariner, MD;  Location: [facility] ENDOSCOPY;  Service: Endoscopy;  Laterality: N/A;   ESOPHAGOGASTRODUODENOSCOPY (EGD) WITH PROPOFOL  N/A 05/28/2016   Procedure: ESOPHAGOGASTRODUODENOSCOPY (EGD) WITH PROPOFOL ;  Surgeon:  Gladis RAYMOND Mariner, MD;  Location: [facility] ENDOSCOPY;  Service: Endoscopy;  Laterality: N/A;   ESOPHAGOGASTRODUODENOSCOPY (EGD) WITH PROPOFOL  N/A 09/25/2016   Procedure: ESOPHAGOGASTRODUODENOSCOPY (EGD) WITH PROPOFOL ;  Surgeon: Dellie Louanne MATSU, MD;  Location: [facility] ENDOSCOPY;  Service: Endoscopy;  Laterality: N/A;   ESOPHAGOGASTRODUODENOSCOPY (EGD) WITH PROPOFOL  N/A 12/18/2016   Procedure: ESOPHAGOGASTRODUODENOSCOPY (EGD) WITH PROPOFOL ;  Surgeon: Dellie Louanne MATSU, MD;  Location: [facility] ENDOSCOPY;  Service: Endoscopy;  Laterality: N/A;   ESOPHAGOGASTRODUODENOSCOPY (EGD) WITH PROPOFOL  N/A 01/23/2017   Procedure: ESOPHAGOGASTRODUODENOSCOPY (EGD) WITH PROPOFOL ;  Surgeon:  Dellie Louanne MATSU, MD;  Location: [facility] END[facility];  Service: Endoscopy;  Laterality: N/A;   ESOPHAGOGASTRODUODENOSCOPY (EGD) WITH PROPOFOL  N/A 03/20/2017   Procedure: ESOPHAGOGASTRODUODENOSCOPY (EGD) WITH PROPOFOL ;  Surgeon: Dellie Louanne MATSU, MD;  Location: [facility] END[facility]COPY;  Service: Endoscopy;  Laterality: N/A;   ESOPHAGOGASTRODUODENOSCOPY (EGD) WITH PROPOFOL  N/A 09/23/2020   Procedure: ESOPHAGOGASTRODUODENOSCOPY (EGD) WITH PROPOFOL ;  Surgeon: Maryruth Ole DASEN, MD;  Location: [facility] ENDOSCOPY;  Service: Endoscopy;  Laterality: N/A;  [facility] SAYS AMPICILLIN IS NEEDED   FRACTURE SURGERY     INSERT / REPLACE / REMOVE PACEMAKER  12/09/2018   PACEMAKER LEADLESS INSERTION N/A 12/09/2018   Procedure: PACEMAKER LEADLESS INSERTION;  Surgeon: Kelsie Agent, MD;  [facility];  Service: Cardiovascular;  Laterality: N/A;   PARTIAL GASTRECTOMY N/A 08/03/2016   Hemigastrectomy, Billroth I reconstruction Surgeon: Louanne MATSU Dellie, MD;  Location: [facility];  Service: General;  Laterality: N/A;   RIGHT/LEFT HEART CATH AND CORONARY ANGIOGRAPHY Bilateral 09/19/2017   Procedure: RIGHT/LEFT HEART CATH AND CORONARY ANGIOGRAPHY;  Surgeon: Florencio Cara BIRCH, MD;  Location: [facility];  Service: Cardiovascular;  Laterality: Bilateral;   SHOULDER ARTHROSCOPY W/ CAPSULAR REPAIR Right    SKIN CANCER EXCISION     cut/burned LUE; cut off right eye/nose & cut off chest (10/15/2017)   TEE WITHOUT CARDIOVERSION N/A 12/02/2018   Procedure: TRANSESOPHAGEAL ECHOCARDIOGRAM (TEE);  Surgeon: Verlin Lonni BIRCH, MD;  [facility]: [facility];  Service: Open Heart Surgery;  Laterality: N/A;   THORACOTOMY Right 05/09/2015   Procedure: THORACOTOMY, RIGHT LOWER LOBECTOMY, BRONCHOSCOPY;  Surgeon: Evalene Glasser, MD;  Location: [facility];  Service: Thoracic;  Laterality: Right;   TONSILLECTOMY  1944   TRANSCATHETER AORTIC VALVE REPLACEMENT, TRANSFEMORAL N/A 12/02/2018   Procedure: TRANSCATHETER  AORTIC VALVE REPLACEMENT, TRANSFEMORAL;  Surgeon: Verlin Lonni BIRCH, MD;  [facility];  Service: Open Heart Surgery;  Laterality: N/A;   TUBAL LIGATION     UPPER GI ENDOSCOPY N/A 08/03/2016   Procedure: UPPER  ENDOSCOPY;  Surgeon: Louanne MATSU Dellie, MD;  Location: [facility];  Service: General;  Laterality: N/A;   VAGINAL HYSTERECTOMY     WRIST FRACTURE SURGERY Right     FAMILY HISTORY :   Family History  Problem Relation Age of Onset   Aortic aneurysm Mother    Kidney Stones Mother    Hypertension Mother    Hypertension Father    Diabetes Other    Breast cancer Neg Hx    Prostate cancer Neg Hx    Bladder Cancer Neg Hx    Kidney cancer Neg Hx     SOCIAL HISTORY:   Social History   Tobacco Use   Smoking status: Never   Smokeless tobacco: Never  Vaping Use   Vaping status: Never Used  Substance Use Topics   Alcohol  use: Not Currently   Drug use: Never    ALLERGIES:  is allergic to mirtazapine, pneumococcal vaccine, statins, and sulfa antibiotics.  MEDICATIONS:  Current Outpatient Medications  Medication Sig Dispense Refill   apixaban  (ELIQUIS ) 2.5 MG TABS tablet Take 1 tablet by mouth twice daily 60 tablet 5   cholecalciferol  (VITAMIN D ) 1000 UNITS tablet Take 1,000 Units by mouth daily.     cyanocobalamin  1000 MCG tablet Take 1,000 mcg by mouth daily.      eltrombopag  (PROMACTA ) 25 MG tablet Take 1 tablet (25 mg total) by mouth daily. Take on an empty stomach, 1 hour before a meal or 2 hours after. 30 tablet 2   ezetimibe  (ZETIA ) 10 MG tablet Take 10 mg by mouth daily.     ferrous sulfate  325 (65 FE) MG tablet Take 325 mg by mouth daily with breakfast.     isosorbide  mononitrate (IMDUR ) 30 MG 24 hr tablet Take 1 tablet by mouth once daily 90 tablet 3   Magnesium  100 MG TABS Take by mouth.     metoprolol  succinate (TOPROL -XL) 50 MG 24 hr tablet Take 1 tablet (50 mg total) by mouth every evening. Take with or immediately following a meal. 30  tablet 0   osimertinib  mesylate (TAGRISSO ) 80 MG tablet Take 1 tablet (80 mg total) by mouth daily. 30 tablet 1   potassium citrate  (UROCIT-K ) 10 MEQ (1080 MG) SR tablet Take 1 tablet by mouth once daily 90 tablet 0   No current facility-administered medications for this visit.    PHYSICAL EXAMINATION: ECOG PERFORMANCE STATUS: 0 - Asymptomatic  BP (!) 147/60 (BP Location: Left Arm, Patient Position: Sitting)   Pulse 65   Temp (!) 96.7 F (35.9 C) (Tympanic)   Resp 17   Wt 105 lb 3.2 oz (47.7 kg)   SpO2 100%   BMI 19.88 kg/m   Filed Weights   04/23/23 1316  Weight: 105 lb 3.2 oz (47.7 kg)     Decreased breath sounds on the right side compared to left.  Physical Exam Constitutional:      Comments: Patient walking by herself; no assistive devices.  Alone.  HENT:     Head: Normocephalic and atraumatic.     Mouth/Throat:     Pharynx: No oropharyngeal exudate.  Eyes:     Pupils: Pupils are equal, round, and reactive to light.  Cardiovascular:     Rate and Rhythm: Normal rate and regular rhythm.     Heart sounds: Murmur heard.  Pulmonary:     Effort: No respiratory distress.     Breath sounds: No wheezing.  Abdominal:     General: Bowel sounds are normal. There is no distension.     Palpations: Abdomen is soft. There is no mass.     Tenderness: There is no abdominal tenderness. There is no guarding or rebound.  Musculoskeletal:        General: No tenderness. Normal range of motion.     Cervical back: Normal range of motion and neck supple.  Skin:    General: Skin is warm.  Neurological:     Mental Status: She is alert and oriented to person, place, and time.     Comments: Weakness of the right upper extremity more than lower extremity.  Psychiatric:        Mood and Affect: Affect normal.    LABORATORY DATA:  I have reviewed the data as listed    Component Value Date/Time   NA 138 04/23/2023 1300   NA 141 02/05/2022 1405   NA 141 07/21/2012 1529   K 4.1  04/23/2023 1300   K 3.9 07/21/2012 1529  CL 103 04/23/2023 1300   CL 109 (H) 07/21/2012 1529   CO2 25 04/23/2023 1300   CO2 27 07/21/2012 1529   GLUCOSE 90 04/23/2023 1300   GLUCOSE 104 (H) 07/21/2012 1529   BUN 29 (H) 04/23/2023 1300   BUN 34 (H) 02/05/2022 1405   BUN 16 07/21/2012 1529   CREATININE 1.42 (H) 04/23/2023 1300   CREATININE 1.11 09/03/2012 1535   CALCIUM  8.8 (L) 04/23/2023 1300   CALCIUM  8.9 07/21/2012 1529   PROT 6.4 (L) 04/23/2023 1300   PROT 7.1 07/21/2012 1529   ALBUMIN 3.7 04/23/2023 1300   ALBUMIN 3.9 07/21/2012 1529   AST 21 04/23/2023 1300   ALT 10 04/23/2023 1300   ALT 22 07/21/2012 1529   ALKPHOS 79 04/23/2023 1300   ALKPHOS 76 07/21/2012 1529   BILITOT 0.5 04/23/2023 1300   GFRNONAA 36 (L) 04/23/2023 1300   GFRNONAA 49 (L) 09/03/2012 1535   GFRAA 29 (L) 01/05/2020 1440   GFRAA 57 (L) 09/03/2012 1535    No results found for: SPEP, UPEP  Lab Results  Component Value Date   WBC 5.6 04/23/2023   NEUTROABS 3.8 04/23/2023   HGB 11.6 (L) 04/23/2023   HCT 35.3 (L) 04/23/2023   MCV 93.1 04/23/2023   PLT 136 (L) 04/23/2023      Chemistry      Component Value Date/Time   NA 138 04/23/2023 1300   NA 141 02/05/2022 1405   NA 141 07/21/2012 1529   K 4.1 04/23/2023 1300   K 3.9 07/21/2012 1529   CL 103 04/23/2023 1300   CL 109 (H) 07/21/2012 1529   CO2 25 04/23/2023 1300   CO2 27 07/21/2012 1529   BUN 29 (H) 04/23/2023 1300   BUN 34 (H) 02/05/2022 1405   BUN 16 07/21/2012 1529   CREATININE 1.42 (H) 04/23/2023 1300   CREATININE 1.11 09/03/2012 1535      Component Value Date/Time   CALCIUM  8.8 (L) 04/23/2023 1300   CALCIUM  8.9 07/21/2012 1529   ALKPHOS 79 04/23/2023 1300   ALKPHOS 76 07/21/2012 1529   AST 21 04/23/2023 1300   ALT 10 04/23/2023 1300   ALT 22 07/21/2012 1529   BILITOT 0.5 04/23/2023 1300       RADIOGRAPHIC STUDIES: I have personally reviewed the radiological images as listed and agreed with the findings in the  report. No results found.   ASSESSMENT & PLAN:  Primary malignant neoplasm of right lower lobe of lung (HCC) # Stage IV recurrent adenocarcinoma the lung; EGFR mutated. On osimertinib  80 mg [Feb 6th 2020]. November 07, 2022- Status post right lower lobectomy. Similar loculated, thick-walled right-sided pleural fluid collection with resolution of pleural air. Similar adjacent collapse/consolidation within the right lung base. No convincing evidence of recurrent or metastatic disease. No acute process or evidence of metastatic disease in the abdomen/pelvis.   # currently on Osimertinib  80 mg/day; tolerating well. See above/ Continue current therapy-no obvious evidence of recurrent malignancy at this time. Thoracentesis negative any malignant cells- STABLE.; will repeat CT scan chest non- contrast  #Right pleural effusion-status postthoracentesis-pleural fluid: Transudative; cytology negative for malignancy.  Question CHF/fluid overload.  S/p Lasix  20 mg a day- improved/stable. Currently not on any lasix  [ARF].  STABLE.    # Chronic anemia- Hb 10-18 Dec 2021- Iron  sat- 22%; ferritin- 137- continue Iron  pills once day. Hold off Venofer - STABLE.   # ITP- chronic- platelets> 100 on promacta  25 mg/day- STABLE.   # Stomach cancer stage I status post gastrectomy;  June 2022- S/p EGD- [facility]. STABLE.   # A-fib [feb 2024] on Eliquis  2.5/cardiology. STABLE.   # right sided stroke/ seizures-on asprin; off plavix ;; f/u neurology-[place]-  STABLE.   # CKD- stage IV; Left Hydronephrosis s/p ureteral stent placement/nephrolithiasis-~ GFR 34 today; Hypokalemia-3.4  continue K-citrate q day. STABLE.   # IV ACCESS: PIV  # DISPOSITION: # no venofer  today # follow up in 3  months- MD: labs- cbc/cmp;iron  studies; ferritin;LDH; possible IV venofer ; CT chest non- contrast  Dr.B        Orders Placed This Encounter  Procedures   CT CHEST WO CONTRAST    Standing Status:   Future    Expected Date:   07/22/2023     Expiration Date:   04/22/2024    Preferred imaging location?:   [facility]    Radiology Contrast Protocol - do NOT remove file path:   [email or web address]   CBC with Differential (Cancer Center Only)    Standing Status:   Future    Expected Date:   07/22/2023    Expiration Date:   04/22/2024   CMP (Cancer Center only)    Standing Status:   Future    Expected Date:   07/22/2023    Expiration Date:   04/22/2024   Iron  and TIBC    Standing Status:   Future    Expected Date:   07/22/2023    Expiration Date:   04/22/2024   Ferritin    Standing Status:   Future    Expected Date:   07/22/2023    Expiration Date:   04/22/2024   Lactate dehydrogenase    Standing Status:   Future    Expected Date:   07/22/2023    Expiration Date:   04/22/2024   All questions were answered. The patient knows to call the clinic with any problems, questions or concerns.      Cindy JONELLE Joe, MD 04/23/2023 2:06 PM

## 2023-04-26 NOTE — Telephone Encounter (Signed)
Since patient met OOM by filling Tagrisso and using grant, Val Riles will now be filled at [facility] along with Tagrisso. PAP application cancelled.    Ardeen Fillers, CPhT Oncology Pharmacy Patient Advocate  [facility]  [phone] (phone) [phone] (fax) 04/26/2023 1:27 PM

## 2023-05-02 ENCOUNTER — Other Ambulatory Visit: Payer: Self-pay

## 2023-05-06 ENCOUNTER — Other Ambulatory Visit: Payer: Self-pay

## 2023-05-06 NOTE — Progress Notes (Signed)
Specialty Pharmacy Refill Coordination Note  Stacy Cook is a 88 y.o. female contacted today regarding refills of specialty medication(s) Eltrombopag Olamine (PROMACTA); Osimertinib Mesylate [name])   Patient requested Delivery   Delivery date: 05/15/23   Verified address: [address] [place], [place] [address]   Medication will be filled on 05/14/23.

## 2023-05-06 NOTE — Progress Notes (Signed)
Specialty Pharmacy Ongoing Clinical Assessment Note  Stacy Cook is a 88 y.o. female who is being followed by the specialty pharmacy service for RxSp Oncology   Patient's specialty medication(s) reviewed today: Eltrombopag Olamine (PROMACTA); Osimertinib Mesylate (TAGRISSO)   Missed doses in the last 4 weeks: 0   Patient/Caregiver did not have any additional questions or concerns.   Therapeutic benefit summary: Unable to assess   Adverse events/side effects summary: No adverse events/side effects   Patient's therapy is appropriate to: Continue    Goals Addressed             This Visit's Progress    Stabilization of disease       Patient is on track. Patient will maintain adherence         Follow up:  3 months  Bobette Mo Specialty Pharmacist

## 2023-05-10 NOTE — Addendum Note (Signed)
Addended by: Elease Etienne A on: 05/10/2023 09:19 AM   Modules accepted: Orders

## 2023-05-10 NOTE — Progress Notes (Signed)
 Remote pacemaker transmission.

## 2023-05-20 DIAGNOSIS — E785 Hyperlipidemia, unspecified: Secondary | ICD-10-CM | POA: Diagnosis not present

## 2023-05-20 DIAGNOSIS — E1121 Type 2 diabetes mellitus with diabetic nephropathy: Secondary | ICD-10-CM | POA: Diagnosis not present

## 2023-05-20 DIAGNOSIS — D61818 Other pancytopenia: Secondary | ICD-10-CM | POA: Diagnosis not present

## 2023-05-20 DIAGNOSIS — E1151 Type 2 diabetes mellitus with diabetic peripheral angiopathy without gangrene: Secondary | ICD-10-CM | POA: Diagnosis not present

## 2023-05-20 DIAGNOSIS — N184 Chronic kidney disease, stage 4 (severe): Secondary | ICD-10-CM | POA: Diagnosis not present

## 2023-05-26 DIAGNOSIS — D6869 Other thrombophilia: Secondary | ICD-10-CM | POA: Insufficient documentation

## 2023-05-26 DIAGNOSIS — I4891 Unspecified atrial fibrillation: Secondary | ICD-10-CM | POA: Insufficient documentation

## 2023-05-27 DIAGNOSIS — I129 Hypertensive chronic kidney disease with stage 1 through stage 4 chronic kidney disease, or unspecified chronic kidney disease: Secondary | ICD-10-CM | POA: Diagnosis not present

## 2023-05-27 DIAGNOSIS — N183 Chronic kidney disease, stage 3 unspecified: Secondary | ICD-10-CM | POA: Diagnosis not present

## 2023-05-27 DIAGNOSIS — E1122 Type 2 diabetes mellitus with diabetic chronic kidney disease: Secondary | ICD-10-CM | POA: Diagnosis not present

## 2023-05-27 DIAGNOSIS — I25118 Atherosclerotic heart disease of native coronary artery with other forms of angina pectoris: Secondary | ICD-10-CM | POA: Diagnosis not present

## 2023-05-27 DIAGNOSIS — I69359 Hemiplegia and hemiparesis following cerebral infarction affecting unspecified side: Secondary | ICD-10-CM | POA: Diagnosis not present

## 2023-05-27 DIAGNOSIS — D3A8 Other benign neuroendocrine tumors: Secondary | ICD-10-CM | POA: Diagnosis not present

## 2023-05-27 DIAGNOSIS — D61818 Other pancytopenia: Secondary | ICD-10-CM | POA: Diagnosis not present

## 2023-05-27 DIAGNOSIS — C3490 Malignant neoplasm of unspecified part of unspecified bronchus or lung: Secondary | ICD-10-CM | POA: Diagnosis not present

## 2023-05-27 DIAGNOSIS — E1151 Type 2 diabetes mellitus with diabetic peripheral angiopathy without gangrene: Secondary | ICD-10-CM | POA: Diagnosis not present

## 2023-06-06 ENCOUNTER — Ambulatory Visit: Payer: Medicare HMO | Attending: Physician Assistant | Admitting: Physician Assistant

## 2023-06-06 ENCOUNTER — Encounter: Payer: Self-pay | Admitting: Physician Assistant

## 2023-06-06 VITALS — BP 154/60 | HR 67 | Ht 61.0 in | Wt 103.6 lb

## 2023-06-06 DIAGNOSIS — I5032 Chronic diastolic (congestive) heart failure: Secondary | ICD-10-CM

## 2023-06-06 DIAGNOSIS — I480 Paroxysmal atrial fibrillation: Secondary | ICD-10-CM

## 2023-06-06 DIAGNOSIS — Z952 Presence of prosthetic heart valve: Secondary | ICD-10-CM

## 2023-06-06 DIAGNOSIS — Z950 Presence of cardiac pacemaker: Secondary | ICD-10-CM

## 2023-06-06 DIAGNOSIS — I4891 Unspecified atrial fibrillation: Secondary | ICD-10-CM | POA: Diagnosis not present

## 2023-06-06 MED ORDER — EZETIMIBE 10 MG PO TABS: 10.0000 mg | ORAL_TABLET | Freq: Every day | ORAL | 3 refills | Status: DC

## 2023-06-06 MED ORDER — APIXABAN 2.5 MG PO TABS: 2.5000 mg | ORAL_TABLET | Freq: Two times a day (BID) | ORAL | 3 refills | Status: DC

## 2023-06-06 MED ORDER — ISOSORBIDE MONONITRATE ER 30 MG PO TB24: 30.0000 mg | ORAL_TABLET | Freq: Every day | ORAL | 3 refills | Status: DC

## 2023-06-06 MED ORDER — AMOXICILLIN 500 MG PO TABS: 2000.0000 mg | ORAL_TABLET | Freq: Once | ORAL | 1 refills | Status: AC

## 2023-06-06 NOTE — Progress Notes (Unsigned)
 Cardiology Office Note:  .   Date:  06/06/2023  ID:  Stacy Cook, DOB Jun 06, 1935, MRN [id number] PCP: Lynnea Ferrier, MD  [facility] Providers Cardiologist:  None Electrophysiologist:  Lanier Prude, MD {  History of Present Illness: .   Stacy Cook is a 88 y.o. female with history of gastric cancer status post partial gastrectomy in 2019, lung cancer status post right lower lobectomy in 2017 with reoccurrence and malignant pleural effusion, pancytopenia, CKD stage III, carotid artery disease, diabetes, HLD with statin intolerance, HTN, multivessel CAD status post PCI and severe AS status post TAVR (12/02/2018) complicated by acute CVA with right hemiparesis and CHB status post leadless PPM (12/09/2018).  Echocardiogram August 2018 showed normal LV systolic function with LVEF 55%, moderate LVH and severe aortic stenosis with mean gradient 43 mmHg.  Cardiac cath June 2019 at [facility] showed severe three-vessel CAD.  Referred to Dr. Clifton James 09/2017 for consultation of multivessel PCI and TAVR.  She complained of DOE and chest pressure as well as near syncope.  Repeat echo 10/11/2017 showed EF 66 5%, moderate AAS and mild AR, mean gradient 31 mmHg, peak gradient 52 mmHg, DVI 0.34, AVA 0.56 cm.  Underwent cardiac cath 10/15/2017 with PCI/DES to distal RCA and orbital atherectomy/DES to mid LCx.  Was placed on DAPT with aspirin/Plavix.  Cath showed AAS with mean gradient 17 mmHg felt to be moderately severe.  She had an improvement in chest pain after cath.  Plan for following AAS.  Repeat echo 01/20/2018 with LVEF 65 to 70%, severe AS.  Plans for TAVR.  Pre-TAVR CTs revealed new pleural effusion suspicious for recurrent malignancy.  Cytology confirmed the presence of malignant cells and reoccurrence of her lung cancer.  Referred to oncology and TAVR was delayed.  Treated with oral chemotherapy and had 2 thoracenteses.  Significant DOE and fatigue felt to be related to severe AS.  CT  of the chest 10/2018 showed stable partially loculated right pleural effusion.  Reevaluated by multidisciplinary valve team and felt to be suitable candidate for TAVR.  Underwent successful TAVR with Medtronic E volt Pro plus TVH that he has a TF approach 12/02/2018.  Postop echo with EF 65%, normal functioning TAVR with mean gradient of 6 and mild PVL.  Unfortunately, developed right-sided weakness and numbness the evening after TAVR.  CTA of the head and neck showed diffuse atherosclerosis.  MRI showed left pontine and frontal embolic stroke.  Was discharged to inpatient rehab where she developed CHB and was readmitted for leadless pacemaker on 12/09/2018.  She went back to inpatient rehab and was discharged home 12/27/2018.   She was seen by Dr. Shari Prows 01/16/2022 and she had new nocturnal dyspnea and cough as well as intermittent lower extremity edema.  CT showed right-sided pleural effusion and new small left-sided pleural effusion possibly reflective of CHF.  Started on Lasix 20 mg daily.  Otherwise, chronic DOE that is stable.  Felt off balance since her stroke.  BNP was 6035.  Advised to increase the Lasix to 40 mg twice a day for 4 days then back down to 40 daily.  Creatinine unfortunately bumped to 2.27.  Eventually serum creatinine improved to 1.31.  Echo revealed LVEF 65% with mildly elevated pulmonary artery systolic pressure, normally functioning AV without significant PVL.  She was seen for follow-up February 2024 and at that time reported she was feeling well but was diagnosed with a lung infection recently.  States she had right-sided chest  pain since being told she has an infection.  Got dehydrated with Lasix that she stopped.  Home BP 113/60.  No significant chest pain or shortness of breath at that appointment.  I saw her 12/2022, she tells me that sometimes she does breathe heavier with activity but this has been going on for a while.  No chest pain.  She does not feel her A-fib if she is  going in and out.  Compliant with Eliquis without issues.  Blood pressure 140/60 today.  Occasionally experiences dizziness and lightheadedness which occurs randomly but mostly when she is changing positions.  Sounding like orthostatic hypotension.  Encouraged increasing water intake and wearing lower extremity compression stockings to help.  No syncope.  No falls.  Echocardiogram scheduled for next month.  No swelling in her legs that she is noticed.  She does need a refill of her amoxicillin for upcoming dental work.  She should not need to hold her Eliquis for simple cleanings and simple extractions (1 tooth).  She does mention that occasionally she has a bandlike sensation around her bra area even when she is not wearing a bra.  She cannot remember what her symptoms were back when she had stents of her coronary arteries.  She says it is not severe.  If this gets worse she is to let us know.  She states that she has had asthma dating back to when she was a little girl.  She was given adrenaline in her arm at that time and wondered if it has anything to do with her current heart problems.  Reports no shortness of breath nor dyspnea on exertion. Reports no chest pain, pressure, or tightness. No edema, orthopnea, PND. Reports no palpitations.   The patient, with a history of atrial fibrillation, TAVR valve, and mitral valve calcification, presents for a routine follow-up. The patient reports experiencing shortness of breath during activities, which has been stable over the past several months. The patient also reports occasional difficulty breathing when lying down to sleep, but this resolves upon sitting up. The patient denies any chest pain. The patient's weight has been stable, and there is no swelling in the ankles. The patient's blood pressure is slightly high during the visit, but the patient reports that it is usually well-controlled at home. The patient denies any issues with the pacemaker.  Reports no  shortness of breath nor dyspnea on exertion. Reports no chest pain, pressure, or tightness. No edema,  PND. Reports no palpitations.   Discussed the use of AI scribe software for clinical note transcription with the patient, who gave verbal consent to proceed.  ROS: Pertinent ROS in HPI  Studies Reviewed: .        02/04/23 Echo IMPRESSIONS     1. Left ventricular ejection fraction, by estimation, is 60 to 65%. The  left ventricle has normal function. The left ventricle has no regional  wall motion abnormalities. Left ventricular diastolic parameters are  consistent with Grade II diastolic  dysfunction (pseudonormalization). Elevated left atrial pressure.   2. Right ventricular systolic function is normal. The right ventricular  size is normal. Tricuspid regurgitation signal is inadequate for assessing  PA pressure.   3. Left atrial size was moderately dilated.   4. The mitral valve is degenerative. Mild mitral valve regurgitation. No  evidence of mitral stenosis. Moderate to severe mitral annular  calcification.   5. The aortic valve has been repaired/replaced. Aortic valve  regurgitation is not visualized. There is a 23 mm  CoreValve-Evolut Pro  prosthetic (TAVR) valve present in the aortic position. Procedure Date:  2020. Echo findings are consistent with normal  structure and function of the aortic valve prosthesis. Aortic valve mean  gradient measures 9.4 mmHg. Aortic valve Vmax measures 2.02 m/s. Aortic  valve acceleration time measures 74 msec.   6. The inferior vena cava is normal in size with greater than 50%  respiratory variability, suggesting right atrial pressure of 3 mmHg.   Comparison(s): No significant change from prior study. Prior images  reviewed side by side. The previously described regional wall motion  abnormalities are artifactual (pacing related dyssynchrony).   FINDINGS   Left Ventricle: Left ventricular ejection fraction, by estimation, is 60  to 65%.  The left ventricle has normal function. The left ventricle has no  regional wall motion abnormalities. The left ventricular internal cavity  size was normal in size. There is   no left ventricular hypertrophy. Abnormal (paradoxical) septal motion,  consistent with RV pacemaker. Left ventricular diastolic parameters are  consistent with Grade II diastolic dysfunction (pseudonormalization).  Elevated left atrial pressure.   Right Ventricle: The right ventricular size is normal. Right vetricular  wall thickness was not well visualized. Right ventricular systolic  function is normal. Tricuspid regurgitation signal is inadequate for  assessing PA pressure.   Left Atrium: Left atrial size was moderately dilated.   Right Atrium: Right atrial size was normal in size.   Pericardium: There is no evidence of pericardial effusion.   Mitral Valve: The mitral valve is degenerative in appearance. There is  mild thickening of the mitral valve leaflet(s). There is mild  calcification of the mitral valve leaflet(s). Moderate to severe mitral  annular calcification. Mild mitral valve  regurgitation. No evidence of mitral valve stenosis.   Tricuspid Valve: The tricuspid valve is normal in structure. Tricuspid  valve regurgitation is not demonstrated.   Aortic Valve: The aortic valve has been repaired/replaced. Aortic valve  regurgitation is not visualized. Aortic valve mean gradient measures 9.4  mmHg. Aortic valve peak gradient measures 16.3 mmHg. There is a 23 mm  CoreValve-Evolut Pro prosthetic,  stented (TAVR) valve present in the aortic position. Procedure Date: 2020.  Echo findings are consistent with normal structure and function of the  aortic valve prosthesis.   Pulmonic Valve: The pulmonic valve was grossly normal. Pulmonic valve  regurgitation is not visualized. No evidence of pulmonic stenosis.   Aorta: The aortic root is normal in size and structure.   Venous: The inferior vena cava  is normal in size with greater than 50%  respiratory variability, suggesting right atrial pressure of 3 mmHg.   IAS/Shunts: No atrial level shunt detected by color flow Doppler.     Echocardiogram 02/05/2022 IMPRESSIONS     1. Left ventricular ejection fraction, by estimation, is 60 to 65%. The  left ventricle has normal function. The left ventricle has no regional  wall motion abnormalities. There is mild left ventricular hypertrophy.  Left ventricular diastolic parameters  are indeterminate.   2. Right ventricular systolic function is normal. The right ventricular  size is normal. There is mildly elevated pulmonary artery systolic  pressure. The estimated right ventricular systolic pressure is 44.5 mmHg.   3. Left atrial size was moderately dilated.   4. The mitral valve is normal in structure. Mild mitral valve  regurgitation. No evidence of mitral stenosis. Severe mitral annular  calcification.   5. The aortic valve has been repaired/replaced. Aortic valve  regurgitation is not visualized. No  aortic stenosis is present. There is a  23 mm CoreValve-Evolut Pro prosthetic (TAVR) valve present in the aortic  position. Procedure Date: 12/02/2018. Echo  findings are consistent with normal structure and function of the aortic  valve prosthesis. Aortic valve mean gradient measures 9.4 mmHg. Aortic  valve Vmax measures 2.16 m/s.   6. The inferior vena cava is normal in size with greater than 50%  respiratory variability, suggesting right atrial pressure of 3 mmHg.   Comparison(s): No significant change from prior study.   FINDINGS   Left Ventricle: Left ventricular ejection fraction, by estimation, is 60  to 65%. The left ventricle has normal function. The left ventricle has no  regional wall motion abnormalities. The left ventricular internal cavity  size was normal in size. There is   mild left ventricular hypertrophy. Left ventricular diastolic parameters  are indeterminate.      LV Wall Scoring:  The mid and distal anterior wall, mid and distal anterior septum, and  entire  apex are akinetic.   Right Ventricle: The right ventricular size is normal. No increase in  right ventricular wall thickness. Right ventricular systolic function is  normal. There is mildly elevated pulmonary artery systolic pressure. The  tricuspid regurgitant velocity is 3.22   m/s, and with an assumed right atrial pressure of 3 mmHg, the estimated  right ventricular systolic pressure is 44.5 mmHg.   Left Atrium: Left atrial size was moderately dilated.   Right Atrium: Right atrial size was normal in size.   Pericardium: There is no evidence of pericardial effusion.   Mitral Valve: The mitral valve is normal in structure. There is moderate  thickening of the mitral valve leaflet(s). There is moderate calcification  of the mitral valve leaflet(s). Severe mitral annular calcification. Mild  mitral valve regurgitation. No  evidence of mitral valve stenosis.   Tricuspid Valve: The tricuspid valve is normal in structure. Tricuspid  valve regurgitation is mild . No evidence of tricuspid stenosis.   Aortic Valve: The aortic valve has been repaired/replaced. Aortic valve  regurgitation is not visualized. No aortic stenosis is present. Aortic  valve mean gradient measures 9.4 mmHg. Aortic valve peak gradient measures  18.7 mmHg. Aortic valve area, by VTI   measures 0.82 cm. There is a 23 mm CoreValve-Evolut Pro prosthetic,  stented (TAVR) valve present in the aortic position. Procedure Date:  12/02/2018. Echo findings are consistent with normal structure and  function of the aortic valve prosthesis.   Pulmonic Valve: The pulmonic valve was normal in structure. Pulmonic valve  regurgitation is mild. No evidence of pulmonic stenosis.   Aorta: The aortic root is normal in size and structure.   Venous: The inferior vena cava is normal in size with greater than 50%  respiratory  variability, suggesting right atrial pressure of 3 mmHg.   IAS/Shunts: No atrial level shunt detected by color flow Doppler.      Risk Assessment/Calculations:    CHA2DS2-VASc Score =     This indicates a  % annual risk of stroke. The patient's score is based upon:        Physical Exam:   VS:  BP (!) 154/60   Pulse 67   Ht 5\' 1"  (1.549 m)   Wt 103 lb 9.6 oz (47 kg)   SpO2 98%   BMI 19.58 kg/m    Wt Readings from Last 3 Encounters:  06/06/23 103 lb 9.6 oz (47 kg)  04/23/23 105 lb 3.2 oz (47.7 kg)  01/21/23 108  lb (49 kg)    GEN: Well nourished, well developed in no acute distress NECK: No JVD; No carotid bruits CARDIAC: RRR, + systolic murmurs, rubs, gallops RESPIRATORY:  Clear to auscultation without rales, wheezing or rhonchi  ABDOMEN: Soft, non-tender, non-distended EXTREMITIES:  No edema; No deformity   ASSESSMENT AND PLAN: .    Atrial Fibrillation Asymptomatic with intermittent episodes. Pacemaker in place with no reported issues. -Continue current management. -Plan for follow-up with Dr. Lalla Brothers in the summer for pacemaker check.  Mitral Valve Calcification Stable with no significant change from previous echocardiogram. -Continue monitoring.  Shortness of Breath/HFpEF Occurs with exertion, stable over the past several months. No associated weight gain or ankle swelling. -Advise patient to report if symptoms worsen.  Hypertension Elevated blood pressure noted during visit, possibly due to white coat hypertension. Patient reports normal readings at home. -Advise patient to monitor blood pressure at home and report if consistently above 145 systolic.  Severe AS Status post TAVR -She has a systolic murmur that is present on exam today and she states that it has been like this for a few years -Plan for repeat echocardiogram next month -No syncope or presyncope -Would continue current medications including Eliquis 2.5 mg twice a day, Zetia 10 mg daily, Imdur 30  mg daily, metoprolol succinate 50 mg daily, magnesium 100 mg daily and potassium 10 mEq daily -Provided her with a new amoxicillin 2 g once an hour before dental procedures prescription  Pacemaker -No recent issues -This was placed right after her TAVR procedure  Coronary artery disease status post PCI -No further chest pain   Dispo: She can follow-up in 6 months with Dr. Cristal Deer or myself  Signed, Sharlene Dory, PA-C

## 2023-06-06 NOTE — Patient Instructions (Signed)
 Medication Instructions:  Refilled cardiac medications *If you need a refill on your cardiac medications before your next appointment, please call your pharmacy*  Follow-Up: At [facility], you and your health needs are our priority.  As part of our continuing mission to provide you with exceptional heart care, we have created designated Provider Care Teams.  These Care Teams include your primary Cardiologist (physician) and Advanced Practice Providers (APPs -  Physician Assistants and Nurse Practitioners) who all work together to provide you with the care you need, when you need it.  We recommend signing up for the patient portal called "MyChart".  Sign up information is provided on this After Visit Summary.  MyChart is used to connect with patients for Virtual Visits (Telemedicine).  Patients are able to view lab/test results, encounter notes, upcoming appointments, etc.  Non-urgent messages can be sent to your provider as well.   To learn more about what you can do with MyChart, go to [email or web address].    Your next appointment:   6 month(s)  Provider:   Jari Favre, PA  Other Instructions Track BP. Let us know if top number is over [phone]

## 2023-06-07 ENCOUNTER — Other Ambulatory Visit: Payer: Self-pay | Admitting: Internal Medicine

## 2023-06-07 ENCOUNTER — Other Ambulatory Visit: Payer: Self-pay

## 2023-06-07 DIAGNOSIS — C3431 Malignant neoplasm of lower lobe, right bronchus or lung: Secondary | ICD-10-CM

## 2023-06-07 NOTE — Progress Notes (Signed)
 Specialty Pharmacy Refill Coordination Note  Stacy Cook is a 88 y.o. female contacted today regarding refills of specialty medication(s) Eltrombopag Olamine (PROMACTA); Osimertinib Mesylate Stacy Cook)   Patient requested Delivery   Delivery date: 06/12/23   Verified address: [address] [place], [place] [address]   Medication will be filled on 06/11/23, pending refill approval for Tagrisso.   This fill date is pending response to refill request from provider. Patient is aware and if they have not received fill by intended date they must follow up with pharmacy.

## 2023-06-10 ENCOUNTER — Other Ambulatory Visit: Payer: Self-pay

## 2023-06-10 ENCOUNTER — Encounter: Payer: Self-pay | Admitting: Internal Medicine

## 2023-06-10 MED ORDER — OSIMERTINIB MESYLATE 80 MG PO TABS
80.0000 mg | ORAL_TABLET | Freq: Every day | ORAL | 1 refills | Status: DC
  Filled 2023-06-10: qty 30, 30d supply, fill #0
  Filled 2023-07-04: qty 30, 30d supply, fill #1

## 2023-06-12 ENCOUNTER — Other Ambulatory Visit (HOSPITAL_COMMUNITY): Payer: Self-pay

## 2023-06-12 ENCOUNTER — Encounter: Payer: Self-pay | Admitting: Internal Medicine

## 2023-06-12 ENCOUNTER — Telehealth: Payer: Self-pay | Admitting: Pharmacy Technician

## 2023-06-12 ENCOUNTER — Telehealth: Payer: Self-pay | Admitting: Physician Assistant

## 2023-06-12 NOTE — Telephone Encounter (Signed)
 PA request has been Submitted. New Encounter has been or will be created for follow up. For additional info see Pharmacy Prior Auth telephone encounter from 06/12/23.

## 2023-06-12 NOTE — Telephone Encounter (Signed)
 Pharmacy Patient Advocate Encounter   Received notification from Pt Calls Messages that prior authorization for eliquis 2.5mg  is required/requested.   Insurance verification completed.   The patient is insured through [employer] .   Per test claim: PA required; PA submitted to above mentioned insurance via CoverMyMeds Key/confirmation #/EOC [id number] Status is pending

## 2023-06-12 NOTE — Telephone Encounter (Signed)
 Pharmacy Patient Advocate Encounter  Received notification from [facility] that Prior Authorization for eliquis has been APPROVED from 04/10/23 to 04/08/24. Spoke to pharmacy to process.Copay is $0.00.    PA #/Case ID/Reference #: [id number]

## 2023-06-12 NOTE — Telephone Encounter (Signed)
 Pt c/o medication issue:  1. Name of Medication:   apixaban (ELIQUIS) 2.5 MG TABS tablet    2. How are you currently taking this medication (dosage and times per day)? As written   3. Are you having a reaction (difficulty breathing--STAT)? No   4. What is your medication issue?  Pt called in stating pharmacy has not received refill for Eliquis.   Called the pharmacy, she states it needs PA, please advise.

## 2023-06-25 ENCOUNTER — Telehealth: Payer: Self-pay | Admitting: Physician Assistant

## 2023-06-25 DIAGNOSIS — I480 Paroxysmal atrial fibrillation: Secondary | ICD-10-CM

## 2023-06-25 MED ORDER — APIXABAN 2.5 MG PO TABS: 2.5000 mg | ORAL_TABLET | Freq: Two times a day (BID) | ORAL | 1 refills | Status: DC

## 2023-06-25 NOTE — Telephone Encounter (Signed)
 Prescription refill request for Eliquis received. Indication: atrial fibrillation Last office visit: 05/2023 Scr: 1.42 Age:  87 Weight:  47 kg

## 2023-06-25 NOTE — Telephone Encounter (Signed)
*  STAT* If patient is at the pharmacy, call can be transferred to refill team.   1. Which medications need to be refilled? (please list name of each medication and dose if known) new prescription for Eliquis   2. Would you like to learn more about the convenience, safety, & potential cost savings by using the [facility]?      3. Are you open to using the [facility] (Type [facility].    4. Which pharmacy/location (including street and city if local pharmacy) is medication to be sent to?[facility] [address] [place]   5. Do they need a 30 day or 90 day supply? -#60 and refills- please call in today- been out of medicine

## 2023-07-04 ENCOUNTER — Other Ambulatory Visit: Payer: Self-pay

## 2023-07-04 ENCOUNTER — Other Ambulatory Visit: Payer: Self-pay | Admitting: Internal Medicine

## 2023-07-04 ENCOUNTER — Other Ambulatory Visit: Payer: Self-pay | Admitting: Pharmacy Technician

## 2023-07-04 DIAGNOSIS — D693 Immune thrombocytopenic purpura: Secondary | ICD-10-CM

## 2023-07-04 NOTE — Progress Notes (Signed)
 Specialty Pharmacy Refill Coordination Note  Stacy Cook is a 88 y.o. female contacted today regarding refills of specialty medication(s) Eltrombopag Olamine (PROMACTA); Osimertinib Mesylate (TAGRISSO)   Patient requested (Patient-Rptd) Delivery   Delivery date: (Patient-Rptd) 07/11/23   Verified address: (Patient-Rptd) [address]   [place], [place]   Medication will be filled on 07/10/23.   RR sent to MD for [facility]

## 2023-07-05 ENCOUNTER — Ambulatory Visit (INDEPENDENT_AMBULATORY_CARE_PROVIDER_SITE_OTHER): Payer: Medicare PPO

## 2023-07-05 DIAGNOSIS — I442 Atrioventricular block, complete: Secondary | ICD-10-CM

## 2023-07-08 ENCOUNTER — Ambulatory Visit
Admission: RE | Admit: 2023-07-08 | Discharge: 2023-07-08 | Disposition: A | Discharge status: Home / Self Care | Payer: Medicare HMO | Source: Ambulatory Visit | Attending: Internal Medicine | Admitting: Internal Medicine

## 2023-07-08 DIAGNOSIS — C3431 Malignant neoplasm of lower lobe, right bronchus or lung: Secondary | ICD-10-CM | POA: Insufficient documentation

## 2023-07-08 DIAGNOSIS — J90 Pleural effusion, not elsewhere classified: Secondary | ICD-10-CM | POA: Diagnosis not present

## 2023-07-08 DIAGNOSIS — C3491 Malignant neoplasm of unspecified part of right bronchus or lung: Secondary | ICD-10-CM | POA: Diagnosis not present

## 2023-07-08 LAB — CUP PACEART REMOTE DEVICE CHECK
Battery Remaining Longevity: 51 mo
Battery Voltage: 2.94 V
Brady Statistic AS VP Percent: 77.86 %
Brady Statistic AS VS Percent: 0 %
Brady Statistic RV Percent Paced: 99.55 %
Date Time Interrogation Session: 20250328110400
Implantable Pulse Generator Implant Date: 20200901
Lead Channel Impedance Value: 500 Ohm
Lead Channel Pacing Threshold Amplitude: 0.75 V
Lead Channel Pacing Threshold Pulse Width: 0.24 ms
Lead Channel Sensing Intrinsic Amplitude: 15.975 mV
Lead Channel Setting Pacing Amplitude: 1.375
Lead Channel Setting Pacing Pulse Width: 0.24 ms
Lead Channel Setting Sensing Sensitivity: 2.8 mV

## 2023-07-09 ENCOUNTER — Other Ambulatory Visit: Payer: Self-pay

## 2023-07-09 ENCOUNTER — Encounter: Payer: Self-pay | Admitting: Internal Medicine

## 2023-07-09 MED ORDER — ELTROMBOPAG OLAMINE 25 MG PO TABS
25.0000 mg | ORAL_TABLET | Freq: Every day | ORAL | 2 refills | Status: DC
  Filled 2023-07-09: qty 30, 30d supply, fill #0
  Filled 2023-07-31: qty 30, 30d supply, fill #1
  Filled 2023-09-03: qty 30, 30d supply, fill #2

## 2023-07-13 ENCOUNTER — Encounter: Payer: Self-pay | Admitting: Cardiology

## 2023-07-20 ENCOUNTER — Other Ambulatory Visit: Payer: Self-pay | Admitting: Internal Medicine

## 2023-07-20 MED ORDER — LEVOFLOXACIN 250 MG PO TABS: 250.0000 mg | ORAL_TABLET | Freq: Every day | ORAL | 0 refills | Status: DC

## 2023-07-20 NOTE — Progress Notes (Signed)
 I reviewed the CT scan- ? Pneumonia vs- carcinomatosis- recommend levaquin 250 mg/day [renal/age]. Keep appts planned on 4/14- added to MDT for 4/17- will disucss with pulmonary-   Spoke to pt - she is aware- GB  FYI

## 2023-07-22 ENCOUNTER — Encounter: Payer: Self-pay | Admitting: Internal Medicine

## 2023-07-22 ENCOUNTER — Inpatient Hospital Stay: Payer: Medicare HMO

## 2023-07-22 ENCOUNTER — Inpatient Hospital Stay: Payer: Medicare HMO | Attending: Internal Medicine

## 2023-07-22 ENCOUNTER — Inpatient Hospital Stay: Payer: Medicare HMO | Admitting: Internal Medicine

## 2023-07-22 VITALS — BP 154/56 | HR 64 | Temp 96.6°F | Resp 16 | Ht 61.0 in | Wt 100.4 lb

## 2023-07-22 DIAGNOSIS — I10 Essential (primary) hypertension: Secondary | ICD-10-CM | POA: Diagnosis not present

## 2023-07-22 DIAGNOSIS — Z860100 Personal history of colon polyps, unspecified: Secondary | ICD-10-CM | POA: Diagnosis not present

## 2023-07-22 DIAGNOSIS — I700 Atherosclerosis of aorta: Secondary | ICD-10-CM | POA: Diagnosis not present

## 2023-07-22 DIAGNOSIS — Z79899 Other long term (current) drug therapy: Secondary | ICD-10-CM | POA: Diagnosis not present

## 2023-07-22 DIAGNOSIS — K219 Gastro-esophageal reflux disease without esophagitis: Secondary | ICD-10-CM | POA: Insufficient documentation

## 2023-07-22 DIAGNOSIS — I1310 Hypertensive heart and chronic kidney disease without heart failure, with stage 1 through stage 4 chronic kidney disease, or unspecified chronic kidney disease: Secondary | ICD-10-CM | POA: Diagnosis not present

## 2023-07-22 DIAGNOSIS — C3431 Malignant neoplasm of lower lobe, right bronchus or lung: Secondary | ICD-10-CM | POA: Diagnosis not present

## 2023-07-22 DIAGNOSIS — M810 Age-related osteoporosis without current pathological fracture: Secondary | ICD-10-CM | POA: Diagnosis not present

## 2023-07-22 DIAGNOSIS — Z8673 Personal history of transient ischemic attack (TIA), and cerebral infarction without residual deficits: Secondary | ICD-10-CM | POA: Diagnosis not present

## 2023-07-22 DIAGNOSIS — E785 Hyperlipidemia, unspecified: Secondary | ICD-10-CM | POA: Insufficient documentation

## 2023-07-22 DIAGNOSIS — Z8719 Personal history of other diseases of the digestive system: Secondary | ICD-10-CM | POA: Diagnosis not present

## 2023-07-22 DIAGNOSIS — J45909 Unspecified asthma, uncomplicated: Secondary | ICD-10-CM | POA: Diagnosis not present

## 2023-07-22 DIAGNOSIS — E538 Deficiency of other specified B group vitamins: Secondary | ICD-10-CM | POA: Diagnosis not present

## 2023-07-22 DIAGNOSIS — Z952 Presence of prosthetic heart valve: Secondary | ICD-10-CM | POA: Diagnosis not present

## 2023-07-22 DIAGNOSIS — Z85828 Personal history of other malignant neoplasm of skin: Secondary | ICD-10-CM | POA: Insufficient documentation

## 2023-07-22 DIAGNOSIS — Z7901 Long term (current) use of anticoagulants: Secondary | ICD-10-CM | POA: Diagnosis not present

## 2023-07-22 DIAGNOSIS — D693 Immune thrombocytopenic purpura: Secondary | ICD-10-CM | POA: Diagnosis not present

## 2023-07-22 DIAGNOSIS — E1151 Type 2 diabetes mellitus with diabetic peripheral angiopathy without gangrene: Secondary | ICD-10-CM | POA: Insufficient documentation

## 2023-07-22 DIAGNOSIS — E1122 Type 2 diabetes mellitus with diabetic chronic kidney disease: Secondary | ICD-10-CM | POA: Diagnosis not present

## 2023-07-22 DIAGNOSIS — I2510 Atherosclerotic heart disease of native coronary artery without angina pectoris: Secondary | ICD-10-CM | POA: Diagnosis not present

## 2023-07-22 DIAGNOSIS — C160 Malignant neoplasm of cardia: Secondary | ICD-10-CM | POA: Diagnosis not present

## 2023-07-22 DIAGNOSIS — R569 Unspecified convulsions: Secondary | ICD-10-CM | POA: Diagnosis not present

## 2023-07-22 LAB — CBC WITH DIFFERENTIAL (CANCER CENTER ONLY)
Abs Immature Granulocytes: 0.02 10*3/uL (ref 0.00–0.07)
Basophils Absolute: 0 10*3/uL (ref 0.0–0.1)
Basophils Relative: 0 %
Eosinophils Absolute: 0 10*3/uL (ref 0.0–0.5)
Eosinophils Relative: 1 %
HCT: 35.1 % — ABNORMAL LOW (ref 36.0–46.0)
Hemoglobin: 11.5 g/dL — ABNORMAL LOW (ref 12.0–15.0)
Immature Granulocytes: 0 %
Lymphocytes Relative: 20 %
Lymphs Abs: 1 10*3/uL (ref 0.7–4.0)
MCH: 30.1 pg (ref 26.0–34.0)
MCHC: 32.8 g/dL (ref 30.0–36.0)
MCV: 91.9 fL (ref 80.0–100.0)
Monocytes Absolute: 0.4 10*3/uL (ref 0.1–1.0)
Monocytes Relative: 7 %
Neutro Abs: 3.7 10*3/uL (ref 1.7–7.7)
Neutrophils Relative %: 72 %
Platelet Count: 138 10*3/uL — ABNORMAL LOW (ref 150–400)
RBC: 3.82 MIL/uL — ABNORMAL LOW (ref 3.87–5.11)
RDW: 12.8 % (ref 11.5–15.5)
WBC Count: 5.1 10*3/uL (ref 4.0–10.5)
nRBC: 0 % (ref 0.0–0.2)

## 2023-07-22 LAB — CMP (CANCER CENTER ONLY)
ALT: 9 U/L (ref 0–44)
AST: 17 U/L (ref 15–41)
Albumin: 3.7 g/dL (ref 3.5–5.0)
Alkaline Phosphatase: 79 U/L (ref 38–126)
Anion gap: 9 (ref 5–15)
BUN: 30 mg/dL — ABNORMAL HIGH (ref 8–23)
CO2: 26 mmol/L (ref 22–32)
Calcium: 8.9 mg/dL (ref 8.9–10.3)
Chloride: 99 mmol/L (ref 98–111)
Creatinine: 1.62 mg/dL — ABNORMAL HIGH (ref 0.44–1.00)
GFR, Estimated: 31 mL/min — ABNORMAL LOW (ref >60)
Glucose, Bld: 84 mg/dL (ref 70–99)
Potassium: 4.5 mmol/L (ref 3.5–5.1)
Sodium: 134 mmol/L — ABNORMAL LOW (ref 135–145)
Total Bilirubin: 1.1 mg/dL (ref 0.0–1.2)
Total Protein: 6.5 g/dL (ref 6.5–8.1)

## 2023-07-22 LAB — IRON AND TIBC
Iron: 89 ug/dL (ref 28–170)
Saturation Ratios: 37 % — ABNORMAL HIGH (ref 10.4–31.8)
TIBC: 242 ug/dL — ABNORMAL LOW (ref 250–450)
UIBC: 153 ug/dL

## 2023-07-22 LAB — FERRITIN: Ferritin: 247 ng/mL (ref 11–307)

## 2023-07-22 LAB — LACTATE DEHYDROGENASE: LDH: 159 U/L (ref 98–192)

## 2023-07-22 NOTE — Progress Notes (Unsigned)
  [facility] OFFICE PROGRESS NOTE  Patient Care Team: Lynnea Ferrier, MD as PCP - General (Internal Medicine) Lanier Prude, MD as PCP - Electrophysiology (Cardiology) Earna Coder, MD as Consulting Physician (Internal Medicine) Stanton Kidney, MD as Consulting Physician (Gastroenterology) Lanier Prude, MD as Consulting Physician (Cardiology)   Cancer Staging  Primary malignant neoplasm of right lower lobe of lung [facility]) Staging form: Lung, AJCC 7th Edition - Clinical: No stage assigned - Unsigned    Oncology History Overview Note  # DEC 2017- Adeno ca [GATA; her 2 Neu-NEG]; signet ring [1.5 x3 mm gastric incisura; Dr.Skulskie]; EUS [Dr.Burnbridge]; no significant abnormality noted;  Reviewed at [facility] also. JAN 2018- PET NED. April 2018- S/p partial gastrectomy [Dr.Sankar]- STAGE I ADENO CA; NO adjuvant therapy.  # STAGE I CARCINOID s/p partial gastrectomy   # May 2018- Chronic Atrophic gastritis- Prevpac   # FEB 2017- ADENOCARCINOMA with Lepidic 80%-20% acinar pattern; pT2a [Stage IB;T-2.3cm; visceral pleural invasion present; pN=0 ]; AUG 2017- CT NED;   # DEC 2019- RECURRENT/STAGE IV ADENO LUNG CA- EGFR MUTATED;# Jan 6th 2020-; START Osidemrtinib;  # AUG 2020- SEVERE AS [s/p TAVR; [place]]-complicated by R sided stroke/ seizures/acute renal failure.   # DEC 2nd 2020- START PROMACTA 25 mg/day [ITP]  # Molecular testing: EFGR mutated [id number] [omniseq]  # Palliative: O-1/20   DIAGNOSIS:  #ADENO CA LUNG-STAGE IV #Stomach adeno ca [stage I; dec 2017]  GOALS: pallaitive  CURRENT/MOST RECENT THERAPY - OSIMERTINIB [Jan 6th 2020]      Primary malignant neoplasm of right lower lobe of lung (HCC)  12/02/2015 Initial Diagnosis   Primary malignant neoplasm of right lower lobe of lung (HCC)   Adenocarcinoma of gastric cardia (HCC)   INTERVAL HISTORY: Alone.  Ambulating independantly.   Stacy Cook 88 y.o.  female pleasant patient above  history of recurrent/stage IV adenocarcinoma lung/EGFR mutated currently on osimertinib; ITP on promacta; A.fib on eliquis is here for follow-up.   No appetite, 1 ensure per day. Should she drink more?   C/o coughing off and on x2-3 weeks   Complains of no energy in a long time now. Denies pain. No appetite. Denies nausea. Has mild diarrhea occasionally. Feels cold all the time.   Pt has started coughing which can take her breath, no sputum. Nose runs clear water trying to eat or drink 75% or more of the time.   Currently on eliquis 2.5 mg BID by cardiology. No hemoptysis.  Denies any worsening swelling of the legs.  Patient states that she is compliant with her oral iron.  No constipation.  Review of Systems  Constitutional:  Positive for malaise/fatigue. Negative for chills, diaphoresis and fever.  HENT:  Negative for nosebleeds and sore throat.   Eyes:  Negative for double vision.  Respiratory:  Negative for hemoptysis, sputum production and wheezing.   Cardiovascular:  Negative for chest pain, palpitations, orthopnea and leg swelling.  Gastrointestinal:  Negative for blood in stool, constipation, melena, nausea and vomiting.  Genitourinary:  Negative for dysuria, frequency and urgency.  Musculoskeletal:  Positive for back pain and joint pain.  Neurological:  Positive for focal weakness. Negative for dizziness, tingling, weakness and headaches.  Endo/Heme/Allergies:  Bruises/bleeds easily.  Psychiatric/Behavioral:  Negative for depression. The patient is not nervous/anxious and does not have insomnia.       PAST MEDICAL HISTORY :  Past Medical History:  Diagnosis Date   Abdominal pain    Acid reflux 03/24/2015  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   Allergic genetic state    Anemia    Arteriosclerosis of coronary artery 03/24/2015   Arthritis    "back, hands" (10/15/2017)   Asthma    B12 deficiency anemia    Basal cell carcinoma 01/01/2022   glabella, [facility]    Basal cell carcinoma 05/03/2022   R ant deltoid, [facility]   Basal cell carcinoma 05/03/2022   Xyphoid excised 06/12/2022   Bile reflux gastritis    CAD (coronary artery disease)    Cancer of right lung (HCC) 05/09/2015   Dr. Thelma Barge performed Right lower lobe lobectomy.    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 08/03/2016   Carotid stenosis 02/07/2016   Cataract cortical, senile    Chest pain    Colon polyp    Degeneration of intervertebral disc of lumbar region 03/11/2014   Diabetes (HCC)    Gastric adenocarcinoma (HCC)    GERD (gastroesophageal reflux disease)    also, history of ulcers   History of kidney stones    History of stroke    HLD (hyperlipidemia) 08/24/2013   Hypertension    Malignant tumor of stomach (HCC) 07/2016   Adenocarcinoma, diffuse, poorly differentiated, signet ring, stage I   Neuritis or radiculitis due to rupture of lumbar intervertebral disc 09/10/2014   Neuroendocrine tumor 03/24/2015   Neuroma    Osteoporosis    Peripheral vascular disease (HCC)    PONV (postoperative nausea and vomiting)    Presence of permanent cardiac pacemaker 12/09/2018   Primary malignant neoplasm of right lower lobe of lung (HCC) 12/02/2015   Renal insufficiency    Renal stone    S/P TAVR (transcatheter aortic valve replacement)    Sciatica    Severe aortic stenosis    Skin cancer    "cut/burned LUE; cut off right eye/nose & cut off chest" (10/15/2017)   Stomach ulcer    Thrombocytopenia (HCC)    Type 2 diabetes, diet controlled (HCC)    "no RX since stomach OR 07/2016" (10/15/2017)    PAST SURGICAL HISTORY :   Past Surgical History:  Procedure Laterality Date   APPENDECTOMY     CARDIAC CATHETERIZATION  X2 before 10/15/2017   CARDIAC VALVE REPLACEMENT     CATARACT EXTRACTION W/PHACO Left 04/04/2017   Procedure: CATARACT EXTRACTION PHACO AND INTRAOCULAR LENS PLACEMENT (IOC);  Surgeon: Lockie Mola, MD;  Location: [facility] ORS;  Service: Ophthalmology;  Laterality: Left;  Lot #  [id number] H [place] 1:00 Ap 25% CDE 8.54   CATARACT EXTRACTION W/PHACO Right 05/15/2017   Procedure: CATARACT EXTRACTION PHACO AND INTRAOCULAR LENS PLACEMENT (IOC);  Surgeon: Lockie Mola, MD;  Location: [facility];  Service: Ophthalmology;  Laterality: Right;  [place] 01:10 AP% 18.3 CDE 12.91 Fluid pack lot # [id number]   COLONOSCOPY     CORONARY ATHERECTOMY N/A 10/15/2017   Procedure: CORONARY ATHERECTOMY;  Surgeon: Kathleene Hazel, MD;  [facility]: [facility];  Service: Cardiovascular;  Laterality: N/A;   CORONARY STENT INTERVENTION N/A 10/15/2017   Procedure: CORONARY STENT INTERVENTION;  Surgeon: Kathleene Hazel, MD;  Location: [facility];  Service: Cardiovascular;  Laterality: N/A;   CYSTOSCOPY/URETEROSCOPY/HOLMIUM LASER/STENT PLACEMENT Left 08/03/2019   Procedure: CYSTOSCOPY/URETEROSCOPY/HOLMIUM LASER/STENT PLACEMENT;  Surgeon: Vanna Scotland, MD;  Location: [facility];  Service: Urology;  Laterality: Left;   ESOPHAGOGASTRODUODENOSCOPY     ESOPHAGOGASTRODUODENOSCOPY (EGD) WITH PROPOFOL N/A 03/27/2016   Procedure: ESOPHAGOGASTRODUODENOSCOPY (EGD) WITH PROPOFOL;  Surgeon: Christena Deem, MD;  Location[facility] ENDOSCOPY;  Service: Endoscopy;  Laterality: N/A;   ESOPHAGOGASTRODUODENOSCOPY (EGD) WITH PROPOFOL N/A 05/28/2016   Procedure: ESOPHAGOGASTRODUODENOSCOPY (EGD) WITH PROPOFOL;  Surgeon: Christena Deem, MD;  Location: [facility] ENDOSCOPY;  Service: Endoscopy;  Laterality: N/A;   ESOPHAGOGASTRODUODENOSCOPY (EGD) WITH PROPOFOL N/A 09/25/2016   Procedure: ESOPHAGOGASTRODUODENOSCOPY (EGD) WITH PROPOFOL;  Surgeon: Kieth Brightly, MD;  Location: [facility] ENDOSCOPY;  Service: Endoscopy;  Laterality: N/A;   ESOPHAGOGASTRODUODENOSCOPY (EGD) WITH PROPOFOL N/A 12/18/2016   Procedure: ESOPHAGOGASTRODUODENOSCOPY (EGD) WITH PROPOFOL;  Surgeon: Kieth Brightly, MD;  Location: [facility] ENDOSCOPY;  Service: Endoscopy;  Laterality: N/A;   ESOPHAGOGASTRODUODENOSCOPY (EGD) WITH  PROPOFOL N/A 01/23/2017   Procedure: ESOPHAGOGASTRODUODENOSCOPY (EGD) WITH PROPOFOL;  Surgeon: Kieth Brightly, MD;  Location: [facility] ENDOSCOPY;  Service: Endoscopy;  Laterality: N/A;   ESOPHAGOGASTRODUODENOSCOPY (EGD) WITH PROPOFOL N/A 03/20/2017   Procedure: ESOPHAGOGASTRODUODENOSCOPY (EGD) WITH PROPOFOL;  Surgeon: Kieth Brightly, MD;  Location: [facility] ENDOSCOPY;  Service: Endoscopy;  Laterality: N/A;   ESOPHAGOGASTRODUODENOSCOPY (EGD) WITH PROPOFOL N/A 09/23/2020   Procedure: ESOPHAGOGASTRODUODENOSCOPY (EGD) WITH PROPOFOL;  Surgeon: Regis Bill, MD;  Location: [facility] ENDOSCOPY;  Service: Endoscopy;  Laterality: N/A;  [facility] SAYS "AMPICILLIN IS NEEDED"   FRACTURE SURGERY     INSERT / REPLACE / REMOVE PACEMAKER  12/09/2018   PACEMAKER LEADLESS INSERTION N/A 12/09/2018   Procedure: PACEMAKER LEADLESS INSERTION;  Surgeon: Hillis Range, MD;  [facility];  Service: Cardiovascular;  Laterality: N/A;   PARTIAL GASTRECTOMY N/A 08/03/2016   Hemigastrectomy, Billroth I reconstruction Surgeon: Kieth Brightly, MD;  Location: [facility];  Service: General;  Laterality: N/A;   RIGHT/LEFT HEART CATH AND CORONARY ANGIOGRAPHY Bilateral 09/19/2017   Procedure: RIGHT/LEFT HEART CATH AND CORONARY ANGIOGRAPHY;  Surgeon: Alwyn Pea, MD;  Location: [facility];  Service: Cardiovascular;  Laterality: Bilateral;   SHOULDER ARTHROSCOPY W/ CAPSULAR REPAIR Right    SKIN CANCER EXCISION     "cut/burned LUE; cut off right eye/nose & cut off chest" (10/15/2017)   TEE WITHOUT CARDIOVERSION N/A 12/02/2018   Procedure: TRANSESOPHAGEAL ECHOCARDIOGRAM (TEE);  Surgeon: Kathleene Hazel, MD;  Location[facility]IVE CV LAB;  Service: Open Heart Surgery;  Laterality: N/A;   THORACOTOMY Right 05/09/2015   Procedure: THORACOTOMY, RIGHT LOWER LOBECTOMY, BRONCHOSCOPY;  Surgeon: Hulda Marin, MD;  Location: [facility];  Service: Thoracic;  Laterality: Right;   TONSILLECTOMY  1944    TRANSCATHETER AORTIC VALVE REPLACEMENT, TRANSFEMORAL N/A 12/02/2018   Procedure: TRANSCATHETER AORTIC VALVE REPLACEMENT, TRANSFEMORAL;  Surgeon: Kathleene Hazel, MD;  [facility];  Service: Open Heart Surgery;  Laterality: N/A;   TUBAL LIGATION     UPPER GI ENDOSCOPY N/A 08/03/2016   Procedure: UPPER  ENDOSCOPY;  Surgeon: Kieth Brightly, MD;  Location: [facility] OR[facility];  Service: General;  Laterality: N/A;   VAGINAL HYSTERECTOMY     WRIST FRACTURE SURGERY Right     FAMILY HISTORY :   Family History  Problem Relation Age of Onset   Aortic aneurysm Mother    Kidney Stones Mother    Hypertension Mother    Hypertension Father    Diabetes Other    Breast cancer Neg Hx    Prostate cancer Neg Hx    Bladder Cancer Neg Hx    Kidney cancer Neg Hx     SOCIAL HISTORY:   Social History   Tobacco Use   Smoking status: Never   Smokeless tobacco: Never  Vaping Use   Vaping status: Never Used  Substance Use Topics   Alcohol use: Not Currently  Drug use: Never    ALLERGIES:  is allergic to mirtazapine, pneumococcal vaccine, statins, and sulfa antibiotics.  MEDICATIONS:  Current Outpatient Medications  Medication Sig Dispense Refill   apixaban (ELIQUIS) 2.5 MG TABS tablet Take 1 tablet (2.5 mg total) by mouth 2 (two) times daily. 180 tablet 1   cholecalciferol (VITAMIN D) 1000 UNITS tablet Take 1,000 Units by mouth daily.     cyanocobalamin 1000 MCG tablet Take 1,000 mcg by mouth daily.      eltrombopag (PROMACTA) 25 MG tablet Take 1 tablet (25 mg total) by mouth daily. Take on an empty stomach, 1 hour before a meal or 2 hours after. 30 tablet 2   ezetimibe (ZETIA) 10 MG tablet Take 1 tablet (10 mg total) by mouth daily. 90 tablet 3   ferrous sulfate 325 (65 FE) MG tablet Take 325 mg by mouth daily with breakfast.     isosorbide mononitrate (IMDUR) 30 MG 24 hr tablet Take 1 tablet (30 mg total) by mouth daily. 90 tablet 3   levofloxacin (LEVAQUIN) 250 MG tablet  Take 1 tablet (250 mg total) by mouth daily. 7 tablet 0   Magnesium 100 MG TABS Take 1 tablet by mouth daily at 6 (six) AM.     metoprolol succinate (TOPROL-XL) 50 MG 24 hr tablet Take 1 tablet (50 mg total) by mouth every evening. Take with or immediately following a meal. 30 tablet 0   osimertinib mesylate (TAGRISSO) 80 MG tablet Take 1 tablet (80 mg total) by mouth daily. 30 tablet 1   potassium citrate (UROCIT-K) 10 MEQ (1080 MG) SR tablet Take 1 tablet by mouth once daily 90 tablet 0   No current facility-administered medications for this visit.    PHYSICAL EXAMINATION: ECOG PERFORMANCE STATUS: 0 - Asymptomatic  BP (!) 154/56 (BP Location: Left Arm, Patient Position: Sitting, Cuff Size: Normal)   Pulse 64   Temp (!) 96.6 F (35.9 C) (Tympanic)   Resp 16   Ht 5\' 1"  (1.549 m)   Wt 100 lb 6.4 oz (45.5 kg)   SpO2 100%   BMI 18.97 kg/m   Filed Weights   07/22/23 1421  Weight: 100 lb 6.4 oz (45.5 kg)      Decreased breath sounds on the right side compared to left.  Physical Exam Constitutional:      Comments: Patient walking by herself; no assistive devices.  Alone.  HENT:     Head: Normocephalic and atraumatic.     Mouth/Throat:     Pharynx: No oropharyngeal exudate.  Eyes:     Pupils: Pupils are equal, round, and reactive to light.  Cardiovascular:     Rate and Rhythm: Normal rate and regular rhythm.     Heart sounds: Murmur heard.  Pulmonary:     Effort: No respiratory distress.     Breath sounds: No wheezing.  Abdominal:     General: Bowel sounds are normal. There is no distension.     Palpations: Abdomen is soft. There is no mass.     Tenderness: There is no abdominal tenderness. There is no guarding or rebound.  Musculoskeletal:        General: No tenderness. Normal range of motion.     Cervical back: Normal range of motion and neck supple.  Skin:    General: Skin is warm.  Neurological:     Mental Status: She is alert and oriented to person, place, and  time.     Comments: Weakness of the right upper extremity  more than lower extremity.  Psychiatric:        Mood and Affect: Affect normal.    LABORATORY DATA:  I have reviewed the data as listed    Component Value Date/Time   NA 134 (L) 07/22/2023 1413   NA 141 02/05/2022 1405   NA 141 07/21/2012 1529   K 4.5 07/22/2023 1413   K 3.9 07/21/2012 1529   CL 99 07/22/2023 1413   CL 109 (H) 07/21/2012 1529   CO2 26 07/22/2023 1413   CO2 27 07/21/2012 1529   GLUCOSE 84 07/22/2023 1413   GLUCOSE 104 (H) 07/21/2012 1529   BUN 30 (H) 07/22/2023 1413   BUN 34 (H) 02/05/2022 1405   BUN 16 07/21/2012 1529   CREATININE 1.62 (H) 07/22/2023 1413   CREATININE 1.11 09/03/2012 1535   CALCIUM 8.9 07/22/2023 1413   CALCIUM 8.9 07/21/2012 1529   PROT 6.5 07/22/2023 1413   PROT 7.1 07/21/2012 1529   ALBUMIN 3.7 07/22/2023 1413   ALBUMIN 3.9 07/21/2012 1529   AST 17 07/22/2023 1413   ALT 9 07/22/2023 1413   ALT 22 07/21/2012 1529   ALKPHOS 79 07/22/2023 1413   ALKPHOS 76 07/21/2012 1529   BILITOT 1.1 07/22/2023 1413   GFRNONAA 31 (L) 07/22/2023 1413   GFRNONAA 49 (L) 09/03/2012 1535   GFRAA 29 (L) 01/05/2020 1440   GFRAA 57 (L) 09/03/2012 1535    No results found for: "SPEP", "UPEP"  Lab Results  Component Value Date   WBC 5.1 07/22/2023   NEUTROABS 3.7 07/22/2023   HGB 11.5 (L) 07/22/2023   HCT 35.1 (L) 07/22/2023   MCV 91.9 07/22/2023   PLT 138 (L) 07/22/2023      Chemistry      Component Value Date/Time   NA 134 (L) 07/22/2023 1413   NA 141 02/05/2022 1405   NA 141 07/21/2012 1529   K 4.5 07/22/2023 1413   K 3.9 07/21/2012 1529   CL 99 07/22/2023 1413   CL 109 (H) 07/21/2012 1529   CO2 26 07/22/2023 1413   CO2 27 07/21/2012 1529   BUN 30 (H) 07/22/2023 1413   BUN 34 (H) 02/05/2022 1405   BUN 16 07/21/2012 1529   CREATININE 1.62 (H) 07/22/2023 1413   CREATININE 1.11 09/03/2012 1535      Component Value Date/Time   CALCIUM 8.9 07/22/2023 1413   CALCIUM 8.9  07/21/2012 1529   ALKPHOS 79 07/22/2023 1413   ALKPHOS 76 07/21/2012 1529   AST 17 07/22/2023 1413   ALT 9 07/22/2023 1413   ALT 22 07/21/2012 1529   BILITOT 1.1 07/22/2023 1413       RADIOGRAPHIC STUDIES: I have personally reviewed the radiological images as listed and agreed with the findings in the report. No results found.   ASSESSMENT & PLAN:  Primary malignant neoplasm of right lower lobe of lung (HCC) # Stage IV recurrent adenocarcinoma the lung; EGFR mutated. On osimertinib 80 mg [Feb 6th 2020]. 07/08/2023- CT Chest- NON- contrast- Interval development of extensive interstitial and ground-glass pulmonary infiltrate within the right upper lobe which, if acute may reflect changes of atypical infection or, if chronic, can reflect changes of recurrent lepidic bronchial adenocarcinoma given its unilaterality.Increasing paratracheal soft tissue infiltration surrounding the distal trachea and central mainstem bronchi which is of unclear  significance though infiltrative disease is not excluded. PET CT Examination  is recommended.  Status post right lower lobectomy with stable loculated right pleural effusion and associated pleural thickening.   # currently  on Osimertinib 80 mg/day; tolerating well-however based on above recent imaging concerning for progression.  Discussed with Dr. Aleskerov-for consideration of biopsy.  Await PET scan.   #Right pleural effusion-status postthoracentesis-pleural fluid: Transudative; cytology negative for malignancy.  Question CHF/fluid overload.  S/p Lasix 20 mg a day- improved/stable. Currently not on any lasix [ARF].  STABLE.    # Chronic anemia- Hb 10-18 Dec 2021- Iron sat- 22%; ferritin- 137- continue Iron pills once day. Hold off Venofer- STABLE.   # ITP- chronic- platelets> 100 on promacta 25 mg/day- STABLE.   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 STABLE.   # A-fib [feb 2024] on Eliquis 2.5/cardiology.  STABLE.   # right sided stroke/ seizures-on asprin; off plavix;; f/u neurology-[place]-  STABLE.   # CKD- stage IV; Left Hydronephrosis s/p ureteral stent placement/nephrolithiasis-~ GFR 34 today; Hypokalemia-3.4  continue K-citrate q day. STABLE.   #Incidental findings on Imaging  Chest CT , 2025:   Extensive coronary artery calcification.  Stable cardiomegaly; Aortic Atherosclerosis  I reviewed/discussed/counseled the patient.   # IV ACCESS: PIV  # DISPOSITION: # no venofer today # follow up in 3  months- MD: labs- cbc/cmp;iron studies; ferritin;LDH; possible IV venofer; CT chest non- contrast  Dr.B         No orders of the defined types were placed in this encounter.  All questions were answered. The patient knows to call the clinic with any problems, questions or concerns.      Gwyn Leos, MD 07/22/2023 2:56 PM

## 2023-07-22 NOTE — Assessment & Plan Note (Addendum)
#   Stage IV recurrent adenocarcinoma the lung; EGFR mutated. On osimertinib 80 mg [Feb 6th 2020]. 07/08/2023- CT Chest- NON- contrast- Interval development of extensive interstitial and ground-glass pulmonary infiltrate within the right upper lobe which, if acute may reflect changes of atypical infection or, if chronic, can reflect changes of recurrent lepidic bronchial adenocarcinoma given its unilaterality.Increasing paratracheal soft tissue infiltration surrounding the distal trachea and central mainstem bronchi which is of unclear  significance though infiltrative disease is not excluded. PET CT Examination  is recommended.  Status post right lower lobectomy with stable loculated right pleural effusion and associated pleural thickening.   # currently on Osimertinib 80 mg/day; tolerating well-however based on above recent imaging concerning for progression.  Discussed with Dr. Aleskerov-for consideration of biopsy/ draining the effusion.  Await PET scan.   #Right pleural effusion-status postthoracentesis-pleural fluid: Transudative; cytology negative for malignancy.  Question CHF/fluid overload.  S/p Lasix 20 mg a day- improved/stable. Currently not on any lasix [ARF].  STABLE.    # Chronic anemia- Hb 10-18 Dec 2021- Iron sat- 22%; ferritin- 137- continue Iron pills once day. Hold off Venofer- STABLE.   # ITP- chronic- platelets> 100 on promacta 25 mg/day- STABLE.   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 STABLE.   # A-fib [feb 2024] on Eliquis 2.5/cardiology. STABLE.   # right sided stroke/ seizures-on asprin; off plavix;; f/u neurology-[place]-  stable  # CKD- stage IV; Left Hydronephrosis s/p ureteral stent placement/nephrolithiasis-~ GFR 34 today; Hypokalemia-3.4  continue K-citrate q day.  stable  #Incidental findings on Imaging  Chest CT , 2025:   Extensive coronary artery calcification.  Stable cardiomegaly; Aortic Atherosclerosis  I  reviewed/discussed/counseled the patient.   # IV ACCESS: PIV  # DISPOSITION: # no venofer today # PET ASAP # follow up TBD-  Dr.B

## 2023-07-22 NOTE — Progress Notes (Unsigned)
 CT chest 07/08/23. Started levaquin yesterday.  Fatigue/weakness: YES  Dyspena: YES WITH ACTIVITY Light headedness: NO Blood in stool: NO  No appetite, 1 ensure per day. Should she drink more?  C/o coughing off and on x2-3 weeks.

## 2023-07-23 ENCOUNTER — Encounter: Payer: Self-pay | Admitting: Internal Medicine

## 2023-07-23 DIAGNOSIS — J90 Pleural effusion, not elsewhere classified: Secondary | ICD-10-CM | POA: Diagnosis not present

## 2023-07-23 DIAGNOSIS — C801 Malignant (primary) neoplasm, unspecified: Secondary | ICD-10-CM | POA: Diagnosis not present

## 2023-07-24 ENCOUNTER — Ambulatory Visit
Admission: RE | Admit: 2023-07-24 | Discharge: 2023-07-24 | Disposition: A | Discharge status: Home / Self Care | Source: Ambulatory Visit | Attending: Internal Medicine | Admitting: Internal Medicine

## 2023-07-24 DIAGNOSIS — C3431 Malignant neoplasm of lower lobe, right bronchus or lung: Secondary | ICD-10-CM | POA: Diagnosis not present

## 2023-07-24 DIAGNOSIS — I517 Cardiomegaly: Secondary | ICD-10-CM | POA: Insufficient documentation

## 2023-07-24 DIAGNOSIS — I700 Atherosclerosis of aorta: Secondary | ICD-10-CM | POA: Insufficient documentation

## 2023-07-24 DIAGNOSIS — M51369 Other intervertebral disc degeneration, lumbar region without mention of lumbar back pain or lower extremity pain: Secondary | ICD-10-CM | POA: Diagnosis not present

## 2023-07-24 DIAGNOSIS — C3411 Malignant neoplasm of upper lobe, right bronchus or lung: Secondary | ICD-10-CM | POA: Diagnosis not present

## 2023-07-24 DIAGNOSIS — M4316 Spondylolisthesis, lumbar region: Secondary | ICD-10-CM | POA: Insufficient documentation

## 2023-07-24 LAB — GLUCOSE, CAPILLARY: Glucose-Capillary: 86 mg/dL (ref 70–99)

## 2023-07-24 MED ORDER — FLUDEOXYGLUCOSE F - 18 (FDG) INJECTION
5.2000 | Freq: Once | INTRAVENOUS | Status: AC | PRN
  Administered 2023-07-24: 5.68 via INTRAVENOUS

## 2023-07-25 ENCOUNTER — Encounter

## 2023-07-30 ENCOUNTER — Inpatient Hospital Stay: Admitting: Dietician

## 2023-07-30 NOTE — Progress Notes (Signed)
 Nutrition Assessment   Reason for Assessment: MST screen for weight loss.    ASSESSMENT: Patient is a 88 year old female with primary malignant neoplasm of right lower lobe of lung  and medical history significant of DM, thrombocytopenia, aortic stenosis, s/p TVAR 8/25, osteoporosis, neuroendocrine tumor, neuritis, radiculitis d/t rupture of intervertebral disc, HTN, HLD, carotid stenosis, B12 deficiency, carcinoma in situ of stomach, CAD, anemia, recent left frontal pontine stroke.  Currently on osimertinib   and followed by Dr. Valentine Gasmen.  Patient reports no appetite, was on a fluid pill for 4-5 days and took herself off the meds for past 3 days and feeling a bit better.  She relays she is picky eater, has little appetite, and only like limited foods. Regular Ensure and toast,hard-boiled egg Rice with cornbread, or peanut butter crackers Fluids: tea and orang juice, not much water  Anthropometrics: weight loss 8# (8#) past 6 months  Height: 61" Weight: 100.4# UBW: flutuates BMI: 18.97    NUTRITION DIAGNOSIS: Inadequate PO intake to meet increased nutrient needs, r/t cancer diagnosis   INTERVENTION:  Encouraged patient to reach out and reports discontinued meds to the doctor who prescribed it.  Relayed that nutrition services are wrap around service provided at no charge and encouraged continued communication if experiencing continued weight loss or any nutritional impact symptoms (NIS).  Educated on importance of adequate calorie and protein energy intake  with nutrient dense foods when possible to maintain weight/strength  Encouraged small frequent feeds and trying to eat 6 small meals Suggested switching to 350 calorie oral nutrition supplement BID, encouraged getting coupons from cancer center. Discussed strategies for hydrating with higher calorie options, CIB, yogurts, puddings Emailed Nutrition Tip sheet  for  High Protein High Calorie Snacking with contact information  provided   MONITORING, EVALUATION, GOAL: weight, PO intake, Nutrition Impact Symptoms, labs Goal is weight maintenance  Next Visit: remote next month   Carleen Chary, RDN, LDN Registered Dietitian, [facility] Part Time Remote (Usual office hours: Tuesday-Thursday) Cell: [phone]

## 2023-07-31 ENCOUNTER — Other Ambulatory Visit: Payer: Self-pay | Admitting: Internal Medicine

## 2023-07-31 ENCOUNTER — Other Ambulatory Visit: Payer: Self-pay

## 2023-07-31 DIAGNOSIS — C3431 Malignant neoplasm of lower lobe, right bronchus or lung: Secondary | ICD-10-CM

## 2023-07-31 NOTE — Progress Notes (Signed)
 Specialty Pharmacy Refill Coordination Note  Stacy Cook is a 88 y.o. female contacted today regarding refills of specialty medication(s) Eltrombopag  Olamine (PROMACTA ); Osimertinib  Mesylate (TAGRISSO )   Patient requested Delivery   Delivery date: 08/06/23   Verified address: [address]   [place], [place]   Medication will be filled on 08/05/23.

## 2023-07-31 NOTE — Progress Notes (Signed)
 Specialty Pharmacy Ongoing Clinical Assessment Note  Stacy Cook is a 88 y.o. female who is being followed by the specialty pharmacy service for RxSp Oncology   Patient's specialty medication(s) reviewed today: Eltrombopag  Olamine (PROMACTA ); Osimertinib  Mesylate (TAGRISSO )   Missed doses in the last 4 weeks: 0   Patient/Caregiver did not have any additional questions or concerns.   Therapeutic benefit summary: Patient is achieving benefit   Adverse events/side effects summary: No adverse events/side effects   Patient's therapy is appropriate to: Continue    Goals Addressed             This Visit's Progress    Slow Disease Progression       Patient is on track. Patient will maintain adherence. Per provider note from 4/14, ITP remains stable but recent CT showed possible signs of progression and PET scan results pending.          Follow up:  3 months  Yolani Vo M Shawnta Zimbelman Specialty Pharmacist

## 2023-08-01 ENCOUNTER — Other Ambulatory Visit: Payer: Self-pay

## 2023-08-01 ENCOUNTER — Other Ambulatory Visit (HOSPITAL_COMMUNITY): Payer: Self-pay

## 2023-08-01 ENCOUNTER — Encounter: Payer: Self-pay | Admitting: Internal Medicine

## 2023-08-01 MED ORDER — OSIMERTINIB MESYLATE 80 MG PO TABS
80.0000 mg | ORAL_TABLET | Freq: Every day | ORAL | 1 refills | Status: DC
  Filled 2023-08-01: qty 30, 30d supply, fill #0
  Filled 2023-09-03: qty 30, 30d supply, fill #1

## 2023-08-13 NOTE — Addendum Note (Signed)
 Addended by: Lott Rouleau A on: 08/13/2023 05:11 PM   Modules accepted: Orders

## 2023-08-13 NOTE — Progress Notes (Signed)
 Remote pacemaker transmission.

## 2023-08-19 DIAGNOSIS — R0600 Dyspnea, unspecified: Secondary | ICD-10-CM | POA: Diagnosis not present

## 2023-08-19 DIAGNOSIS — R0689 Other abnormalities of breathing: Secondary | ICD-10-CM | POA: Diagnosis not present

## 2023-08-19 DIAGNOSIS — J90 Pleural effusion, not elsewhere classified: Secondary | ICD-10-CM | POA: Diagnosis not present

## 2023-08-21 ENCOUNTER — Other Ambulatory Visit: Payer: Self-pay

## 2023-08-22 ENCOUNTER — Ambulatory Visit: Payer: Medicare PPO | Admitting: Dermatology

## 2023-08-22 DIAGNOSIS — Z85828 Personal history of other malignant neoplasm of skin: Secondary | ICD-10-CM | POA: Diagnosis not present

## 2023-08-22 DIAGNOSIS — L814 Other melanin hyperpigmentation: Secondary | ICD-10-CM

## 2023-08-22 DIAGNOSIS — Z1283 Encounter for screening for malignant neoplasm of skin: Secondary | ICD-10-CM | POA: Diagnosis not present

## 2023-08-22 DIAGNOSIS — L820 Inflamed seborrheic keratosis: Secondary | ICD-10-CM | POA: Diagnosis not present

## 2023-08-22 DIAGNOSIS — L821 Other seborrheic keratosis: Secondary | ICD-10-CM | POA: Diagnosis not present

## 2023-08-22 DIAGNOSIS — L578 Other skin changes due to chronic exposure to nonionizing radiation: Secondary | ICD-10-CM | POA: Diagnosis not present

## 2023-08-22 DIAGNOSIS — L570 Actinic keratosis: Secondary | ICD-10-CM | POA: Diagnosis not present

## 2023-08-22 DIAGNOSIS — D1801 Hemangioma of skin and subcutaneous tissue: Secondary | ICD-10-CM

## 2023-08-22 DIAGNOSIS — W908XXA Exposure to other nonionizing radiation, initial encounter: Secondary | ICD-10-CM | POA: Diagnosis not present

## 2023-08-22 DIAGNOSIS — D229 Melanocytic nevi, unspecified: Secondary | ICD-10-CM

## 2023-08-22 NOTE — Patient Instructions (Addendum)

## 2023-08-22 NOTE — Progress Notes (Signed)
 Follow-Up Visit   Subjective  Stacy Cook is a 88 y.o. female who presents for the following: Skin Cancer Screening and Full Body Skin Exam, hx of BCC  The patient presents for Total-Body Skin Exam (TBSE) for skin cancer screening and mole check. The patient has spots, moles and lesions to be evaluated, some may be new or changing and the patient may have concern these could be cancer.    The following portions of the chart were reviewed this encounter and updated as appropriate: medications, allergies, medical history  Review of Systems:  No other skin or systemic complaints except as noted in HPI or Assessment and Plan.  Objective  Well appearing patient in no apparent distress; mood and affect are within normal limits.  A full examination was performed including scalp, head, eyes, ears, nose, lips, neck, chest, axillae, abdomen, back, buttocks, bilateral upper extremities, bilateral lower extremities, hands, feet, fingers, toes, fingernails, and toenails. All findings within normal limits unless otherwise noted below.   Relevant physical exam findings are noted in the Assessment and Plan.  dorsum nose x 1, scalp x 1, right upper lip x 1 (3) Erythematous thin papules/macules with gritty scale.  face x 6, trunk x 12, left lower leg x 1 (19) Stuck-on, waxy, tan-brown papules and plaques -- Discussed benign etiology and prognosis.   Assessment & Plan   SKIN CANCER SCREENING PERFORMED TODAY.  LENTIGINES, SEBORRHEIC KERATOSES, HEMANGIOMAS - Benign normal skin lesions - Benign-appearing - Call for any changes  MELANOCYTIC NEVI - Tan-brown and/or pink-flesh-colored symmetric macules and papules - Benign appearing on exam today - Observation - Call clinic for new or changing moles - Recommend daily use of broad spectrum spf 30+ sunscreen to sun-exposed areas.   HISTORY OF BASAL CELL CARCINOMA OF THE SKIN - No evidence of recurrence today - Recommend regular full body skin  exams - Recommend daily broad spectrum sunscreen SPF 30+ to sun-exposed areas, reapply every 2 hours as needed.  - Call if any new or changing lesions are noted between office visits   AK (ACTINIC KERATOSIS) (3) dorsum nose x 1, scalp x 1, right upper lip x 1 (3) ACTINIC DAMAGE - chronic, secondary to cumulative UV radiation exposure/sun exposure over time - diffuse scaly erythematous macules with underlying dyspigmentation - Recommend daily broad spectrum sunscreen SPF 30+ to sun-exposed areas, reapply every 2 hours as needed.  - Recommend staying in the shade or wearing long sleeves, sun glasses (UVA+UVB protection) and wide brim hats (4-inch brim around the entire circumference of the hat). - Call for new or changing lesions.  Destruction of lesion - dorsum nose x 1, scalp x 1, right upper lip x 1 (3) Complexity: simple   Destruction method: cryotherapy   Informed consent: discussed and consent obtained   Timeout:  patient name, date of birth, surgical site, and procedure verified Lesion destroyed using liquid nitrogen: Yes   Region frozen until ice ball extended beyond lesion: Yes   Outcome: patient tolerated procedure well with no complications   Post-procedure details: wound care instructions given   INFLAMED SEBORRHEIC KERATOSIS (19) face x 6, trunk x 12, left lower leg x 1 (19) Symptomatic, irritating, patient would like treated.  Destruction of lesion - face x 6, trunk x 12, left lower leg x 1 (19) Complexity: simple   Destruction method: cryotherapy   Informed consent: discussed and consent obtained   Timeout:  patient name, date of birth, surgical site, and procedure verified Lesion destroyed using  liquid nitrogen: Yes   Region frozen until ice ball extended beyond lesion: Yes   Outcome: patient tolerated procedure well with no complications   Post-procedure details: wound care instructions given     Return in about 1 year (around 08/21/2024) for TBSE, hx of BCC.  IClara Crisp, CMA, am acting as scribe for Celine Collard, MD .   Documentation: I have reviewed the above documentation for accuracy and completeness, and I agree with the above.  Celine Collard, MD

## 2023-08-26 ENCOUNTER — Encounter: Payer: Self-pay | Admitting: Dermatology

## 2023-08-27 ENCOUNTER — Inpatient Hospital Stay: Attending: Internal Medicine | Admitting: Dietician

## 2023-08-27 ENCOUNTER — Encounter (INDEPENDENT_AMBULATORY_CARE_PROVIDER_SITE_OTHER): Payer: Self-pay

## 2023-08-27 NOTE — Progress Notes (Signed)
 Nutrition Follow Up:  Reached out to patient at her home telephone#.  Patient reports since she took herself off the diuretic meds she is feeling better, eating better and moving more.  She currently taking ONS BID.  She uses a regular Ensure in the morning, and Ensure Plus at HS. Little change with her PO she repeated that she is picky eater.  Anthropometrics: Patient reports she has regained her weight after previous weight loss 8# (8#) past 6 months  Height: 61" Weight:  Reports she was down to 98# Weighed at home a few days ago 104#, no new weights in EMR 07/22/23  100.4# UBW: fluctuates BMI: 18.97    NUTRITION DIAGNOSIS: Inadequate PO intake to meet increased nutrient needs, r/t cancer diagnosis. Per patient, resolving with ONS BID   INTERVENTION:  Encouraged patient to continues to weight herself at home.  Slow and steady weight gain 2-4# per month to her DBW 110#. Continue with ONS routine to achieve goal weight. Confirmed she has email with contact information provided   MONITORING, EVALUATION, GOAL: weight, PO intake, Nutrition Impact Symptoms, labs Goal is weight maintenance  Next Visit: PRN at patient or provider request  Carleen Chary, RDN, LDN Registered Dietitian, [facility] Part Time Remote (Usual office hours: Tuesday-Thursday) Cell: [phone]

## 2023-08-30 ENCOUNTER — Other Ambulatory Visit (HOSPITAL_COMMUNITY): Payer: Self-pay

## 2023-09-03 ENCOUNTER — Other Ambulatory Visit: Payer: Self-pay

## 2023-09-03 NOTE — Progress Notes (Signed)
 Specialty Pharmacy Refill Coordination Note  Stacy Cook is a 88 y.o. female contacted today regarding refills of specialty medication(s) Eltrombopag  Olamine (PROMACTA ); Osimertinib  Mesylate (TAGRISSO )   Patient requested Delivery   Delivery date: 09/06/23   Verified address: [address]   [place], [place]   Medication will be filled on 05.29.25.

## 2023-09-04 DIAGNOSIS — B351 Tinea unguium: Secondary | ICD-10-CM | POA: Diagnosis not present

## 2023-09-04 DIAGNOSIS — M79674 Pain in right toe(s): Secondary | ICD-10-CM | POA: Diagnosis not present

## 2023-09-04 DIAGNOSIS — M79675 Pain in left toe(s): Secondary | ICD-10-CM | POA: Diagnosis not present

## 2023-09-05 ENCOUNTER — Other Ambulatory Visit: Payer: Self-pay

## 2023-09-16 ENCOUNTER — Telehealth: Payer: Self-pay

## 2023-09-16 ENCOUNTER — Telehealth: Payer: Self-pay | Admitting: Internal Medicine

## 2023-09-16 NOTE — Telephone Encounter (Signed)
 She had an appt May 8th and rescheduled to May 14th and no showed that appt, per Jacqlyn Matas at [facility].

## 2023-09-16 NOTE — Telephone Encounter (Signed)
 Pt called and stated she needed to make a lab/MD appt. The last appt shows TBD. I asked Felipa Horsfall if she had any messages to schedule pt and she stated no. Please advise, thank you

## 2023-09-17 ENCOUNTER — Other Ambulatory Visit: Payer: Self-pay

## 2023-09-17 DIAGNOSIS — C3431 Malignant neoplasm of lower lobe, right bronchus or lung: Secondary | ICD-10-CM

## 2023-09-17 NOTE — Telephone Encounter (Signed)
 Spoke with patient and reviewed results/recommendations per Dr. Geraldene Kleine. Pt did not have any further questions or needs.

## 2023-09-17 NOTE — Telephone Encounter (Signed)
 Labs entered.

## 2023-10-01 ENCOUNTER — Other Ambulatory Visit: Payer: Self-pay | Admitting: Internal Medicine

## 2023-10-03 ENCOUNTER — Other Ambulatory Visit: Payer: Self-pay | Admitting: Internal Medicine

## 2023-10-03 ENCOUNTER — Other Ambulatory Visit (HOSPITAL_COMMUNITY): Payer: Self-pay

## 2023-10-03 ENCOUNTER — Other Ambulatory Visit: Payer: Self-pay

## 2023-10-03 DIAGNOSIS — C3431 Malignant neoplasm of lower lobe, right bronchus or lung: Secondary | ICD-10-CM

## 2023-10-03 DIAGNOSIS — D693 Immune thrombocytopenic purpura: Secondary | ICD-10-CM

## 2023-10-03 MED ORDER — OSIMERTINIB MESYLATE 80 MG PO TABS
80.0000 mg | ORAL_TABLET | Freq: Every day | ORAL | 1 refills | Status: DC
Start: 2023-10-03 — End: 2023-11-26
  Filled 2023-10-03 – 2023-10-07 (×2): qty 30, 30d supply, fill #0
  Filled 2023-10-31 – 2023-11-01 (×2): qty 30, 30d supply, fill #1

## 2023-10-03 MED ORDER — ELTROMBOPAG OLAMINE 25 MG PO TABS
25.0000 mg | ORAL_TABLET | Freq: Every day | ORAL | 2 refills | Status: DC
  Filled 2023-10-03 – 2023-10-07 (×2): qty 30, 30d supply, fill #0
  Filled 2023-10-31 – 2023-11-01 (×2): qty 30, 30d supply, fill #1
  Filled 2023-11-26: qty 30, 30d supply, fill #2

## 2023-10-04 ENCOUNTER — Ambulatory Visit (INDEPENDENT_AMBULATORY_CARE_PROVIDER_SITE_OTHER): Payer: Medicare PPO

## 2023-10-04 DIAGNOSIS — I442 Atrioventricular block, complete: Secondary | ICD-10-CM

## 2023-10-07 ENCOUNTER — Other Ambulatory Visit: Payer: Self-pay | Admitting: Pharmacy Technician

## 2023-10-07 ENCOUNTER — Other Ambulatory Visit: Payer: Self-pay

## 2023-10-07 LAB — CUP PACEART REMOTE DEVICE CHECK
Battery Remaining Longevity: 44 mo
Battery Voltage: 2.93 V
Brady Statistic AS VP Percent: 79.58 %
Brady Statistic AS VS Percent: 0 %
Brady Statistic RV Percent Paced: 99.5 %
Date Time Interrogation Session: 20250627114900
Implantable Pulse Generator Implant Date: 20200901
Lead Channel Impedance Value: 500 Ohm
Lead Channel Pacing Threshold Amplitude: 0.875 V
Lead Channel Pacing Threshold Pulse Width: 0.24 ms
Lead Channel Sensing Intrinsic Amplitude: 8.888 mV
Lead Channel Setting Pacing Amplitude: 1.375
Lead Channel Setting Pacing Pulse Width: 0.24 ms
Lead Channel Setting Sensing Sensitivity: 2.8 mV

## 2023-10-07 NOTE — Progress Notes (Signed)
 Specialty Pharmacy Refill Coordination Note  Stacy Cook is a 88 y.o. female contacted today regarding refills of specialty medication(s) Eltrombopag  Olamine (PROMACTA ); Osimertinib  Mesylate (TAGRISSO )   Patient requested Delivery   Delivery date: 10/08/23   Verified address: [address]  [place]   Medication will be filled on 10/07/23.

## 2023-10-09 ENCOUNTER — Ambulatory Visit: Payer: Self-pay | Admitting: Cardiology

## 2023-10-16 ENCOUNTER — Other Ambulatory Visit: Payer: Self-pay

## 2023-10-16 NOTE — Progress Notes (Signed)
 Specialty Pharmacy Ongoing Clinical Assessment Note  Stacy Cook is a 88 y.o. female who is being followed by the specialty pharmacy service for RxSp Oncology   Patient's specialty medication(s) reviewed today: Eltrombopag  Olamine (PROMACTA ); Osimertinib  Mesylate (TAGRISSO )   Missed doses in the last 4 weeks: 0   Patient/Caregiver did not have any additional questions or concerns.   Therapeutic benefit summary: Patient is achieving benefit   Adverse events/side effects summary: No adverse events/side effects   Patient's therapy is appropriate to: Continue    Goals Addressed             This Visit's Progress    Slow Disease Progression   On track    Patient is on track. Patient will maintain adherence. Per provider note from 4/14, ITP remains stable but recent CT showed possible signs of progression and PET scan results pending.          Follow up: 3 months  Stacy Cook M [name] Specialty Pharmacist

## 2023-10-18 ENCOUNTER — Inpatient Hospital Stay: Admitting: Internal Medicine

## 2023-10-18 ENCOUNTER — Encounter: Payer: Self-pay | Admitting: Internal Medicine

## 2023-10-18 ENCOUNTER — Inpatient Hospital Stay: Attending: Internal Medicine

## 2023-10-18 DIAGNOSIS — Z7901 Long term (current) use of anticoagulants: Secondary | ICD-10-CM | POA: Diagnosis not present

## 2023-10-18 DIAGNOSIS — D693 Immune thrombocytopenic purpura: Secondary | ICD-10-CM | POA: Diagnosis not present

## 2023-10-18 DIAGNOSIS — E785 Hyperlipidemia, unspecified: Secondary | ICD-10-CM | POA: Diagnosis not present

## 2023-10-18 DIAGNOSIS — I739 Peripheral vascular disease, unspecified: Secondary | ICD-10-CM | POA: Diagnosis not present

## 2023-10-18 DIAGNOSIS — C3431 Malignant neoplasm of lower lobe, right bronchus or lung: Secondary | ICD-10-CM | POA: Diagnosis not present

## 2023-10-18 DIAGNOSIS — I2510 Atherosclerotic heart disease of native coronary artery without angina pectoris: Secondary | ICD-10-CM | POA: Insufficient documentation

## 2023-10-18 DIAGNOSIS — D649 Anemia, unspecified: Secondary | ICD-10-CM | POA: Diagnosis not present

## 2023-10-18 DIAGNOSIS — N184 Chronic kidney disease, stage 4 (severe): Secondary | ICD-10-CM | POA: Diagnosis not present

## 2023-10-18 DIAGNOSIS — Z8673 Personal history of transient ischemic attack (TIA), and cerebral infarction without residual deficits: Secondary | ICD-10-CM | POA: Diagnosis not present

## 2023-10-18 DIAGNOSIS — I129 Hypertensive chronic kidney disease with stage 1 through stage 4 chronic kidney disease, or unspecified chronic kidney disease: Secondary | ICD-10-CM | POA: Insufficient documentation

## 2023-10-18 DIAGNOSIS — E1151 Type 2 diabetes mellitus with diabetic peripheral angiopathy without gangrene: Secondary | ICD-10-CM | POA: Insufficient documentation

## 2023-10-18 DIAGNOSIS — J90 Pleural effusion, not elsewhere classified: Secondary | ICD-10-CM | POA: Insufficient documentation

## 2023-10-18 DIAGNOSIS — Z952 Presence of prosthetic heart valve: Secondary | ICD-10-CM | POA: Diagnosis not present

## 2023-10-18 DIAGNOSIS — Z8719 Personal history of other diseases of the digestive system: Secondary | ICD-10-CM | POA: Insufficient documentation

## 2023-10-18 DIAGNOSIS — R609 Edema, unspecified: Secondary | ICD-10-CM | POA: Diagnosis not present

## 2023-10-18 DIAGNOSIS — I4891 Unspecified atrial fibrillation: Secondary | ICD-10-CM | POA: Diagnosis not present

## 2023-10-18 DIAGNOSIS — M810 Age-related osteoporosis without current pathological fracture: Secondary | ICD-10-CM | POA: Insufficient documentation

## 2023-10-18 DIAGNOSIS — Z87442 Personal history of urinary calculi: Secondary | ICD-10-CM | POA: Diagnosis not present

## 2023-10-18 DIAGNOSIS — E1122 Type 2 diabetes mellitus with diabetic chronic kidney disease: Secondary | ICD-10-CM | POA: Insufficient documentation

## 2023-10-18 DIAGNOSIS — Z85028 Personal history of other malignant neoplasm of stomach: Secondary | ICD-10-CM | POA: Diagnosis not present

## 2023-10-18 DIAGNOSIS — R569 Unspecified convulsions: Secondary | ICD-10-CM | POA: Diagnosis not present

## 2023-10-18 DIAGNOSIS — Z85828 Personal history of other malignant neoplasm of skin: Secondary | ICD-10-CM | POA: Insufficient documentation

## 2023-10-18 DIAGNOSIS — E538 Deficiency of other specified B group vitamins: Secondary | ICD-10-CM | POA: Diagnosis not present

## 2023-10-18 LAB — CBC WITH DIFFERENTIAL (CANCER CENTER ONLY)
Abs Immature Granulocytes: 0.02 K/uL (ref 0.00–0.07)
Basophils Absolute: 0 K/uL (ref 0.0–0.1)
Basophils Relative: 1 %
Eosinophils Absolute: 0.1 K/uL (ref 0.0–0.5)
Eosinophils Relative: 1 %
HCT: 32.4 % — ABNORMAL LOW (ref 36.0–46.0)
Hemoglobin: 10.9 g/dL — ABNORMAL LOW (ref 12.0–15.0)
Immature Granulocytes: 0 %
Lymphocytes Relative: 20 %
Lymphs Abs: 1.1 K/uL (ref 0.7–4.0)
MCH: 30.8 pg (ref 26.0–34.0)
MCHC: 33.6 g/dL (ref 30.0–36.0)
MCV: 91.5 fL (ref 80.0–100.0)
Monocytes Absolute: 0.5 K/uL (ref 0.1–1.0)
Monocytes Relative: 9 %
Neutro Abs: 3.6 K/uL (ref 1.7–7.7)
Neutrophils Relative %: 69 %
Platelet Count: 139 K/uL — ABNORMAL LOW (ref 150–400)
RBC: 3.54 MIL/uL — ABNORMAL LOW (ref 3.87–5.11)
RDW: 13.1 % (ref 11.5–15.5)
WBC Count: 5.2 K/uL (ref 4.0–10.5)
nRBC: 0 % (ref 0.0–0.2)

## 2023-10-18 LAB — CMP (CANCER CENTER ONLY)
ALT: 9 U/L (ref 0–44)
AST: 18 U/L (ref 15–41)
Albumin: 3.6 g/dL (ref 3.5–5.0)
Alkaline Phosphatase: 82 U/L (ref 38–126)
Anion gap: 8 (ref 5–15)
BUN: 30 mg/dL — ABNORMAL HIGH (ref 8–23)
CO2: 24 mmol/L (ref 22–32)
Calcium: 8.8 mg/dL — ABNORMAL LOW (ref 8.9–10.3)
Chloride: 102 mmol/L (ref 98–111)
Creatinine: 1.58 mg/dL — ABNORMAL HIGH (ref 0.44–1.00)
GFR, Estimated: 32 mL/min — ABNORMAL LOW (ref >60)
Glucose, Bld: 112 mg/dL — ABNORMAL HIGH (ref 70–99)
Potassium: 4.2 mmol/L (ref 3.5–5.1)
Sodium: 134 mmol/L — ABNORMAL LOW (ref 135–145)
Total Bilirubin: 0.6 mg/dL (ref 0.0–1.2)
Total Protein: 6.1 g/dL — ABNORMAL LOW (ref 6.5–8.1)

## 2023-10-18 NOTE — Progress Notes (Signed)
 [facility] OFFICE PROGRESS NOTE  Patient Care Team: [name], MD as PCP - General (Internal Medicine) Cindie Ole DASEN, MD as PCP - Electrophysiology (Cardiology) Rennie Cindy SAUNDERS, MD as Consulting Physician (Internal Medicine) Aundria Ladell POUR, MD as Consulting Physician (Gastroenterology) Cindie Ole DASEN, MD as Consulting Physician (Cardiology)   Cancer Staging  Primary malignant neoplasm of right lower lobe of lung [facility]) Staging form: Lung, AJCC 7th Edition - Clinical: No stage assigned - Unsigned    Oncology History Overview Note  # DEC 2017- Adeno ca [GATA; her 2 Neu-NEG]; signet ring [1.5 x3 mm gastric incisura; Dr.Skulskie]; EUS [Dr.Burnbridge]; no significant abnormality noted;  Reviewed at [facility] also. JAN 2018- PET NED. April 2018- S/p partial gastrectomy [Dr.Sankar]- STAGE I ADENO CA; NO adjuvant therapy.  # STAGE I CARCINOID s/p partial gastrectomy   # May 2018- Chronic Atrophic gastritis- Prevpac   # FEB 2017- ADENOCARCINOMA with Lepidic 80%-20% acinar pattern; pT2a [Stage IB;T-2.3cm; visceral pleural invasion present; pN=0 ]; AUG 2017- CT NED;   # DEC 2019- RECURRENT/STAGE IV ADENO LUNG CA- EGFR MUTATED;# Jan 6th 2020-; START Osidemrtinib;  # AUG 2020- SEVERE AS [s/p TAVR; [place]]-complicated by R sided stroke/ seizures/acute renal failure.   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 [omniseq]  # Palliative: O-1/20   DIAGNOSIS:  #ADENO CA LUNG-STAGE IV #Stomach adeno ca [stage I; dec 2017]  GOALS: pallaitive  CURRENT/MOST RECENT THERAPY - OSIMERTINIB  [Jan 6th 2020]      Primary malignant neoplasm of right lower lobe of lung (HCC)  12/02/2015 Initial Diagnosis   Primary malignant neoplasm of right lower lobe of lung (HCC)   Adenocarcinoma of gastric cardia (HCC)   INTERVAL HISTORY: Alone.  Ambulating independantly.   Stacy Cook 88 y.o.  female pleasant patient above  history of recurrent/stage IV adenocarcinoma lung/EGFR mutated currently on osimertinib ; ITP on promacta ; A.fib on eliquis  is here for follow-up-in review results of the PET scan.   No fever no chills.Chronically low fatigue.    Denies any worsening Shortness of breath on exertion. Continues to be Currently on eliquis  2.5 mg BID by cardiology. No hemoptysis.  Denies any worsening swelling of the legs.  Patient states that she is compliant with her oral iron .  No constipation.  Review of Systems  Constitutional:  Positive for malaise/fatigue. Negative for chills, diaphoresis and fever.  HENT:  Negative for nosebleeds and sore throat.   Eyes:  Negative for double vision.  Respiratory:  Negative for hemoptysis, sputum production and wheezing.   Cardiovascular:  Negative for chest pain, palpitations, orthopnea and leg swelling.  Gastrointestinal:  Negative for blood in stool, constipation, melena, nausea and vomiting.  Genitourinary:  Negative for dysuria, frequency and urgency.  Musculoskeletal:  Positive for back pain and joint pain.  Neurological:  Positive for focal weakness. Negative for dizziness, tingling, weakness and headaches.  Endo/Heme/Allergies:  Bruises/bleeds easily.  Psychiatric/Behavioral:  Negative for depression. The patient is not nervous/anxious and does not have insomnia.       PAST MEDICAL HISTORY :  Past Medical History:  Diagnosis Date   Abdominal pain    Acid reflux 03/24/2015   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   Allergic genetic state    Anemia    Arteriosclerosis of coronary artery 03/24/2015   Arthritis    back, hands (10/15/2017)   Asthma    B12 deficiency anemia    Basal cell carcinoma 01/01/2022  glabella, [facility]   Basal cell carcinoma 05/03/2022   R ant deltoid, [facility]   Basal cell carcinoma 05/03/2022   Xyphoid excised 06/12/2022   Bile reflux gastritis    CAD (coronary artery disease)    Cancer of right lung (HCC) 05/09/2015   Dr.  Volney performed Right lower lobe lobectomy.    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 08/03/2016   Carotid stenosis 02/07/2016   Cataract cortical, senile    Chest pain    Colon polyp    Degeneration of intervertebral disc of lumbar region 03/11/2014   Diabetes (HCC)    Gastric adenocarcinoma (HCC)    GERD (gastroesophageal reflux disease)    also, history of ulcers   History of kidney stones    History of stroke    HLD (hyperlipidemia) 08/24/2013   Hypertension    Malignant tumor of stomach (HCC) 07/2016   Adenocarcinoma, diffuse, poorly differentiated, signet ring, stage I   Neuritis or radiculitis due to rupture of lumbar intervertebral disc 09/10/2014   Neuroendocrine tumor 03/24/2015   Neuroma    Osteoporosis    Peripheral vascular disease (HCC)    PONV (postoperative nausea and vomiting)    Presence of permanent cardiac pacemaker 12/09/2018   Primary malignant neoplasm of right lower lobe of lung (HCC) 12/02/2015   Renal insufficiency    Renal stone    S/P TAVR (transcatheter aortic valve replacement)    Sciatica    Severe aortic stenosis    Skin cancer    cut/burned LUE; cut off right eye/nose & cut off chest (10/15/2017)   Stomach ulcer    Thrombocytopenia (HCC)    Type 2 diabetes, diet controlled (HCC)    no RX since stomach OR 07/2016 (10/15/2017)    PAST SURGICAL HISTORY :   Past Surgical History:  Procedure Laterality Date   APPENDECTOMY     CARDIAC CATHETERIZATION  X2 before 10/15/2017   CARDIAC VALVE REPLACEMENT     CATARACT EXTRACTION W/PHACO Left 04/04/2017   Procedure: CATARACT EXTRACTION PHACO AND INTRAOCULAR LENS PLACEMENT (IOC);  Surgeon: Mittie Gaskin, MD;  Location: [facility] ORS;  Service: Ophthalmology;  Laterality: Left;  Lot # [id number] H US  1:00 Ap 25% CDE 8.54   CATARACT EXTRACTION W/PHACO Right 05/15/2017   Procedure: CATARACT EXTRACTION PHACO AND INTRAOCULAR LENS PLACEMENT (IOC);  Surgeon: Mittie Gaskin, MD;  Location: [facility] ORS;   Service: Ophthalmology;  Laterality: Right;  US  01:10 AP% 18.3 CDE 12.91 Fluid pack lot # [id number]   COLONOSCOPY     CORONARY ATHERECTOMY N/A 10/15/2017   Procedure: CORONARY ATHERECTOMY;  Surgeon: Verlin Lonni BIRCH, MD;  [facility];  Service: Cardiovascular;  Laterality: N/A;   CORONARY STENT INTERVENTION N/A 10/15/2017   Procedure: CORONARY STENT INTERVENTION;  Surgeon: Verlin Lonni BIRCH, MD;  Location: [facility];  Service: Cardiovascular;  Laterality: N/A;   CYSTOSCOPY/URETEROSCOPY/HOLMIUM LASER/STENT PLACEMENT Left 08/03/2019   Procedure: CYSTOSCOPY/URETEROSCOPY/HOLMIUM LASER/STENT PLACEMENT;  Surgeon: Penne Knee, MD;  Location: [facility] ORS;  Service: Urology;  Laterality: Left;   ESOPHAGOGASTRODUODENOSCOPY     ESOPHAGOGASTRODUODENOSCOPY (EGD) WITH PROPOFOL  N/A 03/27/2016   Procedure: ESOPHAGOGASTRODUODENOSCOPY (EGD) WITH PROPOFOL ;  Surgeon: Gladis RAYMOND Mariner, MD;  Location: [facility] ENDOSCOPY;  Service: Endoscopy;  Laterality: N/A;   ESOPHAGOGASTRODUODENOSCOPY (EGD) WITH PROPOFOL  N/A 05/28/2016   Procedure: ESOPHAGOGASTRODUODENOSCOPY (EGD) WITH PROPOFOL ;  Surgeon: Gladis RAYMOND Mariner, MD;  Location: [facility] ENDOSCOPY;  Service: Endoscopy;  Laterality: N/A;   ESOPHAGOGASTRODUODENOSCOPY (EGD) WITH PROPOFOL  N/A 09/25/2016   Procedure: ESOPHAGOGASTRODUODENOSCOPY (EGD) WITH PROPOFOL ;  Surgeon: Dellie Louanne MATSU, MD;  Location: [facility] ENDOSCOPY;  Service: Endoscopy;  Laterality: N/A;   ESOPHAGOGASTRODUODENOSCOPY (EGD) WITH PROPOFOL  N/A 12/18/2016   Procedure: ESOPHAGOGASTRODUODENOSCOPY (EGD) WITH PROPOFOL ;  Surgeon: Dellie Louanne MATSU, MD;  Location: [facility] ENDOSCOPY;  Service: Endoscopy;  Laterality: N/A;   ESOPHAGOGASTRODUODENOSCOPY (EGD) WITH PROPOFOL  N/A 01/23/2017   Procedure: ESOPHAGOGASTRODUODENOSCOPY (EGD) WITH PROPOFOL ;  Surgeon: Dellie Louanne MATSU, MD;  Location: [facility] ENDOSCOPY;  Service: Endoscopy;  Laterality: N/A;   ESOPHAGOGASTRODUODENOSCOPY  (EGD) WITH PROPOFOL  N/A 03/20/2017   Procedure: ESOPHAGOGASTRODUODENOSCOPY (EGD) WITH PROPOFOL ;  Surgeon: Dellie Louanne MATSU, MD;  Location: [facility] ENDOSCOPY;  Service: Endoscopy;  Laterality: N/A;   ESOPHAGOGASTRODUODENOSCOPY (EGD) WITH PROPOFOL  N/A 09/23/2020   Procedure: ESOPHAGOGASTRODUODENOSCOPY (EGD) WITH PROPOFOL ;  Surgeon: Maryruth Ole DASEN, MD;  Location: [facility] ENDOSCOPY;  Service: Endoscopy;  Laterality: N/A;  [facility] SAYS AMPICILLIN IS NEEDED   FRACTURE SURGERY     INSERT / REPLACE / REMOVE PACEMAKER  12/09/2018   PACEMAKER LEADLESS INSERTION N/A 12/09/2018   Procedure: PACEMAKER LEADLESS INSERTION;  Surgeon: Kelsie Agent, MD;  [facility];  Service: Cardiovascular;  Laterality: N/A;   PARTIAL GASTRECTOMY N/A 08/03/2016   Hemigastrectomy, Billroth I reconstruction Surgeon: Louanne MATSU Dellie, MD;  Location: [facility] ORS;  Service: General;  Laterality: N/A;   RIGHT/LEFT HEART CATH AND CORONARY ANGIOGRAPHY Bilateral 09/19/2017   Procedure: RIGHT/LEFT HEART CATH AND CORONARY ANGIOGRAPHY;  Surgeon: Florencio Cara BIRCH, MD;  Location: [facility] INV[facility]IVE CV LAB;  Service: Cardiovascular;  Laterality: Bilateral;   SHOULDER ARTHROSCOPY W/ CAPSULAR REPAIR Right    SKIN CANCER EXCISION     cut/burned LUE; cut off right eye/nose & cut off chest (10/15/2017)   TEE WITHOUT CARDIOVERSION N/A 12/02/2018   Procedure: TRANSESOPHAGEAL ECHOCARDIOGRAM (TEE);  Surgeon: Verlin Lonni BIRCH, MD;  Location: [facility] INVASIVE CV LAB;  Service: Open Heart Surgery;  Laterality: N/A;   THORACOTOMY Right 05/09/2015   Procedure: THORACOTOMY, RIGHT LOWER LOBECTOMY, BRONCHOSCOPY;  Surgeon: Evalene Glasser, MD;  Location: [facility] ORS;  Service: Thoracic;  Laterality: Right;   TONSILLECTOMY  1944   TRANSCATHETER AORTIC VALVE REPLACEMENT, TRANSFEMORAL N/A 12/02/2018   Procedure: TRANSCATHETER AORTIC VALVE REPLACEMENT, TRANSFEMORAL;  Surgeon: Verlin Lonni BIRCH, MD;  [facility];  Service: Open  Heart Surgery;  Laterality: N/A;   TUBAL LIGATION     UPPER GI ENDOSCOPY N/A 08/03/2016   Procedure: UPPER  ENDOSCOPY;  Surgeon: Louanne MATSU Dellie, MD;  Location: [facility] ORS;  Service: General;  Laterality: N/A;   VAGINAL HYSTERECTOMY     WRIST FRACTURE SURGERY Right     FAMILY HISTORY :   Family History  Problem Relation Age of Onset   Aortic aneurysm Mother    Kidney Stones Mother    Hypertension Mother    Hypertension Father    Diabetes Other    Breast cancer Neg Hx    Prostate cancer Neg Hx    Bladder Cancer Neg Hx    Kidney cancer Neg Hx     SOCIAL HISTORY:   Social History   Tobacco Use   Smoking status: Never   Smokeless tobacco: Never  Vaping Use   Vaping status: Never Used  Substance Use Topics   Alcohol use: Not Currently   Drug use: Never    ALLERGIES:  is allergic to mirtazapine, pneumococcal vaccine, statins, and sulfa antibiotics.  MEDICATIONS:  Current Outpatient Medications  Medication Sig Dispense Refill   apixaban  (ELIQUIS ) 2.5 MG TABS tablet Take 1 tablet (2.5 mg total) by mouth 2 (two) times  daily. 180 tablet 1   cholecalciferol  (VITAMIN D ) 1000 UNITS tablet Take 1,000 Units by mouth daily.     cyanocobalamin  1000 MCG tablet Take 1,000 mcg by mouth daily.      eltrombopag  (PROMACTA ) 25 MG tablet Take 1 tablet (25 mg total) by mouth daily. Take on an empty stomach, 1 hour before a meal or 2 hours after. 30 tablet 2   ezetimibe  (ZETIA ) 10 MG tablet Take 1 tablet (10 mg total) by mouth daily. 90 tablet 3   ferrous sulfate  325 (65 FE) MG tablet Take 325 mg by mouth daily with breakfast.     isosorbide  mononitrate (IMDUR ) 30 MG 24 hr tablet Take 1 tablet (30 mg total) by mouth daily. 90 tablet 3   Magnesium  100 MG TABS Take 1 tablet by mouth daily at 6 (six) AM.     metoprolol  succinate (TOPROL -XL) 50 MG 24 hr tablet Take 1 tablet (50 mg total) by mouth every evening. Take with or immediately following a meal. 30 tablet 0   osimertinib  mesylate  (TAGRISSO ) 80 MG tablet Take 1 tablet (80 mg total) by mouth daily. 30 tablet 1   potassium citrate  (UROCIT-K ) 10 MEQ (1080 MG) SR tablet Take 1 tablet by mouth once daily 90 tablet 0   No current facility-administered medications for this visit.    PHYSICAL EXAMINATION: ECOG PERFORMANCE STATUS: 0 - Asymptomatic  BP (!) 146/59 (BP Location: Left Arm, Patient Position: Sitting, Cuff Size: Small) Comment: pt told bp elevated, keep check at home, contact pcp if con't to stay elevated  Pulse 85   Temp (!) 97 F (36.1 C) (Tympanic)   Resp 20   Ht 5' 1 (1.549 m)   Wt 99 lb 14.4 oz (45.3 kg)   SpO2 100%   BMI 18.88 kg/m   Filed Weights   10/18/23 1325  Weight: 99 lb 14.4 oz (45.3 kg)       Decreased breath sounds on the right side compared to left.  Physical Exam Constitutional:      Comments: Patient walking by herself; no assistive devices.  Alone.  HENT:     Head: Normocephalic and atraumatic.     Mouth/Throat:     Pharynx: No oropharyngeal exudate.  Eyes:     Pupils: Pupils are equal, round, and reactive to light.  Cardiovascular:     Rate and Rhythm: Normal rate and regular rhythm.     Heart sounds: Murmur heard.  Pulmonary:     Effort: No respiratory distress.     Breath sounds: No wheezing.  Abdominal:     General: Bowel sounds are normal. There is no distension.     Palpations: Abdomen is soft. There is no mass.     Tenderness: There is no abdominal tenderness. There is no guarding or rebound.  Musculoskeletal:        General: No tenderness. Normal range of motion.     Cervical back: Normal range of motion and neck supple.  Skin:    General: Skin is warm.  Neurological:     Mental Status: She is alert and oriented to person, place, and time.     Comments: Weakness of the right upper extremity more than lower extremity.  Psychiatric:        Mood and Affect: Affect normal.    LABORATORY DATA:  I have reviewed the data as listed    Component Value  Date/Time   NA 134 (L) 10/18/2023 1327   NA 141 02/05/2022 1405  NA 141 07/21/2012 1529   K 4.2 10/18/2023 1327   K 3.9 07/21/2012 1529   CL 102 10/18/2023 1327   CL 109 (H) 07/21/2012 1529   CO2 24 10/18/2023 1327   CO2 27 07/21/2012 1529   GLUCOSE 112 (H) 10/18/2023 1327   GLUCOSE 104 (H) 07/21/2012 1529   BUN 30 (H) 10/18/2023 1327   BUN 34 (H) 02/05/2022 1405   BUN 16 07/21/2012 1529   CREATININE 1.58 (H) 10/18/2023 1327   CREATININE 1.11 09/03/2012 1535   CALCIUM  8.8 (L) 10/18/2023 1327   CALCIUM  8.9 07/21/2012 1529   PROT 6.1 (L) 10/18/2023 1327   PROT 7.1 07/21/2012 1529   ALBUMIN 3.6 10/18/2023 1327   ALBUMIN 3.9 07/21/2012 1529   AST 18 10/18/2023 1327   ALT 9 10/18/2023 1327   ALT 22 07/21/2012 1529   ALKPHOS 82 10/18/2023 1327   ALKPHOS 76 07/21/2012 1529   BILITOT 0.6 10/18/2023 1327   GFRNONAA 32 (L) 10/18/2023 1327   GFRNONAA 49 (L) 09/03/2012 1535   GFRAA 29 (L) 01/05/2020 1440   GFRAA 57 (L) 09/03/2012 1535    No results found for: SPEP, UPEP  Lab Results  Component Value Date   WBC 5.2 10/18/2023   NEUTROABS 3.6 10/18/2023   HGB 10.9 (L) 10/18/2023   HCT 32.4 (L) 10/18/2023   MCV 91.5 10/18/2023   PLT 139 (L) 10/18/2023      Chemistry      Component Value Date/Time   NA 134 (L) 10/18/2023 1327   NA 141 02/05/2022 1405   NA 141 07/21/2012 1529   K 4.2 10/18/2023 1327   K 3.9 07/21/2012 1529   CL 102 10/18/2023 1327   CL 109 (H) 07/21/2012 1529   CO2 24 10/18/2023 1327   CO2 27 07/21/2012 1529   BUN 30 (H) 10/18/2023 1327   BUN 34 (H) 02/05/2022 1405   BUN 16 07/21/2012 1529   CREATININE 1.58 (H) 10/18/2023 1327   CREATININE 1.11 09/03/2012 1535      Component Value Date/Time   CALCIUM  8.8 (L) 10/18/2023 1327   CALCIUM  8.9 07/21/2012 1529   ALKPHOS 82 10/18/2023 1327   ALKPHOS 76 07/21/2012 1529   AST 18 10/18/2023 1327   ALT 9 10/18/2023 1327   ALT 22 07/21/2012 1529   BILITOT 0.6 10/18/2023 1327       RADIOGRAPHIC  STUDIES: I have personally reviewed the radiological images as listed and agreed with the findings in the report. No results found.   ASSESSMENT & PLAN:  Primary malignant neoplasm of right lower lobe of lung (HCC) # Stage IV recurrent adenocarcinoma the lung; EGFR mutated. On osimertinib  80 mg [Feb 6th 2020] APRIL 17th, 2025-  Pleural thickening on the right with generally moderate accentuated pleural activity, with some focal regions of accentuated pleural activity including anteromedially about the vertical level of the top of the TAVR. Pleural metastatic disease not excluded. A focus of accentuated metabolic activity anterior to the right middle lobe bronchus has a maximum SUV of 4.9 and is suspicious for a small hypermetabolic lymph node.The patchy ground-glass and interstitial opacities in the right upper lobe and right middle lobe have substantially improved compared to 07/08/2023, although not completely resolved. This appearance favors transient unilateral edema or inflammation in the right lung over lymphangitic spread of tumor.  # currently on Osimertinib  80 mg/day-  Discussed with Dr. Aleskerov-for consideration of biopsy/ draining the effusion. For no will continue Osimertinib . Will repeat PET scan in 23 months-ordered today.   #  Right pleural effusion-status postthoracentesis-pleural fluid: Transudative; cytology negative for malignancy.  Question CHF/fluid overload.  S/p Lasix  20 mg a day- improved/stable. Currently not on any lasix  [ARF]- stable.   # Chronic anemia- Hb 10-18 Dec 2021- Iron  sat- 22%; ferritin- 137- continue Iron  pills once day. Hold off Venofer - STABLE.   # ITP- chronic- platelets> 100 on promacta  25 mg/day- STABLE.   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 STABLE.   # A-fib [feb 2024] on Eliquis  2.5/cardiology. STABLE.   # right sided stroke/ seizures-on asprin; off plavix ;; f/u neurology-[place]-  STABLE.   # CKD- stage IV; Left  Hydronephrosis s/p ureteral stent placement/nephrolithiasis-~ GFR 34 today; Hypokalemia-3.4  continue K-citrate q day.STABLE.   # IV ACCESS: PIV  # DISPOSITION: # no venofer  today # follow up in 3 months- MD; labs- cbc/cmp; iron  studies; ferritin; PET scan- Dr.B          Orders Placed This Encounter  Procedures   NM PET Image Restage (PS) Skull Base to Thigh (F-18 FDG)    Standing Status:   Future    Expected Date:   01/18/2024    Expiration Date:   10/17/2024    If indicated for the ordered procedure, I authorize the administration of a radiopharmaceutical per Radiology protocol:   Yes    Preferred imaging location?:   [facility]   CBC with Differential (Cancer Center Only)    Standing Status:   Future    Expected Date:   01/17/2024    Expiration Date:   10/17/2024   CMP (Cancer Center only)    Standing Status:   Future    Expected Date:   01/17/2024    Expiration Date:   10/17/2024   Iron  and TIBC    Standing Status:   Future    Expected Date:   01/17/2024    Expiration Date:   10/17/2024   Ferritin    Standing Status:   Future    Expected Date:   01/17/2024    Expiration Date:   10/17/2024   All questions were answered. The patient knows to call the clinic with any problems, questions or concerns.      Cindy JONELLE Joe, MD 10/18/2023 2:36 PM

## 2023-10-18 NOTE — Assessment & Plan Note (Addendum)
#   Stage IV recurrent adenocarcinoma the lung; EGFR mutated. On osimertinib  80 mg [Feb 6th 2020] APRIL 17th, 2025-  Pleural thickening on the right with generally moderate accentuated pleural activity, with some focal regions of accentuated pleural activity including anteromedially about the vertical level of the top of the TAVR. Pleural metastatic disease not excluded. A focus of accentuated metabolic activity anterior to the right middle lobe bronchus has a maximum SUV of 4.9 and is suspicious for a small hypermetabolic lymph node.The patchy ground-glass and interstitial opacities in the right upper lobe and right middle lobe have substantially improved compared to 07/08/2023, although not completely resolved. This appearance favors transient unilateral edema or inflammation in the right lung over lymphangitic spread of tumor.  # currently on Osimertinib  80 mg/day-  Discussed with Dr. Aleskerov-for consideration of biopsy/ draining the effusion. For no will continue Osimertinib . Will repeat PET scan in 23 months-ordered today.   #Right pleural effusion-status postthoracentesis-pleural fluid: Transudative; cytology negative for malignancy.  Question CHF/fluid overload.  S/p Lasix  20 mg a day- improved/stable. Currently not on any lasix  [ARF]- stable.   # Chronic anemia- Hb 10-18 Dec 2021- Iron  sat- 22%; ferritin- 137- continue Iron  pills once day. Hold off Venofer - STABLE.   # ITP- chronic- platelets> 100 on promacta  25 mg/day- STABLE.   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 STABLE.   # A-fib [feb 2024] on Eliquis  2.5/cardiology. STABLE.   # right sided stroke/ seizures-on asprin; off plavix ;; f/u neurology-[place]-  STABLE.   # CKD- stage IV; Left Hydronephrosis s/p ureteral stent placement/nephrolithiasis-~ GFR 34 today; Hypokalemia-3.4  continue K-citrate q day.STABLE.   # IV ACCESS: PIV  # DISPOSITION: # no venofer  today # follow up in 3 months- MD; labs-  cbc/cmp; iron  studies; ferritin; PET scan- Dr.B

## 2023-10-18 NOTE — Progress Notes (Signed)
 Appetite 25% normal, 1 ensure per day.  C/o no energy, DOE x2-3 months. Fell 3 weeks ago coming thru her back door, no injuries.  C/o cough and choking on different things. (Pills/mucus)

## 2023-10-31 ENCOUNTER — Other Ambulatory Visit (HOSPITAL_COMMUNITY): Payer: Self-pay

## 2023-10-31 ENCOUNTER — Other Ambulatory Visit: Payer: Self-pay

## 2023-10-31 ENCOUNTER — Telehealth: Payer: Self-pay

## 2023-10-31 NOTE — Progress Notes (Unsigned)
 Benefits Investigation Started  Rejection: Prior Authorization Required  Routed to: [employer]

## 2023-10-31 NOTE — Telephone Encounter (Signed)
 Oral Oncology Patient Advocate Encounter   Received notification that prior authorization for Eltrombopag  is required.   PA submitted on 10/31/23 Key [id number] Status is pending      Charlott Hamilton,  CPhT-Adv  she/her/hers [facility]  [facility] Specialty Pharmacy Services Pharmacy Technician Patient Advocate Specialist III [facility] Phone: [phone]  Fax: [phone] [email or web address]

## 2023-11-01 ENCOUNTER — Other Ambulatory Visit: Payer: Self-pay

## 2023-11-01 ENCOUNTER — Encounter: Payer: Self-pay | Admitting: Internal Medicine

## 2023-11-01 NOTE — Progress Notes (Signed)
 Specialty Pharmacy Refill Coordination Note  Stacy Cook is a 88 y.o. female contacted today regarding refills of specialty medication(s) Eltrombopag  Olamine (PROMACTA ); Osimertinib  Mesylate (TAGRISSO )   Patient requested Delivery   Delivery date: 11/05/23   Verified address: [name] [place] [address]   Medication will be filled on 11/04/23.

## 2023-11-01 NOTE — Telephone Encounter (Signed)
 Oral Oncology Patient Advocate Encounter  Prior Authorization for Eltrombopag  has been approved.    PA# [id number] Effective dates: 04/10/2023 through 04/08/2024  Patient can continue to fill at [facility].   Maliek Schellhorn (Patty) Chet Burnet, CPhT  [facility], [place], [place], [place] Oral Chemotherapy Patient Advocate Phone: [phone]  Fax: [phone]

## 2023-11-04 ENCOUNTER — Other Ambulatory Visit: Payer: Self-pay

## 2023-11-10 NOTE — Progress Notes (Unsigned)
 Cardiology Office Note Date:  11/10/2023  Patient ID:  Stacy Cook, DOB 1935/06/26, MRN [id number] PCP:  [name], MD  Cardiologist:  Dr. Hobart (inactive) Electrophysiologist: Dr. Kelsie >>> Dr. Cindie    Chief Complaint:  *** annual device visit  History of Present Illness: Stacy Cook is a 88 y.o. female with history of VHD/AS s/p TAVR (12/02/18) > CHB w/PPM,  CAD (PCI to RCA/LCx 2019),  lung Cancer, (RLL lobectomy 2017, recurrent malignant pleural effusions),   CKD III, DM, HTN, HLD, stroke,  HFpEF AFib  Pre TAVR CTs revealed a new pleural effusion suspicious fo recurrent malignancy. Cytology confirmed the presence of malignant cells and recurrence of her lung cancer. She was referred to Dr. Amey with oncology and her TAVR was delayed.  treated with oral chemotherapy and has had two thoracenteses   Ongoing SOB felt to have component from her severe AS  TAVR 12/02/2018, complicated by stroke > discharged to rehab > CHB > back to the hospital > PPM  She has followed with gen cards team as well as EP  Most recently:  Saw Dr. Cindie 09/28/22, stable pacer function, low AF burden, no changes made  She saw T. 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 Volney performed Right lower lobe lobectomy.    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 08/03/2016   Carotid stenosis 02/07/2016   Cataract cortical, senile    Chest pain    Colon polyp    Degeneration of intervertebral disc of lumbar region 03/11/2014   Diabetes (HCC)    Gastric adenocarcinoma (HCC)    GERD (gastroesophageal reflux disease)    also, history of ulcers   History of kidney stones    History of stroke    HLD (hyperlipidemia) 08/24/2013   Hypertension    Malignant tumor of stomach (HCC) 07/2016   Adenocarcinoma, diffuse, poorly differentiated, signet ring, stage I   Neuritis or radiculitis due to rupture of lumbar intervertebral disc 09/10/2014   Neuroendocrine tumor 03/24/2015   Neuroma    Osteoporosis    Peripheral vascular disease (HCC)    PONV (postoperative nausea and vomiting)    Presence of permanent cardiac pacemaker 12/09/2018   Primary malignant neoplasm of right lower lobe of lung (HCC) 12/02/2015   Renal insufficiency    Renal stone    S/P TAVR (transcatheter aortic valve replacement)    Sciatica    Severe aortic stenosis    Skin cancer    cut/burned LUE; cut off right eye/nose & cut off chest (10/15/2017)   Stomach ulcer    Thrombocytopenia (HCC)    Type  2 diabetes, diet controlled (HCC)    no RX since stomach OR 07/2016 (10/15/2017)    Past Surgical History:  Procedure Laterality Date   APPENDECTOMY     CARDIAC CATHETERIZATION  X2 before 10/15/2017   CARDIAC VALVE REPLACEMENT     CATARACT EXTRACTION W/PHACO Left 04/04/2017   Procedure: CATARACT EXTRACTION PHACO AND INTRAOCULAR LENS PLACEMENT (IOC);  Surgeon: Mittie Gaskin, MD;  Location: [facility];  Service: Ophthalmology;  Laterality: Left;  Lot # [id number] H US  1:00 Ap 25% CDE 8.54   CATARACT EXTRACTION W/PHACO Right 05/15/2017   Procedure: CATARACT EXTRACTION PHACO AND INTRAOCULAR LENS PLACEMENT (IOC);  Surgeon: Mittie Gaskin, MD;   Location: [facility];  Service: Ophthalmology;  Laterality: Right;  US  01:10 AP% 18.3 CDE 12.91 Fluid pack lot # [id number]   COLONOSCOPY     CORONARY ATHERECTOMY N/A 10/15/2017   Procedure: CORONARY ATHERECTOMY;  Surgeon: Verlin Lonni BIRCH, MD;  [facility]: [facility];  Service: Cardiovascular;  Laterality: N/A;   CORONARY STENT INTERVENTION N/A 10/15/2017   Procedure: CORONARY STENT INTERVENTION;  Surgeon: Verlin Lonni BIRCH, MD;  Location: [facility];  Service: Cardiovascular;  Laterality: N/A;   CYSTOSCOPY/URETEROSCOPY/HOLMIUM LASER/STENT PLACEMENT Left 08/03/2019   Procedure: CYSTOSCOPY/URETEROSCOPY/HOLMIUM LASER/STENT PLACEMENT;  Surgeon: Penne Knee, MD;  Location: [facility];  Service: Urology;  Laterality: Left;   ESOPHAGOGASTRODUODENOSCOPY     ESOPHAGOGASTRODUODENOSCOPY (EGD) WITH PROPOFOL  N/A 03/27/2016   Procedure: ESOPHAGOGASTRODUODENOSCOPY (EGD) WITH PROPOFOL ;  Surgeon: Gladis RAYMOND Mariner, MD;  Location: [facility] ENDOSCOPY;  Service: Endoscopy;  Laterality: N/A;   ESOPHAGOGASTRODUODENOSCOPY (EGD) WITH PROPOFOL  N/A 05/28/2016   Procedure: ESOPHAGOGASTRODUODENOSCOPY (EGD) WITH PROPOFOL ;  Surgeon: Gladis RAYMOND Mariner, MD;  Location: [facility] ENDOSCOPY;  Service: Endoscopy;  Laterality: N/A;   ESOPHAGOGASTRODUODENOSCOPY (EGD) WITH PROPOFOL  N/A 09/25/2016   Procedure: ESOPHAGOGASTRODUODENOSCOPY (EGD) WITH PROPOFOL ;  Surgeon: Dellie Louanne MATSU, MD;  Location: [facility] ENDOSCOPY;  Service: Endoscopy;  Laterality: N/A;   ESOPHAGOGASTRODUODENOSCOPY (EGD) WITH PROPOFOL  N/A 12/18/2016   Procedure: ESOPHAGOGASTRODUODENOSCOPY (EGD) WITH PROPOFOL ;  Surgeon: Dellie Louanne MATSU, MD;  Location: [facility] ENDOSCOPY;  Service: Endoscopy;  Laterality: N/A;   ESOPHAGOGASTRODUODENOSCOPY (EGD) WITH PROPOFOL  N/A 01/23/2017   Procedure: ESOPHAGOGASTRODUODENOSCOPY (EGD) WITH PROPOFOL ;  Surgeon: Dellie Louanne MATSU, MD;  Location: [facility] ENDOSCOPY;  Service: Endoscopy;  Laterality: N/A;    ESOPHAGOGASTRODUODENOSCOPY (EGD) WITH PROPOFOL  N/A 03/20/2017   Procedure: ESOPHAGOGASTRODUODENOSCOPY (EGD) WITH PROPOFOL ;  Surgeon: Dellie Louanne MATSU, MD;  Location: [facility] ENDOSCOPY;  Service: Endoscopy;  Laterality: N/A;   ESOPHAGOGASTRODUODENOSCOPY (EGD) WITH PROPOFOL  N/A 09/23/2020   Procedure: ESOPHAGOGASTRODUODENOSCOPY (EGD) WITH PROPOFOL ;  Surgeon: Maryruth Ole DASEN, MD;  Location: [facility] ENDOSCOPY;  Service: Endoscopy;  Laterality: N/A;  KC SAYS AMPICILLIN IS NEEDED   FRACTURE SURGERY     INSERT / REPLACE / REMOVE PACEMAKER  12/09/2018   PACEMAKER LEADLESS INSERTION N/A 12/09/2018   Procedure: PACEMAKER LEADLESS INSERTION;  Surgeon: Kelsie Agent, MD;  [facility];  Service: Cardiovascular;  Laterality: N/A;   PARTIAL GASTRECTOMY N/A 08/03/2016   Hemigastrectomy, Billroth I reconstruction Surgeon: Louanne MATSU Dellie, MD;  Location: [facility];  Service: General;  Laterality: N/A;   RIGHT/LEFT HEART CATH AND CORONARY ANGIOGRAPHY Bilateral 09/19/2017   Procedure: RIGHT/LEFT HEART CATH AND CORONARY ANGIOGRAPHY;  Surgeon: Florencio Cara BIRCH, MD;  Location: [facility];  Service: Cardiovascular;  Laterality: Bilateral;   SHOULDER ARTHROSCOPY W/ CAPSULAR REPAIR Right    SKIN CANCER EXCISION     cut/burned LUE; cut off right eye/nose & cut off chest (10/15/2017)   TEE WITHOUT CARDIOVERSION  N/A 12/02/2018   Procedure: TRANSESOPHAGEAL ECHOCARDIOGRAM (TEE);  Surgeon: Verlin Lonni BIRCH, MD;  Location: [facility] INVASIVE CV LAB;  Service: Open Heart Surgery;  Laterality: N/A;   THORACOTOMY Right 05/09/2015   Procedure: THORACOTOMY, RIGHT LOWER LOBECTOMY, BRONCHOSCOPY;  Surgeon: Evalene Glasser, MD;  Location: [facility] ORS;  Service: Thoracic;  Laterality: Right;   TONSILLECTOMY  1944   TRANSCATHETER AORTIC VALVE REPLACEMENT, TRANSFEMORAL N/A 12/02/2018   Procedure: TRANSCATHETER AORTIC VALVE REPLACEMENT, TRANSFEMORAL;  Surgeon: Verlin Lonni BIRCH, MD;  [facility]: [facility];  Service: Open Heart Surgery;  Laterality: N/A;   TUBAL LIGATION     UPPER GI ENDOSCOPY N/A 08/03/2016   Procedure: UPPER  ENDOSCOPY;  Surgeon: Louanne KANDICE Muse, MD;  Location: [facility] OR[facility];  Service: General;  Laterality: N/A;   VAGINAL HYSTERECTOMY     WRIST FRACTURE SURGERY Right     Current Outpatient Medications  Medication Sig Dispense Refill   apixaban  (ELIQUIS ) 2.5 MG TABS tablet Take 1 tablet (2.5 mg total) by mouth 2 (two) times daily. 180 tablet 1   cholecalciferol  (VITAMIN D ) 1000 UNITS tablet Take 1,000 Units by mouth daily.     cyanocobalamin  1000 MCG tablet Take 1,000 mcg by mouth daily.      eltrombopag  (PROMACTA ) 25 MG tablet Take 1 tablet (25 mg total) by mouth daily. Take on an empty stomach, 1 hour before a meal or 2 hours after. 30 tablet 2   ezetimibe  (ZETIA ) 10 MG tablet Take 1 tablet (10 mg total) by mouth daily. 90 tablet 3   ferrous sulfate  325 (65 FE) MG tablet Take 325 mg by mouth daily with breakfast.     isosorbide  mononitrate (IMDUR ) 30 MG 24 hr tablet Take 1 tablet (30 mg total) by mouth daily. 90 tablet 3   Magnesium  100 MG TABS Take 1 tablet by mouth daily at 6 (six) AM.     metoprolol  succinate (TOPROL -XL) 50 MG 24 hr tablet Take 1 tablet (50 mg total) by mouth every evening. Take with or immediately following a meal. 30 tablet 0   osimertinib  mesylate (TAGRISSO ) 80 MG tablet Take 1 tablet (80 mg total) by mouth daily. 30 tablet 1   potassium citrate  (UROCIT-K ) 10 MEQ (1080 MG) SR tablet Take 1 tablet by mouth once daily 90 tablet 0   No current facility-administered medications for this visit.    Allergies:   Mirtazapine, Pneumococcal vaccine, Statins, and Sulfa antibiotics   Social History:  The patient  reports that she has never smoked. She has never used smokeless tobacco. She reports that she does not currently use alcohol. She reports that she does not use drugs.   Family History:  The patient's family history includes Aortic  aneurysm in her mother; Diabetes in an other family member; Hypertension in her father and mother; Kidney Stones in her mother.  ROS:  Please see the history of present illness.    All other systems are reviewed and otherwise negative.   PHYSICAL EXAM:  VS:  There were no vitals taken for this visit. BMI: There is no height or weight on file to calculate BMI. thin, well developed, in no acute distress, chronically ill appearing HEENT: normocephalic, atraumatic Neck: no JVD, carotid bruits or masses Cardiac: *** RRR; no significant murmurs, no rubs, or gallops Lungs: *** decreased R base, otherwise CTA b/l, no wheezing, rhonchi or rales Abd: soft, nontender MS: no deformity, advanced  atrophy Ext: *** no edema Skin: warm and dry, no rash Neuro:  No gross  deficits appreciated Psych: euthymic mood, full affect   EKG:  not done today   Device interrogation done today and reviewed by myself:  *** Battery and electrode measurements are good AM/VP *** %  02/04/23 Echo IMPRESSIONS   1. Left ventricular ejection fraction, by estimation, is 60 to 65%. The  left ventricle has normal function. The left ventricle has no regional  wall motion abnormalities. Left ventricular diastolic parameters are  consistent with Grade II diastolic  dysfunction (pseudonormalization). Elevated left atrial pressure.   2. Right ventricular systolic function is normal. The right ventricular  size is normal. Tricuspid regurgitation signal is inadequate for assessing  PA pressure.   3. Left atrial size was moderately dilated.   4. The mitral valve is degenerative. Mild mitral valve regurgitation. No  evidence of mitral stenosis. Moderate to severe mitral annular  calcification.   5. The aortic valve has been repaired/replaced. Aortic valve  regurgitation is not visualized. There is a 23 mm CoreValve-Evolut Pro  prosthetic (TAVR) valve present in the aortic position. Procedure Date:  2020. Echo findings are  consistent with normal  structure and function of the aortic valve prosthesis. Aortic valve mean  gradient measures 9.4 mmHg. Aortic valve Vmax measures 2.02 m/s. Aortic  valve acceleration time measures 74 msec.   6. The inferior vena cava is normal in size with greater than 50%  respiratory variability, suggesting right atrial pressure of 3 mmHg.     02/05/22: TTE 1. Left ventricular ejection fraction, by estimation, is 60 to 65%. The  left ventricle has normal function. The left ventricle has no regional  wall motion abnormalities. There is mild left ventricular hypertrophy.  Left ventricular diastolic parameters  are indeterminate.   2. Right ventricular systolic function is normal. The right ventricular  size is normal. There is mildly elevated pulmonary artery systolic  pressure. The estimated right ventricular systolic pressure is 44.5 mmHg.   3. Left atrial size was moderately dilated.   4. The mitral valve is normal in structure. Mild mitral valve  regurgitation. No evidence of mitral stenosis. Severe mitral annular  calcification.   5. The aortic valve has been repaired/replaced. Aortic valve  regurgitation is not visualized. No aortic stenosis is present. There is a  23 mm CoreValve-Evolut Pro prosthetic (TAVR) valve present in the aortic  position. Procedure Date: 12/02/2018. Echo  findings are consistent with normal structure and function of the aortic  valve prosthesis. Aortic valve mean gradient measures 9.4 mmHg. Aortic  valve Vmax measures 2.16 m/s.   6. The inferior vena cava is normal in size with greater than 50%  respiratory variability, suggesting right atrial pressure of 3 mmHg.   Comparison(s): No significant change from prior study.    10/15/2017: LHC/PCI Prox RCA to Mid RCA lesion is 50% stenosed. Dist RCA lesion is 90% stenosed. A drug-eluting stent was successfully placed using a STENT SYNERGY DES 3X16. Post intervention, there is a 0% residual  stenosis. Prox Cx to Mid Cx lesion is 95% stenosed. Prox LAD lesion is 70% stenosed. A drug-eluting stent was successfully placed using a STENT SYNERGY DES 3X20. Post intervention, there is a 0% residual stenosis.   1. Severe stenosis mid Circumflex. Successful PTCA/orbital atherectomy/DES placement 2. Severe stenosis distal RCA. Successful PTCA/DES x 1 distal RCA   Recommendations: Will continue DAPT with ASA and Plavix  for one year. Her aortic stenosis is felt to be moderately severe by echo last week. Peak to peak gradient today by cath 17 mmHg.  I reviewed her echo images with the TAVR team. We will hold off on TAVR right now. She will likely need TAVR within the next 6-18 months.    Recommend uninterrupted dual antiplatelet therapy with Aspirin  81mg  daily for a minimum of 12 months (ACS - Class I recommendation). (Plavix  in addition to ASA).     Recent Labs: 10/18/2023: ALT 9; BUN 30; Creatinine 1.58; Hemoglobin 10.9; Platelet Count 139; Potassium 4.2; Sodium 134  No results found for requested labs within last 365 days.   CrCl cannot be calculated (Patient's most recent lab result is older than the maximum 21 days allowed.).   Wt Readings from Last 3 Encounters:  10/18/23 99 lb 14.4 oz (45.3 kg)  07/22/23 100 lb 6.4 oz (45.5 kg)  06/06/23 103 lb 9.6 oz (47 kg)     Other studies reviewed: Additional studies/records reviewed today include: summarized above  ASSESSMENT AND PLAN:  PPM Done with industry support She is dependent ***   AFib CHA2DS2Vasc is 8, on Eliquis , *** appropriately dosed *** burden by symptoms  VHD S/p TAVR Functioning well by her last echo 2024 C/w gen cards team   HFpEF Chronic CHF *** No symptoms or exam finding of volume OL  C/w gen cards team    Disposition: F/u with us  in *** , sooner if needed.  Current medicines are reviewed at length with the patient today.  The patient did not have any concerns regarding  medicines.  Bonney Charlies Arthur, PA-C 11/10/2023 9:05 AM     [facility] [address] [place] [address] [phone] (office)  [phone] (fax)

## 2023-11-12 ENCOUNTER — Ambulatory Visit: Attending: Physician Assistant | Admitting: Physician Assistant

## 2023-11-12 VITALS — BP 102/64 | HR 96 | Ht 61.0 in | Wt 99.0 lb

## 2023-11-12 DIAGNOSIS — I5032 Chronic diastolic (congestive) heart failure: Secondary | ICD-10-CM | POA: Diagnosis not present

## 2023-11-12 DIAGNOSIS — D6869 Other thrombophilia: Secondary | ICD-10-CM | POA: Diagnosis not present

## 2023-11-12 DIAGNOSIS — I480 Paroxysmal atrial fibrillation: Secondary | ICD-10-CM

## 2023-11-12 DIAGNOSIS — I442 Atrioventricular block, complete: Secondary | ICD-10-CM

## 2023-11-12 DIAGNOSIS — Z952 Presence of prosthetic heart valve: Secondary | ICD-10-CM | POA: Diagnosis not present

## 2023-11-12 DIAGNOSIS — Z950 Presence of cardiac pacemaker: Secondary | ICD-10-CM

## 2023-11-12 LAB — CUP PACEART INCLINIC DEVICE CHECK
Date Time Interrogation Session: 20250805111057
Implantable Pulse Generator Implant Date: 20200901

## 2023-11-12 NOTE — Patient Instructions (Addendum)
 Medication Instructions:   Your physician recommends that you continue on your current medications as directed. Please refer to the Current Medication list given to you today.  *If you need a refill on your cardiac medications before your next appointment, please call your pharmacy*   Lab Work: NONE ORDERED  TODAY   If you have labs (blood work) drawn today and your tests are completely normal, you will receive your results only by: MyChart Message (if you have MyChart) OR A paper copy in the mail If you have any lab test that is abnormal or we need to change your treatment, we will call you to review the results.   Testing/Procedures: NONE ORDERED  TODAY    Follow-Up: At [facility], you and your health needs are our priority.  As part of our continuing mission to provide you with exceptional heart care, our providers are all part of one team.  This team includes your primary Cardiologist (physician) and Advanced Practice Providers or APPs (Physician Assistants and Nurse Practitioners) who all work together to provide you with the care you need, when you need it.  Your next appointment:   Provider:  IN 2 MONTHS You may see one of the following Advanced Practice Providers on your designated Care Team:   Charlies Arthur, [place]   Ozell Jodie Passey, [place]   Daphne Barrack, NP ( CONTACT  CASSIE HALL/ ANGELINE HAMMER FOR EP SCHEDULING ISSUES )   We recommend signing up for the patient portal called MyChart.  Sign up information is provided on this After Visit Summary.  MyChart is used to connect with patients for Virtual Visits (Telemedicine).  Patients are able to view lab/test results, encounter notes, upcoming appointments, etc.  Non-urgent messages can be sent to your provider as well.   To learn more about what you can do with MyChart, go to [email or web address].   Other Instructions

## 2023-11-13 ENCOUNTER — Ambulatory Visit: Payer: Self-pay | Admitting: Cardiology

## 2023-11-21 DIAGNOSIS — E785 Hyperlipidemia, unspecified: Secondary | ICD-10-CM | POA: Diagnosis not present

## 2023-11-21 DIAGNOSIS — N1832 Chronic kidney disease, stage 3b: Secondary | ICD-10-CM | POA: Diagnosis not present

## 2023-11-21 DIAGNOSIS — E1121 Type 2 diabetes mellitus with diabetic nephropathy: Secondary | ICD-10-CM | POA: Diagnosis not present

## 2023-11-21 DIAGNOSIS — E538 Deficiency of other specified B group vitamins: Secondary | ICD-10-CM | POA: Diagnosis not present

## 2023-11-25 ENCOUNTER — Encounter (INDEPENDENT_AMBULATORY_CARE_PROVIDER_SITE_OTHER): Payer: Self-pay

## 2023-11-25 DIAGNOSIS — N1832 Chronic kidney disease, stage 3b: Secondary | ICD-10-CM | POA: Diagnosis not present

## 2023-11-25 DIAGNOSIS — E538 Deficiency of other specified B group vitamins: Secondary | ICD-10-CM | POA: Diagnosis not present

## 2023-11-25 DIAGNOSIS — E785 Hyperlipidemia, unspecified: Secondary | ICD-10-CM | POA: Diagnosis not present

## 2023-11-25 DIAGNOSIS — E1121 Type 2 diabetes mellitus with diabetic nephropathy: Secondary | ICD-10-CM | POA: Diagnosis not present

## 2023-11-26 ENCOUNTER — Other Ambulatory Visit: Payer: Self-pay

## 2023-11-26 ENCOUNTER — Other Ambulatory Visit: Payer: Self-pay | Admitting: Internal Medicine

## 2023-11-26 DIAGNOSIS — C3431 Malignant neoplasm of lower lobe, right bronchus or lung: Secondary | ICD-10-CM

## 2023-11-26 MED ORDER — OSIMERTINIB MESYLATE 80 MG PO TABS
80.0000 mg | ORAL_TABLET | Freq: Every day | ORAL | 1 refills | Status: DC
  Filled 2023-11-26 (×2): qty 30, 30d supply, fill #0
  Filled 2023-12-24 – 2023-12-25 (×2): qty 30, 30d supply, fill #1

## 2023-11-26 NOTE — Progress Notes (Signed)
 Specialty Pharmacy Refill Coordination Note  Stacy Cook is a 88 y.o. female contacted today regarding refills of specialty medication(s) Eltrombopag  Olamine (PROMACTA ); Osimertinib  Mesylate (TAGRISSO )   Patient requested (Patient-Rptd) Delivery   Delivery date: 11/29/23   Verified address: (Patient-Rptd) [address]. [place], [place].   Medication will be filled on 08.20.25 or 08.21.25 depending on when tagrisso  refill comes in.

## 2023-11-27 ENCOUNTER — Other Ambulatory Visit: Payer: Self-pay

## 2023-11-28 DIAGNOSIS — I2510 Atherosclerotic heart disease of native coronary artery without angina pectoris: Secondary | ICD-10-CM | POA: Diagnosis not present

## 2023-11-28 DIAGNOSIS — Z85118 Personal history of other malignant neoplasm of bronchus and lung: Secondary | ICD-10-CM | POA: Diagnosis not present

## 2023-11-28 DIAGNOSIS — D696 Thrombocytopenia, unspecified: Secondary | ICD-10-CM | POA: Diagnosis not present

## 2023-11-28 DIAGNOSIS — I129 Hypertensive chronic kidney disease with stage 1 through stage 4 chronic kidney disease, or unspecified chronic kidney disease: Secondary | ICD-10-CM | POA: Diagnosis not present

## 2023-11-28 DIAGNOSIS — Z85020 Personal history of malignant carcinoid tumor of stomach: Secondary | ICD-10-CM | POA: Diagnosis not present

## 2023-11-28 DIAGNOSIS — E1122 Type 2 diabetes mellitus with diabetic chronic kidney disease: Secondary | ICD-10-CM | POA: Diagnosis not present

## 2023-11-28 DIAGNOSIS — E1151 Type 2 diabetes mellitus with diabetic peripheral angiopathy without gangrene: Secondary | ICD-10-CM | POA: Diagnosis not present

## 2023-11-28 DIAGNOSIS — Z1331 Encounter for screening for depression: Secondary | ICD-10-CM | POA: Diagnosis not present

## 2023-11-28 DIAGNOSIS — Z0000 Encounter for general adult medical examination without abnormal findings: Secondary | ICD-10-CM | POA: Diagnosis not present

## 2023-11-28 DIAGNOSIS — J90 Pleural effusion, not elsewhere classified: Secondary | ICD-10-CM | POA: Diagnosis not present

## 2023-12-13 ENCOUNTER — Telehealth: Payer: Self-pay | Admitting: Internal Medicine

## 2023-12-13 NOTE — Telephone Encounter (Signed)
 Called patient to change appointment due to provider being on FMLA- spoke to patient and she states change to APP is ok for her.

## 2023-12-16 DIAGNOSIS — D692 Other nonthrombocytopenic purpura: Secondary | ICD-10-CM | POA: Diagnosis not present

## 2023-12-16 DIAGNOSIS — I69351 Hemiplegia and hemiparesis following cerebral infarction affecting right dominant side: Secondary | ICD-10-CM | POA: Diagnosis not present

## 2023-12-16 DIAGNOSIS — J910 Malignant pleural effusion: Secondary | ICD-10-CM | POA: Diagnosis not present

## 2023-12-16 DIAGNOSIS — I129 Hypertensive chronic kidney disease with stage 1 through stage 4 chronic kidney disease, or unspecified chronic kidney disease: Secondary | ICD-10-CM | POA: Diagnosis not present

## 2023-12-16 DIAGNOSIS — I4891 Unspecified atrial fibrillation: Secondary | ICD-10-CM | POA: Diagnosis not present

## 2023-12-16 DIAGNOSIS — N1832 Chronic kidney disease, stage 3b: Secondary | ICD-10-CM | POA: Diagnosis not present

## 2023-12-16 DIAGNOSIS — E1122 Type 2 diabetes mellitus with diabetic chronic kidney disease: Secondary | ICD-10-CM | POA: Diagnosis not present

## 2023-12-16 DIAGNOSIS — I2510 Atherosclerotic heart disease of native coronary artery without angina pectoris: Secondary | ICD-10-CM | POA: Diagnosis not present

## 2023-12-16 DIAGNOSIS — E1151 Type 2 diabetes mellitus with diabetic peripheral angiopathy without gangrene: Secondary | ICD-10-CM | POA: Diagnosis not present

## 2023-12-19 ENCOUNTER — Other Ambulatory Visit: Payer: Self-pay

## 2023-12-24 ENCOUNTER — Encounter (INDEPENDENT_AMBULATORY_CARE_PROVIDER_SITE_OTHER): Payer: Self-pay

## 2023-12-24 ENCOUNTER — Other Ambulatory Visit: Payer: Self-pay | Admitting: Internal Medicine

## 2023-12-24 ENCOUNTER — Other Ambulatory Visit: Payer: Self-pay

## 2023-12-24 DIAGNOSIS — D693 Immune thrombocytopenic purpura: Secondary | ICD-10-CM

## 2023-12-24 MED ORDER — ELTROMBOPAG OLAMINE 25 MG PO TABS
25.0000 mg | ORAL_TABLET | Freq: Every day | ORAL | 2 refills | Status: DC
  Filled 2023-12-24 – 2023-12-25 (×2): qty 30, 30d supply, fill #0
  Filled 2024-01-29: qty 30, 30d supply, fill #1
  Filled 2024-02-27: qty 30, 30d supply, fill #2

## 2023-12-25 ENCOUNTER — Other Ambulatory Visit: Payer: Self-pay

## 2023-12-25 ENCOUNTER — Other Ambulatory Visit: Payer: Self-pay | Admitting: Pharmacy Technician

## 2023-12-25 NOTE — Addendum Note (Signed)
 Addended by: VICCI SELLER A on: 12/25/2023 03:51 PM   Modules accepted: Orders

## 2023-12-25 NOTE — Progress Notes (Signed)
 Specialty Pharmacy Refill Coordination Note  Stacy Cook is a 88 y.o. female contacted today regarding refills of specialty medication(s) Eltrombopag  Olamine (PROMACTA ); Osimertinib  Mesylate (TAGRISSO )   Patient requested Delivery   Delivery date: 12/27/23   Verified address: [address]  [place], [place]   Medication will be filled on 12/26/23. Questionnaire answered

## 2023-12-25 NOTE — Progress Notes (Signed)
 Remote pacemaker transmission.

## 2023-12-26 ENCOUNTER — Other Ambulatory Visit: Payer: Self-pay

## 2024-01-01 ENCOUNTER — Other Ambulatory Visit: Payer: Self-pay

## 2024-01-03 ENCOUNTER — Other Ambulatory Visit: Payer: Self-pay

## 2024-01-03 ENCOUNTER — Ambulatory Visit (INDEPENDENT_AMBULATORY_CARE_PROVIDER_SITE_OTHER): Payer: Self-pay

## 2024-01-03 DIAGNOSIS — I442 Atrioventricular block, complete: Secondary | ICD-10-CM

## 2024-01-03 NOTE — Progress Notes (Signed)
 Specialty Pharmacy Ongoing Clinical Assessment Note  Stacy Cook is a 88 y.o. female who is being followed by the specialty pharmacy service for RxSp Oncology   Patient's specialty medication(s) reviewed today: Eltrombopag  Olamine (PROMACTA ); Osimertinib  Mesylate (TAGRISSO )   Missed doses in the last 4 weeks: 0   Patient/Caregiver did not have any additional questions or concerns.   Therapeutic benefit summary: Patient is achieving benefit   Adverse events/side effects summary: No adverse events/side effects   Patient's therapy is appropriate to: Continue    Goals Addressed             This Visit's Progress    Slow Disease Progression   On track    Patient is on track. Patient will maintain adherence. Per provider note from 10/18/23, scan appearance favors transient unilateral edema or inflammation in the right lung over lymphangitic spread of tumor.        Follow up: 3 months  Powell CHRISTELLA Gallus Specialty Pharmacist

## 2024-01-06 LAB — CUP PACEART REMOTE DEVICE CHECK
Battery Remaining Longevity: 36 mo
Battery Voltage: 2.92 V
Brady Statistic AS VP Percent: 95.59 %
Brady Statistic AS VS Percent: 0.01 %
Brady Statistic RV Percent Paced: 99.49 %
Date Time Interrogation Session: 20250926120500
Implantable Pulse Generator Implant Date: 20200901
Lead Channel Impedance Value: 510 Ohm
Lead Channel Pacing Threshold Amplitude: 0.875 V
Lead Channel Pacing Threshold Pulse Width: 0.24 ms
Lead Channel Sensing Intrinsic Amplitude: 6.863 mV
Lead Channel Setting Pacing Amplitude: 1.375
Lead Channel Setting Pacing Pulse Width: 0.24 ms
Lead Channel Setting Sensing Sensitivity: 2.8 mV

## 2024-01-08 ENCOUNTER — Ambulatory Visit
Admission: RE | Admit: 2024-01-08 | Discharge: 2024-01-08 | Disposition: A | Discharge status: Home / Self Care | Source: Ambulatory Visit | Attending: Internal Medicine | Admitting: Internal Medicine

## 2024-01-08 DIAGNOSIS — C3431 Malignant neoplasm of lower lobe, right bronchus or lung: Secondary | ICD-10-CM | POA: Insufficient documentation

## 2024-01-08 DIAGNOSIS — J90 Pleural effusion, not elsewhere classified: Secondary | ICD-10-CM | POA: Diagnosis not present

## 2024-01-08 DIAGNOSIS — R188 Other ascites: Secondary | ICD-10-CM | POA: Insufficient documentation

## 2024-01-08 DIAGNOSIS — R222 Localized swelling, mass and lump, trunk: Secondary | ICD-10-CM | POA: Diagnosis not present

## 2024-01-08 DIAGNOSIS — C786 Secondary malignant neoplasm of retroperitoneum and peritoneum: Secondary | ICD-10-CM | POA: Diagnosis not present

## 2024-01-08 DIAGNOSIS — I2510 Atherosclerotic heart disease of native coronary artery without angina pectoris: Secondary | ICD-10-CM | POA: Insufficient documentation

## 2024-01-08 DIAGNOSIS — J941 Fibrothorax: Secondary | ICD-10-CM | POA: Diagnosis not present

## 2024-01-08 DIAGNOSIS — C3490 Malignant neoplasm of unspecified part of unspecified bronchus or lung: Secondary | ICD-10-CM | POA: Diagnosis not present

## 2024-01-08 DIAGNOSIS — I700 Atherosclerosis of aorta: Secondary | ICD-10-CM | POA: Insufficient documentation

## 2024-01-08 LAB — GLUCOSE, CAPILLARY: Glucose-Capillary: 80 mg/dL (ref 70–99)

## 2024-01-08 MED ORDER — FLUDEOXYGLUCOSE F - 18 (FDG) INJECTION
5.2000 | Freq: Once | INTRAVENOUS | Status: AC | PRN
Start: 2024-01-08 — End: 2024-01-08
  Administered 2024-01-08: 5.28 via INTRAVENOUS

## 2024-01-08 NOTE — Progress Notes (Signed)
 Remote PPM Transmission

## 2024-01-09 ENCOUNTER — Ambulatory Visit: Payer: Self-pay | Admitting: Internal Medicine

## 2024-01-09 DIAGNOSIS — C3431 Malignant neoplasm of lower lobe, right bronchus or lung: Secondary | ICD-10-CM

## 2024-01-09 NOTE — Progress Notes (Signed)
 I tried to reach the patient-unable to leave a voicemail regarding the results of the PET scan.  Will plan to get liquid biopsy at next visit, will inform Hayley- GB

## 2024-01-10 ENCOUNTER — Ambulatory Visit: Payer: Self-pay | Admitting: Cardiology

## 2024-01-10 NOTE — Progress Notes (Deleted)
 Cardiology Office Note Date:  01/10/2024  Patient ID:  Stacy Cook, DOB 12/12/1935, MRN [id number] PCP:  Stacy Ophelia JINNY DOUGLAS, MD  Cardiologist:  Dr. Hobart (inactive) Electrophysiologist: Dr. Kelsie >>> Dr. Cindie    Chief Complaint:   annual device visit  History of Present Illness: Stacy Cook is a 88 y.o. female with history of VHD/AS s/p TAVR (12/02/18) > CHB w/PPM,  CAD (PCI to RCA/LCx 2019),  lung Cancer, (RLL lobectomy 2017, recurrent malignant pleural effusions),   CKD III, DM, HTN, HLD, stroke,  HFpEF AFib  Pre TAVR CTs revealed a new pleural effusion suspicious fo recurrent malignancy. Cytology confirmed the presence of malignant cells and recurrence of her lung cancer. She was referred to Dr. Amey with oncology and her TAVR was delayed.  treated with oral chemotherapy and has had two thoracenteses   Ongoing SOB felt to have component from her severe AS  TAVR 12/02/2018, complicated by stroke > discharged to rehab > CHB > back to the hospital > PPM  She has followed with gen cards team as well as EP  More recently:  Saw Dr. Cindie 09/28/22, stable pacer function, low AF burden, no changes made  She saw T. Lucien, PA-C 06/06/23, reported some symptoms or orthopnea, though no DOE, no CP, palpitations  I saw her 11/12/23 All in all sounds like she is at her baseline Mentions she has outlived just about everyone, and at this time in her life on days she doesn't feel much like doing anything, doesn't! Generally paces her self, gets her ADLs taken care of, has support from her son. No near syncope or syncope Mild orthostatic dizziness. No rest SOB, some DOE that is her baseline AM/VP 80 % With industry support A3window fixed 650 AV delay increased to 60ms  Resulting ins much improved 1:1 AV synchrony Planned for 28mo follow up post programming changes made  TODAY  *** symptoms *** AFib, symptoms, burden *** eliquis , dose, bleeding, labs *** HFpEF,  volume  Device information MDT Micra AV implanted 12/09/2018   Past Medical History:  Diagnosis Date   Abdominal pain    Acid reflux 03/24/2015   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   Allergic genetic state    Anemia    Arteriosclerosis of coronary artery 03/24/2015   Arthritis    back, hands (10/15/2017)   Asthma    B12 deficiency anemia    Basal cell carcinoma 01/01/2022   glabella, [facility]   Basal cell carcinoma 05/03/2022   R ant deltoid, [facility]   Basal cell carcinoma 05/03/2022   Xyphoid excised 06/12/2022   Bile reflux gastritis    CAD (coronary artery disease)    Cancer of right lung (HCC) 05/09/2015   Dr. Volney performed Right lower lobe lobectomy.    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 08/03/2016   Carotid stenosis 02/07/2016   Cataract cortical, senile    Chest pain    Colon polyp    Degeneration of intervertebral disc of lumbar region 03/11/2014   Diabetes (HCC)    Gastric adenocarcinoma (HCC)    GERD (gastroesophageal reflux disease)    also, history of ulcers   History of kidney stones    History of stroke    HLD (hyperlipidemia) 08/24/2013   Hypertension    Malignant tumor of stomach (HCC) 07/2016   Adenocarcinoma, diffuse, poorly differentiated, signet ring, stage I   Neuritis or radiculitis due to rupture of lumbar intervertebral disc 09/10/2014  Neuroendocrine tumor 03/24/2015   Neuroma    Osteoporosis    Peripheral vascular disease    PONV (postoperative nausea and vomiting)    Presence of permanent cardiac pacemaker 12/09/2018   Primary malignant neoplasm of right lower lobe of lung (HCC) 12/02/2015   Renal insufficiency    Renal stone    S/P TAVR (transcatheter aortic valve replacement)    Sciatica    Severe aortic stenosis    Skin cancer    cut/burned LUE; cut off right eye/nose & cut off chest (10/15/2017)   Stomach ulcer    Thrombocytopenia    Type 2 diabetes, diet controlled (HCC)    no RX since stomach OR 07/2016 (10/15/2017)     Past Surgical History:  Procedure Laterality Date   APPENDECTOMY     CARDIAC CATHETERIZATION  X2 before 10/15/2017   CARDIAC VALVE REPLACEMENT     CATARACT EXTRACTION W/PHACO Left 04/04/2017   Procedure: CATARACT EXTRACTION PHACO AND INTRAOCULAR LENS PLACEMENT (IOC);  Surgeon: Mittie Gaskin, MD;  Location: [facility];  Service: Ophthalmology;  Laterality: Left;  Lot # [id number] H US  1:00 Ap 25% CDE 8.54   CATARACT EXTRACTION W/PHACO Right 05/15/2017   Procedure: CATARACT EXTRACTION PHACO AND INTRAOCULAR LENS PLACEMENT (IOC);  Surgeon: Mittie Gaskin, MD;  Location: [facility];  Service: Ophthalmology;  Laterality: Right;  US  01:10 AP% 18.3 CDE 12.91 Fluid pack lot # [id number]   COLONOSCOPY     CORONARY ATHERECTOMY N/A 10/15/2017   Procedure: CORONARY ATHERECTOMY;  Surgeon: Verlin Lonni BIRCH, MD;  Location: [facility];  Service: Cardiovascular;  Laterality: N/A;   CORONARY STENT INTERVENTION N/A 10/15/2017   Procedure: CORONARY STENT INTERVENTION;  Surgeon: Verlin Lonni BIRCH, MD;  Location: [facility];  Service: Cardiovascular;  Laterality: N/A;   CYSTOSCOPY/URETEROSCOPY/HOLMIUM LASER/STENT PLACEMENT Left 08/03/2019   Procedure: CYSTOSCOPY/URETEROSCOPY/HOLMIUM LASER/STENT PLACEMENT;  Surgeon: Penne Knee, MD;  Location: [facility];  Service: Urology;  Laterality: Left;   ESOPHAGOGASTRODUODENOSCOPY     ESOPHAGOGASTRODUODENOSCOPY (EGD) WITH PROPOFOL  N/A 03/27/2016   Procedure: ESOPHAGOGASTRODUODENOSCOPY (EGD) WITH PROPOFOL ;  Surgeon: Gladis RAYMOND Mariner, MD;  Location: [facility];  Service: Endoscopy;  Laterality: N/A;   ESOPHAGOGASTRODUODENOSCOPY (EGD) WITH PROPOFOL  N/A 05/28/2016   Procedure: ESOPHAGOGASTRODUODENOSCOPY (EGD) WITH PROPOFOL ;  Surgeon: Gladis RAYMOND Mariner, MD;  Location: [facility] ENDOSCOPY;  Service: Endoscopy;  Laterality: N/A;   ESOPHAGOGASTRODUODENOSCOPY (EGD) WITH PROPOFOL  N/A 09/25/2016   Procedure: ESOPHAGOGASTRODUODENOSCOPY (EGD) WITH  PROPOFOL ;  Surgeon: Dellie Louanne MATSU, MD;  Location: [facility] ENDOS[facility];  Service: Endoscopy;  Laterality: N/A;   ESOPHAGOGASTRODUODENOSCOPY (EGD) WITH PROPOFOL  N/A 12/18/2016   Procedure: ESOPHAGOGASTRODUODENOSCOPY (EGD) WITH PROPOFOL ;  Surgeon: Dellie Louanne MATSU, MD;  Location: [facility];  Service: Endoscopy;  Laterality: N/A;   ESOPHAGOGASTRODUODENOSCOPY (EGD) WITH PROPOFOL  N/A 01/23/2017   Procedure: ESOPHAGOGASTRODUODENOSCOPY (EGD) WITH PROPOFOL ;  Surgeon: Dellie Louanne MATSU, MD;  Location: [facility];  Service: Endoscopy;  Laterality: N/A;   ESOPHAGOGASTRODUODENOSCOPY (EGD) WITH PROPOFOL  N/A 03/20/2017   Procedure: ESOPHAGOGASTRODUODENOSCOPY (EGD) WITH PROPOFOL ;  Surgeon: Dellie Louanne MATSU, MD;  Location: [facility];  Service: Endoscopy;  Laterality: N/A;   ESOPHAGOGASTRODUODENOSCOPY (EGD) WITH PROPOFOL  N/A 09/23/2020   Procedure: ESOPHAGOGASTRODUODENOSCOPY (EGD) WITH PROPOFOL ;  Surgeon: Maryruth Ole DASEN, MD;  Location: [facility] ENDOSCOPY;  Service: Endoscopy;  Laterality: N/A;  KC SAYS AMPICILLIN IS NEEDED   FRACTURE SURGERY     INSERT / REPLACE / REMOVE PACEMAKER  12/09/2018   PACEMAKER LEADLESS INSERTION N/A 12/09/2018   Procedure: PACEMAKER LEADLESS INSERTION;  Surgeon: Kelsie Agent, MD;  [facility];  Service: Cardiovascular;  Laterality: N/A;   PARTIAL GASTRECTOMY N/A 08/03/2016   Hemigastrectomy, Billroth I reconstruction Surgeon: Louanne KANDICE Muse, MD;  Location: [facility];  Service: General;  Laterality: N/A;   RIGHT/LEFT HEART CATH AND CORONARY ANGIOGRAPHY Bilateral 09/19/2017   Procedure: RIGHT/LEFT HEART CATH AND CORONARY ANGIOGRAPHY;  Surgeon: Florencio Cara BIRCH, MD;  Location: [facility];  Service: Cardiovascular;  Laterality: Bilateral;   SHOULDER ARTHROSCOPY W/ CAPSULAR REPAIR Right    SKIN CANCER EXCISION     cut/burned LUE; cut off right eye/nose & cut off chest (10/15/2017)   TEE WITHOUT CARDIOVERSION N/A  12/02/2018   Procedure: TRANSESOPHAGEAL ECHOCARDIOGRAM (TEE);  Surgeon: Verlin Lonni BIRCH, MD;  Location: [facility] INVASIVE CV LAB;  Service: Open Heart Surgery;  Laterality: N/A;   THORACOTOMY Right 05/09/2015   Procedure: THORACOTOMY, RIGHT LOWER LOBECTOMY, BRONCHOSCOPY;  Surgeon: Evalene Glasser, MD;  Location: [facility];  Service: Thoracic;  Laterality: Right;   TONSILLECTOMY  1944   TRANSCATHETER AORTIC VALVE REPLACEMENT, TRANSFEMORAL N/A 12/02/2018   Procedure: TRANSCATHETER AORTIC VALVE REPLACEMENT, TRANSFEMORAL;  Surgeon: Verlin Lonni BIRCH, MD;  Location: [facility];  Service: Open Heart Surgery;  Laterality: N/A;   TUBAL LIGATION     UPPER GI ENDOSCOPY N/A 08/03/2016   Procedure: UPPER  ENDOSCOPY;  Surgeon: Louanne KANDICE Muse, MD;  Location: [facility];  Service: General;  Laterality: N/A;   VAGINAL HYSTERECTOMY     WRIST FRACTURE SURGERY Right     Current Outpatient Medications  Medication Sig Dispense Refill   apixaban  (ELIQUIS ) 2.5 MG TABS tablet Take 1 tablet (2.5 mg total) by mouth 2 (two) times daily. 180 tablet 1   cholecalciferol  (VITAMIN D ) 1000 UNITS tablet Take 1,000 Units by mouth daily.     cyanocobalamin  1000 MCG tablet Take 1,000 mcg by mouth daily.      eltrombopag  (PROMACTA ) 25 MG tablet Take 1 tablet (25 mg total) by mouth daily. Take on an empty stomach, 1 hour before a meal or 2 hours after. 30 tablet 2   ezetimibe  (ZETIA ) 10 MG tablet Take 1 tablet (10 mg total) by mouth daily. 90 tablet 3   ferrous sulfate  325 (65 FE) MG tablet Take 325 mg by mouth daily with breakfast.     isosorbide  mononitrate (IMDUR ) 30 MG 24 hr tablet Take 1 tablet (30 mg total) by mouth daily. 90 tablet 3   Magnesium  100 MG TABS Take 1 tablet by mouth daily at 6 (six) AM.     metoprolol  succinate (TOPROL -XL) 50 MG 24 hr tablet Take 1 tablet (50 mg total) by mouth every evening. Take with or immediately following a meal. 30 tablet 0   osimertinib  mesylate (TAGRISSO ) 80 MG tablet  Take 1 tablet (80 mg total) by mouth daily. 30 tablet 1   potassium citrate  (UROCIT-K ) 10 MEQ (1080 MG) SR tablet Take 1 tablet by mouth once daily 90 tablet 0   No current facility-administered medications for this visit.    Allergies:   Mirtazapine, Pneumococcal vaccine, Statins, and Sulfa antibiotics   Social History:  The patient  reports that she has never smoked. She has never used smokeless tobacco. She reports that she does not currently use alcohol. She reports that she does not use drugs.   Family History:  The patient's family history includes Aortic aneurysm in her mother; Diabetes in an other family member; Hypertension in her father and mother; Kidney Stones in her mother.  ROS:  Please see the history of present  illness.    All other systems are reviewed and otherwise negative.   PHYSICAL EXAM:  VS:  There were no vitals taken for this visit. BMI: There is no height or weight on file to calculate BMI. thin, well developed, in no acute distress, chronically ill appearing HEENT: normocephalic, atraumatic Neck: no JVD, carotid bruits or masses Cardiac: *** RRR; 2/6 SM R sternum, no rubs, or gallops Lungs: *** decreased R base, otherwise CTA b/l, no wheezing, rhonchi or rales Abd: soft, nontender MS: no deformity, advanced  atrophy Ext: *** no edema Skin: warm and dry, no rash Neuro:  No gross deficits appreciated Psych: euthymic mood, full affect   EKG:  not done today   Device interrogation done today and reviewed by myself:  Battery and electrode measurements are  *** good ***  02/04/23 Echo IMPRESSIONS   1. Left ventricular ejection fraction, by estimation, is 60 to 65%. The  left ventricle has normal function. The left ventricle has no regional  wall motion abnormalities. Left ventricular diastolic parameters are  consistent with Grade II diastolic  dysfunction (pseudonormalization). Elevated left atrial pressure.   2. Right ventricular systolic function is  normal. The right ventricular  size is normal. Tricuspid regurgitation signal is inadequate for assessing  PA pressure.   3. Left atrial size was moderately dilated.   4. The mitral valve is degenerative. Mild mitral valve regurgitation. No  evidence of mitral stenosis. Moderate to severe mitral annular  calcification.   5. The aortic valve has been repaired/replaced. Aortic valve  regurgitation is not visualized. There is a 23 mm CoreValve-Evolut Pro  prosthetic (TAVR) valve present in the aortic position. Procedure Date:  2020. Echo findings are consistent with normal  structure and function of the aortic valve prosthesis. Aortic valve mean  gradient measures 9.4 mmHg. Aortic valve Vmax measures 2.02 m/s. Aortic  valve acceleration time measures 74 msec.   6. The inferior vena cava is normal in size with greater than 50%  respiratory variability, suggesting right atrial pressure of 3 mmHg.     02/05/22: TTE 1. Left ventricular ejection fraction, by estimation, is 60 to 65%. The  left ventricle has normal function. The left ventricle has no regional  wall motion abnormalities. There is mild left ventricular hypertrophy.  Left ventricular diastolic parameters  are indeterminate.   2. Right ventricular systolic function is normal. The right ventricular  size is normal. There is mildly elevated pulmonary artery systolic  pressure. The estimated right ventricular systolic pressure is 44.5 mmHg.   3. Left atrial size was moderately dilated.   4. The mitral valve is normal in structure. Mild mitral valve  regurgitation. No evidence of mitral stenosis. Severe mitral annular  calcification.   5. The aortic valve has been repaired/replaced. Aortic valve  regurgitation is not visualized. No aortic stenosis is present. There is a  23 mm CoreValve-Evolut Pro prosthetic (TAVR) valve present in the aortic  position. Procedure Date: 12/02/2018. Echo  findings are consistent with normal structure  and function of the aortic  valve prosthesis. Aortic valve mean gradient measures 9.4 mmHg. Aortic  valve Vmax measures 2.16 m/s.   6. The inferior vena cava is normal in size with greater than 50%  respiratory variability, suggesting right atrial pressure of 3 mmHg.   Comparison(s): No significant change from prior study.    10/15/2017: LHC/PCI Prox RCA to Mid RCA lesion is 50% stenosed. Dist RCA lesion is 90% stenosed. A drug-eluting stent was successfully placed using  a STENT SYNERGY DES [id number]. Post intervention, there is a 0% residual stenosis. Prox Cx to Mid Cx lesion is 95% stenosed. Prox LAD lesion is 70% stenosed. A drug-eluting stent was successfully placed using a STENT SYNERGY DES 3X20. Post intervention, there is a 0% residual stenosis.   1. Severe stenosis mid Circumflex. Successful PTCA/orbital atherectomy/DES placement 2. Severe stenosis distal RCA. Successful PTCA/DES x 1 distal RCA   Recommendations: Will continue DAPT with ASA and Plavix  for one year. Her aortic stenosis is felt to be moderately severe by echo last week. Peak to peak gradient today by cath 17 mmHg. I reviewed her echo images with the TAVR team. We will hold off on TAVR right now. She will likely need TAVR within the next 6-18 months.    Recommend uninterrupted dual antiplatelet therapy with Aspirin  81mg  daily for a minimum of 12 months (ACS - Class I recommendation). (Plavix  in addition to ASA).     Recent Labs: 10/18/2023: ALT 9; BUN 30; Creatinine 1.58; Hemoglobin 10.9; Platelet Count 139; Potassium 4.2; Sodium 134  No results found for requested labs within last 365 days.   CrCl cannot be calculated (Patient's most recent lab result is older than the maximum 21 days allowed.).   Wt Readings from Last 3 Encounters:  11/12/23 99 lb (44.9 kg)  10/18/23 99 lb 14.4 oz (45.3 kg)  07/22/23 100 lb 6.4 oz (45.5 kg)     Other studies reviewed: Additional studies/records reviewed today include:  summarized above  ASSESSMENT AND PLAN:  PPM *** intact function *** no programming changes made  AFib CHA2DS2Vasc is 8, on Eliquis , *** appropriately dosed *** low burden by symptoms, no cardiac awareness At her baseline functional status  VHD S/p TAVR Functioning well by her last echo 2024 C/w gen cards team   HFpEF Chronic CHF *** No symptoms or exam finding of volume OL  C/w gen cards team  6.  Secondary hypercoagulable state    Disposition:  f/u in ***, sooner if needed.  Current medicines are reviewed at length with the patient today.  The patient did not have any concerns regarding medicines.  Bonney Charlies Arthur, PA-C 01/10/2024 1:33 PM     [facility] [address] [place] [address] [phone] (office)  [phone] (fax)

## 2024-01-13 ENCOUNTER — Ambulatory Visit: Attending: Physician Assistant | Admitting: Physician Assistant

## 2024-01-13 ENCOUNTER — Other Ambulatory Visit: Payer: Self-pay

## 2024-01-13 ENCOUNTER — Emergency Department

## 2024-01-13 ENCOUNTER — Telehealth: Payer: Self-pay | Admitting: *Deleted

## 2024-01-13 ENCOUNTER — Emergency Department
Admission: EM | Admit: 2024-01-13 | Discharge: 2024-01-23 | Disposition: A | Discharge status: Skilled Nursing Facility | Attending: Emergency Medicine | Admitting: Emergency Medicine

## 2024-01-13 DIAGNOSIS — M25551 Pain in right hip: Secondary | ICD-10-CM | POA: Insufficient documentation

## 2024-01-13 DIAGNOSIS — R531 Weakness: Secondary | ICD-10-CM | POA: Diagnosis not present

## 2024-01-13 DIAGNOSIS — M8580 Other specified disorders of bone density and structure, unspecified site: Secondary | ICD-10-CM | POA: Diagnosis not present

## 2024-01-13 DIAGNOSIS — I509 Heart failure, unspecified: Secondary | ICD-10-CM | POA: Diagnosis not present

## 2024-01-13 DIAGNOSIS — S51011A Laceration without foreign body of right elbow, initial encounter: Secondary | ICD-10-CM | POA: Insufficient documentation

## 2024-01-13 DIAGNOSIS — W19XXXA Unspecified fall, initial encounter: Secondary | ICD-10-CM

## 2024-01-13 DIAGNOSIS — E1122 Type 2 diabetes mellitus with diabetic chronic kidney disease: Secondary | ICD-10-CM | POA: Insufficient documentation

## 2024-01-13 DIAGNOSIS — N189 Chronic kidney disease, unspecified: Secondary | ICD-10-CM | POA: Diagnosis not present

## 2024-01-13 DIAGNOSIS — Z23 Encounter for immunization: Secondary | ICD-10-CM | POA: Diagnosis not present

## 2024-01-13 DIAGNOSIS — W1839XA Other fall on same level, initial encounter: Secondary | ICD-10-CM | POA: Diagnosis not present

## 2024-01-13 DIAGNOSIS — S59901A Unspecified injury of right elbow, initial encounter: Secondary | ICD-10-CM | POA: Diagnosis present

## 2024-01-13 DIAGNOSIS — K4020 Bilateral inguinal hernia, without obstruction or gangrene, not specified as recurrent: Secondary | ICD-10-CM | POA: Diagnosis not present

## 2024-01-13 DIAGNOSIS — R102 Pelvic and perineal pain unspecified side: Secondary | ICD-10-CM | POA: Diagnosis not present

## 2024-01-13 DIAGNOSIS — M87051 Idiopathic aseptic necrosis of right femur: Secondary | ICD-10-CM | POA: Diagnosis not present

## 2024-01-13 DIAGNOSIS — M79604 Pain in right leg: Secondary | ICD-10-CM | POA: Diagnosis not present

## 2024-01-13 DIAGNOSIS — Z85118 Personal history of other malignant neoplasm of bronchus and lung: Secondary | ICD-10-CM | POA: Insufficient documentation

## 2024-01-13 DIAGNOSIS — Z7901 Long term (current) use of anticoagulants: Secondary | ICD-10-CM | POA: Insufficient documentation

## 2024-01-13 DIAGNOSIS — S0990XA Unspecified injury of head, initial encounter: Secondary | ICD-10-CM | POA: Diagnosis not present

## 2024-01-13 DIAGNOSIS — I10 Essential (primary) hypertension: Secondary | ICD-10-CM | POA: Diagnosis not present

## 2024-01-13 DIAGNOSIS — J941 Fibrothorax: Secondary | ICD-10-CM | POA: Diagnosis not present

## 2024-01-13 DIAGNOSIS — I517 Cardiomegaly: Secondary | ICD-10-CM | POA: Diagnosis not present

## 2024-01-13 DIAGNOSIS — M160 Bilateral primary osteoarthritis of hip: Secondary | ICD-10-CM | POA: Diagnosis not present

## 2024-01-13 DIAGNOSIS — S76011A Strain of muscle, fascia and tendon of right hip, initial encounter: Secondary | ICD-10-CM | POA: Diagnosis not present

## 2024-01-13 DIAGNOSIS — M85851 Other specified disorders of bone density and structure, right thigh: Secondary | ICD-10-CM | POA: Diagnosis not present

## 2024-01-13 DIAGNOSIS — I6523 Occlusion and stenosis of bilateral carotid arteries: Secondary | ICD-10-CM | POA: Diagnosis not present

## 2024-01-13 DIAGNOSIS — M79651 Pain in right thigh: Secondary | ICD-10-CM | POA: Diagnosis not present

## 2024-01-13 DIAGNOSIS — S199XXA Unspecified injury of neck, initial encounter: Secondary | ICD-10-CM | POA: Diagnosis not present

## 2024-01-13 DIAGNOSIS — C3490 Malignant neoplasm of unspecified part of unspecified bronchus or lung: Secondary | ICD-10-CM | POA: Diagnosis not present

## 2024-01-13 DIAGNOSIS — S3993XA Unspecified injury of pelvis, initial encounter: Secondary | ICD-10-CM | POA: Diagnosis not present

## 2024-01-13 LAB — COMPREHENSIVE METABOLIC PANEL WITH GFR
ALT: 9 U/L (ref 0–44)
AST: 18 U/L (ref 15–41)
Albumin: 3.4 g/dL — ABNORMAL LOW (ref 3.5–5.0)
Alkaline Phosphatase: 63 U/L (ref 38–126)
Anion gap: 11 (ref 5–15)
BUN: 32 mg/dL — ABNORMAL HIGH (ref 8–23)
CO2: 20 mmol/L — ABNORMAL LOW (ref 22–32)
Calcium: 8.9 mg/dL (ref 8.9–10.3)
Chloride: 106 mmol/L (ref 98–111)
Creatinine, Ser: 1.27 mg/dL — ABNORMAL HIGH (ref 0.44–1.00)
GFR, Estimated: 41 mL/min — ABNORMAL LOW (ref >60)
Glucose, Bld: 99 mg/dL (ref 70–99)
Potassium: 4.4 mmol/L (ref 3.5–5.1)
Sodium: 137 mmol/L (ref 135–145)
Total Bilirubin: 0.8 mg/dL (ref 0.0–1.2)
Total Protein: 6 g/dL — ABNORMAL LOW (ref 6.5–8.1)

## 2024-01-13 LAB — CBC
HCT: 35.4 % — ABNORMAL LOW (ref 36.0–46.0)
Hemoglobin: 11.6 g/dL — ABNORMAL LOW (ref 12.0–15.0)
MCH: 30.4 pg (ref 26.0–34.0)
MCHC: 32.8 g/dL (ref 30.0–36.0)
MCV: 92.9 fL (ref 80.0–100.0)
Platelets: 113 K/uL — ABNORMAL LOW (ref 150–400)
RBC: 3.81 MIL/uL — ABNORMAL LOW (ref 3.87–5.11)
RDW: 13.9 % (ref 11.5–15.5)
WBC: 6 K/uL (ref 4.0–10.5)
nRBC: 0 % (ref 0.0–0.2)

## 2024-01-13 LAB — TROPONIN I (HIGH SENSITIVITY): Troponin I (High Sensitivity): 6 ng/L (ref <18)

## 2024-01-13 MED ORDER — VITAMIN D 25 MCG (1000 UNIT) PO TABS
1000.0000 [IU] | ORAL_TABLET | Freq: Every day | ORAL | Status: DC
Start: 2024-01-14 — End: 2024-01-23
  Administered 2024-01-14 – 2024-01-23 (×10): 1000 [IU] via ORAL
  Filled 2024-01-13 (×10): qty 1

## 2024-01-13 MED ORDER — MORPHINE SULFATE (PF) 2 MG/ML IV SOLN
2.0000 mg | Freq: Once | INTRAVENOUS | Status: AC
  Administered 2024-01-13: 2 mg via INTRAVENOUS
  Filled 2024-01-13: qty 1

## 2024-01-13 MED ORDER — MAGNESIUM OXIDE -MG SUPPLEMENT 400 (240 MG) MG PO TABS
200.0000 mg | ORAL_TABLET | Freq: Every day | ORAL | Status: DC
  Administered 2024-01-13 – 2024-01-23 (×11): 200 mg via ORAL
  Filled 2024-01-13 (×11): qty 1

## 2024-01-13 MED ORDER — SODIUM CHLORIDE 0.9 % IV BOLUS
500.0000 mL | Freq: Once | INTRAVENOUS | Status: AC
  Administered 2024-01-13: 500 mL via INTRAVENOUS

## 2024-01-13 MED ORDER — APIXABAN 2.5 MG PO TABS
2.5000 mg | ORAL_TABLET | Freq: Two times a day (BID) | ORAL | Status: DC
Start: 2024-01-13 — End: 2024-01-23
  Administered 2024-01-13 – 2024-01-23 (×20): 2.5 mg via ORAL
  Filled 2024-01-13 (×23): qty 1

## 2024-01-13 MED ORDER — MAGNESIUM 100 MG PO TABS: 1.0000 | ORAL_TABLET | Freq: Every day | ORAL | Status: DC

## 2024-01-13 MED ORDER — EZETIMIBE 10 MG PO TABS
10.0000 mg | ORAL_TABLET | Freq: Every day | ORAL | Status: DC
  Administered 2024-01-14 – 2024-01-23 (×10): 10 mg via ORAL
  Filled 2024-01-13 (×10): qty 1

## 2024-01-13 MED ORDER — ACETAMINOPHEN 325 MG PO TABS
650.0000 mg | ORAL_TABLET | Freq: Four times a day (QID) | ORAL | Status: DC | PRN
  Administered 2024-01-13: 650 mg via ORAL
  Filled 2024-01-13: qty 2

## 2024-01-13 MED ORDER — ELTROMBOPAG OLAMINE 25 MG PO TABS: 25.0000 mg | ORAL_TABLET | Freq: Every day | ORAL | Status: DC

## 2024-01-13 MED ORDER — FERROUS SULFATE 325 (65 FE) MG PO TABS
325.0000 mg | ORAL_TABLET | Freq: Every day | ORAL | Status: DC
  Administered 2024-01-14 – 2024-01-23 (×10): 325 mg via ORAL
  Filled 2024-01-13 (×10): qty 1

## 2024-01-13 MED ORDER — ISOSORBIDE MONONITRATE ER 60 MG PO TB24
30.0000 mg | ORAL_TABLET | Freq: Every day | ORAL | Status: DC
  Administered 2024-01-14 – 2024-01-23 (×10): 30 mg via ORAL
  Filled 2024-01-13 (×10): qty 1

## 2024-01-13 MED ORDER — METOPROLOL SUCCINATE ER 50 MG PO TB24
50.0000 mg | ORAL_TABLET | Freq: Every evening | ORAL | Status: DC
  Administered 2024-01-13 – 2024-01-22 (×10): 50 mg via ORAL
  Filled 2024-01-13 (×10): qty 1

## 2024-01-13 MED ORDER — TETANUS-DIPHTH-ACELL PERTUSSIS 5-2-15.5 LF-MCG/0.5 IM SUSP
0.5000 mL | Freq: Once | INTRAMUSCULAR | Status: AC
  Administered 2024-01-13: 0.5 mL via INTRAMUSCULAR
  Filled 2024-01-13: qty 0.5

## 2024-01-13 MED ORDER — CYANOCOBALAMIN 500 MCG PO TABS
1000.0000 ug | ORAL_TABLET | Freq: Every day | ORAL | Status: DC
Start: 2024-01-14 — End: 2024-01-23
  Administered 2024-01-14 – 2024-01-23 (×10): 1000 ug via ORAL
  Filled 2024-01-13 (×10): qty 2

## 2024-01-13 NOTE — ED Notes (Signed)
 Pt taken to CT at this time.

## 2024-01-13 NOTE — ED Notes (Signed)
 Skin tear to right lower arm posterior cleansed. Xeroform applied and area rewrapped with clean gauze.  Non bleeding at this time.

## 2024-01-13 NOTE — Telephone Encounter (Signed)
-----   Message from Cindy JONELLE Joe sent at 01/12/2024 11:52 AM EDT ----- Regarding: 10/10 pt follow up- [place]-looks like patient has progression on the PET scan.  Please present her at the tumor conference on 10/09.  check with radiology/pulmonary-if repeat Biopsy can be done or if any thora/para would be helpful-   Follow up on Liquid Biopsy- for now continue Osimertinib   [place]-please add her to tumor conference. Check with path - if they can run Her 2 IHC on her previous samples.   Thank you-  GB

## 2024-01-13 NOTE — ED Triage Notes (Signed)
 First nurse note: Pt to ED via [facility] from home. Pt reports right leg pain after mechanical fall. Small lac to right forearm. On blood thinners. No head trauma. Stage 4 lung cancer and reports has been feeling increased weakness with poor PO intake.   129/70 97% RA 97.7 oral  110 CBG

## 2024-01-13 NOTE — Telephone Encounter (Signed)
 Order for Her-2 neu IHC faxed to [facility] for sample [id number]. Request sent for tumor conference discussion as well.  Liquid biopsy scheduled to be drawn at next appt on 10/10.

## 2024-01-13 NOTE — ED Provider Notes (Signed)
 Stacy Cook Provider Note    Event Date/Time   First MD Initiated Contact with Patient 01/13/24 1159     (approximate)   History   Fall and Weakness   HPI  Stacy Cook is a 88 y.o. female with history of diabetes, GERD, CHF, PAD, CKD, lung cancer, is on Eliquis , presenting with right hip pain after fall.  Patient states that she was bending over, lost her balance and fell over to the right side, hit her right hip and thigh, also sustained a skin tear to her right elbow.  She denies hitting her head.  No LOC.  No chest pain or shortness of breath, no lightheadedness.  Denies any new cough or urinary symptoms.  No nausea vomiting or diarrhea.  Denies any weakness or numbness.  States that she has been feeling weak for the last several weeks, has had decreased appetite but has been ongoing for a while.  Patient lives by self at home, typically ambulates without walker or cane.  Per independent history from son, patient has been compliant with her medications, lives at home alone.  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      Physical Exam   Triage Vital Signs: ED Triage Vitals  Encounter Vitals Group     BP 01/13/24 1049 118/70     Girls Systolic BP Percentile --      Girls Diastolic BP Percentile --      Boys Systolic BP Percentile --      Boys Diastolic BP Percentile --      Pulse Rate 01/13/24 1049 (!) 105     Resp 01/13/24 1049 18     Temp 01/13/24 1049 97.8 F (36.6 C)     Temp Source 01/13/24 1049 Oral     SpO2 01/13/24 1049 96 %     Weight --      Height --      Head Circumference --      Peak Flow --      Pain Score 01/13/24 1016 4     Pain Loc --      Pain Education --      Exclude from Growth Chart --     Most recent  vital signs: Vitals:   01/13/24 1430 01/13/24 1450  BP: (!) 155/66   Pulse: 92   Resp: 17   Temp:  97.9 F (36.6 C)  SpO2: 100%      General: Awake, no distress.  CV:  Good peripheral perfusion.  Resp:  Normal effort.  No thoracic cage tenderness Abd:  No distention.  Soft and nontender Other:  No palpable skull deformities or tenderness, she has a dry scab on the top of her scalp from prior skin cancer removal.  No overlying erythema or fluctuance or induration, she has a superficial skin tear to the right elbow without bony tenderness, able to fully range her bilateral upper extremities, equal radial pulses, sensation is intact, no new weakness, she is tender to the right hip and proximal thigh with reduced range of motion, no tenderness to her knee, tib-fib, ankle, foot, no tenderness to her left lower extremity, full range of motion of her left lower extremities intact.  Equal DP pulses.   ED Results / Procedures / Treatments  Labs (all labs ordered are listed, but only abnormal results are displayed) Labs Reviewed  COMPREHENSIVE METABOLIC PANEL WITH GFR - Abnormal; Notable for the following components:      Result Value   CO2 20 (*)    BUN 32 (*)    Creatinine, Ser 1.27 (*)    Total Protein 6.0 (*)    Albumin 3.4 (*)    GFR, Estimated 41 (*)    All other components within normal limits  CBC - Abnormal; Notable for the following components:   RBC 3.81 (*)    Hemoglobin 11.6 (*)    HCT 35.4 (*)    Platelets 113 (*)    All other components within normal limits  URINALYSIS, ROUTINE W REFLEX MICROSCOPIC  TROPONIN I (HIGH SENSITIVITY)     EKG  EKG shows, V paced rhythm, rate 72, widened QRS, does not meet Sgarbossa's criteria, not significantly compared to prior   RADIOLOGY On my independent interpretation, CT head without obvious intracranial hemorrhage   PROCEDURES:  Critical Care performed: No  Procedures   MEDICATIONS ORDERED IN ED: Medications   sodium chloride  0.9 % bolus 500 mL (500 mLs Intravenous New Bag/Given 01/13/24 1332)  Tdap (ADACEL) injection 0.5 mL (0.5 mLs Intramuscular Given 01/13/24 1334)  morphine  (PF) 2 MG/ML injection 2 mg (2 mg Intravenous Given 01/13/24 1446)     IMPRESSION / MDM / ASSESSMENT AND PLAN / ED COURSE  I reviewed the triage vital signs and the nursing notes.                              Differential diagnosis includes, but is not limited to, fracture, contusion, strain, sprain, hematoma, for her weakness, patient states that has been ongoing for a while including her decreased appetite, did consider atypical ACS, electrolyte derangements, dehydration, UTI.  No shortness of breath, no hypoxia or tachypnea, no unilateral Or Tenderness or Shortness of Breath Suggest PE.  Labs, EKG, troponin, chest x-ray, UA, CT head, cervical spine, x-rays of her elbow, hip, femur.  Tdap.  IV fluid bolus.  Patient's presentation is most consistent with acute presentation with potential threat to life or bodily function.  Independent interpretation of labs and imaging below.  Patient signed out pending CT pelvis, if negative, likely needs to stay as a border.    Clinical Course as of 01/13/24 1508  Mon Jan 13, 2024  1302 Independent review of labs, troponins not elevated, electrolytes not severely deranged, creatinine is mildly elevated but this is downtrending compared to prior, LFTs are not elevated, no leukocytosis. [TT]  1336 CT Head Wo Contrast IMPRESSION: No evidence of acute intracranial abnormality or acute cervical spine fracture.   [TT]  1336 CT Cervical Spine Wo Contrast IMPRESSION: No evidence of acute intracranial abnormality or acute cervical spine fracture.   [TT]  1352 DG Chest 1 View [TT]  1352 DG Chest 1 View [TT]  1352 DG Chest 1 View IMPRESSION: 1. Tumor burden in the right hemithorax, better evaluated on PET 01/08/2024. 2. 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 3. Otherwise, no new acute findings.   [TT]  1404 DG FEMUR, MIN 2 VIEWS RIGHT Diffuse osteopenia. No acute fracture or dislocation.  [TT]  1404 DG Pelvis 1-2 Views IMPRESSION: Diffuse osteopenia. No acute fracture, pelvic bone diastasis, or dislocation   [TT]  1404 DG Elbow Complete Right Diffuse osteopenia. No acute fracture or dislocation.  [  TT]  1405 X-ray did not show any fracture, given her persistent tenderness, will proceed with CT pelvis. [TT]    Clinical Course User Index [TT] Waymond Lorelle Cummins, MD     FINAL CLINICAL IMPRESSION(S) / ED DIAGNOSES   Final diagnoses:  Weakness  Fall, initial encounter  Pain of right hip     Rx / DC Orders   ED Discharge Orders     None        Note:  This document was prepared using Dragon voice recognition software and may include unintentional dictation errors.    Waymond Lorelle Cummins, MD 01/13/24 [phone]

## 2024-01-13 NOTE — ED Notes (Signed)
 Patient c/o R hip pain. No PRN meds on MAR. MD Floy notified and asked for PRN pain med order.

## 2024-01-14 ENCOUNTER — Encounter: Payer: Self-pay | Admitting: Physician Assistant

## 2024-01-14 LAB — URINALYSIS, ROUTINE W REFLEX MICROSCOPIC
Bilirubin Urine: NEGATIVE
Glucose, UA: NEGATIVE mg/dL
Hgb urine dipstick: NEGATIVE
Ketones, ur: NEGATIVE mg/dL
Leukocytes,Ua: NEGATIVE
Nitrite: NEGATIVE
Protein, ur: NEGATIVE mg/dL
Specific Gravity, Urine: 1.03 (ref 1.005–1.030)
pH: 5 (ref 5.0–8.0)

## 2024-01-14 MED ORDER — OXYCODONE HCL 5 MG PO TABS
5.0000 mg | ORAL_TABLET | Freq: Once | ORAL | Status: AC
  Administered 2024-01-14: 5 mg via ORAL
  Filled 2024-01-14: qty 1

## 2024-01-14 MED ORDER — LIDOCAINE 5 % EX PTCH
1.0000 | MEDICATED_PATCH | CUTANEOUS | Status: DC
  Administered 2024-01-14 – 2024-01-21 (×7): 1 via TRANSDERMAL
  Filled 2024-01-14 (×8): qty 1

## 2024-01-14 NOTE — ED Notes (Signed)
 Linens changed, peri care provided. Call bell in reach

## 2024-01-14 NOTE — ED Notes (Signed)
 Pt continues to complain of pain. States she wants something more than tylenol . EDP aware.

## 2024-01-14 NOTE — Evaluation (Signed)
 Physical Therapy Evaluation Patient Details Name: Stacy Cook MRN: [id number] DOB: Apr 07, 1936 Today's Date: 01/14/2024  History of Present Illness  Loxley Cibrian is a 88 y.o. female with history of diabetes, GERD, CHF, PAD, CKD, lung cancer, is on Eliquis , presenting with right hip pain after fall.  Clinical Impression  Pt is pleasant 88 y.o. female admitted after mechanical fall resulting in R hip pain. Prior to hospitalization, pt lived alone and is able to amb without use of AD. Pt also was independent with ADLs. Pt now requires min A and verbal cuing for hand placement to roll in bed for SPT to assist with pericare. Pt requires min A for supine<>sit 2/2 hip pain for RLE management. Pt also requires min A for stand at lowest bed height and R knee block to avoid buckling. Pt able to attempt 2 stands with pt's R knee buckling during second stand and pt needing mod A for assistance back into bed. Pt demonstrates deficits in strength/ROM/activity tolerance. Pt would benefit from use of RW during her acute stay to support her balance. Would benefit from skilled PT to address above deficits and promote optimal return to PLOF.       If plan is discharge home, recommend the following: A lot of help with walking and/or transfers;A lot of help with bathing/dressing/bathroom;Assistance with cooking/housework;Assist for transportation;Help with stairs or ramp for entrance   Can travel by private vehicle   No    Equipment Recommendations None recommended by PT (pt has RW, BSC, and SPC)  Recommendations for Other Services       Functional Status Assessment Patient has had a recent decline in their functional status and demonstrates the ability to make significant improvements in function in a reasonable and predictable amount of time.     Precautions / Restrictions Precautions Precautions: Fall Recall of Precautions/Restrictions: Intact Restrictions Weight Bearing Restrictions Per Provider  Order: No      Mobility  Bed Mobility Overal bed mobility: Needs Assistance Bed Mobility: Supine to Sit, Sit to Supine, Rolling Rolling: Contact guard assist   Supine to sit: Min assist Sit to supine: Min assist   General bed mobility comments: Pt required min A to manage R LE in and out of  2/2 pain for supine<>sit. Pt able to perform bridging and use UEs to boost self up in bed. Min A for rolling in bed with verbal cues for hand placement.    Transfers Overall transfer level: Needs assistance Equipment used: 1 person hand held assist Transfers: Sit to/from Stand Sit to Stand: Min assist           General transfer comment: able to stand from lowest bed height, but with increased pain. Min A for lift from bed.    Ambulation/Gait               General Gait Details: Pt unable 2/2 pain. Pt attempted to take step/weight shift onto RLE, however buckled requiring pt to sit back into the bed. Pt fatigued and declines second attempt to ambulate using RW.  Stairs            Wheelchair Mobility     Tilt Bed    Modified Rankin (Stroke Patients Only)       Balance Overall balance assessment: Needs assistance Sitting-balance support: Feet supported, Bilateral upper extremity supported Sitting balance-Leahy Scale: Good Sitting balance - Comments: Able to maintain seated balance with UE support on the bed.   Standing balance support: Single extremity supported,  During functional activity Standing balance-Leahy Scale: Fair Standing balance comment: 1 person HHA when standing at EOB. Pt demosntrated heavy reliance on hand held support. Pt required R knee block to prevent buckling. Pt able to perform STS x 2, with R knee buckling with second stand attempt. Mod A to correct/assist patient to sit back into bed.                             Pertinent Vitals/Pain Pain Assessment Pain Assessment: Faces Faces Pain Scale: Hurts even more Pain Location: R  anterior thigh/hip Pain Descriptors / Indicators: Discomfort, Grimacing, Shooting, Sharp Pain Intervention(s): Limited activity within patient's tolerance, Monitored during session    Home Living Family/patient expects to be discharged to:: Private residence Living Arrangements: Alone Available Help at Discharge: Family;Available PRN/intermittently Type of Home: House Home Access: Stairs to enter Entrance Stairs-Rails: Right Entrance Stairs-Number of Steps: 5   Home Layout: One level Home Equipment: Agricultural consultant (2 wheels);Cane - single point;BSC/3in1      Prior Function Prior Level of Function : Independent/Modified Independent             Mobility Comments: Independent with mobility without use of AD. ADLs Comments: Independent with ADLs     Extremity/Trunk Assessment   Upper Extremity Assessment Upper Extremity Assessment: Generalized weakness    Lower Extremity Assessment Lower Extremity Assessment: Generalized weakness    Cervical / Trunk Assessment Cervical / Trunk Assessment: Normal  Communication   Communication Communication: No apparent difficulties    Cognition Arousal: Alert Behavior During Therapy: WFL for tasks assessed/performed   PT - Cognitive impairments: No apparent impairments                       PT - Cognition Comments: Pt is A&Ox4. Pleasant and agreeable to PT session Following commands: Intact       Cueing Cueing Techniques: Verbal cues, Visual cues, Tactile cues     General Comments      Exercises Other Exercises Other Exercises: Edu on role of PT and benefit of further skilled physical therapy during her acute stay. Other Exercises: Max assistance with peri care   Assessment/Plan    PT Assessment Patient needs continued PT services  PT Problem List Decreased strength;Decreased range of motion;Decreased activity tolerance;Decreased balance;Decreased mobility;Decreased knowledge of use of DME       PT  Treatment Interventions DME instruction;Gait training;Stair training;Functional mobility training;Therapeutic activities;Therapeutic exercise;Balance training;Neuromuscular re-education;Patient/family education    PT Goals (Current goals can be found in the Care Plan section)  Acute Rehab PT Goals Patient Stated Goal: to move better and get stronger PT Goal Formulation: With patient Time For Goal Achievement: 01/28/24 Potential to Achieve Goals: Good    Frequency Min 2X/week     Co-evaluation               AM-PAC PT 6 Clicks Mobility  Outcome Measure Help needed turning from your back to your side while in a flat bed without using bedrails?: A Little Help needed moving from lying on your back to sitting on the side of a flat bed without using bedrails?: A Little Help needed moving to and from a bed to a chair (including a wheelchair)?: A Lot Help needed standing up from a chair using your arms (e.g., wheelchair or bedside chair)?: A Lot Help needed to walk in hospital room?: A Lot Help needed climbing 3-5 steps with a railing? :  A Lot 6 Click Score: 14    End of Session Equipment Utilized During Treatment: Gait belt Activity Tolerance: Patient tolerated treatment well;Patient limited by fatigue Patient left: in bed;with call bell/phone within reach;with bed alarm set Nurse Communication: Mobility status PT Visit Diagnosis: Unsteadiness on feet (R26.81);Muscle weakness (generalized) (M62.81);History of falling (Z91.81);Difficulty in walking, not elsewhere classified (R26.2)    Time: 0937-1000 PT Time Calculation (min) (ACUTE ONLY): 23 min   Charges:                 Ahmiyah Coil, SPT   Makinsey Pepitone 01/14/2024, 12:04 PM

## 2024-01-14 NOTE — ED Provider Notes (Addendum)
 Emergency Medicine Observation Re-evaluation Note  Stacy Cook is a 88 y.o. female, seen on rounds today.  Pt initially presented to the ED for complaints of Fall and Weakness  Currently, the patient is resting in bed. No reported issues from nursing team.   Physical Exam  BP (!) 144/57   Pulse 79   Temp 98.1 F (36.7 C)   Resp 16   SpO2 97%  Physical Exam General: Resting in bed  ED Course / MDM   Extensive medical workup performed yesterday with labs with critical derangements, imaging without acute abnormalities.  Given degree of weakness, patient placed in boarder status. PT and TOC consulted.   Plan  Current plan is for dispo per social work.    Levander Slate, MD 01/14/24 1549  Notified by RN of continued hip pain.  Ordered for additional pain medication.  Will also order MRI of right hip to be completed in the morning due to pacemaker status to further ensure no missed traumatic injury.   Levander Slate, MD 01/14/24 2033

## 2024-01-14 NOTE — ED Notes (Signed)
 Patient is resting comfortably.

## 2024-01-14 NOTE — ED Notes (Signed)
Pt given ice water as requested °

## 2024-01-14 NOTE — ED Notes (Signed)
 PT has pacemaker. Will need radiologist to consult in daytime. Pt updated.

## 2024-01-14 NOTE — ED Notes (Signed)
 PT reports some improvement with pain meds but not much- does seem to have bit of increased mobility after narcotic.

## 2024-01-15 ENCOUNTER — Emergency Department

## 2024-01-15 DIAGNOSIS — M87051 Idiopathic aseptic necrosis of right femur: Secondary | ICD-10-CM | POA: Diagnosis not present

## 2024-01-15 DIAGNOSIS — S76011A Strain of muscle, fascia and tendon of right hip, initial encounter: Secondary | ICD-10-CM | POA: Diagnosis not present

## 2024-01-15 DIAGNOSIS — M160 Bilateral primary osteoarthritis of hip: Secondary | ICD-10-CM | POA: Diagnosis not present

## 2024-01-15 MED ORDER — OXYCODONE-ACETAMINOPHEN 5-325 MG PO TABS
1.0000 | ORAL_TABLET | Freq: Three times a day (TID) | ORAL | Status: DC | PRN
  Administered 2024-01-15 – 2024-01-23 (×7): 1 via ORAL
  Filled 2024-01-15 (×7): qty 1

## 2024-01-15 NOTE — ED Provider Notes (Addendum)
 Patient requesting additional pain medication.  Pain was well treated with oxycodone .  She is having pain in her right thigh with movement, certainly understandable based on her MRI findings.  Will write for oxycodone  every 8 hours.  Had good effect for her yesterday.  Dr. Nicholaus planning to discuss MRi with orthopedics today   Dicky Anes, MD 01/15/24 1525    Dicky Anes, MD 01/15/24 [phone]

## 2024-01-15 NOTE — ED Notes (Signed)
 Pt eating breakfast and talking on cell phone at this time.

## 2024-01-15 NOTE — Progress Notes (Signed)
  Device system confirmed to be MRI conditional, with implant date > 6 weeks ago, and no evidence of abandoned or epicardial leads in review of most recent CXR Device last cleared by EP Provider:   Tatiana Passey PA)  Clearance is good through for 1 year as long as parameters remain stable at time of check. If pt undergoes a cardiac device procedure during that time, they should be re-cleared.   Tachy-therapies to be programmed off if applicable with device back to pre-MRI settings after completion of exam.  [employer] - Programming recommendation received through [employer] App/Tablet  Corey Laski S, RT  01/15/2024 7:04 AM

## 2024-01-15 NOTE — ED Provider Notes (Signed)
 Emergency Medicine Observation Re-evaluation Note   BP (!) 144/57   Pulse 79   Temp 98.1 F (36.7 C)   Resp 16   SpO2 97%    ED Course / MDM   No reported events during my shift at the time of this note.   Pt is awaiting dispo from consultants   Ginnie Shams MD    Shams Ginnie, MD 01/15/24 [phone]

## 2024-01-15 NOTE — ED Notes (Signed)
 PT continues to experience extreme pain upon any movement of that R hip/thigh. Discussed with EDP. Pain meds scheduled.

## 2024-01-15 NOTE — ED Notes (Signed)
 Pt sleeping.

## 2024-01-15 NOTE — TOC Initial Note (Signed)
 Transition of Care [facility]) - Initial/Assessment Note    Patient Details  Name: Stacy Cook MRN: [id number] Date of Birth: 1935-12-28  [facility]) CM/SW Contact:    Delphine KANDICE Bring, RN Phone Number: 01/15/2024, 5:46 PM  Clinical Narrative:                 Cm spoke with patient in ED. No family at bedside. Patient states that she lives alone but have two sons in [place]. Patient states she was independent prior to her fall. Cm informed her of therapy recommendation. For SNF. Patient is in agreement placement. She would like a facility in the [place] area.   Referrals sent, FL2, PASRR complete       Patient Goals and CMS Choice            Expected Discharge Plan and Services                                              Prior Living Arrangements/Services                       Activities of Daily Living      Permission Sought/Granted                  Emotional Assessment              Admission diagnosis:  Fall Patient Active Problem List   Diagnosis Date Noted   CKD (chronic kidney disease) stage 4, GFR 15-29 ml/min (HCC) 01/31/2022   Pleural effusion on right 12/21/2021   Mild protein-calorie malnutrition 10/02/2019   Senile purpura 10/02/2019   Myalgia 03/20/2019   Pacemaker 03/19/2019   Gastroesophageal reflux disease 01/22/2019   Anemia of chronic disease 01/22/2019   Right hemiparesis (HCC) 01/22/2019   Neutropenia 01/22/2019   Atherosclerotic heart disease of native coronary artery without angina pectoris 01/22/2019   Hemiplegia affecting dominant side, post-stroke (HCC) 01/02/2019   Acute on chronic anemia    Chronic diastolic congestive heart failure (HCC)    Widened pulse pressure    Muscle spasm of right lower extremity    Heart block 12/10/2018   Seizure (HCC) 12/09/2018   Cardiac asystole (HCC) 12/09/2018   Anxiety state    Acute renal failure    Embolic stroke (HCC) 12/05/2018   History of  aortic valve replacement 12/02/2018   Acute on chronic diastolic heart failure (HCC) 12/02/2018   Anemia    Essential hypertension 02/14/2018   Renal stone 12/23/2017   Acquired iron  deficiency anemia due to decreased absorption 09/09/2017   Hypertension associated with stage 3 chronic kidney disease due to type 2 diabetes mellitus (HCC) 09/09/2017   History of cancer of stomach 04/11/2016   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   Carotid stenosis 02/07/2016   Primary malignant neoplasm of right lower lobe of lung (HCC) 12/02/2015   PAD (peripheral artery disease) 09/27/2015   Peripheral vascular disorder due to diabetes mellitus (HCC) 09/27/2015   History of malignant neoplasm of thoracic cavity structure 06/06/2015   History of lung cancer 06/06/2015   B12 deficiency 03/24/2015   Recurrent coronary arteriosclerosis after percutaneous transluminal coronary angioplasty 03/24/2015   Neuroendocrine tumor (HCC) 03/24/2015   Diabetes (HCC) 03/24/2015   Thrombocytopenia 11/10/2014   Neuritis or radiculitis due to rupture of lumbar intervertebral disc 09/10/2014  Nerve root inflammation 03/11/2014   Nonrheumatic aortic (valve) stenosis 09/04/2013   History of aortic valve stenosis 09/04/2013   Hyperlipidemia 08/24/2013   PCP:  [name], MD Pharmacy:   [facility] [address] [place] - [address] [place] [address] Phone: [phone] Fax: [phone]  [facility] [address] [place] [address] Phone: [phone] Fax: [phone]     Social Drivers of Health (SDOH) Social History: SDOH Screenings   Food Insecurity: No Food Insecurity (11/25/2023)   Received from [facility]  Housing: Low Risk  (12/16/2023)   Received from [facility]  Transportation Needs: No Transportation Needs (11/25/2023)   Received from [facility]   Utilities: Not At Risk (11/25/2023)   Received from [facility]  Depression [id number]): Low Risk  (02/02/2019)  Financial Resource Strain: Low Risk  (11/25/2023)   Received from [facility]  Tobacco Use: Low Risk  (01/13/2024)   SDOH Interventions:     Readmission Risk Interventions     No data to display

## 2024-01-15 NOTE — [place] FL2 (Signed)
 [place]  MEDICAID FL2 LEVEL OF CARE FORM     IDENTIFICATION  Patient Name: Stacy Cook Birthdate: June 12, 1935 Sex: female Admission Date (Current Location): 01/13/2024  [facility] and [place] Number:  Chiropodist and Address:  [facility], [address] [place], [place] [address]      Provider Number: [id number]  Attending Physician Name and Address:  Nicholaus Rolland BRAVO, MD  Relative Name and Phone Number:  Jaxon, Mynhier (Son)  [phone] [facility])    Current Level of Care: Hospital Recommended Level of Care: Skilled Nursing Facility Prior Approval Number:    Date Approved/Denied:   PASRR Number: [id number]  Discharge Plan: SNF    Current Diagnoses: Patient Active Problem List   Diagnosis Date Noted   CKD (chronic kidney disease) stage 4, GFR 15-29 ml/min (HCC) 01/31/2022   Pleural effusion on right 12/21/2021   Mild protein-calorie malnutrition 10/02/2019   Senile purpura 10/02/2019   Myalgia 03/20/2019   Pacemaker 03/19/2019   Gastroesophageal reflux disease 01/22/2019   Anemia of chronic disease 01/22/2019   Right hemiparesis (HCC) 01/22/2019   Neutropenia 01/22/2019   Atherosclerotic heart disease of native coronary artery without angina pectoris 01/22/2019   Hemiplegia affecting dominant side, post-stroke (HCC) 01/02/2019   Acute on chronic anemia    Chronic diastolic congestive heart failure (HCC)    Widened pulse pressure    Muscle spasm of right lower extremity    Heart block 12/10/2018   Seizure (HCC) 12/09/2018   Cardiac asystole (HCC) 12/09/2018   Anxiety state    Acute renal failure    Embolic stroke (HCC) 12/05/2018   History of aortic valve replacement 12/02/2018   Acute on chronic diastolic heart failure (HCC) 12/02/2018   Anemia    Essential hypertension 02/14/2018   Renal stone 12/23/2017   Acquired iron  deficiency anemia due to decreased absorption 09/09/2017   Hypertension associated with stage 3  chronic kidney disease due to type 2 diabetes mellitus (HCC) 09/09/2017   History of cancer of stomach 04/11/2016   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   Carotid stenosis 02/07/2016   Primary malignant neoplasm of right lower lobe of lung (HCC) 12/02/2015   PAD (peripheral artery disease) 09/27/2015   Peripheral vascular disorder due to diabetes mellitus (HCC) 09/27/2015   History of malignant neoplasm of thoracic cavity structure 06/06/2015   History of lung cancer 06/06/2015   B12 deficiency 03/24/2015   Recurrent coronary arteriosclerosis after percutaneous transluminal coronary angioplasty 03/24/2015   Neuroendocrine tumor (HCC) 03/24/2015   Diabetes (HCC) 03/24/2015   Thrombocytopenia 11/10/2014   Neuritis or radiculitis due to rupture of lumbar intervertebral disc 09/10/2014   Nerve root inflammation 03/11/2014   Nonrheumatic aortic (valve) stenosis 09/04/2013   History of aortic valve stenosis 09/04/2013   Hyperlipidemia 08/24/2013    Orientation RESPIRATION BLADDER Height & Weight     Self, Time, Situation, Place  Normal Continent Weight:   Height:     BEHAVIORAL SYMPTOMS/MOOD NEUROLOGICAL BOWEL NUTRITION STATUS      Continent Diet  AMBULATORY STATUS COMMUNICATION OF NEEDS Skin   Limited Assist Verbally Normal                       Personal Care Assistance Level of Assistance  Bathing, Feeding, Dressing Bathing Assistance: Limited assistance Feeding assistance: Independent Dressing Assistance: Limited assistance     Functional Limitations Info  Sight, Hearing, Speech Sight Info: Adequate Hearing Info: Adequate Speech Info: Adequate    SPECIAL CARE  FACTORS FREQUENCY  PT (By licensed PT), OT (By licensed OT)     PT Frequency: 5x/week OT Frequency: 5x/wk            Contractures Contractures Info: Not present    Additional Factors Info  Code Status, Allergies Code Status Info: Full Code Allergies Info: Mirtazapine Low Intolerance  Other (See Comments) Sluggish  Pneumococcal Vaccine Low  Itching, Other (See Comments) Hives and fever  Statins Low Intolerance Other (See Comments) Joint pain  Sulfa Antibiotics Low  Nausea And Vomiting           Current Medications (01/15/2024):  This is the current hospital active medication list Current Facility-Administered Medications  Medication Dose Route Frequency Provider Last Rate Last Admin   acetaminophen  (TYLENOL ) tablet 650 mg  650 mg Oral Q6H PRN Goodman, Graydon, MD   650 mg at 01/13/24 1743   apixaban  (ELIQUIS ) tablet 2.5 mg  2.5 mg Oral BID Goodman, Graydon, MD   2.5 mg at 01/15/24 0850   cholecalciferol  (VITAMIN D3) 25 MCG (1000 UNIT) tablet 1,000 Units  1,000 Units Oral Daily Floy Roberts, MD   1,000 Units at 01/15/24 0850   cyanocobalamin  (VITAMIN B12) tablet 1,000 mcg  1,000 mcg Oral Daily Goodman, Graydon, MD   1,000 mcg at 01/15/24 9149   eltrombopag  (PROMACTA ) tablet 25 mg  25 mg Oral Daily Goodman, Graydon, MD       ezetimibe  (ZETIA ) tablet 10 mg  10 mg Oral Daily Goodman, Graydon, MD   10 mg at 01/15/24 9148   ferrous sulfate  tablet 325 mg  325 mg Oral Q breakfast Goodman, Graydon, MD   325 mg at 01/15/24 9149   isosorbide  mononitrate (IMDUR ) 24 hr tablet 30 mg  30 mg Oral Daily Goodman, Graydon, MD   30 mg at 01/15/24 0850   lidocaine  (LIDODERM ) 5 % 1 patch  1 patch Transdermal Q24H Levander Slate, MD   1 patch at 01/14/24 2144   magnesium  oxide (MAG-OX) tablet 200 mg  200 mg Oral Daily Goodman, Graydon, MD   200 mg at 01/15/24 9149   metoprolol  succinate (TOPROL -XL) 24 hr tablet 50 mg  50 mg Oral QPM Goodman, Graydon, MD   50 mg at 01/14/24 1716   oxyCODONE -acetaminophen  (PERCOCET/ROXICET) 5-325 MG per tablet 1 tablet  1 tablet Oral Q8H PRN Dicky Anes, MD   1 tablet at 01/15/24 1534   Current Outpatient Medications  Medication Sig Dispense Refill   apixaban  (ELIQUIS ) 2.5 MG TABS tablet Take 1 tablet (2.5 mg total) by mouth 2 (two) times daily. 180 tablet 1    cholecalciferol  (VITAMIN D ) 1000 UNITS tablet Take 1,000 Units by mouth daily.     cyanocobalamin  1000 MCG tablet Take 1,000 mcg by mouth daily.      eltrombopag  (PROMACTA ) 25 MG tablet Take 1 tablet (25 mg total) by mouth daily. Take on an empty stomach, 1 hour before a meal or 2 hours after. 30 tablet 2   ezetimibe  (ZETIA ) 10 MG tablet Take 1 tablet (10 mg total) by mouth daily. 90 tablet 3   ferrous sulfate  325 (65 FE) MG tablet Take 325 mg by mouth daily with breakfast.     furosemide  (LASIX ) 20 MG tablet Take 20 mg by mouth daily as needed for edema.     ipratropium (ATROVENT) 0.06 % nasal spray Place 2 sprays into both nostrils 2 (two) times daily.     isosorbide  mononitrate (IMDUR ) 30 MG 24 hr tablet Take 1 tablet (30 mg total) by mouth  daily. 90 tablet 3   Magnesium  100 MG TABS Take 1 tablet by mouth daily at 6 (six) AM.     metoprolol  succinate (TOPROL -XL) 50 MG 24 hr tablet Take 1 tablet (50 mg total) by mouth every evening. Take with or immediately following a meal. 30 tablet 0   osimertinib  mesylate (TAGRISSO ) 80 MG tablet Take 1 tablet (80 mg total) by mouth daily. 30 tablet 1   potassium citrate  (UROCIT-K ) 10 MEQ (1080 MG) SR tablet Take 1 tablet by mouth once daily 90 tablet 0     Discharge Medications: Please see discharge summary for a list of discharge medications.  Relevant Imaging Results:  Relevant Lab Results:   Additional Information SSN [id number]  Delphine KANDICE Bring, RN

## 2024-01-15 NOTE — ED Provider Notes (Addendum)
 Emergency Medicine Observation Re-evaluation Note   Clinical Course as of 01/15/24 1647  Wed Jan 15, 2024  8452 Ortho consulted with Dr. Onesimo. Awaiting call back.  [HD]  1601 Per Dr. Eusebio to do acutely.  She can be WBAT.  I can see in clinic to discuss the AVN, which has been there for a while [HD]  1646 Patient updated regarding plan of care [HD]    Clinical Course User Index [HD] Nicholaus Rolland BRAVO, MD [TT] Waymond Lorelle Cummins, MD     Stacy Cook is a 88 y.o. female, seen on rounds today.  Pt initially presented to the ED for complaints of Fall and Weakness Currently, the patient is laying in bed resting comfortably  Physical Exam  BP 126/78   Pulse 80   Temp 98 F (36.7 C) (Oral)   Resp 16   SpO2 96%  Physical Exam General: No acute distress Cardiac: Warm and well-perfused Lungs: No respiratory distress   ED Course / MDM  EKG:EKG Interpretation Date/Time:  Monday January 13 2024 11:02:57 EDT Ventricular Rate:  72 PR Interval:    QRS Duration:  156 QT Interval:  486 QTC Calculation: 532 R Axis:   -84  Text Interpretation: Ventricular-paced rhythm Abnormal ECG When compared with ECG of 12-Nov-2023 09:50, Premature ventricular complexes are no longer Present Vent. rate has decreased BY  24 BPM Confirmed by UNCONFIRMED, DOCTOR ([id number]), editor Alex Slough [phone]) on 01/13/2024 2:05:41 PM  I have reviewed the labs performed to date as well as medications administered while in observation.  Recent changes in the last 24 hours include: Started on scheduled oxycodone  for right thigh gluteus medius tear; will consult ortho for recommendations.   Plan  Current plan is for [facility]    Nicholaus Rolland BRAVO, MD 01/15/24 1547    Nicholaus Rolland BRAVO, MD 01/15/24 [phone]

## 2024-01-15 NOTE — ED Notes (Signed)
 Pt in MRI.

## 2024-01-16 ENCOUNTER — Inpatient Hospital Stay: Attending: Internal Medicine

## 2024-01-16 NOTE — Progress Notes (Signed)
 Physical Therapy Treatment Patient Details Name: Stacy Cook MRN: [id number] DOB: Jun 18, 1935 Today's Date: 01/16/2024   History of Present Illness Stacy Cook is a 88 y.o. female with history of diabetes, GERD, CHF, PAD, CKD, lung cancer, is on Eliquis , presenting with right hip pain after fall.  MRI significant for high-grade, partial-thickness tear to R glut med insertion, low-grade, partial-thickness tear to R glut min insertion, R adductor strain and R hip AVN; cleared per ortho for WBAT    PT Comments  Patient resting in bed upon arrival to session, rates R hip pain 6-7/10; meds requested/administered per RN during session.  Generally fearful/anxious about mobility (and anticipated pain), but responds well to cuing and encouragement from therapist. Tolerating improved mobility efforts this date, completing sit/stand, bed/chair transfer and short-distance gait (5') with RW, min assist +2 for safety.  Demonstrates 3-point, step to gait pattern with step-by-step cuing from therapist for technique. Fair tolerance for WBing R LE, less episodes of buckling with gait and WBing efforts; minimal increase in pain with mobility efforts today.  Patient encouraged by performance and hopeful for continued progression in subsequent sessions.    If plan is discharge home, recommend the following: A little help with walking and/or transfers;A little help with bathing/dressing/bathroom   Can travel by private vehicle     Yes  Equipment Recommendations       Recommendations for Other Services       Precautions / Restrictions Precautions Precautions: Fall Recall of Precautions/Restrictions: Intact Restrictions Weight Bearing Restrictions Per Provider Order: Yes RLE Weight Bearing Per Provider Order: Weight bearing as tolerated     Mobility  Bed Mobility Overal bed mobility: Needs Assistance Bed Mobility: Supine to Sit Rolling: Min assist   Supine to sit: Mod assist           Transfers Overall transfer level: Needs assistance Equipment used: Rolling walker (2 wheels) Transfers: Sit to/from Stand, Bed to chair/wheelchair/BSC Sit to Stand: Min assist, +2 safety/equipment Stand pivot transfers: Min assist, +2 safety/equipment              Ambulation/Gait Ambulation/Gait assistance: Min assist Gait Distance (Feet): 5 Feet (forward/backward) Assistive device: Rolling walker (2 wheels)         General Gait Details: 3-point, step to gait pattern with step-by-step cuing from therapist for technique.  Fair tolerance for WBing R LE, less episodes of buckling with gait and WBing efforts; minimal increase in pain wiht mobility efforts today   Stairs             Wheelchair Mobility     Tilt Bed    Modified Rankin (Stroke Patients Only)       Balance Overall balance assessment: Needs assistance Sitting-balance support: Feet supported, No upper extremity supported Sitting balance-Leahy Scale: Good     Standing balance support: Bilateral upper extremity supported Standing balance-Leahy Scale: Fair                              Hotel manager: No apparent difficulties  Cognition Arousal: Alert Behavior During Therapy: WFL for tasks assessed/performed   PT - Cognitive impairments: No apparent impairments                       PT - Cognition Comments: Generally fearful/anxious of movement, but responds well to cuing/encouragement Following commands: Intact      Cueing Cueing Techniques: Verbal cues, Visual cues, Tactile  cues  Exercises      General Comments        Pertinent Vitals/Pain Pain Assessment Pain Assessment: 0-10 Pain Score: 7  Pain Intervention(s): Limited activity within patient's tolerance, Monitored during session, Repositioned, Patient requesting pain meds-RN notified, RN gave pain meds during session    Home Living                          Prior  Function            PT Goals (current goals can now be found in the care plan section) Acute Rehab PT Goals Patient Stated Goal: to move better and get stronger PT Goal Formulation: With patient Time For Goal Achievement: 01/28/24 Potential to Achieve Goals: Good Progress towards PT goals: Progressing toward goals    Frequency    Min 2X/week      PT Plan      Co-evaluation              AM-PAC PT 6 Clicks Mobility   Outcome Measure  Help needed turning from your back to your side while in a flat bed without using bedrails?: A Little Help needed moving from lying on your back to sitting on the side of a flat bed without using bedrails?: A Lot Help needed moving to and from a bed to a chair (including a wheelchair)?: A Little Help needed standing up from a chair using your arms (e.g., wheelchair or bedside chair)?: A Little Help needed to walk in hospital room?: A Little Help needed climbing 3-5 steps with a railing? : A Lot 6 Click Score: 16    End of Session Equipment Utilized During Treatment: Gait belt Activity Tolerance: Patient tolerated treatment well Patient left: in chair;with call bell/phone within reach;with chair alarm set Nurse Communication: Mobility status PT Visit Diagnosis: Unsteadiness on feet (R26.81);Muscle weakness (generalized) (M62.81);History of falling (Z91.81);Difficulty in walking, not elsewhere classified (R26.2)     Time: 8479-8451 PT Time Calculation (min) (ACUTE ONLY): 28 min  Charges:    $Gait Training: 8-22 mins $Therapeutic Activity: 8-22 mins PT General Charges $$ ACUTE PT VISIT: 1 Visit                    Alexios Keown H. Delores, PT, DPT, NCS 01/16/24, 4:13 PM [phone]

## 2024-01-16 NOTE — ED Provider Notes (Signed)
 Emergency Medicine Observation Re-evaluation Note  Stacy Cook is a 88 y.o. female, awaiting social work placement  Physical Exam  BP 136/63 (BP Location: Right Arm)   Pulse 79   Temp 98.3 F (36.8 C) (Oral)   Resp 18   SpO2 98%  Physical Exam General: resting comfortably   ED Course / MDM  Ortho had been consulted for MRI findings. Can follow up as outpatient per previous note.  Plan  Current plan is for placement.    Floy Roberts, MD 01/16/24 [phone]

## 2024-01-16 NOTE — ED Notes (Signed)
 Fall risk bundle is currently in place.

## 2024-01-16 NOTE — TOC Progression Note (Signed)
 Transition of Care [facility]) - Progression Note    Patient Details  Name: Lottie Sigman MRN: [id number] Date of Birth: May 19, 1935  [facility] CM/SW Contact  Lauraine JAYSON Carpen, LCSW Phone Number: 01/16/2024, 9:30 AM  Clinical Narrative:   No SNF bed offers this morning. Will provide to patient once available.  Expected Discharge Plan and Services                                               Social Drivers of Health (SDOH) Interventions SDOH Screenings   Food Insecurity: No Food Insecurity (11/25/2023)   Received from [facility]  Housing: Low Risk  (12/16/2023)   Received from [facility]  Transportation Needs: No Transportation Needs (11/25/2023)   Received from [facility]  Utilities: Not At Risk (11/25/2023)   Received from [facility]  Depression [phone]): Low Risk  (02/02/2019)  Financial Resource Strain: Low Risk  (11/25/2023)   Received from [facility]  Tobacco Use: Low Risk  (01/13/2024)    Readmission Risk Interventions     No data to display

## 2024-01-17 ENCOUNTER — Other Ambulatory Visit

## 2024-01-17 ENCOUNTER — Inpatient Hospital Stay: Admitting: Nurse Practitioner

## 2024-01-17 ENCOUNTER — Inpatient Hospital Stay

## 2024-01-17 ENCOUNTER — Encounter: Payer: Self-pay | Admitting: Nurse Practitioner

## 2024-01-17 ENCOUNTER — Ambulatory Visit: Admitting: Internal Medicine

## 2024-01-17 LAB — CBC WITH DIFFERENTIAL/PLATELET
Abs Immature Granulocytes: 0.02 K/uL (ref 0.00–0.07)
Basophils Absolute: 0 K/uL (ref 0.0–0.1)
Basophils Relative: 0 %
Eosinophils Absolute: 0.1 K/uL (ref 0.0–0.5)
Eosinophils Relative: 1 %
HCT: 31.1 % — ABNORMAL LOW (ref 36.0–46.0)
Hemoglobin: 10.3 g/dL — ABNORMAL LOW (ref 12.0–15.0)
Immature Granulocytes: 0 %
Lymphocytes Relative: 20 %
Lymphs Abs: 0.9 K/uL (ref 0.7–4.0)
MCH: 30.7 pg (ref 26.0–34.0)
MCHC: 33.1 g/dL (ref 30.0–36.0)
MCV: 92.8 fL (ref 80.0–100.0)
Monocytes Absolute: 0.3 K/uL (ref 0.1–1.0)
Monocytes Relative: 6 %
Neutro Abs: 3.3 K/uL (ref 1.7–7.7)
Neutrophils Relative %: 73 %
Platelets: 104 K/uL — ABNORMAL LOW (ref 150–400)
RBC: 3.35 MIL/uL — ABNORMAL LOW (ref 3.87–5.11)
RDW: 13.6 % (ref 11.5–15.5)
WBC: 4.6 K/uL (ref 4.0–10.5)
nRBC: 0 % (ref 0.0–0.2)

## 2024-01-17 LAB — BASIC METABOLIC PANEL WITH GFR
Anion gap: 9 (ref 5–15)
BUN: 42 mg/dL — ABNORMAL HIGH (ref 8–23)
CO2: 26 mmol/L (ref 22–32)
Calcium: 8.6 mg/dL — ABNORMAL LOW (ref 8.9–10.3)
Chloride: 102 mmol/L (ref 98–111)
Creatinine, Ser: 1.31 mg/dL — ABNORMAL HIGH (ref 0.44–1.00)
GFR, Estimated: 40 mL/min — ABNORMAL LOW (ref >60)
Glucose, Bld: 83 mg/dL (ref 70–99)
Potassium: 4.9 mmol/L (ref 3.5–5.1)
Sodium: 137 mmol/L (ref 135–145)

## 2024-01-17 LAB — CK: Total CK: 20 U/L — ABNORMAL LOW (ref 38–234)

## 2024-01-17 MED ORDER — BOOST / RESOURCE BREEZE PO LIQD CUSTOM
1.0000 | Freq: Three times a day (TID) | ORAL | Status: DC
Start: 2024-01-17 — End: 2024-01-20
  Administered 2024-01-17 – 2024-01-20 (×9): 1 via ORAL

## 2024-01-17 MED ORDER — ALUM & MAG HYDROXIDE-SIMETH 200-200-20 MG/5ML PO SUSP
30.0000 mL | Freq: Once | ORAL | Status: AC
  Administered 2024-01-17: 30 mL via ORAL
  Filled 2024-01-17: qty 30

## 2024-01-17 MED ORDER — LIDOCAINE VISCOUS HCL 2 % MT SOLN
15.0000 mL | Freq: Once | OROMUCOSAL | Status: AC
  Administered 2024-01-17: 15 mL via OROMUCOSAL
  Filled 2024-01-17: qty 15

## 2024-01-17 NOTE — ED Provider Notes (Signed)
 Notified by RN that patient was reporting difficulty swallowing.  Went to evaluate patient at bedside, reports that she has pain when she takes sips of water and feels like it is going to get stuck.  I did observe her take several drinks of water and she did not have any spitting up or vomiting with this.  Do not suspect esophageal obstruction.  Suspect possible irritation from medications.  Will order for GI cocktail.  RN did also note that patient had very dark-colored urine, do note that she has been boarding in our ER for several days with poor mobility.  Repeat BMP and obtain CK as well as urinalysis.  Repeat BMP and CK reassuring.  Continue plans for social work placement.   Levander Slate, MD 01/17/24 [phone]

## 2024-01-17 NOTE — Progress Notes (Signed)
 Continued discomfort R buttocks and groin area, able to tolerate standing and shuffling side steps to bedside chair, no knee buckling. Awaiting STR placement.    01/17/24 1600  PT Visit Information  Assistance Needed +1  History of Present Illness Stacy Cook is a 88 y.o. female with history of diabetes, GERD, CHF, PAD, CKD, lung cancer, is on Eliquis , presenting with right hip pain after fall.  MRI significant for high-grade, partial-thickness tear to R glut med insertion, low-grade, partial-thickness tear to R glut min insertion, R adductor strain and R hip AVN; cleared per ortho for WBAT  Subjective Data  Patient Stated Goal to move better and get stronger  Precautions  Precautions Fall  Recall of Precautions/Restrictions Intact  Restrictions  Weight Bearing Restrictions Per Provider Order Yes  RLE Weight Bearing Per Provider Order WBAT  Other Position/Activity Restrictions  (R glut tear)  Pain Assessment  Pain Assessment Faces  Faces Pain Scale 4  Pain Location R anterior thigh/hip  Pain Descriptors / Indicators Discomfort;Grimacing;Shooting;Sharp  Pain Intervention(s) Monitored during session  Cognition  Arousal Alert  Behavior During Therapy WFL for tasks assessed/performed  PT - Cognitive impairments No apparent impairments  PT - Cognition Comments Generally fearful/anxious of movement, but responds well to cuing/encouragement  Following Commands  Following commands Intact  Cueing  Cueing Techniques Verbal cues;Visual cues;Tactile cues  Communication  Communication No apparent difficulties  Bed Mobility  Overal bed mobility Needs Assistance  Bed Mobility Supine to Sit  Rolling Min assist  Supine to sit Mod assist  Transfers  Overall transfer level Needs assistance  Equipment used Rolling walker (2 wheels)  Transfers Sit to/from Stand;Bed to chair/wheelchair/BSC  Sit to Stand Mod assist  Bed to/from chair/wheelchair/BSC transfer type: Step pivot  Stand pivot  transfers Min assist;Mod assist  General transfer comment able to stand from lowest bed height, but with increased pain. Min A for lift from bed.  Ambulation/Gait  Ambulation/Gait assistance Min assist  Gait Distance (Feet)  (5)  Assistive device Rolling walker (2 wheels)  Gait Pattern/deviations Step-to pattern;Decreased step length - right;Decreased weight shift to right  General Gait Details 3-point, step to gait pattern with step-by-step cuing from therapist for technique.  Fair tolerance for WBing R LE, no episodes of buckling  Gait velocity  (dec)  Balance  Overall balance assessment Needs assistance  Sitting-balance support Feet supported;No upper extremity supported  Sitting balance-Leahy Scale Good  Standing balance support Bilateral upper extremity supported  Standing balance-Leahy Scale Fair  Exercises  Exercises General Lower Extremity;Other exercises  General Exercises - Lower Extremity  Ankle Circles/Pumps AROM;Both;10 reps  Long Arc Quad AROM;Both;10 reps  Other Exercises  Other Exercises Edu on role of PT and benefit of further skilled physical therapy during her acute stay.  PT - End of Session  Equipment Utilized During Treatment Gait belt  Activity Tolerance Patient tolerated treatment well  Patient left in chair;with call bell/phone within reach;with chair alarm set  Nurse Communication Mobility status   PT - Assessment/Plan  PT Visit Diagnosis Unsteadiness on feet (R26.81);Muscle weakness (generalized) (M62.81);History of falling (Z91.81);Difficulty in walking, not elsewhere classified (R26.2)  PT Frequency (ACUTE ONLY) Min 2X/week  Follow Up Recommendations Skilled nursing-short term rehab (<3 hours/day)  Can patient physically be transported by private vehicle Yes  Patient can return home with the following A little help with walking and/or transfers;A little help with bathing/dressing/bathroom  PT equipment None recommended by PT (Pt has a RW, BSC, SPC)   AM-PAC PT  6 Clicks Mobility Outcome Measure (Version 2)  Help needed turning from your back to your side while in a flat bed without using bedrails? 3  Help needed moving from lying on your back to sitting on the side of a flat bed without using bedrails? 2  Help needed moving to and from a bed to a chair (including a wheelchair)? 3  Help needed standing up from a chair using your arms (e.g., wheelchair or bedside chair)? 3  Help needed to walk in hospital room? 3  Help needed climbing 3-5 steps with a railing?  2  6 Click Score 16  Consider Recommendation of Discharge To: Home with [facility]  Progressive Mobility  What is the highest level of mobility based on the mobility assessment? Level 3 (Stands with assistance) - Balance while standing  and cannot march in place  Mobility Referral No  Activity Stood at bedside;Dangled on edge of bed  PT Goal Progression  Progress towards PT goals Progressing toward goals  PT Time Calculation  PT Start Time (ACUTE ONLY) 1515  PT Stop Time (ACUTE ONLY) 1538  PT Time Calculation (min) (ACUTE ONLY) 23 min  PT General Charges  $$ ACUTE PT VISIT 1 Visit  PT Treatments  $Therapeutic Exercise 8-22 mins  $Therapeutic Activity 8-22 mins  Darice Bohr, PTA

## 2024-01-17 NOTE — ED Provider Notes (Signed)
 Emergency Medicine Observation Re-evaluation Note  Stacy Cook is a 88 y.o. female, seen on rounds today.  Pt initially presented to the ED for complaints of Fall and Weakness Currently, the patient is in no acute distress resting in bed  Physical Exam  BP (!) 133/56 (BP Location: Right Arm)   Pulse 76   Temp 97.6 F (36.4 C) (Oral)   Resp 18   Ht 5' 1 (1.549 m)   Wt 44.9 kg   SpO2 95%   BMI 18.71 kg/m  Physical Exam General: Well-appearing Cardiac: No acute distress Lungs: No respiratory distress   ED Course / MDM  EKG:EKG Interpretation Date/Time:  Monday January 13 2024 11:02:57 EDT Ventricular Rate:  72 PR Interval:    QRS Duration:  156 QT Interval:  486 QTC Calculation: 532 R Axis:   -84  Text Interpretation: Ventricular-paced rhythm Abnormal ECG When compared with ECG of 12-Nov-2023 09:50, Premature ventricular complexes are no longer Present Vent. rate has decreased BY  24 BPM Confirmed by UNCONFIRMED, DOCTOR ([id number]), editor Alex Slough [phone]) on 01/13/2024 2:05:41 PM  I have reviewed the labs performed to date as well as medications administered while in observation.  Recent changes in the last 24 hours: Had difficulty swallowing earlier today but was able to tolerate meals and feels better after GI cocktail  Plan  Current plan is for [facility]    Nicholaus Rolland BRAVO, MD 01/17/24 1616

## 2024-01-17 NOTE — Progress Notes (Signed)
 Spoke to Dr. Parris who saw patient today in ER. Discussed imaging findings and concerns for progressive disease. He recommends bronch for biopsy. For now, she needs to recover from respiratory illness and his office will follow up with her to schedule procedure and biopsy. We will see her back after biopsy performed for management. Will plan to send tissue for NGS.

## 2024-01-18 LAB — URINALYSIS, ROUTINE W REFLEX MICROSCOPIC
Bilirubin Urine: NEGATIVE
Glucose, UA: NEGATIVE mg/dL
Hgb urine dipstick: NEGATIVE
Ketones, ur: NEGATIVE mg/dL
Nitrite: NEGATIVE
Protein, ur: NEGATIVE mg/dL
Specific Gravity, Urine: 1.028 (ref 1.005–1.030)
pH: 5 (ref 5.0–8.0)

## 2024-01-18 NOTE — ED Notes (Signed)
Fall bundle in place

## 2024-01-18 NOTE — ED Provider Notes (Signed)
-----------------------------------------   6:05 AM on 01/18/2024 -----------------------------------------   Blood pressure (!) 124/59, pulse 75, temperature 98.3 F (36.8 C), resp. rate 14, height 5' 1 (1.549 m), weight 44.9 kg, SpO2 96%.  The patient is calm and cooperative at this time.  There have been no acute events since the last update.  Awaiting disposition plan from case management/social work.    Gerard Cantara, Josette SAILOR, DO 01/18/24 [phone]

## 2024-01-18 NOTE — ED Notes (Signed)
 Speech therapy in room with RN when administering medications. Patient took pills 1 at a time with boost. Patient had 1 episode of discomfort with the iron  pill. Patient burped after taking iron  pill. She took the rest of her medications without any issues.

## 2024-01-18 NOTE — ED Notes (Signed)
 Fall risk bundle is currently in place.

## 2024-01-18 NOTE — Evaluation (Signed)
 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 Volney performed Right lower lobe lobectomy.    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 08/03/2016   Carotid stenosis 02/07/2016   Cataract cortical, senile    Chest pain    Colon polyp    Degeneration of intervertebral disc of lumbar region 03/11/2014   Diabetes (HCC)    Gastric adenocarcinoma (HCC)    GERD (gastroesophageal reflux disease)    also, history of ulcers   History of kidney stones    History of stroke    HLD (hyperlipidemia) 08/24/2013   Hypertension    Malignant tumor of stomach (HCC) 07/2016   Adenocarcinoma, diffuse, poorly differentiated, signet ring, stage I   Neuritis or radiculitis due to rupture of lumbar intervertebral disc 09/10/2014   Neuroendocrine tumor (HCC) 03/24/2015   Neuroma    Osteoporosis    Peripheral vascular disease    PONV (postoperative nausea and vomiting)    Presence of permanent cardiac pacemaker 12/09/2018   Primary malignant neoplasm of right lower lobe of lung (HCC) 12/02/2015   Renal insufficiency    Renal stone    S/P TAVR (transcatheter aortic valve replacement)    Sciatica     Severe aortic stenosis    Skin cancer    cut/burned LUE; cut off right eye/nose & cut off chest (10/15/2017)   Stomach ulcer    Thrombocytopenia    Type 2 diabetes, diet controlled (HCC)    no RX since stomach OR 07/2016 (10/15/2017)   Past Surgical History:  Past Surgical History:  Procedure Laterality Date   APPENDECTOMY     CARDIAC CATHETERIZATION  X2 before 10/15/2017   CARDIAC VALVE REPLACEMENT     CATARACT EXTRACTION W/PHACO Left 04/04/2017   Procedure: CATARACT EXTRACTION PHACO AND INTRAOCULAR LENS PLACEMENT (IOC);  Surgeon: Mittie Gaskin, MD;  Location: [facility];  Service: Ophthalmology;  Laterality: Left;  Lot # [id number] H US  1:00 Ap 25% CDE 8.54   CATARACT EXTRACTION W/PHACO Right 05/15/2017   Procedure: CATARACT EXTRACTION PHACO AND INTRAOCULAR LENS PLACEMENT (IOC);  Surgeon: Mittie Gaskin, MD;  Location: [facility];  Service: Ophthalmology;  Laterality: Right;  US  01:10 AP% 18.3 CDE 12.91 Fluid pack lot # [id number]   COLONOSCOPY     CORONARY ATHERECTOMY N/A 10/15/2017   Procedure: CORONARY ATHERECTOMY;  Surgeon: Verlin Lonni BIRCH, MD;  Location: [facility];  Service: Cardiovascular;  Laterality: N/A;   CORONARY STENT INTERVENTION N/A 10/15/2017   Procedure: CORONARY STENT INTERVENTION;  Surgeon: Verlin Lonni BIRCH, MD;  Location: [facility];  Service: Cardiovascular;  Laterality: N/A;  CYSTOSCOPY/URETEROSCOPY/HOLMIUM LASER/STENT PLACEMENT Left 08/03/2019   Procedure: CYSTOSCOPY/URETEROSCOPY/HOLMIUM LASER/STENT PLACEMENT;  Surgeon: Penne Knee, MD;  Location: [facility];  Service: Urology;  Laterality: Left;   ESOPHAGOGASTRODUODENOSCOPY     ESOPHAGOGASTRODUODENOSCOPY (EGD) WITH PROPOFOL  N/A 03/27/2016   Procedure: ESOPHAGOGASTRODUODENOSCOPY (EGD) WITH PROPOFOL ;  Surgeon: Gladis RAYMOND Mariner, MD;  Location: [facility] END[facility]COPY;  Service: Endoscopy;  Laterality: N/A;   ESOPHAGOGASTRODUODENOSCOPY (EGD) WITH PROPOFOL  N/A 05/28/2016   Procedure:  ESOPHAGOGASTRODUODENOSCOPY (EGD) WITH PROPOFOL ;  Surgeon: Gladis RAYMOND Mariner, MD;  Location: [facility] ENDOSCOPY;  Service: Endoscopy;  Laterality: N/A;   ESOPHAGOGASTRODUODENOSCOPY (EGD) WITH PROPOFOL  N/A 09/25/2016   Procedure: ESOPHAGOGASTRODUODENOSCOPY (EGD) WITH PROPOFOL ;  Surgeon: Dellie Louanne MATSU, MD;  Location: [facility] ENDOSCOPY;  Service: Endoscopy;  Laterality: N/A;   ESOPHAGOGASTRODUODENOSCOPY (EGD) WITH PROPOFOL  N/A 12/18/2016   Procedure: ESOPHAGOGASTRODUODENOSCOPY (EGD) WITH PROPOFOL ;  Surgeon: Dellie Louanne MATSU, MD;  Location: [facility] END[facility];  Service: Endoscopy;  Laterality: N/A;   ESOPHAGOGASTRODUODENOSCOPY (EGD) WITH PROPOFOL  N/A 01/23/2017   Procedure: ESOPHAGOGASTRODUODENOSCOPY (EGD) WITH PROPOFOL ;  Surgeon: Dellie Louanne MATSU, MD;  Location: [facility];  Service: Endoscopy;  Laterality: N/A;   ESOPHAGOGASTRODUODENOSCOPY (EGD) WITH PROPOFOL  N/A 03/20/2017   Procedure: ESOPHAGOGASTRODUODENOSCOPY (EGD) WITH PROPOFOL ;  Surgeon: Dellie Louanne MATSU, MD;  Location: [facility];  Service: Endoscopy;  Laterality: N/A;   ESOPHAGOGASTRODUODENOSCOPY (EGD) WITH PROPOFOL  N/A 09/23/2020   Procedure: ESOPHAGOGASTRODUODENOSCOPY (EGD) WITH PROPOFOL ;  Surgeon: Maryruth Ole DASEN, MD;  Location: [facility] ENDOS[facility];  Service: Endoscopy;  Laterality: N/A;  KC SAYS AMPICILLIN IS NEEDED   FRACTURE SURGERY     INSERT / REPLACE / REMOVE PACEMAKER  12/09/2018   PACEMAKER LEADLESS INSERTION N/A 12/09/2018   Procedure: PACEMAKER LEADLESS INSERTION;  Surgeon: Kelsie Agent, MD;  [facility]: [facility];  Service: Cardiovascular;  Laterality: N/A;   PARTIAL GASTRECTOMY N/A 08/03/2016   Hemigastrectomy, Billroth I reconstruction Surgeon: Louanne MATSU Dellie, MD;  Location: [facility];  Service: General;  Laterality: N/A;   RIGHT/LEFT HEART CATH AND CORONARY ANGIOGRAPHY Bilateral 09/19/2017   Procedure: RIGHT/LEFT HEART CATH AND CORONARY ANGIOGRAPHY;  Surgeon: Florencio Cara BIRCH, MD;   Location: [facility];  Service: Cardiovascular;  Laterality: Bilateral;   SHOULDER ARTHROSCOPY W/ CAPSULAR REPAIR Right    SKIN CANCER EXCISION     cut/burned LUE; cut off right eye/nose & cut off chest (10/15/2017)   TEE WITHOUT CARDIOVERSION N/A 12/02/2018   Procedure: TRANSESOPHAGEAL ECHOCARDIOGRAM (TEE);  Surgeon: Verlin Lonni BIRCH, MD;  Location: [facility];  Service: Open Heart Surgery;  Laterality: N/A;   THORACOTOMY Right 05/09/2015   Procedure: THORACOTOMY, RIGHT LOWER LOBECTOMY, BRONCHOSCOPY;  Surgeon: Evalene Glasser, MD;  Location: [facility];  Service: Thoracic;  Laterality: Right;   TONSILLECTOMY  1944   TRANSCATHETER AORTIC VALVE REPLACEMENT, TRANSFEMORAL N/A 12/02/2018   Procedure: TRANSCATHETER AORTIC VALVE REPLACEMENT, TRANSFEMORAL;  Surgeon: Verlin Lonni BIRCH, MD;  [facility]: [facility];  Service: Open Heart Surgery;  Laterality: N/A;   TUBAL LIGATION     UPPER GI ENDOSCOPY N/A 08/03/2016   Procedure: UPPER  ENDOSCOPY;  Surgeon: Louanne MATSU Dellie, MD;  Location: [facility];  Service: General;  Laterality: N/A;   VAGINAL HYSTERECTOMY     WRIST FRACTURE SURGERY Right    HPI:  Per ED Provider Note, Caitlan Chauca is a 88 y.o. female with history of diabetes, GERD, CHF, PAD, CKD, lung cancer, is on Eliquis , presenting with right hip pain after fall.  Patient states that she was bending over, lost her balance and fell over to the right side,  hit her right hip and thigh, also sustained a skin tear to her right elbow.  She denies hitting her head.  No LOC.  No chest pain or shortness of breath, no lightheadedness.  Denies any new cough or urinary symptoms.  No nausea vomiting or diarrhea.  Denies any weakness or numbness.  States that she has been feeling weak for the last several weeks, has had decreased appetite but has been ongoing for a while.  Patient lives by self at home, typically ambulates without walker or cane.     Per independent history  from son, patient has been compliant with her medications, lives at home alone.     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 CXR: Tumor burden in the right hemithorax, better evaluated on PET  01/08/2024.  2. 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  3. Otherwise, no new acute findings. CT Head: No evidence of acute intracranial abnormality or acute cervical  spine fracture.    Assessment / Plan / Recommendation  Clinical Impression  Pt seen for bedside swallow assessment in the setting of report of choking with water. Per OP internal med office visit She has a persistent cough that occurs when she turns over in bed at night, described as 'right in my throat,' without sputum production, only saliva and water. Her voice occasionally disappears for several days, and she experiences rhinorrhea when eating. She has difficulty swallowing pills, needing to take them two at a time due to choking, though she does not choke on food. Water causes issues, and she feels pills stay in her throat, requiring a hard cookie to help them go down. She has not had any issues with solid foods, however Pt had been referred to ENT, but pt did not follow up with ENT bc the sensation had subsided. Pt previously seen by SLP services following CVA ( left anterior pons and punctate areas of acute infarct in the left frontal lobe) in 2020 targeting dysarthria. EGD in 2022 revealing, normal esophagus.  Today, pt resting in bed, on room air, afebrile, with WBC WNL. Reports intermittent post nasal drip and sensation of reflux, specifically occurring at night, and aided by sleeping in upright position. Oral motor function grossly intact. Trials completed of thin liquid via cup  (in isolation and medication) and regular solids. No overt or subtle s/sx pharyngeal dysphagia noted. No change to vocal quality across trials. Oral manipulation and clearance grossly intact.   Of note, pt with intermittent belching with liquids. Globus sensation reported with medication, with pt managing with increased upright positioning and extended time between medication trials.   Based on age, current debility/deconditioning, pulmonary status (hx of lung cancer), and report of esophageal concerns, pt is at increased risk of aspiration during/after the swallow. Recommend aspiration precautions (slow rate, small bites, elevated HOB during/after intake, and alert for PO intake). Continue with current unrestricted diet. Recommend GI follow up. SLP will monitor for continued safety with current diet. MD and RN aware of recommendations.      SLP Visit Diagnosis: Dysphagia, unspecified (R13.10) (potential esophageal concern)    Aspiration Risk  Mild aspiration risk    Diet Recommendation   Age appropriate regular;Thin  Medication Administration: Whole meds with liquid (one at a time-  in puree if needed)    Other  Recommendations Recommended Consults: Consider GI evaluation;Consider esophageal assessment Oral Care Recommendations: Oral care BID;Patient independent with oral care     Assistance Recommended at Discharge  Intermittent supervision for PO intake  Functional Status Assessment Patient has had a recent decline in their functional status and demonstrates the ability to make significant improvements in function in a reasonable and predictable amount of time. (? chronicity of current symptoms- pt reported concern starting ~ 4 weeks ago)  Frequency and Duration min 2x/week  2 weeks       Prognosis Prognosis for improved oropharyngeal function: Fair Barriers to Reach Goals: Time post onset      Swallow Study   General Date of Onset: 01/18/24 HPI: Per ED Provider Note, Stacy Cook is a 88 y.o. female with history of diabetes, GERD, CHF, PAD, CKD, lung cancer, is on Eliquis , presenting with right hip pain after fall.  Patient states that she was bending over, lost her balance and fell over to the right side, hit her right hip and thigh, also sustained a skin tear to her right elbow.  She denies hitting her head.  No LOC.  No chest pain or shortness of breath, no lightheadedness.  Denies any new cough or urinary symptoms.  No nausea vomiting or diarrhea.  Denies any weakness or numbness.  States that she has been feeling weak for the last several weeks, has had decreased appetite but has been ongoing for a while.  Patient lives by self at home, typically ambulates without walker or cane.     Per independent history from son, patient has been compliant with her medications, lives at home alone.     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 CXR: Tumor burden in the right hemithorax, better evaluated on PET  01/08/2024.  2. 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  3. Otherwise, no new acute findings. CT Head: No evidence of acute intracranial abnormality or acute cervical  spine fracture. Type of Study: Bedside Swallow Evaluation Previous Swallow Assessment: no dysphagia intervention previously Diet Prior to this Study: Regular;Thin liquids (Level 0) Temperature Spikes Noted: No Respiratory Status: Room air History of Recent Intubation: No Behavior/Cognition: Alert;Cooperative;Pleasant mood Oral Cavity Assessment: Within Functional Limits Oral Care Completed by SLP: Yes Oral Cavity - Dentition: Adequate natural dentition Vision: Functional for self-feeding Self-Feeding Abilities: Able to feed self Patient Positioning:  Upright in bed Baseline Vocal Quality: Normal Volitional Cough: Strong Volitional Swallow: Able to elicit    Oral/Motor/Sensory Function Overall Oral Motor/Sensory Function: Within functional limits   Ice Chips Ice chips: Not tested   Thin Liquid Thin Liquid: Within functional limits Presentation: Cup;Self Fed    Nectar Thick Nectar Thick Liquid: Not tested   Honey Thick Honey Thick Liquid: Not tested   Puree Puree: Not tested   Solid     Solid: Within functional limits Presentation: Self Fed      [place] J Clapp 01/18/2024,11:06 AM

## 2024-01-19 NOTE — ED Provider Notes (Signed)
-----------------------------------------   6:09 AM on 01/19/2024 -----------------------------------------   Blood pressure (!) 138/49, pulse 82, temperature 98.4 F (36.9 C), temperature source Oral, resp. rate 16, height 5' 1 (1.549 m), weight 44.9 kg, SpO2 100%.  The patient is calm and cooperative at this time.  There have been no acute events since the last update.  Awaiting disposition plan from case management/social work.    Arlyss Weathersby, Josette SAILOR, DO 01/19/24 [phone]

## 2024-01-20 MED ORDER — ENSURE PLUS HIGH PROTEIN PO LIQD
237.0000 mL | Freq: Two times a day (BID) | ORAL | Status: DC
  Administered 2024-01-20 – 2024-01-23 (×7): 237 mL via ORAL

## 2024-01-20 NOTE — Progress Notes (Signed)
 Speech Language Pathology Treatment: Dysphagia  Patient Details Name: Stacy Cook MRN: [id number] DOB: 1936-01-13 Today's Date: 01/20/2024 Time: 1130-1150 SLP Time Calculation (min) (ACUTE ONLY): 20 min  Assessment / Plan / Recommendation Clinical Impression  Pt seen for dysphagia intervention following bedside swallow evaluation on 10/11. Pt afebrile, on room air, WBC WNL, and no updated chest imaging. Pt reporting minimal issue with PO intake in last 2 days, with overall reduced intake (consistent with baseline). Pt/RN denied coughing with PO. Pt seen with trials of regular solids and thin liquids. No overt or subtle s/sx pharyngeal dysphagia noted. No change to vocal quality across trials. Oral phase unremarkable. Education shared to reinforce aspiration precautions (specifically elevated HOB) and diet options provided for energy conservation. Pt reports desire to maintain regular diet.   Recommend continued thin liquids and regular solids. Given completion of education and gross tolerance of PO intake- no further acute SLP services indicated. Continued report of globus sensation and belching warrants further GI work up.    HPI HPI: Per ED Provider Note, Stacy Cook is a 88 y.o. female with history of diabetes, GERD, CHF, PAD, CKD, lung cancer, is on Eliquis , presenting with right hip pain after fall.  Patient states that she was bending over, lost her balance and fell over to the right side, hit her right hip and thigh, also sustained a skin tear to her right elbow.  She denies hitting her head.  No LOC.  No chest pain or shortness of breath, no lightheadedness.  Denies any new cough or urinary symptoms.  No nausea vomiting or diarrhea.  Denies any weakness or numbness.  States that she has been feeling weak for the last several weeks, has had decreased appetite but has been ongoing for a while.  Patient lives by self at home, typically ambulates without walker or cane.     Per  independent history from son, patient has been compliant with her medications, lives at home alone.     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 CXR: Tumor burden in the right hemithorax, better evaluated on PET  01/08/2024.  2. 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  3. Otherwise, no new acute findings. CT Head: No evidence of acute intracranial abnormality or acute cervical  spine fracture.      SLP Plan  All goals met          Recommendations  Diet recommendations: Regular;Thin liquid Liquids provided via: Cup;Straw Medication Administration: Whole meds with liquid (one at a time- in puree if needed) Supervision: Patient able to self feed;Intermittent supervision to cue for compensatory strategies Compensations: Minimize environmental distractions;Slow rate;Small sips/bites Postural Changes and/or Swallow Maneuvers: Seated upright 90 degrees;Upright 30-60 min after meal                  Oral care BID;Patient independent with oral care   PRN Dysphagia, unspecified (R13.10) (potential esophageal concern)     All goals met    [place] Carolee Channell Clapp, MS, CCC-SLP Speech Language Pathologist Rehab Services; [facility] [phone] (ascom)   [place] J Clapp  01/20/2024, 1:29 PM

## 2024-01-20 NOTE — ED Notes (Signed)
 Pt called out to be put back in bed from the chair, assisted also with changing her brief and purewick and also applied a butt heart due to her bottom becoming red, fall bundle in place, will bring a warm blanket later tonight. All needs met at this time

## 2024-01-20 NOTE — ED Provider Notes (Signed)
 Emergency Medicine Observation Re-evaluation Note  Stacy Cook is a 88 y.o. female, seen on rounds today.  Pt initially presented to the ED for complaints of Fall and Weakness No acute events overnight  Physical Exam  BP (!) 119/59 (BP Location: Right Arm)   Pulse 76   Temp 98 F (36.7 C) (Oral)   Resp 16   Ht 1.549 m (5' 1)   Wt 44.9 kg   SpO2 100%   BMI 18.71 kg/m     ED Course / MDM  Here for fall/weakness  Plan  Current plan is for [facility] disposition    Arlander Charleston, MD 01/20/24 1008

## 2024-01-20 NOTE — Progress Notes (Signed)
 Pt continues to have pain and R LE buckling in standing requiring ModA for safety and balance when transferring bed>chair w/o assistive device.   01/20/24 1600  PT Visit Information  Assistance Needed +1  History of Present Illness Stacy Cook is a 88 y.o. female with history of diabetes, GERD, CHF, PAD, CKD, lung cancer, is on Eliquis , presenting with right hip pain after fall.  MRI significant for high-grade, partial-thickness tear to R glut med insertion, low-grade, partial-thickness tear to R glut min insertion, R adductor strain and R hip AVN; cleared per ortho for WBAT  Subjective Data  Patient Stated Goal to move better and get stronger  Precautions  Precautions Fall  Recall of Precautions/Restrictions Intact  Restrictions  Weight Bearing Restrictions Per Provider Order No  RLE Weight Bearing Per Provider Order WBAT  Pain Assessment  Pain Assessment Faces  Faces Pain Scale 4  Pain Location R anterior thigh/hip  Pain Descriptors / Indicators Discomfort;Grimacing;Shooting;Sharp  Pain Intervention(s) Limited activity within patient's tolerance  Cognition  Arousal Alert  Behavior During Therapy WFL for tasks assessed/performed  PT - Cognitive impairments No apparent impairments  Following Commands  Following commands Intact  Cueing  Cueing Techniques Verbal cues;Visual cues;Tactile cues  Communication  Communication No apparent difficulties  Bed Mobility  Overal bed mobility Needs Assistance  Bed Mobility Supine to Sit  Rolling Min assist;Used rails  General bed mobility comments Pt required min A to manage R LE in and out of  2/2 pain for supine<>sit. Pt able to perform bridging and use UEs to boost self up in bed. Min A for rolling in bed with verbal cues for hand placement.  Transfers  Overall transfer level Needs assistance  Equipment used None  Transfers Sit to/from Stand;Bed to chair/wheelchair/BSC  Sit to Stand Mod assist  Bed to/from chair/wheelchair/BSC  transfer type: Step pivot  Stand pivot transfers Min assist;Mod assist  General transfer comment  (Able to stand from bed and side step to Right with ModA and B UE's supported on therapist, +R LE buckling x 3)  Ambulation/Gait  General Gait Details  (Not formally tested due to R LE buckling with increased risk for fall)  Balance  Overall balance assessment Needs assistance  Sitting-balance support Feet supported;No upper extremity supported  Sitting balance-Leahy Scale Good  Standing balance support Bilateral upper extremity supported;During functional activity;Reliant on assistive device for balance  Standing balance-Leahy Scale Fair  Standing balance comment  (High fall risk)  PT - End of Session  Equipment Utilized During Treatment Gait belt  Activity Tolerance Patient tolerated treatment well  Patient left in chair;with call bell/phone within reach;with chair alarm set  Nurse Communication Mobility status   PT - Assessment/Plan  PT Visit Diagnosis Unsteadiness on feet (R26.81);Muscle weakness (generalized) (M62.81);History of falling (Z91.81);Difficulty in walking, not elsewhere classified (R26.2)  PT Frequency (ACUTE ONLY) Min 2X/week  Follow Up Recommendations Skilled nursing-short term rehab (<3 hours/day)  Can patient physically be transported by private vehicle Yes  Patient can return home with the following A little help with walking and/or transfers;A little help with bathing/dressing/bathroom  PT equipment None recommended by PT  AM-PAC PT 6 Clicks Mobility Outcome Measure (Version 2)  Help needed turning from your back to your side while in a flat bed without using bedrails? 3  Help needed moving from lying on your back to sitting on the side of a flat bed without using bedrails? 2  Help needed moving to and from a bed to a  chair (including a wheelchair)? 3  Help needed standing up from a chair using your arms (e.g., wheelchair or bedside chair)? 3  Help needed to walk  in hospital room? 3  Help needed climbing 3-5 steps with a railing?  2  6 Click Score 16  Consider Recommendation of Discharge To: [facility] with [facility]  Progressive Mobility  What is the highest level of mobility based on the mobility assessment? Level 3 (Stands with assistance) - Balance while standing  and cannot march in place  Activity Stood at bedside;Pivoted/transferred from bed to chair  PT Goal Progression  Progress towards PT goals Progressing toward goals  PT Time Calculation  PT Start Time (ACUTE ONLY) 1516  PT Stop Time (ACUTE ONLY) 1530  PT Time Calculation (min) (ACUTE ONLY) 14 min  PT General Charges  $$ ACUTE PT VISIT 1 Visit  PT Treatments  $Therapeutic Activity 8-22 mins  Darice Bohr, PTA

## 2024-01-20 NOTE — TOC Progression Note (Signed)
 Transition of Care [facility]) - Progression Note    Patient Details  Name: Stacy Cook MRN: [id number] Date of Birth: January 26, 1936  Transition of Care [facility]) CM/SW Contact  K'La JINNY Ruts, LCSW Phone Number: 01/20/2024, 2:57 PM  Clinical Narrative:    Chart reviewed. Followed up with patient son and would like to consult with the patient about [place] health [facility] option.                      Expected Discharge Plan and Services                                               Social Drivers of Health (SDOH) Interventions SDOH Screenings   Food Insecurity: No Food Insecurity (11/25/2023)   Received from [facility]  Housing: Low Risk  (12/16/2023)   Received from [facility]  Transportation Needs: No Transportation Needs (11/25/2023)   Received from [facility]  Utilities: Not At Risk (11/25/2023)   Received from [facility]  Depression [phone]): Low Risk  (02/02/2019)  Financial Resource Strain: Low Risk  (11/25/2023)   Received from [facility]  Tobacco Use: Low Risk  (01/13/2024)    Readmission Risk Interventions     No data to display

## 2024-01-20 NOTE — TOC Progression Note (Signed)
 Transition of Care [facility]) - Progression Note    Patient Details  Name: Stacy Cook MRN: [id number] Date of Birth: 01-06-1936  Transition of Care [facility]) CM/SW Contact  K'La JINNY Ruts, LCSW Phone Number: 01/20/2024, 11:53 AM  Clinical Narrative:    Chart reviewed. I spoke with the patient at bedside. I introduced my self, my role, and reason for consult. I informed the patient of the accepting bed offers and the patient redirected me to consult with her son [name].   I called the patient son, Carmalita Wakefield. I introduced myself, my role, and reason for consult. The patient son was receptive of the bed offers. The patient son reports that he would like to research information on [facility] care and get back with me. The patient son was interested in [place].  This SW was informed that [facility] was not in net work with [place]. TOC will follow up with the patient son in decision for placement.                     Expected Discharge Plan and Services                                               Social Drivers of Health (SDOH) Interventions SDOH Screenings   Food Insecurity: No Food Insecurity (11/25/2023)   Received from [facility]  Housing: Low Risk  (12/16/2023)   Received from [facility]  Transportation Needs: No Transportation Needs (11/25/2023)   Received from [facility]  Utilities: Not At Risk (11/25/2023)   Received from [facility]  Depression [phone]): Low Risk  (02/02/2019)  Financial Resource Strain: Low Risk  (11/25/2023)   Received from [facility]  Tobacco Use: Low Risk  (01/13/2024)    Readmission Risk Interventions     No data to display

## 2024-01-21 ENCOUNTER — Other Ambulatory Visit: Payer: Self-pay

## 2024-01-21 NOTE — ED Provider Notes (Signed)
 Emergency Medicine Observation Re-evaluation Note   BP (!) 119/59 (BP Location: Right Arm)   Pulse 76   Temp (!) 97.5 F (36.4 C) (Oral)   Resp 16   Ht 5' 1 (1.549 m)   Wt 44.9 kg   SpO2 100%   BMI 18.71 kg/m    ED Course / MDM   No reported events during my shift at the time of this note.   Pt is awaiting dispo from consultants   Ginnie Shams MD    Shams Ginnie, MD 01/21/24 [phone]

## 2024-01-21 NOTE — TOC Progression Note (Addendum)
 Transition of Care [facility]) - Progression Note    Patient Details  Name: Stacy Cook MRN: [id number] Date of Birth: 05-Aug-1935  Transition of Care [facility]) CM/SW Contact  [name], RN Phone Number: 01/21/2024, 1:41 PM  Clinical Narrative:    This CM spoke with pt's son introduced role and provided  additional bed offers  listed in the HUB for him to review and make a selection. TOC will cont to follow dc planning / care coordination and update as applicable.     15:02p- This CM spoke with pt's son [name] again and was provided his choice selection for SNF or pt is [employer]. This CM reached out to Admission liaison at [facility] and left message updating of above info , awaiting response to cont coordinating when pt can be rec'd at facility. TOC will cont to follow dc planning / care coordination and update as applicable.   15:27p - This CM rec'd call back from  Admission Liasion  at [facility] and confirmed bed offer . Ins auth to be initiated . Expected Discharge Plan and Services    SNF    Social Drivers of Health (SDOH) Interventions SDOH Screenings   Food Insecurity: No Food Insecurity (11/25/2023)   Received from [facility]  Housing: Low Risk  (12/16/2023)   Received from [facility]  Transportation Needs: No Transportation Needs (11/25/2023)   Received from [facility]  Utilities: Not At Risk (11/25/2023)   Received from [facility]  Depression [phone]): Low Risk  (02/02/2019)  Financial Resource Strain: Low Risk  (11/25/2023)   Received from [facility]  Tobacco Use: Low Risk  (01/13/2024)    Readmission Risk Interventions     No data to display

## 2024-01-21 NOTE — ED Notes (Signed)
 Pt denies complaints or needs at this time.

## 2024-01-21 NOTE — TOC Progression Note (Signed)
 Transition of Care [facility]) - Progression Note    Patient Details  Name: Stacy Cook MRN: [id number] Date of Birth: 05-05-1935  Transition of Care [facility]) CM/SW Contact  Lauraine JAYSON Carpen, LCSW Phone Number: 01/21/2024, 4:19 PM  Clinical Narrative:   [facility] approved: [id number]. Valid 10/14-10/16. Left message for [facility] admissions coordinator to notify and see how soon they will have a bed.  Expected Discharge Plan and Services                                               Social Drivers of Health (SDOH) Interventions SDOH Screenings   Food Insecurity: No Food Insecurity (11/25/2023)   Received from [facility]  Housing: Low Risk  (12/16/2023)   Received from [facility]  Transportation Needs: No Transportation Needs (11/25/2023)   Received from [facility]  Utilities: Not At Risk (11/25/2023)   Received from [facility]  Depression [phone]): Low Risk  (02/02/2019)  Financial Resource Strain: Low Risk  (11/25/2023)   Received from [facility]  Tobacco Use: Low Risk  (01/13/2024)    Readmission Risk Interventions     No data to display

## 2024-01-22 NOTE — Progress Notes (Signed)
 Session modified due to pt requesting BSC for possible BM. Pt continues to require assist for transfers and step pivot bed<>chair due to R LE buckling. High fall risk, currently using bear hug technique for transfers in order to prevent falling, will progress to RW w/ gait belt next session. Pt required increased time on [facility], left with call bell, nursing notified.    01/22/24 1100  PT Visit Information  Assistance Needed +1  History of Present Illness Stacy Cook is a 88 y.o. female with history of diabetes, GERD, CHF, PAD, CKD, lung cancer, is on Eliquis , presenting with right hip pain after fall.  MRI significant for high-grade, partial-thickness tear to R glut med insertion, low-grade, partial-thickness tear to R glut min insertion, R adductor strain and R hip AVN; cleared per ortho for WBAT  Subjective Data  Patient Stated Goal to move better and get stronger  Precautions  Precautions Fall  Recall of Precautions/Restrictions Intact  Restrictions  Weight Bearing Restrictions Per Provider Order No  RLE Weight Bearing Per Provider Order WBAT  Other Position/Activity Restrictions  (R Glut Med/Min and adductor tears)  Pain Assessment  Pain Assessment Faces  Faces Pain Scale 4  Pain Location R anterior thigh/hip  Pain Descriptors / Indicators Discomfort;Grimacing;Shooting;Sharp  Pain Intervention(s) Limited activity within patient's tolerance  Cognition  Arousal Alert  Behavior During Therapy WFL for tasks assessed/performed  PT - Cognitive impairments No apparent impairments  Following Commands  Following commands Intact  Communication  Communication No apparent difficulties  Bed Mobility  Overal bed mobility Needs Assistance  Bed Mobility Supine to Sit  Rolling Min assist;Used rails  General bed mobility comments Pt required min A to manage R LE pain level supine<>sit. verbal cues for hand placement.  Transfers  Overall transfer level Needs assistance  Equipment used None   Transfers Sit to/from Stand;Bed to chair/wheelchair/BSC  Sit to Stand Mod assist  Bed to/from chair/wheelchair/BSC transfer type: Step pivot  Step pivot transfers Mod assist  General transfer comment able to stand from via [facility] and side step to [facility]. Pt's R LE continues to buckle in standing  Ambulation/Gait  General Gait Details  (No true gait this session due to pt requesting BSC for BM, also High risk for falling due to buckling)  Balance  Overall balance assessment Needs assistance  Sitting-balance support Feet supported;No upper extremity supported  Sitting balance-Leahy Scale Good  Standing balance support Bilateral upper extremity supported;During functional activity;Reliant on assistive device for balance  Standing balance-Leahy Scale Fair  Standing balance comment  (High fall risk due to R LE buckling)  General Comments  General comments (skin integrity, edema, etc.)  (Purewick removed, pt encouraged to use BSC.)  PT - End of Session  Equipment Utilized During Treatment Gait belt  Activity Tolerance Patient tolerated treatment well  Patient left in chair;with call bell/phone within reach;Other (comment) (On BSC, nursing notified)  Nurse Communication Mobility status;Other (comment) (Blood tinged urine, pt states this has been happening on/off for last 2 weeks)   PT - Assessment/Plan  PT Visit Diagnosis Unsteadiness on feet (R26.81);Muscle weakness (generalized) (M62.81);History of falling (Z91.81);Difficulty in walking, not elsewhere classified (R26.2)  PT Frequency (ACUTE ONLY) Min 2X/week  Follow Up Recommendations Skilled nursing-short term rehab (<3 hours/day)  Can patient physically be transported by private vehicle Yes  Patient can return home with the following A little help with walking and/or transfers;A little help with bathing/dressing/bathroom  PT equipment None recommended by PT  AM-PAC PT 6 Clicks  Mobility Outcome Measure (Version 2)  Help needed  turning from your back to your side while in a flat bed without using bedrails? 3  Help needed moving from lying on your back to sitting on the side of a flat bed without using bedrails? 2  Help needed moving to and from a bed to a chair (including a wheelchair)? 3  Help needed standing up from a chair using your arms (e.g., wheelchair or bedside chair)? 3  Help needed to walk in hospital room? 3  Help needed climbing 3-5 steps with a railing?  2  6 Click Score 16  Consider Recommendation of Discharge To: Home with [facility]  Progressive Mobility  What is the highest level of mobility based on the mobility assessment? Level 3 (Stands with assistance) - Balance while standing  and cannot march in place  Activity Stood at bedside;Pivoted/transferred to/from [facility]  PT Time Calculation  PT Start Time (ACUTE ONLY) 1010  PT Stop Time (ACUTE ONLY) 1029  PT Time Calculation (min) (ACUTE ONLY) 19 min  PT General Charges  $$ ACUTE PT VISIT 1 Visit  PT Treatments  $Therapeutic Activity 8-22 mins  Darice Bohr, PTA

## 2024-01-22 NOTE — TOC Progression Note (Signed)
 Transition of Care [facility]) - Progression Note    Patient Details  Name: Stacy Cook MRN: [id number] Date of Birth: [birth year]  Transition of Care [facility]) CM/SW Contact  [place] L [place]oel [place], [place] Phone Number: 01/22/2024, 9:27 AM  Clinical Narrative:        CSW spoke with [facility]. [facility] advised that shara has been approved. Projected bed availability is for 01/23/24.   CSW will follow-up tomorrow.                  Expected Discharge Plan and Services                                               Social Drivers of Health (SDOH) Interventions SDOH Screenings   Food Insecurity: No Food Insecurity (11/25/2023)   Received from [facility]  Housing: Low Risk  (12/16/2023)   Received from [facility]  Transportation Needs: No Transportation Needs (11/25/2023)   Received from [facility]  Utilities: Not At Risk (11/25/2023)   Received from [facility]  Depression [phone]): Low Risk  (02/02/2019)  Financial Resource Strain: Low Risk  (11/25/2023)   Received from [facility]  Tobacco Use: Low Risk  (01/13/2024)    Readmission Risk Interventions     No data to display

## 2024-01-22 NOTE — ED Provider Notes (Signed)
-----------------------------------------   6:14 AM on 01/22/2024 -----------------------------------------   Blood pressure (!) 130/58, pulse 78, temperature 97.8 F (36.6 C), temperature source Oral, resp. rate 20, height 1.549 m (5' 1), weight 44.9 kg, SpO2 100%.  The patient is calm and cooperative at this time.  There have been no acute events since the last update.  Awaiting disposition plan from [facility] team.   Gordan Huxley, MD 01/22/24 [phone]

## 2024-01-22 NOTE — ED Notes (Signed)
 Unable to perform all hourly rounding due to emergent situation with another pt.

## 2024-01-22 NOTE — ED Notes (Signed)
 Pt repositioned in bed. Pt tolerated well. NADN

## 2024-01-23 ENCOUNTER — Other Ambulatory Visit: Payer: Self-pay

## 2024-01-23 DIAGNOSIS — E785 Hyperlipidemia, unspecified: Secondary | ICD-10-CM | POA: Diagnosis not present

## 2024-01-23 DIAGNOSIS — M25511 Pain in right shoulder: Secondary | ICD-10-CM | POA: Diagnosis not present

## 2024-01-23 DIAGNOSIS — I5032 Chronic diastolic (congestive) heart failure: Secondary | ICD-10-CM | POA: Diagnosis not present

## 2024-01-23 DIAGNOSIS — R531 Weakness: Secondary | ICD-10-CM | POA: Diagnosis not present

## 2024-01-23 DIAGNOSIS — Z743 Need for continuous supervision: Secondary | ICD-10-CM | POA: Diagnosis not present

## 2024-01-23 DIAGNOSIS — E440 Moderate protein-calorie malnutrition: Secondary | ICD-10-CM | POA: Diagnosis not present

## 2024-01-23 DIAGNOSIS — E441 Mild protein-calorie malnutrition: Secondary | ICD-10-CM | POA: Diagnosis not present

## 2024-01-23 DIAGNOSIS — E1122 Type 2 diabetes mellitus with diabetic chronic kidney disease: Secondary | ICD-10-CM | POA: Diagnosis not present

## 2024-01-23 DIAGNOSIS — I959 Hypotension, unspecified: Secondary | ICD-10-CM | POA: Diagnosis not present

## 2024-01-23 DIAGNOSIS — N184 Chronic kidney disease, stage 4 (severe): Secondary | ICD-10-CM | POA: Diagnosis not present

## 2024-01-23 DIAGNOSIS — N189 Chronic kidney disease, unspecified: Secondary | ICD-10-CM | POA: Diagnosis not present

## 2024-01-23 DIAGNOSIS — I495 Sick sinus syndrome: Secondary | ICD-10-CM | POA: Diagnosis not present

## 2024-01-23 DIAGNOSIS — G459 Transient cerebral ischemic attack, unspecified: Secondary | ICD-10-CM | POA: Diagnosis not present

## 2024-01-23 DIAGNOSIS — F411 Generalized anxiety disorder: Secondary | ICD-10-CM | POA: Diagnosis not present

## 2024-01-23 DIAGNOSIS — S76011A Strain of muscle, fascia and tendon of right hip, initial encounter: Secondary | ICD-10-CM | POA: Diagnosis not present

## 2024-01-23 DIAGNOSIS — S51811A Laceration without foreign body of right forearm, initial encounter: Secondary | ICD-10-CM | POA: Diagnosis not present

## 2024-01-23 DIAGNOSIS — C3431 Malignant neoplasm of lower lobe, right bronchus or lung: Secondary | ICD-10-CM | POA: Diagnosis not present

## 2024-01-23 DIAGNOSIS — M6281 Muscle weakness (generalized): Secondary | ICD-10-CM | POA: Diagnosis not present

## 2024-01-23 DIAGNOSIS — Z23 Encounter for immunization: Secondary | ICD-10-CM | POA: Diagnosis not present

## 2024-01-23 DIAGNOSIS — Z7901 Long term (current) use of anticoagulants: Secondary | ICD-10-CM | POA: Diagnosis not present

## 2024-01-23 DIAGNOSIS — M25551 Pain in right hip: Secondary | ICD-10-CM | POA: Diagnosis not present

## 2024-01-23 DIAGNOSIS — I69951 Hemiplegia and hemiparesis following unspecified cerebrovascular disease affecting right dominant side: Secondary | ICD-10-CM | POA: Diagnosis not present

## 2024-01-23 DIAGNOSIS — I509 Heart failure, unspecified: Secondary | ICD-10-CM | POA: Diagnosis not present

## 2024-01-23 DIAGNOSIS — Z952 Presence of prosthetic heart valve: Secondary | ICD-10-CM | POA: Diagnosis not present

## 2024-01-23 DIAGNOSIS — R1312 Dysphagia, oropharyngeal phase: Secondary | ICD-10-CM | POA: Diagnosis not present

## 2024-01-23 DIAGNOSIS — E876 Hypokalemia: Secondary | ICD-10-CM | POA: Diagnosis not present

## 2024-01-23 DIAGNOSIS — Z85118 Personal history of other malignant neoplasm of bronchus and lung: Secondary | ICD-10-CM | POA: Diagnosis not present

## 2024-01-23 DIAGNOSIS — J90 Pleural effusion, not elsewhere classified: Secondary | ICD-10-CM | POA: Diagnosis not present

## 2024-01-23 DIAGNOSIS — S51011A Laceration without foreign body of right elbow, initial encounter: Secondary | ICD-10-CM | POA: Diagnosis not present

## 2024-01-23 DIAGNOSIS — S76091D Other specified injury of muscle, fascia and tendon of right hip, subsequent encounter: Secondary | ICD-10-CM | POA: Diagnosis not present

## 2024-01-23 DIAGNOSIS — I10 Essential (primary) hypertension: Secondary | ICD-10-CM | POA: Diagnosis not present

## 2024-01-23 MED ORDER — ONDANSETRON 4 MG PO TBDP
4.0000 mg | ORAL_TABLET | Freq: Once | ORAL | Status: AC
  Administered 2024-01-23: 4 mg via ORAL
  Filled 2024-01-23: qty 1

## 2024-01-23 NOTE — ED Notes (Signed)
 [facility] called for transport to [facility] , spoke with Alm

## 2024-01-23 NOTE — ED Provider Notes (Signed)
 Vitals:   01/22/24 1719 01/22/24 2100  BP: 121/61 (!) 129/48  Pulse: 83 76  Resp: 18 18  Temp:  98.4 F (36.9 C)  SpO2: 100% 96%   No issues overnight.   Ernest Ronal BRAVO, MD 01/23/24 [phone]

## 2024-01-23 NOTE — ED Provider Notes (Signed)
 Patient boarding in the ED over the past 10 days.  Social work has found her a spot at a local SNF that cannot accommodate the patient this afternoon.  Will discharge to them.    Stacy Rover, MD 01/23/24 1344

## 2024-01-23 NOTE — TOC Progression Note (Addendum)
 Transition of Care [facility]) - Progression Note    Patient Details  Name: Stacy Cook MRN: [id number] Date of Birth: May 11, 1935  Transition of Care [facility]) CM/SW Contact  [place] L Kon[place], [place] Phone Number: 01/23/2024, 11:27 AM  Clinical Narrative:      CSW met with patient and called [name], son, on the phone to discuss SNF placement. Patient advised that [facility] does not have an open bed. CSW provided patient with a list of facilities who offered a bed. Patient and son advised that they would like to accept bed at [facility]. [facility] will be switched to [facility].      1:45pm: CSW received a call from [place], [place]. Bed offer rescinded. CSW spoke with Massie, [facility], who advised that they now have a bed for patient. Patient can transfer to the facility later this afternoon per [facility].   CSW called the patient son, [name], and advised of update. [name] was agreeable to patient transferring to [facility].               Expected Discharge Plan and Services                                               Social Drivers of Health (SDOH) Interventions SDOH Screenings   Food Insecurity: No Food Insecurity (11/25/2023)   Received from [facility]  Housing: Low Risk  (12/16/2023)   Received from [facility]  Transportation Needs: No Transportation Needs (11/25/2023)   Received from [facility]  Utilities: Not At Risk (11/25/2023)   Received from [facility]  Depression [phone]): Low Risk  (02/02/2019)  Financial Resource Strain: Low Risk  (11/25/2023)   Received from [facility]  Tobacco Use: Low Risk  (01/13/2024)    Readmission Risk Interventions     No data to display

## 2024-01-23 NOTE — ED Notes (Signed)
 Patient's belongings packed up and placed in belongings bag in preparation for transport to facility.

## 2024-01-24 DIAGNOSIS — I509 Heart failure, unspecified: Secondary | ICD-10-CM | POA: Diagnosis not present

## 2024-01-24 DIAGNOSIS — I69951 Hemiplegia and hemiparesis following unspecified cerebrovascular disease affecting right dominant side: Secondary | ICD-10-CM | POA: Diagnosis not present

## 2024-01-24 DIAGNOSIS — M25551 Pain in right hip: Secondary | ICD-10-CM | POA: Diagnosis not present

## 2024-01-24 DIAGNOSIS — F411 Generalized anxiety disorder: Secondary | ICD-10-CM | POA: Diagnosis not present

## 2024-01-24 DIAGNOSIS — Z952 Presence of prosthetic heart valve: Secondary | ICD-10-CM | POA: Diagnosis not present

## 2024-01-24 DIAGNOSIS — M6281 Muscle weakness (generalized): Secondary | ICD-10-CM | POA: Diagnosis not present

## 2024-01-24 DIAGNOSIS — S51811A Laceration without foreign body of right forearm, initial encounter: Secondary | ICD-10-CM | POA: Diagnosis not present

## 2024-01-24 DIAGNOSIS — I10 Essential (primary) hypertension: Secondary | ICD-10-CM | POA: Diagnosis not present

## 2024-01-24 DIAGNOSIS — E441 Mild protein-calorie malnutrition: Secondary | ICD-10-CM | POA: Diagnosis not present

## 2024-01-24 DIAGNOSIS — I495 Sick sinus syndrome: Secondary | ICD-10-CM | POA: Diagnosis not present

## 2024-01-24 DIAGNOSIS — M25511 Pain in right shoulder: Secondary | ICD-10-CM | POA: Diagnosis not present

## 2024-01-24 DIAGNOSIS — E440 Moderate protein-calorie malnutrition: Secondary | ICD-10-CM | POA: Diagnosis not present

## 2024-01-25 DIAGNOSIS — I69951 Hemiplegia and hemiparesis following unspecified cerebrovascular disease affecting right dominant side: Secondary | ICD-10-CM | POA: Diagnosis not present

## 2024-01-25 DIAGNOSIS — M25551 Pain in right hip: Secondary | ICD-10-CM | POA: Diagnosis not present

## 2024-01-25 DIAGNOSIS — E876 Hypokalemia: Secondary | ICD-10-CM | POA: Diagnosis not present

## 2024-01-25 DIAGNOSIS — M25511 Pain in right shoulder: Secondary | ICD-10-CM | POA: Diagnosis not present

## 2024-01-25 DIAGNOSIS — E440 Moderate protein-calorie malnutrition: Secondary | ICD-10-CM | POA: Diagnosis not present

## 2024-01-26 DIAGNOSIS — I69951 Hemiplegia and hemiparesis following unspecified cerebrovascular disease affecting right dominant side: Secondary | ICD-10-CM | POA: Diagnosis not present

## 2024-01-26 DIAGNOSIS — M6281 Muscle weakness (generalized): Secondary | ICD-10-CM | POA: Diagnosis not present

## 2024-01-26 DIAGNOSIS — E440 Moderate protein-calorie malnutrition: Secondary | ICD-10-CM | POA: Diagnosis not present

## 2024-01-26 DIAGNOSIS — I509 Heart failure, unspecified: Secondary | ICD-10-CM | POA: Diagnosis not present

## 2024-01-26 DIAGNOSIS — M25511 Pain in right shoulder: Secondary | ICD-10-CM | POA: Diagnosis not present

## 2024-01-26 DIAGNOSIS — M25551 Pain in right hip: Secondary | ICD-10-CM | POA: Diagnosis not present

## 2024-01-27 DIAGNOSIS — S51811A Laceration without foreign body of right forearm, initial encounter: Secondary | ICD-10-CM | POA: Diagnosis not present

## 2024-01-27 DIAGNOSIS — I10 Essential (primary) hypertension: Secondary | ICD-10-CM | POA: Diagnosis not present

## 2024-01-27 DIAGNOSIS — F411 Generalized anxiety disorder: Secondary | ICD-10-CM | POA: Diagnosis not present

## 2024-01-27 DIAGNOSIS — E441 Mild protein-calorie malnutrition: Secondary | ICD-10-CM | POA: Diagnosis not present

## 2024-01-28 DIAGNOSIS — M6281 Muscle weakness (generalized): Secondary | ICD-10-CM | POA: Diagnosis not present

## 2024-01-28 DIAGNOSIS — S76091D Other specified injury of muscle, fascia and tendon of right hip, subsequent encounter: Secondary | ICD-10-CM | POA: Diagnosis not present

## 2024-01-28 DIAGNOSIS — J90 Pleural effusion, not elsewhere classified: Secondary | ICD-10-CM | POA: Diagnosis not present

## 2024-01-28 DIAGNOSIS — E441 Mild protein-calorie malnutrition: Secondary | ICD-10-CM | POA: Diagnosis not present

## 2024-01-29 ENCOUNTER — Other Ambulatory Visit: Payer: Self-pay | Admitting: Internal Medicine

## 2024-01-29 ENCOUNTER — Other Ambulatory Visit (HOSPITAL_COMMUNITY): Payer: Self-pay

## 2024-01-29 ENCOUNTER — Other Ambulatory Visit: Payer: Self-pay

## 2024-01-29 DIAGNOSIS — M25551 Pain in right hip: Secondary | ICD-10-CM | POA: Diagnosis not present

## 2024-01-29 DIAGNOSIS — E876 Hypokalemia: Secondary | ICD-10-CM | POA: Diagnosis not present

## 2024-01-29 DIAGNOSIS — I10 Essential (primary) hypertension: Secondary | ICD-10-CM | POA: Diagnosis not present

## 2024-01-29 DIAGNOSIS — C3431 Malignant neoplasm of lower lobe, right bronchus or lung: Secondary | ICD-10-CM

## 2024-01-29 DIAGNOSIS — I69951 Hemiplegia and hemiparesis following unspecified cerebrovascular disease affecting right dominant side: Secondary | ICD-10-CM | POA: Diagnosis not present

## 2024-01-29 DIAGNOSIS — M6281 Muscle weakness (generalized): Secondary | ICD-10-CM | POA: Diagnosis not present

## 2024-01-29 DIAGNOSIS — S76011A Strain of muscle, fascia and tendon of right hip, initial encounter: Secondary | ICD-10-CM | POA: Diagnosis not present

## 2024-01-29 NOTE — Progress Notes (Signed)
 Specialty Pharmacy Refill Coordination Note  Stacy Cook is a 88 y.o. female contacted today regarding refills of specialty medication(s) Eltrombopag  Olamine (PROMACTA ); Osimertinib  Mesylate (TAGRISSO )   Patient requested Delivery   Delivery date: 02/03/24   Verified address: [address]  [place], [place]   Medication will be filled on 01/31/24.

## 2024-01-30 ENCOUNTER — Other Ambulatory Visit: Payer: Self-pay

## 2024-01-30 DIAGNOSIS — S76091D Other specified injury of muscle, fascia and tendon of right hip, subsequent encounter: Secondary | ICD-10-CM | POA: Diagnosis not present

## 2024-01-30 DIAGNOSIS — M6281 Muscle weakness (generalized): Secondary | ICD-10-CM | POA: Diagnosis not present

## 2024-01-30 DIAGNOSIS — E441 Mild protein-calorie malnutrition: Secondary | ICD-10-CM | POA: Diagnosis not present

## 2024-01-30 DIAGNOSIS — J90 Pleural effusion, not elsewhere classified: Secondary | ICD-10-CM | POA: Diagnosis not present

## 2024-01-31 ENCOUNTER — Other Ambulatory Visit: Payer: Self-pay

## 2024-01-31 ENCOUNTER — Other Ambulatory Visit: Payer: Self-pay | Admitting: Internal Medicine

## 2024-01-31 DIAGNOSIS — C3431 Malignant neoplasm of lower lobe, right bronchus or lung: Secondary | ICD-10-CM

## 2024-01-31 MED ORDER — OSIMERTINIB MESYLATE 80 MG PO TABS
80.0000 mg | ORAL_TABLET | Freq: Every day | ORAL | 1 refills | Status: DC
  Filled 2024-01-31: qty 30, 30d supply, fill #0
  Filled 2024-02-27: qty 30, 30d supply, fill #1

## 2024-02-03 ENCOUNTER — Other Ambulatory Visit: Payer: Self-pay

## 2024-02-03 DIAGNOSIS — I5032 Chronic diastolic (congestive) heart failure: Secondary | ICD-10-CM | POA: Diagnosis not present

## 2024-02-03 DIAGNOSIS — I10 Essential (primary) hypertension: Secondary | ICD-10-CM | POA: Diagnosis not present

## 2024-02-03 DIAGNOSIS — F411 Generalized anxiety disorder: Secondary | ICD-10-CM | POA: Diagnosis not present

## 2024-02-03 DIAGNOSIS — E441 Mild protein-calorie malnutrition: Secondary | ICD-10-CM | POA: Diagnosis not present

## 2024-02-04 DIAGNOSIS — I509 Heart failure, unspecified: Secondary | ICD-10-CM | POA: Diagnosis not present

## 2024-02-04 DIAGNOSIS — E785 Hyperlipidemia, unspecified: Secondary | ICD-10-CM | POA: Diagnosis not present

## 2024-02-04 DIAGNOSIS — I10 Essential (primary) hypertension: Secondary | ICD-10-CM | POA: Diagnosis not present

## 2024-02-04 DIAGNOSIS — Z952 Presence of prosthetic heart valve: Secondary | ICD-10-CM | POA: Diagnosis not present

## 2024-02-04 DIAGNOSIS — E876 Hypokalemia: Secondary | ICD-10-CM | POA: Diagnosis not present

## 2024-02-04 DIAGNOSIS — M25551 Pain in right hip: Secondary | ICD-10-CM | POA: Diagnosis not present

## 2024-02-04 DIAGNOSIS — I69951 Hemiplegia and hemiparesis following unspecified cerebrovascular disease affecting right dominant side: Secondary | ICD-10-CM | POA: Diagnosis not present

## 2024-02-05 DIAGNOSIS — I110 Hypertensive heart disease with heart failure: Secondary | ICD-10-CM | POA: Diagnosis not present

## 2024-02-05 DIAGNOSIS — S76091D Other specified injury of muscle, fascia and tendon of right hip, subsequent encounter: Secondary | ICD-10-CM | POA: Diagnosis not present

## 2024-02-05 DIAGNOSIS — E1151 Type 2 diabetes mellitus with diabetic peripheral angiopathy without gangrene: Secondary | ICD-10-CM | POA: Diagnosis not present

## 2024-02-05 DIAGNOSIS — C3431 Malignant neoplasm of lower lobe, right bronchus or lung: Secondary | ICD-10-CM | POA: Diagnosis not present

## 2024-02-05 DIAGNOSIS — D631 Anemia in chronic kidney disease: Secondary | ICD-10-CM | POA: Diagnosis not present

## 2024-02-05 DIAGNOSIS — N184 Chronic kidney disease, stage 4 (severe): Secondary | ICD-10-CM | POA: Diagnosis not present

## 2024-02-05 DIAGNOSIS — I2510 Atherosclerotic heart disease of native coronary artery without angina pectoris: Secondary | ICD-10-CM | POA: Diagnosis not present

## 2024-02-05 DIAGNOSIS — E1122 Type 2 diabetes mellitus with diabetic chronic kidney disease: Secondary | ICD-10-CM | POA: Diagnosis not present

## 2024-02-05 DIAGNOSIS — I5032 Chronic diastolic (congestive) heart failure: Secondary | ICD-10-CM | POA: Diagnosis not present

## 2024-02-06 DIAGNOSIS — E1151 Type 2 diabetes mellitus with diabetic peripheral angiopathy without gangrene: Secondary | ICD-10-CM | POA: Diagnosis not present

## 2024-02-10 DIAGNOSIS — N184 Chronic kidney disease, stage 4 (severe): Secondary | ICD-10-CM | POA: Diagnosis not present

## 2024-02-10 DIAGNOSIS — I2510 Atherosclerotic heart disease of native coronary artery without angina pectoris: Secondary | ICD-10-CM | POA: Diagnosis not present

## 2024-02-10 DIAGNOSIS — E1122 Type 2 diabetes mellitus with diabetic chronic kidney disease: Secondary | ICD-10-CM | POA: Diagnosis not present

## 2024-02-10 DIAGNOSIS — C3431 Malignant neoplasm of lower lobe, right bronchus or lung: Secondary | ICD-10-CM | POA: Diagnosis not present

## 2024-02-10 DIAGNOSIS — D631 Anemia in chronic kidney disease: Secondary | ICD-10-CM | POA: Diagnosis not present

## 2024-02-10 DIAGNOSIS — E1151 Type 2 diabetes mellitus with diabetic peripheral angiopathy without gangrene: Secondary | ICD-10-CM | POA: Diagnosis not present

## 2024-02-10 DIAGNOSIS — S76091D Other specified injury of muscle, fascia and tendon of right hip, subsequent encounter: Secondary | ICD-10-CM | POA: Diagnosis not present

## 2024-02-10 DIAGNOSIS — I5032 Chronic diastolic (congestive) heart failure: Secondary | ICD-10-CM | POA: Diagnosis not present

## 2024-02-10 DIAGNOSIS — I110 Hypertensive heart disease with heart failure: Secondary | ICD-10-CM | POA: Diagnosis not present

## 2024-02-11 DIAGNOSIS — S76091D Other specified injury of muscle, fascia and tendon of right hip, subsequent encounter: Secondary | ICD-10-CM | POA: Diagnosis not present

## 2024-02-11 DIAGNOSIS — I2510 Atherosclerotic heart disease of native coronary artery without angina pectoris: Secondary | ICD-10-CM | POA: Diagnosis not present

## 2024-02-11 DIAGNOSIS — C3431 Malignant neoplasm of lower lobe, right bronchus or lung: Secondary | ICD-10-CM | POA: Diagnosis not present

## 2024-02-11 DIAGNOSIS — N184 Chronic kidney disease, stage 4 (severe): Secondary | ICD-10-CM | POA: Diagnosis not present

## 2024-02-11 DIAGNOSIS — E1122 Type 2 diabetes mellitus with diabetic chronic kidney disease: Secondary | ICD-10-CM | POA: Diagnosis not present

## 2024-02-11 DIAGNOSIS — I110 Hypertensive heart disease with heart failure: Secondary | ICD-10-CM | POA: Diagnosis not present

## 2024-02-11 DIAGNOSIS — D631 Anemia in chronic kidney disease: Secondary | ICD-10-CM | POA: Diagnosis not present

## 2024-02-11 DIAGNOSIS — E1151 Type 2 diabetes mellitus with diabetic peripheral angiopathy without gangrene: Secondary | ICD-10-CM | POA: Diagnosis not present

## 2024-02-11 DIAGNOSIS — I5032 Chronic diastolic (congestive) heart failure: Secondary | ICD-10-CM | POA: Diagnosis not present

## 2024-02-12 DIAGNOSIS — S76091D Other specified injury of muscle, fascia and tendon of right hip, subsequent encounter: Secondary | ICD-10-CM | POA: Diagnosis not present

## 2024-02-12 DIAGNOSIS — N184 Chronic kidney disease, stage 4 (severe): Secondary | ICD-10-CM | POA: Diagnosis not present

## 2024-02-12 DIAGNOSIS — E1151 Type 2 diabetes mellitus with diabetic peripheral angiopathy without gangrene: Secondary | ICD-10-CM | POA: Diagnosis not present

## 2024-02-12 DIAGNOSIS — E1122 Type 2 diabetes mellitus with diabetic chronic kidney disease: Secondary | ICD-10-CM | POA: Diagnosis not present

## 2024-02-12 DIAGNOSIS — D631 Anemia in chronic kidney disease: Secondary | ICD-10-CM | POA: Diagnosis not present

## 2024-02-12 DIAGNOSIS — I110 Hypertensive heart disease with heart failure: Secondary | ICD-10-CM | POA: Diagnosis not present

## 2024-02-12 DIAGNOSIS — I5032 Chronic diastolic (congestive) heart failure: Secondary | ICD-10-CM | POA: Diagnosis not present

## 2024-02-13 ENCOUNTER — Encounter: Payer: Self-pay | Admitting: Internal Medicine

## 2024-02-13 DIAGNOSIS — M87051 Idiopathic aseptic necrosis of right femur: Secondary | ICD-10-CM | POA: Insufficient documentation

## 2024-02-14 ENCOUNTER — Telehealth: Payer: Self-pay | Admitting: Internal Medicine

## 2024-02-14 DIAGNOSIS — D696 Thrombocytopenia, unspecified: Secondary | ICD-10-CM | POA: Diagnosis not present

## 2024-02-14 DIAGNOSIS — I4891 Unspecified atrial fibrillation: Secondary | ICD-10-CM | POA: Diagnosis not present

## 2024-02-14 DIAGNOSIS — E1122 Type 2 diabetes mellitus with diabetic chronic kidney disease: Secondary | ICD-10-CM | POA: Diagnosis not present

## 2024-02-14 DIAGNOSIS — I129 Hypertensive chronic kidney disease with stage 1 through stage 4 chronic kidney disease, or unspecified chronic kidney disease: Secondary | ICD-10-CM | POA: Diagnosis not present

## 2024-02-14 DIAGNOSIS — I69351 Hemiplegia and hemiparesis following cerebral infarction affecting right dominant side: Secondary | ICD-10-CM | POA: Diagnosis not present

## 2024-02-14 DIAGNOSIS — I4892 Unspecified atrial flutter: Secondary | ICD-10-CM | POA: Diagnosis not present

## 2024-02-14 DIAGNOSIS — Z23 Encounter for immunization: Secondary | ICD-10-CM | POA: Diagnosis not present

## 2024-02-14 DIAGNOSIS — E1151 Type 2 diabetes mellitus with diabetic peripheral angiopathy without gangrene: Secondary | ICD-10-CM | POA: Diagnosis not present

## 2024-02-14 DIAGNOSIS — M87051 Idiopathic aseptic necrosis of right femur: Secondary | ICD-10-CM | POA: Diagnosis not present

## 2024-02-14 DIAGNOSIS — M87052 Idiopathic aseptic necrosis of left femur: Secondary | ICD-10-CM | POA: Diagnosis not present

## 2024-02-14 DIAGNOSIS — Z85028 Personal history of other malignant neoplasm of stomach: Secondary | ICD-10-CM | POA: Diagnosis not present

## 2024-02-14 DIAGNOSIS — C3431 Malignant neoplasm of lower lobe, right bronchus or lung: Secondary | ICD-10-CM | POA: Diagnosis not present

## 2024-02-14 NOTE — Telephone Encounter (Signed)
 Pt called to r/s missed appt - said she had been in the hospital. R/s lab and MD appts w/pt - [facility]

## 2024-02-18 ENCOUNTER — Encounter: Payer: Self-pay | Admitting: Internal Medicine

## 2024-02-18 DIAGNOSIS — S76091D Other specified injury of muscle, fascia and tendon of right hip, subsequent encounter: Secondary | ICD-10-CM | POA: Diagnosis not present

## 2024-02-18 DIAGNOSIS — E1151 Type 2 diabetes mellitus with diabetic peripheral angiopathy without gangrene: Secondary | ICD-10-CM | POA: Diagnosis not present

## 2024-02-18 DIAGNOSIS — E1122 Type 2 diabetes mellitus with diabetic chronic kidney disease: Secondary | ICD-10-CM | POA: Diagnosis not present

## 2024-02-18 DIAGNOSIS — I110 Hypertensive heart disease with heart failure: Secondary | ICD-10-CM | POA: Diagnosis not present

## 2024-02-18 DIAGNOSIS — N184 Chronic kidney disease, stage 4 (severe): Secondary | ICD-10-CM | POA: Diagnosis not present

## 2024-02-18 DIAGNOSIS — D631 Anemia in chronic kidney disease: Secondary | ICD-10-CM | POA: Diagnosis not present

## 2024-02-18 DIAGNOSIS — I2510 Atherosclerotic heart disease of native coronary artery without angina pectoris: Secondary | ICD-10-CM | POA: Diagnosis not present

## 2024-02-18 DIAGNOSIS — C3431 Malignant neoplasm of lower lobe, right bronchus or lung: Secondary | ICD-10-CM | POA: Diagnosis not present

## 2024-02-18 DIAGNOSIS — I5032 Chronic diastolic (congestive) heart failure: Secondary | ICD-10-CM | POA: Diagnosis not present

## 2024-02-19 ENCOUNTER — Other Ambulatory Visit: Payer: Self-pay | Admitting: Pulmonary Disease

## 2024-02-19 DIAGNOSIS — C3431 Malignant neoplasm of lower lobe, right bronchus or lung: Secondary | ICD-10-CM | POA: Diagnosis not present

## 2024-02-19 DIAGNOSIS — J90 Pleural effusion, not elsewhere classified: Secondary | ICD-10-CM | POA: Diagnosis not present

## 2024-02-19 DIAGNOSIS — R911 Solitary pulmonary nodule: Secondary | ICD-10-CM | POA: Diagnosis not present

## 2024-02-19 DIAGNOSIS — R590 Localized enlarged lymph nodes: Secondary | ICD-10-CM | POA: Diagnosis not present

## 2024-02-19 DIAGNOSIS — R5381 Other malaise: Secondary | ICD-10-CM | POA: Diagnosis not present

## 2024-02-20 ENCOUNTER — Telehealth (HOSPITAL_BASED_OUTPATIENT_CLINIC_OR_DEPARTMENT_OTHER): Payer: Self-pay

## 2024-02-20 DIAGNOSIS — S76091D Other specified injury of muscle, fascia and tendon of right hip, subsequent encounter: Secondary | ICD-10-CM | POA: Diagnosis not present

## 2024-02-20 NOTE — Telephone Encounter (Signed)
 Patient with diagnosis of atrial fibrillation on Eliquis  for anticoagulation.    Procedure:  EBUS/ Robotic Bronchoscopy    Date of Surgery:  Clearance 02/28/24      CHA2DS2-VASc Score = 9   This indicates a 12.2% annual risk of stroke. The patient's score is based upon: CHF History: 1 HTN History: 1 Diabetes History: 1 Stroke History: 2 Vascular Disease History: 1 Age Score: 2 Gender Score: 1    CrCl 24 Platelet count 103  Patient has not had an Afib/aflutter ablation in the last 3 months, DCCV within the last 4 weeks or a watchman implanted in the last 45 days   Due to high CHADS2-VASc score, will need to review with primary cardiologist as to whether patient should be bridged with enoxaparin .   Because of low CrCl, will need a 3 day hold of Eliquis  for procedure.      **This guidance is not considered finalized until pre-operative APP has relayed final recommendations.**

## 2024-02-20 NOTE — Telephone Encounter (Signed)
   Pre-operative Risk Assessment    Patient Name: Stacy Cook  DOB: [birth year] MRN: [id number]   Date of last office visit: 11/12/23 with Leverne  Date of next office visit: 03/03/24 with Leverne    Request for Surgical Clearance    Procedure:  EBUS/ Robotic Bronchoscopy   Date of Surgery:  Clearance 02/28/24                                 Surgeon:  Dr. Parris Surgeon's Group or Practice Name:  [facility]  Phone number:  [phone] Fax number:  [phone]   Type of Clearance Requested:   - Medical  - Pharmacy:  Hold Apixaban  (Eliquis ) not indicated   Type of Anesthesia:  General    Additional requests/questions:    [name]   02/20/2024, 8:26 AM

## 2024-02-21 ENCOUNTER — Encounter: Payer: Self-pay | Admitting: Internal Medicine

## 2024-02-21 ENCOUNTER — Encounter
Admission: RE | Admit: 2024-02-21 | Discharge: 2024-02-21 | Disposition: A | Discharge status: Home / Self Care | Source: Ambulatory Visit | Attending: Pulmonary Disease | Admitting: Pulmonary Disease

## 2024-02-21 ENCOUNTER — Other Ambulatory Visit: Payer: Self-pay

## 2024-02-21 ENCOUNTER — Inpatient Hospital Stay (HOSPITAL_BASED_OUTPATIENT_CLINIC_OR_DEPARTMENT_OTHER): Admitting: Internal Medicine

## 2024-02-21 ENCOUNTER — Inpatient Hospital Stay: Attending: Internal Medicine

## 2024-02-21 VITALS — Ht 61.0 in | Wt 93.0 lb

## 2024-02-21 VITALS — BP 125/62 | HR 76 | Temp 97.2°F | Resp 18 | Ht 61.0 in | Wt 93.7 lb

## 2024-02-21 DIAGNOSIS — I2510 Atherosclerotic heart disease of native coronary artery without angina pectoris: Secondary | ICD-10-CM | POA: Diagnosis not present

## 2024-02-21 DIAGNOSIS — Z01812 Encounter for preprocedural laboratory examination: Secondary | ICD-10-CM

## 2024-02-21 DIAGNOSIS — C3431 Malignant neoplasm of lower lobe, right bronchus or lung: Secondary | ICD-10-CM

## 2024-02-21 DIAGNOSIS — E876 Hypokalemia: Secondary | ICD-10-CM | POA: Diagnosis not present

## 2024-02-21 DIAGNOSIS — E1159 Type 2 diabetes mellitus with other circulatory complications: Secondary | ICD-10-CM

## 2024-02-21 DIAGNOSIS — M810 Age-related osteoporosis without current pathological fracture: Secondary | ICD-10-CM | POA: Diagnosis not present

## 2024-02-21 DIAGNOSIS — N184 Chronic kidney disease, stage 4 (severe): Secondary | ICD-10-CM | POA: Diagnosis not present

## 2024-02-21 DIAGNOSIS — C160 Malignant neoplasm of cardia: Secondary | ICD-10-CM | POA: Diagnosis not present

## 2024-02-21 DIAGNOSIS — E1122 Type 2 diabetes mellitus with diabetic chronic kidney disease: Secondary | ICD-10-CM | POA: Diagnosis not present

## 2024-02-21 DIAGNOSIS — I129 Hypertensive chronic kidney disease with stage 1 through stage 4 chronic kidney disease, or unspecified chronic kidney disease: Secondary | ICD-10-CM | POA: Diagnosis not present

## 2024-02-21 DIAGNOSIS — D693 Immune thrombocytopenic purpura: Secondary | ICD-10-CM | POA: Diagnosis not present

## 2024-02-21 HISTORY — DX: Anxiety disorder, unspecified: F41.9

## 2024-02-21 LAB — CBC WITH DIFFERENTIAL (CANCER CENTER ONLY)
Abs Immature Granulocytes: 0.02 K/uL (ref 0.00–0.07)
Basophils Absolute: 0 K/uL (ref 0.0–0.1)
Basophils Relative: 0 %
Eosinophils Absolute: 0.1 K/uL (ref 0.0–0.5)
Eosinophils Relative: 1 %
HCT: 35 % — ABNORMAL LOW (ref 36.0–46.0)
Hemoglobin: 11.3 g/dL — ABNORMAL LOW (ref 12.0–15.0)
Immature Granulocytes: 0 %
Lymphocytes Relative: 15 %
Lymphs Abs: 0.7 K/uL (ref 0.7–4.0)
MCH: 30.5 pg (ref 26.0–34.0)
MCHC: 32.3 g/dL (ref 30.0–36.0)
MCV: 94.3 fL (ref 80.0–100.0)
Monocytes Absolute: 0.3 K/uL (ref 0.1–1.0)
Monocytes Relative: 7 %
Neutro Abs: 3.7 K/uL (ref 1.7–7.7)
Neutrophils Relative %: 77 %
Platelet Count: 87 K/uL — ABNORMAL LOW (ref 150–400)
RBC: 3.71 MIL/uL — ABNORMAL LOW (ref 3.87–5.11)
RDW: 14.1 % (ref 11.5–15.5)
WBC Count: 4.8 K/uL (ref 4.0–10.5)
nRBC: 0 % (ref 0.0–0.2)

## 2024-02-21 LAB — CMP (CANCER CENTER ONLY)
ALT: 9 U/L (ref 0–44)
AST: 18 U/L (ref 15–41)
Albumin: 3.5 g/dL (ref 3.5–5.0)
Alkaline Phosphatase: 93 U/L (ref 38–126)
Anion gap: 8 (ref 5–15)
BUN: 29 mg/dL — ABNORMAL HIGH (ref 8–23)
CO2: 23 mmol/L (ref 22–32)
Calcium: 8.9 mg/dL (ref 8.9–10.3)
Chloride: 110 mmol/L (ref 98–111)
Creatinine: 1.16 mg/dL — ABNORMAL HIGH (ref 0.44–1.00)
GFR, Estimated: 46 mL/min — ABNORMAL LOW (ref >60)
Glucose, Bld: 94 mg/dL (ref 70–99)
Potassium: 3.5 mmol/L (ref 3.5–5.1)
Sodium: 141 mmol/L (ref 135–145)
Total Bilirubin: 0.8 mg/dL (ref 0.0–1.2)
Total Protein: 6.5 g/dL (ref 6.5–8.1)

## 2024-02-21 LAB — IRON AND TIBC
Iron: 99 ug/dL (ref 28–170)
Saturation Ratios: 45 % — ABNORMAL HIGH (ref 10.4–31.8)
TIBC: 221 ug/dL — ABNORMAL LOW (ref 250–450)
UIBC: 123 ug/dL

## 2024-02-21 LAB — FERRITIN: Ferritin: 586 ng/mL — ABNORMAL HIGH (ref 11–307)

## 2024-02-21 NOTE — Patient Instructions (Addendum)
 Your procedure is scheduled on:   FRIDAY  NOVEMBER 21  Report to the Registration Desk on the 1st floor of the [facility] To find out your arrival time, please call [phone] between 1PM - 3PM on:  THURSDAY  NOVEMBER 20 If your arrival time is 6:00 am, do not arrive before that time as the [facility] entrance doors do not open until 6:00 am.  REMEMBER: Instructions that are not followed completely may result in serious medical risk, up to and including death; or upon the discretion of your surgeon and anesthesiologist your surgery may need to be rescheduled.  Do not eat food after midnight the night before surgery.  No gum chewing or hard candies.   One week prior to surgery:  STARTING FRIDAY  NOVEMBER  14  Stop Anti-inflammatories (NSAIDS) such as Advil, Aleve, Ibuprofen, Motrin, Naproxen, Naprosyn and Aspirin  based products such as Excedrin, Goody's Powder, BC Powder. Stop ANY OVER THE COUNTER supplements until after surgery. cholecalciferol  (VITAMIN D )  cyanocobalamin  B12  ferrous sulfate  325 (65 FE)  Magnesium    You may however, continue to take Tylenol  if needed for pain up until the day of surgery.  **Follow guidelines for insulin  and diabetes medications.**  **Follow recommendations regarding stopping blood thinners.** apixaban  (ELIQUIS ) per pharmacy hold 3 days, last dose MONDAY NOVEMBER 17   Continue taking all of your other prescription medications up until the day of surgery.  ON THE DAY OF SURGERY ONLY TAKE THESE MEDICATIONS WITH SIPS OF WATER:  eltrombopag  (PROMACTA )  isosorbide  mononitrate (IMDUR )  osimertinib  mesylate (TAGRISSO )   Use inhalers on the day of surgery and bring to the hospital. ipratropium (ATROVENT)   Do not use any recreational drugs for at least a week (preferably 2 weeks) before your surgery.  Please be advised that the combination of cocaine and anesthesia may have negative outcomes, up to and including death. If you test positive  for cocaine, your surgery will be cancelled.  On the morning of surgery brush your teeth with toothpaste and water, you may rinse your mouth with mouthwash if you wish. Do not swallow any toothpaste or mouthwash.  Do not wear jewelry, make-up, hairpins, clips or nail polish.  For welded (permanent) jewelry: bracelets, anklets, waist bands, etc.  Please have this removed prior to surgery.  If it is not removed, there is a chance that hospital personnel will need to cut it off on the day of surgery.  Do not wear lotions, powders, or perfumes.   Contact lenses, hearing aids and dentures may not be worn into surgery.  Do not bring valuables to the hospital. [facility] is not responsible for any missing/lost belongings or valuables.   Notify your doctor if there is any change in your medical condition (cold, fever, infection).  Wear comfortable clothing (specific to your surgery type) to the hospital.  After surgery, you can help prevent lung complications by doing breathing exercises.  Take deep breaths and cough every 1-2 hours.  If you are being discharged the day of surgery, you will not be allowed to drive home. You will need a responsible individual to drive you home and stay with you for 24 hours after surgery.   If you are taking public transportation, you will need to have a responsible individual with you.  Please call the Pre-admissions Testing Dept. at [phone] if you have any questions about these instructions.  Surgery Visitation Policy:  Patients having surgery or a procedure may have two visitors.  Children under the age of 69 must have an adult with them who is not the patient.  [employer]ch[employer] to address health-related social needs:  [email or web address]

## 2024-02-21 NOTE — Progress Notes (Signed)
 Fell 01/13/24. PET 01/08/24, needs results. Dr. Parris wants to do a bronchoscopy, but she wants to know why she needs it?

## 2024-02-21 NOTE — Telephone Encounter (Signed)
 Office calling back to f/u on Eliquis  being held for procedure on 11/21. Office states Eliquis  needs to be held starting on this Sunday (11/16). Their office closes at 4:00 today. Please advise

## 2024-02-21 NOTE — Assessment & Plan Note (Addendum)
#   Stage IV recurrent adenocarcinoma the lung; EGFR mutated. On osimertinib  80 mg [Feb 6th 2020] #  currently on Osimertinib . PET scan OCT 2025-Disease progression as evidenced by a new hypermetabolic dominant right middle lobe nodule with additional subpleural hypermetabolic nodules in the right hemithorax. 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  # I discussed my concerns for progressive disease based on imaging; and symptomatology.  Discussed that median PFS of osimertinib  is around 16 to 18 months; patient is has been on it since 5-1/2 years.  Unfortunately, progression of disease expected on therapies.  # Discussed the importance of confirming the recurrent disease; and also the importance of NGS testing for further therapy options.  Given patient's medical problems/patient is a borderline candidate for chemotherapy.  Ordered blood NGS-[facility]/today.  Patient reluctant with bronchoscopy-which is planned on October 21.  Will discuss with Dr. DELENA.  #Right pleural effusion-status postthoracentesis-pleural fluid: Transudative; cytology negative for malignancy.  Question CHF/fluid overload.  S/p Lasix  20 mg a day- improved/stable. Currently not on any lasix  [ARF]- stable.   # reflux- reocmmend nexium/prilosec OTC.   # Chronic anemia- Hb 10-18 Dec 2021- Iron  sat- 22%; ferritin- 137- continue Iron  pills once day. Hold off Venofer - STABLE.   # ITP- chronic- platelets> 100 on promacta  25 mg/day- STABLE.   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 STABLE.   # A-fib [feb 2024] on Eliquis  2.5/cardiology. STABLE.   # right sided stroke/ seizures-on asprin; off plavix ;; f/u neurology-[place]-  STABLE.   # CKD- stage IV; Left Hydronephrosis s/p ureteral stent placement/nephrolithiasis-~ GFR 34 today;  Hypokalemia-3.4  continue K-citrate q day.STABLE.   # IV ACCESS: PIV  # I had a long discussion the patient regarding the overall difficult situation where she has progressive disease in the context of multiple medical problems and frailty.  Patient wants to fight; and is willing to proceed with treatments. Will also recommend palliative care evaluatoin at next visit. Will need chemo education for dato deruxtecan.   # DISPOSITION: # [facility] blood test today-  # follow up in 2-3 weeks-  MD; labs- cbc/cmp- Dr.B  # 40 minutes face-to-face with the patient discussing the above plan of care; more than 50% of time spent on prognosis/ natural history; counseling and coordination.

## 2024-02-21 NOTE — Telephone Encounter (Signed)
 Pre-op covering team, can you please let surgical team know that we are waiting to hear back from Cardiologist (Dr. Cindie) on whether or not she needs to be bridged given high CHA2DS-VASc score. So far Pharmacy has recommended holding Eliquis  for 3 days prior to procedure but the question is whether she needs to be bridged. She is also going to need a virtual visit after we get a response from Dr. Cindie.   Thank you!

## 2024-02-21 NOTE — Telephone Encounter (Signed)
 FYI pt has appt 03/03/24 Stacy Cook, pac as well

## 2024-02-21 NOTE — Telephone Encounter (Signed)
 Office calling to f/u on [facility] being that pt is scheduled for procedure next week and needs to Hold Eliquis  starting this Sunday. Please advise   FYI - Office closes at 4:00 pm today

## 2024-02-21 NOTE — Progress Notes (Signed)
 [facility] OFFICE PROGRESS NOTE  Patient Care Team: [name], MD as PCP - General (Internal Medicine) Cindie Ole DASEN, MD as PCP - Electrophysiology (Cardiology) Rennie Cindy SAUNDERS, MD as Consulting Physician (Internal Medicine) Aundria Ladell POUR, MD as Consulting Physician (Gastroenterology) Cindie Ole DASEN, MD as Consulting Physician (Cardiology)   Cancer Staging  Primary malignant neoplasm of right lower lobe of lung [facility]) Staging form: Lung, AJCC 7th Edition - Clinical: No stage assigned - Unsigned    Oncology History Overview Note  # DEC 2017- Adeno ca [GATA; her 2 Neu-NEG]; signet ring [1.5 x3 mm gastric incisura; Dr.Skulskie]; EUS [Dr.Burnbridge]; no significant abnormality noted;  Reviewed at [facility] also. JAN 2018- PET NED. April 2018- S/p partial gastrectomy [Dr.Sankar]- STAGE I ADENO CA; NO adjuvant therapy.  # STAGE I CARCINOID s/p partial gastrectomy   # May 2018- Chronic Atrophic gastritis- Prevpac   # FEB 2017- ADENOCARCINOMA with Lepidic 80%-20% acinar pattern; pT2a [Stage IB;T-2.3cm; visceral pleural invasion present; pN=0 ]; AUG 2017- CT NED;   # DEC 2019- RECURRENT/STAGE IV ADENO LUNG CA- EGFR MUTATED;# Jan 6th 2020-; START Osidemrtinib;  # AUG 2020- SEVERE AS [s/p TAVR; [place]]-complicated by R sided stroke/ seizures/acute renal failure.   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 [omniseq]  # Palliative: O-1/20   DIAGNOSIS:  #ADENO CA LUNG-STAGE IV #Stomach adeno ca [stage I; dec 2017]  GOALS: pallaitive  CURRENT/MOST RECENT THERAPY - OSIMERTINIB  [Jan 6th 2020]      Primary malignant neoplasm of right lower lobe of lung (HCC)  12/02/2015 Initial Diagnosis   Primary malignant neoplasm of right lower lobe of lung (HCC)   02/24/2024 -  Chemotherapy   Patient is on Treatment Plan : lung cancer  Datopotamab Deruxtecan-dlnk (Datroway) q21d     Adenocarcinoma of gastric cardia  (HCC)   INTERVAL HISTORY: Patient with her son.  She is in a wheelchair.  Stacy Cook 88 y.o.  female pleasant patient above history of recurrent/stage IV adenocarcinoma lung/EGFR mutated currently on osimertinib ; ITP on promacta ; A.fib on eliquis  is here for follow-up-in review results of the PET scan.   Discussed the use of AI scribe software for clinical note transcription with the patient, who gave verbal consent to proceed.  History of Present Illness   Stacy Cook is an 88 year old female with lung cancer who presents for follow-up. She is accompanied by her son, Stacy Cook.  She has a history of lung cancer and reports that a recent PET scan showed that the cancer may be getting worse and that there is some fluid building up in her abdomen, which she believes is affecting her appetite. She has been on a chemotherapy pill since 2019.  She recently experienced a fall while reaching for something on the floor, resulting in an 11-day emergency room stay followed by 11 days in rehabilitation. She has lost six pounds and is concerned about her weight loss, noting that 'everybody fusses' at her about it.  She has difficulty with liquids, which cause excessive mucus production and frequent spitting, though no nausea is associated with this. Solid foods do not cause swallowing issues, and Coke is the only liquid that does not exacerbate the problem.  She has a history of acid reflux, previously treated with Nexium and Prilosec during a hospital stay, but is not currently on medication for reflux and does not experience significant symptoms now.  She expresses  emotional distress about her health, mentioning dreams about her mortality and feeling frightened. She has a history of multiple health issues, including heart and kidney problems, and has undergone cancer treatment three times. She feels 'kind of useless' and occasionally touched by depression, though she tries to remain  optimistic.      Review of Systems  Constitutional:  Positive for malaise/fatigue. Negative for chills, diaphoresis and fever.  HENT:  Negative for nosebleeds and sore throat.   Eyes:  Negative for double vision.  Respiratory:  Negative for hemoptysis, sputum production and wheezing.   Cardiovascular:  Negative for chest pain, palpitations, orthopnea and leg swelling.  Gastrointestinal:  Negative for blood in stool, constipation, melena, nausea and vomiting.  Genitourinary:  Negative for dysuria, frequency and urgency.  Musculoskeletal:  Positive for back pain and joint pain.  Neurological:  Positive for focal weakness. Negative for dizziness, tingling, weakness and headaches.  Endo/Heme/Allergies:  Bruises/bleeds easily.  Psychiatric/Behavioral:  Negative for depression. The patient is not nervous/anxious and does not have insomnia.       PAST MEDICAL HISTORY :  Past Medical History:  Diagnosis Date   Abdominal pain    Acid reflux 03/24/2015   Acquired iron  deficiency anemia due to decreased absorption 09/09/2017   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   Allergic genetic state    Anemia    Anxiety    Arteriosclerosis of coronary artery 03/24/2015   Arthritis    back, hands (10/15/2017)   Asthma    Atherosclerotic heart disease of native coronary artery without angina pectoris 01/22/2019   B12 deficiency anemia    Basal cell carcinoma 01/01/2022   glabella, [facility]   Basal cell carcinoma 05/03/2022   R ant deltoid, [facility]   Basal cell carcinoma 05/03/2022   Xyphoid excised 06/12/2022   Bile reflux gastritis    CAD (coronary artery disease)    Cancer of right lung (HCC) 05/09/2015   Dr. Volney performed Right lower lobe lobectomy.    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 08/03/2016   Cardiac asystole (HCC) 12/09/2018   Carotid stenosis 02/07/2016   Cataract cortical, senile    Chest pain    Colon polyp    Degeneration of intervertebral disc of lumbar region 03/11/2014    Diabetes (HCC)    Embolic stroke (HCC) 12/05/2018   Gastric adenocarcinoma (HCC)    GERD (gastroesophageal reflux disease)    also, history of ulcers   Heart block 12/10/2018   History of aortic valve stenosis 09/04/2013   History of cancer of stomach 04/11/2016   History of kidney stones    History of lung cancer 06/06/2015   History of stroke    HLD (hyperlipidemia) 08/24/2013   Hypertension    Malignant tumor of stomach (HCC) 07/2016   Adenocarcinoma, diffuse, poorly differentiated, signet ring, stage I   Neuritis or radiculitis due to rupture of lumbar intervertebral disc 09/10/2014   Neuroendocrine tumor (HCC) 03/24/2015   Neuroma    Osteoporosis    Pacemaker 03/19/2019   Peripheral vascular disease    PONV (postoperative nausea and vomiting)    Presence of permanent cardiac pacemaker 12/09/2018   Primary malignant neoplasm of right lower lobe of lung (HCC) 12/02/2015   Renal insufficiency    Renal stone    Right hemiparesis (HCC) 01/22/2019   S/P TAVR (transcatheter aortic valve replacement)    Sciatica    Senile purpura 10/02/2019   Severe aortic stenosis    Skin cancer    cut/burned  LUE; cut off right eye/nose & cut off chest (10/15/2017)   Stomach ulcer    Thrombocytopenia    Type 2 diabetes, diet controlled (HCC)    no RX since stomach OR 07/2016 (10/15/2017)    PAST SURGICAL HISTORY :   Past Surgical History:  Procedure Laterality Date   APPENDECTOMY     CARDIAC CATHETERIZATION  X2 before 10/15/2017   CARDIAC VALVE REPLACEMENT     CATARACT EXTRACTION W/PHACO Left 04/04/2017   Procedure: CATARACT EXTRACTION PHACO AND INTRAOCULAR LENS PLACEMENT (IOC);  Surgeon: Mittie Gaskin, MD;  Location: [facility] ORS;  Service: Ophthalmology;  Laterality: Left;  Lot # [id number] H US  1:00 Ap 25% CDE 8.54   CATARACT EXTRACTION W/PHACO Right 05/15/2017   Procedure: CATARACT EXTRACTION PHACO AND INTRAOCULAR LENS PLACEMENT (IOC);  Surgeon: Mittie Gaskin, MD;  Location:  [facility] ORS;  Service: Ophthalmology;  Laterality: Right;  US  01:10 AP% 18.3 CDE 12.91 Fluid pack lot # [id number]   COLONOSCOPY     CORONARY ATHERECTOMY N/A 10/15/2017   Procedure: CORONARY ATHERECTOMY;  Surgeon: Verlin Lonni BIRCH, MD;  Location: [facility];  Service: Cardiovascular;  Laterality: N/A;   CORONARY STENT INTERVENTION N/A 10/15/2017   Procedure: CORONARY STENT INTERVENTION;  Surgeon: Verlin Lonni BIRCH, MD;  Location: [facility];  Service: Cardiovascular;  Laterality: N/A;   CYSTOSCOPY/URETEROSCOPY/HOLMIUM LASER/STENT PLACEMENT Left 08/03/2019   Procedure: CYSTOSCOPY/URETEROSCOPY/HOLMIUM LASER/STENT PLACEMENT;  Surgeon: Penne Knee, MD;  Location: [facility] ORS;  Service: Urology;  Laterality: Left;   ESOPHAGOGASTRODUODENOSCOPY     ESOPHAGOGASTRODUODENOSCOPY (EGD) WITH PROPOFOL  N/A 03/27/2016   Procedure: ESOPHAGOGASTRODUODENOSCOPY (EGD) WITH PROPOFOL ;  Surgeon: Gladis RAYMOND Mariner, MD;  Location: [facility] ENDOSCOPY;  Service: Endoscopy;  Laterality: N/A;   ESOPHAGOGASTRODUODENOSCOPY (EGD) WITH PROPOFOL  N/A 05/28/2016   Procedure: ESOPHAGOGASTRODUODENOSCOPY (EGD) WITH PROPOFOL ;  Surgeon: Gladis RAYMOND Mariner, MD;  Location: [facility] ENDOSCOPY;  Service: Endoscopy;  Laterality: N/A;   ESOPHAGOGASTRODUODENOSCOPY (EGD) WITH PROPOFOL  N/A 09/25/2016   Procedure: ESOPHAGOGASTRODUODENOSCOPY (EGD) WITH PROPOFOL ;  Surgeon: Dellie Louanne MATSU, MD;  Location: [facility] ENDOSCOPY;  Service: Endoscopy;  Laterality: N/A;   ESOPHAGOGASTRODUODENOSCOPY (EGD) WITH PROPOFOL  N/A 12/18/2016   Procedure: ESOPHAGOGASTRODUODENOSCOPY (EGD) WITH PROPOFOL ;  Surgeon: Dellie Louanne MATSU, MD;  Location: [facility] ENDOSCOPY;  Service: Endoscopy;  Laterality: N/A;   ESOPHAGOGASTRODUODENOSCOPY (EGD) WITH PROPOFOL  N/A 01/23/2017   Procedure: ESOPHAGOGASTRODUODENOSCOPY (EGD) WITH PROPOFOL ;  Surgeon: Dellie Louanne MATSU, MD;  Location: [facility] ENDOSCOPY;  Service: Endoscopy;  Laterality: N/A;    ESOPHAGOGASTRODUODENOSCOPY (EGD) WITH PROPOFOL  N/A 03/20/2017   Procedure: ESOPHAGOGASTRODUODENOSCOPY (EGD) WITH PROPOFOL ;  Surgeon: Dellie Louanne MATSU, MD;  Location: [facility] ENDOSCOPY;  Service: Endoscopy;  Laterality: N/A;   ESOPHAGOGASTRODUODENOSCOPY (EGD) WITH PROPOFOL  N/A 09/23/2020   Procedure: ESOPHAGOGASTRODUODENOSCOPY (EGD) WITH PROPOFOL ;  Surgeon: Maryruth Ole DASEN, MD;  Location: [facility] ENDOSCOPY;  Service: Endoscopy;  Laterality: N/A;  [facility] SAYS AMPICILLIN IS NEEDED   FRACTURE SURGERY     INSERT / REPLACE / REMOVE PACEMAKER  12/09/2018   PACEMAKER LEADLESS INSERTION N/A 12/09/2018   Procedure: PACEMAKER LEADLESS INSERTION;  Surgeon: Kelsie Agent, MD;  [facility];  Service: Cardiovascular;  Laterality: N/A;   PARTIAL GASTRECTOMY N/A 08/03/2016   Hemigastrectomy, Billroth I reconstruction Surgeon: Louanne MATSU Dellie, MD;  Location: [facility] ORS;  Service: General;  Laterality: N/A;   RIGHT/LEFT HEART CATH AND CORONARY ANGIOGRAPHY Bilateral 09/19/2017   Procedure: RIGHT/LEFT HEART CATH AND CORONARY ANGIOGRAPHY;  Surgeon: Florencio Cara BIRCH, MD;  Location: [facility] INVASIVE CV LAB;  Service: Cardiovascular;  Laterality: Bilateral;   SHOULDER ARTHROSCOPY W/ CAPSULAR  REPAIR Right    SKIN CANCER EXCISION     cut/burned LUE; cut off right eye/nose & cut off chest (10/15/2017)   TEE WITHOUT CARDIOVERSION N/A 12/02/2018   Procedure: TRANSESOPHAGEAL ECHOCARDIOGRAM (TEE);  Surgeon: Verlin Lonni BIRCH, MD;  Location: [facility] INVASIVE CV LAB;  Service: Open Heart Surgery;  Laterality: N/A;   THORACOTOMY Right 05/09/2015   Procedure: THORACOTOMY, RIGHT LOWER LOBECTOMY, BRONCHOSCOPY;  Surgeon: Evalene Glasser, MD;  Location: [facility] ORS;  Service: Thoracic;  Laterality: Right;   TONSILLECTOMY  1944   TRANSCATHETER AORTIC VALVE REPLACEMENT, TRANSFEMORAL N/A 12/02/2018   Procedure: TRANSCATHETER AORTIC VALVE REPLACEMENT, TRANSFEMORAL;  Surgeon: Verlin Lonni BIRCH, MD;  Location: [facility]  INVASIVE CV LAB;  Service: Open Heart Surgery;  Laterality: N/A;   TUBAL LIGATION     UPPER GI ENDOSCOPY N/A 08/03/2016   Procedure: UPPER  ENDOSCOPY;  Surgeon: Louanne KANDICE Muse, MD;  Location: [facility] ORS;  Service: General;  Laterality: N/A;   VAGINAL HYSTERECTOMY     WRIST FRACTURE SURGERY Right     FAMILY HISTORY :   Family History  Problem Relation Age of Onset   Aortic aneurysm Mother    Kidney Stones Mother    Hypertension Mother    Hypertension Father    Diabetes Other    Breast cancer Neg Hx    Prostate cancer Neg Hx    Bladder Cancer Neg Hx    Kidney cancer Neg Hx     SOCIAL HISTORY:   Social History   Tobacco Use   Smoking status: Never   Smokeless tobacco: Never  Vaping Use   Vaping status: Never Used  Substance Use Topics   Alcohol use: Not Currently   Drug use: Never    ALLERGIES:  is allergic to mirtazapine, pneumococcal vaccine, statins, and sulfa antibiotics.  MEDICATIONS:  Current Outpatient Medications  Medication Sig Dispense Refill   apixaban  (ELIQUIS ) 2.5 MG TABS tablet Take 1 tablet (2.5 mg total) by mouth 2 (two) times daily. 180 tablet 1   cholecalciferol  (VITAMIN D ) 1000 UNITS tablet Take 1,000 Units by mouth daily.     cyanocobalamin  1000 MCG tablet Take 1,000 mcg by mouth daily.      eltrombopag  (PROMACTA ) 25 MG tablet Take 1 tablet (25 mg total) by mouth daily. Take on an empty stomach, 1 hour before a meal or 2 hours after. 30 tablet 2   ezetimibe  (ZETIA ) 10 MG tablet Take 1 tablet (10 mg total) by mouth daily. 90 tablet 3   ferrous sulfate  325 (65 FE) MG tablet Take 325 mg by mouth daily with breakfast.     ipratropium (ATROVENT) 0.06 % nasal spray Place 2 sprays into both nostrils 2 (two) times daily.     isosorbide  mononitrate (IMDUR ) 30 MG 24 hr tablet Take 1 tablet (30 mg total) by mouth daily. 90 tablet 3   Magnesium  100 MG TABS Take 1 tablet by mouth daily at 6 (six) AM.     metoprolol  succinate (TOPROL -XL) 50 MG 24 hr tablet  Take 1 tablet (50 mg total) by mouth every evening. Take with or immediately following a meal. 30 tablet 0   osimertinib  mesylate (TAGRISSO ) 80 MG tablet Take 1 tablet (80 mg total) by mouth daily. 30 tablet 1   potassium citrate  (UROCIT-K ) 10 MEQ (1080 MG) SR tablet Take 1 tablet by mouth once daily 90 tablet 0   No current facility-administered medications for this visit.    PHYSICAL EXAMINATION: ECOG PERFORMANCE STATUS: 0 - Asymptomatic  BP 125/62 (BP  Location: Left Arm, Patient Position: Sitting, Cuff Size: Normal)   Pulse 76   Temp (!) 97.2 F (36.2 C) (Tympanic)   Resp 18   Ht 5' 1 (1.549 m)   Wt 93 lb 11.2 oz (42.5 kg)   SpO2 99%   BMI 17.70 kg/m   Filed Weights   02/21/24 0956  Weight: 93 lb 11.2 oz (42.5 kg)       Decreased breath sounds on the right side compared to left.  Physical Exam Constitutional:      Comments: Patient walking by herself; no assistive devices.  Alone.  HENT:     Head: Normocephalic and atraumatic.     Mouth/Throat:     Pharynx: No oropharyngeal exudate.  Eyes:     Pupils: Pupils are equal, round, and reactive to light.  Cardiovascular:     Rate and Rhythm: Normal rate and regular rhythm.     Heart sounds: Murmur heard.  Pulmonary:     Effort: No respiratory distress.     Breath sounds: No wheezing.  Abdominal:     General: Bowel sounds are normal. There is no distension.     Palpations: Abdomen is soft. There is no mass.     Tenderness: There is no abdominal tenderness. There is no guarding or rebound.  Musculoskeletal:        General: No tenderness. Normal range of motion.     Cervical back: Normal range of motion and neck supple.  Skin:    General: Skin is warm.  Neurological:     Mental Status: She is alert and oriented to person, place, and time.     Comments: Weakness of the right upper extremity more than lower extremity.  Psychiatric:        Mood and Affect: Affect normal.    LABORATORY DATA:  I have reviewed the  data as listed    Component Value Date/Time   NA 141 02/21/2024 0954   NA 141 02/05/2022 1405   NA 141 07/21/2012 1529   K 3.5 02/21/2024 0954   K 3.9 07/21/2012 1529   CL 110 02/21/2024 0954   CL 109 (H) 07/21/2012 1529   CO2 23 02/21/2024 0954   CO2 27 07/21/2012 1529   GLUCOSE 94 02/21/2024 0954   GLUCOSE 104 (H) 07/21/2012 1529   BUN 29 (H) 02/21/2024 0954   BUN 34 (H) 02/05/2022 1405   BUN 16 07/21/2012 1529   CREATININE 1.16 (H) 02/21/2024 0954   CREATININE 1.11 09/03/2012 1535   CALCIUM  8.9 02/21/2024 0954   CALCIUM  8.9 07/21/2012 1529   PROT 6.5 02/21/2024 0954   PROT 7.1 07/21/2012 1529   ALBUMIN 3.5 02/21/2024 0954   ALBUMIN 3.9 07/21/2012 1529   AST 18 02/21/2024 0954   ALT 9 02/21/2024 0954   ALT 22 07/21/2012 1529   ALKPHOS 93 02/21/2024 0954   ALKPHOS 76 07/21/2012 1529   BILITOT 0.8 02/21/2024 0954   GFRNONAA 46 (L) 02/21/2024 0954   GFRNONAA 49 (L) 09/03/2012 1535   GFRAA 29 (L) 01/05/2020 1440   GFRAA 57 (L) 09/03/2012 1535    No results found for: SPEP, UPEP  Lab Results  Component Value Date   WBC 4.8 02/21/2024   NEUTROABS 3.7 02/21/2024   HGB 11.3 (L) 02/21/2024   HCT 35.0 (L) 02/21/2024   MCV 94.3 02/21/2024   PLT 87 (L) 02/21/2024      Chemistry      Component Value Date/Time   NA 141 02/21/2024 0954  NA 141 02/05/2022 1405   NA 141 07/21/2012 1529   K 3.5 02/21/2024 0954   K 3.9 07/21/2012 1529   CL 110 02/21/2024 0954   CL 109 (H) 07/21/2012 1529   CO2 23 02/21/2024 0954   CO2 27 07/21/2012 1529   BUN 29 (H) 02/21/2024 0954   BUN 34 (H) 02/05/2022 1405   BUN 16 07/21/2012 1529   CREATININE 1.16 (H) 02/21/2024 0954   CREATININE 1.11 09/03/2012 1535      Component Value Date/Time   CALCIUM  8.9 02/21/2024 0954   CALCIUM  8.9 07/21/2012 1529   ALKPHOS 93 02/21/2024 0954   ALKPHOS 76 07/21/2012 1529   AST 18 02/21/2024 0954   ALT 9 02/21/2024 0954   ALT 22 07/21/2012 1529   BILITOT 0.8 02/21/2024 0954        RADIOGRAPHIC STUDIES: I have personally reviewed the radiological images as listed and agreed with the findings in the report. No results found.   ASSESSMENT & PLAN:  Primary malignant neoplasm of right lower lobe of lung (HCC) # Stage IV recurrent adenocarcinoma the lung; EGFR mutated. On osimertinib  80 mg [Feb 6th 2020] #  currently on Osimertinib . PET scan OCT 2025-Disease progression as evidenced by a new hypermetabolic dominant right middle lobe nodule with additional subpleural hypermetabolic nodules in the right hemithorax. 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  # I discussed my concerns for progressive disease based on imaging; and symptomatology.  Discussed that median PFS of osimertinib  is around 16 to 18 months; patient is has been on it since 5-1/2 years.  Unfortunately, progression of disease expected on therapies.  # Discussed the importance of confirming the recurrent disease; and also the importance of NGS testing for further therapy options.  Given patient's medical problems/patient is a borderline candidate for chemotherapy.  Ordered blood NGS-[facility]/today.  Patient reluctant with bronchoscopy-which is planned on October 21.  Will discuss with Dr. DELENA.  #Right pleural effusion-status postthoracentesis-pleural fluid: Transudative; cytology negative for malignancy.  Question CHF/fluid overload.  S/p Lasix  20 mg a day- improved/stable. Currently not on any lasix  [[facility]]- stable.   # reflux- reocmmend nexium/prilosec OTC.   # Chronic anemia- Hb 10-18 Dec 2021- Iron  sat- 22%; ferritin- 137- continue Iron  pills once day. Hold off Venofer - STABLE.   # ITP- chronic- platelets> 100 on promacta  25 mg/day- STABLE.   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 STABLE.   #  A-fib [feb 2024] on Eliquis  2.5/cardiology. STABLE.   # right sided stroke/ seizures-on asprin; off plavix ;; f/u neurology-[place]-  STABLE.   # CKD- stage IV; Left Hydronephrosis s/p ureteral stent placement/nephrolithiasis-~ GFR 34 today; Hypokalemia-3.4  continue K-citrate q day.STABLE.   # IV ACCESS: PIV  # I had a long discussion the patient regarding the overall difficult situation where she has progressive disease in the context of multiple medical problems and frailty.  Patient wants to fight; and is willing to proceed with treatments. Will also recommend palliative care evaluatoin at next visit. Will need chemo education for dato deruxtecan.   # DISPOSITION: # [facility] blood test today-  # follow up in 2-3 weeks-  MD; labs- cbc/cmp- Dr.B  # 40 minutes face-to-face with the patient discussing the above plan of care; more than 50% of time spent on prognosis/ natural history; counseling and coordination.      Orders  Placed This Encounter  Procedures   CBC with Differential (Cancer Center Only)    Standing Status:   Future    Expected Date:   03/13/2024    Expiration Date:   02/20/2025   CMP (Cancer Center only)    Standing Status:   Future    Expected Date:   03/13/2024    Expiration Date:   02/20/2025   All questions were answered. The patient knows to call the clinic with any problems, questions or concerns.      Cindy JONELLE Joe, MD 02/23/2024 10:39 PM

## 2024-02-23 ENCOUNTER — Encounter: Payer: Self-pay | Admitting: Internal Medicine

## 2024-02-23 NOTE — Progress Notes (Signed)
 START ON PATHWAY REGIMEN - Non-Small Cell Lung     A cycle is every 21 days:     Datopotamab deruxtecan-dlnk   **Always confirm dose/schedule in your pharmacy ordering system**  Patient Characteristics: Stage IV Metastatic, Nonsquamous, Molecular Analysis Completed, Molecular Alteration Present and Eligible for Molecular Targeted Therapy, Second Line - Molecular Targeted Therapy, EGFR Mutation - Common (Exon 19 Deletion or Exon 21 L858R Substitution) Therapeutic Status: Stage IV Metastatic Histology: Nonsquamous Cell Broad Molecular Profiling Status: Molecular Analysis Completed Molecular Analysis Results: Alteration Present and Eligible for Molecular Targeted Therapy Molecular Alteration Present: EGFR Mutation - Common (Exon 19 Deletion or Exon 21 L858R Substitution) Molecular Targeted Line of Therapy: Second Scientist, Forensic Therapy Intent of Therapy: Non-Curative / Palliative Intent, Discussed with Patient

## 2024-02-24 ENCOUNTER — Ambulatory Visit
Admission: RE | Admit: 2024-02-24 | Discharge: 2024-02-24 | Disposition: A | Discharge status: Home / Self Care | Source: Ambulatory Visit | Attending: Pulmonary Disease | Admitting: Pulmonary Disease

## 2024-02-24 ENCOUNTER — Other Ambulatory Visit: Payer: Self-pay | Admitting: Pulmonary Disease

## 2024-02-24 ENCOUNTER — Other Ambulatory Visit: Payer: Self-pay | Admitting: Internal Medicine

## 2024-02-24 DIAGNOSIS — R911 Solitary pulmonary nodule: Secondary | ICD-10-CM

## 2024-02-24 DIAGNOSIS — I110 Hypertensive heart disease with heart failure: Secondary | ICD-10-CM | POA: Diagnosis not present

## 2024-02-24 DIAGNOSIS — E1151 Type 2 diabetes mellitus with diabetic peripheral angiopathy without gangrene: Secondary | ICD-10-CM | POA: Diagnosis not present

## 2024-02-24 DIAGNOSIS — N184 Chronic kidney disease, stage 4 (severe): Secondary | ICD-10-CM | POA: Diagnosis not present

## 2024-02-24 DIAGNOSIS — E1122 Type 2 diabetes mellitus with diabetic chronic kidney disease: Secondary | ICD-10-CM | POA: Diagnosis not present

## 2024-02-24 DIAGNOSIS — D631 Anemia in chronic kidney disease: Secondary | ICD-10-CM | POA: Diagnosis not present

## 2024-02-24 DIAGNOSIS — R590 Localized enlarged lymph nodes: Secondary | ICD-10-CM | POA: Diagnosis not present

## 2024-02-24 DIAGNOSIS — I5032 Chronic diastolic (congestive) heart failure: Secondary | ICD-10-CM | POA: Diagnosis not present

## 2024-02-24 DIAGNOSIS — C3431 Malignant neoplasm of lower lobe, right bronchus or lung: Secondary | ICD-10-CM | POA: Insufficient documentation

## 2024-02-24 DIAGNOSIS — I2510 Atherosclerotic heart disease of native coronary artery without angina pectoris: Secondary | ICD-10-CM | POA: Diagnosis not present

## 2024-02-24 DIAGNOSIS — S76091D Other specified injury of muscle, fascia and tendon of right hip, subsequent encounter: Secondary | ICD-10-CM | POA: Diagnosis not present

## 2024-02-24 LAB — MISCELLANEOUS TEST

## 2024-02-24 NOTE — Telephone Encounter (Signed)
 Office calling to f/u on clearance and med hold for patient. Patients procedure is scheduled for 11/21. Please advise.   Surgeon's Group or Practice Name:  [facility]  Phone number:  [phone] Fax number:  [phone]

## 2024-02-24 NOTE — Telephone Encounter (Signed)
 Dr. Cindie,  Please review pharmacy recommendations and advise.  Thank you for your help.  Please direct your response to CVD IV preop pool.  Josefa HERO. Athalie Newhard NP-C     02/24/2024, 8:52 AM [facility] [address] [place], [place] [address] Office [phone]

## 2024-02-24 NOTE — Telephone Encounter (Signed)
 I will forward this to the preop APP to review. Looks like we are waiting on recommendations from  Dr. Cindie. Also though the pt has appt in office 03/03/24 Charlies Cook, [facility] and procedure 02/28/24. As FYI this will incur 2 visits so close together billed to pt's insurance.   Will review with preop APP for further recommendations.

## 2024-02-25 ENCOUNTER — Encounter: Payer: Self-pay | Admitting: Urgent Care

## 2024-02-25 ENCOUNTER — Ambulatory Visit (INDEPENDENT_AMBULATORY_CARE_PROVIDER_SITE_OTHER): Payer: Medicare PPO | Admitting: Vascular Surgery

## 2024-02-25 ENCOUNTER — Encounter (INDEPENDENT_AMBULATORY_CARE_PROVIDER_SITE_OTHER): Payer: Medicare PPO

## 2024-02-25 ENCOUNTER — Encounter: Payer: Self-pay | Admitting: Pulmonary Disease

## 2024-02-25 NOTE — Progress Notes (Signed)
 Patient s/p CT chest with findings of bilateral moderate pleural effusions.  PET positive scan recently.  Will order thoracentesis with cytology and microbiology.     Prentice Picking, MD Minimally Invasive [facility]  Division of Pulmonary & Critical Care Medicine

## 2024-02-25 NOTE — Telephone Encounter (Signed)
 Will forward back to preop and to requesting office.   Preop APP has reached out to Dr. Cindie.

## 2024-02-25 NOTE — Telephone Encounter (Signed)
 Office call back for update but got disconnected while looking for update. Please advise

## 2024-02-26 NOTE — Telephone Encounter (Signed)
   Name: Stacy Cook  DOB: 1935/09/08  MRN: [id number]  Primary Cardiologist: None  Chart reviewed as part of pre-operative protocol coverage. The patient has an upcoming visit scheduled with Charlies Riling, PA on 03/03/2024 at which time clearance can be addressed in case there are any issues that would impact surgical recommendations. Surgery should be postponed until office visit. (Scheduled on 02/28/2024)/  Per Dr. Cindie would prefer the risks/benefits for NOAC interruption be discussed in person with Renee at their upcoming appointment. I would not recommend bridging but given her CHADSVASc this should be discussed and documented appropriately.  I will send this note to surgeon's office.   I will route this message as FYI to requesting party and remove this message from the preop box as separate preop APP input not needed at this time.   Please call with any questions.  Lamarr Satterfield, NP  02/26/2024, 9:56 AM

## 2024-02-27 ENCOUNTER — Other Ambulatory Visit: Payer: Self-pay

## 2024-02-27 ENCOUNTER — Other Ambulatory Visit: Payer: Self-pay | Admitting: Pulmonary Disease

## 2024-02-27 ENCOUNTER — Other Ambulatory Visit (HOSPITAL_COMMUNITY): Payer: Self-pay

## 2024-02-27 DIAGNOSIS — C3431 Malignant neoplasm of lower lobe, right bronchus or lung: Secondary | ICD-10-CM

## 2024-02-27 DIAGNOSIS — J90 Pleural effusion, not elsewhere classified: Secondary | ICD-10-CM

## 2024-02-27 NOTE — Progress Notes (Signed)
 Specialty Pharmacy Refill Coordination Note  Stacy Cook is a 88 y.o. female contacted today regarding refills of specialty medication(s) Eltrombopag  Olamine (PROMACTA ); Osimertinib  Mesylate (TAGRISSO )   Patient requested (Patient-Rptd) Delivery   Delivery date: 03/02/24   Verified address: (Patient-Rptd) [address].     [place], [place]   Medication will be filled on: 02/28/24

## 2024-02-28 ENCOUNTER — Ambulatory Visit: Admit: 2024-02-28 | Admitting: Pulmonary Disease

## 2024-02-28 ENCOUNTER — Other Ambulatory Visit: Payer: Self-pay | Admitting: *Deleted

## 2024-02-28 HISTORY — DX: Chronic kidney disease, stage 3 unspecified: N18.30

## 2024-02-28 HISTORY — DX: Unspecified atrial fibrillation: I48.91

## 2024-02-28 HISTORY — DX: Long term (current) use of anticoagulants: Z79.01

## 2024-02-28 HISTORY — DX: Pulmonary hypertension, unspecified: I27.20

## 2024-02-28 HISTORY — DX: Unspecified diastolic (congestive) heart failure: I50.30

## 2024-02-28 HISTORY — DX: Iron deficiency anemia, unspecified: D50.9

## 2024-02-28 SURGERY — VIDEO BRONCHOSCOPY WITH ENDOBRONCHIAL NAVIGATION
Anesthesia: General

## 2024-03-02 ENCOUNTER — Ambulatory Visit
Admission: RE | Admit: 2024-03-02 | Discharge: 2024-03-02 | Disposition: A | Discharge status: Home / Self Care | Source: Ambulatory Visit | Attending: Urology | Admitting: Urology

## 2024-03-02 ENCOUNTER — Other Ambulatory Visit: Payer: Self-pay | Admitting: Urology

## 2024-03-02 ENCOUNTER — Ambulatory Visit
Admission: RE | Admit: 2024-03-02 | Discharge: 2024-03-02 | Disposition: A | Discharge status: Home / Self Care | Source: Ambulatory Visit | Attending: Pulmonary Disease | Admitting: Pulmonary Disease

## 2024-03-02 DIAGNOSIS — Z9889 Other specified postprocedural states: Secondary | ICD-10-CM | POA: Insufficient documentation

## 2024-03-02 DIAGNOSIS — Z85118 Personal history of other malignant neoplasm of bronchus and lung: Secondary | ICD-10-CM | POA: Diagnosis not present

## 2024-03-02 DIAGNOSIS — J90 Pleural effusion, not elsewhere classified: Secondary | ICD-10-CM

## 2024-03-02 DIAGNOSIS — I509 Heart failure, unspecified: Secondary | ICD-10-CM | POA: Insufficient documentation

## 2024-03-02 DIAGNOSIS — Z902 Acquired absence of lung [part of]: Secondary | ICD-10-CM | POA: Diagnosis not present

## 2024-03-02 DIAGNOSIS — J948 Other specified pleural conditions: Secondary | ICD-10-CM | POA: Insufficient documentation

## 2024-03-02 LAB — BODY FLUID CELL COUNT WITH DIFFERENTIAL: Total Nucleated Cell Count, Fluid: 0 uL

## 2024-03-02 LAB — GLUCOSE, PLEURAL OR PERITONEAL FLUID: Glucose, Fluid: 38 mg/dL

## 2024-03-02 LAB — LACTATE DEHYDROGENASE, PLEURAL OR PERITONEAL FLUID: LD, Fluid: 697 U/L — ABNORMAL HIGH (ref 3–23)

## 2024-03-02 MED ORDER — LIDOCAINE HCL (PF) 1 % IJ SOLN
10.0000 mL | Freq: Once | INTRAMUSCULAR | Status: AC
  Administered 2024-03-02: 10 mL via INTRADERMAL
  Filled 2024-03-02: qty 10

## 2024-03-02 NOTE — Procedures (Signed)
 PROCEDURE SUMMARY:  Successful image-guided right thoracentesis. Yielded 0.20 liters of initially brown and turbid pleural fluid.  The fluid then appeared to drain several loculation pockets, with clear serous to blood-laden pleural fluid removed, with intermittent air.  Patient tolerated procedure poorly due to right shoulder pain and associated chest wall pain. The procedure was aborted shortly after fluid draining began due to notable patient discomfort. EBL: Zero No immediate complications.  Specimen was sent for labs. Post procedure CXR shows no pneumothorax.  Please see imaging section of Epic for full dictation.  Carlin DELENA Griffon PA-C 03/02/2024 12:35 PM

## 2024-03-02 NOTE — Progress Notes (Signed)
 Pharmacist Chemotherapy Monitoring - Initial Assessment    Anticipated start date: 03/16/24   The following has been reviewed per standard work regarding the patient's treatment regimen: The patient's diagnosis, treatment plan and drug doses, and organ/hematologic function Lab orders and baseline tests specific to treatment regimen  The treatment plan start date, drug sequencing, and pre-medications Prior authorization status  Patient's documented medication list, including drug-drug interaction screen and prescriptions for anti-emetics and supportive care specific to the treatment regimen The drug concentrations, fluid compatibility, administration routes, and timing of the medications to be used The patient's access for treatment and lifetime cumulative dose history, if applicable  The patient's medication allergies and previous infusion related reactions, if applicable   Changes made to treatment plan:  N/A  Follow up needed:  Pre-treatment completed/releasing of home medications    Stacy Cook, [facility], 03/02/2024  3:41 PM

## 2024-03-03 ENCOUNTER — Ambulatory Visit: Admitting: Physician Assistant

## 2024-03-04 ENCOUNTER — Encounter: Payer: Self-pay | Admitting: Internal Medicine

## 2024-03-04 LAB — CYTOLOGY - NON PAP

## 2024-03-06 LAB — COMP PANEL: LEUKEMIA/LYMPHOMA

## 2024-03-09 ENCOUNTER — Encounter: Payer: Self-pay | Admitting: Internal Medicine

## 2024-03-09 ENCOUNTER — Inpatient Hospital Stay: Attending: Internal Medicine

## 2024-03-09 DIAGNOSIS — I130 Hypertensive heart and chronic kidney disease with heart failure and stage 1 through stage 4 chronic kidney disease, or unspecified chronic kidney disease: Secondary | ICD-10-CM | POA: Insufficient documentation

## 2024-03-09 DIAGNOSIS — I4891 Unspecified atrial fibrillation: Secondary | ICD-10-CM | POA: Insufficient documentation

## 2024-03-09 DIAGNOSIS — Z8719 Personal history of other diseases of the digestive system: Secondary | ICD-10-CM | POA: Insufficient documentation

## 2024-03-09 DIAGNOSIS — Z7952 Long term (current) use of systemic steroids: Secondary | ICD-10-CM | POA: Insufficient documentation

## 2024-03-09 DIAGNOSIS — E1151 Type 2 diabetes mellitus with diabetic peripheral angiopathy without gangrene: Secondary | ICD-10-CM | POA: Insufficient documentation

## 2024-03-09 DIAGNOSIS — C160 Malignant neoplasm of cardia: Secondary | ICD-10-CM | POA: Insufficient documentation

## 2024-03-09 DIAGNOSIS — I5032 Chronic diastolic (congestive) heart failure: Secondary | ICD-10-CM | POA: Insufficient documentation

## 2024-03-09 DIAGNOSIS — K1230 Oral mucositis (ulcerative), unspecified: Secondary | ICD-10-CM | POA: Insufficient documentation

## 2024-03-09 DIAGNOSIS — I129 Hypertensive chronic kidney disease with stage 1 through stage 4 chronic kidney disease, or unspecified chronic kidney disease: Secondary | ICD-10-CM | POA: Insufficient documentation

## 2024-03-09 DIAGNOSIS — Z87442 Personal history of urinary calculi: Secondary | ICD-10-CM | POA: Insufficient documentation

## 2024-03-09 DIAGNOSIS — E1122 Type 2 diabetes mellitus with diabetic chronic kidney disease: Secondary | ICD-10-CM | POA: Insufficient documentation

## 2024-03-09 DIAGNOSIS — I2510 Atherosclerotic heart disease of native coronary artery without angina pectoris: Secondary | ICD-10-CM | POA: Insufficient documentation

## 2024-03-09 DIAGNOSIS — N183 Chronic kidney disease, stage 3 unspecified: Secondary | ICD-10-CM | POA: Insufficient documentation

## 2024-03-09 DIAGNOSIS — D509 Iron deficiency anemia, unspecified: Secondary | ICD-10-CM | POA: Insufficient documentation

## 2024-03-09 DIAGNOSIS — M810 Age-related osteoporosis without current pathological fracture: Secondary | ICD-10-CM | POA: Insufficient documentation

## 2024-03-09 DIAGNOSIS — Z903 Acquired absence of stomach [part of]: Secondary | ICD-10-CM | POA: Insufficient documentation

## 2024-03-09 DIAGNOSIS — K5900 Constipation, unspecified: Secondary | ICD-10-CM | POA: Insufficient documentation

## 2024-03-09 DIAGNOSIS — R569 Unspecified convulsions: Secondary | ICD-10-CM | POA: Insufficient documentation

## 2024-03-09 DIAGNOSIS — I272 Pulmonary hypertension, unspecified: Secondary | ICD-10-CM | POA: Insufficient documentation

## 2024-03-09 DIAGNOSIS — C3431 Malignant neoplasm of lower lobe, right bronchus or lung: Secondary | ICD-10-CM | POA: Insufficient documentation

## 2024-03-09 DIAGNOSIS — E538 Deficiency of other specified B group vitamins: Secondary | ICD-10-CM | POA: Insufficient documentation

## 2024-03-09 DIAGNOSIS — Z8674 Personal history of sudden cardiac arrest: Secondary | ICD-10-CM | POA: Insufficient documentation

## 2024-03-09 DIAGNOSIS — Z85828 Personal history of other malignant neoplasm of skin: Secondary | ICD-10-CM | POA: Insufficient documentation

## 2024-03-09 DIAGNOSIS — Z5112 Encounter for antineoplastic immunotherapy: Secondary | ICD-10-CM | POA: Insufficient documentation

## 2024-03-09 DIAGNOSIS — D693 Immune thrombocytopenic purpura: Secondary | ICD-10-CM | POA: Insufficient documentation

## 2024-03-09 DIAGNOSIS — K219 Gastro-esophageal reflux disease without esophagitis: Secondary | ICD-10-CM | POA: Insufficient documentation

## 2024-03-09 DIAGNOSIS — E785 Hyperlipidemia, unspecified: Secondary | ICD-10-CM | POA: Insufficient documentation

## 2024-03-09 DIAGNOSIS — Z8673 Personal history of transient ischemic attack (TIA), and cerebral infarction without residual deficits: Secondary | ICD-10-CM | POA: Insufficient documentation

## 2024-03-09 DIAGNOSIS — G4700 Insomnia, unspecified: Secondary | ICD-10-CM | POA: Insufficient documentation

## 2024-03-09 DIAGNOSIS — I350 Nonrheumatic aortic (valve) stenosis: Secondary | ICD-10-CM | POA: Insufficient documentation

## 2024-03-09 DIAGNOSIS — Z66 Do not resuscitate: Secondary | ICD-10-CM | POA: Insufficient documentation

## 2024-03-11 DIAGNOSIS — C4491 Basal cell carcinoma of skin, unspecified: Secondary | ICD-10-CM | POA: Diagnosis not present

## 2024-03-12 ENCOUNTER — Encounter: Payer: Self-pay | Admitting: Internal Medicine

## 2024-03-12 NOTE — Telephone Encounter (Signed)
 Would wait for official request from eye surgeon so we can have documentation of clearance please.

## 2024-03-12 NOTE — Telephone Encounter (Signed)
 I will send back to preop APP to see notes from EP scheduler today and provide if still to proceed with reschedule Charlies Arthur , [facility] appt.

## 2024-03-12 NOTE — Telephone Encounter (Signed)
 I will send message to EP schedulers to reschedule appt with Charlies Arthur, PAC per Dr. Cindie. Pt needs preop clearance.

## 2024-03-12 NOTE — Telephone Encounter (Signed)
 we never got a request from the eye doctor. pt probably should ask the eye dr if they need clearance

## 2024-03-12 NOTE — Telephone Encounter (Signed)
 Spoke w/ patient - patients bronchoscopy has been canceled d/t eye cancer in which she'll need surgery for. She will be getting chemo 12/8 and then following up with the eye surgeon on 12/12. Does patient need preop clearance for the eye surgery, although I understand current clearance is in for the bronchoscopy. She has a lot going on right now, but understands if she needs an appointment.

## 2024-03-12 NOTE — Telephone Encounter (Signed)
 I s/w the pt and asked her if she would be able to reach out to the eye doctor and ask if they will need cardiac clearance. If yes, then they will need to send clearance request. Pt said she has chemo Monday and wanted to know if they put a port in will they need clearance. I said yes and they will need to fax request as well to cardiology. Pt verbalized understanding.

## 2024-03-12 NOTE — Telephone Encounter (Signed)
 Please reschedule appointment with Charlies Riling, PA for cardiac clearance, as she cancelled the pre-op appointment scheduled for 03/03/2024.   Per Dr. Cindie would prefer the risks/benefits for NOAC interruption be discussed in person with Renee at their upcoming appointment. I would not recommend bridging but given her CHADSVASc this should be discussed and documented appropriately.

## 2024-03-13 ENCOUNTER — Other Ambulatory Visit: Payer: Self-pay | Admitting: *Deleted

## 2024-03-13 ENCOUNTER — Telehealth: Payer: Self-pay | Admitting: *Deleted

## 2024-03-13 DIAGNOSIS — C3431 Malignant neoplasm of lower lobe, right bronchus or lung: Secondary | ICD-10-CM

## 2024-03-13 MED ORDER — DEXAMETHASONE 4 MG PO TABS: ORAL_TABLET | ORAL | 1 refills | Status: DC

## 2024-03-13 MED ORDER — PROCHLORPERAZINE MALEATE 10 MG PO TABS: 10.0000 mg | ORAL_TABLET | Freq: Four times a day (QID) | ORAL | 1 refills | Status: DC | PRN

## 2024-03-13 MED ORDER — DEXAMETHASONE 0.5 MG/5ML PO SOLN: ORAL | 4 refills | Status: DC

## 2024-03-13 MED ORDER — CARBOXYMETHYLCELL-GLYCERIN PF 0.5-0.9 % OP SOLN: 2.0000 [drp] | Freq: Four times a day (QID) | OPHTHALMIC | 11 refills | Status: DC

## 2024-03-13 MED ORDER — ONDANSETRON HCL 8 MG PO TABS: 8.0000 mg | ORAL_TABLET | Freq: Three times a day (TID) | ORAL | 1 refills | Status: DC | PRN

## 2024-03-13 NOTE — Telephone Encounter (Signed)
 T/C to patient to inform her that prescriptions have been sent to her pharmacy today. These medications are related to her chemotherapy. Informed her not to start any of them. Dr B will explain these medications on your visit on Monday.

## 2024-03-16 ENCOUNTER — Encounter: Payer: Self-pay | Admitting: Internal Medicine

## 2024-03-16 ENCOUNTER — Inpatient Hospital Stay: Admitting: Internal Medicine

## 2024-03-16 ENCOUNTER — Inpatient Hospital Stay

## 2024-03-16 ENCOUNTER — Inpatient Hospital Stay: Admitting: Hospice and Palliative Medicine

## 2024-03-16 VITALS — BP 156/48 | HR 77 | Temp 98.6°F | Resp 18 | Wt 91.5 lb

## 2024-03-16 VITALS — BP 121/53 | HR 78 | Temp 97.1°F | Resp 19

## 2024-03-16 DIAGNOSIS — Z5112 Encounter for antineoplastic immunotherapy: Secondary | ICD-10-CM | POA: Diagnosis present

## 2024-03-16 DIAGNOSIS — E1122 Type 2 diabetes mellitus with diabetic chronic kidney disease: Secondary | ICD-10-CM | POA: Diagnosis not present

## 2024-03-16 DIAGNOSIS — I272 Pulmonary hypertension, unspecified: Secondary | ICD-10-CM | POA: Diagnosis not present

## 2024-03-16 DIAGNOSIS — R569 Unspecified convulsions: Secondary | ICD-10-CM | POA: Diagnosis not present

## 2024-03-16 DIAGNOSIS — E785 Hyperlipidemia, unspecified: Secondary | ICD-10-CM | POA: Diagnosis not present

## 2024-03-16 DIAGNOSIS — C3431 Malignant neoplasm of lower lobe, right bronchus or lung: Secondary | ICD-10-CM

## 2024-03-16 DIAGNOSIS — I4891 Unspecified atrial fibrillation: Secondary | ICD-10-CM | POA: Diagnosis not present

## 2024-03-16 DIAGNOSIS — N183 Chronic kidney disease, stage 3 unspecified: Secondary | ICD-10-CM | POA: Diagnosis not present

## 2024-03-16 DIAGNOSIS — D693 Immune thrombocytopenic purpura: Secondary | ICD-10-CM | POA: Diagnosis not present

## 2024-03-16 DIAGNOSIS — I129 Hypertensive chronic kidney disease with stage 1 through stage 4 chronic kidney disease, or unspecified chronic kidney disease: Secondary | ICD-10-CM | POA: Diagnosis not present

## 2024-03-16 DIAGNOSIS — C160 Malignant neoplasm of cardia: Secondary | ICD-10-CM | POA: Diagnosis not present

## 2024-03-16 DIAGNOSIS — Z8719 Personal history of other diseases of the digestive system: Secondary | ICD-10-CM | POA: Diagnosis not present

## 2024-03-16 DIAGNOSIS — I5032 Chronic diastolic (congestive) heart failure: Secondary | ICD-10-CM | POA: Diagnosis not present

## 2024-03-16 DIAGNOSIS — Z66 Do not resuscitate: Secondary | ICD-10-CM | POA: Diagnosis not present

## 2024-03-16 DIAGNOSIS — Z903 Acquired absence of stomach [part of]: Secondary | ICD-10-CM | POA: Diagnosis not present

## 2024-03-16 DIAGNOSIS — K1230 Oral mucositis (ulcerative), unspecified: Secondary | ICD-10-CM | POA: Diagnosis not present

## 2024-03-16 DIAGNOSIS — D509 Iron deficiency anemia, unspecified: Secondary | ICD-10-CM | POA: Diagnosis not present

## 2024-03-16 DIAGNOSIS — Z8673 Personal history of transient ischemic attack (TIA), and cerebral infarction without residual deficits: Secondary | ICD-10-CM | POA: Diagnosis not present

## 2024-03-16 DIAGNOSIS — I350 Nonrheumatic aortic (valve) stenosis: Secondary | ICD-10-CM | POA: Diagnosis not present

## 2024-03-16 DIAGNOSIS — I130 Hypertensive heart and chronic kidney disease with heart failure and stage 1 through stage 4 chronic kidney disease, or unspecified chronic kidney disease: Secondary | ICD-10-CM | POA: Diagnosis not present

## 2024-03-16 DIAGNOSIS — M810 Age-related osteoporosis without current pathological fracture: Secondary | ICD-10-CM | POA: Diagnosis not present

## 2024-03-16 DIAGNOSIS — E1151 Type 2 diabetes mellitus with diabetic peripheral angiopathy without gangrene: Secondary | ICD-10-CM | POA: Diagnosis not present

## 2024-03-16 DIAGNOSIS — E538 Deficiency of other specified B group vitamins: Secondary | ICD-10-CM | POA: Diagnosis not present

## 2024-03-16 DIAGNOSIS — E1129 Type 2 diabetes mellitus with other diabetic kidney complication: Secondary | ICD-10-CM | POA: Insufficient documentation

## 2024-03-16 DIAGNOSIS — I2510 Atherosclerotic heart disease of native coronary artery without angina pectoris: Secondary | ICD-10-CM | POA: Diagnosis not present

## 2024-03-16 LAB — CMP (CANCER CENTER ONLY)
ALT: <5 U/L (ref 0–44)
AST: 16 U/L (ref 15–41)
Albumin: 3.5 g/dL (ref 3.5–5.0)
Alkaline Phosphatase: 96 U/L (ref 38–126)
Anion gap: 12 (ref 5–15)
BUN: 25 mg/dL — ABNORMAL HIGH (ref 8–23)
CO2: 23 mmol/L (ref 22–32)
Calcium: 9.3 mg/dL (ref 8.9–10.3)
Chloride: 109 mmol/L (ref 98–111)
Creatinine: 1.28 mg/dL — ABNORMAL HIGH (ref 0.44–1.00)
GFR, Estimated: 41 mL/min — ABNORMAL LOW (ref >60)
Glucose, Bld: 93 mg/dL (ref 70–99)
Potassium: 3.6 mmol/L (ref 3.5–5.1)
Sodium: 144 mmol/L (ref 135–145)
Total Bilirubin: 1 mg/dL (ref 0.0–1.2)
Total Protein: 5.7 g/dL — ABNORMAL LOW (ref 6.5–8.1)

## 2024-03-16 LAB — CBC WITH DIFFERENTIAL (CANCER CENTER ONLY)
Abs Immature Granulocytes: 0.02 K/uL (ref 0.00–0.07)
Basophils Absolute: 0 K/uL (ref 0.0–0.1)
Basophils Relative: 0 %
Eosinophils Absolute: 0 K/uL (ref 0.0–0.5)
Eosinophils Relative: 1 %
HCT: 35.6 % — ABNORMAL LOW (ref 36.0–46.0)
Hemoglobin: 11.8 g/dL — ABNORMAL LOW (ref 12.0–15.0)
Immature Granulocytes: 0 %
Lymphocytes Relative: 17 %
Lymphs Abs: 0.8 K/uL (ref 0.7–4.0)
MCH: 30.9 pg (ref 26.0–34.0)
MCHC: 33.1 g/dL (ref 30.0–36.0)
MCV: 93.2 fL (ref 80.0–100.0)
Monocytes Absolute: 0.4 K/uL (ref 0.1–1.0)
Monocytes Relative: 7 %
Neutro Abs: 3.6 K/uL (ref 1.7–7.7)
Neutrophils Relative %: 75 %
Platelet Count: 108 K/uL — ABNORMAL LOW (ref 150–400)
RBC: 3.82 MIL/uL — ABNORMAL LOW (ref 3.87–5.11)
RDW: 13.8 % (ref 11.5–15.5)
WBC Count: 4.9 K/uL (ref 4.0–10.5)
nRBC: 0 % (ref 0.0–0.2)

## 2024-03-16 MED ORDER — DIPHENHYDRAMINE HCL 25 MG PO TABS
50.0000 mg | ORAL_TABLET | Freq: Once | ORAL | Status: AC
  Administered 2024-03-16: 50 mg via ORAL
  Filled 2024-03-16: qty 2

## 2024-03-16 MED ORDER — ACETAMINOPHEN 325 MG PO TABS
650.0000 mg | ORAL_TABLET | Freq: Once | ORAL | Status: AC
  Administered 2024-03-16: 650 mg via ORAL
  Filled 2024-03-16: qty 2

## 2024-03-16 MED ORDER — PALONOSETRON HCL INJECTION 0.25 MG/5ML
0.2500 mg | Freq: Once | INTRAVENOUS | Status: AC
  Administered 2024-03-16: 0.25 mg via INTRAVENOUS
  Filled 2024-03-16: qty 5

## 2024-03-16 MED ORDER — DEXTROSE 5 % IV SOLN
6.0000 mg/kg | Freq: Once | INTRAVENOUS | Status: AC
  Administered 2024-03-16: 250 mg via INTRAVENOUS
  Filled 2024-03-16: qty 12.5

## 2024-03-16 MED ORDER — DEXTROSE 5 % IV SOLN
INTRAVENOUS | Status: DC
  Filled 2024-03-16: qty 250

## 2024-03-16 MED ORDER — DEXAMETHASONE SOD PHOSPHATE PF 10 MG/ML IJ SOLN
10.0000 mg | Freq: Once | INTRAMUSCULAR | Status: AC
  Administered 2024-03-16: 10 mg via INTRAVENOUS

## 2024-03-16 MED ORDER — APREPITANT 130 MG/18ML IV EMUL
130.0000 mg | Freq: Once | INTRAVENOUS | Status: AC
  Administered 2024-03-16: 130 mg via INTRAVENOUS
  Filled 2024-03-16: qty 18

## 2024-03-16 NOTE — Assessment & Plan Note (Addendum)
#   Stage IV recurrent adenocarcinoma the lung; EGFR mutated. On osimertinib  80 mg [Feb 6th 2020] #  currently on Osimertinib . PET scan OCT 2025-Disease progression as evidenced by a new hypermetabolic dominant right middle lobe nodule with additional subpleural hypermetabolic nodules in the right hemithorax. 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 STOP osimertinib . Discussed with Dr. DELENA. Fluid positive for adeno ca.   # Proceed with cycle #1 of Dato-Dxd Labs-CBC/chemistries were reviewed with the patient. iscussed that patient has terminal stage IV lung cancer-with median survival of about 1 year or so-if treatments go well.  However if patient has significant side effects/nonresponse to chemotherapy-life expectancy less than 6 months. Again oral mucositis and eye adverse events for reviewed with the patient/and son.  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 Enola.  Patient is currently being referred to oculoplastics Dr. Ashley.  Discussed with Dr. Enola that further treatments for her eye ailments should be in the context of her lung cancer prognosis.  #Right pleural effusion-status postthoracentesis-pleural fluid: Transudative; cytology negative for malignancy.  Question CHF/fluid overload.  S/p Lasix  20 mg a day- improved/stable. Currently not on any lasix  [ARF]- stable.   # reflux- reocmmend nexium/prilosec OTC.   # Chronic anemia- Hb 10-18 Dec 2021- Iron  sat- 22%; ferritin- 137- continue Iron  pills once day. Hold off Venofer - STABLE.   # ITP- chronic- platelets> 100 on promacta  25 mg/day- STABLE.   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 STABLE.   # A-fib [feb 2024] on Eliquis  2.5/cardiology. STABLE.   # right sided stroke/ seizures-on asprin; off plavix ;;  f/u neurology-[place]-  STABLE.   # CKD- stage IV; Left Hydronephrosis s/p ureteral stent placement/nephrolithiasis-~ GFR 34 today; Hypokalemia-3.4  continue K-citrate q day.STABLE.   # IV ACCESS: PIV  # ACP- DNR.  Discussed with [employer].  # DISPOSITION: # chemo today- wait for CMP # follow up in  3 weeks-  MD; labs- cbc/cmp; chemo-- Dr.B

## 2024-03-16 NOTE — Progress Notes (Signed)
 Palliative Medicine [facility]  Telephone:([phone] [phone]   Name: Stacy Cook Date: 03/16/2024 MRN: [id number]  DOB: 1935/12/26  Patient Care Team: [name], MD as PCP - General (Internal Medicine) Cindie Ole DASEN, MD as PCP - Electrophysiology (Cardiology) Rennie Cindy SAUNDERS, MD as Consulting Physician (Oncology) Aundria Ladell POUR, MD as Consulting Physician (Gastroenterology) Cindie Ole DASEN, MD as Consulting Physician (Cardiology)    REASON FOR CONSULTATION: Stacy Cook is a 88 y.o. 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  Patient has been treated with osimertinib  for years before having disease progression. She is referred to palliative care to help address goals and manage ongoing symptoms.  SOCIAL HISTORY:     reports that she has never smoked. She has never used smokeless tobacco. She reports that she does not currently use alcohol . She reports that she does not use drugs.   Patient is unmarried.  She lives at home alone.  She has 2 sons who live nearby.  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  ADVANCE DIRECTIVES:  Does not have  CODE STATUS: DNR/DNI (MOST form completed on 07/28/19)  PAST MEDICAL HISTORY: Past Medical History:  Diagnosis Date   (HFpEF) heart failure with preserved ejection fraction (HCC)    Abdominal pain    Allergic genetic state    Anxiety    Arthritis    Asthma    Atrial fibrillation (HCC)    B12 deficiency    Basal cell carcinoma 01/01/2022   glabella, EDC   Basal cell carcinoma 05/03/2022   R ant deltoid, ED[facility]   Basal cell carcinoma 05/03/2022   Xyphoid excised 06/12/2022   Bile reflux gastritis    CAD (coronary artery disease)    Cancer of right lung (HCC) 05/2015   a.) pT2a [Stage IB;T-2.3cm; visceral pleural invasion  present; pN=0]; adenocarcinoma with Lepidic 80%-20% acinar pattern;  b.) s/p RLL lobectomy in 2017; c.) recurrent stage IV in 2019 - started osimertinib  in 04/2018   Cardiac asystole (HCC) 12/09/2018   Carotid stenosis 02/07/2016   Cataract cortical, senile    Chest pain    CKD (chronic kidney disease), stage III (HCC)    Colon polyp    Degeneration of intervertebral disc of lumbar region 03/11/2014   Embolic stroke (HCC) 12/05/2018   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   a.) stage I - pathology (+) for diffuse, poorly differentiated, signet ring; b.) s/p partial gastrectomy   GERD (gastroesophageal reflux disease)    Heart block 12/10/2018   History of kidney stones    HLD (hyperlipidemia) 08/24/2013   Hypertension    IDA (iron  deficiency anemia)    Neuritis or radiculitis due to rupture of lumbar intervertebral disc 09/10/2014   Neuroendocrine tumor (HCC) 03/24/2015   Neuroma    On apixaban  therapy    Osteoporosis    Peripheral vascular disease    PONV (postoperative nausea and vomiting)    Presence of permanent cardiac pacemaker 12/09/2018   Pulmonary hypertension (HCC)    Right hemiparesis (HCC) 01/22/2019   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   a.) 23 mm MDT Evolut Pro + transcatheter valve (SN: [id number])   Sciatica    Senile purpura 10/02/2019   Severe aortic stenosis    a.) s/p TAVR 12/02/2018   Skin cancer    cut/burned LUE; cut off right  eye/nose & cut off chest (10/15/2017)   Stomach ulcer    Thrombocytopenia    a.) Tx'd with eltrombopag    Type 2 diabetes, diet controlled (HCC)     PAST SURGICAL HISTORY:  Past Surgical History:  Procedure Laterality Date   APPENDECTOMY     CARDIAC CATHETERIZATION  X2 before 10/15/2017   CARDIAC VALVE REPLACEMENT     CATARACT EXTRACTION W/PHACO Left 04/04/2017   Procedure: CATARACT EXTRACTION PHACO AND INTRAOCULAR LENS PLACEMENT (IOC);  Surgeon: Mittie Gaskin, MD;  Location: [facility] ORS;  Service:  Ophthalmology;  Laterality: Left;  Lot # [id number] H US  1:00 Ap 25% CDE 8.54   CATARACT EXTRACTION W/PHACO Right 05/15/2017   Procedure: CATARACT EXTRACTION PHACO AND INTRAOCULAR LENS PLACEMENT (IOC);  Surgeon: Mittie Gaskin, MD;  Location: [facility] ORS;  Service: Ophthalmology;  Laterality: Right;  US  01:10 AP% 18.3 CDE 12.91 Fluid pack lot # [id number]   COLONOSCOPY     CORONARY ATHERECTOMY N/A 10/15/2017   Procedure: CORONARY ATHERECTOMY;  Surgeon: Verlin Lonni BIRCH, MD;  Location: [facility];  Service: Cardiovascular;  Laterality: N/A;   CORONARY STENT INTERVENTION N/A 10/15/2017   Procedure: CORONARY STENT INTERVENTION;  Surgeon: Verlin Lonni BIRCH, MD;  Location: [facility];  Service: Cardiovascular;  Laterality: N/A;   CYSTOSCOPY/URETEROSCOPY/HOLMIUM LASER/STENT PLACEMENT Left 08/03/2019   Procedure: CYSTOSCOPY/URETEROSCOPY/HOLMIUM LASER/STENT PLACEMENT;  Surgeon: Penne Knee, MD;  Location: [facility];  Service: Urology;  Laterality: Left;   ESOPHAGOGASTRODUODENOSCOPY     ESOPHAGOGASTRODUODENOSCOPY (EGD) WITH PROPOFOL  N/A 03/27/2016   Procedure: ESOPHAGOGASTRODUODENOSCOPY (EGD) WITH PROPOFOL ;  Surgeon: Gladis RAYMOND Mariner, MD;  Location: [facility] ENDOSCOPY;  Service: Endoscopy;  Laterality: N/A;   ESOPHAGOGASTRODUODENOSCOPY (EGD) WITH PROPOFOL  N/A 05/28/2016   Procedure: ESOPHAGOGASTRODUODENOSCOPY (EGD) WITH PROPOFOL ;  Surgeon: Gladis RAYMOND Mariner, MD;  Location: [facility] ENDOSCOPY;  Service: Endoscopy;  Laterality: N/A;   ESOPHAGOGASTRODUODENOSCOPY (EGD) WITH PROPOFOL  N/A 09/25/2016   Procedure: ESOPHAGOGASTRODUODENOSCOPY (EGD) WITH PROPOFOL ;  Surgeon: Dellie Louanne MATSU, MD;  Location: [facility] ENDOSCOPY;  Service: Endoscopy;  Laterality: N/A;   ESOPHAGOGASTRODUODENOSCOPY (EGD) WITH PROPOFOL  N/A 12/18/2016   Procedure: ESOPHAGOGASTRODUODENOSCOPY (EGD) WITH PROPOFOL ;  Surgeon: Dellie Louanne MATSU, MD;  Location: [facility] ENDOSCOPY;  Service: Endoscopy;  Laterality: N/A;    ESOPHAGOGASTRODUODENOSCOPY (EGD) WITH PROPOFOL  N/A 01/23/2017   Procedure: ESOPHAGOGASTRODUODENOSCOPY (EGD) WITH PROPOFOL ;  Surgeon: Dellie Louanne MATSU, MD;  Location: [facility] ENDOSCOPY;  Service: Endoscopy;  Laterality: N/A;   ESOPHAGOGASTRODUODENOSCOPY (EGD) WITH PROPOFOL  N/A 03/20/2017   Procedure: ESOPHAGOGASTRODUODENOSCOPY (EGD) WITH PROPOFOL ;  Surgeon: Dellie Louanne MATSU, MD;  Location: [facility] ENDOSCOPY;  Service: Endoscopy;  Laterality: N/A;   ESOPHAGOGASTRODUODENOSCOPY (EGD) WITH PROPOFOL  N/A 09/23/2020   Procedure: ESOPHAGOGASTRODUODENOSCOPY (EGD) WITH PROPOFOL ;  Surgeon: Maryruth Ole DASEN, MD;  Location: [facility] ENDOSCOPY;  Service: Endoscopy;  Laterality: N/A;  KC SAYS AMPICILLIN IS NEEDED   FRACTURE SURGERY     INSERT / REPLACE / REMOVE PACEMAKER  12/09/2018   PACEMAKER LEADLESS INSERTION N/A 12/09/2018   Procedure: PACEMAKER LEADLESS INSERTION;  Surgeon: Kelsie Agent, MD;  [facility]: [facility];  Service: Cardiovascular;  Laterality: N/A;   PARTIAL GASTRECTOMY N/A 08/03/2016   Hemigastrectomy, Billroth I reconstruction Surgeon: Louanne MATSU Dellie, MD;  Location: [facility] ORS;  Service: General;  Laterality: N/A;   RIGHT/LEFT HEART CATH AND CORONARY ANGIOGRAPHY Bilateral 09/19/2017   Procedure: RIGHT/LEFT HEART CATH AND CORONARY ANGIOGRAPHY;  Surgeon: Florencio Cara BIRCH, MD;  Location: [facility] INVASIVE CV LAB;  Service: Cardiovascular;  Laterality: Bilateral;   SHOULDER ARTHROSCOPY W/ CAPSULAR REPAIR Right    SKIN CANCER EXCISION  cut/burned LUE; cut off right eye/nose & cut off chest (10/15/2017)   TEE WITHOUT CARDIOVERSION N/A 12/02/2018   Procedure: TRANSESOPHAGEAL ECHOCARDIOGRAM (TEE);  Surgeon: Verlin Lonni BIRCH, MD;  Location: [facility] INVASIVE CV LAB;  Service: Open Heart Surgery;  Laterality: N/A;   THORACOTOMY Right 05/09/2015   Procedure: THORACOTOMY, RIGHT LOWER LOBECTOMY, BRONCHOSCOPY;  Surgeon: Evalene Glasser, MD;  Location: [facility] ORS;  Service: Thoracic;   Laterality: Right;   TONSILLECTOMY  1944   TRANSCATHETER AORTIC VALVE REPLACEMENT, TRANSFEMORAL N/A 12/02/2018   Procedure: TRANSCATHETER AORTIC VALVE REPLACEMENT, TRANSFEMORAL;  Surgeon: Verlin Lonni BIRCH, MD;  Location: [facility];  Service: Open Heart Surgery;  Laterality: N/A;   TUBAL LIGATION     UPPER GI ENDOSCOPY N/A 08/03/2016   Procedure: UPPER  ENDOSCOPY;  Surgeon: Louanne KANDICE Muse, MD;  Location: [facility] ORS;  Service: General;  Laterality: N/A;   VAGINAL HYSTERECTOMY     WRIST FRACTURE SURGERY Right     HEMATOLOGY/ONCOLOGY HISTORY:  Oncology History Overview Note  # DEC 2017- Adeno ca [GATA; her 2 Neu-NEG]; signet ring [1.5 x3 mm gastric incisura; Dr.Skulskie]; EUS [Dr.Burnbridge]; no significant abnormality noted;  Reviewed at [facility] also. JAN 2018- PET NED. April 2018- S/p partial gastrectomy [Dr.Sankar]- STAGE I ADENO CA; NO adjuvant therapy.  # STAGE I CARCINOID s/p partial gastrectomy   # May 2018- Chronic Atrophic gastritis- Prevpac   # FEB 2017- ADENOCARCINOMA with Lepidic 80%-20% acinar pattern; pT2a [Stage IB;T-2.3cm; visceral pleural invasion present; pN=0 ]; AUG 2017- CT NED;   # DEC 2019- RECURRENT/STAGE IV ADENO LUNG CA- EGFR MUTATED;# Jan 6th 2020-; START Osidemrtinib;  # AUG 2020- SEVERE AS [s/p TAVR; [place]]-complicated by R sided stroke/ seizures/acute renal failure.   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 [omniseq]  # Palliative: O-1/20   DIAGNOSIS:  #ADENO CA LUNG-STAGE IV #Stomach adeno ca [stage I; dec 2017]  GOALS: pallaitive  CURRENT/MOST RECENT THERAPY - OSIMERTINIB  [Jan 6th 2020]      Primary malignant neoplasm of right lower lobe of lung (HCC)  12/02/2015 Initial Diagnosis   Primary malignant neoplasm of right lower lobe of lung (HCC)   03/16/2024 -  Chemotherapy   Patient is on Treatment Plan : lung cancer  Datopotamab Deruxtecan-dlnk  (Datroway ) q21d     Adenocarcinoma of  gastric cardia (HCC)    ALLERGIES:  is allergic to mirtazapine, pneumococcal vaccine, statins, and sulfa antibiotics.  MEDICATIONS:  Current Outpatient Medications  Medication Sig Dispense Refill   apixaban  (ELIQUIS ) 2.5 MG TABS tablet Take 1 tablet (2.5 mg total) by mouth 2 (two) times daily. 180 tablet 1   Carboxymethylcell-Glycerin  PF 0.5-0.9 % SOLN Place 2 drops into both eyes 4 (four) times daily. And as needed. (Patient not taking: Reported on 03/16/2024) 1 each 11   cholecalciferol  (VITAMIN D ) 1000 UNITS tablet Take 1,000 Units by mouth daily.     ciprofloxacin  (CILOXAN ) 0.3 % ophthalmic solution INSTILL 1 DROP INTO LEFT EYE FIVE TIMES DAILY FOR 5 DAYS     cyanocobalamin  1000 MCG tablet Take 1,000 mcg by mouth daily.      dexamethasone  (DECADRON ) 0.5 MG/5ML solution Swish 10 mL (1 mg) by mouth for 2 minutes, then spit. Repeat 4 times daily. Start on 1st day of datopotamab deruxtecan. 500 mL 4   dexamethasone  (DECADRON ) 4 MG tablet Take 2 tablets (8 mg) by mouth daily for 3 days starting the day after chemotherapy. Take with food. 30 tablet 1  eltrombopag  (PROMACTA ) 25 MG tablet Take 1 tablet (25 mg total) by mouth daily. Take on an empty stomach, 1 hour before a meal or 2 hours after. 30 tablet 2   ezetimibe  (ZETIA ) 10 MG tablet Take 1 tablet (10 mg total) by mouth daily. 90 tablet 3   ferrous sulfate  325 (65 FE) MG tablet Take 325 mg by mouth daily with breakfast.     ipratropium (ATROVENT) 0.06 % nasal spray Place 2 sprays into both nostrils 2 (two) times daily.     isosorbide  mononitrate (IMDUR ) 30 MG 24 hr tablet Take 1 tablet (30 mg total) by mouth daily. 90 tablet 3   Magnesium  100 MG TABS Take 1 tablet by mouth daily at 6 (six) AM.     metoprolol  succinate (TOPROL -XL) 50 MG 24 hr tablet Take 1 tablet (50 mg total) by mouth every evening. Take with or immediately following a meal. 30 tablet 0   ondansetron  (ZOFRAN ) 8 MG tablet Take 1 tablet (8 mg total) by mouth every 8 (eight)  hours as needed for nausea or vomiting. Start on the third day after chemotherapy. 30 tablet 1   osimertinib  mesylate (TAGRISSO ) 80 MG tablet Take 1 tablet (80 mg total) by mouth daily. 30 tablet 1   potassium citrate  (UROCIT-K ) 10 MEQ (1080 MG) SR tablet Take 1 tablet by mouth once daily 90 tablet 0   prochlorperazine  (COMPAZINE ) 10 MG tablet Take 1 tablet (10 mg total) by mouth every 6 (six) hours as needed for nausea or vomiting. 30 tablet 1   No current facility-administered medications for this visit.   Facility-Administered Medications Ordered in Other Visits  Medication Dose Route Frequency Provider Last Rate Last Admin   dextrose  5 % solution   Intravenous Continuous Brahmanday, Govinda R, MD   Stopped at 03/16/24 1402    VITAL SIGNS: There were no vitals taken for this visit. There were no vitals filed for this visit.  Estimated body mass index is 17.29 kg/m as calculated from the following:   Height as of 02/21/24: 5' 1 (1.549 m).   Weight as of an earlier encounter on 03/16/24: 91 lb 8 oz (41.5 kg).  LABS: CBC:    Component Value Date/Time   WBC 4.9 03/16/2024 0831   WBC 4.6 01/17/2024 1322   HGB 11.8 (L) 03/16/2024 0831   HGB 10.7 (L) 01/24/2018 0847   HCT 35.6 (L) 03/16/2024 0831   HCT 29.8 (L) 01/24/2018 0847   PLT 108 (L) 03/16/2024 0831   PLT 121 (L) 01/24/2018 0847   MCV 93.2 03/16/2024 0831   MCV 89 01/24/2018 0847   MCV 87 10/29/2012 1536   NEUTROABS 3.6 03/16/2024 0831   NEUTROABS 2.9 10/29/2012 1536   LYMPHSABS 0.8 03/16/2024 0831   LYMPHSABS 1.6 10/29/2012 1536   MONOABS 0.4 03/16/2024 0831   MONOABS 0.3 10/29/2012 1536   EOSABS 0.0 03/16/2024 0831   EOSABS 0.1 10/29/2012 1536   BASOSABS 0.0 03/16/2024 0831   BASOSABS 0.0 10/29/2012 1536   Comprehensive Metabolic Panel:    Component Value Date/Time   NA 144 03/16/2024 0831   NA 141 02/05/2022 1405   NA 141 07/21/2012 1529   K 3.6 03/16/2024 0831   K 3.9 07/21/2012 1529   CL 109 03/16/2024  0831   CL 109 (H) 07/21/2012 1529   CO2 23 03/16/2024 0831   CO2 27 07/21/2012 1529   BUN 25 (H) 03/16/2024 0831   BUN 34 (H) 02/05/2022 1405   BUN 16 07/21/2012 1529  CREATININE 1.28 (H) 03/16/2024 0831   CREATININE 1.11 09/03/2012 1535   GLUCOSE 93 03/16/2024 0831   GLUCOSE 104 (H) 07/21/2012 1529   CALCIUM  9.3 03/16/2024 0831   CALCIUM  8.9 07/21/2012 1529   AST 16 03/16/2024 0831   ALT <5 03/16/2024 0831   ALT 22 07/21/2012 1529   ALKPHOS 96 03/16/2024 0831   ALKPHOS 76 07/21/2012 1529   BILITOT 1.0 03/16/2024 0831   PROT 5.7 (L) 03/16/2024 0831   PROT 7.1 07/21/2012 1529   ALBUMIN 3.5 03/16/2024 0831   ALBUMIN 3.9 07/21/2012 1529    RADIOGRAPHIC STUDIES: US  THORACENTESIS ASP PLEURAL SPACE W/IMG GUIDE Result Date: 03/02/2024 INDICATION: Pleural effusion on right 88 year old female with history of CHF and lung cancer s/p RLL lobectomy, with recurrent right-sided pleural effusions. IR was requested for diagnostic and therapeutic thoracentesis. EXAM: ULTRASOUND GUIDED DIAGNOSTIC AND THERAPEUTIC RIGHT THORACENTESIS MEDICATIONS: 10 mL of 1% lidocaine . COMPLICATIONS: None immediate. PROCEDURE: An ultrasound guided thoracentesis was thoroughly discussed with the patient and questions answered. The benefits, risks, alternatives and complications were also discussed. The patient understands and wishes to proceed with the procedure. Written consent was obtained. Ultrasound was performed to localize and mark an adequate pocket of fluid in the RIGHT chest. The area was then prepped and draped in the normal sterile fashion. 1% Lidocaine  was used for local anesthesia. Under ultrasound guidance a 6 Fr Safe-T-Centesis catheter was introduced. However, the Safe-T-Centesis could not be introduced into the right pleural cavity due to notable calcification. A second location was attempted, with similar results. Dr. Hughes was consulted, and was successful at placing the Safe-T-Centesis needle and  catheter into the right pleural effusion. Thoracentesis was performed. However, the patient experienced notable pain and discomfort shortly into fluid withdrawal, and thoracentesis had to be aborted. The catheter was removed and a dressing applied. FINDINGS: A total of approximately 200 mL of initially 10 brown then turbid pleural fluid was removed. The fluid then appeared to drain several loculation pockets, with clear serous to blood-laden pleural fluid removed, with intermittent air. Samples were sent to the laboratory as requested by the clinical team. IMPRESSION: 1. Successful ultrasound guided RIGHT thoracentesis yielding 200 mL of tan brown pleural fluid. 2. Incidental note of contralateral, small volume LEFT pleural effusion. Performed by: Carlin Griffon, PA-C under supervision of Thom Hughes, MD These results were communicated in person to provider FUAD ALESKEROV , who verbally acknowledged these results. Electronically Signed   By: Thom Hughes M.D.   On: 03/02/2024 12:46   DG Chest Port 1 View Result Date: 03/02/2024 CLINICAL DATA:  88 year old female with history of right pleural effusion status post thoracentesis. EXAM: PORTABLE CHEST - 1 VIEW COMPARISON:  01/13/2024, 02/24/2024 FINDINGS: Unchanged mediastinal contours. Similar appearing calcification of the aortic arch and postsurgical changes after TAVR. Unchanged position of lead less pacemaker projecting over the right ventricle. Similar diffuse, basal predominant patchy opacifications about the right lung. Obscuration of the right costophrenic angle. Pleural thickening about the right apex, unchanged. The left lung is clear. No pneumothorax. No acute osseous abnormality. IMPRESSION: 1. Similar appearing diffuse, basal predominant patchy opacifications about the right lung. 2. Small right pleural effusion after thoracentesis. No pneumothorax. Electronically Signed   By: Ester Sides M.D.   On: 03/02/2024 12:10   CT SUPER D CHEST WO MONARCH  PILOT Result Date: 02/29/2024 EXAM: CT CHEST WITHOUT CONTRAST 02/24/2024 06:02:19 PM TECHNIQUE: CT of the chest was performed without the administration of intravenous contrast. Multiplanar reformatted images are provided for  review. Automated exposure control, iterative reconstruction, and/or weight based adjustment of the mA/kV was utilized to reduce the radiation dose to as low as reasonably achievable. COMPARISON: PET CT 01/08/2024. CLINICAL HISTORY: FINDINGS: MEDIASTINUM: Status post TAVR. Aortic atherosclerosis and extensive 3 vessel coronary artery calcifications. Mitral valve calcifications. Cardiac enlargement. No significant pericardial effusion. The central airways are clear. No enlarged mediastinal effusion. LYMPH NODES: No mediastinal or axillary lymphadenopathy. Hilar lymph nodes are suboptimally evaluated due to lack of IV contrast. LUNGS AND PLEURA: Interval increase in volume of the left pleural effusion, which is now moderate with overlying mild pleural thickening containing calcifications. No nodule or mass identified within the left lung. Status post right lower lobectomy. Similar appearance of moderate volume loculated right pleural fluid collection overlying the posterior and lateral right lung bases with anterior extension, axial image 114/2. SABRA Background ground glass attenuation and intralobular septal thickening throughout the right upper lobe and right lower lobe is again noted with progressive, multifocal areas of consolidative change extending into the posterior right apex. Tracer avid nodular area identified on the previous PET CT within the right middle lobe measures 3.7 x 3.2 cm, image 89/2. Previously 2.6 x 2.4 cm. No pneumothorax. SOFT TISSUES/BONES: No acute abnormality of the bones or soft tissues. UPPER ABDOMEN: A small volume of upper abdominal ascites is again noted. Signs of peritoneal disease with nodularity identified is again noted concerning for peritoneal carcinomatosis.  IMPRESSION: 1. Progressive multifocal consolidative changes in the right upper and lower lobes with extension into the posterior right apex, concerning for progression of disease. 2. Right middle lobe tracer-avid nodule increased to 3.7 cm from 2.6 x 2.4 cm, compatible with progression of disease. 3. Ground glass attenuation and interlobular septal thickening within the right upper and right middle lobes concerning for lymphogenic spread of tumor. 4. Increased volume of left pleural effusion. 5. Moderate loculated right pleural fluid collection, unchanged. 6. Small volume upper abdominal ascites with peritoneal nodularity, concerning for peritoneal carcinomatosis. Electronically signed by: Waddell Calk MD 02/29/2024 04:49 AM EST RP Workstation: [id number]    PERFORMANCE STATUS (ECOG) : 2 - Symptomatic, <50% confined to bed  Review of Systems Unless otherwise noted, a complete review of systems is negative.  Physical Exam General: NAD, frail appearing, thin Pulmonary: Unlabored Extremities: no edema, no joint deformities Skin: no rashes Neurological: Weakness more pronounced on right side but otherwise nonfocal  IMPRESSION: Patient with stage IV NSCLC has been on treatment with Osimertinib  for past 5 years.  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  Patient also recently diagnosed with eye cancer.  Patient has been rotated to Datroway .   I met with patient and her son.  Patient confirmed willingness to proceed with current scope of treatment.  However, she recognizes that her cancer is incurable and long-term prognosis is poor.  Son admits that he likely would not have chosen to pursue treatment.  We discussed the importance of maintaining quality of life.  At baseline, patient still lives at home alone.  Son lives  nearby.  I had previously completed a MOST form with patient as she opted for DNR/DNI.  She confirmed that this is still consistent with her wishes.  She is not interested in resuscitation nor would she want her life prolonged artificially on machines.  Son verbalized agreement with DNR/DNI.  PLAN: -Continue current scope of treatment -DNR/DNI -  Follow-up telephone visit in 1 month  Case and plan discussed with Dr. Rennie   Patient expressed understanding and was in agreement with this plan. She also understands that She can call the clinic at any time with any questions, concerns, or complaints.     Time Total: 15 minutes  Visit consisted of counseling and education dealing with the complex and emotionally intense issues of symptom management and palliative care in the setting of serious and potentially life-threatening illness.Greater than 50%  of this time was spent counseling and coordinating care related to the above assessment and plan.  Signed by: Fonda Mower, PhD, NP-C

## 2024-03-16 NOTE — Progress Notes (Signed)
 Spoke with son and discussed importance of using eye drops as ordered. The steroid mouthwash is to help prevent mouth ulcers. She ials needs to take the oral dexamethasone  post chemo as ordered.

## 2024-03-16 NOTE — Progress Notes (Signed)
 Patient has no concerns. Patient did mention she was a little winded today.

## 2024-03-16 NOTE — Patient Instructions (Signed)
#   STOP tagrisso -

## 2024-03-16 NOTE — Progress Notes (Signed)
 [facility] OFFICE PROGRESS NOTE  Patient Care Team: Stacy Ophelia JINNY DOUGLAS, MD as PCP - General (Internal Medicine) Stacy Ole DASEN, MD as PCP - Electrophysiology (Cardiology) Stacy Cindy SAUNDERS, MD as Consulting Physician (Oncology) Stacy Ladell POUR, MD as Consulting Physician (Gastroenterology) Stacy Ole DASEN, MD as Consulting Physician (Cardiology)   Cancer Staging  Primary malignant neoplasm of right lower lobe of lung [facility]) Staging form: Lung, AJCC 7th Edition - Clinical: No stage assigned - Unsigned    Oncology History Overview Note  # DEC 2017- Adeno ca [GATA; her 2 Neu-NEG]; signet ring [1.5 x3 mm gastric incisura; Dr.Skulskie]; EUS [Dr.Burnbridge]; no significant abnormality noted;  Reviewed at [facility] also. JAN 2018- PET NED. April 2018- S/p partial gastrectomy [Dr.Sankar]- STAGE I ADENO CA; NO adjuvant therapy.  # STAGE I CARCINOID s/p partial gastrectomy   # May 2018- Chronic Atrophic gastritis- Prevpac   # FEB 2017- ADENOCARCINOMA with Lepidic 80%-20% acinar pattern; pT2a [Stage IB;T-2.3cm; visceral pleural invasion present; pN=0 ]; AUG 2017- CT NED;   # DEC 2019- RECURRENT/STAGE IV ADENO LUNG CA- EGFR MUTATED;# Jan 6th 2020-; START Osidemrtinib;  # AUG 2020- SEVERE AS [s/p TAVR; [place]]-complicated by R sided stroke/ seizures/acute renal failure.   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 [omniseq]  # Palliative: O-1/20   DIAGNOSIS:  #ADENO CA LUNG-STAGE IV #Stomach adeno ca [stage I; dec 2017]  GOALS: pallaitive  CURRENT/MOST RECENT THERAPY - OSIMERTINIB  [Jan 6th 2020]      Primary malignant neoplasm of right lower lobe of lung (HCC)  12/02/2015 Initial Diagnosis   Primary malignant neoplasm of right lower lobe of lung (HCC)   03/16/2024 -  Chemotherapy   Patient is on Treatment Plan : lung cancer  Datopotamab Deruxtecan-dlnk  (Datroway ) q21d     Adenocarcinoma of gastric cardia (HCC)    INTERVAL HISTORY: Patient is alone. She is in a wheelchair.  Stacy Cook 88 y.o.  female pleasant patient above history of recurrent/stage IV adenocarcinoma lung/EGFR mutated currently on osimertinib ; ITP on promacta ; A.fib on eliquis  is here for follow-up-in review results of the PET scan.   Discussed the use of AI scribe software for clinical note transcription with the patient, who gave verbal consent to proceed.  History of Present Illness   Stacy Cook is an 88 year old female who presents for follow-up regarding Lung cancer diagnosis.   Patient states that she has been recent diagnosed with cancer of the eye-and is currently awaiting further evaluation.  She experiences coughing and wheezing at night, which sometimes requires her to sit up to alleviate symptoms.  She feels uneasy and disheartened by her current health situation, particularly during the holiday season. She has a lack of energy, decreased appetite, and a general disinterest in daily activities, stating she doesn't care whether she eats or drinks.  She has a history of heart and lung issues, including a pacemaker.      Review of Systems  Constitutional:  Positive for malaise/fatigue. Negative for chills, diaphoresis and fever.  HENT:  Negative for nosebleeds and sore throat.   Eyes:  Negative for double vision.  Respiratory:  Negative for hemoptysis, sputum production and wheezing.   Cardiovascular:  Negative for chest pain, palpitations, orthopnea and leg swelling.  Gastrointestinal:  Negative for blood in stool, constipation, melena, nausea and vomiting.  Genitourinary:  Negative for dysuria, frequency and urgency.  Musculoskeletal:  Positive for back pain and joint  pain.  Neurological:  Positive for focal weakness. Negative for dizziness, tingling, weakness and headaches.  Endo/Heme/Allergies:  Bruises/bleeds easily.  Psychiatric/Behavioral:  Negative for depression. The patient is not  nervous/anxious and does not have insomnia.       PAST MEDICAL HISTORY :  Past Medical History:  Diagnosis Date   (HFpEF) heart failure with preserved ejection fraction (HCC)    Abdominal pain    Allergic genetic state    Anxiety    Arthritis    Asthma    Atrial fibrillation (HCC)    B12 deficiency    Basal cell carcinoma 01/01/2022   glabella, [facility]   Basal cell carcinoma 05/03/2022   R ant deltoid, [facility]   Basal cell carcinoma 05/03/2022   Xyphoid excised 06/12/2022   Bile reflux gastritis    CAD (coronary artery disease)    Cancer of right lung (HCC) 05/2015   a.) pT2a [Stage IB;T-2.3cm; visceral pleural invasion present; pN=0]; adenocarcinoma with Lepidic 80%-20% acinar pattern;  b.) s/p RLL lobectomy in 2017; c.) recurrent stage IV in 2019 - started osimertinib  in 04/2018   Cardiac asystole (HCC) 12/09/2018   Carotid stenosis 02/07/2016   Cataract cortical, senile    Chest pain    CKD (chronic kidney disease), stage III (HCC)    Colon polyp    Degeneration of intervertebral disc of lumbar region 03/11/2014   Embolic stroke (HCC) 12/05/2018   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   a.) stage I - pathology (+) for diffuse, poorly differentiated, signet ring; b.) s/p partial gastrectomy   GERD (gastroesophageal reflux disease)    Heart block 12/10/2018   History of kidney stones    HLD (hyperlipidemia) 08/24/2013   Hypertension    IDA (iron  deficiency anemia)    Neuritis or radiculitis due to rupture of lumbar intervertebral disc 09/10/2014   Neuroendocrine tumor (HCC) 03/24/2015   Neuroma    On apixaban  therapy    Osteoporosis    Peripheral vascular disease    PONV (postoperative nausea and vomiting)    Presence of permanent cardiac pacemaker 12/09/2018   Pulmonary hypertension (HCC)    Right hemiparesis (HCC) 01/22/2019   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   a.) 23 mm MDT Evolut Pro + transcatheter valve (SN: [id number])   Sciatica    Senile  purpura 10/02/2019   Severe aortic stenosis    a.) s/p TAVR 12/02/2018   Skin cancer    cut/burned LUE; cut off right eye/nose & cut off chest (10/15/2017)   Stomach ulcer    Thrombocytopenia    a.) Tx'd with eltrombopag    Type 2 diabetes, diet controlled (HCC)     PAST SURGICAL HISTORY :   Past Surgical History:  Procedure Laterality Date   APPENDECTOMY     CARDIAC CATHETERIZATION  X2 before 10/15/2017   CARDIAC VALVE REPLACEMENT     CATARACT EXTRACTION W/PHACO Left 04/04/2017   Procedure: CATARACT EXTRACTION PHACO AND INTRAOCULAR LENS PLACEMENT (IOC);  Surgeon: Mittie Gaskin, MD;  Location: [facility] ORS;  Service: Ophthalmology;  Laterality: Left;  Lot # [id number] H US  1:00 Ap 25% CDE 8.54   CATARACT EXTRACTION W/PHACO Right 05/15/2017   Procedure: CATARACT EXTRACTION PHACO AND INTRAOCULAR LENS PLACEMENT (IOC);  Surgeon: Mittie Gaskin, MD;  Location: [facility] OR[facility];  Service: Ophthalmology;  Laterality: Right;  US  01:10 AP% 18.3 CDE 12.91 Fluid pack lot # [id number]   COLONOSCOPY     CORONARY ATHERECTOMY N/A 10/15/2017   Procedure: CORONARY ATHERECTOMY;  Surgeon: Verlin Lonni BIRCH, MD;  Location: [facility];  Service: Cardiovascular;  Laterality: N/A;   CORONARY STENT INTERVENTION N/A 10/15/2017   Procedure: CORONARY STENT INTERVENTION;  Surgeon: Verlin Lonni BIRCH, MD;  Location: [facility];  Service: Cardiovascular;  Laterality: N/A;   CYSTOSCOPY/URETEROSCOPY/HOLMIUM LASER/STENT PLACEMENT Left 08/03/2019   Procedure: CYSTOSCOPY/URETEROSCOPY/HOLMIUM LASER/STENT PLACEMENT;  Surgeon: Penne Knee, MD;  Location: [facility];  Service: Urology;  Laterality: Left;   ESOPHAGOGASTRODUODENOSCOPY     ESOPHAGOGASTRODUODENOSCOPY (EGD) WITH PROPOFOL  N/A 03/27/2016   Procedure: ESOPHAGOGASTRODUODENOSCOPY (EGD) WITH PROPOFOL ;  Surgeon: Gladis RAYMOND Mariner, MD;  Location: [facility] ENDOSCOPY;  Service: Endoscopy;  Laterality: N/A;   ESOPHAGOGASTRODUODENOSCOPY (EGD) WITH  PROPOFOL  N/A 05/28/2016   Procedure: ESOPHAGOGASTRODUODENOSCOPY (EGD) WITH PROPOFOL ;  Surgeon: Gladis RAYMOND Mariner, MD;  Location: [facility] ENDOSCOPY;  Service: Endoscopy;  Laterality: N/A;   ESOPHAGOGASTRODUODENOSCOPY (EGD) WITH PROPOFOL  N/A 09/25/2016   Procedure: ESOPHAGOGASTRODUODENOSCOPY (EGD) WITH PROPOFOL ;  Surgeon: Dellie Louanne MATSU, MD;  Location: [facility] ENDOSCOPY;  Service: Endoscopy;  Laterality: N/A;   ESOPHAGOGASTRODUODENOSCOPY (EGD) WITH PROPOFOL  N/A 12/18/2016   Procedure: ESOPHAGOGASTRODUODENOSCOPY (EGD) WITH PROPOFOL ;  Surgeon: Dellie Louanne MATSU, MD;  Location: [facility] ENDOSCOPY;  Service: Endoscopy;  Laterality: N/A;   ESOPHAGOGASTRODUODENOSCOPY (EGD) WITH PROPOFOL  N/A 01/23/2017   Procedure: ESOPHAGOGASTRODUODENOSCOPY (EGD) WITH PROPOFOL ;  Surgeon: Dellie Louanne MATSU, MD;  Location: [facility] ENDOSCOPY;  Service: Endoscopy;  Laterality: N/A;   ESOPHAGOGASTRODUODENOSCOPY (EGD) WITH PROPOFOL  N/A 03/20/2017   Procedure: ESOPHAGOGASTRODUODENOSCOPY (EGD) WITH PROPOFOL ;  Surgeon: Dellie Louanne MATSU, MD;  Location: [facility] ENDOSCOPY;  Service: Endoscopy;  Laterality: N/A;   ESOPHAGOGASTRODUODENOSCOPY (EGD) WITH PROPOFOL  N/A 09/23/2020   Procedure: ESOPHAGOGASTRODUODENOSCOPY (EGD) WITH PROPOFOL ;  Surgeon: Maryruth Ole DASEN, MD;  Location: [facility] ENDOSCOPY;  Service: Endoscopy;  Laterality: N/A;  [facility] SAYS AMPICILLIN IS NEEDED   FRACTURE SURGERY     INSERT / REPLACE / REMOVE PACEMAKER  12/09/2018   PACEMAKER LEADLESS INSERTION N/A 12/09/2018   Procedure: PACEMAKER LEADLESS INSERTION;  Surgeon: Kelsie Agent, MD;  Location: [facility];  Service: Cardiovascular;  Laterality: N/A;   PARTIAL GASTRECTOMY N/A 08/03/2016   Hemigastrectomy, Billroth I reconstruction Surgeon: Louanne MATSU Dellie, MD;  Location: [facility] OR[facility];  Service: General;  Laterality: N/A;   RIGHT/LEFT HEART CATH AND CORONARY ANGIOGRAPHY Bilateral 09/19/2017   Procedure: RIGHT/LEFT HEART CATH AND CORONARY ANGIOGRAPHY;   Surgeon: Florencio Cara BIRCH, MD;  Location: [facility] INVASIVE CV LAB;  Service: Cardiovascular;  Laterality: Bilateral;   SHOULDER ARTHROSCOPY W/ CAPSULAR REPAIR Right    SKIN CANCER EXCISION     cut/burned LUE; cut off right eye/nose & cut off chest (10/15/2017)   TEE WITHOUT CARDIOVERSION N/A 12/02/2018   Procedure: TRANSESOPHAGEAL ECHOCARDIOGRAM (TEE);  Surgeon: Verlin Lonni BIRCH, MD;  Location[facility] INVASIVE CV LAB;  Service: Open Heart Surgery;  Laterality: N/A;   THORACOTOMY Right 05/09/2015   Procedure: THORACOTOMY, RIGHT LOWER LOBECTOMY, BRONCHOSCOPY;  Surgeon: Evalene Glasser, MD;  Location: [facility] ORS;  Service: Thoracic;  Laterality: Right;   TONSILLECTOMY  1944   TRANSCATHETER AORTIC VALVE REPLACEMENT, TRANSFEMORAL N/A 12/02/2018   Procedure: TRANSCATHETER AORTIC VALVE REPLACEMENT, TRANSFEMORAL;  Surgeon: Verlin Lonni BIRCH, MD;  Location: [facility];  Service: Open Heart Surgery;  Laterality: N/A;   TUBAL LIGATION     UPPER GI ENDOSCOPY N/A 08/03/2016   Procedure: UPPER  ENDOSCOPY;  Surgeon: Louanne MATSU Dellie, MD;  Location: [facility] ORS;  Service: General;  Laterality: N/A;   VAGINAL HYSTERECTOMY     WRIST FRACTURE SURGERY Right     FAMILY HISTORY :   Family History  Problem Relation Age of Onset   Aortic aneurysm Mother    Kidney Stones Mother    Hypertension Mother    Hypertension Father    Diabetes Other    Breast cancer Neg Hx    Prostate cancer Neg Hx    Bladder Cancer Neg Hx    Kidney cancer Neg Hx     SOCIAL HISTORY:   Social History   Tobacco Use   Smoking status: Never   Smokeless tobacco: Never  Vaping Use   Vaping status: Never Used  Substance Use Topics   Alcohol  use: Not Currently   Drug use: Never    ALLERGIES:  is allergic to mirtazapine, pneumococcal vaccine, statins, and sulfa antibiotics.  MEDICATIONS:  Current Outpatient Medications  Medication Sig Dispense Refill   apixaban  (ELIQUIS ) 2.5 MG TABS tablet Take 1 tablet (2.5 mg  total) by mouth 2 (two) times daily. 180 tablet 1   cholecalciferol  (VITAMIN D ) 1000 UNITS tablet Take 1,000 Units by mouth daily.     ciprofloxacin  (CILOXAN ) 0.3 % ophthalmic solution INSTILL 1 DROP INTO LEFT EYE FIVE TIMES DAILY FOR 5 DAYS     cyanocobalamin  1000 MCG tablet Take 1,000 mcg by mouth daily.      dexamethasone  (DECADRON ) 0.5 MG/5ML solution Swish 10 mL (1 mg) by mouth for 2 minutes, then spit. Repeat 4 times daily. Start on 1st day of datopotamab deruxtecan. 500 mL 4   dexamethasone  (DECADRON ) 4 MG tablet Take 2 tablets (8 mg) by mouth daily for 3 days starting the day after chemotherapy. Take with food. 30 tablet 1   eltrombopag  (PROMACTA ) 25 MG tablet Take 1 tablet (25 mg total) by mouth daily. Take on an empty stomach, 1 hour before a meal or 2 hours after. 30 tablet 2   ezetimibe  (ZETIA ) 10 MG tablet Take 1 tablet (10 mg total) by mouth daily. 90 tablet 3   ferrous sulfate  325 (65 FE) MG tablet Take 325 mg by mouth daily with breakfast.     ipratropium (ATROVENT) 0.06 % nasal spray Place 2 sprays into both nostrils 2 (two) times daily.     isosorbide  mononitrate (IMDUR ) 30 MG 24 hr tablet Take 1 tablet (30 mg total) by mouth daily. 90 tablet 3   Magnesium  100 MG TABS Take 1 tablet by mouth daily at 6 (six) AM.     metoprolol  succinate (TOPROL -XL) 50 MG 24 hr tablet Take 1 tablet (50 mg total) by mouth every evening. Take with or immediately following a meal. 30 tablet 0   ondansetron  (ZOFRAN ) 8 MG tablet Take 1 tablet (8 mg total) by mouth every 8 (eight) hours as needed for nausea or vomiting. Start on the third day after chemotherapy. 30 tablet 1   osimertinib  mesylate (TAGRISSO ) 80 MG tablet Take 1 tablet (80 mg total) by mouth daily. 30 tablet 1   potassium citrate  (UROCIT-K ) 10 MEQ (1080 MG) SR tablet Take 1 tablet by mouth once daily 90 tablet 0   prochlorperazine  (COMPAZINE ) 10 MG tablet Take 1 tablet (10 mg total) by mouth every 6 (six) hours as needed for nausea or  vomiting. 30 tablet 1   Carboxymethylcell-Glycerin  PF 0.5-0.9 % SOLN Place 2 drops into both eyes 4 (four) times daily. And as needed. (Patient not taking: Reported on 03/16/2024) 1 each 11   No current facility-administered medications for this visit.   Facility-Administered Medications Ordered in Other Visits  Medication Dose Route Frequency Provider Last Rate Last Admin   dextrose  5 % solution  Intravenous Continuous Jaquia Benedicto R, MD 10 mL/hr at 03/16/24 1110 New Bag at 03/16/24 1110    PHYSICAL EXAMINATION: ECOG PERFORMANCE STATUS: 0 - Asymptomatic  BP (!) 156/48   Pulse 77   Temp 98.6 F (37 C)   Resp 18   Wt 91 lb 8 oz (41.5 kg)   SpO2 98%   BMI 17.29 kg/m   Filed Weights   03/16/24 0840  Weight: 91 lb 8 oz (41.5 kg)    Decreased breath sounds on the right side compared to left.  Physical Exam HENT:     Head: Normocephalic and atraumatic.     Mouth/Throat:     Pharynx: No oropharyngeal exudate.  Eyes:     Pupils: Pupils are equal, round, and reactive to light.  Cardiovascular:     Rate and Rhythm: Normal rate and regular rhythm.     Heart sounds: Murmur heard.  Pulmonary:     Effort: No respiratory distress.     Breath sounds: No wheezing.  Abdominal:     General: Bowel sounds are normal. There is no distension.     Palpations: Abdomen is soft. There is no mass.     Tenderness: There is no abdominal tenderness. There is no guarding or rebound.  Musculoskeletal:        General: No tenderness. Normal range of motion.     Cervical back: Normal range of motion and neck supple.  Skin:    General: Skin is warm.  Neurological:     Mental Status: She is alert and oriented to person, place, and time.     Comments: Weakness of the right upper extremity more than lower extremity.  Psychiatric:        Mood and Affect: Affect normal.    LABORATORY DATA:  I have reviewed the data as listed    Component Value Date/Time   NA 144 03/16/2024 0831   NA 141  02/05/2022 1405   NA 141 07/21/2012 1529   K 3.6 03/16/2024 0831   K 3.9 07/21/2012 1529   CL 109 03/16/2024 0831   CL 109 (H) 07/21/2012 1529   CO2 23 03/16/2024 0831   CO2 27 07/21/2012 1529   GLUCOSE 93 03/16/2024 0831   GLUCOSE 104 (H) 07/21/2012 1529   BUN 25 (H) 03/16/2024 0831   BUN 34 (H) 02/05/2022 1405   BUN 16 07/21/2012 1529   CREATININE 1.28 (H) 03/16/2024 0831   CREATININE 1.11 09/03/2012 1535   CALCIUM  9.3 03/16/2024 0831   CALCIUM  8.9 07/21/2012 1529   PROT 5.7 (L) 03/16/2024 0831   PROT 7.1 07/21/2012 1529   ALBUMIN 3.5 03/16/2024 0831   ALBUMIN 3.9 07/21/2012 1529   AST 16 03/16/2024 0831   ALT <5 03/16/2024 0831   ALT 22 07/21/2012 1529   ALKPHOS 96 03/16/2024 0831   ALKPHOS 76 07/21/2012 1529   BILITOT 1.0 03/16/2024 0831   GFRNONAA 41 (L) 03/16/2024 0831   GFRNONAA 49 (L) 09/03/2012 1535   GFRAA 29 (L) 01/05/2020 1440   GFRAA 57 (L) 09/03/2012 1535    No results found for: SPEP, UPEP  Lab Results  Component Value Date   WBC 4.9 03/16/2024   NEUTROABS 3.6 03/16/2024   HGB 11.8 (L) 03/16/2024   HCT 35.6 (L) 03/16/2024   MCV 93.2 03/16/2024   PLT 108 (L) 03/16/2024      Chemistry      Component Value Date/Time   NA 144 03/16/2024 0831   NA 141 02/05/2022 1405  NA 141 07/21/2012 1529   K 3.6 03/16/2024 0831   K 3.9 07/21/2012 1529   CL 109 03/16/2024 0831   CL 109 (H) 07/21/2012 1529   CO2 23 03/16/2024 0831   CO2 27 07/21/2012 1529   BUN 25 (H) 03/16/2024 0831   BUN 34 (H) 02/05/2022 1405   BUN 16 07/21/2012 1529   CREATININE 1.28 (H) 03/16/2024 0831   CREATININE 1.11 09/03/2012 1535      Component Value Date/Time   CALCIUM  9.3 03/16/2024 0831   CALCIUM  8.9 07/21/2012 1529   ALKPHOS 96 03/16/2024 0831   ALKPHOS 76 07/21/2012 1529   AST 16 03/16/2024 0831   ALT <5 03/16/2024 0831   ALT 22 07/21/2012 1529   BILITOT 1.0 03/16/2024 0831       RADIOGRAPHIC STUDIES: I have personally reviewed the radiological images as  listed and agreed with the findings in the report. No results found.   ASSESSMENT & PLAN:  Primary malignant neoplasm of right lower lobe of lung (HCC) # Stage IV recurrent adenocarcinoma the lung; EGFR mutated. On osimertinib  80 mg [Feb 6th 2020] #  currently on Osimertinib . PET scan OCT 2025-Disease progression as evidenced by a new hypermetabolic dominant right middle lobe nodule with additional subpleural hypermetabolic nodules in the right hemithorax. 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 STOP osimertinib . Discussed with Dr. DELENA. Fluid positive for adeno ca.   # Proceed with cycle #1 of Dato-Dxd Labs-CBC/chemistries were reviewed with the patient. iscussed that patient has terminal stage IV lung cancer-with median survival of about 1 year or so-if treatments go well.  However if patient has significant side effects/nonresponse to chemotherapy-life expectancy less than 6 months. Again oral mucositis and eye adverse events for reviewed with the patient/and son.  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 Enola.  Patient is currently being referred to oculoplastics Dr. Ashley.  Discussed with Dr. Enola that further treatments for her eye ailments should be in the context of her lung cancer prognosis.  #Right pleural effusion-status postthoracentesis-pleural fluid: Transudative; cytology negative for malignancy.  Question CHF/fluid overload.  S/p Lasix  20 mg a day- improved/stable. Currently not on any lasix  [ARF]- stable.   # reflux- reocmmend nexium/prilosec OTC.   # Chronic anemia- Hb 10-18 Dec 2021- Iron  sat- 22%; ferritin- 137- continue Iron  pills once day. Hold off Venofer - STABLE.   # ITP- chronic- platelets> 100 on promacta  25 mg/day- STABLE.   # Stomach cancer stage I status post  gastrectomy; June 2022- S/p EGD- [facility]. STABLE.   # A-fib [feb 2024] on Eliquis  2.5/cardiology. STABLE.   # right sided stroke/ seizures-on asprin; off plavix ;; f/u neurology-[place]-  STABLE.   # CKD- stage IV; Left Hydronephrosis s/p ureteral stent placement/nephrolithiasis-~ GFR 34 today; Hypokalemia-3.4  continue K-citrate q day.STABLE.   # IV ACCESS: PIV  # ACP- DNR.  Discussed with [employer].  # DISPOSITION: # chemo today- wait for CMP # follow up in  3 weeks-  MD; labs- cbc/cmp; chemo-- Dr.B       Orders Placed This Encounter  Procedures   CBC with Differential (Cancer Center Only)    Standing Status:   Future    Expected Date:   04/06/2024    Expiration Date:   04/06/2025   CMP (Cancer Center only)    Standing Status:   Future    Expected Date:  04/06/2024    Expiration Date:   04/06/2025   All questions were answered. The patient knows to call the clinic with any problems, questions or concerns.      Cindy JONELLE Joe, MD 03/16/2024 1:56 PM

## 2024-03-17 ENCOUNTER — Other Ambulatory Visit: Payer: Self-pay

## 2024-03-17 ENCOUNTER — Inpatient Hospital Stay: Admitting: Internal Medicine

## 2024-03-17 ENCOUNTER — Inpatient Hospital Stay

## 2024-03-17 ENCOUNTER — Telehealth: Payer: Self-pay

## 2024-03-17 NOTE — Telephone Encounter (Signed)
 Telephone call to patient for follow up after receiving first infusion.   Patient states infusion went great.  States not eating good but trying to drinking plenty of fluids.   Denies any nausea or vomiting.  Encouraged patient to call for any concerns or questions.

## 2024-03-20 ENCOUNTER — Other Ambulatory Visit: Payer: Self-pay | Admitting: Internal Medicine

## 2024-03-20 ENCOUNTER — Other Ambulatory Visit (HOSPITAL_COMMUNITY): Payer: Self-pay

## 2024-03-20 ENCOUNTER — Other Ambulatory Visit: Payer: Self-pay

## 2024-03-20 DIAGNOSIS — D693 Immune thrombocytopenic purpura: Secondary | ICD-10-CM

## 2024-03-20 MED ORDER — ELTROMBOPAG OLAMINE 25 MG PO TABS
25.0000 mg | ORAL_TABLET | Freq: Every day | ORAL | 2 refills | Status: DC
  Filled 2024-03-20 – 2024-04-07 (×3): qty 30, 30d supply, fill #0

## 2024-03-23 ENCOUNTER — Emergency Department: Admission: EM | Admit: 2024-03-23 | Discharge: 2024-03-23 | Disposition: A | Discharge status: Home / Self Care

## 2024-03-23 ENCOUNTER — Other Ambulatory Visit: Payer: Self-pay

## 2024-03-23 ENCOUNTER — Emergency Department

## 2024-03-23 DIAGNOSIS — I509 Heart failure, unspecified: Secondary | ICD-10-CM | POA: Diagnosis not present

## 2024-03-23 DIAGNOSIS — M25521 Pain in right elbow: Secondary | ICD-10-CM | POA: Diagnosis not present

## 2024-03-23 DIAGNOSIS — S0990XA Unspecified injury of head, initial encounter: Secondary | ICD-10-CM | POA: Diagnosis present

## 2024-03-23 DIAGNOSIS — S0003XA Contusion of scalp, initial encounter: Secondary | ICD-10-CM | POA: Diagnosis not present

## 2024-03-23 DIAGNOSIS — W19XXXA Unspecified fall, initial encounter: Secondary | ICD-10-CM

## 2024-03-23 DIAGNOSIS — Z7901 Long term (current) use of anticoagulants: Secondary | ICD-10-CM | POA: Diagnosis not present

## 2024-03-23 LAB — CBC
HCT: 36.3 % (ref 36.0–46.0)
Hemoglobin: 11.5 g/dL — ABNORMAL LOW (ref 12.0–15.0)
MCH: 30.7 pg (ref 26.0–34.0)
MCHC: 31.7 g/dL (ref 30.0–36.0)
MCV: 96.8 fL (ref 80.0–100.0)
Platelets: 92 K/uL — ABNORMAL LOW (ref 150–400)
RBC: 3.75 MIL/uL — ABNORMAL LOW (ref 3.87–5.11)
RDW: 13.4 % (ref 11.5–15.5)
WBC: 6.2 K/uL (ref 4.0–10.5)
nRBC: 0 % (ref 0.0–0.2)

## 2024-03-23 LAB — COMPREHENSIVE METABOLIC PANEL WITH GFR
ALT: 8 U/L (ref 0–44)
AST: 18 U/L (ref 15–41)
Albumin: 3.3 g/dL — ABNORMAL LOW (ref 3.5–5.0)
Alkaline Phosphatase: 105 U/L (ref 38–126)
Anion gap: 15 (ref 5–15)
BUN: 49 mg/dL — ABNORMAL HIGH (ref 8–23)
CO2: 20 mmol/L — ABNORMAL LOW (ref 22–32)
Calcium: 8.9 mg/dL (ref 8.9–10.3)
Chloride: 104 mmol/L (ref 98–111)
Creatinine, Ser: 1.63 mg/dL — ABNORMAL HIGH (ref 0.44–1.00)
GFR, Estimated: 30 mL/min — ABNORMAL LOW (ref >60)
Glucose, Bld: 173 mg/dL — ABNORMAL HIGH (ref 70–99)
Potassium: 4 mmol/L (ref 3.5–5.1)
Sodium: 139 mmol/L (ref 135–145)
Total Bilirubin: 0.9 mg/dL (ref 0.0–1.2)
Total Protein: 5.3 g/dL — ABNORMAL LOW (ref 6.5–8.1)

## 2024-03-23 LAB — URINALYSIS, ROUTINE W REFLEX MICROSCOPIC
Bilirubin Urine: NEGATIVE
Glucose, UA: NEGATIVE mg/dL
Hgb urine dipstick: NEGATIVE
Ketones, ur: NEGATIVE mg/dL
Leukocytes,Ua: NEGATIVE
Nitrite: NEGATIVE
Protein, ur: NEGATIVE mg/dL
Specific Gravity, Urine: 1.021 (ref 1.005–1.030)
pH: 5 (ref 5.0–8.0)

## 2024-03-23 NOTE — ED Notes (Signed)
 Discharge instructions provided by edp was reviewed with the pt. Pt verbalized understanding with no additional questions at this time. Pt going home with son/caregiver at bedside. Pt wheelchair to car

## 2024-03-23 NOTE — ED Provider Notes (Signed)
 [facility] Provider Note    Event Date/Time   First MD Initiated Contact with Patient 03/23/24 1617     (approximate)   History   No chief complaint on file.   HPI  Stacy Cook is a 88 y.o. 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  The patient reports that she just lost her balance and fell both times.  She reports that she intermittently becomes dizzy but that this did not happen before her falls.  She denies any loss of consciousness, head pain, neck pain, back pain, abdominal pain, or hip pain.  She was noted to have a knot to the back of her head by EMS.  She reports she was on the ground for approximately 10 minutes before help arrived.  She also reports pain to her right elbow.     Physical Exam   Triage Vital Signs: ED Triage Vitals [03/23/24 1617]  Encounter Vitals Group     BP 121/80     Girls Systolic BP Percentile      Girls Diastolic BP Percentile      Boys Systolic BP Percentile      Boys Diastolic BP Percentile      Pulse Rate (!) 115     Resp 18     Temp 98 F (36.7 C)     Temp Source Oral     SpO2 98 %     Weight 90 lb 6.2 oz (41 kg)     Height 5' 1 (1.549 m)     Head Circumference      Peak Flow      Pain Score 0     Pain Loc      Pain Education      Exclude from Growth Chart     Most recent vital signs: Vitals:   03/23/24 1617  BP: 121/80  Pulse: (!) 115  Resp: 18  Temp: 98 F (36.7 C)  SpO2: 98%     General: Awake, no distress.  CV:  Good peripheral perfusion.  Resp:  Normal effort.  Abd:  No distention.  Other:  Moderate right parietal scalp contusion  ED Results / Procedures / Treatments   Labs (all labs ordered are listed, but only abnormal results are displayed) Labs Reviewed  COMPREHENSIVE METABOLIC PANEL WITH GFR   CBC  URINALYSIS, ROUTINE W REFLEX MICROSCOPIC     EKG  ED ECG REPORT I, Reche CHRISTELLA Leventhal, the attending physician, personally viewed and interpreted this ECG.  Date: 03/23/2024 Rate: 121 bpm Rhythm: Sinus tachycardia QRS Axis: Left axis deviation Intervals: normal ST/T Wave abnormalities: LVH Narrative Interpretation: no evidence of acute ischemia    RADIOLOGY Patient's chest x-ray was reviewed and interpreted independently by myself as no acute traumatic injury; right-sided pleural effusion that is present on previous imaging  Patient's right elbow x-ray was reviewed and interpreted independently by myself as no acute traumatic findings.  Radiology interpretation adds a small effusion.  Head CT interpreted by radiology as no acute intracranial abnormality with a right parietal scalp contusion.  Cervical spine CT interpreted by radiology as no acute traumatic injury; partially visualized bilateral pleural effusions    PROCEDURES:  Critical Care performed: No  Procedures   MEDICATIONS ORDERED IN ED: Medications - No data to display   IMPRESSION / MDM / ASSESSMENT  AND PLAN / ED COURSE  I reviewed the triage vital signs and the nursing notes.                              Differential diagnosis includes, but is not limited to, ICH, scalp hematoma, elbow dislocation/fracture, electrolyte abnormalities,  Patient's presentation is most consistent with acute presentation with potential threat to life or bodily function.  This patient is an 88 year old female presenting to the emergency department after a fall yesterday and today with a scalp contusion.  She does take a blood thinner.  Head CT and neck CT do not show any acute traumatic findings.  Chest x-ray does not show any acute traumatic findings though does continue to show persistent pleural effusion on the right.  Elbow x-ray does not show any acute findings.  Lab work is overall unremarkable though she does  have a slight increase in her creatinine.  Findings were discussed at length with the patient who states that she would like to be discharged.  I discussed with the patient plan for discharge with instructions to follow-up with her primary care physician within the next few days.  She will return to the emergency department for any new or worsening symptoms.      FINAL CLINICAL IMPRESSION(S) / ED DIAGNOSES   Final diagnoses:  Fall, initial encounter  Scalp hematoma, initial encounter     Rx / DC Orders   ED Discharge Orders     None        Note:  This document was prepared using Dragon voice recognition software and may include unintentional dictation errors.   Wahneta Derocher M, MD 03/23/24 2004

## 2024-03-23 NOTE — ED Triage Notes (Signed)
 Pt presents to the ED via [facility] from home. Pt fell yesterday and again today. Pt has a knot on the back of her head. Pt states that she lost her balance and fell. Pt does take eliquis . Pt denies LOC  127/80 CBG 224 - past hx of DM 96% RA HR 114 - hx of htn and pacemaker

## 2024-03-25 ENCOUNTER — Other Ambulatory Visit: Payer: Self-pay

## 2024-03-30 ENCOUNTER — Emergency Department

## 2024-03-30 ENCOUNTER — Other Ambulatory Visit: Payer: Self-pay

## 2024-03-30 ENCOUNTER — Telehealth: Payer: Self-pay | Admitting: Pharmacy Technician

## 2024-03-30 ENCOUNTER — Emergency Department
Admission: EM | Admit: 2024-03-30 | Discharge: 2024-03-30 | Disposition: A | Discharge status: Home / Self Care | Attending: Emergency Medicine | Admitting: Emergency Medicine

## 2024-03-30 DIAGNOSIS — Z85118 Personal history of other malignant neoplasm of bronchus and lung: Secondary | ICD-10-CM | POA: Insufficient documentation

## 2024-03-30 DIAGNOSIS — K5900 Constipation, unspecified: Secondary | ICD-10-CM | POA: Diagnosis present

## 2024-03-30 MED ORDER — POLYETHYLENE GLYCOL 3350 17 G PO PACK: 17.0000 g | PACK | Freq: Every day | ORAL | 0 refills | Status: DC

## 2024-03-30 MED ORDER — MAGNESIUM CITRATE PO SOLN
1.0000 | Freq: Once | ORAL | Status: AC
  Administered 2024-03-30: 1 via ORAL
  Filled 2024-03-30: qty 296

## 2024-03-30 MED ORDER — DOCUSATE SODIUM 100 MG PO CAPS: 100.0000 mg | ORAL_CAPSULE | Freq: Two times a day (BID) | ORAL | 2 refills | Status: DC

## 2024-03-30 MED ORDER — GLYCERIN (LAXATIVE) 2 G RE SUPP
1.0000 | Freq: Once | RECTAL | Status: AC
  Administered 2024-03-30: 1 via RECTAL
  Filled 2024-03-30: qty 1

## 2024-03-30 MED ORDER — MINERAL OIL RE ENEM
1.0000 | ENEMA | Freq: Once | RECTAL | Status: AC
  Administered 2024-03-30: 1 via RECTAL

## 2024-03-30 NOTE — ED Triage Notes (Signed)
 Pt c/o constipation x4 days. Pt reports she has strained and it is painful and has been unsuccessful. Pt says she has taken laxatives, suppositories and stool softeners. Pt tried to disimpact herself but couldn't reach stool.

## 2024-03-30 NOTE — ED Provider Notes (Signed)
 "   [facility] Emergency Department Provider Note     Event Date/Time   First MD Initiated Contact with Patient 03/30/24 1749     (approximate)   History   Constipation   HPI  Stacy Cook is a 88 y.o. female presents to the ED with complaint of constipation. LBM x 4 days. Patient reports chronic black, tarry stools since starting chemotherapy years ago, unchanged from baseline. Denies abdominal pain, vomiting or urinary symptoms. She reports she tried to disimpact herself at home without relief.   PMHx of malignant neoplasm of lung     Physical Exam   Triage Vital Signs: ED Triage Vitals  Encounter Vitals Group     BP 03/30/24 1723 (!) 131/102     Girls Systolic BP Percentile --      Girls Diastolic BP Percentile --      Boys Systolic BP Percentile --      Boys Diastolic BP Percentile --      Pulse Rate 03/30/24 1723 72     Resp 03/30/24 1723 18     Temp 03/30/24 1723 97.9 F (36.6 C)     Temp Source 03/30/24 1723 Oral     SpO2 03/30/24 1723 95 %     Weight 03/30/24 1724 90 lb (40.8 kg)     Height 03/30/24 1724 5' 1 (1.549 m)     Head Circumference --      Peak Flow --      Pain Score 03/30/24 1723 7     Pain Loc --      Pain Education --      Exclude from Growth Chart --     Most recent vital signs: Vitals:   03/30/24 1723 03/30/24 2233  BP: (!) 131/102 (!) 158/75  Pulse: 72 93  Resp: 18 17  Temp: 97.9 F (36.6 C) 98.3 F (36.8 C)  SpO2: 95% 98%   General Awake, no distress.  HEENT NCAT.  CV:  Good peripheral perfusion.  RESP:  Normal effort.  ABD:  No distention. Soft nontender Other:  Using a gloved finger with KY jelly, I manually disimpacted a moderate amount of hard feculent material from the rectum. Patient tolerated the procedure well.   ED Results / Procedures / Treatments   Labs (all labs ordered are listed, but only abnormal results are displayed) Labs Reviewed - No data to display  RADIOLOGY  I  personally viewed and evaluated these images as part of my medical decision making, as well as reviewing the written report by the radiologist.  ED Provider Interpretation: No bowel obstruction  DG Abd Portable 2 Views Result Date: 03/30/2024 CLINICAL DATA:  Constipation. EXAM: PORTABLE ABDOMEN - 2 VIEW COMPARISON:  Radiograph 09/06/2020 FINDINGS: No gaseous bowel distension to suggest obstruction. Small volume of formed stool in the rectum. There is otherwise a paucity of formed stool throughout the colon. Air scattered throughout nondilated small and large bowel. Enteric sutures in the upper abdomen. Aorto bi-iliac atherosclerosis. Bilateral pleural effusions at least moderate in size with bibasilar opacities. TAVR. Lead less pacemaker. The bones are subjectively under mineralized. IMPRESSION: 1. Small volume of formed stool in the rectum. Otherwise paucity of formed stool throughout the colon. No bowel obstruction. 2. Bilateral pleural effusions at least moderate in size with bibasilar opacities. Electronically Signed   By: Andrea Gasman M.D.   On: 03/30/2024 18:15    PROCEDURES:  Critical Care performed: No  Procedures   MEDICATIONS ORDERED IN ED:  Medications  Glycerin  (Adult) 2 g suppository 1 suppository (1 suppository Rectal Given by Other 03/30/24 2018)  mineral oil enema 1 enema (1 enema Rectal Given 03/30/24 2021)  magnesium  citrate solution 1 Bottle (1 Bottle Oral Given 03/30/24 2230)     IMPRESSION / MDM / ASSESSMENT AND PLAN / ED COURSE  I reviewed the triage vital signs and the nursing notes.                              Clinical Course as of 03/30/24 2346  Mon Mar 30, 2024  2121 Disimpaction performed again. Was able to break up stool burden. Small amount removed with finger swipe. Will reassess  [MH]    Clinical Course User Index [MH] Margrette Monte A, PA-C    88 y.o. female presents to the emergency department for evaluation and treatment of constipation. See  HPI for further details.   Differential diagnosis includes, but is not limited to constipation, fecal impaction, bowel obstruction   Patient's presentation is most consistent with acute complicated illness / injury requiring diagnostic workup.  Patient is well-appearing and well-hydrated with a benign abdominal exam.  Clinical picture consistent with constipation.  X-rays show small volume of formed stool in rectum. No bowel obstruction. A suppository and mineral oil enema was administered in the ED. Patient did not have successful bowel movement. Symptomatic management discussed including increasing fiber in diet and MiraLAX . The patient is instructed to take 1 capful every hour until next bowel movement in 8 ounces to 16 ounce glass of water.  Patient given bottle of magnesium  citrate at discharge and instructed to take small sips of a half a bottle when tomorrow morning. Patient was given verbal and written instructions on symptoms that necessitate return to the ED. He is instructed to follow-up with primary care provider in 1 week.  He is in stable condition for discharge home.  ED return precaution discussed.  FINAL CLINICAL IMPRESSION(S) / ED DIAGNOSES   Final diagnoses:  Constipation, unspecified constipation type   Rx / DC Orders   ED Discharge Orders          Ordered    polyethylene glycol (MIRALAX ) 17 g packet  Daily        03/30/24 2208    docusate sodium  (COLACE) 100 MG capsule  2 times daily        03/30/24 2208             Note:  This document was prepared using Dragon voice recognition software and may include unintentional dictation errors.    Margrette Monte A, PA-C 03/30/24 2347  "

## 2024-03-30 NOTE — Telephone Encounter (Addendum)
 Oral Oncology Patient Advocate Encounter   Was successful in securing patient a $6000 grant from [employer] ([employer]) to provide copayment coverage for Tagrisso .  This will keep the out of pocket expense at $0.     The billing information is as follows and has been shared with [facility].   RxBin: [id number] PCN:  [id number] Member ID: [id number] Group ID: [id number] Dates of Eligibility: 10/02/2023 through 01/28/2025    Barbette (Patty) Chet Burnet, CPhT  [facility], [place] [place], [facility] - Oral Chemotherapy Patient Advocate Specialist III Phone: [phone]  Fax: [phone]

## 2024-03-30 NOTE — Discharge Instructions (Addendum)
 You were evaluated in the ED for constipation.  Your physical exam findings are overall reassuring.  Use MiraLAX  as directed on the package, once daily.  You can mix this powder with 4 to 8 ounces of water or juice every hour until your next bowel movement.  You were provided with magnesium  citrate.  Take small sips of half the bottle tomorrow morning if you have not had a bowel movement, wait 4 to 6 hours to see if you have a bowel movement.  If you do not have a bowel movement by tomorrow morning, take the remaining half bottle.  Do not take more than 1 full bottle in 24 hours.  Follow-up with your primary care provider.  Return to the ED for any symptoms discussed including abdominal pain, vomiting or blood in stool.

## 2024-03-31 ENCOUNTER — Other Ambulatory Visit (INDEPENDENT_AMBULATORY_CARE_PROVIDER_SITE_OTHER): Payer: Self-pay | Admitting: Vascular Surgery

## 2024-03-31 DIAGNOSIS — I6523 Occlusion and stenosis of bilateral carotid arteries: Secondary | ICD-10-CM

## 2024-03-31 DIAGNOSIS — I739 Peripheral vascular disease, unspecified: Secondary | ICD-10-CM

## 2024-04-03 ENCOUNTER — Ambulatory Visit (INDEPENDENT_AMBULATORY_CARE_PROVIDER_SITE_OTHER): Payer: Self-pay

## 2024-04-03 DIAGNOSIS — I442 Atrioventricular block, complete: Secondary | ICD-10-CM | POA: Diagnosis not present

## 2024-04-06 LAB — CUP PACEART REMOTE DEVICE CHECK
Battery Remaining Longevity: 27 mo
Battery Voltage: 2.91 V
Brady Statistic AS VP Percent: 96.56 %
Brady Statistic AS VS Percent: 0.02 %
Brady Statistic RV Percent Paced: 99.63 %
Date Time Interrogation Session: 20251229131300
Implantable Pulse Generator Implant Date: 20200901
Lead Channel Impedance Value: 510 Ohm
Lead Channel Pacing Threshold Amplitude: 0.75 V
Lead Channel Pacing Threshold Pulse Width: 0.24 ms
Lead Channel Sensing Intrinsic Amplitude: 8.1 mV
Lead Channel Setting Pacing Amplitude: 1.375
Lead Channel Setting Pacing Pulse Width: 0.24 ms
Lead Channel Setting Sensing Sensitivity: 2.8 mV

## 2024-04-07 ENCOUNTER — Other Ambulatory Visit (HOSPITAL_COMMUNITY): Payer: Self-pay

## 2024-04-07 ENCOUNTER — Inpatient Hospital Stay

## 2024-04-07 ENCOUNTER — Encounter: Payer: Self-pay | Admitting: Internal Medicine

## 2024-04-07 ENCOUNTER — Inpatient Hospital Stay (HOSPITAL_BASED_OUTPATIENT_CLINIC_OR_DEPARTMENT_OTHER): Admitting: Internal Medicine

## 2024-04-07 ENCOUNTER — Other Ambulatory Visit: Payer: Self-pay

## 2024-04-07 ENCOUNTER — Other Ambulatory Visit: Payer: Self-pay | Admitting: Pharmacy Technician

## 2024-04-07 VITALS — BP 141/65 | HR 99 | Temp 98.6°F | Resp 17 | Ht 61.0 in | Wt 85.1 lb

## 2024-04-07 DIAGNOSIS — C3431 Malignant neoplasm of lower lobe, right bronchus or lung: Secondary | ICD-10-CM | POA: Diagnosis not present

## 2024-04-07 DIAGNOSIS — Z5112 Encounter for antineoplastic immunotherapy: Secondary | ICD-10-CM | POA: Diagnosis not present

## 2024-04-07 LAB — CBC WITH DIFFERENTIAL (CANCER CENTER ONLY)
Abs Immature Granulocytes: 0.06 K/uL (ref 0.00–0.07)
Basophils Absolute: 0 K/uL (ref 0.0–0.1)
Basophils Relative: 0 %
Eosinophils Absolute: 0 K/uL (ref 0.0–0.5)
Eosinophils Relative: 1 %
HCT: 37 % (ref 36.0–46.0)
Hemoglobin: 12.3 g/dL (ref 12.0–15.0)
Immature Granulocytes: 1 %
Lymphocytes Relative: 7 %
Lymphs Abs: 0.5 K/uL — ABNORMAL LOW (ref 0.7–4.0)
MCH: 31 pg (ref 26.0–34.0)
MCHC: 33.2 g/dL (ref 30.0–36.0)
MCV: 93.2 fL (ref 80.0–100.0)
Monocytes Absolute: 0.6 K/uL (ref 0.1–1.0)
Monocytes Relative: 7 %
Neutro Abs: 6.4 K/uL (ref 1.7–7.7)
Neutrophils Relative %: 84 %
Platelet Count: 155 K/uL (ref 150–400)
RBC: 3.97 MIL/uL (ref 3.87–5.11)
RDW: 13.6 % (ref 11.5–15.5)
WBC Count: 7.6 K/uL (ref 4.0–10.5)
nRBC: 0 % (ref 0.0–0.2)

## 2024-04-07 LAB — CMP (CANCER CENTER ONLY)
ALT: <5 U/L (ref 0–44)
AST: 16 U/L (ref 15–41)
Albumin: 3.3 g/dL — ABNORMAL LOW (ref 3.5–5.0)
Alkaline Phosphatase: 175 U/L — ABNORMAL HIGH (ref 38–126)
Anion gap: 16 — ABNORMAL HIGH (ref 5–15)
BUN: 37 mg/dL — ABNORMAL HIGH (ref 8–23)
CO2: 24 mmol/L (ref 22–32)
Calcium: 10 mg/dL (ref 8.9–10.3)
Chloride: 101 mmol/L (ref 98–111)
Creatinine: 1.12 mg/dL — ABNORMAL HIGH (ref 0.44–1.00)
GFR, Estimated: 48 mL/min — ABNORMAL LOW
Glucose, Bld: 106 mg/dL — ABNORMAL HIGH (ref 70–99)
Potassium: 4.3 mmol/L (ref 3.5–5.1)
Sodium: 141 mmol/L (ref 135–145)
Total Bilirubin: 1.5 mg/dL — ABNORMAL HIGH (ref 0.0–1.2)
Total Protein: 6.3 g/dL — ABNORMAL LOW (ref 6.5–8.1)

## 2024-04-07 MED ORDER — TRAMADOL HCL 50 MG PO TABS: 50.0000 mg | ORAL_TABLET | Freq: Four times a day (QID) | ORAL | 0 refills | Status: DC | PRN

## 2024-04-07 NOTE — Progress Notes (Signed)
 [facility] OFFICE PROGRESS NOTE  Patient Care Team: Stacy Ophelia JINNY DOUGLAS, MD as PCP - General (Internal Medicine) Stacy Ole DASEN, MD as PCP - Electrophysiology (Cardiology) Rennie Stacy SAUNDERS, MD as Consulting Physician (Oncology) Stacy Ladell POUR, MD as Consulting Physician (Gastroenterology) Stacy Ole DASEN, MD as Consulting Physician (Cardiology)   Cancer Staging  Primary malignant neoplasm of right lower lobe of lung [facility]) Staging form: Lung, AJCC 7th Edition - Clinical: No stage assigned - Unsigned    Oncology History Overview Note  # DEC 2017- Adeno ca [GATA; her 2 Neu-NEG]; signet ring [1.5 x3 mm gastric incisura; Dr.Skulskie]; EUS [Dr.Burnbridge]; no significant abnormality noted;  Reviewed at [facility] also. JAN 2018- PET NED. April 2018- S/p partial gastrectomy [Dr.Sankar]- STAGE I ADENO CA; NO adjuvant therapy.  # STAGE I CARCINOID s/p partial gastrectomy   # May 2018- Chronic Atrophic gastritis- Prevpac   # FEB 2017- ADENOCARCINOMA with Lepidic 80%-20% acinar pattern; pT2a [Stage IB;T-2.3cm; visceral pleural invasion present; pN=0 ]; AUG 2017- CT NED;   # DEC 2019- RECURRENT/STAGE IV ADENO LUNG CA- EGFR MUTATED;# Jan 6th 2020-; START Osidemrtinib;  # AUG 2020- SEVERE AS [s/p TAVR; [place]]-complicated by R sided stroke/ seizures/acute renal failure.   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 [omniseq]  # Palliative: O-1/20   DIAGNOSIS:  #ADENO CA LUNG-STAGE IV #Stomach adeno ca [stage I; dec 2017]  GOALS: pallaitive  CURRENT/MOST RECENT THERAPY - OSIMERTINIB  [Jan 6th 2020]      Primary malignant neoplasm of right lower lobe of lung (HCC)  12/02/2015 Initial Diagnosis   Primary malignant neoplasm of right lower lobe of lung (HCC)   03/16/2024 -  Chemotherapy   Patient is on Treatment Plan : lung cancer  Datopotamab Deruxtecan-dlnk  (Datroway ) q21d     Adenocarcinoma of gastric cardia (HCC)    INTERVAL HISTORY: Patient is alone. She is in a wheelchair.  Stacy Cook 88 y.o.  female pleasant patient above history of recurrent/stage IV adenocarcinoma lung/EGFR mutated currently on osimertinib ; ITP on promacta ; A.fib on eliquis  on chemotherapy is here for a follow up.  Discussed the use of AI scribe software for clinical note transcription with the patient, who gave verbal consent to proceed.  History of Present Illness   Stacy Cook is an 88 year old female who presents for follow-up regarding Lung cancer diagnosis.  Discussed the use of AI scribe software for clinical note transcription with the patient, who gave verbal consent to proceed.  History of Present Illness   Stacy Cook is an 88 year old female with advanced non-small cell lung cancer who presents for oncology follow-up due to progressive functional decline and refractory cancer-related pain.  She has non-small cell lung cancer of the right lower lobe and is currently receiving chemotherapy. Osimertinib  was previously discontinued due to lack of benefit. She reports marked deterioration in her condition over recent visits, including progressive weight loss, worsening physical debility, and inability to perform daily activities. She required assistance from family and emergency services to leave her home for today's appointment.  She describes persistent, refractory pain that has not responded to acetaminophen  and for which she has not previously received stronger analgesics. Her discomfort is constant and unrelieved by changes in position. She also reports ongoing constipation and significant insomnia.  She expresses emotional distress related to her poor quality of life, stating she is never comfortable and that her quality of life is gone.  She has a do not resuscitate order and has discussed her wishes for hospice care with her family, who are supportive and involved in her care. She has  prior experience with in-home hospice through her late husband and expresses concern about the impact of her illness on her family, desiring to avoid further suffering or hospitalization.  She recently saw an ophthalmologist for evaluation of possible cancer involvement, but was reassured that there was no significant issue. Her mental status remains mostly intact, and she is able to participate in decision-making regarding her care. She has discontinued some of her other medications recently, and her blood work shows chronic mild abnormalities without evidence of acute crisis.        Review of Systems  Constitutional:  Positive for malaise/fatigue. Negative for chills, diaphoresis and fever.  HENT:  Negative for nosebleeds and sore throat.   Eyes:  Negative for double vision.  Respiratory:  Negative for hemoptysis, sputum production and wheezing.   Cardiovascular:  Negative for chest pain, palpitations, orthopnea and leg swelling.  Gastrointestinal:  Negative for blood in stool, constipation, melena, nausea and vomiting.  Genitourinary:  Negative for dysuria, frequency and urgency.  Musculoskeletal:  Positive for back pain and joint pain.  Neurological:  Positive for focal weakness. Negative for dizziness, tingling, weakness and headaches.  Endo/Heme/Allergies:  Bruises/bleeds easily.  Psychiatric/Behavioral:  Negative for depression. The patient is not nervous/anxious and does not have insomnia.       PAST MEDICAL HISTORY :  Past Medical History:  Diagnosis Date   (HFpEF) heart failure with preserved ejection fraction (HCC)    Abdominal pain    Allergic genetic state    Anxiety    Arthritis    Asthma    Atrial fibrillation (HCC)    B12 deficiency    Basal cell carcinoma 01/01/2022   glabella, [facility]   Basal cell carcinoma 05/03/2022   R ant deltoid, [facility]   Basal cell carcinoma 05/03/2022   Xyphoid excised 06/12/2022   Bile reflux gastritis    CAD (coronary artery disease)    Cancer  of right lung (HCC) 05/2015   a.) pT2a [Stage IB;T-2.3cm; visceral pleural invasion present; pN=0]; adenocarcinoma with Lepidic 80%-20% acinar pattern;  b.) s/p RLL lobectomy in 2017; c.) recurrent stage IV in 2019 - started osimertinib  in 04/2018   Cardiac asystole (HCC) 12/09/2018   Carotid stenosis 02/07/2016   Cataract cortical, senile    Chest pain    CKD (chronic kidney disease), stage III (HCC)    Colon polyp    Degeneration of intervertebral disc of lumbar region 03/11/2014   Embolic stroke (HCC) 12/05/2018   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   a.) stage I - pathology (+) for diffuse, poorly differentiated, signet ring; b.) s/p partial gastrectomy   GERD (gastroesophageal reflux disease)    Heart block 12/10/2018   History of kidney stones    HLD (hyperlipidemia) 08/24/2013   Hypertension    IDA (iron  deficiency anemia)    Neuritis or radiculitis due to rupture of lumbar intervertebral disc 09/10/2014   Neuroendocrine tumor (HCC) 03/24/2015   Neuroma    On apixaban  therapy    Osteoporosis    Peripheral vascular disease    PONV (postoperative nausea and vomiting)    Presence of permanent cardiac pacemaker 12/09/2018   Pulmonary hypertension (HCC)    Right hemiparesis (HCC) 01/22/2019   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   a.) 23 mm MDT Evolut Pro + transcatheter valve (SN: [id number])  Sciatica    Senile purpura 10/02/2019   Severe aortic stenosis    a.) s/p TAVR 12/02/2018   Skin cancer    cut/burned LUE; cut off right eye/nose & cut off chest (10/15/2017)   Stomach ulcer    Thrombocytopenia    a.) Tx'd with eltrombopag    Type 2 diabetes, diet controlled (HCC)     PAST SURGICAL HISTORY :   Past Surgical History:  Procedure Laterality Date   APPENDECTOMY     CARDIAC CATHETERIZATION  X2 before 10/15/2017   CARDIAC VALVE REPLACEMENT     CATARACT EXTRACTION W/PHACO Left 04/04/2017   Procedure: CATARACT EXTRACTION PHACO AND INTRAOCULAR LENS  PLACEMENT (IOC);  Surgeon: Mittie Gaskin, MD;  Location: [facility] ORS;  Service: Ophthalmology;  Laterality: Left;  Lot # [id number] H US  1:00 Ap 25% CDE 8.54   CATARACT EXTRACTION W/PHACO Right 05/15/2017   Procedure: CATARACT EXTRACTION PHACO AND INTRAOCULAR LENS PLACEMENT (IOC);  Surgeon: Mittie Gaskin, MD;  Location: [facility] ORS;  Service: Ophthalmology;  Laterality: Right;  US  01:10 AP% 18.3 CDE 12.91 Fluid pack lot # [id number]   COLONOSCOPY     CORONARY ATHERECTOMY N/A 10/15/2017   Procedure: CORONARY ATHERECTOMY;  Surgeon: Verlin Lonni BIRCH, MD;  Location: [facility];  Service: Cardiovascular;  Laterality: N/A;   CORONARY STENT INTERVENTION N/A 10/15/2017   Procedure: CORONARY STENT INTERVENTION;  Surgeon: Verlin Lonni BIRCH, MD;  Location: [facility];  Service: Cardiovascular;  Laterality: N/A;   CYSTOSCOPY/URETEROSCOPY/HOLMIUM LASER/STENT PLACEMENT Left 08/03/2019   Procedure: CYSTOSCOPY/URETEROSCOPY/HOLMIUM LASER/STENT PLACEMENT;  Surgeon: Penne Knee, MD;  Location: [facility] ORS;  Service: Urology;  Laterality: Left;   ESOPHAGOGASTRODUODENOSCOPY     ESOPHAGOGASTRODUODENOSCOPY (EGD) WITH PROPOFOL  N/A 03/27/2016   Procedure: ESOPHAGOGASTRODUODENOSCOPY (EGD) WITH PROPOFOL ;  Surgeon: Gladis RAYMOND Mariner, MD;  Location: [facility] ENDOSCOPY;  Service: Endoscopy;  Laterality: N/A;   ESOPHAGOGASTRODUODENOSCOPY (EGD) WITH PROPOFOL  N/A 05/28/2016   Procedure: ESOPHAGOGASTRODUODENOSCOPY (EGD) WITH PROPOFOL ;  Surgeon: Gladis RAYMOND Mariner, MD;  Location: [facility] ENDOSCOPY;  Service: Endoscopy;  Laterality: N/A;   ESOPHAGOGASTRODUODENOSCOPY (EGD) WITH PROPOFOL  N/A 09/25/2016   Procedure: ESOPHAGOGASTRODUODENOSCOPY (EGD) WITH PROPOFOL ;  Surgeon: Dellie Louanne MATSU, MD;  Location: [facility] ENDOSCOPY;  Service: Endoscopy;  Laterality: N/A;   ESOPHAGOGASTRODUODENOSCOPY (EGD) WITH PROPOFOL  N/A 12/18/2016   Procedure: ESOPHAGOGASTRODUODENOSCOPY (EGD) WITH PROPOFOL ;  Surgeon: Dellie Louanne MATSU, MD;  Location: [facility] ENDOSCOPY;  Service: Endoscopy;  Laterality: N/A;   ESOPHAGOGASTRODUODENOSCOPY (EGD) WITH PROPOFOL  N/A 01/23/2017   Procedure: ESOPHAGOGASTRODUODENOSCOPY (EGD) WITH PROPOFOL ;  Surgeon: Dellie Louanne MATSU, MD;  Location: [facility] ENDOSCOPY;  Service: Endoscopy;  Laterality: N/A;   ESOPHAGOGASTRODUODENOSCOPY (EGD) WITH PROPOFOL  N/A 03/20/2017   Procedure: ESOPHAGOGASTRODUODENOSCOPY (EGD) WITH PROPOFOL ;  Surgeon: Dellie Louanne MATSU, MD;  Location: [facility] ENDOSCOPY;  Service: Endoscopy;  Laterality: N/A;   ESOPHAGOGASTRODUODENOSCOPY (EGD) WITH PROPOFOL  N/A 09/23/2020   Procedure: ESOPHAGOGASTRODUODENOSCOPY (EGD) WITH PROPOFOL ;  Surgeon: Maryruth Ole DASEN, MD;  Location: [facility] ENDOSCOPY;  Service: Endoscopy;  Laterality: N/A;  KC SAYS AMPICILLIN IS NEEDED   FRACTURE SURGERY     INSERT / REPLACE / REMOVE PACEMAKER  12/09/2018   PACEMAKER LEADLESS INSERTION N/A 12/09/2018   Procedure: PACEMAKER LEADLESS INSERTION;  Surgeon: Kelsie Agent, MD;  Location: [facility];  Service: Cardiovascular;  Laterality: N/A;   PARTIAL GASTRECTOMY N/A 08/03/2016   Hemigastrectomy, Billroth I reconstruction Surgeon: Louanne MATSU Dellie, MD;  Location: [facility] ORS;  Service: General;  Laterality: N/A;   RIGHT/LEFT HEART CATH AND CORONARY ANGIOGRAPHY Bilateral 09/19/2017   Procedure: RIGHT/LEFT HEART CATH AND CORONARY ANGIOGRAPHY;  Surgeon: Florencio Cara BIRCH, MD;  Location: [facility] INVASIVE CV LAB;  Service: Cardiovascular;  Laterality: Bilateral;   SHOULDER ARTHROSCOPY W/ CAPSULAR REPAIR Right    SKIN CANCER EXCISION     cut/burned LUE; cut off right eye/nose & cut off chest (10/15/2017)   TEE WITHOUT CARDIOVERSION N/A 12/02/2018   Procedure: TRANSESOPHAGEAL ECHOCARDIOGRAM (TEE);  Surgeon: Verlin Lonni BIRCH, MD;  Location: [facility] INVASIVE CV LAB;  Service: Open Heart Surgery;  Laterality: N/A;   THORACOTOMY Right 05/09/2015   Procedure: THORACOTOMY, RIGHT LOWER LOBECTOMY,  BRONCHOSCOPY;  Surgeon: Evalene Glasser, MD;  Location: [facility] ORS;  Service: Thoracic;  Laterality: Right;   TONSILLECTOMY  1944   TRANSCATHETER AORTIC VALVE REPLACEMENT, TRANSFEMORAL N/A 12/02/2018   Procedure: TRANSCATHETER AORTIC VALVE REPLACEMENT, TRANSFEMORAL;  Surgeon: Verlin Lonni BIRCH, MD;  Location: [facility];  Service: Open Heart Surgery;  Laterality: N/A;   TUBAL LIGATION     UPPER GI ENDOSCOPY N/A 08/03/2016   Procedure: UPPER  ENDOSCOPY;  Surgeon: Louanne KANDICE Muse, MD;  Location: [facility] ORS;  Service: General;  Laterality: N/A;   VAGINAL HYSTERECTOMY     WRIST FRACTURE SURGERY Right     FAMILY HISTORY :   Family History  Problem Relation Age of Onset   Aortic aneurysm Mother    Kidney Stones Mother    Hypertension Mother    Hypertension Father    Diabetes Other    Breast cancer Neg Hx    Prostate cancer Neg Hx    Bladder Cancer Neg Hx    Kidney cancer Neg Hx     SOCIAL HISTORY:   Social History   Tobacco Use   Smoking status: Never   Smokeless tobacco: Never  Vaping Use   Vaping status: Never Used  Substance Use Topics   Alcohol  use: Not Currently   Drug use: Never    ALLERGIES:  is allergic to mirtazapine, pneumococcal vaccine, statins, and sulfa antibiotics.  MEDICATIONS:  Current Outpatient Medications  Medication Sig Dispense Refill   apixaban  (ELIQUIS ) 2.5 MG TABS tablet Take 1 tablet (2.5 mg total) by mouth 2 (two) times daily. 180 tablet 1   ciprofloxacin  (CILOXAN ) 0.3 % ophthalmic solution INSTILL 1 DROP INTO LEFT EYE FIVE TIMES DAILY FOR 5 DAYS     dexamethasone  (DECADRON ) 0.5 MG/5ML solution Swish 10 mL (1 mg) by mouth for 2 minutes, then spit. Repeat 4 times daily. Start on 1st day of datopotamab deruxtecan. 500 mL 4   dexamethasone  (DECADRON ) 4 MG tablet Take 2 tablets (8 mg) by mouth daily for 3 days starting the day after chemotherapy. Take with food. 30 tablet 1   docusate sodium  (COLACE) 100 MG capsule Take 1 capsule (100 mg  total) by mouth 2 (two) times daily. 60 capsule 2   eltrombopag  (PROMACTA ) 25 MG tablet Take 1 tablet (25 mg total) by mouth daily. Take on an empty stomach, 1 hour before a meal or 2 hours after. 30 tablet 2   ezetimibe  (ZETIA ) 10 MG tablet Take 1 tablet (10 mg total) by mouth daily. 90 tablet 3   ferrous sulfate  325 (65 FE) MG tablet Take 325 mg by mouth daily with breakfast.     ipratropium (ATROVENT) 0.06 % nasal spray Place 2 sprays into both nostrils 2 (two) times daily.     isosorbide  mononitrate (IMDUR ) 30 MG 24 hr tablet Take 1 tablet (30 mg total) by mouth daily. 90 tablet 3   Magnesium  100 MG TABS Take 1 tablet by mouth daily at 6 (  six) AM.     metoprolol  succinate (TOPROL -XL) 50 MG 24 hr tablet Take 1 tablet (50 mg total) by mouth every evening. Take with or immediately following a meal. 30 tablet 0   ondansetron  (ZOFRAN ) 8 MG tablet Take 1 tablet (8 mg total) by mouth every 8 (eight) hours as needed for nausea or vomiting. Start on the third day after chemotherapy. 30 tablet 1   polyethylene glycol (MIRALAX ) 17 g packet Take 17 g by mouth daily. 14 each 0   potassium citrate  (UROCIT-K ) 10 MEQ (1080 MG) SR tablet Take 1 tablet by mouth once daily 90 tablet 0   prochlorperazine  (COMPAZINE ) 10 MG tablet Take 1 tablet (10 mg total) by mouth every 6 (six) hours as needed for nausea or vomiting. 30 tablet 1   traMADol  (ULTRAM ) 50 MG tablet Take 1 tablet (50 mg total) by mouth every 6 (six) hours as needed. 45 tablet 0   Carboxymethylcell-Glycerin  PF 0.5-0.9 % SOLN Place 2 drops into both eyes 4 (four) times daily. And as needed. (Patient not taking: Reported on 04/07/2024) 1 each 11   cholecalciferol  (VITAMIN D ) 1000 UNITS tablet Take 1,000 Units by mouth daily. (Patient not taking: Reported on 04/07/2024)     cyanocobalamin  1000 MCG tablet Take 1,000 mcg by mouth daily.  (Patient not taking: Reported on 04/07/2024)     No current facility-administered medications for this visit.     PHYSICAL EXAMINATION: ECOG PERFORMANCE STATUS: 0 - Asymptomatic  BP (!) 141/65   Pulse 99   Temp 98.6 F (37 C)   Resp 17   Ht 5' 1 (1.549 m)   Wt 85 lb 1.6 oz (38.6 kg)   SpO2 97%   BMI 16.08 kg/m   Filed Weights   04/07/24 1336  Weight: 85 lb 1.6 oz (38.6 kg)    Decreased breath sounds on the right side compared to left.  Physical Exam HENT:     Head: Normocephalic and atraumatic.     Mouth/Throat:     Pharynx: No oropharyngeal exudate.  Eyes:     Pupils: Pupils are equal, round, and reactive to light.  Cardiovascular:     Rate and Rhythm: Normal rate and regular rhythm.     Heart sounds: Murmur heard.  Pulmonary:     Effort: No respiratory distress.     Breath sounds: No wheezing.  Abdominal:     General: Bowel sounds are normal. There is no distension.     Palpations: Abdomen is soft. There is no mass.     Tenderness: There is no abdominal tenderness. There is no guarding or rebound.  Musculoskeletal:        General: No tenderness. Normal range of motion.     Cervical back: Normal range of motion and neck supple.  Skin:    General: Skin is warm.  Neurological:     Mental Status: She is alert and oriented to person, place, and time.     Comments: Weakness of the right upper extremity more than lower extremity.  Psychiatric:        Mood and Affect: Affect normal.    LABORATORY DATA:  I have reviewed the data as listed    Component Value Date/Time   NA 141 04/07/2024 1316   NA 141 02/05/2022 1405   NA 141 07/21/2012 1529   K 4.3 04/07/2024 1316   K 3.9 07/21/2012 1529   CL 101 04/07/2024 1316   CL 109 (H) 07/21/2012 1529   CO2 24 04/07/2024  1316   CO2 27 07/21/2012 1529   GLUCOSE 106 (H) 04/07/2024 1316   GLUCOSE 104 (H) 07/21/2012 1529   BUN 37 (H) 04/07/2024 1316   BUN 34 (H) 02/05/2022 1405   BUN 16 07/21/2012 1529   CREATININE 1.12 (H) 04/07/2024 1316   CREATININE 1.11 09/03/2012 1535   CALCIUM  10.0 04/07/2024 1316   CALCIUM  8.9  07/21/2012 1529   PROT 6.3 (L) 04/07/2024 1316   PROT 7.1 07/21/2012 1529   ALBUMIN 3.3 (L) 04/07/2024 1316   ALBUMIN 3.9 07/21/2012 1529   AST 16 04/07/2024 1316   ALT <5 04/07/2024 1316   ALT 22 07/21/2012 1529   ALKPHOS 175 (H) 04/07/2024 1316   ALKPHOS 76 07/21/2012 1529   BILITOT 1.5 (H) 04/07/2024 1316   GFRNONAA 48 (L) 04/07/2024 1316   GFRNONAA 49 (L) 09/03/2012 1535   GFRAA 29 (L) 01/05/2020 1440   GFRAA 57 (L) 09/03/2012 1535    No results found for: SPEP, UPEP  Lab Results  Component Value Date   WBC 7.6 04/07/2024   NEUTROABS 6.4 04/07/2024   HGB 12.3 04/07/2024   HCT 37.0 04/07/2024   MCV 93.2 04/07/2024   PLT 155 04/07/2024      Chemistry      Component Value Date/Time   NA 141 04/07/2024 1316   NA 141 02/05/2022 1405   NA 141 07/21/2012 1529   K 4.3 04/07/2024 1316   K 3.9 07/21/2012 1529   CL 101 04/07/2024 1316   CL 109 (H) 07/21/2012 1529   CO2 24 04/07/2024 1316   CO2 27 07/21/2012 1529   BUN 37 (H) 04/07/2024 1316   BUN 34 (H) 02/05/2022 1405   BUN 16 07/21/2012 1529   CREATININE 1.12 (H) 04/07/2024 1316   CREATININE 1.11 09/03/2012 1535      Component Value Date/Time   CALCIUM  10.0 04/07/2024 1316   CALCIUM  8.9 07/21/2012 1529   ALKPHOS 175 (H) 04/07/2024 1316   ALKPHOS 76 07/21/2012 1529   AST 16 04/07/2024 1316   ALT <5 04/07/2024 1316   ALT 22 07/21/2012 1529   BILITOT 1.5 (H) 04/07/2024 1316       RADIOGRAPHIC STUDIES: I have personally reviewed the radiological images as listed and agreed with the findings in the report. CUP PACEART REMOTE DEVICE CHECK Result Date: 04/06/2024 PPM Scheduled remote reviewed. Normal device function.  Presenting rhythm:  AM-VP. Next remote transmission per protocol. - CS, CVRS    ASSESSMENT & PLAN:  Primary malignant neoplasm of right lower lobe of lung (HCC) # Stage IV recurrent adenocarcinoma the lung; EGFR mutated. On osimertinib  80 mg [Feb 6th 2020] #  currently on Osimertinib . PET  scan OCT 2025-Disease progression as evidenced by a new hypermetabolic dominant right middle lobe nodule with additional subpleural hypermetabolic nodules in the right hemithorax. 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 STOP osimertinib . Discussed with Dr. DELENA. Fluid positive for adeno ca.   # s/p  cycle #1 of Dato-Dxd.  Patient tolerated chemotherapy poorly-noted to have progressive weight loss progressive fatigue progressive shortness of breath.  Overall she feels poorly-and with significant decrease in performance status currently around 3-4.  Given patient's preference and also continued decline in performance status-I think is reasonable to discontinue further chemotherapy.  See hospice see discussion below.   #Right pleural effusion-status postthoracentesis-pleural fluid: Transudative; cytology negative for malignancy.  Question CHF/fluid overload.  S/p  Lasix  20 mg a day- improved/stable. Currently not on any lasix  [[facility]]-poorly controlled chest wall pain-Will start patient on tramadol .  # Chronic anemia- Hb 10-18 Dec 2021- Iron  sat- 22%; ferritin- 137- continue Iron  pills once day. Hold off Venofer -stable  # ITP- chronic- platelets> 100 on promacta  25 mg/day continue Promacta  for now discussed with pharmacy.    # A-fib [feb 2024] on Eliquis  2.5/cardiology. STABLE.   # right sided stroke/ seizures-on asprin; off plavix ;; f/u neurology-[place]-  STABLE.   # CKD- stage IV; Left Hydronephrosis s/p ureteral stent placement/nephrolithiasis-~ GFR 34 today; Hypokalemia-3.4  continue K-citrate q day.STABLE.   # IV ACCESS: PIV  # ACP- DNR.   # I had a long discussion with the patient and family given the incurable nature of the disease /poor tolerance of therapy.  I also discussed given general less than 6 months of life  expectancy, I introduced hospice philosophy to the patient and family.    Discussed that goal of care should be directed to symptom management rather than treating the underlying disease; and in the process help improve quality of life rather than quantity.  Discussed with hospice team would include-nurse, nurse aide, social worker and chaplain for help take care of patient with physical/emotional needs.  Patient has been went through hospice program more than 10 years ago.  So they are aware of hospice services.  I also reviewed in cause of emergency/acute cardiorespiratory failure patient could potentially undergo interventions including but not limited to chest compressions, intubation, defibrillation and antiarrhythmic to the right.  However given patient's frail status and incurable malignancy I would  recommend against any aggressive interventions. Patient is currently DNR/DNI.  The above plan of care was discussed with the patient and her son in detail.  They are in agreement.  # DISPOSITION: # HOLD  chemo today-  # hospice referral ASAP- lung cancer # follow up-as needed- Dr.B  # 40 minutes face-to-face with the patient discussing the above plan of care; more than 50% of time spent on prognosis/ natural history; counseling and coordination.        Orders Placed This Encounter  Procedures   Ambulatory referral to Hospice    Referral Priority:   Routine    Referral Type:   Consultation    Referral Reason:   Specialty Services Required    Requested Specialty:   Hospice Services    Number of Visits Requested:   1   All questions were answered. The patient knows to call the clinic with any problems, questions or concerns.      Stacy JONELLE Joe, MD 04/07/2024 6:52 PM

## 2024-04-07 NOTE — Progress Notes (Signed)
 Patient states she is weak, and fatigued. Patient states she has no strength. Patient states this has been going on for the past two weeks. Patient mentioned that she has been having chest pain the past two weeks also. Patient son states she has been going down hill since her fall in October. Patient has not being eating or drinking. Patient states she doesn't want to eat, drink or do anything at all.

## 2024-04-07 NOTE — Assessment & Plan Note (Addendum)
#   Stage IV recurrent adenocarcinoma the lung; EGFR mutated. On osimertinib  80 mg [Feb 6th 2020] #  currently on Osimertinib . PET scan OCT 2025-Disease progression as evidenced by a new hypermetabolic dominant right middle lobe nodule with additional subpleural hypermetabolic nodules in the right hemithorax. 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 STOP osimertinib . Discussed with Dr. DELENA. Fluid positive for adeno ca.   # s/p  cycle #1 of Dato-Dxd.  Patient tolerated chemotherapy poorly-noted to have progressive weight loss progressive fatigue progressive shortness of breath.  Overall she feels poorly-and with significant decrease in performance status currently around 3-4.  Given patient's preference and also continued decline in performance status-I think is reasonable to discontinue further chemotherapy.  See hospice see discussion below.   #Right pleural effusion-status postthoracentesis-pleural fluid: Transudative; cytology negative for malignancy.  Question CHF/fluid overload.  S/p Lasix  20 mg a day- improved/stable. Currently not on any lasix  [ARF]-poorly controlled chest wall pain-Will start patient on tramadol .  # Chronic anemia- Hb 10-18 Dec 2021- Iron  sat- 22%; ferritin- 137- continue Iron  pills once day. Hold off Venofer -stable  # ITP- chronic- platelets> 100 on promacta  25 mg/day continue Promacta  for now discussed with pharmacy.    # A-fib [feb 2024] on Eliquis  2.5/cardiology. STABLE.   # right sided stroke/ seizures-on asprin; off plavix ;; f/u neurology-[place]-  STABLE.   # CKD- stage IV; Left Hydronephrosis s/p ureteral stent placement/nephrolithiasis-~ GFR 34 today; Hypokalemia-3.4  continue K-citrate q day.STABLE.   # IV ACCESS: PIV  # ACP- DNR.   # I had a long discussion with the patient and family given  the incurable nature of the disease /poor tolerance of therapy.  I also discussed given general less than 6 months of life expectancy, I introduced hospice philosophy to the patient and family.    Discussed that goal of care should be directed to symptom management rather than treating the underlying disease; and in the process help improve quality of life rather than quantity.  Discussed with hospice team would include-nurse, nurse aide, social worker and chaplain for help take care of patient with physical/emotional needs.  Patient has been went through hospice program more than 10 years ago.  So they are aware of hospice services.  I also reviewed in cause of emergency/acute cardiorespiratory failure patient could potentially undergo interventions including but not limited to chest compressions, intubation, defibrillation and antiarrhythmic to the right.  However given patient's frail status and incurable malignancy I would  recommend against any aggressive interventions. Patient is currently DNR/DNI.  The above plan of care was discussed with the patient and her son in detail.  They are in agreement.  # DISPOSITION: # HOLD  chemo today-  # hospice referral ASAP- lung cancer # follow up-as needed- Dr.B  # 40 minutes face-to-face with the patient discussing the above plan of care; more than 50% of time spent on prognosis/ natural history; counseling and coordination.

## 2024-04-07 NOTE — Progress Notes (Signed)
 Specialty Pharmacy Refill Coordination Note  Janei Scheff is a 88 y.o. female contacted today regarding refills of specialty medication(s) Eltrombopag  Olamine (PROMACTA )   Patient requested Delivery   Delivery date: 04/10/24   Verified address: [address] [place] [address]   Medication will be filled on: 04/08/24   Erikson Danzy (Patty) Chet Burnet, CPhT  [facility], Zelda Salmon, [facility] - Oral Chemotherapy [facility] Phone: [phone]  Fax: [phone]

## 2024-04-08 ENCOUNTER — Other Ambulatory Visit: Payer: Self-pay

## 2024-04-08 NOTE — Progress Notes (Signed)
 Remote PPM Transmission

## 2024-04-11 ENCOUNTER — Ambulatory Visit: Payer: Self-pay | Admitting: Cardiology

## 2024-04-14 ENCOUNTER — Encounter (INDEPENDENT_AMBULATORY_CARE_PROVIDER_SITE_OTHER)

## 2024-04-14 ENCOUNTER — Ambulatory Visit (INDEPENDENT_AMBULATORY_CARE_PROVIDER_SITE_OTHER): Admitting: Vascular Surgery

## 2024-04-21 ENCOUNTER — Other Ambulatory Visit: Payer: Self-pay | Admitting: Pharmacist

## 2024-04-21 NOTE — Progress Notes (Signed)
 Patient deceased

## 2024-05-10 DEATH — deceased

## 2024-05-11 ENCOUNTER — Encounter: Payer: Self-pay | Admitting: Internal Medicine

## 2024-05-13 ENCOUNTER — Other Ambulatory Visit: Payer: Self-pay

## 2024-05-18 ENCOUNTER — Inpatient Hospital Stay: Admitting: Hospice and Palliative Medicine

## 2024-08-17 ENCOUNTER — Ambulatory Visit: Admitting: Dermatology

## 2024-08-27 ENCOUNTER — Ambulatory Visit: Admitting: Dermatology
